# Patient Record
Sex: Male | Born: 1950 | Race: Black or African American | Hispanic: No | Marital: Married | Smoking: Former smoker
Health system: Southern US, Community
[De-identification: ages and names within clinical notes are randomized; demographics above are authoritative.]

## PROBLEM LIST (undated history)

## (undated) DIAGNOSIS — G4730 Sleep apnea, unspecified: Secondary | ICD-10-CM

## (undated) DIAGNOSIS — I209 Angina pectoris, unspecified: Secondary | ICD-10-CM

## (undated) DIAGNOSIS — Z951 Presence of aortocoronary bypass graft: Secondary | ICD-10-CM

## (undated) DIAGNOSIS — J449 Chronic obstructive pulmonary disease, unspecified: Secondary | ICD-10-CM

## (undated) DIAGNOSIS — I739 Peripheral vascular disease, unspecified: Secondary | ICD-10-CM

## (undated) DIAGNOSIS — N183 Chronic kidney disease, stage 3 unspecified: Secondary | ICD-10-CM

## (undated) DIAGNOSIS — M1990 Unspecified osteoarthritis, unspecified site: Secondary | ICD-10-CM

## (undated) DIAGNOSIS — G4733 Obstructive sleep apnea (adult) (pediatric): Secondary | ICD-10-CM

## (undated) DIAGNOSIS — J9610 Chronic respiratory failure, unspecified whether with hypoxia or hypercapnia: Secondary | ICD-10-CM

## (undated) DIAGNOSIS — N189 Chronic kidney disease, unspecified: Secondary | ICD-10-CM

## (undated) DIAGNOSIS — I509 Heart failure, unspecified: Secondary | ICD-10-CM

## (undated) DIAGNOSIS — I2510 Atherosclerotic heart disease of native coronary artery without angina pectoris: Secondary | ICD-10-CM

## (undated) DIAGNOSIS — G459 Transient cerebral ischemic attack, unspecified: Secondary | ICD-10-CM

## (undated) DIAGNOSIS — D649 Anemia, unspecified: Secondary | ICD-10-CM

## (undated) DIAGNOSIS — Z8739 Personal history of other diseases of the musculoskeletal system and connective tissue: Secondary | ICD-10-CM

## (undated) DIAGNOSIS — M7989 Other specified soft tissue disorders: Secondary | ICD-10-CM

## (undated) DIAGNOSIS — IMO0001 Reserved for inherently not codable concepts without codable children: Secondary | ICD-10-CM

## (undated) DIAGNOSIS — F419 Anxiety disorder, unspecified: Secondary | ICD-10-CM

## (undated) DIAGNOSIS — I5032 Chronic diastolic (congestive) heart failure: Secondary | ICD-10-CM

## (undated) DIAGNOSIS — J4489 Other specified chronic obstructive pulmonary disease: Secondary | ICD-10-CM

## (undated) DIAGNOSIS — E1159 Type 2 diabetes mellitus with other circulatory complications: Secondary | ICD-10-CM

## (undated) DIAGNOSIS — I279 Pulmonary heart disease, unspecified: Secondary | ICD-10-CM

## (undated) DIAGNOSIS — I10 Essential (primary) hypertension: Secondary | ICD-10-CM

## (undated) DIAGNOSIS — E785 Hyperlipidemia, unspecified: Secondary | ICD-10-CM

## (undated) DIAGNOSIS — K219 Gastro-esophageal reflux disease without esophagitis: Secondary | ICD-10-CM

## (undated) HISTORY — DX: Chronic respiratory failure, unspecified whether with hypoxia or hypercapnia: J96.10

## (undated) HISTORY — DX: Transient cerebral ischemic attack, unspecified: G45.9

## (undated) HISTORY — PX: CORONARY ANGIOPLASTY WITH STENT PLACEMENT: SHX49

## (undated) HISTORY — DX: Obstructive sleep apnea (adult) (pediatric): G47.33

## (undated) HISTORY — PX: OTHER SURGICAL HISTORY: SHX169

## (undated) HISTORY — DX: Atherosclerotic heart disease of native coronary artery without angina pectoris: I25.10

## (undated) HISTORY — DX: Other specified soft tissue disorders: M79.89

## (undated) HISTORY — DX: Chronic obstructive pulmonary disease, unspecified: J44.9

## (undated) HISTORY — PX: APPENDECTOMY: SHX54

## (undated) HISTORY — DX: Chronic kidney disease, stage 3 (moderate): N18.3

## (undated) HISTORY — PX: BILATERAL VATS ABLATION: SHX1224

## (undated) HISTORY — DX: Hyperlipidemia, unspecified: E78.5

## (undated) HISTORY — DX: Chronic kidney disease, stage 3 unspecified: N18.30

## (undated) HISTORY — DX: Other specified chronic obstructive pulmonary disease: J44.89

## (undated) HISTORY — DX: Presence of aortocoronary bypass graft: Z95.1

## (undated) HISTORY — DX: Pulmonary heart disease, unspecified: I27.9

## (undated) HISTORY — DX: Sleep apnea, unspecified: G47.30

## (undated) HISTORY — PX: CATARACT EXTRACTION: SUR2

## (undated) SURGERY — EGD (ESOPHAGOGASTRODUODENOSCOPY)
Anesthesia: Moderate Sedation

---

## 1998-07-26 ENCOUNTER — Emergency Department (HOSPITAL_COMMUNITY): Admission: EM | Admit: 1998-07-26 | Discharge: 1998-07-26 | Payer: Self-pay | Admitting: Emergency Medicine

## 1998-09-14 ENCOUNTER — Inpatient Hospital Stay (HOSPITAL_COMMUNITY): Admission: RE | Admit: 1998-09-14 | Discharge: 1998-09-17 | Payer: Self-pay | Admitting: *Deleted

## 1998-09-14 ENCOUNTER — Encounter: Payer: Self-pay | Admitting: *Deleted

## 1998-09-15 ENCOUNTER — Encounter: Payer: Self-pay | Admitting: Internal Medicine

## 1999-03-21 ENCOUNTER — Encounter: Admission: RE | Admit: 1999-03-21 | Discharge: 1999-06-19 | Payer: Self-pay | Admitting: Unknown Physician Specialty

## 2000-06-28 ENCOUNTER — Ambulatory Visit (HOSPITAL_COMMUNITY): Admission: RE | Admit: 2000-06-28 | Discharge: 2000-06-28 | Payer: Self-pay | Admitting: Cardiology

## 2000-06-28 HISTORY — PX: CARDIAC CATHETERIZATION: SHX172

## 2000-07-10 ENCOUNTER — Encounter: Payer: Self-pay | Admitting: Thoracic Surgery (Cardiothoracic Vascular Surgery)

## 2000-07-12 ENCOUNTER — Encounter: Payer: Self-pay | Admitting: Thoracic Surgery (Cardiothoracic Vascular Surgery)

## 2000-07-12 ENCOUNTER — Inpatient Hospital Stay (HOSPITAL_COMMUNITY)
Admission: RE | Admit: 2000-07-12 | Discharge: 2000-07-20 | Payer: Self-pay | Admitting: Thoracic Surgery (Cardiothoracic Vascular Surgery)

## 2000-07-12 HISTORY — PX: CORONARY ARTERY BYPASS GRAFT: SHX141

## 2000-07-13 ENCOUNTER — Encounter: Payer: Self-pay | Admitting: Thoracic Surgery (Cardiothoracic Vascular Surgery)

## 2000-07-14 ENCOUNTER — Encounter: Payer: Self-pay | Admitting: Cardiology

## 2000-07-14 ENCOUNTER — Encounter: Payer: Self-pay | Admitting: Thoracic Surgery (Cardiothoracic Vascular Surgery)

## 2000-07-15 ENCOUNTER — Encounter: Payer: Self-pay | Admitting: Surgery

## 2000-08-15 ENCOUNTER — Encounter: Payer: Self-pay | Admitting: Thoracic Surgery (Cardiothoracic Vascular Surgery)

## 2000-08-15 ENCOUNTER — Encounter
Admission: RE | Admit: 2000-08-15 | Discharge: 2000-08-15 | Payer: Self-pay | Admitting: Thoracic Surgery (Cardiothoracic Vascular Surgery)

## 2000-08-21 ENCOUNTER — Encounter (HOSPITAL_COMMUNITY): Admission: RE | Admit: 2000-08-21 | Discharge: 2000-11-16 | Payer: Self-pay | Admitting: Cardiology

## 2000-08-29 ENCOUNTER — Encounter: Payer: Self-pay | Admitting: Thoracic Surgery (Cardiothoracic Vascular Surgery)

## 2000-08-29 ENCOUNTER — Encounter
Admission: RE | Admit: 2000-08-29 | Discharge: 2000-08-29 | Payer: Self-pay | Admitting: Thoracic Surgery (Cardiothoracic Vascular Surgery)

## 2001-05-08 ENCOUNTER — Ambulatory Visit (HOSPITAL_BASED_OUTPATIENT_CLINIC_OR_DEPARTMENT_OTHER): Admission: RE | Admit: 2001-05-08 | Discharge: 2001-05-08 | Payer: Self-pay | Admitting: Internal Medicine

## 2001-09-06 ENCOUNTER — Ambulatory Visit (HOSPITAL_BASED_OUTPATIENT_CLINIC_OR_DEPARTMENT_OTHER): Admission: RE | Admit: 2001-09-06 | Discharge: 2001-09-06 | Payer: Self-pay | Admitting: Internal Medicine

## 2001-09-17 ENCOUNTER — Encounter: Admission: RE | Admit: 2001-09-17 | Discharge: 2001-09-17 | Payer: Self-pay | Admitting: Family Medicine

## 2001-09-17 ENCOUNTER — Encounter: Payer: Self-pay | Admitting: Family Medicine

## 2001-10-15 ENCOUNTER — Encounter: Payer: Self-pay | Admitting: Pulmonary Disease

## 2001-10-15 ENCOUNTER — Ambulatory Visit (HOSPITAL_COMMUNITY): Admission: RE | Admit: 2001-10-15 | Discharge: 2001-10-15 | Payer: Self-pay | Admitting: Pulmonary Disease

## 2001-11-26 ENCOUNTER — Ambulatory Visit: Admission: RE | Admit: 2001-11-26 | Discharge: 2001-11-26 | Payer: Self-pay | Admitting: Pulmonary Disease

## 2001-12-17 ENCOUNTER — Ambulatory Visit (HOSPITAL_COMMUNITY): Admission: RE | Admit: 2001-12-17 | Discharge: 2001-12-17 | Payer: Self-pay | Admitting: Pulmonary Disease

## 2001-12-17 ENCOUNTER — Encounter: Payer: Self-pay | Admitting: Pulmonary Disease

## 2002-03-12 ENCOUNTER — Encounter: Admission: RE | Admit: 2002-03-12 | Discharge: 2002-03-12 | Payer: Self-pay | Admitting: Family Medicine

## 2002-03-12 ENCOUNTER — Encounter: Payer: Self-pay | Admitting: Family Medicine

## 2002-05-21 ENCOUNTER — Encounter: Payer: Self-pay | Admitting: Orthopedic Surgery

## 2002-05-21 ENCOUNTER — Encounter: Admission: RE | Admit: 2002-05-21 | Discharge: 2002-05-21 | Payer: Self-pay | Admitting: Orthopedic Surgery

## 2002-06-09 ENCOUNTER — Encounter: Payer: Self-pay | Admitting: Orthopedic Surgery

## 2002-06-09 ENCOUNTER — Encounter: Admission: RE | Admit: 2002-06-09 | Discharge: 2002-06-09 | Payer: Self-pay | Admitting: Orthopedic Surgery

## 2002-06-19 ENCOUNTER — Encounter: Admission: RE | Admit: 2002-06-19 | Discharge: 2002-09-17 | Payer: Self-pay | Admitting: Pulmonary Disease

## 2002-12-01 ENCOUNTER — Encounter: Payer: Self-pay | Admitting: Vascular Surgery

## 2002-12-03 ENCOUNTER — Ambulatory Visit (HOSPITAL_COMMUNITY): Admission: RE | Admit: 2002-12-03 | Discharge: 2002-12-03 | Payer: Self-pay | Admitting: Vascular Surgery

## 2002-12-03 HISTORY — PX: OTHER SURGICAL HISTORY: SHX169

## 2002-12-05 ENCOUNTER — Encounter: Payer: Self-pay | Admitting: Vascular Surgery

## 2002-12-05 ENCOUNTER — Encounter: Admission: RE | Admit: 2002-12-05 | Discharge: 2002-12-05 | Payer: Self-pay | Admitting: Vascular Surgery

## 2003-05-07 ENCOUNTER — Emergency Department (HOSPITAL_COMMUNITY): Admission: AD | Admit: 2003-05-07 | Discharge: 2003-05-08 | Payer: Self-pay | Admitting: Emergency Medicine

## 2003-05-08 ENCOUNTER — Encounter: Payer: Self-pay | Admitting: Emergency Medicine

## 2003-08-15 HISTORY — PX: OTHER SURGICAL HISTORY: SHX169

## 2003-08-26 ENCOUNTER — Ambulatory Visit (HOSPITAL_BASED_OUTPATIENT_CLINIC_OR_DEPARTMENT_OTHER): Admission: RE | Admit: 2003-08-26 | Discharge: 2003-08-26 | Payer: Self-pay | Admitting: Pulmonary Disease

## 2003-10-23 ENCOUNTER — Encounter: Admission: RE | Admit: 2003-10-23 | Discharge: 2003-10-23 | Payer: Self-pay | Admitting: Family Medicine

## 2004-06-27 ENCOUNTER — Ambulatory Visit: Payer: Self-pay | Admitting: Critical Care Medicine

## 2004-06-27 ENCOUNTER — Encounter (INDEPENDENT_AMBULATORY_CARE_PROVIDER_SITE_OTHER): Payer: Self-pay | Admitting: Cardiology

## 2004-06-27 ENCOUNTER — Inpatient Hospital Stay (HOSPITAL_COMMUNITY): Admission: EM | Admit: 2004-06-27 | Discharge: 2004-07-06 | Payer: Self-pay | Admitting: Emergency Medicine

## 2004-07-30 ENCOUNTER — Emergency Department (HOSPITAL_COMMUNITY): Admission: EM | Admit: 2004-07-30 | Discharge: 2004-07-31 | Payer: Self-pay | Admitting: Emergency Medicine

## 2004-08-05 ENCOUNTER — Ambulatory Visit (HOSPITAL_COMMUNITY): Admission: RE | Admit: 2004-08-05 | Discharge: 2004-08-06 | Payer: Self-pay | Admitting: Cardiovascular Disease

## 2004-08-05 HISTORY — PX: CARDIAC CATHETERIZATION: SHX172

## 2004-09-13 ENCOUNTER — Ambulatory Visit: Payer: Self-pay | Admitting: Internal Medicine

## 2004-10-17 ENCOUNTER — Ambulatory Visit: Payer: Self-pay | Admitting: Internal Medicine

## 2005-06-08 ENCOUNTER — Ambulatory Visit: Payer: Self-pay | Admitting: Internal Medicine

## 2005-06-21 ENCOUNTER — Ambulatory Visit: Payer: Self-pay | Admitting: Internal Medicine

## 2005-06-26 ENCOUNTER — Ambulatory Visit: Payer: Self-pay | Admitting: Cardiology

## 2005-07-10 ENCOUNTER — Ambulatory Visit: Payer: Self-pay | Admitting: Internal Medicine

## 2005-07-15 ENCOUNTER — Emergency Department (HOSPITAL_COMMUNITY): Admission: EM | Admit: 2005-07-15 | Discharge: 2005-07-16 | Payer: Self-pay | Admitting: Emergency Medicine

## 2005-08-21 ENCOUNTER — Ambulatory Visit: Payer: Self-pay | Admitting: Internal Medicine

## 2005-11-11 ENCOUNTER — Encounter: Payer: Self-pay | Admitting: Emergency Medicine

## 2005-11-12 ENCOUNTER — Inpatient Hospital Stay (HOSPITAL_COMMUNITY): Admission: EM | Admit: 2005-11-12 | Discharge: 2005-11-18 | Payer: Self-pay | Admitting: Cardiovascular Disease

## 2005-11-17 ENCOUNTER — Encounter (INDEPENDENT_AMBULATORY_CARE_PROVIDER_SITE_OTHER): Payer: Self-pay | Admitting: *Deleted

## 2005-11-18 ENCOUNTER — Ambulatory Visit: Payer: Self-pay | Admitting: Internal Medicine

## 2005-12-08 ENCOUNTER — Ambulatory Visit: Payer: Self-pay | Admitting: Internal Medicine

## 2006-04-25 ENCOUNTER — Ambulatory Visit: Payer: Self-pay | Admitting: Internal Medicine

## 2006-08-27 ENCOUNTER — Ambulatory Visit: Payer: Self-pay | Admitting: Internal Medicine

## 2006-12-25 ENCOUNTER — Ambulatory Visit: Payer: Self-pay | Admitting: Internal Medicine

## 2007-04-05 ENCOUNTER — Emergency Department (HOSPITAL_COMMUNITY): Admission: EM | Admit: 2007-04-05 | Discharge: 2007-04-05 | Payer: Self-pay | Admitting: Emergency Medicine

## 2007-04-29 ENCOUNTER — Ambulatory Visit: Payer: Self-pay | Admitting: Internal Medicine

## 2007-10-22 ENCOUNTER — Telehealth (INDEPENDENT_AMBULATORY_CARE_PROVIDER_SITE_OTHER): Payer: Self-pay | Admitting: *Deleted

## 2007-10-28 ENCOUNTER — Encounter: Payer: Self-pay | Admitting: Internal Medicine

## 2007-10-28 ENCOUNTER — Ambulatory Visit: Payer: Self-pay | Admitting: Internal Medicine

## 2007-10-28 DIAGNOSIS — J9612 Chronic respiratory failure with hypercapnia: Secondary | ICD-10-CM

## 2007-10-28 DIAGNOSIS — J449 Chronic obstructive pulmonary disease, unspecified: Secondary | ICD-10-CM | POA: Insufficient documentation

## 2007-10-28 DIAGNOSIS — I5032 Chronic diastolic (congestive) heart failure: Secondary | ICD-10-CM

## 2007-10-28 DIAGNOSIS — G4733 Obstructive sleep apnea (adult) (pediatric): Secondary | ICD-10-CM

## 2007-10-28 DIAGNOSIS — I279 Pulmonary heart disease, unspecified: Secondary | ICD-10-CM | POA: Insufficient documentation

## 2007-10-28 DIAGNOSIS — I2510 Atherosclerotic heart disease of native coronary artery without angina pectoris: Secondary | ICD-10-CM

## 2007-10-28 DIAGNOSIS — J9611 Chronic respiratory failure with hypoxia: Secondary | ICD-10-CM

## 2007-10-28 HISTORY — DX: Obstructive sleep apnea (adult) (pediatric): G47.33

## 2007-10-28 HISTORY — DX: Chronic diastolic (congestive) heart failure: I50.32

## 2008-02-27 ENCOUNTER — Inpatient Hospital Stay (HOSPITAL_COMMUNITY): Admission: EM | Admit: 2008-02-27 | Discharge: 2008-02-27 | Payer: Self-pay | Admitting: Emergency Medicine

## 2008-03-12 ENCOUNTER — Telehealth (INDEPENDENT_AMBULATORY_CARE_PROVIDER_SITE_OTHER): Payer: Self-pay | Admitting: *Deleted

## 2008-03-12 ENCOUNTER — Encounter: Payer: Self-pay | Admitting: Internal Medicine

## 2008-04-16 ENCOUNTER — Observation Stay (HOSPITAL_COMMUNITY): Admission: RE | Admit: 2008-04-16 | Discharge: 2008-04-17 | Payer: Self-pay | Admitting: Cardiovascular Disease

## 2008-04-16 HISTORY — PX: PTCA: SHX146

## 2008-04-27 ENCOUNTER — Ambulatory Visit: Payer: Self-pay | Admitting: Internal Medicine

## 2008-08-17 ENCOUNTER — Telehealth (INDEPENDENT_AMBULATORY_CARE_PROVIDER_SITE_OTHER): Payer: Self-pay | Admitting: *Deleted

## 2008-09-07 ENCOUNTER — Ambulatory Visit: Payer: Self-pay | Admitting: Internal Medicine

## 2008-11-03 ENCOUNTER — Encounter: Admission: RE | Admit: 2008-11-03 | Discharge: 2008-11-03 | Payer: Self-pay | Admitting: Family Medicine

## 2009-01-05 ENCOUNTER — Ambulatory Visit: Payer: Self-pay | Admitting: Internal Medicine

## 2009-01-05 DIAGNOSIS — T783XXA Angioneurotic edema, initial encounter: Secondary | ICD-10-CM | POA: Insufficient documentation

## 2009-03-24 ENCOUNTER — Ambulatory Visit: Payer: Self-pay | Admitting: Internal Medicine

## 2009-03-24 ENCOUNTER — Inpatient Hospital Stay (HOSPITAL_COMMUNITY): Admission: EM | Admit: 2009-03-24 | Discharge: 2009-03-30 | Payer: Self-pay | Admitting: Emergency Medicine

## 2009-03-25 ENCOUNTER — Encounter (INDEPENDENT_AMBULATORY_CARE_PROVIDER_SITE_OTHER): Payer: Self-pay | Admitting: Cardiovascular Disease

## 2009-03-29 HISTORY — PX: CARDIAC CATHETERIZATION: SHX172

## 2009-05-07 ENCOUNTER — Ambulatory Visit: Payer: Self-pay | Admitting: Internal Medicine

## 2009-09-06 ENCOUNTER — Ambulatory Visit: Payer: Self-pay | Admitting: Internal Medicine

## 2009-09-13 ENCOUNTER — Telehealth: Payer: Self-pay | Admitting: Internal Medicine

## 2009-09-14 ENCOUNTER — Telehealth: Payer: Self-pay | Admitting: Internal Medicine

## 2009-12-17 ENCOUNTER — Telehealth: Payer: Self-pay | Admitting: Internal Medicine

## 2009-12-29 ENCOUNTER — Inpatient Hospital Stay (HOSPITAL_COMMUNITY): Admission: EM | Admit: 2009-12-29 | Discharge: 2009-12-31 | Payer: Self-pay | Admitting: Emergency Medicine

## 2009-12-29 ENCOUNTER — Encounter (INDEPENDENT_AMBULATORY_CARE_PROVIDER_SITE_OTHER): Payer: Self-pay | Admitting: Internal Medicine

## 2009-12-30 ENCOUNTER — Encounter: Payer: Self-pay | Admitting: Internal Medicine

## 2010-01-04 ENCOUNTER — Ambulatory Visit: Payer: Self-pay | Admitting: Internal Medicine

## 2010-01-14 ENCOUNTER — Encounter: Payer: Self-pay | Admitting: Internal Medicine

## 2010-02-15 ENCOUNTER — Telehealth (INDEPENDENT_AMBULATORY_CARE_PROVIDER_SITE_OTHER): Payer: Self-pay | Admitting: *Deleted

## 2010-02-16 ENCOUNTER — Inpatient Hospital Stay (HOSPITAL_COMMUNITY): Admission: RE | Admit: 2010-02-16 | Discharge: 2010-02-17 | Payer: Self-pay | Admitting: Cardiovascular Disease

## 2010-02-16 HISTORY — PX: CARDIAC CATHETERIZATION: SHX172

## 2010-02-24 ENCOUNTER — Ambulatory Visit: Payer: Self-pay | Admitting: Internal Medicine

## 2010-03-23 ENCOUNTER — Ambulatory Visit: Payer: Self-pay | Admitting: Internal Medicine

## 2010-07-21 ENCOUNTER — Ambulatory Visit: Payer: Self-pay | Admitting: Internal Medicine

## 2010-09-13 NOTE — Progress Notes (Signed)
Summary: returned call  Phone Note Call from Patient   Caller: Patient Call For: [name] [name] Summary of Call: Returning your phone call. Initial call taken by: [name] [name],  September 14, 2009 10:56 AM  Follow-up for Phone Call        Pt aware of CXR results; see append for more information.[name] [name] CMA  September 14, 2009 1:41 PM

## 2010-09-13 NOTE — Assessment & Plan Note (Signed)
Summary: rov/test results/apc   Primary Provider/Referring Provider:  Alwyn Pea  CC:  Follow up visit-review PFT results and having dry mouth with BIPAP.  History of Present Illness: September 06, 2009- COPD, diastolic dysfunction, Chronic respiratory failure.  No major events since last here. Has been losing a little weight. Notices pains low back and flanks with standing, lying down and position changes. Has to sit up to sleep. Aching pain in low back, then panting dyspnea limit sleeping supine. Also c/o mouth dry. Legs not swelling much. Had flu shot.  Jan 04, 2010- COPD, diastolic dysfunction, Chronic respiratory failure Post hospital f/u- Dyspnea attributed to CHF, COPD, anemia and OSA. Feels some better, but doesn't feel strong or energetic.CXR 12/28/09- CHF.Allthough BNP was <50. V/Q scan- low probability. He needed transfusion 2 units RBCs and will follow up on that with Dr Daphine Deutscher tomorrow. Today breathing is "about the same" with easy dyspnea and pacing himself for stairs or brisk walking. Sometimes mid anterior chest discomfort when he starts walking, relieved by pacing himself. Home O2 set on 3 or 3.5 L. He sets portable lower, 2-3. Rarely hears a little wheeze.  March 23, 2010- COPD, diastolic dysfunction, Chronic respiratory failure Can't sleep lying down more than a few hours with BiPAP. Complains it is drying his mouth, but had not been bothering to use the humidifier with it. Today woke with cough, clear phlgem. Denies sore throat or fever. Says it was the same July 5 when he respnded to doxycylcline. He has been turning oxygen up so he can sleep- running it at 4-6 when active. PFT- 02/24/10- severe restriction/ FVC 1.51. Mild to moderate obstruction/ FEV1 1.11/ 31%; DLCO severely reduced 37%/ corrects for volume.   Preventive Screening-Counseling & Management  Alcohol-Tobacco     Smoking Status: quit     Year Started: 1980     Year Quit: 2000  Current Medications  (verified): 1)  Plavix 75 Mg  Tabs (Clopidogrel Bisulfate) .... Once Daily 2)  Furosemide 80 Mg  Tabs (Furosemide) .... Two Times A Day 3)  Crestor 20 Mg  Tabs (Rosuvastatin Calcium) .... Once Daily 4)  Klor-Con 20 Meq  Pack (Potassium Chloride) .... Once Daily 5)  Metolazone 2.5 Mg  Tabs (Metolazone) .... Once Daily 6)  Adult Aspirin Ec Low Strength 81 Mg  Tbec (Aspirin) .... Once Daily 7)  Centrum   Chew (Multiple Vitamins-Minerals) .... Once Daily 8)  Oxygen 3 L/m Advanced 9)  Bipap I =15/ E =12 10)  Combivent 18-103 Mcg/act Aero (Ipratropium-Albuterol) .Marland Kitchen.. 1 Puff 4 Times Per Day As Needed 11)  Janumet 50-500 Mg Tabs (Sitagliptin-Metformin Hcl) .Marland Kitchen.. 1 Once Daily 12)  Nu-Iron 150 Mg Caps (Polysaccharide Iron Complex) .... Take 1 Tablet By Mouth Once A Day 13)  Nexium 40 Mg Cpdr (Esomeprazole Magnesium) .... Take 1 By Mouth Once Daily 14)  Losartan Potassium 50 Mg Tabs (Losartan Potassium) .... Take 1 By Mouth Once Daily 15)  Miralax  Powd (Polyethylene Glycol 3350) .... Once Daily 16)  Xanax 1 Mg Tabs (Alprazolam) .... Take 1 By Mouth Three Times A Day As Needed 17)  Imdur 60 Mg Xr24h-Tab (Isosorbide Mononitrate) .... Take 1/2 By Mouth Once Daily 18)  Meloxicam 15 Mg Tabs (Meloxicam) .... Take 1 By Mouth Once Daily 19)  Proair Hfa 108 (90 Base) Mcg/act Aers (Albuterol Sulfate) .... 2 Puffs Every 4 Hours As Needed 20)  Nitrostat 0.4 Mg Subl (Nitroglycerin) .... Use As Directed As Needed  Allergies (verified): 1)  ! 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 MI Sister- DM  Social History: Last updated: 10/28/2007 Patient states former smoker.   Risk Factors: Smoking Status: quit (03/23/2010)  Past Pulmonary History:  Pulmonary History:  03/1009 DISCHARGE DIAGNOSES: 1. Unstable angina associated with shortness of breath and fatigue. 2. Coronary artery disease with history of bypass grafts, now with     patent grafts except saphenous vein graft to the right coronary     artery, undergoing percutaneous transluminal coronary angioplasty     and stent with a drug-eluting Xience stent and staged during this     admission, percutaneous coronary intervention to the circumflex     artery and stent with a Xience drug-eluting stent. 3. Ongoing bleeding, post initial procedure with stopping of     Integrilin 3-4 hours earlier than originally planned. 4. Blood loss anemia.  We will monitor as an outpatient. 5. Renal sufficiency, currently stable. 6. Chronic obstructive pulmonary disease, on home oxygen. 7. Pulmonary fibrosis. 8. Diastolic dysfunction. 9. Obstructive sleep apnea, on BiPAP. 10.Normal ejection fraction. 11.Diabetes mellitus type 2, stable. 12.Hypotension due to dehydration with holding diuretics and treating     with saline bolus.  Resolved at discharge. 13.Episodic thrombocytopenia.   DISCHARGE CONDITION:  Improved.    Review of Systems      See HPI       The patient complains of shortness of breath with activity, shortness of breath at rest, tooth/dental problems, and sneezing.  The patient denies productive cough, non-productive cough, coughing up blood, chest pain, irregular heartbeats, acid heartburn, indigestion, loss of appetite, weight change, abdominal pain, difficulty swallowing, sore throat, headaches, and nasal congestion/difficulty breathing through nose.         Broken tooth Sneezes and nose runs.  Vital Signs:  Patient profile:   60 year old male Height:      73  inches Weight:      318.13 pounds BMI:     42.12 O2 Sat:      96 % on 3 L/min Pulse rate:   103 / minute BP sitting:   120 / 60  (left arm) Cuff size:   large  Vitals Entered By: Reynaldo Minium CMA (March 23, 2010 11:24 AM)  O2 Flow:  3 L/min CC: Follow up visit-review PFT results and having dry mouth with BIPAP   Physical Exam  Additional Exam:  General: A/Ox3; pleasant and cooperative, NAD,obese, supplemental oxygen with demand regulator-3 L. Arrived 96%  SKIN: no rash. Birthmark left ear NODES: no lymphadenopathy HEENT: [place]/AT, EOM- WNL, Conjuctivae- clear, PERRLA, TM-WNL, Nose- clear, Throat- clear and wnl- . Voice normal NECK: Supple w/ fair ROM, JVD- none, normal carotid impulses w/o bruits Thyroid-  CHEST-Diminished, wheeze, poor airflow, unlabored HEART: RRR, no m/g/r heard ABDOMEN: obese WJX:BJYN, nl pulses,minimal edema NEURO: Grossly intact to observation    .    Marland Kitchen      Impression & Recommendations:  Problem # 1:  RESPIRATORY FAILURE, CHRONIC (ICD-518.83) Severe COPD with CO2 retention. I will get ONOX on 4 l, but tell him it is ok to use oxygen at 4L with sleep or exertion.  Problem # 2:  SLEEP APNEA (ICD-780.57)  He uses BiPAP for OSA and for respiratory failure, and has been compliant. He needs to ask the DME company how to use the humidifier for better comfort.  Problem # 3:  OBESITY HYPOVENTILATION SYNDROME (ICD-278.8) Weight loss levels of exertion may be out of reach for him, but weight loss would really help his restrictive pattern and allow easier breathing as discussed.  Medications Added to Medication List This Visit: 1)  Nexium 40 Mg Cpdr (Esomeprazole magnesium) .... Take 1 by mouth once daily 2)  Xanax 1 Mg Tabs (Alprazolam) .... Take 1 by mouth three times a day as needed 3)  Nitrostat 0.4 Mg Subl (Nitroglycerin) .... Use as directed as needed  Other Orders: Est. Patient Level IV (16109)  Patient Instructions: 1)  Please schedule a  follow-up appointment in 4 months. 2)  Please continue BiPAP at night at 15 inspiration/ 12 expiration. ask the homecare company how to use the humidifer on your machine to help with the dry mouth. 3)  You can turn oxygen up to 4 if needed.See PCC to set up overnight oximetry check on 4 L oxygen during sleep. 4)  Ask your druggist for Biotine to help with dry mouth.

## 2010-09-13 NOTE — Assessment & Plan Note (Signed)
Summary: rov 4 months///kp   Primary Provider/Referring Provider:  Alwyn Pea  CC:  4 month follow up visit-sleep; sleeping better without BIPAP than with it.[name] [name]  History of Present Illness:  Jan 04, 2010- COPD, diastolic dysfunction, Chronic respiratory failure Post hospital f/u- Dyspnea attributed to CHF, COPD, anemia and OSA. Feels some better, but doesn't feel strong or energetic.CXR 12/28/09- CHF.Allthough BNP was <50. V/Q scan- low probability. He needed transfusion 2 units RBCs and will follow up on that with Dr Daphine Deutscher tomorrow. Today breathing is "about the same" with easy dyspnea and pacing himself for stairs or brisk walking. Sometimes mid anterior chest discomfort when he starts walking, relieved by pacing himself. Home O2 set on 3 or 3.5 L. He sets portable lower, 2-3. Rarely hears a little wheeze.  March 23, 2010- COPD, diastolic dysfunction, Chronic respiratory failure Can't sleep lying down more than a few hours with BiPAP. Complains it is drying his mouth, but had not been bothering to use the humidifier with it. Today woke with cough, clear phlgem. Denies sore throat or fever. Says it was the same July 5 when he respnded to doxycylcline. He has been turning oxygen up so he can sleep- running it at 4-6 when active. PFT- 02/24/10- severe restriction/ FVC 1.51. Mild to moderate obstruction/ FEV1 1.11/ 31%; DLCO severely reduced 37%/ corrects for volume.  July 21, 2010- COPD, diastolic dysfunction, Chronic respiratory failure Nurse-CC: 4 month follow up visit-sleep; sleeping better without BIPAP than with it. No major events since last here. Mentions an indigestion like sensation under xiphoid. May noice if climbing stairs, relieved by rest, not progressive or radiating. He will mention it to Dr Allyson Sabal at upcoming visit.  Notes some wheeze and occ white phlegm. No change in chronic DOE and stays on O2 3 L.  BiPAP makes his mouth too dry and there is now some pressure into head  such that he sleeps better without it. Feet swell less.    Preventive Screening-Counseling & Management  Alcohol-Tobacco     Smoking Status: quit     Year Started: 1980     Year Quit: 2000  Current Medications (verified): 1)  Plavix 75 Mg  Tabs (Clopidogrel Bisulfate) .... Once Daily 2)  Furosemide 80 Mg  Tabs (Furosemide) .... Two Times A Day 3)  Crestor 20 Mg  Tabs (Rosuvastatin Calcium) .... Once Daily 4)  Klor-Con 20 Meq  Pack (Potassium Chloride) .... Once Daily 5)  Metolazone 2.5 Mg  Tabs (Metolazone) .... Once Daily 6)  Adult Aspirin Ec Low Strength 81 Mg  Tbec (Aspirin) .... Once Daily 7)  Centrum   Chew (Multiple Vitamins-Minerals) .... Once Daily 8)  Oxygen 3 L/m Advanced 9)  Bipap I =15/ E =12 10)  Janumet 50-500 Mg Tabs (Sitagliptin-Metformin Hcl) .[name] [name].. 1 Once Daily 11)  Nu-Iron 150 Mg Caps (Polysaccharide Iron Complex) .... Take 1 Tablet By Mouth Once A Day 12)  Nexium 40 Mg Cpdr (Esomeprazole Magnesium) .... Take 1 By Mouth Once Daily 13)  Losartan Potassium 50 Mg Tabs (Losartan Potassium) .... Take 1 By Mouth Once Daily 14)  Miralax  Powd (Polyethylene Glycol 3350) .... Once Daily 15)  Xanax 1 Mg Tabs (Alprazolam) .... Take 1 By Mouth Three Times A Day As Needed 16)  Imdur 60 Mg Xr24h-Tab (Isosorbide Mononitrate) .... Take 1/2 By Mouth Once Daily 17)  Meloxicam 15 Mg Tabs (Meloxicam) .... Take 1 By Mouth Once Daily 18)  Proair Hfa 108 (90 Base) Mcg/act Aers (Albuterol Sulfate) .[name] KitchenMarland KitchenMarland [name]  2 Puffs Every 4 Hours As Needed 19)  Nitrostat 0.4 Mg Subl (Nitroglycerin) .... Use As Directed As Needed  Allergies (verified): 1)  ! 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 MI Sister- DM  Social History: Last updated: 10/28/2007 Patient states former smoker.   Risk Factors: Smoking Status: quit (07/21/2010)  Past Pulmonary History:  Pulmonary History:  03/1009 DISCHARGE DIAGNOSES: 1. Unstable angina associated with shortness of breath and fatigue. 2. Coronary artery disease with history of bypass grafts, now with     patent grafts except saphenous vein graft to the right coronary     artery, undergoing percutaneous transluminal coronary angioplasty     and stent with a drug-eluting Xience stent and staged during this     admission, percutaneous coronary intervention to the circumflex     artery and stent with a Xience drug-eluting stent. 3. Ongoing bleeding, post initial procedure with stopping of     Integrilin 3-4 hours earlier than originally planned. 4. Blood loss anemia.  We will monitor as an outpatient. 5. Renal sufficiency, currently stable. 6. Chronic obstructive pulmonary disease, on home oxygen. 7. Pulmonary fibrosis. 8. Diastolic dysfunction. 9. Obstructive sleep apnea, on BiPAP. 10.Normal ejection fraction. 11.Diabetes mellitus type 2, stable. 12.Hypotension due to dehydration with holding diuretics and treating     with saline bolus.  Resolved at discharge. 13.Episodic thrombocytopenia.   DISCHARGE CONDITION:  Improved.    Review of Systems      See HPI       The patient complains of shortness of breath with activity, shortness of breath at rest, and chest pain.  The patient denies productive cough, non-productive cough, coughing up blood, irregular heartbeats, acid heartburn, indigestion, loss of appetite, weight change, abdominal pain, difficulty swallowing, sore throat, tooth/dental problems, headaches, nasal congestion/difficulty breathing through nose, sneezing,  itching, ear ache, rash, change in color of mucus, and fever.    Vital Signs:  Patient profile:   60 year old male Height:      73 inches Weight:      305.25 pounds BMI:     40.42 O2 Sat:      91 % on 2.5 L/min Pulse rate:   76 / minute BP sitting:   110 / 68  (left arm) Cuff size:   large  Vitals Entered By: Reynaldo Minium CMA (July 21, 2010 2:30 PM)  O2 Flow:  2.5 L/min CC: 4 month follow up visit-sleep; sleeping better without BIPAP than with it.   Physical Exam  Additional Exam:  General: A/Ox3; pleasant and cooperative, NAD,obese, supplemental oxygen with demand regulator-2.5 L/M L. Arrived 91% sat. SKIN: no rash. Birthmark left ear NODES: no lymphadenopathy HEENT:  [place]/AT, EOM- WNL, Conjuctivae- clear, PERRLA, TM-WNL, Nose- clear, Throat- clear and wnl- . Voice normal, Mallampati  II NECK: Supple w/ fair ROM, JVD- none, normal carotid impulses w/o bruits Thyroid-  CHEST-Clear unlabored HEART: RRR, no m/g/r heard ABDOMEN: obese ZOX:WRUE, nl pulses,minimal edema NEURO: Grossly intact to observation    .    [name] [name]      Impression & Recommendations:  Problem # 1:  RESPIRATORY FAILURE, CHRONIC (  UVO-536.64)  His BiPAP is drying him too much, and the pressure may be too high. . We will reduce the pressure and have discussed the humidifer. He looks as if he has lost some weight, which is good. He remains oxygen dependent.   Problem # 2:  CORONARY ARTERY DISEASE (ICD-414.00) He is describing a nonradiating xiphoid pressure discomfort with exertion which I asked him to discuss as possible angina with Dr Allyson Sabal at his next cardiology visit, which is pending soon.  His updated medication list for this problem includes:    Plavix 75 Mg Tabs (Clopidogrel bisulfate) ..... Once daily    Furosemide 80 Mg Tabs (Furosemide) .[name] [name]..[name] [name] Two times a day    Metolazone 2.5 Mg Tabs (Metolazone) ..... Once daily    Adult Aspirin Ec Low Strength 81 Mg Tbec (Aspirin) ..... Once daily    Losartan  Potassium 50 Mg Tabs (Losartan potassium) .[name] [name]... Take 1 by mouth once daily    Imdur 60 Mg Xr24h-tab (Isosorbide mononitrate) .[name] [name]... Take 1/2 by mouth once daily    Nitrostat 0.4 Mg Subl (Nitroglycerin) ..... Use as directed as needed  Medications Added to Medication List This Visit: 1)  Bipap I =13/ E =10  .... Advanced  Other Orders: Est. Patient Level IV (40347) DME Referral (DME)  Patient Instructions: 1)  Please schedule a follow-up appointment in 6 months.  Please call sooner as needed.  2)  We will have [facility] reduce your BIPAP pressure to 13/10. If that is uncomfortable please let me or [facility] know.  3)  Be sure to tell Dr Allyson Sabal about your chest pain. If the pain gets worse, call him early.  4)  cc Dr Allyson Sabal,

## 2010-09-13 NOTE — Progress Notes (Signed)
Summary: waiting for rx  Phone Note Call from Patient Call back at Home Phone [phone]   Caller: Patient Call For: Ligaya Cormier Summary of Call: pt says he spoke to someone yesterday and understood that he was to have combivent rx called in to [facility] on [address].  Initial call taken by: Tivis Ringer, CNA,  Dec 17, 2009 2:20 PM  Follow-up for Phone Call        called and spoke with pt and he stated that he spoke with the pharmacy yesterday and not anyone from this office.  he just needs refills of his inhaler---proventil.  this has been refilled and sent to pts pharmacy.  pt is aware of refills. Randell Loop CMA  Dec 17, 2009 2:27 PM     Prescriptions: PROVENTIL HFA 108 (90 BASE) MCG/ACT AERS (ALBUTEROL SULFATE) 2 puffs every 4 hours as needed  #1 x 4   Entered by:   Randell Loop CMA   Authorized by:   Waymon Budge MD   Signed by:   Randell Loop CMA on 12/17/2009   Method used:   Electronically to        [facility] (retail)       [address]       [place], [place]  [address]       Ph: [phone]       Fax: [phone]   RxID:   [id number]

## 2010-09-13 NOTE — Miscellaneous (Signed)
Summary: Orders Update pft charges  Clinical Lists Changes  Orders: Added new Service order of Carbon Monoxide diffusing w/capacity (94720) - Signed Added new Service order of Lung Volumes (94240) - Signed Added new Service order of Spirometry (Pre & Post) (94060) - Signed 

## 2010-09-13 NOTE — Progress Notes (Signed)
Summary: rx  Phone Note Call from Patient Call back at [phone] or [phone]   Caller: Patient Call For: young Reason for Call: Talk to Nurse Summary of Call: Need rx for Combivent called in.  Pt also coughing up light colored plegm, real thick, blowing out bloody mucous. [facility] [address] Initial call taken by: Eugene Gavia,  February 15, 2010 2:52 PM  Follow-up for Phone Call        called spoke with patient who c/o prod cough with thick white mucus, heaviness in chest, increased edema in legs, and sinus congestion.  per patient was given abx by PCP and finished yesterday.  called spoke with pt's pharmacy: was given prednisone 20mg  taper, zpak and promethzine cough syrup on 7.1.11.  please advise, thanks!  ALLERGIES: lotrel. Boone Master CNA/MA  February 15, 2010 3:21 PM   Additional Follow-up for Phone Call Additional follow up Details #1::        Per [facility]- ok to give Combivent RX as requested 2 puffs qid as needed and Doxycycline 100mg  #8 take 2 today then 1 daily until gone no refills.Reynaldo Minium CMA  February 15, 2010 5:07 PM     Additional Follow-up for Phone Call Additional follow up Details #2::    called the [phone] # and NA and no answering machhine.  Called [phone] and [facility] for pt TCB Vernie Murders  February 15, 2010 5:10 PM  Spoke with pt.  Pt informed of abover recs per CY and aware rxs where sent to [facility].  He verbalized understanding.  Gweneth Dimitri RN  February 17, 2010 4:18 PM   New/Updated Medications: COMBIVENT 18-103 MCG/ACT AERO (IPRATROPIUM-ALBUTEROL) 1 puff 4 times per day as needed DOXYCYCLINE HYCLATE 100 MG CAPS (DOXYCYCLINE HYCLATE) 2 today, then 1 daily until gone Prescriptions: DOXYCYCLINE HYCLATE 100 MG CAPS (DOXYCYCLINE HYCLATE) 2 today, then 1 daily until gone  #8 x 0   Entered by:   Vernie Murders   Authorized by:   Waymon Budge MD   Signed by:   Vernie Murders on 02/15/2010   Method used:   Electronically to        [facility]  (retail)       [address]       [place], [place]  [address]       Ph: [phone]       Fax: [phone]   RxID:   [id number] COMBIVENT 18-103 MCG/ACT AERO (IPRATROPIUM-ALBUTEROL) 1 puff 4 times per day as needed  #1 x 5   Entered by:   Vernie Murders   Authorized by:   Waymon Budge MD   Signed by:   Vernie Murders on 02/15/2010   Method used:   Electronically to        [facility] (retail)       [address]       [place], [place]  [address]       Ph: [phone]       Fax: [phone]   RxID:   [id number]

## 2010-09-13 NOTE — Miscellaneous (Signed)
Summary: Orders Update  Clinical Lists Changes  Orders: Added new Service order of [place] NS50 (NS50) - Signed 

## 2010-09-13 NOTE — Assessment & Plan Note (Signed)
Summary: 4 months/apc   Primary Provider/Referring Provider:  Alwyn Pea  CC:  Follow up visit-post hosptial yesterday.  History of Present Illness: History of Present Illness: 01/05/09- COPD, Diastolic dysfunction, Chronic respiratory failure Mentions mouth swelling since last night. throat feels clogged- nonproductive. Says this has been happening for a year. Feels tired. Not sleeping well- feels restless, toss and turn.  Anxious at night. Does use BIPAP with O2. Wife doesn't tell him he is snoring through BIPAP.  9.24/10- COPD, diastolic dysfunction, Chronic respiratory failure. Hosp in August with angina, had stents. Feels about the same. He gets anxious then dyspneic.  He has no tolerance for being off oxygen. Comments that he feels solids and sometimes liquids will hang mid esophagus. He didn't mention when he saw Dr Daphine Deutscher yesterday. Sleep apnea- Compliant with BiPAP. Feels fluid retention and orthopnea since Dr Allyson Sabal adjusted his diuretics. Coughs scant phlegm. Flu vax yesterday.Sneezing recently.  September 06, 2009- COPD, diastolic dysfunction, Chronic respiratory failure.  No major events since last here. Has been losing a little weight. Notices pains low back and flanks with standing, lying down and position changes. Has to sit up to sleep. Aching pain in low back, then panting dyspnea limit sleeping supine. Also c/o mouth dry. Legs not swelling much. Had flu shot.  Jan 04, 2010- COPD, diastolic dysfunction, Chronic respiratory failure Post hospital f/u- Dyspnea attributed to CHF, COPD, anemia and OSA. Feels some better, but doesn't feel strong or energetic.CXR 12/28/09- CHF.Allthough BNP was <50. V/Q scan- low probability. He needed transfusion 2 units RBCs and will follow up on that with Dr Daphine Deutscher tomorrow. Today breathing is "about the same" with easy dyspnea and pacing himself for stairs or brisk walking. Sometimes mid anterior chest discomfort when he starts walking,  relieved by pacing himself. Home O2 set on 3 or 3.5 L. He sets portable lower, 2-3. Rarely hears a little wheeze.   Current Medications (verified): 1)  Plavix 75 Mg  Tabs (Clopidogrel Bisulfate) .... Once Daily 2)  Furosemide 80 Mg  Tabs (Furosemide) .... Two Times A Day 3)  Crestor 20 Mg  Tabs (Rosuvastatin Calcium) .... Once Daily 4)  Klor-Con 20 Meq  Pack (Potassium Chloride) .... Once Daily 5)  Metolazone 2.5 Mg  Tabs (Metolazone) .... Once Daily 6)  Adult Aspirin Ec Low Strength 81 Mg  Tbec (Aspirin) .... Once Daily 7)  Centrum   Chew (Multiple Vitamins-Minerals) .... Once Daily 8)  Oxygen 3 L/m Advanced 9)  Bipap I =15/ E =12 10)  Combivent 18-103 Mcg/act Aero (Ipratropium-Albuterol) .Marland Kitchen.. 1 Puff Three Times A Day 11)  Janumet 50-500 Mg Tabs (Sitagliptin-Metformin Hcl) .Marland Kitchen.. 1 Once Daily 12)  Nu-Iron 150 Mg Caps (Polysaccharide Iron Complex) .... Take 1 Tablet By Mouth Once A Day 13)  Protonix 40 Mg Tbec (Pantoprazole Sodium) .... Take 1 Tablet By Mouth Once A Day 14)  Loratadine 10 Mg Tabs (Loratadine) .... Take 1 By Mouth Once Daily 15)  Losartan Potassium 50 Mg Tabs (Losartan Potassium) .... Take 1 By Mouth Once Daily 16)  Miralax  Powd (Polyethylene Glycol 3350) .... Once Daily 17)  Xanax 0.5 Mg Tabs (Alprazolam) .... Take 1 By Mouth Three Times A Day 18)  Imdur 60 Mg Xr24h-Tab (Isosorbide Mononitrate) .... Take 1/2 By Mouth Once Daily 19)  Lisinopril 5 Mg Tabs (Lisinopril) .... Take 1 By Mouth Once Daily 20)  Meloxicam 15 Mg Tabs (Meloxicam) .... Take 1 By Mouth Once Daily 21)  Androgel 50 Mg/5gm Gel (Testosterone) .... As  Directed  Allergies (verified): 1)  ! 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 MI Sister- DM  Social History: Last updated: 10/28/2007 Patient states former smoker.   Risk Factors: Smoking Status: quit (10/28/2007)  Past Pulmonary History:  Pulmonary History:  03/1009 DISCHARGE DIAGNOSES: 1. Unstable angina associated with shortness of breath and fatigue. 2. Coronary artery disease with history of bypass grafts, now with     patent grafts except saphenous vein graft to the right coronary     artery, undergoing percutaneous transluminal coronary angioplasty     and stent with a drug-eluting Xience stent and staged during this     admission, percutaneous coronary intervention to the circumflex     artery and stent with a Xience drug-eluting stent. 3. Ongoing bleeding, post initial procedure with stopping of     Integrilin 3-4 hours earlier than originally planned. 4. Blood loss anemia.  We will monitor as an outpatient. 5. Renal sufficiency, currently stable. 6. Chronic obstructive pulmonary disease, on home oxygen. 7. Pulmonary fibrosis. 8. Diastolic dysfunction. 9. Obstructive sleep apnea, on BiPAP. 10.Normal ejection fraction. 11.Diabetes mellitus type 2, stable. 12.Hypotension due to dehydration with holding diuretics and treating     with saline bolus.  Resolved at discharge. 13.Episodic thrombocytopenia.   DISCHARGE CONDITION:  Improved.    Review of Systems      See HPI       The patient complains of shortness of breath with activity, shortness of breath at rest, difficulty swallowing, and sore throat.  The patient denies productive cough, non-productive cough, coughing up blood, chest pain, irregular heartbeats, acid heartburn, indigestion, loss of appetite, abdominal pain, tooth/dental problems, headaches, nasal congestion/difficulty breathing through  nose, sneezing, joint stiffness or pain, rash, change in color of mucus, and fever.    Vital Signs:  Patient profile:   60 year old male Height:      73 inches Weight:      314.38 pounds BMI:     41.63 Pulse rate:   94 / minute BP sitting:   138 / 80  (left arm) Cuff size:   regular  Vitals Entered By: Reynaldo Minium CMA (Jan 04, 2010 10:57 AM)  Physical Exam  Additional Exam:  General: A/Ox3; pleasant and cooperative, NAD,obese, supplemental oxygen with demand regulator-2 L. Arrived 93% on 2L demand Helios  SKIN: no rash. Birthmark left ear NODES: no lymphadenopathy HEENT: [place]/AT, EOM- WNL, Conjuctivae- clear, PERRLA, TM-WNL, Nose- clear, Throat- clear and wnl- . Voice normal NECK: Supple w/ fair ROM, JVD- none, normal carotid impulses w/o bruits Thyroid-  CHEST-Diminished, wheeze, poor airflow, unlabored HEART: RRR, no m/g/r heard ABDOMEN: obese ZOX:WRUE, nl pulses,minimal edema NEURO: Grossly intact to observation    .    Marland Kitchen      Impression & Recommendations:  Problem # 1:  RESPIRATORY FAILURE, CHRONIC (ICD-518.83)  Multifactorial, with important obesity hypoventiliation and diastolic dysfunction components. Pulmonary edema at hospital without elevated BNP suggests his anemia put him in high output failure for a while. His fatigue is consistent with limited cardiopulmonary reserve. We will try to get a PFT to clarify his pulmonary status.  Problem # 2:  SLEEP APNEA (ICD-780.57) He is compliant with BiPap, all night, every night  Medications Added to Medication List This Visit: 1)  Combivent 18-103 Mcg/act Aero (Ipratropium-albuterol) .Marland Kitchen.. 1 puff three times a day 2)  Miralax Powd (Polyethylene glycol 3350) .... Once daily 3)  Xanax 0.5 Mg Tabs (Alprazolam) .... Take 1 by mouth three times a day 4)  Imdur 60 Mg Xr24h-tab (Isosorbide mononitrate) .... Take 1/2 by mouth once daily 5)  Lisinopril 5 Mg Tabs (Lisinopril) .... Take 1 by mouth once daily 6)  Meloxicam 15 Mg  Tabs (Meloxicam) .... Take 1 by mouth once daily 7)  Androgel 50 Mg/5gm Gel (Testosterone) .... As directed  Other Orders: Est. Patient Level IV (30865)  Patient Instructions: 1)  Please schedule a follow-up appointment in 2 months. 2)  Schedule PFT . Do the DLCO and Lung volume components on room air.

## 2010-09-13 NOTE — Progress Notes (Signed)
Summary: results LMTCB x1  Phone Note Call from Patient   Caller: Patient Call For: Harland Aguiniga Summary of Call: xray results Initial call taken by: Rickard Patience,  September 13, 2009 1:21 PM  Follow-up for Phone Call        Left message with family member for pt to call office back.  Gweneth Dimitri RN  September 13, 2009 1:37 PM   Additional Follow-up for Phone Call Additional follow up Details #1::        LMTCB-will sign off on message-see append.Reynaldo Minium CMA  September 14, 2009 9:08 AM

## 2010-09-13 NOTE — Assessment & Plan Note (Signed)
Summary: rov 4 months///kp   Primary Provider/Referring Provider:  Alwyn Pea  CC:  4 month follpow up visit-Pain on both sides-R side worse; dry mouth with CPAP.Marland Kitchen  History of Present Illness: 01/05/09- COPD, Diastolic dysfunction, Chronic respiratory failure Mentions mouth swelling since last night. throat feels clogged- nonproductive. Says this has been happening for a year. Feels tired. Not sleeping well- feels restless, toss and turn.  Anxious at night. Does use BIPAP with O2. Wife doesn't tell him he is snoring through BIPAP.  9.24/10- COPD, diastolic dysfunction, Chronic respiratory failure. Hosp in August with angina, had stents. Feels about the same. He gets anxious then dyspneic.  He has no tolerance for being off oxygen. Comments that he feels solids and sometimes liquids will hang mid esophagus. He didn't mention when he saw Dr Daphine Deutscher yesterday. Sleep apnea- Compliant with BiPAP. Feels fluid retention and orthopnea since Dr Allyson Sabal adjusted his diuretics. Coughs scant phlegm. Flu vax yesterday.Sneezing recently.  September 06, 2009- COPD, diastolic dysfunction, Chreonic respiratory failure.  No major events since last here. Has been losing a little weight. Notices pains low back and flanks with standing, lying down and position changes. Has to sit up to sleep. Aching pain in low back, then panting dyspnea limit sleeping supine. Also c/o mouth dry. Legs not swelling much. Had flu shot.     Current Medications (verified): 1)  Plavix 75 Mg  Tabs (Clopidogrel Bisulfate) .... Once Daily 2)  Furosemide 80 Mg  Tabs (Furosemide) .... Two Times A Day 3)  Crestor 20 Mg  Tabs (Rosuvastatin Calcium) .... Once Daily 4)  Klor-Con 20 Meq  Pack (Potassium Chloride) .... Once Daily 5)  Metolazone 2.5 Mg  Tabs (Metolazone) .... Once Daily 6)  Adult Aspirin Ec Low Strength 81 Mg  Tbec (Aspirin) .... Once Daily 7)  Centrum   Chew (Multiple Vitamins-Minerals) .... Once Daily 8)  Oxygen 3 L/m  Advanced 9)  Bipap I =15/ E =12 10)  Proventil Hfa 108 (90 Base) Mcg/act Aers (Albuterol Sulfate) .... 2 Puffs Every 4 Hours As Needed 11)  Janumet 50-500 Mg Tabs (Sitagliptin-Metformin Hcl) .Marland Kitchen.. 1 Once Daily 12)  Nu-Iron 150 Mg Caps (Polysaccharide Iron Complex) .... Take 1 Tablet By Mouth Once A Day 13)  Protonix 40 Mg Tbec (Pantoprazole Sodium) .... Take 1 Tablet By Mouth Once A Day 14)  Loratadine 10 Mg Tabs (Loratadine) .... Take 1 By Mouth Once Daily 15)  Colchicine 0.6 Mg Tabs (Colchicine) .... Take 1 By Mouth Once Daily 16)  Losartan Potassium 50 Mg Tabs (Losartan Potassium) .... Take 1 By Mouth Once Daily  Allergies (verified): 1)  ! 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 MI Sister- DM  Social History: Last updated: 10/28/2007 Patient states former smoker.   Risk Factors: Smoking Status: quit (10/28/2007)  Past Pulmonary History:  Pulmonary History:  03/1009 DISCHARGE DIAGNOSES: 1. Unstable angina associated with shortness of breath and fatigue. 2. Coronary artery disease with history of bypass grafts, now with     patent grafts except saphenous vein graft to the right coronary     artery, undergoing percutaneous transluminal coronary angioplasty  and stent with a drug-eluting Xience stent and staged during this     admission, percutaneous coronary intervention to the circumflex     artery and stent with a Xience drug-eluting stent. 3. Ongoing bleeding, post initial procedure with stopping of      Integrilin 3-4 hours earlier than originally planned. 4. Blood loss anemia.  We will monitor as an outpatient. 5. Renal sufficiency, currently stable. 6. Chronic obstructive pulmonary disease, on home oxygen. 7. Pulmonary fibrosis. 8. Diastolic dysfunction. 9. Obstructive sleep apnea, on BiPAP. 10.Normal ejection fraction. 11.Diabetes mellitus type 2, stable. 12.Hypotension due to dehydration with holding diuretics and treating     with saline bolus.  Resolved at discharge. 13.Episodic thrombocytopenia.   DISCHARGE CONDITION:  Improved.    Review of Systems      See HPI       The patient complains of weight loss, dyspnea on exertion, and chest pain.  The patient denies anorexia, fever, weight gain, vision loss, decreased hearing, hoarseness, syncope, peripheral edema, prolonged cough, headaches, hemoptysis, abdominal pain, and severe indigestion/heartburn.    Vital Signs:  Patient profile:   60 year old male Height:      73 inches Weight:      324.38 pounds BMI:     42.95 O2 Sat:      88 % on Room air Pulse rate:   106 / minute BP sitting:   138 / 84  (left arm) Cuff size:   large  Vitals Entered By: Reynaldo Minium CMA (September 06, 2009 11:09 AM)  O2 Flow:  Room air  Physical Exam  Additional Exam:  General: A/Ox3; pleasant and cooperative, NAD,obese, supplemental oxygen with demand regulator-2 L. Arrived 88% on RA Helios; 2L increased sat to 92%. SKIN: no rash. Birthmark left ear NODES: no lymphadenopathy HEENT: [place]/AT, EOM- WNL, Conjuctivae- clear, PERRLA, TM-WNL, Nose- clear, Throat- clear and wnl- . Voice normal NECK: Supple w/ fair ROM, JVD- none, normal carotid impulses w/o bruits Thyroid-  CHEST-Diminished, trace rales, poor airflow, unlabored HEART: RRR, no m/g/r heard ABDOMEN: obese ZOX:WRUE, nl pulses,minimal edema NEURO: Grossly intact to observation    .    Marland Kitchen      Impression & Recommendations:  Problem # 1:  RESPIRATORY FAILURE, CHRONIC  (ICD-518.83)  Oxygen saturation is better this visit. I think he is complaining mostly of musculoskeletal back pains, but we will get CXR to check status of his cardiomegaly.  Problem # 2:  SLEEP APNEA (ICD-780.57)  Continue BiPAP at 15/12. Compliance is good and it helps him.  Medications Added to Medication List This Visit: 1)  Loratadine 10 Mg Tabs (Loratadine) .... Take 1 by mouth once daily 2)  Colchicine 0.6 Mg Tabs (Colchicine) .... Take 1 by mouth once daily 3)  Losartan Potassium 50 Mg Tabs (Losartan potassium) .... Take 1 by mouth once daily  Other Orders: Est. Patient Level III (45409) T-2 View CXR (71020TC)  Patient Instructions: 1)  Please schedule a follow-up appointment in 4 months. 2)  Continue BiPAP at 15/12 3)  Otc med for dry mouth- Biotine 4)  Otc med for dry nose- nasal saline spray

## 2010-10-30 LAB — GLUCOSE, CAPILLARY
Glucose-Capillary: 146 mg/dL — ABNORMAL HIGH (ref 70–99)
Glucose-Capillary: 165 mg/dL — ABNORMAL HIGH (ref 70–99)
Glucose-Capillary: 178 mg/dL — ABNORMAL HIGH (ref 70–99)

## 2010-10-30 LAB — CBC
HCT: 30.8 % — ABNORMAL LOW (ref 39.0–52.0)
MCH: 28.1 pg (ref 26.0–34.0)
RDW: 15.1 % (ref 11.5–15.5)
WBC: 7.3 10*3/uL (ref 4.0–10.5)

## 2010-10-30 LAB — BASIC METABOLIC PANEL
BUN: 25 mg/dL — ABNORMAL HIGH (ref 6–23)
CO2: >45 mEq/L (ref 19–32)
Chloride: 91 mEq/L — ABNORMAL LOW (ref 96–112)
Creatinine, Ser: 1.24 mg/dL (ref 0.4–1.5)
Sodium: 144 mEq/L (ref 135–145)

## 2010-10-30 LAB — HEMOGLOBIN AND HEMATOCRIT, BLOOD: Hemoglobin: 9.4 g/dL — ABNORMAL LOW (ref 13.0–17.0)

## 2010-10-31 LAB — CROSSMATCH

## 2010-10-31 LAB — URINALYSIS, ROUTINE W REFLEX MICROSCOPIC
Nitrite: NEGATIVE
Specific Gravity, Urine: 1.011 (ref 1.005–1.030)
pH: 7 (ref 5.0–8.0)

## 2010-10-31 LAB — BASIC METABOLIC PANEL
BUN: 28 mg/dL — ABNORMAL HIGH (ref 6–23)
BUN: 29 mg/dL — ABNORMAL HIGH (ref 6–23)
Chloride: 85 mEq/L — ABNORMAL LOW (ref 96–112)
Chloride: 87 mEq/L — ABNORMAL LOW (ref 96–112)
Creatinine, Ser: 1.28 mg/dL (ref 0.4–1.5)
GFR calc Af Amer: >60 mL/min (ref >60)
GFR calc Af Amer: >60 mL/min (ref >60)
GFR calc non Af Amer: 58 mL/min — ABNORMAL LOW (ref >60)
GFR calc non Af Amer: >60 mL/min (ref >60)
Glucose, Bld: 105 mg/dL — ABNORMAL HIGH (ref 70–99)
Glucose, Bld: 144 mg/dL — ABNORMAL HIGH (ref 70–99)
Potassium: 3.7 mEq/L (ref 3.5–5.1)
Potassium: 4 mEq/L (ref 3.5–5.1)
Potassium: 5 mEq/L (ref 3.5–5.1)
Sodium: 140 mEq/L (ref 135–145)
Sodium: 142 mEq/L (ref 135–145)

## 2010-10-31 LAB — DIFFERENTIAL
Basophils Absolute: 0 10*3/uL (ref 0.0–0.1)
Basophils Relative: 1 % (ref 0–1)
Eosinophils Absolute: 0.1 10*3/uL (ref 0.0–0.7)
Eosinophils Relative: 2 % (ref 0–5)
Lymphocytes Relative: 23 % (ref 12–46)
Lymphs Abs: 1.3 10*3/uL (ref 0.7–4.0)
Monocytes Absolute: 0.7 10*3/uL (ref 0.1–1.0)
Monocytes Relative: 13 % — ABNORMAL HIGH (ref 3–12)
Neutro Abs: 3.6 10*3/uL (ref 1.7–7.7)
Neutrophils Relative %: 62 % (ref 43–77)

## 2010-10-31 LAB — POCT I-STAT 3, VENOUS BLOOD GAS (G3P V)
Acid-Base Excess: 24 mmol/L — ABNORMAL HIGH (ref 0.0–2.0)
pCO2, Ven: 100.7 mmHg (ref 45.0–50.0)
pO2, Ven: 18 mmHg — CL (ref 30.0–45.0)

## 2010-10-31 LAB — COMPREHENSIVE METABOLIC PANEL
ALT: 10 U/L (ref 0–53)
AST: 15 U/L (ref 0–37)
Albumin: 3.6 g/dL (ref 3.5–5.2)
Alkaline Phosphatase: 61 U/L (ref 39–117)
BUN: 35 mg/dL — ABNORMAL HIGH (ref 6–23)
CO2: 45 mEq/L (ref 19–32)
Calcium: 8.6 mg/dL (ref 8.4–10.5)
Chloride: 87 mEq/L — ABNORMAL LOW (ref 96–112)
Creatinine, Ser: 1.31 mg/dL (ref 0.4–1.5)
GFR calc Af Amer: >60 mL/min (ref >60)
GFR calc non Af Amer: 56 mL/min — ABNORMAL LOW (ref >60)
Glucose, Bld: 94 mg/dL (ref 70–99)
Potassium: 3.8 mEq/L (ref 3.5–5.1)
Sodium: 140 mEq/L (ref 135–145)
Total Bilirubin: 0.5 mg/dL (ref 0.3–1.2)
Total Protein: 7.1 g/dL (ref 6.0–8.3)

## 2010-10-31 LAB — CBC
HCT: 26.4 % — ABNORMAL LOW (ref 39.0–52.0)
HCT: 30.1 % — ABNORMAL LOW (ref 39.0–52.0)
HCT: 30.6 % — ABNORMAL LOW (ref 39.0–52.0)
Hemoglobin: 10 g/dL — ABNORMAL LOW (ref 13.0–17.0)
Hemoglobin: 8.7 g/dL — ABNORMAL LOW (ref 13.0–17.0)
Hemoglobin: 9.7 g/dL — ABNORMAL LOW (ref 13.0–17.0)
MCHC: 32.3 g/dL (ref 30.0–36.0)
MCV: 88.4 fL (ref 78.0–100.0)
MCV: 88.8 fL (ref 78.0–100.0)
Platelets: 130 10*3/uL — ABNORMAL LOW (ref 150–400)
Platelets: 146 10*3/uL — ABNORMAL LOW (ref 150–400)
RBC: 3.01 MIL/uL — ABNORMAL LOW (ref 4.22–5.81)
RBC: 3.4 MIL/uL — ABNORMAL LOW (ref 4.22–5.81)
RDW: 14.9 % (ref 11.5–15.5)

## 2010-10-31 LAB — CK TOTAL AND CKMB (NOT AT ARMC)
CK, MB: 0.7 ng/mL (ref 0.3–4.0)
CK, MB: 0.9 ng/mL (ref 0.3–4.0)
Relative Index: INVALID (ref 0.0–2.5)
Relative Index: INVALID (ref 0.0–2.5)
Total CK: 48 U/L (ref 7–232)

## 2010-10-31 LAB — ABO/RH: ABO/RH(D): O POS

## 2010-10-31 LAB — RETICULOCYTES
RBC.: 3.7 MIL/uL — ABNORMAL LOW (ref 4.22–5.81)
Retic Count, Absolute: 44.4 10*3/uL (ref 19.0–186.0)

## 2010-10-31 LAB — TROPONIN I
Troponin I: 0.01 ng/mL (ref 0.00–0.06)
Troponin I: 0.02 ng/mL (ref 0.00–0.06)

## 2010-10-31 LAB — IRON AND TIBC
Iron: 43 ug/dL (ref 42–135)
TIBC: 308 ug/dL (ref 215–435)

## 2010-10-31 LAB — VITAMIN B12: Vitamin B-12: 881 pg/mL (ref 211–911)

## 2010-10-31 LAB — GLUCOSE, CAPILLARY
Glucose-Capillary: 122 mg/dL — ABNORMAL HIGH (ref 70–99)
Glucose-Capillary: 138 mg/dL — ABNORMAL HIGH (ref 70–99)
Glucose-Capillary: 166 mg/dL — ABNORMAL HIGH (ref 70–99)

## 2010-10-31 LAB — FERRITIN: Ferritin: 132 ng/mL (ref 22–322)

## 2010-10-31 LAB — POCT CARDIAC MARKERS: Troponin i, poc: <0.05 ng/mL (ref 0.00–0.09)

## 2010-11-19 LAB — DIFFERENTIAL
Basophils Absolute: 0 10*3/uL (ref 0.0–0.1)
Basophils Relative: 1 % (ref 0–1)
Eosinophils Absolute: 0.1 10*3/uL (ref 0.0–0.7)
Eosinophils Relative: 2 % (ref 0–5)

## 2010-11-19 LAB — URINALYSIS, ROUTINE W REFLEX MICROSCOPIC
Bilirubin Urine: NEGATIVE
Glucose, UA: NEGATIVE mg/dL
Ketones, ur: NEGATIVE mg/dL
Specific Gravity, Urine: 1.013 (ref 1.005–1.030)
pH: 7.5 (ref 5.0–8.0)

## 2010-11-19 LAB — BASIC METABOLIC PANEL
BUN: 21 mg/dL (ref 6–23)
BUN: 31 mg/dL — ABNORMAL HIGH (ref 6–23)
BUN: 28 mg/dL — ABNORMAL HIGH (ref 6–23)
BUN: 30 mg/dL — ABNORMAL HIGH (ref 6–23)
BUN: 38 mg/dL — ABNORMAL HIGH (ref 6–23)
CO2: 43 mEq/L — ABNORMAL HIGH (ref 19–32)
Calcium: 8.4 mg/dL (ref 8.4–10.5)
Calcium: 8.8 mg/dL (ref 8.4–10.5)
Calcium: 8.7 mg/dL (ref 8.4–10.5)
Calcium: 9 mg/dL (ref 8.4–10.5)
Chloride: 100 mEq/L (ref 96–112)
Chloride: 87 mEq/L — ABNORMAL LOW (ref 96–112)
Creatinine, Ser: 1.14 mg/dL (ref 0.4–1.5)
Creatinine, Ser: 1.3 mg/dL (ref 0.4–1.5)
Creatinine, Ser: 1.25 mg/dL (ref 0.4–1.5)
Creatinine, Ser: 1.39 mg/dL (ref 0.4–1.5)
Creatinine, Ser: 1.69 mg/dL — ABNORMAL HIGH (ref 0.4–1.5)
GFR calc Af Amer: >60 mL/min (ref >60)
GFR calc Af Amer: 51 mL/min — ABNORMAL LOW (ref >60)
GFR calc Af Amer: 56 mL/min — ABNORMAL LOW (ref >60)
GFR calc Af Amer: >60 mL/min (ref >60)
GFR calc non Af Amer: 57 mL/min — ABNORMAL LOW (ref >60)
GFR calc non Af Amer: 42 mL/min — ABNORMAL LOW (ref >60)
GFR calc non Af Amer: 53 mL/min — ABNORMAL LOW (ref >60)
GFR calc non Af Amer: 60 mL/min — ABNORMAL LOW (ref >60)
Glucose, Bld: 105 mg/dL — ABNORMAL HIGH (ref 70–99)
Glucose, Bld: 111 mg/dL — ABNORMAL HIGH (ref 70–99)
Glucose, Bld: 131 mg/dL — ABNORMAL HIGH (ref 70–99)
Potassium: 4.1 mEq/L (ref 3.5–5.1)
Potassium: 3.8 mEq/L (ref 3.5–5.1)
Potassium: 3.9 mEq/L (ref 3.5–5.1)
Potassium: 3.9 mEq/L (ref 3.5–5.1)
Potassium: 4.1 mEq/L (ref 3.5–5.1)
Sodium: 137 mEq/L (ref 135–145)

## 2010-11-19 LAB — FOLATE: Folate: >20 ng/mL

## 2010-11-19 LAB — CARDIAC PANEL(CRET KIN+CKTOT+MB+TROPI)
CK, MB: 0.9 ng/mL (ref 0.3–4.0)
CK, MB: 0.7 ng/mL (ref 0.3–4.0)
Relative Index: INVALID (ref 0.0–2.5)
Relative Index: 0.6 (ref 0.0–2.5)
Total CK: 49 U/L (ref 7–232)
Total CK: 47 U/L (ref 7–232)
Total CK: 97 U/L (ref 7–232)
Troponin I: 0.01 ng/mL (ref 0.00–0.06)

## 2010-11-19 LAB — CBC
HCT: 29 % — ABNORMAL LOW (ref 39.0–52.0)
HCT: 30.2 % — ABNORMAL LOW (ref 39.0–52.0)
HCT: 31.3 % — ABNORMAL LOW (ref 39.0–52.0)
HCT: 31.9 % — ABNORMAL LOW (ref 39.0–52.0)
Hemoglobin: 10.2 g/dL — ABNORMAL LOW (ref 13.0–17.0)
MCHC: 32.3 g/dL (ref 30.0–36.0)
MCHC: 31.7 g/dL (ref 30.0–36.0)
MCHC: 32.1 g/dL (ref 30.0–36.0)
MCV: 84.3 fL (ref 78.0–100.0)
MCV: 86.3 fL (ref 78.0–100.0)
Platelets: 120 10*3/uL — ABNORMAL LOW (ref 150–400)
Platelets: 123 10*3/uL — ABNORMAL LOW (ref 150–400)
Platelets: 129 10*3/uL — ABNORMAL LOW (ref 150–400)
Platelets: 135 10*3/uL — ABNORMAL LOW (ref 150–400)
Platelets: 140 10*3/uL — ABNORMAL LOW (ref 150–400)
Platelets: 154 10*3/uL (ref 150–400)
RBC: 3.5 MIL/uL — ABNORMAL LOW (ref 4.22–5.81)
RBC: 3.71 MIL/uL — ABNORMAL LOW (ref 4.22–5.81)
RBC: 3.72 MIL/uL — ABNORMAL LOW (ref 4.22–5.81)
RDW: 16.2 % — ABNORMAL HIGH (ref 11.5–15.5)
RDW: 15.7 % — ABNORMAL HIGH (ref 11.5–15.5)
RDW: 16 % — ABNORMAL HIGH (ref 11.5–15.5)
RDW: 16.3 % — ABNORMAL HIGH (ref 11.5–15.5)
WBC: 4.8 10*3/uL (ref 4.0–10.5)
WBC: 4.9 10*3/uL (ref 4.0–10.5)
WBC: 5.4 10*3/uL (ref 4.0–10.5)
WBC: 5.5 10*3/uL (ref 4.0–10.5)
WBC: 6.6 10*3/uL (ref 4.0–10.5)

## 2010-11-19 LAB — GLUCOSE, CAPILLARY
Glucose-Capillary: 121 mg/dL — ABNORMAL HIGH (ref 70–99)
Glucose-Capillary: 196 mg/dL — ABNORMAL HIGH (ref 70–99)
Glucose-Capillary: 96 mg/dL (ref 70–99)
Glucose-Capillary: 101 mg/dL — ABNORMAL HIGH (ref 70–99)
Glucose-Capillary: 107 mg/dL — ABNORMAL HIGH (ref 70–99)
Glucose-Capillary: 109 mg/dL — ABNORMAL HIGH (ref 70–99)
Glucose-Capillary: 110 mg/dL — ABNORMAL HIGH (ref 70–99)
Glucose-Capillary: 111 mg/dL — ABNORMAL HIGH (ref 70–99)
Glucose-Capillary: 112 mg/dL — ABNORMAL HIGH (ref 70–99)
Glucose-Capillary: 149 mg/dL — ABNORMAL HIGH (ref 70–99)
Glucose-Capillary: 152 mg/dL — ABNORMAL HIGH (ref 70–99)
Glucose-Capillary: 156 mg/dL — ABNORMAL HIGH (ref 70–99)
Glucose-Capillary: 160 mg/dL — ABNORMAL HIGH (ref 70–99)
Glucose-Capillary: 171 mg/dL — ABNORMAL HIGH (ref 70–99)
Glucose-Capillary: 197 mg/dL — ABNORMAL HIGH (ref 70–99)
Glucose-Capillary: 243 mg/dL — ABNORMAL HIGH (ref 70–99)
Glucose-Capillary: 295 mg/dL — ABNORMAL HIGH (ref 70–99)
Glucose-Capillary: 81 mg/dL (ref 70–99)
Glucose-Capillary: 85 mg/dL (ref 70–99)
Glucose-Capillary: 98 mg/dL (ref 70–99)

## 2010-11-19 LAB — COMPREHENSIVE METABOLIC PANEL
ALT: 11 U/L (ref 0–53)
Alkaline Phosphatase: 62 U/L (ref 39–117)
Glucose, Bld: 252 mg/dL — ABNORMAL HIGH (ref 70–99)
Potassium: 3.9 mEq/L (ref 3.5–5.1)
Sodium: 139 mEq/L (ref 135–145)
Total Protein: 7.4 g/dL (ref 6.0–8.3)

## 2010-11-19 LAB — LIPID PANEL
Cholesterol: 123 mg/dL (ref 0–200)
HDL: 36 mg/dL — ABNORMAL LOW (ref >39)

## 2010-11-19 LAB — BLOOD GAS, ARTERIAL
Acid-Base Excess: 18.1 mmol/L — ABNORMAL HIGH (ref 0.0–2.0)
O2 Content: 2 L/min
O2 Saturation: 96.7 %

## 2010-11-19 LAB — PROTIME-INR
INR: 1 (ref 0.00–1.49)
INR: 1 (ref 0.00–1.49)
Prothrombin Time: 13.5 seconds (ref 11.6–15.2)
Prothrombin Time: 12.6 seconds (ref 11.6–15.2)

## 2010-11-19 LAB — HEMOGLOBIN A1C
Hgb A1c MFr Bld: 6.1 % (ref 4.6–6.1)
Mean Plasma Glucose: 128 mg/dL

## 2010-11-19 LAB — POCT CARDIAC MARKERS

## 2010-11-19 LAB — RETICULOCYTES
RBC.: 3.4 MIL/uL — ABNORMAL LOW (ref 4.22–5.81)
Retic Count, Absolute: 34 10*3/uL (ref 19.0–186.0)

## 2010-11-19 LAB — HEMOGLOBIN AND HEMATOCRIT, BLOOD: Hemoglobin: 10.3 g/dL — ABNORMAL LOW (ref 13.0–17.0)

## 2010-11-19 LAB — FERRITIN: Ferritin: 140 ng/mL (ref 22–322)

## 2010-11-19 LAB — BRAIN NATRIURETIC PEPTIDE: Pro B Natriuretic peptide (BNP): <30 pg/mL (ref 0.0–100.0)

## 2010-11-19 LAB — IRON AND TIBC: Iron: 21 ug/dL — ABNORMAL LOW (ref 42–135)

## 2010-11-19 LAB — HEPARIN LEVEL (UNFRACTIONATED): Heparin Unfractionated: 0.62 IU/mL (ref 0.30–0.70)

## 2010-11-19 LAB — TSH: TSH: 0.331 u[IU]/mL — ABNORMAL LOW (ref 0.350–4.500)

## 2010-11-19 LAB — PLATELET COUNT: Platelets: 129 10*3/uL — ABNORMAL LOW (ref 150–400)

## 2010-11-27 ENCOUNTER — Inpatient Hospital Stay (HOSPITAL_COMMUNITY)
Admission: EM | Admit: 2010-11-27 | Discharge: 2010-12-01 | DRG: 683 | Disposition: A | Discharge status: Home / Self Care | Payer: Medicare Other | Attending: Family Medicine | Admitting: Family Medicine

## 2010-11-27 DIAGNOSIS — Z79899 Other long term (current) drug therapy: Secondary | ICD-10-CM

## 2010-11-27 DIAGNOSIS — N184 Chronic kidney disease, stage 4 (severe): Secondary | ICD-10-CM | POA: Diagnosis not present

## 2010-11-27 DIAGNOSIS — Z7902 Long term (current) use of antithrombotics/antiplatelets: Secondary | ICD-10-CM

## 2010-11-27 DIAGNOSIS — I739 Peripheral vascular disease, unspecified: Secondary | ICD-10-CM | POA: Diagnosis present

## 2010-11-27 DIAGNOSIS — J449 Chronic obstructive pulmonary disease, unspecified: Secondary | ICD-10-CM | POA: Diagnosis present

## 2010-11-27 DIAGNOSIS — D638 Anemia in other chronic diseases classified elsewhere: Secondary | ICD-10-CM | POA: Diagnosis present

## 2010-11-27 DIAGNOSIS — Z9981 Dependence on supplemental oxygen: Secondary | ICD-10-CM

## 2010-11-27 DIAGNOSIS — G4733 Obstructive sleep apnea (adult) (pediatric): Secondary | ICD-10-CM | POA: Diagnosis present

## 2010-11-27 DIAGNOSIS — K219 Gastro-esophageal reflux disease without esophagitis: Secondary | ICD-10-CM | POA: Diagnosis present

## 2010-11-27 DIAGNOSIS — J4489 Other specified chronic obstructive pulmonary disease: Secondary | ICD-10-CM | POA: Diagnosis present

## 2010-11-27 DIAGNOSIS — Z951 Presence of aortocoronary bypass graft: Secondary | ICD-10-CM

## 2010-11-27 DIAGNOSIS — E119 Type 2 diabetes mellitus without complications: Secondary | ICD-10-CM | POA: Diagnosis present

## 2010-11-27 DIAGNOSIS — T502X5A Adverse effect of carbonic-anhydrase inhibitors, benzothiadiazides and other diuretics, initial encounter: Secondary | ICD-10-CM | POA: Diagnosis present

## 2010-11-27 DIAGNOSIS — R040 Epistaxis: Secondary | ICD-10-CM | POA: Diagnosis present

## 2010-11-27 DIAGNOSIS — E860 Dehydration: Secondary | ICD-10-CM | POA: Diagnosis present

## 2010-11-27 DIAGNOSIS — Z23 Encounter for immunization: Secondary | ICD-10-CM

## 2010-11-27 DIAGNOSIS — N179 Acute kidney failure, unspecified: Principal | ICD-10-CM | POA: Diagnosis present

## 2010-11-27 DIAGNOSIS — D62 Acute posthemorrhagic anemia: Secondary | ICD-10-CM | POA: Diagnosis not present

## 2010-11-27 DIAGNOSIS — D696 Thrombocytopenia, unspecified: Secondary | ICD-10-CM | POA: Diagnosis not present

## 2010-11-27 DIAGNOSIS — Z9861 Coronary angioplasty status: Secondary | ICD-10-CM

## 2010-11-27 DIAGNOSIS — I2510 Atherosclerotic heart disease of native coronary artery without angina pectoris: Secondary | ICD-10-CM | POA: Diagnosis present

## 2010-11-27 DIAGNOSIS — Z7982 Long term (current) use of aspirin: Secondary | ICD-10-CM

## 2010-11-27 LAB — COMPREHENSIVE METABOLIC PANEL
AST: 15 U/L (ref 0–37)
BUN: 57 mg/dL — ABNORMAL HIGH (ref 6–23)
CO2: 38 mEq/L — ABNORMAL HIGH (ref 19–32)
Chloride: 93 mEq/L — ABNORMAL LOW (ref 96–112)
Creatinine, Ser: 3.22 mg/dL — ABNORMAL HIGH (ref 0.4–1.5)
GFR calc Af Amer: 24 mL/min — ABNORMAL LOW (ref >60)
GFR calc non Af Amer: 20 mL/min — ABNORMAL LOW (ref >60)
Glucose, Bld: 144 mg/dL — ABNORMAL HIGH (ref 70–99)
Total Bilirubin: 0.4 mg/dL (ref 0.3–1.2)

## 2010-11-27 LAB — DIFFERENTIAL
Basophils Absolute: 0 K/uL (ref 0.0–0.1)
Basophils Relative: 1 % (ref 0–1)
Eosinophils Absolute: 0.1 10*3/uL (ref 0.0–0.7)
Eosinophils Relative: 2 % (ref 0–5)
Lymphocytes Relative: 22 % (ref 12–46)
Lymphs Abs: 1.1 10*3/uL (ref 0.7–4.0)
Monocytes Absolute: 0.6 10*3/uL (ref 0.1–1.0)
Monocytes Relative: 11 % (ref 3–12)
Neutro Abs: 3.1 K/uL (ref 1.7–7.7)
Neutrophils Relative %: 64 % (ref 43–77)

## 2010-11-27 LAB — PROTIME-INR
INR: 0.93 (ref 0.00–1.49)
Prothrombin Time: 12.7 s (ref 11.6–15.2)

## 2010-11-27 LAB — GLUCOSE, CAPILLARY: Glucose-Capillary: 122 mg/dL — ABNORMAL HIGH (ref 70–99)

## 2010-11-27 LAB — CBC
HCT: 28.4 % — ABNORMAL LOW (ref 39.0–52.0)
Hemoglobin: 8.2 g/dL — ABNORMAL LOW (ref 13.0–17.0)
MCH: 26.6 pg (ref 26.0–34.0)
MCHC: 28.9 g/dL — ABNORMAL LOW (ref 30.0–36.0)
MCV: 92.2 fL (ref 78.0–100.0)
Platelets: 162 10*3/uL (ref 150–400)
RBC: 3.08 MIL/uL — ABNORMAL LOW (ref 4.22–5.81)
RDW: 13.5 % (ref 11.5–15.5)
WBC: 4.9 K/uL (ref 4.0–10.5)

## 2010-11-27 LAB — IRON AND TIBC
Saturation Ratios: 9 % — ABNORMAL LOW (ref 20–55)
TIBC: 268 ug/dL (ref 215–435)

## 2010-11-27 LAB — COMPREHENSIVE METABOLIC PANEL WITH GFR
ALT: <8 U/L (ref 0–53)
Albumin: 3.7 g/dL (ref 3.5–5.2)
Alkaline Phosphatase: 55 U/L (ref 39–117)
Calcium: 8.8 mg/dL (ref 8.4–10.5)
Potassium: 4.2 meq/L (ref 3.5–5.1)
Sodium: 139 meq/L (ref 135–145)
Total Protein: 7.3 g/dL (ref 6.0–8.3)

## 2010-11-27 LAB — APTT: aPTT: 30 s (ref 24–37)

## 2010-11-28 LAB — GLUCOSE, CAPILLARY
Glucose-Capillary: 118 mg/dL — ABNORMAL HIGH (ref 70–99)
Glucose-Capillary: 98 mg/dL (ref 70–99)
Glucose-Capillary: 87 mg/dL (ref 70–99)
Glucose-Capillary: 104 mg/dL — ABNORMAL HIGH (ref 70–99)

## 2010-11-28 LAB — BASIC METABOLIC PANEL
Chloride: 99 mEq/L (ref 96–112)
Creatinine, Ser: 2.86 mg/dL — ABNORMAL HIGH (ref 0.4–1.5)
GFR calc Af Amer: 28 mL/min — ABNORMAL LOW (ref >60)
GFR calc non Af Amer: 22 mL/min — ABNORMAL LOW (ref >60)
Potassium: 4.2 mEq/L (ref 3.5–5.1)
Potassium: 4.7 mEq/L (ref 3.5–5.1)
Sodium: 141 mEq/L (ref 135–145)

## 2010-11-28 LAB — CBC
Platelets: 149 10*3/uL — ABNORMAL LOW (ref 150–400)
RDW: 13.5 % (ref 11.5–15.5)
WBC: 4.8 10*3/uL (ref 4.0–10.5)

## 2010-11-28 LAB — APTT: aPTT: 36 seconds (ref 24–37)

## 2010-11-29 LAB — CBC
HCT: 30.3 % — ABNORMAL LOW (ref 39.0–52.0)
Hemoglobin: 9 g/dL — ABNORMAL LOW (ref 13.0–17.0)
MCV: 91.1 fL (ref 78.0–100.0)
MCV: 91.5 fL (ref 78.0–100.0)
Platelets: 166 10*3/uL (ref 150–400)
RDW: 13.7 % (ref 11.5–15.5)
RDW: 13.5 % (ref 11.5–15.5)
WBC: 6.4 10*3/uL (ref 4.0–10.5)
WBC: 6.1 10*3/uL (ref 4.0–10.5)

## 2010-11-29 LAB — CROSSMATCH: ABO/RH(D): O POS

## 2010-11-29 LAB — BASIC METABOLIC PANEL
BUN: 49 mg/dL — ABNORMAL HIGH (ref 6–23)
CO2: 36 mEq/L — ABNORMAL HIGH (ref 19–32)
Calcium: 8.8 mg/dL (ref 8.4–10.5)
Creatinine, Ser: 3.03 mg/dL — ABNORMAL HIGH (ref 0.4–1.5)
GFR calc non Af Amer: 21 mL/min — ABNORMAL LOW (ref >60)
Glucose, Bld: 127 mg/dL — ABNORMAL HIGH (ref 70–99)
Sodium: 138 mEq/L (ref 135–145)

## 2010-11-29 LAB — GLUCOSE, CAPILLARY

## 2010-11-30 LAB — BASIC METABOLIC PANEL
BUN: 47 mg/dL — ABNORMAL HIGH (ref 6–23)
CO2: 38 mEq/L — ABNORMAL HIGH (ref 19–32)
Calcium: 9.2 mg/dL (ref 8.4–10.5)
Chloride: 92 mEq/L — ABNORMAL LOW (ref 96–112)
Creatinine, Ser: 2.89 mg/dL — ABNORMAL HIGH (ref 0.4–1.5)
GFR calc Af Amer: 27 mL/min — ABNORMAL LOW (ref >60)
Glucose, Bld: 108 mg/dL — ABNORMAL HIGH (ref 70–99)

## 2010-11-30 LAB — GLUCOSE, CAPILLARY: Glucose-Capillary: 124 mg/dL — ABNORMAL HIGH (ref 70–99)

## 2010-12-01 LAB — BASIC METABOLIC PANEL
BUN: 42 mg/dL — ABNORMAL HIGH (ref 6–23)
CO2: 38 mEq/L — ABNORMAL HIGH (ref 19–32)
Chloride: 95 mEq/L — ABNORMAL LOW (ref 96–112)
Creatinine, Ser: 2.76 mg/dL — ABNORMAL HIGH (ref 0.4–1.5)
Glucose, Bld: 101 mg/dL — ABNORMAL HIGH (ref 70–99)
Potassium: 4.1 mEq/L (ref 3.5–5.1)

## 2010-12-01 LAB — GLUCOSE, CAPILLARY: Glucose-Capillary: 111 mg/dL — ABNORMAL HIGH (ref 70–99)

## 2010-12-02 LAB — GLUCOSE, CAPILLARY
Glucose-Capillary: 58 mg/dL — ABNORMAL LOW (ref 70–99)
Glucose-Capillary: 66 mg/dL — ABNORMAL LOW (ref 70–99)
Glucose-Capillary: 96 mg/dL (ref 70–99)

## 2010-12-04 ENCOUNTER — Emergency Department (HOSPITAL_COMMUNITY): Payer: Medicare Other

## 2010-12-04 ENCOUNTER — Inpatient Hospital Stay (HOSPITAL_COMMUNITY)
Admission: EM | Admit: 2010-12-04 | Discharge: 2010-12-16 | DRG: 682 | Disposition: A | Discharge status: Home Health | Payer: Medicare Other | Attending: Family Medicine | Admitting: Family Medicine

## 2010-12-04 DIAGNOSIS — N139 Obstructive and reflux uropathy, unspecified: Secondary | ICD-10-CM | POA: Diagnosis present

## 2010-12-04 DIAGNOSIS — M109 Gout, unspecified: Secondary | ICD-10-CM | POA: Diagnosis present

## 2010-12-04 DIAGNOSIS — D638 Anemia in other chronic diseases classified elsewhere: Secondary | ICD-10-CM | POA: Diagnosis present

## 2010-12-04 DIAGNOSIS — Z951 Presence of aortocoronary bypass graft: Secondary | ICD-10-CM

## 2010-12-04 DIAGNOSIS — Z01812 Encounter for preprocedural laboratory examination: Secondary | ICD-10-CM

## 2010-12-04 DIAGNOSIS — D631 Anemia in chronic kidney disease: Secondary | ICD-10-CM | POA: Diagnosis present

## 2010-12-04 DIAGNOSIS — G4733 Obstructive sleep apnea (adult) (pediatric): Secondary | ICD-10-CM | POA: Diagnosis present

## 2010-12-04 DIAGNOSIS — E875 Hyperkalemia: Secondary | ICD-10-CM | POA: Diagnosis not present

## 2010-12-04 DIAGNOSIS — R339 Retention of urine, unspecified: Secondary | ICD-10-CM | POA: Diagnosis not present

## 2010-12-04 DIAGNOSIS — I129 Hypertensive chronic kidney disease with stage 1 through stage 4 chronic kidney disease, or unspecified chronic kidney disease: Secondary | ICD-10-CM | POA: Diagnosis present

## 2010-12-04 DIAGNOSIS — E119 Type 2 diabetes mellitus without complications: Secondary | ICD-10-CM | POA: Diagnosis present

## 2010-12-04 DIAGNOSIS — I2510 Atherosclerotic heart disease of native coronary artery without angina pectoris: Secondary | ICD-10-CM | POA: Diagnosis present

## 2010-12-04 DIAGNOSIS — N189 Chronic kidney disease, unspecified: Secondary | ICD-10-CM | POA: Diagnosis present

## 2010-12-04 DIAGNOSIS — E669 Obesity, unspecified: Secondary | ICD-10-CM | POA: Diagnosis present

## 2010-12-04 DIAGNOSIS — J4489 Other specified chronic obstructive pulmonary disease: Secondary | ICD-10-CM | POA: Diagnosis present

## 2010-12-04 DIAGNOSIS — N179 Acute kidney failure, unspecified: Principal | ICD-10-CM | POA: Diagnosis present

## 2010-12-04 DIAGNOSIS — N1330 Unspecified hydronephrosis: Secondary | ICD-10-CM | POA: Diagnosis present

## 2010-12-04 DIAGNOSIS — D696 Thrombocytopenia, unspecified: Secondary | ICD-10-CM | POA: Diagnosis present

## 2010-12-04 DIAGNOSIS — J9620 Acute and chronic respiratory failure, unspecified whether with hypoxia or hypercapnia: Secondary | ICD-10-CM | POA: Diagnosis not present

## 2010-12-04 DIAGNOSIS — G9341 Metabolic encephalopathy: Secondary | ICD-10-CM | POA: Diagnosis not present

## 2010-12-04 DIAGNOSIS — Z7982 Long term (current) use of aspirin: Secondary | ICD-10-CM

## 2010-12-04 DIAGNOSIS — J449 Chronic obstructive pulmonary disease, unspecified: Secondary | ICD-10-CM | POA: Diagnosis present

## 2010-12-04 DIAGNOSIS — D509 Iron deficiency anemia, unspecified: Secondary | ICD-10-CM | POA: Diagnosis present

## 2010-12-04 LAB — URINALYSIS, ROUTINE W REFLEX MICROSCOPIC
Bilirubin Urine: NEGATIVE
Ketones, ur: NEGATIVE mg/dL
Nitrite: NEGATIVE
Protein, ur: NEGATIVE mg/dL
Urobilinogen, UA: 0.2 mg/dL (ref 0.0–1.0)

## 2010-12-04 LAB — BASIC METABOLIC PANEL
BUN: 63 mg/dL — ABNORMAL HIGH (ref 6–23)
Calcium: 8.4 mg/dL (ref 8.4–10.5)
Creatinine, Ser: 3.84 mg/dL — ABNORMAL HIGH (ref 0.4–1.5)
GFR calc Af Amer: 20 mL/min — ABNORMAL LOW (ref >60)
GFR calc non Af Amer: 16 mL/min — ABNORMAL LOW (ref >60)

## 2010-12-04 LAB — GLUCOSE, CAPILLARY: Glucose-Capillary: 153 mg/dL — ABNORMAL HIGH (ref 70–99)

## 2010-12-04 LAB — CBC
MCH: 27.1 pg (ref 26.0–34.0)
MCV: 93.9 fL (ref 78.0–100.0)
Platelets: 133 10*3/uL — ABNORMAL LOW (ref 150–400)
RDW: 13 % (ref 11.5–15.5)

## 2010-12-04 LAB — DIFFERENTIAL
Basophils Relative: 0 % (ref 0–1)
Eosinophils Absolute: 0.1 10*3/uL (ref 0.0–0.7)
Eosinophils Relative: 1 % (ref 0–5)
Lymphs Abs: 1 10*3/uL (ref 0.7–4.0)
Monocytes Relative: 14 % — ABNORMAL HIGH (ref 3–12)

## 2010-12-04 LAB — HEPATIC FUNCTION PANEL
AST: 14 U/L (ref 0–37)
Bilirubin, Direct: <0.1 mg/dL (ref 0.0–0.3)
Total Bilirubin: 0.3 mg/dL (ref 0.3–1.2)

## 2010-12-04 LAB — POCT CARDIAC MARKERS
Myoglobin, poc: 203 ng/mL (ref 12–200)
Troponin i, poc: <0.05 ng/mL (ref 0.00–0.09)

## 2010-12-04 LAB — APTT: aPTT: 21 seconds — ABNORMAL LOW (ref 24–37)

## 2010-12-04 LAB — BRAIN NATRIURETIC PEPTIDE: Pro B Natriuretic peptide (BNP): <30 pg/mL (ref 0.0–100.0)

## 2010-12-05 ENCOUNTER — Inpatient Hospital Stay (HOSPITAL_COMMUNITY): Payer: Medicare Other

## 2010-12-05 LAB — RENAL FUNCTION PANEL
BUN: 61 mg/dL — ABNORMAL HIGH (ref 6–23)
Calcium: 8.6 mg/dL (ref 8.4–10.5)
Glucose, Bld: 130 mg/dL — ABNORMAL HIGH (ref 70–99)
Phosphorus: 4.5 mg/dL (ref 2.3–4.6)
Sodium: 142 mEq/L (ref 135–145)

## 2010-12-05 LAB — CBC
MCH: 26.8 pg (ref 26.0–34.0)
MCV: 94 fL (ref 78.0–100.0)
Platelets: 137 10*3/uL — ABNORMAL LOW (ref 150–400)
RDW: 13.3 % (ref 11.5–15.5)
WBC: 7.5 10*3/uL (ref 4.0–10.5)

## 2010-12-05 LAB — HEMOGLOBIN A1C
Hgb A1c MFr Bld: 6.1 % — ABNORMAL HIGH (ref <5.7)
Mean Plasma Glucose: 128 mg/dL — ABNORMAL HIGH (ref <117)

## 2010-12-05 LAB — GLUCOSE, CAPILLARY: Glucose-Capillary: 109 mg/dL — ABNORMAL HIGH (ref 70–99)

## 2010-12-05 LAB — LIPID PANEL
Total CHOL/HDL Ratio: 3 RATIO
VLDL: 13 mg/dL (ref 0–40)

## 2010-12-05 NOTE — H&P (Signed)
[name] [name], [name] [name]            ACCOUNT NO.:  [id number]  MEDICAL RECORD NO.:  [id number]           PATIENT TYPE:  I  LOCATION:  6705                         FACILITY:  [facility]  PHYSICIAN:  Hollice Espy, M.D.DATE OF BIRTH:  Jul 02, 1951  DATE OF ADMISSION:  11/27/2010 DATE OF DISCHARGE:                             HISTORY & PHYSICAL   PRIMARY CARE PHYSICIAN:  [name], MD  CARDIOLOGIST:  Nanetta Batty, MD  PULMONOLOGIST:  Rennis Chris. Maple Hudson, MD, FCCP, FACP  CHIEF COMPLAINT:  Nosebleed.  HISTORY OF PRESENT ILLNESS:  The patient is a 60 year old African American male with past medical history of extensive CAD and COPD who presented to the emergency room today after he started having a severe nosebleed.  He said that for the past 8 months he has had severe nosebleeds which have lasted oftentimes 3-4 hours per episode.  This one was unrelenting and did not improve and so he came in to the emergency room to be evaluated.  In the emergency room, he was noted to have a blood pressure on admission of 133/65 with a heart rate of 80.  He had labs checked and his hemoglobin was slightly on the low end at 8.2 compared to 9 months prior when his hemoglobin was 9.6.  The patient had external compression and was monitored for several hours in the emergency room and his nose quit bleeding.  This was stable.  However, the plan initially was to discharge the patient but then his additional blood work returned when it was noted that he showed signs of acute renal failure with a BUN of 57 and a creatinine of 3.22 compared to blood work done 9 months ago.  At that time his BUN was 25 and creatinine of 1.24.  [facility] were contacted and the patient is being admitted for acute renal failure.  When I saw the patient, he had been sent up the 6700 unit.  He was doing well.  He denied any headaches, vision changes, dysphagia.  No current nose bleeding.  No chest pain, palpitations,  no acute shortness of breath.  He says that while on oxygen, his breathing is at its baseline where it is comfortable on 3 L and then with any type of ambulation when he takes off his oxygen that is when he gets winded which is his normal baseline. He denies any wheezing, coughing.  No abdominal pain.  No hematuria, no dysuria, no constipation, no diarrhea, no focal extremity numbness, weakness, or pain.  REVIEW OF SYSTEMS:  Otherwise negative.  PAST MEDICAL HISTORY:  Extensive which includes: 1. COPD based on his previous blood work. 2. Chronic kidney disease stage I. 3. CAD status post CABG and then repeat stent placement. 4. Anemia which looks to be more secondary to chronic disease,     peripheral vascular disease, obstructive sleep apnea on BiPAP     erectile dysfunction, and diabetes mellitus type 2.  CURRENT MEDICATIONS:  Based off the pharmacy med rec are as follows: 1. Metoprolol 25 p.o. b.i.d. 2. Protonix 40 p.o. daily. 3. Losartan 50 p.o. daily. 4. K-Dur 20 mEq p.o. b.i.d. 5. Multivitamin  p.o. daily. 6. Plavix 75 p.o. daily. 7. Metolazone 7.5 on Mondays, Wednesdays, Fridays. 8. Metformin XR 750. 9. Imdur 60. 10.Iron 150. 11.Lasix 80 b.i.d. 12.Crestor 20. 13.ProAir 2 puffs daily as needed. 14.Claritin 10. 15.Aspirin 325. 16.Xanax 0.5 t.i.d. p.r.n.  ALLERGIES:  The patient has allergies to ACE INHIBITOR, although it is of note, he is on an ARB; and PERCOCET which causes hallucinations, not a true allergy.  SOCIAL HISTORY:  The patient is an ex-smoker, rarely drinks, denies any illegal drug use.  FAMILY HISTORY:  Noted for CAD and diabetes.  PHYSICAL EXAMINATION:  VITAL SIGNS:  On admission, temperature 97.8, heart rate 82, blood pressure 125/69, respirations 19, O2 sat 97% on 2 L.  Please note that these are vitals obtained after the patient was transferred to the floor.  Vitals review in the emergency room are similar. GENERAL:  He is alert and oriented x3,  in no apparent distress. HEENT: Normocephalic atraumatic.  His mucous membranes are dry.  He has no carotid bruits. HEART:  Regular rate and rhythm.  S1, S2.  He has got 2/6 systolic ejection murmur. LUNGS:  He has some mild left-sided end-expiratory wheezing but otherwise clear. ABDOMEN:  Soft, obese, nontender.  Positive bowel sounds. EXTREMITIES:  No clubbing, cyanosis.  He has trace pitting edema.  LABORATORY WORK:  INR 0.93.  White count 4.9, H and H 8 and 28, MCV of 92, platelet count 162, this compares to a hemoglobin of 9.6 nine months ago.  Sodium 139, potassium 4.2, chloride 93, bicarb 38, BUN 57, creatinine is 3.22, glucose is 144.  LFTs are unremarkable compared to 9 months ago, his BUN was 24 at that time with a creatinine of 1.24.  ASSESSMENT AND PLAN: 1. Acute renal failure in the setting of chronic stage I kidney     disease.  I suspect that his acute renal failure is more     dehydration and prerenal in nature.  This is likely a combination     of heavy diuretics, especially the Lasix plus.  There may be a     component of acute blood loss anemia from his nosebleed given how     severe his episodes have been.  Would recommend at this time, given     that he is not hypotensive, would continue his antihypertensives     except for his ARB and his diuretics.  Would also hydrate him.  I     have reviewed his echocardiogram from last year and he has a normal     ejection fraction and concentric hypertrophy with no signs of     diastolic dysfunction.  To the best of my knowledge, in his     records, he does not have congestive heart failure, so therefore     would hydrate him at 100 mL an hour.  If by the following morning,     we see an improvement in his renal function, could discharge him     home once his renal function has gotten close to his baseline.  If     his renal function has only minimal improvement, then would     consider getting a kidney consult.  Can  restart his diuretics in a     tapered basis and have close followup with his PCP, Dr. Nanetta Batty. 2. Epistaxis.  Clearly the patient has had some pretty severe     nosebleeds and they seem to be recurrent.  He is  on aspirin and     Plavix and given his extensive CAD, would not make any changes in     those medications other than to consider perhaps aspirin 81 mg     instead of 325.  Beyond that, I would consider seeing if we could     get him set up with a humidifier for his home O2 to perhaps have     less incidence of dry nasal passages and decrease the frequency of     nosebleeds. 3. Chronic obstructive pulmonary disease.  We will continue home O2. 4. History of peripheral vascular disease, stable. 5. History of gastroesophageal reflux disease.  Continue PPI.  History     of diabetes mellitus type 2.  We will continue his metformin and     put him on a sliding scale.  All diagnoses were present on admission.     Hollice Espy, M.D.     SKK/MEDQ  D:  11/27/2010  T:  11/27/2010  Job:  [id number]  cc:   Nanetta Batty, M.D. [name], M.D. Clinton D. Maple Hudson, MD, [facility], FACP  Electronically Signed by Virginia Rochester M.D. on 12/05/2010 02:03:23 PM

## 2010-12-06 ENCOUNTER — Other Ambulatory Visit: Payer: Self-pay

## 2010-12-06 ENCOUNTER — Inpatient Hospital Stay (HOSPITAL_COMMUNITY): Payer: Medicare Other

## 2010-12-06 ENCOUNTER — Ambulatory Visit (HOSPITAL_COMMUNITY)
Admission: RE | Admit: 2010-12-06 | Discharge: 2010-12-06 | Disposition: A | Discharge status: Home / Self Care | Payer: Medicare Other | Source: Ambulatory Visit | Attending: Urology | Admitting: Urology

## 2010-12-06 DIAGNOSIS — M79609 Pain in unspecified limb: Secondary | ICD-10-CM

## 2010-12-06 DIAGNOSIS — N1330 Unspecified hydronephrosis: Secondary | ICD-10-CM | POA: Insufficient documentation

## 2010-12-06 DIAGNOSIS — R109 Unspecified abdominal pain: Secondary | ICD-10-CM | POA: Insufficient documentation

## 2010-12-06 LAB — CBC
HCT: 28.3 % — ABNORMAL LOW (ref 39.0–52.0)
MCV: 94 fL (ref 78.0–100.0)
Platelets: 132 10*3/uL — ABNORMAL LOW (ref 150–400)
RBC: 3.01 MIL/uL — ABNORMAL LOW (ref 4.22–5.81)
WBC: 6.6 10*3/uL (ref 4.0–10.5)

## 2010-12-06 LAB — RENAL FUNCTION PANEL
Albumin: 3.1 g/dL — ABNORMAL LOW (ref 3.5–5.2)
BUN: 58 mg/dL — ABNORMAL HIGH (ref 6–23)
CO2: 39 mEq/L — ABNORMAL HIGH (ref 19–32)
Chloride: 97 mEq/L (ref 96–112)
Creatinine, Ser: 3.18 mg/dL — ABNORMAL HIGH (ref 0.4–1.5)
GFR calc Af Amer: 24 mL/min — ABNORMAL LOW (ref >60)
Glucose, Bld: 109 mg/dL — ABNORMAL HIGH (ref 70–99)
Phosphorus: 4.1 mg/dL (ref 2.3–4.6)
Sodium: 143 mEq/L (ref 135–145)

## 2010-12-06 LAB — GLUCOSE, CAPILLARY: Glucose-Capillary: 98 mg/dL (ref 70–99)

## 2010-12-06 LAB — IRON AND TIBC
Saturation Ratios: 7 % — ABNORMAL LOW (ref 20–55)
TIBC: 220 ug/dL (ref 215–435)
UIBC: 204 ug/dL

## 2010-12-07 ENCOUNTER — Inpatient Hospital Stay (HOSPITAL_COMMUNITY): Payer: Medicare Other

## 2010-12-07 DIAGNOSIS — E662 Morbid (severe) obesity with alveolar hypoventilation: Secondary | ICD-10-CM

## 2010-12-07 DIAGNOSIS — E872 Acidosis: Secondary | ICD-10-CM

## 2010-12-07 DIAGNOSIS — J9600 Acute respiratory failure, unspecified whether with hypoxia or hypercapnia: Secondary | ICD-10-CM

## 2010-12-07 DIAGNOSIS — J449 Chronic obstructive pulmonary disease, unspecified: Secondary | ICD-10-CM

## 2010-12-07 LAB — PROCALCITONIN: Procalcitonin: 0.16 ng/mL

## 2010-12-07 LAB — BLOOD GAS, ARTERIAL
Bicarbonate: 38.1 mEq/L — ABNORMAL HIGH (ref 20.0–24.0)
Bicarbonate: 37.4 mEq/L — ABNORMAL HIGH (ref 20.0–24.0)
Drawn by: 225631
Drawn by: 22563
Expiratory PAP: 5
FIO2: 0.28 %
FIO2: 0.28 %
O2 Content: 3 L/min
O2 Saturation: 99.3 %
O2 Saturation: 91.1 %
Patient temperature: 98.6
TCO2: 40.1 mmol/L (ref 0–100)
pCO2 arterial: 144 mmHg (ref 35.0–45.0)
pCO2 arterial: 109 mmHg (ref 35.0–45.0)
pH, Arterial: 7.171 — CL (ref 7.350–7.450)
pO2, Arterial: 212 mmHg — ABNORMAL HIGH (ref 80.0–100.0)
pO2, Arterial: 65.7 mmHg — ABNORMAL LOW (ref 80.0–100.0)
pO2, Arterial: 58.2 mmHg — ABNORMAL LOW (ref 80.0–100.0)

## 2010-12-07 LAB — BASIC METABOLIC PANEL
BUN: 57 mg/dL — ABNORMAL HIGH (ref 6–23)
CO2: 39 mEq/L — ABNORMAL HIGH (ref 19–32)
Calcium: 8.5 mg/dL (ref 8.4–10.5)
Chloride: 101 mEq/L (ref 96–112)
Creatinine, Ser: 2.83 mg/dL — ABNORMAL HIGH (ref 0.4–1.5)
GFR calc Af Amer: 28 mL/min — ABNORMAL LOW (ref >60)
GFR calc non Af Amer: 23 mL/min — ABNORMAL LOW (ref >60)
Glucose, Bld: 154 mg/dL — ABNORMAL HIGH (ref 70–99)
Potassium: 5.3 mEq/L — ABNORMAL HIGH (ref 3.5–5.1)
Sodium: 142 mEq/L (ref 135–145)

## 2010-12-07 LAB — CBC
HCT: 29.3 % — ABNORMAL LOW (ref 39.0–52.0)
Hemoglobin: 8.4 g/dL — ABNORMAL LOW (ref 13.0–17.0)
MCH: 27.1 pg (ref 26.0–34.0)
MCHC: 28.7 g/dL — ABNORMAL LOW (ref 30.0–36.0)
MCV: 94.5 fL (ref 78.0–100.0)
Platelets: 171 10*3/uL (ref 150–400)
RBC: 3.1 MIL/uL — ABNORMAL LOW (ref 4.22–5.81)
RDW: 13 % (ref 11.5–15.5)
WBC: 9.1 10*3/uL (ref 4.0–10.5)

## 2010-12-07 LAB — LACTIC ACID, PLASMA: Lactic Acid, Venous: 1.5 mmol/L (ref 0.5–2.2)

## 2010-12-07 LAB — BRAIN NATRIURETIC PEPTIDE: Pro B Natriuretic peptide (BNP): 121 pg/mL — ABNORMAL HIGH (ref 0.0–100.0)

## 2010-12-07 LAB — GLUCOSE, CAPILLARY: Glucose-Capillary: 183 mg/dL — ABNORMAL HIGH (ref 70–99)

## 2010-12-07 NOTE — Op Note (Signed)
[name] [name], [name] [name]            ACCOUNT NO.:  [id number]  MEDICAL RECORD NO.:  [id number]           PATIENT TYPE:  I  LOCATION:  3005                         FACILITY:  [facility]  PHYSICIAN:  Bertram Millard. [name], M.D.DATE OF BIRTH:  01/23/1951  DATE OF PROCEDURE:  12/06/2010 DATE OF DISCHARGE:                              OPERATIVE REPORT   PREOPERATIVE DIAGNOSIS:  Left hydronephrosis.  POSTOPERATIVE DIAGNOSIS:  Left hydronephrosis.  PROCEDURE:  Cystoscopy, left retrograde ureteropyelogram, left ureteroscopy, double-J stent placement on left (26 x 6-French contour stent without string).  SURGEON:  Bertram Millard. [name], M.D.  ANESTHESIA:  General.  COMPLICATIONS:  None.  BRIEF HISTORY:  A 60 year old male with a history of left hydronephrosis.  This was recently found on evaluation for elevated creatinine while the patient has been an inpatient.  This was first found with an ultrasound. CT urogram revealed a concentric narrowing of the left midureter.  As he is symptomatic with increase creatinine as well as flank pain, it was recommended that he undergo cystoscopy, retrograde, possible ureteroscopy, and stent placement.  Risks and complications have been discussed with the patient, he understands these, and desires to proceed. Dr. Aldean Ast has asked me to perform this procedure on an urgent basis.  DESCRIPTION OF PROCEDURE:  Identified myself in the holding room to the patient, as I had not met him before.  The surgical side was marked, he received preoperative IV Ancef.  After proper identification of the patient, he was taken to the operating room where general anesthetic was administered.  He was placed in the dorsal lithotomy position. Genitalia and perineum were prepped and draped.  Time-out was then performed.  I then proceeded with placing a 22-French panendoscope through the urethra.  The urethra was normal with exception of some mild narrowing. There was no  stricture.  There was no obstruction.  Bladder was entered after prostatic evaluation revealed no evidence of obstruction.  Bladder was inspected circumferentially.  No tumors, trabeculations, or foreign bodies were noted.  Both ureteral orifices were normal in configuration and location.  The left ureter was then cannulated with an open-end catheter, and retrograde performed.  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  This was approximately 2-3 cm in length.  It did not appear to be intrinsic, but it appeared there might be an extrinsic compression.  Proximal to this, there was significant hydronephrosis and hydroureter.    I navigated a guidewire by the stricture, up into the left renal pelvis.  The cystoscope was removed and then I proceeded with ureteroscopy with the 6-French short ureteroscope.  This was easily passed up to this area in question.  There was no evidence of urothelial abnormality in this area, I was able to pass the scope through this area.  I then removed the scope.  Cytologies were not obtained, as I did not see any sign of malignancy.  I then, over top of the guidewire, placed a 26 cm x 6-French contour double-J stent.  Good curls were seen proximally and distally once guidewire was removed.  The  bladder was drained and then a 16- [place] Foley catheter was placed and hooked to the dependent drainage.  The patient tolerated the procedure well.  He was awakened, extubated, taken to the PACU in stable condition.     Bertram Millard. [name], M.D.     SMD/MEDQ  D:  12/06/2010  T:  12/07/2010  Job:  [id number]  Electronically Signed by Marcine Matar M.D. on 12/07/2010 06:01:50 AM

## 2010-12-08 LAB — RENAL FUNCTION PANEL
BUN: 52 mg/dL — ABNORMAL HIGH (ref 6–23)
CO2: 38 mEq/L — ABNORMAL HIGH (ref 19–32)
Glucose, Bld: 131 mg/dL — ABNORMAL HIGH (ref 70–99)
Phosphorus: 1.9 mg/dL — ABNORMAL LOW (ref 2.3–4.6)
Potassium: 5.1 mEq/L (ref 3.5–5.1)
Sodium: 147 mEq/L — ABNORMAL HIGH (ref 135–145)

## 2010-12-08 LAB — PROTEIN ELECTROPH W RFLX QUANT IMMUNOGLOBULINS
Albumin ELP: 48.2 % — ABNORMAL LOW (ref 55.8–66.1)
Alpha-1-Globulin: 8.4 % — ABNORMAL HIGH (ref 2.9–4.9)
Alpha-2-Globulin: 15.7 % — ABNORMAL HIGH (ref 7.1–11.8)
Gamma Globulin: 17 % (ref 11.1–18.8)
Total Protein ELP: 7.1 g/dL (ref 6.0–8.3)

## 2010-12-08 LAB — GLUCOSE, CAPILLARY
Glucose-Capillary: 106 mg/dL — ABNORMAL HIGH (ref 70–99)
Glucose-Capillary: 133 mg/dL — ABNORMAL HIGH (ref 70–99)
Glucose-Capillary: 168 mg/dL — ABNORMAL HIGH (ref 70–99)
Glucose-Capillary: 161 mg/dL — ABNORMAL HIGH (ref 70–99)

## 2010-12-08 LAB — BLOOD GAS, ARTERIAL
Acid-Base Excess: 10.1 mmol/L — ABNORMAL HIGH (ref 0.0–2.0)
Delivery systems: POSITIVE
Drawn by: 29925
Inspiratory PAP: 15
Patient temperature: 97.6
pCO2 arterial: 75.7 mmHg (ref 35.0–45.0)
pH, Arterial: 7.303 — ABNORMAL LOW (ref 7.350–7.450)

## 2010-12-08 LAB — HEMOGLOBIN AND HEMATOCRIT, BLOOD: Hemoglobin: 8.1 g/dL — ABNORMAL LOW (ref 13.0–17.0)

## 2010-12-09 LAB — RENAL FUNCTION PANEL
CO2: 35 mEq/L — ABNORMAL HIGH (ref 19–32)
Calcium: 8.7 mg/dL (ref 8.4–10.5)
Creatinine, Ser: 2.42 mg/dL — ABNORMAL HIGH (ref 0.4–1.5)
GFR calc Af Amer: 33 mL/min — ABNORMAL LOW (ref >60)
GFR calc non Af Amer: 28 mL/min — ABNORMAL LOW (ref >60)
Phosphorus: 1.3 mg/dL — ABNORMAL LOW (ref 2.3–4.6)
Sodium: 142 mEq/L (ref 135–145)

## 2010-12-09 LAB — CBC
Hemoglobin: 8.1 g/dL — ABNORMAL LOW (ref 13.0–17.0)
MCH: 27.6 pg (ref 26.0–34.0)
MCHC: 30 g/dL (ref 30.0–36.0)
RDW: 13.2 % (ref 11.5–15.5)

## 2010-12-09 LAB — GLUCOSE, CAPILLARY: Glucose-Capillary: 156 mg/dL — ABNORMAL HIGH (ref 70–99)

## 2010-12-10 ENCOUNTER — Inpatient Hospital Stay (HOSPITAL_COMMUNITY): Payer: Medicare Other

## 2010-12-10 LAB — RENAL FUNCTION PANEL
Albumin: 2.9 g/dL — ABNORMAL LOW (ref 3.5–5.2)
CO2: 36 mEq/L — ABNORMAL HIGH (ref 19–32)
Calcium: 8.8 mg/dL (ref 8.4–10.5)
Chloride: 96 mEq/L (ref 96–112)
GFR calc Af Amer: 37 mL/min — ABNORMAL LOW (ref >60)
GFR calc non Af Amer: 30 mL/min — ABNORMAL LOW (ref >60)
Sodium: 141 mEq/L (ref 135–145)

## 2010-12-10 LAB — GLUCOSE, CAPILLARY
Glucose-Capillary: 117 mg/dL — ABNORMAL HIGH (ref 70–99)
Glucose-Capillary: 93 mg/dL (ref 70–99)
Glucose-Capillary: 149 mg/dL — ABNORMAL HIGH (ref 70–99)

## 2010-12-10 LAB — CBC
Hemoglobin: 7.5 g/dL — ABNORMAL LOW (ref 13.0–17.0)
Platelets: 155 10*3/uL (ref 150–400)
RBC: 2.78 MIL/uL — ABNORMAL LOW (ref 4.22–5.81)

## 2010-12-11 ENCOUNTER — Inpatient Hospital Stay (HOSPITAL_COMMUNITY): Payer: Medicare Other

## 2010-12-11 LAB — COMPREHENSIVE METABOLIC PANEL
ALT: <8 U/L (ref 0–53)
Alkaline Phosphatase: 42 U/L (ref 39–117)
BUN: 35 mg/dL — ABNORMAL HIGH (ref 6–23)
CO2: 35 mEq/L — ABNORMAL HIGH (ref 19–32)
GFR calc non Af Amer: 29 mL/min — ABNORMAL LOW (ref >60)
Glucose, Bld: 94 mg/dL (ref 70–99)
Potassium: 4.5 mEq/L (ref 3.5–5.1)
Sodium: 139 mEq/L (ref 135–145)
Total Bilirubin: 0.6 mg/dL (ref 0.3–1.2)
Total Protein: 6.8 g/dL (ref 6.0–8.3)

## 2010-12-11 LAB — BRAIN NATRIURETIC PEPTIDE: Pro B Natriuretic peptide (BNP): 71 pg/mL (ref 0.0–100.0)

## 2010-12-11 LAB — URIC ACID: Uric Acid, Serum: 9.4 mg/dL — ABNORMAL HIGH (ref 4.0–7.8)

## 2010-12-11 LAB — CBC
HCT: 25.2 % — ABNORMAL LOW (ref 39.0–52.0)
Hemoglobin: 7.5 g/dL — ABNORMAL LOW (ref 13.0–17.0)
MCV: 91.3 fL (ref 78.0–100.0)
RDW: 13.5 % (ref 11.5–15.5)
WBC: 7.3 10*3/uL (ref 4.0–10.5)

## 2010-12-12 LAB — GLUCOSE, CAPILLARY: Glucose-Capillary: 106 mg/dL — ABNORMAL HIGH (ref 70–99)

## 2010-12-13 LAB — COMPREHENSIVE METABOLIC PANEL
ALT: 12 U/L (ref 0–53)
Alkaline Phosphatase: 41 U/L (ref 39–117)
BUN: 33 mg/dL — ABNORMAL HIGH (ref 6–23)
CO2: 39 mEq/L — ABNORMAL HIGH (ref 19–32)
Calcium: 8.9 mg/dL (ref 8.4–10.5)
GFR calc non Af Amer: 30 mL/min — ABNORMAL LOW (ref >60)
Glucose, Bld: 118 mg/dL — ABNORMAL HIGH (ref 70–99)
Sodium: 141 mEq/L (ref 135–145)
Total Protein: 6.9 g/dL (ref 6.0–8.3)

## 2010-12-13 LAB — UIFE/LIGHT CHAINS/TP QN, 24-HR UR
Albumin, U: DETECTED
Beta, Urine: DETECTED — AB
Free Kappa Lt Chains,Ur: 20.6 mg/dL — ABNORMAL HIGH (ref 0.04–1.51)
Free Kappa/Lambda Ratio: 9.95 ratio — ABNORMAL HIGH (ref 0.46–4.00)
Free Lambda Excretion/Day: 78.14 mg/d
Free Lambda Lt Chains,Ur: 2.07 mg/dL — ABNORMAL HIGH (ref 0.08–1.01)
Gamma Globulin, Urine: DETECTED — AB
Time: 24 hours
Total Protein, Urine-Ur/day: 6025 mg/d — ABNORMAL HIGH (ref 10–140)

## 2010-12-13 LAB — GLUCOSE, CAPILLARY
Glucose-Capillary: 96 mg/dL (ref 70–99)
Glucose-Capillary: 81 mg/dL (ref 70–99)
Glucose-Capillary: 137 mg/dL — ABNORMAL HIGH (ref 70–99)
Glucose-Capillary: 111 mg/dL — ABNORMAL HIGH (ref 70–99)

## 2010-12-13 LAB — CBC
HCT: 24.2 % — ABNORMAL LOW (ref 39.0–52.0)
Platelets: 195 10*3/uL (ref 150–400)
RBC: 2.68 MIL/uL — ABNORMAL LOW (ref 4.22–5.81)
RDW: 13.4 % (ref 11.5–15.5)
WBC: 5.8 10*3/uL (ref 4.0–10.5)

## 2010-12-14 LAB — GLUCOSE, CAPILLARY: Glucose-Capillary: 116 mg/dL — ABNORMAL HIGH (ref 70–99)

## 2010-12-14 LAB — CBC
HCT: 29.7 % — ABNORMAL LOW (ref 39.0–52.0)
Hemoglobin: 8.8 g/dL — ABNORMAL LOW (ref 13.0–17.0)
MCH: 27 pg (ref 26.0–34.0)
MCHC: 29.6 g/dL — ABNORMAL LOW (ref 30.0–36.0)
RDW: 13.9 % (ref 11.5–15.5)

## 2010-12-14 LAB — BASIC METABOLIC PANEL
CO2: 37 mEq/L — ABNORMAL HIGH (ref 19–32)
Calcium: 9.6 mg/dL (ref 8.4–10.5)
Creatinine, Ser: 2.16 mg/dL — ABNORMAL HIGH (ref 0.4–1.5)
GFR calc Af Amer: 38 mL/min — ABNORMAL LOW (ref >60)
Glucose, Bld: 103 mg/dL — ABNORMAL HIGH (ref 70–99)

## 2010-12-14 NOTE — Discharge Summary (Signed)
NAMEMarland Kitchen  [name] [name], [name] [name]            ACCOUNT NO.:  [id number]  MEDICAL RECORD NO.:  [id number]           PATIENT TYPE:  I  LOCATION:  6705                         FACILITY:  [facility]  PHYSICIAN:  Brendia Sacks, MD    DATE OF BIRTH:  1950/10/25  DATE OF ADMISSION:  11/27/2010 DATE OF DISCHARGE:  12/01/2010                              DISCHARGE SUMMARY   PRIMARY CARE PHYSICIAN:  [name], MD  CONDITION ON DISCHARGE:  Improved.  DISCHARGE DIAGNOSES: 1. Acute renal failure, resolving. 2. Chronic kidney disease stage III to stage IV. 3. Recurrent epistaxis, stable. 4. Acute on chronic normocytic anemia with acute blood loss component     status post 2 units of packed red blood cells. 5. Diabetes mellitus type 2, stable. 6. Chronic obstructive pulmonary disease, oxygen-dependent, stable.  HISTORY OF PRESENT ILLNESS:  This is a 60 year old male who presented to the emergency room with epistaxis.  HOSPITAL COURSE: 1. Epistaxis.  The patient was admitted to the medical floor.  He did     require transfusion of 2 units packed red blood cells for acute     blood loss.  Bleeding did stop in the emergency room.  However, he     is noted to have acute renal failure and so was admitted.  The     patient will be following up with Dr. [name] next week.  He has had     occasional nose bleeds in the past but this was his most severe one     recently.  Given his coronary artery disease he has been continued     on aspirin and Plavix throughout this hospitalization and has had     no further bleeding.  We will have him follow up with his primary     care physician as well. 2. Acute renal failure.  His diuretics were held as was his ARB.  His     creatinine subsequently improved and he appears to be at a new     baseline.  I have reviewed his laboratory studies from Dr. Hazle Coca     office as well as Dr. Ermalene Searing office as will be detailed below.     He has had excellent urine output and  appears to be stable for     discharge.  He will follow up with Dr. Daphine Deutscher in the office next     week. 3. Diabetes mellitus type 2.  This has remained stable in fact without     any medications.  He is required minimal amount of insulin.  At     this point metformin appears to be contraindicated given his new     baseline of kidney function.  I will discuss the case with Dr.     Daphine Deutscher for her thoughts on the patient's outpatient medical     regimen. 4. Chronic normocytic anemia.  Follow up as clinically indicated. 5. Oxygen-dependent chronic obstructive pulmonary disease.  This does     appear to be stable. 6. Chronic diastolic function by history.  This appears to be stable.  CONSULTATIONS:  None.  PROCEDURES:  None.  Dictation ended at this point.     Brendia Sacks, MD     DG/MEDQ  D:  12/01/2010  T:  12/02/2010  Job:  [id number]  cc:   [name], M.D. Nanetta Batty, M.D. Clinton D. Maple Hudson, MD, [facility], FACP  Electronically Signed by Brendia Sacks  on 12/14/2010 09:11:02 PM

## 2010-12-14 NOTE — Discharge Summary (Signed)
[name] [name], [name] [name]            ACCOUNT NO.:  [id number]  MEDICAL RECORD NO.:  [id number]           PATIENT TYPE:  I  LOCATION:  6705                         FACILITY:  [facility]  PHYSICIAN:  Brendia Sacks, MD    DATE OF BIRTH:  Apr 04, 1951  DATE OF ADMISSION:  11/27/2010 DATE OF DISCHARGE:  12/01/2010                              DISCHARGE SUMMARY   ADDENDUM  IMAGING:  None.  MICROBIOLOGY:  None.  LABORATORY STUDIES: 1. Notable for his baseline hemoglobin does appear to be about 9-10.     On admission, at this time it was 8.2, decreased to 7.5, and on     discharge is 9.0, and stable and appears to be at baseline, mild     thrombocytopenia of 136, this does appear to be chronic in nature     dating to at least August 2010. 2. Blood sugar extremely well-controlled with a minimum amount of     insulin.  He has been on no oral medication. 3. Creatinine notable for being 3.22 on admission with a BUN of 57 and     a BUN of 42 on discharge, and creatinine of 2.76 and trending     downwards. 4. Previous data points include basic metabolic panel at Dr. Hazle Coca     office, November 04, 2010, with a BUN 95 and creatinine is 3.64, and     as well as September 03, 2010.  Laboratory study of this year with a BUN of 55 and creatinine of 1.72. Dr. Kenney Houseman [facility]'s office has creatinine of 2.4 in October of 2011, and 1.8 in December of 2011.  EXAM ON DISCHARGE:  The patient is feeling well.  He feels like he has a minor flare of gout in his ankles, but this is not severe, not to require medications, he will try to talk with Dr. Daphine Deutscher, about it, rather than be prescribed any medications.  Temperature is 99.5, pulse 61, respirations 20, blood pressure 164/88, sat 98% on 2 liters.  In general, well-developed, well-nourished man in no acute distress. Cardiovascular:  Regular rate and rhythm.  No murmur, rub, gallop. Respiratory is clear to auscultation bilaterally.  No wheezes, rales,  or rhonchi.  Normal respiratory effort.  Minimal lower extremity edema.  DISCHARGE INSTRUCTIONS:  The patient will be discharged home today.  His diet is a heart-healthy diet, diabetic diet.  Activities as tolerated. He will follow up with Dr. Christia Reading, Friday, December 09, 2010, at 12:55 p.m. with Dr. Alwyn Pea on December 06, 2010, at 10:30.  DISCHARGE MEDICATIONS: 1. Alprazolam 0.5 mg p.o. t.i.d. as needed. 2. Metoprolol 25 mg p.o. b.i.d. 3. Crestor 20 mg p.o. daily. 4. Iron 150 mg p.o. b.i.d. 5. Lasix 80 mg p.o. b.i.d. 6. Multivitamin p.o. daily. 7. Imdur 60 mg p.o. daily. 8. Aspirin 325 mg p.o. daily. 9. Plavix 75 mg p.o. daily. 10.Protonix 40 mg p.o. daily. 11.Potassium chloride 20 mEq p.o. b.i.d. 12.Claritin 10 mg p.o. daily as needed. 13.ProAir inhaler 2 puffs daily as needed. 14.Metolazone 2.5 mg 3 tablets Monday, Wednesday, and Friday.  Discontinue the following medications: 1. Losartan until followup with Dr. Daphine Deutscher. 2.  Metformin secondary to elevated creatinine.  THINGS FOLLOWING UP IN THE OUTPATIENT SETTING: 1. Continue monitoring of his kidney function, suggest repeat basic     metabolic panel in the outpatient setting. 2. Epistaxis.  Follow up with Dr. [name] as described above. 3. Alternative therapy. 4. Diabetes mellitus type 2, which I will discuss with Dr. Daphine Deutscher.  Time coordinating discharge including discussion with Dr. Alwyn Pea and coordinating the patient's care is 45 minutes.     Brendia Sacks, MD     DG/MEDQ  D:  12/01/2010  T:  12/02/2010  Job:  [id number]  Electronically Signed by Brendia Sacks  on 12/14/2010 09:13:17 PM

## 2010-12-14 NOTE — H&P (Signed)
NAMEMarland [name]  BAKARY, BRAMER NO.:  [id number]  MEDICAL RECORD NO.:  [id number]           PATIENT TYPE:  E  LOCATION:  [facility]                         FACILITY:  [facility]  PHYSICIAN:  Brendia Sacks, MD    DATE OF BIRTH:  [birth year]  DATE OF ADMISSION:  12/04/2010 DATE OF DISCHARGE:                             HISTORY & PHYSICAL   PRIMARY CARE PHYSICIAN:  [name], MD  REFERRING PHYSICIAN:  Dr. Annia Friendly.  CHIEF COMPLAINT:  Weakness.  HISTORY OF PRESENT ILLNESS:  This is a 60 year old man who was just recently admitted to the hospital from April 15 to April 19 for epistaxis requiring packed red blood cell transfusion as well as superimposed on chronic kidney disease stage III to stage IV.  The patient was feeling well at home until this morning when he woke up and he was confused and generally weak.  It was hard for him to get out of bed and his wife noticed that he was stumbling in the hallway.  It was noted that he was seen to be dragging his right leg.  The patient felt like he had no grip with his right hand and he had difficulty holding his spoon and he also noticed left hand numbness this morning as well.  He had somewhat mild generalized shakiness.  This appeared to be new and he had difficulty getting up out of his chair.  He was able to walk but again with difficulty and almost fell down.  Once sitting down was unable to really stand up.  His blood sugar was noted be in the 50s this morning.  He has been off his metformin secondary to his kidney disease and has not been on any other medications for diabetes and his blood sugars were decently controlled during his previous hospitalization with minimal sliding scale insulin.  In discussion with Dr. Allyson Sabal on discharge, the patient was discharged home on aspirin 81 mg daily.  Please note the discharge summary 5/325, however, the patient was actually discharged home on 81 mg.  The patient has not yet started  taking this medication, although he has continued to take his Plavix.  At this point, the weakness in his leg apparently seems to have improved, although it is difficult for him to really no.  He is certainly not willing to stand up.  He is not confident that he can do so.  REVIEW OF SYSTEMS:  Negative for fever, changes to his vision, sore throat, rash, muscle aches, chest pain, shortness of breath, nausea, vomiting, abdominal pain, diarrhea, dysuria, bleeding.  PAST MEDICAL HISTORY: 1. Epistaxis requiring transfusion in April 2012.  He has outpatient     followup with Dr. [name].  It was decided at previous discharge to     place him on aspirin 81 mg and Plavix.  He has had no recurrence. 2. Oxygen-dependent COPD at 3 liters per minute nasal cannula. 3. Obstructive sleep apnea.  He should wear CPAP at night but is not     fully compliant with this. 4. Chronic kidney disease stage not entirely clear at this point and     he  had a creatinine as high as 3 at Dr. Hazle Coca office in January     of this year and he was discharged with a creatinine recently of     2.7.  His creatinine has now been elevated in Dr. Ermalene Searing office     as well in the past. 5. Coronary artery disease. 6. Anemia of chronic disease. 7. Diabetes mellitus type 2. 8. No history of MI or CVA.  PAST SURGICAL HISTORY: 1. CABG. 2. Coronary stent placement. 3. Appendectomy. 4. Some kind of procedure on his right lung at [facility] in the past.  ALLERGIES: 1. ACE INHIBITOR, although he has in the past been on an ARB. 2. PERCOCET which causes hallucinations.  SOCIAL HISTORY:  He is an ex-smoker, no alcohol.  FAMILY HISTORY:  Mother died of an MI.  Father had some kind of heart problem.  PHYSICAL EXAMINATION:  VITAL SIGNS:  He is afebrile.  Temperature is 99.2.  Initial respiratory rate was 24, on repeat 18, pulse 100, blood pressure 130/66, sat 97%. HEENT:  Head appears to be normal.  Eyes:  Sclerae clear.  He is  sitting on the side of his bed and appears to be calm and comfortable.  Pupils are equal, round, react to light.  Lids, irises, conjunctivae appear unremarkable.  Hearing is grossly normal.  Dentition, gums, and tongue appear unremarkable. NECK:  Supple.  No lymphadenopathy or masses and no thyromegaly. CHEST:  Clear to auscultation bilaterally.  No wheezes, rales, or rhonchi.  There is normal respiratory effort.  No significant lower extremity edema. CARDIOVASCULAR:  Regular rate and rhythm.  No murmur, rub, or gallop. No significant lower extremity edema. ABDOMEN:  Soft, nontender, nondistended.  No masses are appreciated. SKIN:  Normal without rash or indurations.  Nontender to palpation. PSYCHIATRIC:  Grossly normal mood and affect.  He is alert and oriented to himself, presented to his location to the month and year. MUSCULOSKELETAL:  Tone and strength in the bilateral lower extremities is 4+/5 and appears to be symmetric.  Strength in the right upper extremity is 4+/5, left also 4+/5.  He has no pronator drift.  He has intact finger-to-nose bilaterally.  Speech is fluent and clear and cranial nerves II-XII are intact.  IMAGING:  Chest x-ray:  Cardiomegaly with congestive heart failure. Note the previous chest x-ray May 19 also showed pulmonary vascular congestion and interstitial edema as well as chest x-ray on January 24 of this year and March 23, 2009.  Given his clinical history, this appears to be chronic.  PERTINENT LABORATORY STUDIES:  1 . CBC is notable for a hemoglobin of 8.5 which appears to be stable and platelet count 133,000 which also appears to be stable. 1. Blood sugar here is 165, BUN 63 and creatinine is 3.86 which are     elevated. 2. Cardiac markers are negative. 3. EKG is independently reviewed and shows sinus tachycardia with a     rate of 100 and a right bundle-branch block.  Compared to previous     EKG dated February 17, 2010 at 5:13 a.m., right bundle  branch block is     old.  No acute changes are seen.  ASSESSMENT/PLAN:  This is a 60 year old man who presents with stroke symptomology. 1. Stroke symptomology.  Given his history of right lower extremity     weakness with the dragging of his foot this morning as well as     generalized weakness, left hand weakness, and hypoglycemia, I am  concerned that he may have had a stroke.  We will go ahead and     proceed with a stroke evaluation including CT scan of the brain,     MRI, and imaging as per protocol.  We will hold his blood pressure     medication, at this point is actually quite normotensive.  We will     place him back on a full-dose aspirin and is actually seen to be     tolerating that even before discharge, although he should have been     on baby aspirin at home.  He had not yet started any aspirin in the     outpatient setting.  I will continue him on Plavix at this point.     Further evaluation and treatment pending this workup. 2. Hypoglycemia.  The patient has not been on any medications as his     metformin has been held.  His blood sugars have been well-     controlled on previous hospitalization.  Certainly hypoglycemia can     cause stroke-like symptoms, however, his hypoglycemia quickly     resolved but his symptoms did not. 3. Mild tremors.  The patient does have a fine tremor which appears to     be benign in his bilateral upper extremities.  This does not appear     to be seizure activity and the etiology is unclear.  We will     continue to monitor. 4. Acute renal failure superimposed on chronic kidney disease, stage     unspecified.  It is not clear again as previously noted exactly     what his baseline is.  He did have a creatinine elevated up to 3 in     Dr. Hazle Coca office in January of this year and his creatinine has     been elevated in the past in Dr. Kenney Houseman Martin's office.  I think at     this point we will give him a little bit of fluids as this  improved     his creatinine during his last hospitalization.  I do note that his     chest x-ray suggests failure, however, previous chest x-rays have     suggested the same and clinically on his last hospitalization he     had no evidence of volume overload even though chest x-ray was     suggested.  I think it would be useful to involve Nephrology     tomorrow for their thoughts on this case. 5. Generalized weakness.  He has no focal deficits at this point.     Speech is generally weak.  Physical and occupational therapy will     evaluate him. 6. Chronic anemia and thrombocytopenia.  These appear to be stable at     this point. 7. Oxygen-dependent chronic obstructive pulmonary disease and     obstructive sleep apnea.  Both of these appear to be stable,     continues chronic oxygen therapy, encouraged him to use CPAP at     night. 8. History of coronary artery disease.  This appears to be stable.  CODE STATUS:  Full.  I discussed the admission with the patient, his sisters, and his wife with his permission and discussed the plan of care with them, all appear to be in agreement.     Brendia Sacks, MD     DG/MEDQ  D:  12/04/2010  T:  12/04/2010  Job:  [id number]  cc:   Kenney Houseman  Otis Brace, M.D.  Electronically Signed by Brendia Sacks  on 12/14/2010 09:13:28 PM

## 2010-12-15 LAB — GLUCOSE, CAPILLARY: Glucose-Capillary: 86 mg/dL (ref 70–99)

## 2010-12-15 LAB — BASIC METABOLIC PANEL
Calcium: 9.2 mg/dL (ref 8.4–10.5)
GFR calc Af Amer: 36 mL/min — ABNORMAL LOW (ref >60)
GFR calc non Af Amer: 30 mL/min — ABNORMAL LOW (ref >60)
Sodium: 141 mEq/L (ref 135–145)

## 2010-12-15 LAB — CBC
Platelets: 207 10*3/uL (ref 150–400)
RDW: 13.8 % (ref 11.5–15.5)
WBC: 5.6 10*3/uL (ref 4.0–10.5)

## 2010-12-15 NOTE — Consult Note (Signed)
[name] [name], [name] [name]            ACCOUNT NO.:  [id number]  MEDICAL RECORD NO.:  [id number]           PATIENT TYPE:  I  LOCATION:  2029                         FACILITY:  [facility]  PHYSICIAN:  Jordan Hawks. Elnoria Howard, MD    DATE OF BIRTH:  [birth year]  DATE OF CONSULTATION:  12/09/2010 DATE OF DISCHARGE:                                CONSULTATION   REASON FOR CONSULTATION:  Heme-positive stool and melena.  This is unassigned [facility] patient.  HISTORY OF PRESENT ILLNESS:  This is a 60 year old gentleman with a past medical history of end-stage COPD, chronic anemia, obstructive sleep apnea, renal disease, CHF, recent epistaxis with resultant transfusions, coronary artery stent placement, appendectomy, and lung surgery at [facility], who was admitted to the hospital with complaints of stroke-like symptoms.  Over the course of his hospitalization and workup, his stroke- like symptoms have apparently been stabilized and resolved, and he has been identified to have a left hydronephrosis.  Urology was consulted and stent was placed in.  It was uncertain if there was an intrinsic or extrinsic mass with regard to the hydronephrosis.  The patient subsequently had hypercarbic respiratory failure after the procedure and a Pulmonary Critical Care were consulted to help with manage his respirations and with institution of the BiPAP and then subsequently transitioned into the CPAP, the patient has been in stable condition. In the past, he smoked significant amount, but he quit tobacco in 2000. Recently, he was identified to have some dark stools, but it was uncertain if this was a melena or secondary to his iron supplementation. He has a history of chronic anemia during his admission and on December 05, 2010, he was found to be heme-positive.  As a result of his symptoms, a GI consultation was requested for further evaluation and treatment.  PAST MEDICAL HISTORY AND PAST SURGICAL HISTORY:  As stated  above.  FAMILY HISTORY:  Noncontributory.  SOCIAL HISTORY:  Negative for alcohol, tobacco, or illicit drug use.  REVIEW OF SYSTEMS:  As stated above in the history present illness, otherwise negative.  MEDICATIONS:  Please see the e-chart for full details.  PHYSICAL EXAMINATION:  VITAL SIGNS:  Stable.  The patient is on 3 liters nasal cannula, saturating in the 90s range. GENERAL:  The patient is in no acute distress, alert and oriented. HEENT:  Normocephalic, atraumatic.  Extraocular muscles intact. NECK:  Supple.  No lymphadenopathy. LUNGS:  Clear to auscultation bilaterally although some distant lung sounds. ABDOMEN:  Morbidly obese, soft, nontender, and nondistended. EXTREMITIES:  No clubbing, cyanosis, or edema.  LABORATORY VALUES:  Hemoglobin is in the 8 range.  IMPRESSION: 1. Heme-positive stool. 2. Questionable melena. 3. End-stage chronic obstructive pulmonary disease and recent     hypercarbic respiratory failure after the ureteral stent placement.  The patient reports having some dark stools but he is on iron.  Although the patient was also noted to have a heme-positive stool on December 05, 2010, his anemia can be multifactorial.  Certainly, he is passing blood in his urine at this time and is associated with clots.  He does not have any overt GI symptoms, meaning abdominal pain,  nausea, vomiting, or obvious history of peptic ulcer disease.  From the GI standpoint, I do feel that he would benefit from EGD, but I am extremely concerned about his hypercarbic respiratory failure.  I do not feel that he emergently need an EGD and his lung status is quite tenuous at this time.  I think the focus should be on the urologic evaluation.  However, GI will follow along and just plan according to his clinical course.  PLAN: 1. To follow hemoglobin. 2. Transfuse as necessary. 3. GI will continue to follow his clinical course.     Jordan Hawks Elnoria Howard, MD     PDH/MEDQ  D:   12/09/2010  T:  12/10/2010  Job:  [id number]  Electronically Signed by Jeani Hawking MD on 12/15/2010 07:23:47 AM

## 2010-12-16 LAB — CROSSMATCH
ABO/RH(D): O POS
Antibody Screen: NEGATIVE
Unit division: 0

## 2010-12-16 LAB — BASIC METABOLIC PANEL
BUN: 28 mg/dL — ABNORMAL HIGH (ref 6–23)
GFR calc non Af Amer: 33 mL/min — ABNORMAL LOW (ref >60)
Glucose, Bld: 103 mg/dL — ABNORMAL HIGH (ref 70–99)
Potassium: 4.9 mEq/L (ref 3.5–5.1)

## 2010-12-16 LAB — CBC
HCT: 29.1 % — ABNORMAL LOW (ref 39.0–52.0)
MCV: 89.3 fL (ref 78.0–100.0)
RDW: 13.4 % (ref 11.5–15.5)
WBC: 6.3 10*3/uL (ref 4.0–10.5)

## 2010-12-16 NOTE — Consult Note (Addendum)
NAMEMarland Kitchen  [name] [name], [name] [name]            ACCOUNT NO.:  [id number]  MEDICAL RECORD NO.:  [id number]           PATIENT TYPE:  I  LOCATION:  3304                         FACILITY:  [facility]  PHYSICIAN:  [name] Evener, MD  DATE OF BIRTH:  06/20/1951  DATE OF CONSULTATION:  12/07/2010 DATE OF DISCHARGE:                                CONSULTATION   CONSULTING PHYSICIAN:  [name] Scott, MD from [facility].  CHIEF COMPLAINT:  Acute on chronic respiratory failure.  HISTORY OF PRESENT ILLNESS:  [name] [name] is a 60 year old African American male with past medical history of chronic respiratory failure who was followed by [name] [name] at [facility] for O2-dependent COPD and obstructive sleep apnea.  He is known to wear 3 liters of oxygen continuously at home with BiPAP with last pressures of 13/10.  In the past, [name] [name] has had difficulties with tolerating BiPAP secondary to mouth dryness and sinus congestion. Most recent PFTs noted to be from July 2011 with severe restriction and FVC of 1.51 and mild-to-moderate obstruction with an FEV-1 of 1.11/31% and DLCO severely reduced at 37%/corrects volume.  [name] [name] was admitted to [facility] on December 04, 2010 with acute confusion and generalized weakness.  He was recently admitted to the hospital from November 27, 2010 to December 01, 2010 for epistaxis requiring packed red blood cell transfusion.  MRA of the brain was noted to have no acute or reversible findings.  [name] [name] was also noted to have acute on chronic renal failure with left hydronephrosis.  This is continuing to be evaluated by [facility].  There was a mass noted on ultrasound at the level of the iliacs.  On admission, he also was noted to be hypoglycemic with blood sugars in the 50s.  Throughout hospital course, his mental status improved.  On the day of April 25th, he was noted to have increased somnolence  prompting ABG assessment, which demonstrated a pH of 7.0, pCO2 of 144, and pO2 of 212 with a bicarbonate of 39.9.  Pulmonary and Critical Care was consulted for lethargy and hypercarbic respiratory failure.  At time of his examination, [name] [name] is drowsy, but arousable, alert and oriented when awakened.  In the past, there has been question about [name] [name] compliance with BiPAP at home.  He will be currently transferred to step-down unit for BiPAP therapy as well as chest x-ray will be evaluated, BNP, and a followup ABG 1 hour post BiPAP initiation. Given [name] [name] pO2 on ABG, there is some question whether or not he unfortunately may have received high flow O2 during this acute episode of lethargy and shortness of breath.  It was recommended that the CO2 be weaned down to minimal to maintain oxygen saturation of approximately 98%.  Also at this time, it was recommended to minimize narcotics in the setting of chronic kidney disease and chronic respiratory failure.  Currently, there does not appear to be any evidence of acute exacerbation of COPD or acute infectious process.  ALLERGIES:  ACE INHIBITORS and PERCOCET.  PAST MEDICAL HISTORY: 1. Oxygen-dependent COPD with 3 liters per nasal  cannula. 2. Obstructive sleep apnea. 3. Chronic kidney disease with a recent discharge creatinine of 2.7. 4. Coronary artery disease. 5. Anemia of chronic disease. 6. Recent admission for epistaxis in April 2012, requiring     transfusion. 7. Diabetes mellitus.  PAST SURGICAL HISTORY: 1. CABG. 2. Coronary stent placement. 3. Appendectomy. 4. VATS at [facility].  SOCIAL HISTORY:  [name] [name] is a former smoker with a pack a day prior to 2000.  He drinks alcohol rarely.  FAMILY HISTORY:  His father died a sudden death, questionable myocardial infarction and he has a sister with diabetes mellitus.  REVIEW OF SYSTEMS:  Denies headaches, dizziness.  Does not discuss  his recent nosebleeds; however, none noted currently.  He indicates lower back pain.  Denies shortness of breath, chest pain on inspiration, sputum production, or wheezing.  He also denies diarrhea, constipation, or blood in the stool.  No vomiting.  All other systems reviewed and are negative.  PHYSICAL EXAMINATION:  VITAL SIGNS:  Temperature 97.7, pulse 83, respirations 19, blood pressure 112/64, oxygen saturation 100% on 3 liters per nasal cannula; however, given pO2 on ABG, there is concern that he has received higher-flow O2. GENERAL:  He is a well-developed and well-nourished male in no acute distress. NEUROLOGIC:  He is drowsy, however, awakens easily and is oriented x3. HEENT:  Mucous membranes are pink and dry. NECK:  No JVD or carotid bruits. LUNGS:  Respirations are even and unlabored.  Lungs bilaterally are essentially clear.  No wheezing. CARDIOVASCULAR:  S1 and S2, regular rate and rhythm, no murmurs, rubs, or gallops. ABDOMEN:  Obese, soft.  Mild CVA tenderness.  No masses or hepatosplenomegaly noted.  He does have a noted umbilical hernia, which is easily reducible. EXTREMITIES:  Full range of motion with minimal edema in the lower extremities. SKIN:  Warm and dry.  IMPRESSION AND PLAN: 1. Altered mental status secondary to hypercarbic respiratory failure     with underlying obstructive sleep apnea/chronic obstructive     pulmonary disease.  [name] [name] is a known oxygen-dependent     chronic obstructive pulmonary disease the patient who was followed     by [name] [name] for chronic obstructive pulmonary disease and     obstructive sleep apnea.  He previously was on BiPAP; however, has     been questionable with compliance in the past.  His most recent     pressures were 13/10.  He was followed by advanced home care at     home for BiPAP needs.  Currently, there was no evidence of     bronchospasm or acute pulmonary infection.  He will be transferred      to step-down unit for further closer observation.  It was     recommended that he will be placed on BiPAP therapy, chest x-ray     will be examined as well as BNP and ABG 1 hour post placement of     BiPAP.  Oxygen to be titrated for oxygen saturation between 88 and     92%.  It was recommended that he continue on BiPAP therapy     nocturnally while inpatient.  Given the patient's ABG evaluation,     there is concern that he did possibly receive higher-flow O2     inadvertently during hospital course.  It was recommended again     that he not have minimize narcotics as able and titrate oxygen to     keep saturations between 88 and 92%.  We will follow along with     you.  Thank you for this consultation.  Hopefully, we will be able     to avoid intubation and mechanical ventilation. 2. Acute on chronic renal failure with current left hydronephrosis     with a questionable mass. 3. Anemia of chronic disease and question of iron deficiency anemia. 4. Diabetes mellitus. 5. Hypertension.  Please note that problems 2 through 5 are being     managed by [facility].     [name], NP   ______________________________ [name] Evener, MD    BO/MEDQ  D:  12/07/2010  T:  12/08/2010  Job:  [id number]  Electronically Signed by [name]  on 12/16/2010 02:23:48 PM Electronically Signed by Koren Bound MD on 01/02/2011 03:15:26 PM

## 2010-12-18 NOTE — Group Therapy Note (Signed)
[name] [name], [name] [name]            ACCOUNT NO.:  [id number]  MEDICAL RECORD NO.:  [id number]           PATIENT TYPE:  O  LOCATION:  DAY                          FACILITY:  [facility]  PHYSICIAN:  Erick Blinks, MD     DATE OF BIRTH:  19-Sep-1950                                PROGRESS NOTE   PRIMARY CARE PHYSICIAN: Cephas Darby. Daphine Deutscher, MD  CURRENT PROBLEM LIST: 1. Metabolic encephalopathy, multifactorial, improving. 2. Acute on chronic hypercarbic respiratory failure, on CPAP,     improving. 3. Obstructive sleep apnea. 4. Acute renal failure, improving. 5. Left-sided hydronephrosis status post double-J stent. 6. Hematuria secondary to recent urologic procedure. 7. Anemia of chronic disease, iron deficiency anemia. 8. History of coronary artery disease status post CABG with drug-     eluting stent placed in July 2011 on Plavix. 9. Hypertension. 10.Type 2 diabetes. 11.Obesity. 12.Oxygen dependent COPD. 13.Heme-positive stools. ADMISSION HISTORY: This is a 60 year old African American male who was recently admitted to the hospital from April 15 to April 19 with epistaxis requiring packed red cell transfusion.  The patient was at home and was doing fairly well until he woke up on the morning of admission when he felt generally weak and confused.  His wife noticed that he was stumbling in the hall.  The patient described that he had no grip in his right hand and had difficulty holding his food.  He did also endorse some generalized tremors.  His blood sugar was noted to be in the 50s on the morning of admission.  The patient was found to be in acute renal failure and was admitted for further workup of his stroke symptomatology.  For further details, please refer to the history and physical dictated by Dr. Irene Limbo on April 22.  HOSPITAL COURSE: 1. Metabolic encephalopathy.  It was felt that much of the patient's     symptoms were secondary to uremia from his acute renal failure.  He    did have a stroke workup and no acute stroke was found on MRI.  His     bilateral carotid Doppler's did show 60-79% stenosis bilaterally in     the ICA.  This will need to be followed as an outpatient.  The     patient was admitted to medical floor and was doing fairly well.     He had further workup for his acute renal failure and was found to     have left-sided hydronephrosis.  He subsequently went to [facility] for double-J stent placement.  After returning from [facility], the patient was noted to be increasingly somnolent.     An ABG was checked at that time and the patient was shown to have     hypercarbic respiratory acidosis with a pCO2 of 140.  He was     subsequently transferred to the step-down unit and placed on BiPAP.     With the institution of BiPAP, his mental status has significantly     improved and is currently back to baseline.  He is only on CPAP at  night and his respiratory acidosis has since resolved. 2. Acute on chronic hypercarbic respiratory acidosis.  The patient     does have a history of oxygen-dependent COPD as well as obstructive     sleep apnea and he is often noncompliant with his CPAP.  It is     likely that the patient was transferred to [facility] and     underwent cystoscopy and did not quite recover from the sedation     from this.  He was seen in consultation by the Pulmonary Critical     Care Medicine and is continued on CPAP at night.  His oxygen is     being titrated to keep his sats between 88-92.  His mental status     has since resolved and he will need to be continued on CPAP at     night. 3. Hydronephrosis.  The patient was seen in consultation by Dr. Annabell Howells     from the Urology Service.  He was transported to [facility] for double-J stent placement.  This was done by Dr.     [name].  It was noted on his CT scan that there may be a blood     clot causing his hydronephrosis.  On further  conversations with Dr.     [name], no clear cause of the hydronephrosis was found.  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  This was approximately 2-3 cm     in length and did not appear to be intrinsic but it appeared there     might be an extrinsic compression.  Review of the patient's CT scan     did not show any definite extrinsic mass that was compressing the     ureter, although granted this was a noncontrast CT.  There is     concern that there may be a GI source possibly pressing on the     ureter.  We have consulted at Dr. Elnoria Howard to see the patient today and     we will await further recommendations from him.  More detailed     imaging including an MRI of the abdomen may be considered, although     this may be difficult with the patient's respiratory status and     inability to lie flat for a prolonged period of time.  Although the     patient has tolerated an MRI of the head during this admission, we     will order an MRI of the abdomen at this time to see if there are     any new findings.  Other options would be to consider CT of the  abdomen with contrast once the patient's creatinine has improved or     possibly a repeat noncontrast CT in the next few months to see if     there is any change.  There was not noted to be any signs of     malignancy in the bladder or through the remainder of the     cystoscopy 4. Acute renal failure.  It appears that the patient's creatinine was     1.2-1.3 approximately a year ago.  He is was seen in consultation     by Dr. Arrie Aran from the Nephrology Service.  He has been gently     rehydrating the patient and it  appears that his renal function is     slowly improving.  We will need to continue with hydration at this     point closely following his in's and outs. 5. Hematuria.  The patient was having some hematuria and some passage     of clots today.  This again  was discussed with Dr. [name] and it     was felt that these findings would be expected after the     cystoscopy.  As long as the patient is not having any urinary     retention secondary to clots, we will continue to watch him for     now. 6. Anemia.  The patient's anemia is multifactorial and somewhat     chronic in nature.  This may be secondary to underlying kidney     disease and he was started on Aranesp by Dr. Arrie Aran.  He also     endorses dark-colored stool and his stool did test positive for     Hemoccult here in the hospital.  We have asked Dr. Jeani Hawking to     see the patient in consultation to see if any further intervention     is necessary at this point. 7. Coronary artery disease.  The patient had a stent placed less than     a year ago which is drug-eluting.  He is currently on Plavix.  We     will continue Plavix at this time unless the patient has a     precipitous drop in hemoglobin or his hematuria becomes     significantly worse.  His hemoglobin seems to be stable at this     time.  If the Plavix does need to be held, we may need to contact     his cardiologist, Dr. Allyson Sabal.  CONSULTATIONS: 1. Nephrology, Dr. Arrie Aran for acute renal failure. 2. Urology, Dr. Annabell Howells and Dr. [name] for hydronephrosis. 3. Dr. Molli Knock from Pulmonary Critical Care Medicine for respiratory     failure. 4. Dr. Elnoria Howard from GI for heme-positive stools/anemia.  PROCEDURES: Cystoscopy on April 25 with results as above.  DIAGNOSTIC IMAGING: 1. Chest x-ray on admission shows cardiomegaly with congestive heart     failure, worsened bibasilar aeration compared with prior     examination. 2. MRI of the brain on April 23 shows no acute reversible findings to     small foci of susceptibility artifact in the parietal white matter,     one on each side is clearly related to old closed head trauma or it     could be cavernous angiomas and it should be of acute clinical      relevance. 3. MRA of the head on April 23 shows normal intracranial MR     angiography of the large and medium sized vessels. 4. Renal ultrasound on April 23 shows moderate left-sided     hydronephrosis and absent left ureteral jet consistent with     obstructive uropathy, cause indeterminate, normal appearing right     kidney. 5. CT abdomen and pelvis on April 23 shows mild left hydronephrosis     and hydroureter approximately with no definite calculus.  Consider     blood clot and possibly left ureteral lesion, is there any history     of hematuria clinically, pleural thickening and calcification of     the floor at both lung bases, possible extensive asbestosis or     prior hemothorax with resultant fibrothorax, pneumonia in the left  lower lobe cannot be excluded, small gallstones, multiple     rectosigmoid colonic diverticula. 6. Chest x-ray from April 25 shows persistent CHF pattern. 7. A 2-D echocardiogram from April 23 shows left ventricular size was     normal, severe concentric hypertrophy, systolic function was     normal, ejection fraction 55-60%, and grade 1 diastolic     dysfunction.  No regional wall motion abnormalities, mild stenosis     with aortic valve.     Erick Blinks, MD     JM/MEDQ  D:  12/09/2010  T:  12/09/2010  Job:  [id number]  Electronically Signed by Durward Mallard MEMON  on 12/18/2010 01:35:36 AM

## 2010-12-23 NOTE — Discharge Summary (Signed)
[name] [name], [name]            ACCOUNT NO.:  [id number]  MEDICAL RECORD NO.:  [id number]           PATIENT TYPE:  I  LOCATION:  2029                         FACILITY:  [facility]  PHYSICIAN:  Calvert Cantor, M.D.     DATE OF BIRTH:  02-26-1951  DATE OF ADMISSION:  12/04/2010 DATE OF DISCHARGE:  12/16/2010                              DISCHARGE SUMMARY   PRIMARY CARE PHYSICIAN:  Dr. Alwyn Pea.  PRESENTING COMPLAINT:  Generalized weakness.  DISCHARGE DIAGNOSES: 1. Generalized weakness thought to be secondary to metabolic     encephalopathy, and acute on chronic renal failure. 2. Acute renal failure on chronic kidney disease. 3. Left-sided hydronephrosis, status post stenting. 4. Acute on chronic respiratory failure requiring treatment with BiPAP     during hospital stay. 5. Urinary retention secondary to obstruction of outflow tract with     clots. 6. Anemia requiring 2 units of packed red blood cells. 7. Hemoccult positive stools workup not yet complete. 8. Anemia of chronic disease/iron deficiency anemia.  PAST MEDICAL HISTORY: 1. Coronary artery disease status post CABG with drug-eluting.  The     patient is on aspirin and Plavix. 2. Grade 1 diastolic dysfunction noted on echo on December 05, 2010 with     severe LVH and concentric hypertrophy. 3. Oxygen dependent chronic obstructive pulmonary disease/chronic     respiratory failure.  The patient is on 3 liters of oxygen     continuously. 4. Obstructive sleep apnea.  The patient apparently has a CPAP, but     not always compliant. 5. Diabetes mellitus type 2, currently diet controlled. 6. Anemia of chronic disease.  DISCHARGE MEDICATIONS:  New medication. 1. Tamsulosin 0.4 mg p.o. daily as started by Urology.  He can     continue on all other medications which he was taking at home.     These are as follows. 2. Alprazolam 0.5 mg t.i.d. as needed for anxiety. 3. Aspirin 81 mg daily. 4. Claritin 1 tablet daily as  needed. 5. Crestor 20 mg daily. 6. Furosemide 80 mg twice a day. 7. Iron 150 mg twice a day. 8. Isosorbide mononitrate XR 60 mg daily. 9. Metolazone 2.5 mg 3 tablets Monday, Wednesday, Friday. 10.Metoprolol 25 mg twice a day. 11.Multivitamin 1 tablet daily. 12.Plavix 75 mg daily. 13.Potassium chloride 20 mEq twice a day. 14.ProAir inhaler 2 puffs as needed. 15.Protonix 40 mg daily.  CONSULTS:  During the hospital stay. 1. Nephrology consult with Dr. Terrial Rhodes. 2. Urology consult with Dr. Annabell Howells and the patient was later seen by     Dr. Aldean Ast. 3. Pulmonary critical care consult with Dr. Koren Bound. 4. GI consult with Dr. Elnoria Howard.  PROCEDURES AND IMAGING; 1. December 07, 2010.  The patient underwent cystoscopy, left retrograde     ureteropyelogram, left urethroscopy and double-J stent placement on     the left performed by Dr. [name]. 2. MRA head performed December 05, 2010 did not reveal any acute or     reversible findings.  He had two small foci of susceptibility     artifact in the parietal white matter one on each side 3-mm each.  These could relate to old closed head trauma or could be incidental     occlusive cavernous angiomas. 3. MRA of the head revealed normal intracranial MR angiography of the     large and medium-sized vessel. 4. Ultrasound of the renals revealed moderate left-sided     hydronephrosis and absent left ureter jet consistent with     obstructive uropathy causing indeterminate normal appearing right     kidney. 5. CT abdomen and pelvis without contrast revealed mild left-sided     hydronephrosis and hydroureter approximately with no definite     calculus consider blood clot or possibly left ureteral lesions also     noted was pleural thickening and calcification of pleura at both     lung bases, possible changes of asbestosis or prior hemothorax with     resultant fibrothorax.  Pneumonia in left lower lobe cannot be     excluded.  Small  gallstones.  Multiple rectosigmoid colon     diverticula. 6. Chest x-ray portable on April 25 for shortness of breath, revealed     persistent CHF pattern. 7. MRI of abdomen and pelvis without contrast on December 10, 2010     revealed fluid-filled levels in the mildly dilated left renal     collecting system with some separate filling defects in the     collecting system.  Suspect this represent hemorrhage in the     collecting system with warm clots as well.  Fungus ball could have     a similar appearance.  Transitional cell carcinoma with associated     hemorrhage could possibly have this appearance.  There is likely     some casting clot within the left ureter.  No surrounding     adenopathy is evident.  Left pyonephrosis is considered less likely     giving the filling defects.  Filling defects due to sloughed     papillary necrosis is also considered less likely.  Cholelithiasis.  X-ray of left knee two-view on December 10, 2010 revealed degenerative spurring of the patella.  Chest x-ray portable December 11, 2010 revealed persistent moderate cardiomegaly with mild pulmonary vascular congestion, perhaps minimally improved aeration.  Pertinent blood work hemoglobin dropped to a low of 7.3 during hospital stay after 2 units of packed red blood cells it is 8.6 today.  BUN is 28 and creatinine is 2.07 on discharge today.  Hemoglobin A1c was 6.1.  Lipid levels were checked and essentially normal except for an HDL that was low at 29.  Iron level was 16, total iron binding capacity 220, percent saturation 7, UIBC 204.  Urine protein electrophoresis did not detect an M spike.  Total urine protein was high at 6025.  Fecal occult blood was noted to be positive.  HOSPITAL COURSE:  This is a 60 year old male who was admitted on December 04, 2010 with complaint of generalized weakness initially was thought that he may have had a CVA as he did complain of dragging of his right foot.  However  in the ER apparently this had resolved.  Therefore, it was never noted by hospital physicians.  He was also noted to have acute renal failure on his chronic kidney disease.  Stroke workup was done which included an MRI results are mentioned above.  This was negative for a CVA, it was eventually suspected that the patient had generalized weakness from acute kidney injury.  Acute renal failure.  BUN on admission was noted to be 63 with a creatinine  of 3.86.  His baseline was creatinine of around 2. Further workup included an ultrasound of the kidneys which revealed hydronephrosis on the left, both Renal and Urology consults were requested.  The patient underwent a cystoscopy and double-J stent placement by Dr. [name] also mentioned above.  He subsequently continued to have hematuria.  Source of hydronephrosis has yet to be determined.  An MRI of the abdomen and pelvis was ordered to see if there was any extrinsic compression, but no obvious compression was noted.  It was noted that he had some fluid levels in the pelvis which was likely the source of his hematuria.  During the hospital stay his urine began to clear up.  However on May 3, the patient developed urinary retention.  At this point a Urology consult was again requested.  The patient was evaluated by Dr. Aldean Ast, a Foley catheter was placed and large amount of blood and clots were evacuated.  Dr. Aldean Ast has now recommended that the Foley be maintained for at least another 2 weeks after which the patient will follow up in his office for a voiding trial.  At this point holding his aspirin and Plavix has not been recommended by Urology.  Urine is still blood tinged, but that he is no longer passing any clots.  The patient will be going home with a Foley catheter.  He and his wife will be instructed how to flush it in case obstruction occurs.  Home health RNs will also be following.  He is advised to call Urology if there  are any issues.  He has also been placed on Flomax by Dr. Aldean Ast.  His acute renal failure has improved.  We did have to hold his Lasix and metolazone temporarily.  They are now being resumed on discharge as noted above, BUN and creatinine have improved to 28 and 2.07.  Heme-positive stools were noted as part of a workup for his anemia.  GI consult was requested.  He was evaluated by Dr. Elnoria Howard, at that point the patient had also developed some respiratory failure and had required a CPAP, therefore further GI eval was not performed during hospital stay. Dr. Elnoria Howard is agreeable to following the patient as an outpatient.  He is not sure if an endoscopy will be a possibility due to the patient's severe lung disease.  It may be more of a risk for him.  Patient is on iron, iron levels were checked and found to be low.  He is advised to continue his iron, follow up should be done as an outpatient.  We did transfuse 2 units of packed red blood cells as his hemoglobin drifted down to 7, it is now above 8.  But it is recommended that hemoglobin be followed as an outpatient especially if he continues to have a bloody urine.  The patient did developed acute respiratory failure on December 07, 2010. This was treated with BiPAP and eventually resolved.  The patient has been evaluated by PT/OT and will receive home health PT at home.  Also as mentioned home health RN will be followed to help assist with Foley management.  PHYSICAL EXAM:  LUNGS:  Clear. HEART: Regular rate and rhythm.  No murmurs. EXTREMITIES:  No cyanosis, clubbing, or edema. ABDOMEN:  Soft, nontender, nondistended.  Bowel sounds positive.  CONDITION ON DISCHARGE:  Stable.  FOLLOWUP INSTRUCTIONS: 1. The patient is advised to follow up with Dr. Alwyn Pea in 1     week.  A hemoglobin and renal  panel should be done at this point.     I am resuming his Lasix and metolazone and therefore he should be     checked for dehydration  from these diuretics as well. 2. He is advised to follow up with Dr. Aldean Ast.  I have called the     office and made an appointment for May 16 at 2:30. 3. He can follow up with Dr. Elnoria Howard after his acute urological issues     have resolved, for further workup of Hemoccult positive stools.  DISCHARGE DIET:  Low sodium heart healthy diabetic diet.  The plan for follow-up has been explained to the patient and his wife and they are in agreement with this.  TIME ON DISCHARGE:  75 minutes.     Calvert Cantor, M.D.     SR/MEDQ  D:  12/16/2010  T:  12/16/2010  Job:  [id number]  cc:   [name], M.D. Courtney Paris, M.D. Jordan Hawks Elnoria Howard, MD  Electronically Signed by Calvert Cantor M.D. on 12/23/2010 02:53:29 PM

## 2010-12-27 NOTE — Cardiovascular Report (Signed)
NAMETITUS, DRONE            ACCOUNT NO.:  [id number]   MEDICAL RECORD NO.:  [id number]          PATIENT TYPE:  INP   LOCATION:  2502                         FACILITY:  [facility]   PHYSICIAN:  Thereasa Solo. Little, M.D. DATE OF BIRTH:  1950-09-14   DATE OF PROCEDURE:  03/29/2009  DATE OF DISCHARGE:                            CARDIAC CATHETERIZATION   INDICATIONS FOR TEST:  This 60 year old male had previous bypass  surgery.  He underwent cath and intervention on March 25, 2009.  At  that time, there was a stent placed to the ostium of the saphenous vein  graft to the PDA.  He had residual circumflex disease and because of  this he was brought to the Cath Lab.   After obtaining informed consent, the patient was prepped and draped in  the usual sterile fashion exposing the right groin.  Following local  anesthetic with 1% Xylocaine with the Seldinger technique employed, a 6-  [place] introducer sheath was placed in the right femoral artery.  Evaluation of the circumflex system was then performed with a JL-4 guide  catheter.  This gave very poor backup and finally a 6-French Voda  catheter     was used for appropriate backup.   Evaluation of the circumflex system revealed 3 lesions.  The ostium of  the circumflex which was looked at very closely had less than 50% area  of narrowing.  It did not appear to be flow limiting.  The ostium of the  OM where there was a stent in the ostium and proximal portion was 75%  stenosed and was hazy.  Just distal to the takeoff of the OM was another  hazy area that was about 75% narrowed.  The ongoing circumflex was much  larger than the OM vessel.   Arrangements for intervention were then undertaken.  The patient was  given IV Angiomax and obtained an ACT of 334.   TOTAL CONTRAST:  190 mL.   HEMODYNAMICS:  Blood pressure during the procedure was 113/74, heart  rates were in the 70s with a bundle-branch block.   Initially, a short Luge wire was  placed down the OM vessel.  Following  this, a Prowater wire was placed on the circumflex system.   A 2.75 x 10 cutting balloon was then placed into the ostium and the  proximal portion of the OM, so that the area of 75% stenosis and that  was hazy was well covered.  The initial inflation was 10 atmospheres for  35 seconds, then 8 for 45, then 12 for 40.  Following this, the area  that had been hazy and 70% pre-intervention is now less than 20%  narrowed with no evidence any focal dissection or thrombus.   Attention was then addressed to the circumflex just distal to the OM.  A  2.75 x 15 Xience stent was placed in such a manner that it did not jail  the ostium of the OM.  It well covered the lesion.  Distally, it was  initially deployed at 14 atmospheres for 35 seconds with a final  inflation being 12 atmospheres for 35 seconds.  The area that was hazy  pre-intervention and 75% narrowed now appeared to be less than 10%  narrowed.   This same stent was then postdilated with a 3.0 x 12 Quantum apex, 12 x  42 and 5 x 40.   The Angiomax was discontinued.  The patient's pressure now is in the 140-  150 range, p.r.n. labetalol has been ordered.  He should be ready for  discharge tomorrow.           ______________________________  Thereasa Solo Little, M.D.     ABL/MEDQ  D:  03/29/2009  T:  03/30/2009  Job:  [id number]   cc:   [name], M.D.

## 2010-12-27 NOTE — Assessment & Plan Note (Signed)
[facility]                             PULMONARY OFFICE NOTE   NAME:[name] [name], [name] [name]                   MRN:          [id number]  DATE:04/29/2007                            DOB:          [birth year]    PROBLEM:  1. Chronic obstructive pulmonary disease.  2. Chronic upper respiratory failure.  3. Obstructive sleep apnea.  4. Obesity hypoventilation.  5. Cor pulmonale.  6. Coronary disease/bypass graft/stent.  7. Diastolic dysfunction with dilated cardiomyopathy.   HISTORY:  Feels well today, at least for him.  Continues oxygen between  2 and 2-1/2 liters and says he sleeps well with BiPAP, sometimes putting  it on to rest during the day, occasional chest pain/twinge he says while  on his treadmill or exercycle.  He is aware he has gained weight, little  sputum.   MEDICATION:  1. Diovan 80 mg.  2. Plavix 75 mg.  3. Furosemide 80 mg.  4. Crestor 20 mg.  5. Glipizide 5 mg.  6. Potassium 20.  7. Sular 20.  8. Isosorbide 30 mg.  Note correction in medications, apparently he      says he is now on 20 mg of isosorbide b.i.d.  9. Metolazone 2.5 mg.  10.Aspirin 81 mg.  11.Combivent.  12.Advair 250/50.   OBJECTIVE:  Weight 335 pounds, which is up from 324 pounds in May.  BP  98/58, pulse 93, oxygen saturation 92% on 2L.  He is quite obese, breath  sounds are shallow.  Basilar crackles or rales but no accessory muscle  use.  HEART SOUNDS:  Regular without murmur.  There is 1+ chronic lower  extremity edema.   IMPRESSION:  Fragile balance but relatively good control now for him.   PLAN:  Schedule return in 6 months, earlier p.r.n.     Clinton D. Maple Hudson, MD, Tonny Bollman, FACP  Electronically Signed    CDY/MedQ  DD: 05/01/2007  DT: 05/02/2007  Job #: [id number]   cc:   [name], M.D.  Nanetta Batty, M.D.

## 2010-12-27 NOTE — Cardiovascular Report (Signed)
NAMEMarland Kitchen  SHABAZZ, MCKEY NO.:  [id number]   MEDICAL RECORD NO.:  [id number]          PATIENT TYPE:  INP   LOCATION:  2922                         FACILITY:  [facility]   PHYSICIAN:  Nanetta Batty, M.D.   DATE OF BIRTH:  [birth year]   DATE OF PROCEDURE:  03/25/2009  DATE OF DISCHARGE:                            CARDIAC CATHETERIZATION   CARDIAC CATHETERIZATION/PCI AND STENT REPORT   [name] [name] is a 60 year old severely overweight married African American male  with history of CAD, status post coronary artery bypass grafting in  2001.  He did have intervention on his OM branch by myself in December  2005 with the Cypher drug-eluting stent.  He has had right SFA occlusion  and left SFA intervention by myself as well September 2009.  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  He had a Myoview performed last year,  which was negative for ischemia.  He was admitted on August 11 with  progressive crescendo angina.  Rule out for myocardial infarction.  He  was placed on IV heparin, nitro, and presents now for diagnostic  coronary arteriography to define his anatomy, rule out ischemic  etiology.   DESCRIPTION OF PROCEDURE:  The patient was brought to the second floor  [facility] in a postabsorptive state.  He was  premedicated with p.o. Valium.   His right groin was prepped and shaved in the usual sterile fashion.  Xylocaine 1% was used for local anesthesia.  A 6 upgraded to a 7-French  sheath was inserted into the right femoral artery using standard  Seldinger technique.  A 6-French right and left Judkins diagnostic  catheter as well as with 6-French pigtail catheter, and right bypass  graft catheter were used for selective coronary angiography, left  ventriculography, selective vein graft angiography, and selective  IMA  angiography.  Visipaque dye was used for the entirety of the case.  Retrograde aortic, left ventricular, and pullback pressures were  recorded.   HEMODYNAMICS:  1. Aortic systolic pressure 126, diastolic pressure 76.  2. Left ventricular systolic pressure 116, end-diastolic pressure 17.      There is no obvious pullback gradient noted.   SELECTIVE CORONARY ANGIOGRAPHY:  1. Left main normal.  2. LAD; LAD was totaled in its proximal portion after the first large      diagonal branch, which had competitive flow and severe segmental      proximal stenosis.  3. Left circumflex; left circumflex had a 75% near ostial/proximal      stenosis followed by 75% stenosis at the junction of the proximal      and mid third of the takeoff of a large OM branch.  This was      hypodense as well and past the OM.  The ostium of the OM, which was      stented, had what appeared to be a 75% hypodense stenosis.  4. Right coronary artery; this is a dominant vessel with a 60%      proximal stenosis  and a long diffusely and segmentally diseased      distal vessel.  The PDA was not visualized with native injection.  5. Vein graft to the PDA widely patent except for a 75% segmental      stenosis at the aorto-ostium and proximal segment, which had      progressed since his last cath in 2005.  6. LIMA to the LAD widely patent with many circuitous bends throughout      its mid course.  Distal anastomosis was intact.  The distal LAD was      approximately 2- to 2.25-mm vessel with a 90% stenosis that was      fairly focal at the junction of the mid and distal third, 80% near      apically.  7. Vein graft to the diagonal branch widely patent.  8. Left ventriculography; RAO left ventriculogram was performed using      25 mL of Visipaque dye at 12 mL per second.  The overall LVEF was      estimated at greater than 60% without focal wall motion      abnormalities.   IMPRESSION:  [name] [name] has progression  of disease in both the  proximal portion of the vein graft to the posterior descending artery as  well as in the proximal and mid circumflex.  Because of the patient's  baseline renal insufficiency, we will proceed with a staged  interventional approach beginning with the vein graft to the PDA and  ultimately the circumflex.   The patient was on aspirin and Plavix and was on heparin in the CCU.  The ACT was 306.  No additional heparin was given.  The patient was  given Integrilin double bolus infusion.  Visipaque dye was used for the  entirety of the case.  Retrograde aortic pressures were monitored during  the case.  Additional 300 mg of p.o. Plavix was given in addition to IV  Pepcid.   Using a 7-French RCB guide along with a filter wire, distal protection  was achieved.  Primary stenting was performed with a 3.0 x 23 XIENCE V  drug-eluting stent and deployed at 16 atmospheres.  Postdilatation was  performed with a 3.5 x 20 [place] Voyager at 16 atmospheres (3.6 mm resulting  reduction of 75% segmental ostial/proximal PDA-SVG stenosis to 0%  residual with excellent flow).  The filter wire was then easily  retrieved, and the guide catheter was removed.  The sheath was sewn  securely in place.  Plans will be to remove the sheath once ACT falls  below 170.  Integrilin will continue for 18 hours.  The patient was  gently hydrated.  His renal function will be followed carefully.  Plans  will be for stage proximal and mid native circumflex intervention early  next week.  The patient left the lab in stable condition.      Nanetta Batty, M.D.  Electronically Signed     JB/MEDQ  D:  03/25/2009  T:  03/25/2009  Job:  [id number]   cc:   Second Floor [facility]  [facility]  [name], M.D.

## 2010-12-27 NOTE — Discharge Summary (Signed)
[name] [name], [name] [name]            ACCOUNT NO.:  [id number]   MEDICAL RECORD NO.:  [id number]          PATIENT TYPE:  INP   LOCATION:  2502                         FACILITY:  [facility]   PHYSICIAN:  Nanetta Batty, M.D.   DATE OF BIRTH:  [birth year]   DATE OF ADMISSION:  03/23/2009  DATE OF DISCHARGE:  03/30/2009                               DISCHARGE SUMMARY   DISCHARGE DIAGNOSES:  1. Unstable angina associated with shortness of breath and fatigue.  2. Coronary artery disease with history of bypass grafts, now with      patent grafts except saphenous vein graft to the right coronary      artery, undergoing percutaneous transluminal coronary angioplasty      and stent with a drug-eluting Xience stent and staged during this      admission, percutaneous coronary intervention to the circumflex      artery and stent with a Xience drug-eluting stent.  3. Ongoing bleeding, post initial procedure with stopping of      Integrilin 3-4 hours earlier than originally planned.  4. Blood loss anemia.  We will monitor as an outpatient.  5. Renal sufficiency, currently stable.  6. Chronic obstructive pulmonary disease, on home oxygen.  7. Pulmonary fibrosis.  8. Diastolic dysfunction.  9. Obstructive sleep apnea, on BiPAP.  10.Normal ejection fraction.  11.Diabetes mellitus type 2, stable.  12.Hypotension due to dehydration with holding diuretics and treating      with saline bolus.  Resolved at discharge.  13.Episodic thrombocytopenia.   DISCHARGE CONDITION:  Improved.   PROCEDURES:  Combined left heart cath and graft visualization by Dr.  Nanetta Batty on March 25, 2009.   On March 25, 2009, PTCA with stent deployment with drug-eluting Xience  stent to the saphenous vein graft to the RCA.   On March 29, 2009, PTCA stent deployment and cutting balloon to the  ostium of the left circumflex with placement of Xience drug-eluting  stent to circ just distal to the OM.   DISCHARGE  MEDICATIONS:  1. Plavix 75 mg 1 daily, do not stop.  2. Crestor 20 mg 1 daily.  3. Furosemide 80 mg daily.  4. K-Dur 20 mEq daily.  5. Janumet 50/500, he only takes 1 a day but he was instructed to hold      that until April 01, 2009.  He was asked to continued to Januvia      while off Janumet for March 31, 2009 only, and then stop and      resume the Janumet.  6. Protonix 40 mg daily.  7. Imdur 30 mg daily which is half of his 60 mg tablet, this is a new      dose.  8. Enteric-coated aspirin 325 mg 1 daily.  9. Hold lisinopril until you see Dr. Allyson Sabal.  10.Multivitamin daily.  11.Nu-Iron 150 mg twice a day.  12.Nitroglycerin 1/150 one under your tongue as needed for chest pain,      1 every 5 minutes up to 3 over 15 minutes.  13.Stop Zaroxolyn.  14.Have blood work done on Thursday, April 01, 2009.   Please note  the Lasix 80 mg once a day is a new dose decreased from  twice a day and K-Dur was decreased to 1 tablet daily from 2 secondary  to his renal insufficiency and dehydration.  May need to resume higher  dose when seen as an outpatient.   DISCHARGE INSTRUCTIONS:  1. Low-sodium, heart-healthy, diabetic diet.  2. Wash cath site with soap and water.  Call if any bleeding,      swelling, or drainage.  3. Increase activity slowly.  No lifting for 1 week.  May shower.   HOSPITAL COURSE:  A 60 year old African American male with history of  coronary artery disease status post CABG and stents, peripheral vascular  disease, diabetes, hypertension, pulmonary fibrosis, and obstructive  sleep apnea presented to the ER on March 24, 2009, with chest pain and  severe shortness of breath.  Symptoms began 1 week prior to admission  with decreased exercise tolerance, decreased shortness of breath and  fatigue.  Also complained of left anterior chest discomfort which  occurred with exertion as well as at rest.  He had used sublingual nitro  for pain with relief.  However, his discomfort  returned shortly after  using the nitro.  Over the 36 hours prior to admission, symptoms  worsened with frequent chest pain and worsening dyspnea and fatigue  despite minimal activity.  He has had actually set up to breathe prior  to admission.  He has gained at least 2 pounds in 1 day.  When he was  seen in the emergency room, nitroglycerin paste was in place and he  received albuterol nebulizer treatments with some improvement.  EKG with  sinus rhythm with right bundle-branch block.   The patient was admitted to the CCU and monitored.  The next morning, he  was taken to the cath lab and underwent cardiac catheterization which  revealed stenosis of the vein graft to the RCA, undergoing PTCA and  stent deployment with Xience stent.  LV was normal.  He also had disease  in the circumflex and plans were for staged intervention.   Postprocedure, the patient continued to ooze at the cath site.  He was  injected with lidocaine and epinephrine at the site itself.  It was of  more venous ooze.  Despite this later in the evening, he began oozing  again, though no significant drop in H and H.  His Integrilin was  discontinued orally secondary to the continual oozing.  The patient  continued to do well.  Blood pressure was stable though lower than  usual.  His medications were adjusted because of this.  His lisinopril  was held and had been held since admission secondary to renal  insufficiency in the face of needing cardiac catheterization.   By August 15, his creatinine was more elevated at 1.69.  His Lasix was  discontinued, and he was monitored closely.  IV fluids were started.  By  August 16, he was stable and ready for cardiac catheterization with  stent deployment which he received by Dr. Clarene Duke and a drug-eluting  stent to the circumflex at the OM.  By March 30, 2009, the patient was  stable.  As he was anemic, we added iron to his medical regimen.  We  will follow his hemoglobin and  hematocrit as an outpatient with lab work  on Thursday of this week as well as following his creatinine.  Because  of the elevated creatinine in the past, we held his lisinopril until he  sees Dr. Allyson Sabal back.  His pressure here in the hospital has been towards  the lower end, especially over the last 3-4 days.  We have discontinued  the Zaroxolyn.  We added a PPI.  We added iron tablets and he will  continue his home oxygen in his CPAP at home as well.   Please note, Critical Care Medicine did see the patient and felt he was  stable to undergo cardiac catheterization from a lung standpoint and he  received dietary instructions.   LABORATORY DATA:  At discharge, sodium 141, potassium 4.1, chloride 100,  CO2 of 36, glucose 112, BUN 21, creatinine 1.14, and calcium 8.4.  Hemoglobin 8.6, hematocrit 26.8, WBC 4.9, and platelets were 20.  CK-MBs  have all been negative during this hospitalization and post procedures  were negative.  After second post procedure, CK 49, MB 0.9, and troponin  of 0.02.   Glycohemoglobin was 6.1.  Anemia panel does show iron at 21, TIBC at  385, iron percent saturation 7, vitamin B12 was 1375, and folate greater  than 20.   TSH 0.331, we will follow as an outpatient.   Lipid panel; total cholesterol 123, triglycerides 36, HDL 36, and LDL  80.  UA was clear.  BNP on admission was less than 30.  On August 12,  BNP was 58 and it remained low during hospitalization.   RADIOLOGY:  Chest x-ray; stable chronic change, possible superimposed  pulmonary vascular congestion.   CT angio of the chest; no central pulmonary embolism is identified.  No  focal acute cardiopulmonary process.   EKG; sinus rhythm without acute changes.   The patient was seen and discharged on August 17, by Dr. Allyson Sabal and will  follow up as previously instructed.      Darcella Gasman. Annie Paras, N.P.      Nanetta Batty, M.D.  Electronically Signed    LRI/MEDQ  D:  03/30/2009  T:  03/31/2009   Job:  [id number]   cc:   [name], M.D.  Clinton D. Maple Hudson, MD, FCCP, FACP

## 2010-12-27 NOTE — Discharge Summary (Signed)
NAMEMarland [name]  [name], [name] NO.:  [id number]   MEDICAL RECORD NO.:  [id number]          PATIENT TYPE:  OBV   LOCATION:  4729                         FACILITY:  [facility]   PHYSICIAN:  Nanetta Batty, M.D.   DATE OF BIRTH:  1951/05/04   DATE OF ADMISSION:  04/16/2008  DATE OF DISCHARGE:  04/17/2008                               DISCHARGE SUMMARY   DISCHARGE DIAGNOSES:  1. Claudication of lower extremities.  2. Abnormal ankle-brachial indexes  3. Peripheral vascular disease with stenosis of the left superficial      femoral artery of 60% and lower disease as well, undergoing      percutaneous transluminal angioplasty and stent to the left      superficial femoral artery.  4. Chronic obstructive pulmonary disease.  5. Pulmonary fibrosis.  6. Obstructive sleep apnea, on BiPAP at home as well as in the      hospital.  7. O2 dependent with oxygen at home.  8. Diabetes mellitus type 2.  9. Morbid obesity.  10.Anemia.  11.Abdominal pain or discomfort.  12.Coronary artery disease with history of bypass grafting and history      of stent as well.   DISCHARGE CONDITION:  Stable.   PROCEDURES:  PTA and stent deployment to the left SFA after peripheral  arteriogram by Dr. Allyson Sabal.   DISCHARGE MEDICATIONS:  1. Metolazone 2.5 mg 3 times a week, but do not taking before Monday,      April 20, 2008.  2. Janumet 50/500 daily, but do not taking any until Sunday, April 19, 2008.  He took Januvia before discharge from the hospital.  3. Plavix 75 mg daily.  4. Furosemide 80 mg twice a day.  5. Klor-Con 20 mEq 2 tablets daily.  6. Isosorbide 60 mg daily.  7. Lisinopril 5 mg daily.  8. Crestor 20 mg daily.  9. Aspirin 81 mg, has been changed to 325 mg daily.  10.Centrum Silver daily.  11.Combivent inhaler as needed.  12.Advair inhaler as needed.  13.Continue oxygen at home.  14.Continue BiPAP at home.  15.Pepcid 20 mg 1 twice a day.   DISCHARGE INSTRUCTIONS:  1.  Low-sodium, heart-healthy, diabetic diet.  2. Increase activity slowly.  May shower.  No lifting for 3 days.  No      driving for 3 days.  3. Wash cath site with soap and water.  Call if any bleeding,      swelling, drainage, or pain.  4. Followup with Dr. Allyson Sabal, May 01, 2008, at 11:45 a.m.  5. You are scheduled for lower extremity arterial Dopplers at Dr.      Hazle Coca office on the second floor, April 30, 2008, at 1:00      p.m.  6. You need lab work done Tuesday, April 21, 2008.  Check CBC and      basic metabolic panel.  7. Prescription for Pepcid was given and for the lab work.   HISTORY OF PRESENT ILLNESS:  A 60 year old severely obese, African  American male with history of coronary artery disease with bypass  grafting x6 in 2001  and since then intervention to the OM branch in  December 2005,  Other history includes obesity, type 2 diabetes,  hypertension, hyperlipidemia, COPD with pulmonary fibrosis, and  obstructive sleep apnea with the use of BiPAP and home oxygen.   He was having claudication symptoms.  His ABIs were abnormal and the  patient presented for angiography and underwent PTA and stent to the  left SFA.   On his pre-procedure labs, his hemoglobin was low.  We checked that  follow up during the hospitalization and remained stable.  We will send  him to a gastroenterologist if it stays decreased and we will check it  as an outpatient.   PHYSICAL EXAMINATION:  VITAL SIGNS:  At discharge, blood pressure  156/93, pulse 75, respiratory rate 20, temperature 97.1, and oxygen  saturation on room air 98%.  GENERAL:  Alert and oriented African American male.  LUNGS:  Clear.  HEART:  S1 and S2.  ABDOMEN:  Groin site is stable.  The patient ambulated without problems.   LABORATORY VALUES:  The CBC prior to procedure was 9.4 with a hematocrit  of 28.9, WBC 5.7, platelets 195, and MCV 84.2.  On followup, it was 9.6,  hematocrit 29.5, WBC 4.6, and platelets  181.   Chemistry:  Sodium on admission 141, potassium 3.8, chloride 94, CO2 of  39, BUN 34, creatinine 1.63, and glucose 127.  Followup sodium 141,  potassium 3.7, chloride 95, CO2 of 40, BUN 26, creatinine 1.36, and  glucose 168.  Coags, pre-procedure; PT 13.2, INR of 1, and PTT 29.   LFTs were within normal limits.  Calcium was 9.2.   Chest x-ray done on April 16, 2008, mild-to-moderate heart failure  and small pleural effusions.   The patient had no obvious signs of heart failure on this visit.   PROCEDURE:  As stated.   HOSPITAL COURSE:  The patient was admitted for PV angiogram and  underwent PTA and then stent to the left SFA.  He tolerated the  procedure well.  The next day, he was ambulating without problems and  without complaints.  Creatinine was stable.  We will need to check that as an outpatient,  also recheck hemoglobin.  I did put on Pepcid to cover some mild  abdominal discomfort he was having.  If hemoglobin remains low on  Tuesday of next week, we will need to send him to GI physician for  further evaluation.      Darcella Gasman. Annie Paras, N.P.      Nanetta Batty, M.D.  Electronically Signed    LRI/MEDQ  D:  04/17/2008  T:  04/18/2008  Job:  [id number]   cc:   Nanetta Batty, M.D.  [name], M.D.

## 2010-12-27 NOTE — Cardiovascular Report (Signed)
NAME:  GRAHM, ETSITTY NO.:  [id number]   MEDICAL RECORD NO.:  [id number]          PATIENT TYPE:  OBV   LOCATION:  4729                         FACILITY:  [facility]   PHYSICIAN:  Nanetta Batty, M.D.   DATE OF BIRTH:  [birth year]   DATE OF PROCEDURE:  DATE OF DISCHARGE:                            CARDIAC CATHETERIZATION   [name] [name] is a 60 year old morbidly overweight married African American male  with history of CAD status post bypass grafting x6 in 2001.  He had PCI  and stenting of his OM branch in December 2005 by myself.  His other  problems include adult-onset diabetes, hypertension, hyperlipidemia, and  COPD with pulmonary fibrosis.  He has sleep apnea and sleeps on BiPAP.  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  He has had negative Myoview in January 2009 after complaints of  chest pain.  His major issues are claudication left greater than right.  He had ABIs performed in our office on March 20, 2008, revealing a left  ABI of 0.9 and the right of 0.7 with high frequency signals in both  SFAs.  He presents now for angiography and potential intervention.   PROCEDURE DESCRIPTION:  The patient was brought to the Second Floor  [facility] in the postabsorptive state.  He was  premedicated with p.o. Valium, IV Valium, and IV fentanyl.  His right  groin was prepped and shaved in the usual sterile fashion.  Xylocaine 1%  was used for local anesthesia.  A 5-French sheath was inserted into the  right femoral artery using standard Seldinger technique.  A 5-French  pigtail catheter was used for abdominal midstream and distal abdominal  aortography.  Runoff performed in the left leg was done with a 4-French  end-hole catheter.  Runoff in the right leg was performed through the  side-arm sheath.  Visipaque dye was used for the entirety of the case.  Retrograde aortic pressure was monitored during the case.   ANGIOGRAPHIC RESULTS:  1. Abdominal aorta;      a.     Renal arteries - normal.      b.     Infrarenal abdominal aorta - normal.  2. Left lower extremity;      a.     60% segmental mid left SFA stenosis with a 20-mm pullback       gradient.      b.     Two-vessel runoff with an occluded anterior tibial.  3. Right lower extremity;      a.     Short segment occlusion at the junction of the proximal and       mid right SFA.      b.     Two-vessel runoff with a totally occluded anterior tib and       80% tibioperoneal trunk.   PROCEDURE DESCRIPTION:  Contralateral access was obtained with a  Glidewire followed by a Rosen wire via a 5-French end-hole and  ultimately a 7-French Terumo crossover sheath.  The lesion was crossed  with a __________ core wire and stenting was  performed with a 7 x 6  Nitinol self-expanding stent.  This was postdilated with a 6 x 6 Fox  angioplasty balloon at 4 atmospheres resulting reduction of a 60% mid  segmental left SFA stenosis to 0% residual.   IMPRESSION:  Successful percutaneous transluminal angioplasty and  stenting of [name] [name] left superficial femoral artery for  functionally limiting claudication with residual right superficial  femoral artery disease.  The sheath was withdrawn across the iliac  bifurcation.  ACT was measured and the sheath was removed.  Pressures on  the groin to achieve hemostasis.  The patient left the lab in stable  condition.  He will be gently hydrated, treated with  aspirin and Plavix, discharged home in the morning if he remains  clinically stable overnight.  Also follow up with Dopplers and ABIs, he  has to consult with me back in the office.  If he has a clinical  improvement in his left lower extremity, we will plan stage I SFA  intervention.  He left lab in stable condition.      Nanetta Batty, M.D.  Electronically Signed     JB/MEDQ  D:  04/16/2008  T:  04/17/2008  Job:  [id number]   cc:   [facility]  Cephas Darby. Daphine Deutscher, M.D.  [facility] Second Floor [place]

## 2010-12-27 NOTE — Discharge Summary (Signed)
NAMEMarland Kitchen  [name] [name], [name] [name] NO.:  [id number]   MEDICAL RECORD NO.:  [id number]          PATIENT TYPE:  INP   LOCATION:  2923                         FACILITY:  [facility]   PHYSICIAN:  Nanetta Batty, M.D.   DATE OF BIRTH:  [birth year]   DATE OF ADMISSION:  02/26/2008  DATE OF DISCHARGE:  02/27/2008                               DISCHARGE SUMMARY   DISCHARGE DIAGNOSES:  1. Shortness of breath, improved.  2. Chronic angina.  3. Exertional fatigue.  4. Chronic lower extremity edema.  5. Coronary artery disease, history of bypass grafting in 2001,      history of stents in December 2005, with negative Myoview in      January 2009.  6. Hypertension.  7. Dyslipidemia.  8. Diabetes mellitus type 2.  9. Obesity.  10.Chronic obstructive pulmonary disease with pulmonary fibrosis,      chronic oxygen at home.  11.Obstructive sleep apnea.  12.Pulmonary nodule on CT scan.   DISCHARGE CONDITION:  Improved.   DISCHARGE MEDICATIONS:  Currently cannot find med rec sheet in the  patient's chart.  It was reviewed by the nurse with the patient and a  copy given to the patient at discharge.  Medications were not changed  during hospitalization.   DISCHARGE MEDICINES:  1. Home oxygen as before.  2. BiPAP at home.  3. Furosemide 80 mg twice a day.  4. Plavix 75 daily.  5. Albuterol inhalers as needed.  6. Janumet 50/500 twice a day.  7. Metolazone 2.5 one Tuesday, Thursday, and Saturday 30 minutes prior      to Lasix.  8. Aspirin 81 mg daily.  9. Centrum Silver daily.  10.Imdur 60 mg daily.  11.Klor-Con 20 mEq two daily.  12.Lisinopril 5 mg daily.  13.Crestor 20 mg at bedtime.   Low-sodium heart-healthy diabetic diet.  No restrictions on activity.  Follow up with Dr. Allyson Sabal on March 04, 2008, at noon.  Follow up with Dr.  Jetty Duhamel in August, call for an appointment.   HISTORY OF PRESENT ILLNESS:  A 56-year African American male with  history of multiple medical problems  including coronary artery disease  with bypass x6 in 2001, and PCI and stent of the OM in December 2005,  diabetes type 2, hypertension, elevated cholesterol, COPD, pulmonary  fibrosis, obstructive sleep apnea on BiPAP on home oxygen, and obesity.  Call the office today with complaints of fatigue on exertion for several  days, feels like when he had heart failure before and intermittent chest  pain that occurred mostly with exertion and also at rest, but that had  been chronic for months and years.  There was somewhat worse chronic  lower extremity edema.  He actually had a 9-pound weight loss.  He had  some abdominal distention and diaphoresis.  He was admitted to rule out  MI.   ALLERGIES:  LOTREL, intolerant to PERCOCET.   FAMILY HISTORY, SOCIAL HISTORY AND REVIEW OF SYSTEMS:  See H&P.   PHYSICAL EXAMINATION AT DISCHARGE:  HEART:  Regular rate and rhythm with  a 2/6 systolic ejection murmur at the right sternal border.  LUNGS:  Decreased breath sounds bilaterally.  EXTREMITIES:  No edema.  VITAL SIGNS:  Blood pressure 110/60, pulse 83, respiratory rate 18,  temperature 97.7, and on 3 liter, he has 98% sat.   The patient had no events on the telemetry.  He was seen by Dr. Mariah Milling  and once he was ambulated, he felt he could be discharged home.  He did  not have any volume overloaded at all and the CK enzymes were all  negative for an MI.  He did have a pulmonary nodule on the CT scan of  his chest and he would need to follow up with Dr. Jetty Duhamel to  follow that.   LABORATORY DATA:  Hemoglobin 9.4, hematocrit 29.7, WBC 6.5, and  platelets 178.  D-dimer was elevated at 1.01.  CT of the chest was done,  which showed no PE, but a nodule.  Sodium 139, potassium 4.3, chloride  92, CO2 40, glucose was 44, BUN 33, creatinine 1.28, total protein 7.3,  albumin 3.5, AST 12, ALT 8, alkaline phos 63, total bili 0.7.  Cardiac  enzymes were all negative 42 and 35, MBs 0.3 and 0.4, and  troponin I  less than 0.01 x2.  BNP less than 30, total cholesterol 121,  triglycerides 97, HDL 25, and LDL 77.  UA was essentially clear.   The patient was seen and discharged home with followup as an outpatient.  Prior to discharge, his blood pressure was running 114/58.  He was  instructed only to take 40 mg on the evening of February 27, 2008, but  resume his regular 80 twice a day on the next day.      Darcella Gasman. Annie Paras, N.P.      Nanetta Batty, M.D.  Electronically Signed    LRI/MEDQ  D:  04/07/2008  T:  04/08/2008  Job:  [id number]   cc:   [name], M.D.  Clinton D. Maple Hudson, MD, FCCP, FACP

## 2010-12-28 NOTE — Consult Note (Signed)
NAMEMarland Kitchen  [name], [name]            ACCOUNT NO.:  [id number]  MEDICAL RECORD NO.:  [id number]           PATIENT TYPE:  I  LOCATION:  3005                         FACILITY:  [facility]  PHYSICIAN:  Terrial Rhodes, M.D.DATE OF BIRTH:  [birth year]  DATE OF CONSULTATION:  12/05/2010 DATE OF DISCHARGE:                                CONSULTATION   CONSULTING PHYSICIAN:  Marcellus Scott, MD  REASON FOR CONSULTATION:  Acute kidney injury, on chronic kidney disease.  HISTORY OF PRESENT ILLNESS:  [name] [name] is a 60 year old African American male with multiple medical problems most notable for coronary artery disease, COPD, obstructive sleep apnea, peripheral vascular disease, diabetes, hypertension and chronic kidney disease stage III with baseline creatinine of approximately 1.3 who was admitted on November 27, 2010, with epistaxis and acute blood loss anemia.  At that time he was noted to have an elevated serum creatinine at 3.22 and is felt to be related to acute blood loss anemia with concomitant ARB therapy.  His creatinine trended down to 2.7 at time of discharge on April 19. Unfortunately he was readmitted on the 22nd with stroke-like symptoms and his creatinine was back up to 3.84.  We were asked to further evaluate and manage his acute kidney injury on chronic kidney disease. The patient denies any previous history of kidney disease, but does have an extensive atherosclerotic history and his last creatinine in the medical records was in July 2011 was 1.24.  however he did receive a cardiac catheterization after that time.  I do not have any other labs available, so I am unsure if his creatinine has continued to climb over the last 6 months following his heart catheterization.  His urinalyses over the last 5 years have always been without proteinuria or hematuria. His trend in serum creatinine is as follows; On Dec 19, 2009, Creatinine was 1.31. July 2011 Creatinine was  1.24. November 27, 2010, Creatinine was3.22. April 16 Creatinine was 2.86. April 17 Creatinine was 3.03. April 19 Creatinine was 2.76. April 22 Creatinine was 3.84. April 23 Creatinine was 3.59.  Of note, the patient was taken off losartan and metformin at the last hospitalization, although he was still taking the metformin at home.  He admits to taking 4 Advil this week since discharge but no dysuria, pyuria, hematuria, urgency, frequency or retention.  ALLERGIES:  To ACE INHIBITORS and PERCOCET which causes hallucinations.  PAST MEDICAL HISTORY: 1. Coronary artery disease status post bypass graft surgery in 2000.     a.     Status post angioplasty and stent February 16, 2010. 2. Diabetes mellitus for 12 years. 3. Hypertension for 10 years. 4. Oxygen-dependent COPD. 5. Obstructive sleep apnea. 6. Obesity. 7. Chronic kidney disease stage III, baseline creatinine of 1.2 to 1.5     prior to July 2011. 8. Hypercholesterolemia, anemia of chronic disease. 9. Diastolic congestive heart failure.  PAST SURGICAL HISTORY: 1. CABG in 2000. 2. Status post appendectomy. 3. Lung biopsy at [facility].  OUTPATIENT MEDICATIONS: 1. Xanax 0.5 mg t.i.d. p.r.n. 2. Metoprolol 25 mg b.i.d. 3. Crestor 20 mg daily. 4. Iron daily. 5. Lasix 80 mg b.i.d. 6. Multivitamin daily.  7. Imdur 60 mg daily. 8. Aspirin 325 mg daily. 9. Plavix 75 mg daily. 10.Protonix 40 mg daily. 11.Potassium chloride 20 mEq twice daily. 12.Claritin 10 mg daily. 13..  ProAir inhaler 2 puffs p.r.n. 1. Metolazone 7.5 mg every Monday, Wednesday and Friday. 2. He is instructed to discontinue losartan and metformin at the time     of discharge in April.  FAMILY HISTORY:  No family history of kidney disease.  Father died while he was at work likely from a heart attack.  Mother died in her 11s.  He has 2 sisters and 2 brothers in good health.  SOCIAL HISTORY:  He is married.  He quit smoking in 2000, rare alcohol. No IV drug use.   He lives with his wife.  He has been married for 18 years.  No children.  REVIEW OF SYSTEMS:  GENERAL:  He is complaining of weakness and fatigue on admission.  HEENT:  No tinnitus, dysphagia, odynophagia.  CARDIAC: No chest pain, palpitations, orthopnea, or PND.  PULMONARY:  Has chronic shortness of breath, no hemoptysis or productive cough.  GI:  No nausea, vomiting, hematochezia, melena or bright blood per rectum.  GU:  No dysuria, pyuria, hematuria, urgency, frequency or retention. RHEUMATALGIC:  No arthralgias or myalgias.  DERMATOLOGIC:  No rashes, lumps or bumps.  HEMATOLOGIC:  No abnormal bleeding or bruising and all other systems negative.  NEUROLOGIC:  He has weakness and had some confusion which he does not remember.  PHYSICAL EXAMINATION:  GENERAL:  This is a well-developed obese male lying in bed in no apparent distress. VITAL SIGNS:  Temperature 98.4, pulse 99, blood pressure 127/71, respiratory rate 18.  His weight is 133.2 kg.  He is 6 feet 1 inches tall. HEENT:  Head normocephalic, atraumatic.  Extraocular movements intact. No icterus.  Oropharynx without lesions. NECK:  Supple.  He had transmitted bruits or murmur bilaterally. LUNGS:  He had scattered rhonchi.  No crackles. CARDIAC:  Regular rate and rhythm with a 2/6 systolic ejection murmur radiating up to the carotids. ABDOMEN:  Obese, normoactive bowel sounds, soft.  He had some mild suprapubic firmness.  No guarding or rebound.  EXTREMITIES:  He has trace pretibial edema bilaterally.  1+ pedal pulses bilaterally.  LABORATORY FINDINGS:  Sodium 142, potassium 4.9, chloride 96, CO2 39, BUN 61, creatinine 3.59, glucose 130, calcium 8.6, phosphorous 4.5, albumin 3.1.  Urinalysis was negative for blood and protein.  White blood cell count 7.5, hemoglobin 8, platelets 137.  ASSESSMENT AND PLAN: 1. Acute kidney injury on chronic kidney disease.  This is     nonoliguric.  No hematuria, proteinuria.  Admits to some      nonsteroidals but has stopped his losartan since his last     admission.  No other nephrotoxic agents noted and question whether     or not this is acute versus subacute as my last reading prior to     this hospitalization was in July 2011 at 1.3.  Try to obtain     outpatient records from Dr. Hazle Coca office as well as Dr. Alwyn Pea in the meantime.  His CPK level was low, so rhabdo is not     likely.  Given his bland urine sediment this may be hemodynamically     mediated from his right-sided heart failure but we will check an     ultrasound to rule out obstruction, also check an SPEP and UPEP and     follow. 2.  Stroke-like symptoms with right lower extremity weakness.  Workup     is underway.  He had transcranial Dopplers.  Consider an MRI of his     brain. 3. Hypertension, stable. 4. Diabetes mellitus per primary service. 5. Coronary artery disease, stable. 6. Peripheral vascular disease, stable.  He did have a renal angiogram     done in 2009 that was negative for stenosis or lesions. 7. Anemia, questionable this is chronic disease versus acute blood     loss anemia.  We will rule out myeloma, guaiac stools and check     iron stores. 8. Obstructive sleep apnea.  Continue with CPAP.  We will continue to     follow.    ______________________________ Terrial Rhodes, M.D.     JC/MEDQ  D:  12/05/2010  T:  12/06/2010  Job:  [id number]  Electronically Signed by Terrial Rhodes M.D. on 12/28/2010 08:08:02 AM

## 2010-12-29 NOTE — Consult Note (Signed)
[name] [name], [name] [name]            ACCOUNT NO.:  [id number]  MEDICAL RECORD NO.:  [id number]           PATIENT TYPE:  I  LOCATION:  3005                         FACILITY:  [facility]  PHYSICIAN:  Excell Seltzer. Annabell Howells, M.D.    DATE OF BIRTH:  11-07-1950  DATE OF CONSULTATION:  12/05/2010 DATE OF DISCHARGE:                                CONSULTATION   Patient of [facility] hospitalist.  CHIEF COMPLAINT:  Left hydronephrosis.  HISTORY:  [name] [name] is a 60 year old black male who has been seen by Dr. Aldean Ast in 2010, for erectile dysfunction.  He is admitted now with recurrent nose bleeds and progressive renal insufficiency with a creatinine that was 3.83, but is now temperature 3.59.  As part of his workup for the renal insufficiency, a renal ultrasound was done earlier today which revealed moderate left hydronephrosis.  The patient denies hematuria, prior GU surgery or stones.  He has had some left flank pain, but it is quite mild.  At my request, a CT urogram was done this evening.  This shows mild-to-moderate dilation of the left kidney and proximal ureter to the level of the iliacs, where there appears to be a ovoid mass of some sort either in or around the ureter.  PAST MEDICAL HISTORY:  Significant for allergies to ACE INHIBITORS and PERCOCET.  CURRENT MEDICATIONS:  Albuterol, isosorbide, pantoprazole, Plavix, insulin, aspirin, hydromorphone, alprazolam, ondansetron, Lovenox, Claritin, Senokot, docusate, MiraLax and Neurontin.  MEDICAL HISTORY:  Significant for the recent epistaxis requiring transfusion, oxygen dependent COPD, obstructive sleep apnea, chronic kidney disease and coronary artery disease with history of congestive heart failure, anemia of chronic disease and diabetes.  SURGICAL HISTORY:  Pertinent for coronary artery bypass grafting, coronary artery stent placement, appendectomy and a lung surgery at [facility] in the past.  SOCIAL HISTORY:  He is a former smoker about a pack a  day prior to 2000. He drinks rare alcohol.  FAMILY HISTORY:  Mother and father both had some sort of heart disease.  REVIEW OF SYSTEMS:  He denies headaches or dizziness.  He has had the recent nose bleed, but none current.  He denies sore throat.  He has had chronic shortness of breath.  He denies chest pain, but does have some mild edema.  He denies diarrhea, constipation or bloody stools.  He has been voiding without complaints.  He has had some recent numbness in his left hand, but he reports that is better today.  He denies any skin complaints.  He has no neurologic complaints.  He denies depression or anxiety.  He is otherwise entirely without complaints on a full review of systems except as above.  PHYSICAL EXAMINATION:  VITAL SIGNS:  His temperature is 98.1, pulse 92, respirations 19 and blood pressure 132/68. GENERAL:  He is a well-developed, well-nourished black male in acute distress, alert and oriented x3. HEENT:  Head and face normocephalic, atraumatic.  He is receiving oxygen through nasal cannula. NECK:  Supple without masses. LUNGS:  Bilateral breath sounds with crackles. HEART:  Regular rate and rhythm. ABDOMEN:  Soft, obese with mild left CVA tenderness.  No mass or hepatosplenomegaly are noted.  He does have an umbilical hernia reducible. GU:  Unremarkable phallus with an adequate meatus.  Scrotum is unremarkable.  Testicles are descended without mass.  Epididymides are unremarkable. RECTAL:  Not done. EXTREMITIES:  Have full range of motion with mild ankle edema. SKIN:  Warm and dry. NEUROLOGICAL:  He has no focal deficits.  I reviewed his hospital records and also his prior office notes from Dr. Valda Lamb visit.  I have reviewed the renal ultrasound and CT scan films and reports from today with results noted above.  He does also have gallstones.  His labs were reviewed.  His urinalysis is negative.  CBC; he had a white count of 7.5, hemoglobin of 8 and  platelet count of 137,000.  His potassium is 4.9, BUN is 61, creatinine is 3.59.  PTT is 21.  PT is 12.9 with an INR of 0.95.  IMPRESSION: 1. Acute renal failure with left hydronephrosis with a possible mass     at the level of the iliacs that could be extrinsic to or intrinsic     to the ureter. 2. History of multiple medical problems including chronic obstructive     pulmonary disease, congestive heart failure and diabetes.  RECOMMENDATIONS:  He will need to undergo cystoscopy with left retrograde pyelography and possible stent insertion when medically cleared and the urine cytology will be obtained.  I will go ahead and make him n.p.o. at night, work on making arrangements to schedule for tomorrow if possible, however, it may be later in the week.     Excell Seltzer. Annabell Howells, M.D.     JJW/MEDQ  D:  12/05/2010  T:  12/06/2010  Job:  [id number]  cc:   [name], M.D.  Electronically Signed by Bjorn Pippin M.D. on 12/29/2010 10:50:52 AM

## 2010-12-30 ENCOUNTER — Other Ambulatory Visit (HOSPITAL_COMMUNITY): Payer: Self-pay | Admitting: Nephrology

## 2010-12-30 DIAGNOSIS — R809 Proteinuria, unspecified: Secondary | ICD-10-CM

## 2010-12-30 NOTE — Assessment & Plan Note (Signed)
 HEALTHCARE                             PULMONARY OFFICE NOTE   NAME:[name] [name], [name]                   MRN:          [id number]  DATE:08/27/2006                            DOB:          1950/09/22    PROBLEMS:  1. Chronic obstructive pulmonary disease.  2. Obstructive sleep apnea.  3. Exogenous obesity.  4. Coronary disease/bypass graft/stent.  5. Diastolic dysfunction with dilated cardiomyopathy.  6. Abnormal chest x-ray with atelectasis, scarring, and parenchymal      density.   HISTORY:  He had done cardiac rehabilitation in the past.  We discussed  the possibility of doing pulmonary rehabilitation when he asked about  ways to keep his endurance up.  If he is sitting quietly at home alone,  he notices his hands will jerk a little and he sometimes drops things  and gets drowsy easily.  We discussed the need to just stay up and stay  more active so that he does not start drifting off to sleep.  Scant dry  cough.  BiPAP at inspiratory 15, expiratory 12.  Makes his mouth dry  despite humidifier.  Oxygen continues at 2 to 3 L per minute.  Sometimes  when he is standing or walking long periods he gets some pain in the  right posterolateral flank.  It sounds as if he is describing a pressure  discomfort in the chest wall.  Pulmonary function testing in March of  2006 had shown severe obstructive and restrictive deficits.  We  discussed the impact of his substantial abdominal obesity on his  pulmonary function and exercise tolerance.   MEDICATIONS:  1. Aspirin 81 mg.  2. Multivitamin.  3. Crestor 20 mg.  4. Glipizide ER 5 mg.  5. Furosemide 80 mg b.i.d.  6. Plavix 75 mg.  7. Sular 20 mg.  8. Potassium.  9. Oxygen at 2 to 3 L.  10.Advair 250/50.  11.Diovan 80 mg.  12.Isosorbide 30 mg.  13.Rescue albuterol inhaler or Combivent.   ALLERGIES:  NO MEDICATION ALLERGIES.   OBJECTIVE:  VITAL SIGNS:  Weight 339 pounds.  Blood pressure 122/70,  pulse regular and 92, oxygen saturation 87% on 2 L which is about where  it was last visit.  GENERAL:  He is quite obese but alert and not in any evident distress.  CHEST:  Quite with symmetrical breath sounds.  HEART:  Heart sounds are regular.  EXTREMITIES:  There are chronic stasis changes in the legs.   IMPRESSION:  Chronic respiratory failure which includes a component of  obesity with hypoventilation, cor pulmonale, congestive heart failure,  obstructive sleep apnea.   PLAN:  Continue BiPAP, inspiratory 15, expiratory 12.  Refer to  pulmonary rehabilitation if he will go.  Schedule return in four months.  Emphasis on weight loss and activity.     [name] D. Maple Hudson, MD, [name] [name] [name], FACP  Electronically Signed    CDY/MedQ  DD: 08/27/2006  DT: 08/28/2006  Job #: [id number]   cc:   [name] [name] [name], M.D.  [name] [name] [name], M.D.

## 2010-12-30 NOTE — H&P (Signed)
NAME:  [name] [name], JOINER NO.:  [id number]   MEDICAL RECORD NO.:  [id number]          PATIENT TYPE:  INP   LOCATION:  2916                         FACILITY:  [facility]   PHYSICIAN:  Nanetta Batty, M.D.   DATE OF BIRTH:  05-08-1951   DATE OF ADMISSION:  11/12/2005  DATE OF DISCHARGE:                                HISTORY & PHYSICAL   Mr. Wages is a 60 year old moderately overweight, married African-  American male patient of Dr. Allyson Sabal and Dr. Daphine Deutscher with a history of CAD,  status post bypass grafting in 2000 and one by Dr. Andrey Spearman.  He  had a LIMA to his LAD, sequential vein graft to the diagonal and ramus with  sequential vein graft to L1 and L2 as well as vein graft to the RCA.  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  He was admitted on December 24 with chest pain and shortness  of breath.  He was found to have an occluded vein to his circ marginal  branch and underwent PCI and stenting via a Cypher drug-eluting stent.  He  was recently seen in the office late last year and was stable.  Over the  last 2-3 weeks, he has noticed increasing shortness of breath, lower  extremity edema, and occasional chest pain.  He saw Dr. Daphine Deutscher in the  office, who apparently had a diuretic.  Because of progressive symptoms, he  presented to [facility] this evening and was seen by Dr. Estell Harpin.  He had saturations in the 80s on room air, and he was tachycardic.  He was  treated with IV Lasix and was painfree at the time of  this interview.   HOME MEDICATIONS:  1.  Klor-Con 40 mEq p.o. b.i.d.  2.  Aspirin 81.  3.  Plavix 75.  4.  Lasix 40 mg a day.  5.  Advair p.r.n.  6.  Crestor 20 mg a day.  7.  Sular 20 mg a day.  8.  Isosorbide 20 mg a day.  9.  Glipizide 5 mg a day.  10. Demadex 20 mg a day.  11. Combivent and Advair  p.r.n.   ALLERGIES:  None.   REVIEW OF SYSTEMS:  See above.   PHYSICAL EXAMINATION:  VITAL SIGNS:  Blood pressure 150/60, pulse 109.  His  saturations were 92% on 3 liters.  LUNGS:  Revealed rales bilaterally.  HEART:  Tachycardic.  ABDOMEN:  Protuberant.  EXTREMITIES:  He had 2-3+ pitting edema with intact pedal pulses.   A 12-lead EKG reveals sinus tachycardia at 105 with no ST/T wave changes.   Chest x-ray revealed cardiomegaly with bilateral pulmonary edema.   Laboratory exam revealed hemoglobin of 12.6, hematocrit 39%, white count  9.3, platelet count 231.  Sodium 139, potassium 4, chloride 97, CO2 36,  glucose 149, BUN 20, creatinine 1.  BNP 30.  Point-of-care markers were  negative.   IMPRESSION:  Mr. Cowie has progressive shortness of breath with volume  overload,  consistent with congestive heart failure along with chest pain.  I  think this may possibly be ischemically mediated.  He will be transferred to  [facility] and placed in the CCU.  He will be treated with Lasix 80  IV b.i.d. along with IV heparin, nitroglycerin, and Natrecor.  Once he is  diuresed and is euvolemic, he will need diagnostic coronary arteriography.      Nanetta Batty, M.D.  Electronically Signed     JB/MEDQ  D:  11/12/2005  T:  11/13/2005  Job:  [id number]   cc:   [name], M.D.  Fax: [phone]

## 2010-12-30 NOTE — Discharge Summary (Signed)
NAMEMarland Kitchen  [name], [name] NO.:  [id number]   MEDICAL RECORD NO.:  [id number]          PATIENT TYPE:  INP   LOCATION:  3738                         FACILITY:  [facility]   PHYSICIAN:  Nanetta Batty, M.D.   DATE OF BIRTH:  August 10, 1951   DATE OF ADMISSION:  11/12/2005  DATE OF DISCHARGE:  11/18/2005                                 DISCHARGE SUMMARY   DISCHARGE DIAGNOSES:  1.  Acute shortness of breath with bronchitis/pneumonia.  2.  Pulmonary fibrosis.  3.  Obstructive sleep apnea with C-PAP.  4.  Diastolic dysfunction but no heart failure this admission.  5.  History of coronary disease with bypass graft and normal left      ventricular.  6.  Pleural effusion, small.  7.  Hypercarbic respiratory failure.   CONDITION ON DISCHARGE:  Improved.   PROCEDURE:  None.   DISCHARGE MEDICATIONS:  1.  Prednisone 10 mg take 2 pills November 19, 2005 and 1 pill November 20, 2005 and      1 pill November 21, 2005 and then stop.  2.  Avelox 400 mg 1 daily for 5 more days.  3.  Aspirin 81 mg daily.  4.  Plavix 75 mg daily.  5.  Crestor 20 mg daily.  6.  Sular 20 mg daily.  7.  Glucotrol 5 mg daily.  8.  Nasal spray 2 spray four times a day.  9.  Nasonex 2 sprays daily.  10. Lasix 40 mg daily.  11. Imdur 30 mg daily.  12. Diovan 80 mg daily.  13. Advair inhaler four times a day.  14. Combivent inhaler four times a day.  15. K-Dur 20 mEq daily.   DISCHARGE INSTRUCTIONS:  1.  Low-fat, low-salt, diabetic diet.  2.  Increase activity slowly.  3.  Follow up with Dr. Allyson Sabal.  The office will call you with the time.  4.  Call Dr. Roxy Cedar office on Monday for an appointment in two weeks.  5.  Flutter valve three times a day for pulmonary toilet.  6.  Home oxygen as before.  7.  Home C-PAP.   HISTORY OF PRESENT ILLNESS:  A 60 year old African-American male with  history of bypass grafting in 2000, stent to the left circumflex August 05, 2004.  Other problems include diabetes,  hyperlipidemia, heart failure,  COPD with home oxygen, history of lung decortication at [facility].  He has  obstructive sleep apnea as well.  He was last seen by Dr. Allyson Sabal in the  office in 2006.  At that time, he was stable.  He presented to [facility] and then transfer to [facility] with increase shortness of  breath with chest pain for two to three weeks.  Saw Dr. Daphine Deutscher who added  diuretics without relief.  Went to the [facility] Emergency Room,  he was in heart failure on admit x-ray with IV diuretics.  Exam significant  for bilateral basilar rales, tachycardia, abdominal distended 2 to 3+  peripheral edema.  EKG without ST changes.  Chest x-ray bilateral pulmonary  edema.   Started on IV heparin, IV  Natrecor, nitroglycerin, rule out MI with transfer  to [facility].  By the next morning, it was felt he had an  infection component of his shortness of breath with temperature 99.2, green  sputum.  Blood pressure was stable.  It was time to discontinue the  Natrecor.  He was on IV heparin at the time.  Troponins only bumped up  slightly and the patient continue to somewhat improve.  Pulmonary consult  was obtained.  The patient also has a history of asthma.  They felt that  there was multifactorial dyspnea, suspected viral upper respiratory sinus  etiology.  Avelox was added to his medical regimen.  Nebulizers as well as  Advair.  Steroids were initially held but were restarted.   At times heart rate would be up to 115.  Steroids were added secondary to  his shortness of breath.  Flutter valve was added to his treatment as well  as the prednisone.   Chest x-ray showed some cardiomegaly.  It was difficult to assess.  Chest CT  was done November 17, 2005 which revealed new patchy bilateral airspace  infiltrate suggesting pneumonia or atypical edema, stable chronic and  postoperative changes as well.  His lungs have been stable since his  previous study.  He  did have four chamber cardiac enlargement, new patchy  peripheral airspace infiltrate in the posterior right upper lobe, lingula  and in both lower lobes.  The patient was able to ambulate in the room.  He  stayed on his home oxygen as well as his C-PAP.  Dr. Sherene Sires saw him on the 7th  and felt he was stable to be discharged and not want to change the  antibiotics.  Would remain on antibiotics, use Advair and Combivent  inhalers.   Chest x-ray, admission x-ray at [facility], moderate to severe  heart failure.  Follow up no change in heart failure, slight increase in  pulmonary vascular congestion and pulmonary edema.   LABORATORY DATA:  Admitting hemoglobin 12.4, hematocrit 38, WBC 9.4,  platelets 201,000.  At discharge, hemoglobin 12.3, hematocrit 38.3, WBC 7.1,  heparin 0.36.  Pro time 14.1, INR 1.1, PTT 31 on admission.  Chemistry:  Sodium on admission 141, potassium 3.9, chloride 96, CO2 36, glucose 217.  BUN 16, creatinine 0.9.  Prior to discharge, sodium 141, potassium 4.1,  chloride 91, CO2 42.  Glucose 112, BUN 15, creatinine 0.7.  Cardiac enzymes  range 43 to 63, MB 0.9, 5.1, troponin I 0.03 to 0.08.  BNP 43, 32, and less  than 30.  T3 uptake was 44.3, slightly elevated.  Free T4 was 1.39 and  repeat TSH was 0.711 which was within normal limits.  It had been 0.319.   PHYSICAL EXAMINATION:  VITAL SIGNS:  Prior to discharge, blood pressure had  been up 152/103 and was down to 107/67, pulse 83, respirations 20,  temperature 96.8.  Oxygen saturation 93% on three liters.  HEART:  Regular rate and rhythm.  EXTREMITIES:  Positive pulses.  He does continue with edema.  ABDOMEN:  Soft and nontender.  Positive bowel sounds.  GU:  Negative.   The patient was seen in discharge home by Dr. Alanda Amass on November 18, 2005.  Please note prior to going home he did get anxious, became diaphoretic. Sugar was not checked.  He could have been hypoglycemic.  He was given Xanax  and he  improved immensely.  He will go home with home oxygen.  Darcella Gasman. Annie Paras, N.P.      Nanetta Batty, M.D.  Electronically Signed    LRI/MEDQ  D:  11/18/2005  T:  11/18/2005  Job:  [id number]   cc:   Nanetta Batty, M.D.  Fax: [phone]   Clinton D. Maple Hudson, M.D.  [facility]  [address]  [place]  [place] [address]   Cephas Darby. Daphine Deutscher, M.D.  Fax: [phone]

## 2010-12-30 NOTE — Assessment & Plan Note (Signed)
HEALTHCARE                               PULMONARY OFFICE NOTE   NAME:[name] [name]                   MRN:          [id number]  DATE:04/25/2006                            DOB:          [birth year]    PROBLEM LIST:  1. Chronic obstructive pulmonary disease.  2. Obstructive sleep apnea.  3. Exogenous obesity.  4. Coronary disease/bypass graft/stent.  5. Diastolic dysfunction with dilated cardiomyopathy.  6. Abnormal chest x-ray with atelectasis, scarring and parenchymal      density.   HISTORY:  He feels about the same, some days better than others.  He reports  little phlegm.  He continues BiPAP with an inspiratory pressure of 15 and an  expiratory pressure of 12, supplementing oxygen at 2-3 liters but complains  that the BiPAP dries his mouth.  He does not know how to adjust his  humidifier and I have talked to him about that and asked him to contact his  home care company for further help.  He notices some vague aching soreness  bilaterally in the anterolateral ribs when walking relieved by rest but this  is inconsistent and also seems to come if he bends forward a lot.  He is  having less swelling in his feet.  He finds he sleeps best sitting up in a  chair in the day. The more he lies back, the less comfortable he is.  This  has not changed.   MEDICATIONS:  1. Aspirin 325 mg.  2. Multivitamins.  3. Crestor 20 mg.  4. Glipizide ER 5 mg.  5. Furosemide 40 mg b.i.d.  6. Plavix 75 mg.  7. Sular 20 mg.  8. Potassium.  9. BiPAP 15/12.  10.Oxygen at 2-3 liters.  11.Nasonex.  12.Diovan 80 mg.  13.Advair 250/50.  14.Combivent.   ALLERGIES:  NO KNOWN DRUG ALLERGIES.   OBJECTIVE:  VITAL SIGNS:  Weight 334 pounds up from 327 pounds recorded in  April.  There is birthmark along the left ear and left side of his jaw which  he says is not a keloid.  NECK:  There is no neck vein distention or stridor.  LUNGS:  Breath sounds are diminished  bilaterally with a few crackles.  Shallow inspiratory effort.  Unlabored.  EXTREMITIES:  He has 1 to 2+ chronic lower extremity edema.   IMPRESSION:  1. Chronic respiratory failure probably with a component of pulmonary      edema secondary to heart failure.  2. Core pulmonale.  3. Chronic obstructive pulmonary disease.  4. Sleep apnea.  5. Increase oxygen to 3 liters.  6. He can take an extra Lasix 40 mg with an extra potassium p.r.n. on days      he thinks he is retaining more fluid but he needs to follow up on      response to this with Dr. Allyson Sabal.  7. Schedule return in four months, earlier p.r.n.  Clinton D. Maple Hudson, MD, FCCP, FACP   CDY/MedQ  DD:  04/25/2006  DT:  04/26/2006  Job #:  [id number]   cc:   [name], M.D.  Nanetta Batty, M.D.

## 2010-12-30 NOTE — Op Note (Signed)
NAME:  [name] [name], [name] [name]                      ACCOUNT NO.:  [id number]   MEDICAL RECORD NO.:  [id number]                   PATIENT TYPE:  OIB   LOCATION:  2899                                 FACILITY:  [facility]   PHYSICIAN:  Quita Skye. Hart Rochester, M.D.               DATE OF BIRTH:  [birth year]   DATE OF PROCEDURE:  12/03/2002  DATE OF DISCHARGE:                                 OPERATIVE REPORT   PREOPERATIVE DIAGNOSIS:  Lower extremity claudication, rule out aortoiliac  occlusive disease.   PROCEDURE:  Abdominal aortogram with bilateral lower extremity runoff via  right common femoral approach with selective catheterization of left  external iliac artery.   SURGEON:  Quita Skye. Hart Rochester, M.D.   ANESTHESIA:  Local Xylocaine.   CONTRAST:  200 mL.   COMPLICATIONS:  None.   DESCRIPTION OF PROCEDURE:  The patient was taken to [facility] Peripheral  Endovascular [facility], placed in the supine position at which time both groins  were prepped with Betadine solution and draped in a routine sterile manner.  After infiltration with 1% Xylocaine, right common femoral artery was  entered percutaneously, guidewire passed into the suprarenal aorta under  fluoroscopic guidance.  A 5 French sheath and dilator were passed over the  guidewire.  Dilator removed and standard pigtail catheter positioned in the  suprarenal aorta.  Flush abdominal aortogram was performed injecting 20 mL  of contast at 20 mL per second.  This revealed the aorta to be normal in  appearance with single widely patent renal arteries bilaterally and a patent  inferior mesenteric artery.  Both common, internal and external iliac  arteries appeared widely patent.  The catheter was withdrawn into the  terminal aorta and bilateral iliac angiography performed injecting 20 mL of  contrast at 20 mL per second.  This revealed the entire iliac systems to be  normal without areas of severe stenosis.  Pigtail catheter was removed over  the  guidewire.  IMA catheter used to cannulate the left common iliac artery  and a guidewire passed into the left external iliac artery and the IMA  catheter exchanged for an end hole catheter.  The left lower extremity  angiogram was performed through the end hole catheter which revealed mild  diffuse left superficial femoral occlusive disease.  There was a moderate  stenosis at the origin of the profunda femoris approximating 70%.  There was  one area of stenosis in the mid superficial femoral artery approximating 75%  but otherwise there were no areas of severe stenosis in the SFA.  The  posterior tibial had a high origin beginning at the knee joint and there was  two vessel runoff of the posterior tibial and peroneal arteries.  Both these  vessels were patent to the ankle.  The end hole catheter was then removed  and right lower extremity angiogram performed through the sheath injecting  30 mL of contrast at 20 mL  per second.  This revealed the right superficial  femoral artery to be severely diseased diffusely with two areas of near  total occlusion in the mid thigh and a patent popliteal artery above the  knee. The popliteal artery was smaller than normal, traversing the knee  joint and below the knee.  The anterior tibial artery was occluded.  There  was a severe stenosis in the distal tibial peroneal trunk and two vessel  runoff to the posterior tibial and peroneal arteries.  Additional views were  obtained using the peek hole technique and following this, the sheath was  removed.  Adequate compression applied.  No complications ensued.   FINDINGS:  1. Normal aortoiliac system with single patent renal arteries bilaterally.  2. Mild left superficial femoral occlusive disease with focal 70% stenosis     in mid thigh and two vessel runoff through posterior tibial and peroneal     arteries.  3. Diffuse severe right superficial femoral occlusive disease with two areas     of severe  stenosis and two vessel runoff to the right via the posterior     tibial and peroneal arteries with severe stenosis of tibioperoneal trunk.                                               Quita Skye Hart Rochester, M.D.    JDL/MEDQ  D:  12/03/2002  T:  12/03/2002  Job:  [id number]

## 2010-12-30 NOTE — Assessment & Plan Note (Signed)
[facility]                             PULMONARY OFFICE NOTE   NAME:[name] [name], [name] [name]                   MRN:          [id number]  DATE:12/27/2006                            DOB:          1950/09/23    PROBLEM LIST:  1. Chronic obstructive pulmonary disease.  2. Chronic respiratory failure.  3. Obstructive sleep apnea.  4. Obesity hypoventilation.  5. Cor pulmonale.  6. Coronary disease/bypass graft/stent.  7. Diastolic dysfunction with dilated cardiomyopathy.   HISTORY:  He felt he could not afford pulmonary rehab program at [facility].  He did start trying to do some exercises on his own and says he feels  pretty well right now.  He is not coughing or wheezing.  No chest pain.  He continues BiPAP inspiratory 15, expiratory 12 each night.   MEDICATION:  1. Diovan 80 mg.  2. Plavix 75 mg.  3. Furosemide 80 mg.  4. Crestor 20 mg.  5. Glipizide 5 mg.  6. Potassium 20.  7. Sular 20 mg.  8. Isorbid 30 mg.  9. Metolazone 2.5 mg.  10.Aspirin 81 mg.  11.Combivent rescue inhaler 2 puffs q.i.d. p.r.n.  12.Advair 250/50.  13.No medication allergy.  14.Oxygen at 2-3 L continuously.   OBJECTIVE:  Weight 324 pounds, BP 122/68, pulse 92, oxygen saturation  here on 2 L with a demand regulator was 87%.  He is obese but does not  seem distressed.  Breath sounds are quite distant.  I do not hear rales  or rhonchi.  He is not using accessory muscles.  Neck veins are not  distended.  There is no stridor.  There is 1+ bilateral ankle edema.   IMPRESSION:  Problems as listed above with multifactorial chronic  respiratory failure, currently actually doing pretty well by his  baseline plan.  No changes to offer.  Scheduled to return in 4 months.  Encourage weight loss.     Clinton D. Maple Hudson, MD, FCCP, FACP     CDY/MedQ  DD: 12/27/2006  DT: 12/27/2006  Job #: [id number]   cc:   [name], M.D.  Nanetta Batty, M.D.

## 2010-12-30 NOTE — Discharge Summary (Signed)
[place]  Patient:    [name] [name], [name] [name]                   MRN: [id number] Adm. Date:  [id number] Disc. Date: 07/20/00 Attending:  Charlett Lango Dictator:   Myrlene Broker, P.A.-C. CC:         Darden Palmer., M.D.  Meliton Rattan, M.D.   Discharge Summary  DATE OF BIRTH:  Jan 09, 1951  PRIMARY ADMISSION DIAGNOSIS:  Severe three-vessel coronary artery disease, progressive class III angina.  OTHER PREEXISTING CONDITIONS/DIAGNOSES:  1. Non-insulin dependent diabetes mellitus.  2. Hypertension.  3. Hyperlipidemia.  4. History of nicotine abuse.  5. Obesity.  PROCEDURES:  Coronary artery bypass graft surgery x 6.  HOSPITAL COURSE:  The patient was admitted on July 12, 2000, by Dr. Dorris Fetch with the diagnosis of progressive class III angina with left main and three-vessel coronary artery disease, with the plan to have coronary artery bypass graft.  The patient had coronary artery bypass graft surgery x 6 on July 12, 2000.  The patients hospital course was ultimately uneventful.  On postoperative day #7, the patient did have a temperature of 100.8 and was treated accordingly with antibiotics.  The patient is ______ to be ready for discharge in the morning if afebrile for at least 24 hours.  CONDITION AT DISCHARGE:  The patient is improved and stable.  MEDICATIONS AT DISCHARGE:  The patient was discharged on the following medications:  1. Toprol XL 50 mg one tablet once a day.  2. Glucotrol XL 5 mg one tablet once a day with breakfast.  3. Lasix 40 mg one tablet a day.  4. K-Dur 20 mEq take one tablet once a day.  5. Darvocet-N 100 one or two tabs q.4-6h. as needed for pain.  6. Combivent metered dose inhaler one to two puffs q.4-6h.  7. Tequin 400 mg one tablet once a day x 5 days.  8. Colace 100 mg one tab p.o. twice a day and the patient is instructed     to hold for loose stools or diarrhea.  9. Aspirin 325  mg one p.o. once a day. 10. Multivitamin one a day. 11. Ferrous sulfate one a day.  ACTIVITY/DIET:  Activity and diet at discharge, the patient was instructed not to do any driving, lifting or pulling or pushing with the arms or shoulders and to progressively ambulate.  The patient was also instructed on a heart healthy diet, low cholesterol and diabetic diet.  To wash the wound with warm soap and water.  Use fresh linens each day.  The patient was also instructed to notify the office if temperature is greater than 100 degrees Fahrenheit, if if there are any changes in the wounds appearance such as pus or drainage in the wound or more swelling.  This is an addition that in consults, the patient was mobilized with phase I cardiac rehabilitation.  FOLLOW-UP:  The patient is to follow up with Dr. Dorris Fetch on August 15, 2000, at 10 a.m. and the patient was instructed to bring the chest x-ray from Dr. Tawana Scale office.  The patient is also instructed to follow up with his cardiologist, Dr. Lacretia Nicks. Ashley Royalty, and also his primary care physician, Dr. Meliton Rattan in a couple of months, and to follow up with Dr. Donnie Aho in a couple of weeks. DD:  07/19/00 TD:  07/20/00 Job: [id number]

## 2010-12-30 NOTE — Discharge Summary (Signed)
[name], [name]            ACCOUNT NO.:  [id number]   MEDICAL RECORD NO.:  [id number]          PATIENT TYPE:  INP   LOCATION:  0376                         [facility]ACILITY:  [facility]   PHYSICIAN:  Oley Balm. Sung Amabile, M.D. [facility] OF BIRTH:  1951/05/25   DATE OF ADMISSION:  06/27/2004  DATE OF DISCHARGE:  07/06/2004                                 DISCHARGE SUMMARY   DISCHARGE DIAGNOSES:  1.  Chronic hypercarbic respiratory failure with chronic obstructive      pulmonary disease, restrictive physiology.  2.  Obesity.  3.  Fibrothorax status post open heart surgery.  4.  Obesity hypoventilation syndrome who is BiPAP dependent. Of note, he      required mechanical ventilatory support and orotracheal intubation with      intubation on admission.  5.  Acute respiratory failure which has been resolved.  6.  Abnormal ECG on admission  7.  Metabolic alkalosis which is compensatory.  8.  Hypokalemia.  9.  Diabetes mellitus type 2.   HISTORY OF PRESENT ILLNESS:  [name] is a 60 year old African-  American male with extensive past medical history which includes a  fibrothorax following open heart surgery with congestive heart failure,  chronic O2 dependency. A 2-D echocardiogram at [facility] on September 2005 demonstrated ejection fraction of 55% with mild  left ventricular hypertrophy. He presented to the emergency department at  [facility] in acute respiratory distress with hypercarbic  respiratory failure with a pCO2 of 239. Required urgent oral tracheal  intubation which was a difficult intubation in the emergency room. Was  admitted to the intensive care unit for further evaluation and treatment. He  had an elevated BNP level. He was initially admitted by Dr. Viann Fish  and then changed to the pulmonary critical care service, i.e. Dr. Sung Amabile.   LABORATORY DATA:  Sodium 137, potassium 3.4, chloride 95, CO2 37, glucose  109, BUN 22, creatinine  0.9, calcium 8.9. B-type natriuretic peptide was  89.8. Arterial blood gases on 50% mechanical ventilatory support:  Pressure  regulated volume control tidal volume 650 rate of 10 with a pH of 7.22, pCO2  94, with a pO2 of 348 and a bicarb level of 40.7. Of note, initial pCO2 was  238 and had returned to a CO2 level of 83.1 by time of extubation. WBC 5.6,  hemoglobin 10.8, hematocrit 36.0, platelets 242. ESR was 69. Peak potassium  was 5.7, low potassium was 3.6. CK was 94, CK-MB 5.7, troponin I 1.7. TSH  1.263. Blood cultures showed no growth. Respiratory cultures showed normal  _______________ flora. Chest x-ray showed cardiomegaly and moderate  pulmonary edema and bilateral pleural effusions. No change since extubation.  Twelve lead EKG demonstrated sinus tachycardia with a rightward axis  deviation with a ventricular rate of 107 beats per minute.   HOSPITAL COURSE:  1.  Chronic hypercarbic respiratory failure secondary to chronic obstructive      pulmonary disease, restrictive physiology with obesity and fibrothorax.      Of note, he had had a pleural stripping performed at [facility] in September 2005. When admitted, had to be intubated due      to a hypercarbic state of pCO2 being 239. He responded to orotracheal      intubation and aggressive diuresis with 10 liters of fluid removed in 72      hours. He was successfully liberated from mechanical ventilatory support      within 3 days. He will remain on BiPAP inspiratory force of 15,      expiratory force of 12, with a 0.5 liter bleed in. It has been      instructed to him that this will be his lifeline to prevent further      damage to his heart from his obesity, hypoventilation, and obstructive      sleep apnea.  2.  Acute respiratory failure which has now been resolved, superimposed on      congestive heart failure. Again 10 liters of fluid were diuresed. His      discharge weight was last noted on  November 23 to 290.5 pounds. Of note,      initial weight was 325 pounds.  3.  Abnormal ECG on admission. This is most likely secondary to right heart      strain. CK-MB and troponin I were not overly or excessively elevated.      This was evaluated by Dr. Viann Fish, and no further interventions      were required.  4.  Hypokalemia. Potassium is being repleted.  5.  Diabetes mellitus type 2. This is being controlled with diet.   DISCHARGE MEDICATIONS:  1.  He will be on aspirin 325 mg 1 q.d.  2.  Advair 250/50 two times a day.  3.  Lasix 40 mg b.i.d.  4.  K-Dur 40 mEq b.i.d.  5.  Crestor 10 mg q.d.  6.  Oxygen 0.5 mg 24 hours a day, especially with sleep and/or activity.  7.  Robitussin DM 1 teaspoon 2 times a day as needed for cough.   ACTIVITY:  As tolerated.   DIET:  No added salt.   SPECIAL INSTRUCTIONS:  BiPAP as instructed with inspiratory of 15,  expiratory of 12 with an O2 bleed in 0.5 whenever asleep or lying down. He  has got an appointment to see nurse practitioner, Parrett, on November 30 at  10 a.m. At that time, he will be walked about without oxygen to see how to  evaluate his O2 levels. Note he dropped below 88% while in the hospital  being ambulated on room air. He will always need O2 at night due to his  obesity hypoventilation syndrome until he looses greater than 100 pounds.   DISPOSITION/CONDITION ON DISCHARGE:  Improved. His ventilator-dependent  respiratory failure has been resolved. His congestive heart failure is being  addressed with pharmaceutical interventions. His sleep apnea and obesity  hypoventilation syndrome is being addressed with oxygen and BiPAP. He is  being discharged home in improved condition.      SM/MEDQ  D:  07/06/2004  T:  07/06/2004  Job:  [id number]

## 2010-12-30 NOTE — Letter (Signed)
October 08, 2007    Trial Court Administrator  Attn:  Lonn Georgia Request  [address]  [place], [place] [address]   RE:  [name], [name]  MRN:  [id number]  /  DOB:  1951-05-15   Reference Juror #[id number], panel number [phone].   To whom it may concern:   I am a pulmonary medicine physician caring for [name],  who has been summoned for jury duty at the [facility]  Wednesday, November 06, 2007 at 8:30 a.m. as juror [phone].  [name]  is dependent on home oxygen at a continuous rate on a permanent basis  because of severe chronic obstructive lung disease and chronic  congestive heart failure.  He is not medically able to perform the juror  responsibilities and is not expected to be medically able again in the  future.  Thank you.    Sincerely,      Clinton D. Maple Hudson, MD, Tonny Bollman, FACP  Electronically Signed    CDY/MedQ  DD: 10/08/2007  DT: 10/09/2007  Job #: [id number]   CC:    Hillery Jacks

## 2010-12-30 NOTE — Cardiovascular Report (Signed)
[place]  Patient:    [name] [name], [name] [name]                   MRN: [id number] Proc. Date: 06/28/00 Adm. Date:  [id number] Attending:  Norman Clay CC:         Meliton Rattan, M.D., at [facility]   Cardiac Catheterization  HISTORY OF PRESENT ILLNESS:  A 60 year old black male with a three-year history of diabetes mellitus, hypertension, mild hyperlipidemia, and cigarette abuse.  He presented with progressive chest pain and was found to have an abnormal treadmill test.  COMMENTS ABOUT PROCEDURES:  The cardiac catheterization was done without difficulty.  The right femoral artery was entered with a single anterior wall needle stick.  The catheterized angiograms were made using 6 French catheters and a 30 cc ventriculogram was performed.  The artery underwent percutaneous suture-mediated closure with the Perclose device with good hemostasis at the end of the procedure and Ancef 1 g was given.  The patient tolerated the procedure well.  HEMODYNAMIC DATA:  Aorta post contrast:  151/100.  LV post contrast:  151/20 to 24.  ANGIOGRAPHIC DATA:  Left ventriculogram:  Performed in the 30 degree RAO projection.  The aortic valve was normal.  The mitral valve was normal.  The left ventricle appears normal in size.  The estimated ejection fraction was 60%.  Coronary arteries arise and distribute normally.  There was no significant coronary calcification present.  The left main coronary artery has a distal 40 to 50% narrowing.  The left anterior descending has an eccentric segmental 70 to 90% stenosis in the proximal portion of the vessel.  The circumflex coronary artery has an ostial 50 to 60% stenosis noted.  The first marginal branch has an ostial 60 to 70% stenosis noted.  There is moderate diffuse disease involving the first marginal branch.  The right coronary artery ends in a single posterior descending artery.  There is a 60 to 70% stenosis  in the distal portion of the vessel prior to the posterior descending artery.  IMPRESSION: 1. Moderate left main and severe three-vessel coronary artery disease. 2. Normal left ventricular function.  RECOMMENDATIONS:  Consider coronary artery bypass grafting in a patient who is a diabetic with multivessel disease.DD:  06/28/00 TD:  06/28/00 Job: [id number]

## 2010-12-30 NOTE — H&P (Signed)
[name] [name], [name] [name]            ACCOUNT NO.:  [id number]   MEDICAL RECORD NO.:  [id number]          PATIENT TYPE:  INP   LOCATION:  0165                         FACILITY[facility]  [facility]   PHYSICIAN:  Darden Palmer., M.D.DATE OF BIRTH:  1951/01/17   DATE OF ADMISSION:  06/27/2004  DATE OF DISCHARGE:                                HISTORY & PHYSICAL   TIME OF CRITICAL CARE:  9:10 a.m. to 10 a.m.   HISTORY OF PRESENT ILLNESS:  This 60 year old black male was admitted as an  emergency patient, critically ill for evaluation of respiratory failure and  congestive heart  failure.  He was initially seen in November of 2001, at  which point in time he had the diagnosis of severe obesity, diabetes, and  hyperlipidemia.  He had an abnormal treadmill test and had cardiac  catheterization that showed three vessel coronary artery disease.  He  underwent coronary bypass grafting by Dr. Dorris Fetch with an internal  mammary graft to the LAD, a saphenous vein graft to the first diagonal and  intermediate branch, a vein graft to the obtuse marginal 1 and 2, and a vein  graft to the distal right coronary artery.  He has not been able to return  to work following that.  He was last seen by me in 2002 with severe dyspnea  on exertion and his blood pressure was not well-controlled.  He was then  seen with wheezing in February of 2003 and had concentric left ventricular  hypertrophy.  A BNP level was low but he had significant wheeze and  shortness of breath and was referred to Dr. Sung Amabile.  He was found to have  possible sleep apnea and had pleural densities and was eventually evaluated  and was sent to [facility] and underwent decortication of the right lung  in September of this year.  He was hospitalized following that with  hypoxemia.  Over the past two weeks he has had progressive shortness of  breath, increasing edema, but has not been seen by me since February of  2003.  He presented to the  emergency room  this morning with severe  shortness of breath and was placed on BiPAP.  He was found to be in  congestive heart failure with a BNP level of greater than 1100.  I was  called to see the patient.  When I saw him he was noted to have significant  anterior T-wave inversions on EKG and also was obtunded.  His pH was 6.9,  PCO2 was greater than 200, and PO2 was 100.  I called anesthesia and  intubated the patient and he is transferred in critical care to the  intensive care unit at this time.   PAST MEDICAL HISTORY:  1.  Remarkable for severe morbid obesity.  2.  He has a prior history of hypertension.  3.  Diabetes mellitus.  4.  Hyperlipidemia.  5.  He has a prior history of tobacco abuse.   PAST SURGICAL HISTORY:  1.  Coronary bypass surgery.  2.  Recent decortication of lung.  3.  Hemorrhoidectomy.  4.  Appendectomy.  ALLERGIES:  None.   MEDICATIONS:  1.  He was listed as being on DynaCirc.  2.  Crestor.  3.  Glipizide.  4.  Aspirin.  5.  Allopurinol.   FAMILY HISTORY:  Father died in his 8s of a heart attack.  Mother is 24 and  has hypertension.  Two brothers healthy.  Two sisters have hypertension.   SOCIAL HISTORY:  He has been married for 18 years, has no children.  He  previously smoked prior to his bypass, would drink occasional alcohol.   REVIEW OF SYSTEMS:  He has been very large and obese for years.  It is  essentially difficult to obtain the review of systems due to his obtunded  state.   PHYSICAL EXAMINATION:  GENERAL:  He is a very large male who is obtunded and  is currently on the respirator.  VITAL SIGNS:  His blood pressure is currently 120/74, pulse was 90, in sinus  rhythm.  SKIN:  Warm and dry.  HEENT:  EOMI. PERRLA, fundi not seen  NECK:  Thick.  Can assist.  JVD.  LUNGS:  Bilateral rhonchi and rales.  CARDIAC:  Loud S3 gallop noted.  No murmur.  ABDOMEN:  Very distended and obese.  EXTREMITIES:  He has 3+ peripheral edema.   Previous saphenous harvesting  scar noted in the left lower extremity as well as previous median sternotomy  scar.  NEUROLOGIC;  He is obtunded but does move his extremities.   LABORATORY DATA:  EKG shows a previous inferior infarct, anterior T-wave  inversions noted.  Chest x-ray shows pulmonary edema, somewhat small volume  lungs.   His creatinine is elevated at 1.8, potassium is slightly elevated at 5.5.  Glucose is elevated.  1.  Acute respiratory failure with pulmonary edema.  2.  Coronary artery disease with previous bypass grafting in 2001.  3.  Congestive heart failure with elevated NHP.  4.  Diabetes.  5.  Severe morbid obesity.  6.  Possible sleep apnea.  7.  Recent decortication of right lung.   RECOMMENDATIONS:  The patient is critically ill.  He is intubated and will  be mechanically ventilated.  It is recommended that he have an arterial  central line placed, then be supported.  I will ask for critical care  consult.  He will have a myocardial infarction ruled out, be covered with  Lovenox for DVT prophylaxis, and diuresed at this point in time.  Obtain old  records from [facility].  He is critically ill and this was discussed  with his wife.     Kristine Royal   WST/MEDQ  D:  06/27/2004  T:  06/27/2004  Job:  [id number]   cc:   [name], M.D.  [address]  [place]  [place] [address]  Fax: [phone]

## 2010-12-30 NOTE — Cardiovascular Report (Signed)
NAMEMarland Kitchen  GREYDON, BETKE NO.:  [id number]   MEDICAL RECORD NO.:  [id number]          PATIENT TYPE:  OIB   LOCATION:  2899                         FACILITY:  [facility]   PHYSICIAN:  Nanetta Batty, M.D.   DATE OF BIRTH:  09/04/1950   DATE OF PROCEDURE:  08/05/2004  DATE OF DISCHARGE:                              CARDIAC CATHETERIZATION   INDICATIONS:  Mr. Hardgrove is a 60 year old mildly overweight African-  American male with a history of coronary artery disease and congestive heart  failure.  He has had fibrothorax and has undergone decortication at [facility]  Presently, the problems include hypertension, hyperlipidemia, diabetes, and  obesity.  He has had episode of congestive heart failure recently.  Cardiolite performed in our office August 01, 2004 showed inferolateral  ischemia.  He has had coronary artery bypass grafting in 2001 by Dr. Andrey Spearman with LIMA to his LAD, sequential vein graft to diagonal one and  ramus, sequential vein graft to OM-1 and 2 and vein graft to RCA.  He  presents now for diagnostic coronary arteriography to define his anatomy and  to rule out an ischemic substraight.   PROCEDURE DESCRIPTION:  The patient was brought to the second floor [facility] in the postabsorptive state.  He was  premedicated with p.o. Valium.  His right groin was prepped and shaved in  the usual sterile fashion.  1% Xylocaine was used for local anesthesia.  A 6  French sheath was inserted into the right femoral artery using standard  Seldinger technique. A 6 French right and left Judkins diagnostic catheter  as well as 6 French pigtail catheter and a right bypass graft catheter were  used for selective coronary angiography, left ventriculography, selective  IMA angiography, selective vein graft angiography, and distal abdominal  aortography.  Visipaque dye was used for the entirety of the case.  Retrograde aortic, ventricular,   pullback pressures were recorded.   HEMODYNAMICS:  1.  Aortic systolic pressure 139, diastolic pressure 82.  2.  Left ventricular systolic pressure 140, end-diastolic pressure 12.   SELECTIVE CORONARY ANGIOGRAPHY:  1.  Left main normal.  2.  LAD:  Occluded at the ostium.  3.  Ramus intermedius:  Diffusely diseased at its origin in 90% range with      competitive flow.  4.  Left circumflex:  This is a large vessel with 50% hypodense proximal      lesion.  The circumflex gave off a large marginal branch that had 60%      hypodense ostial stenosis.  Just beyond the take off of the OM and the      AV circumflex was a 50% hypodense lesion as well.  There was an intact      limb to the sequential vein graft connecting the first and second      marginal branches.  However, the proximal limb was occluded.  5.  Right coronary artery:  Large dominant vessel with 80% stenosis at the      genu and competitive flow distally.  There was a large acute marginal  branch noted.  There was also 60% ostial stenosis.  6.  Vein graft to the RCA:  Widely patent.  There was some narrowing of the      vein at the aorta.  However, this did not appear to be atherosclerotic.  7.  Vein graft to the circumflex marginal sequentially:  Occluded at the      aorta.  8.  Vein graft to the ramus branch widely patent.  9.  LIMA to the LAD:  Widely patent with 50% segmental stenosis in the LAD      just beyond the LIMA insertion and 80% segmental stenosis in the LAD in      the apical portion.  The LAD did wrap the apex.   LEFT VENTRICULOGRAPHY:  RAO left ventriculogram was performed using 25 mL of  Visipaque dye at 12 mL per second.  The overall LVEF is estimated at  approximately 55% with mild inferobasal hypokinesia.   DISTAL ABDOMINAL AORTOGRAPHY:  Distal abdominal aortogram was performed  using 25 mL of Visipaque dye, 20 mL per second.  The renal arteries were  widely patent.  The infrarenal abdominal aorta  and iliac bifurcation appear  free of significant atherosclerotic changes.   IMPRESSION:  Mr. Wert has occluded sequential circumflex vein graft  from the aorta to the first marginal branch.  However, the limb connecting  the first and second marginal branches is widely patent.  He does have high  grade disease in the origin of the marginal.  The remainder of his grafts  are patent and he has normal left ventricular function.  I am going to  perform IVUS-guided PCI and stenting using glycoprotein 2b3a inhibition.   PROCEDURE DESCRIPTION:  The existing 6 French sheath in the right femoral  artery was exchanged over wire for a 7 [place] sheath.  A 6 French sheath was  then inserted into the right femoral vein.  Using a 7 [place] JL-4 guide  catheter along with a 0.014-190 Asahi soft guide wire and a 3.2 [place]  Galaxy IVUS catheter, intravascular ultrasound was performed.  There did  appear to be a large plaque burden in the AV circumflex beyond the marginal  take off.  The proximal circumflex was a large vessel with eccentric plaque.  However, this did not appear to be occlusive.  The wire was then withdrawn  and placed into the marginal branch which revealed occlusive plaque at the  origin with a reference segment of 2.5 x 2.9 and a lesion diameter of 1.5 x  1.7 mm.   The patient received a total of 7500 units of heparin intravenously with a  peak ACT of 279.  He was on aspirin and received 600 mg of Plavix p.o. as  well as integrilin double bolus and infusion.  The ostium of the circumflex  marginal branch was then atherectomized with a 2.5 x 6 cutting balloon under  angiographic and fluoroscopic control.  A 2.5/13 Cypher stent was then  advanced over the Asahi wire and positioned at the ostium of the circumflex  marginal branch.  This was inflated to 14 atmospheres.  200 mcg of  intracoronary nitroglycerin was administered.  The final angiographic result with reduction of a 60%  hypodense angiographic and higher grade by  ultrasound proximal circumflex marginal lesion to 0% residual with excellent  flow.   IMPRESSION:  Successful IVUS-guided circumflex obtuse marginal  PCI and  stenting using drug-eluting stent.  The proximal circumflex lesion did not  appear to  be hemodynamically significant and the limb of the vein graft  between the two marginal branches was patent.  The guide wire and catheters  were removed.  The sheaths were sewn securely in place.  The patient left  the lab in stable condition.  Plans will be to continue with integrilin for  18  hours.  The patient will be treated with aspirin and Plavix and discharged  home in the morning and will see him back in the office in approximately 1-2  weeks.  If the patient has recurrent symptoms, it is possible to stent the  proximal circumflex if that lesion progresses.  He left the lab in stable  condition.      JB/MEDQ  D:  08/05/2004  T:  08/05/2004  Job:  [id number]   cc:   [facility]  [address]  [place], [place] [address]   Cephas Darby. Daphine Deutscher, M.D.  [address]  [place]  [place] [address]  Fax: [phone]

## 2010-12-30 NOTE — Op Note (Signed)
[place]  Patient:    [name] [name], [name] [name]                   MRN: [id number] Proc. Date: 07/12/00 Adm. Date:  [id number] Attending:  Charlett Lango CC:         Darden Palmer., M.D.  Meliton Rattan, M.D.   Operative Report  PREOPERATIVE DIAGNOSES:  Progressive class III angina with left main and 3 vessel coronary disease.  POSTOPERATIVE DIAGNOSES:  Progressive class III angina with left main and 3 vessel coronary disease.  OPERATION:  Median sternotomy, extracorporeal circulation, coronary artery bypass graft x 6 (left internal mammary to left anterior descending, sequential vein graft to first diagonal and ramus intermedius, sequential saphenous vein graft to obtuse marginal 1 and 2, saphenous vein graft to distal right coronary).  SURGEON:  Salvatore Decent. Dorris Fetch, M.D.  ASSISTANT:  Areta Haber, P.A. and [place], P.A.  ANESTHESIA:  General.  FINDINGS:  Thick walled fair quality mammary artery good quality targets, good quality left ventricular hypertrophy.  CLINICAL NOTE:  Mr. Dec is a 60 year old African American male with non-insulin dependent diabetes, newly diagnosed hypertension, and hyperlipidemia.  He presented with progressive class III angina.  On cardiac catheterization he was found to have 3 vessel coronary disease, and left main coronary disease with preserved left ventricular function.  The patient was referred for coronary artery bypass grafting.  The indications, risks, benefits, and alternatives were discussed in detail with the patient and he accepted the risks and agreed to proceed.  OPERATIVE NOTE:  Mr. Ram was brought to the preoperative holding area on July 12, 2000.  Lines were placed to monitor arterial, central, venous, pulmonary and arterial pressure.  Electrocardiogram leads were placed for continuous telemetry.  The patient was taken to the operating room, anesthetized and  intubated.  A Foley catheter was placed.  Intravenous antibiotics were administered.  The chest, abdomen, and legs were prepped and draped in the usual fashion.  A median sternotomy was performed.  Simultaneously an incision was made in the medial aspect of the left leg and the greater saphenous vein was harvested from the ankle to the thigh.  The saphenous vein was relatively thick walled and of fair quality.  The left internal mammary artery was harvested in the standard fashion.  It was a 2 mm good quality graft.  The patient was fully heparinized prior to dividing the distal end of the mammary artery.  There was excellent flow through the cut end of the vessel. The mammary was placed in a papaverine soaked sponge, and placed into the left pleural space.  The pericardium was opened.  The ascending aorta was palpated.  There was no palpable atherosclerotic disease.  The aorta was cannulated via concentric 2-0 Ethibond pledgets of pursestring sutures.  The dual staged venous cannulas were placed via pursestring suture in the right atrial appendage.  Cardiopulmonary bypass was instituted and the patient was cooled to 32 degrees Celsius.  The coronary arteries were inspected and anastomotic sites were chosen.  The conduits were inspected and cut to length.  A foam pad was placed in the pericardium to protect the left phrenic nerve.  A temperature probe was placed in the myocardial septum and a cardioplegia cannula was placed in the ascending aorta.  The aorta was crossclamped.  The left ventricle was emptied via the aortic root vac.  Cardiac arrest then was achieved with a combination of cold antegrade blood cardioplegia  and topical iced saline.  1200 cc of cardioplegia was administered.  Myocardial septal temperature was 12 degrees Celsius. The following distal anastomosis were performed.  First a reversed saphenous vein graft was anastomosed end-to-side to the distal right  coronary.  The distal right coronary was 2 mm at the site of the anastomosis and gave rise to a single a posterior descending branch which then bifurcated.  The anastomosis was performed with a running 7-0 Prolene suture. There was excellent flow through the graft.  Cardioplegia was administered and there was good hemostasis.  Next, a reverse saphenous vein graft was placed sequentially to the first diagonal and ramus intermedius.  The first diagonal was a 1.3 mm vessel.  It was diffusely diseased proximally.  The anastomosis was performed side-to-side off a side branch of the vein graft using a running 7-0 Prolene suture and was probed proximally and distally to ensure patency.  There was good flow through the anastomosis.  An end-to-side anastomosis then was performed to the ramus intermedius.  This was a 2 mm vessel which was good quality and became intramyocardial just beyond the site of the anastomosis.  There was excellent flow through the graft.  Cardioplegia was administered and there was good hemostasis at both anastomosis.  Next, a reverse saphenous vein graft was placed sequentially to the first and second obtuse marginal branches of the left circumflex coronary artery.  These were both posterolateral branches.  A side-to-side anastomosis was performed off of the side branch of the vein graft to OM-1 and an end-to-side anastomosis was performed to OM-2.  Both anastomosis were performed with running 7-0 Prolene sutures.  Both target vessels were 2 mm in diameter.  Again, there was excellent flow through the graft.  Cardioplegia was administered.  There was a small leak from the ______ of the OM-1 anastomosis which was repaired with a single 7-0 Prolene suture.  Next, the left internal mammary artery was brought through a window in the pericardium anterior to the left phrenic nerve.  The distal end of the mammary artery was spatulated and was anastomosed end-to-side to the mid  LAD using a  running 8-0 Prolene suture.  Both the mammary and the LAD were 2 mm good quality vessels.  At the completion of the mammary to LAD anastomosis the bulldog clamp was removed from the mammary artery.  Immediate and rapid septal rewarming was noted.  Lidocaine was administered.  The mammary pedicle was tacked to the epicardial surface of the heart with 6-0 Prolene sutures.  The aortic crossclamp was removed.  Total crossclamp time was 72 minutes.  The patient required 3 defibrillations and then resumed sinus rhythm and remained in sinus rhythm throughout the remainder of the procedure.  A partial occluding clamp was placed on the ascending aorta.  The cardioplegia cannula was removed.  The proximal vein graft anastomosis were performed to 4.0 mm punch aortotomies with running 6-0 Prolene sutures.  At completion of the final proximal anastomosis, the patient was placed in the Trendelenburg position.  Air was allowed to vent as the partial clamp was removed and then the suture was tied.  Air was aspirated from each of the vein grafts.  The bulldog clamps were removed.  All proximal and distal anastomosis were inspected for hemostasis and the patient was rewarmed.  Epicardial pacing wires were placed on the right atrium and the right ventricle.  When the core temperature reached 37 degrees Celsius the patient was weaned from cardiopulmonary bypass.  The  patient weaned from bypass without difficulty  A dopamine drip had been instituted prior to discontinuing bypass.  Total bypass time was 144 minutes.  A test does of protamine was administered and was well tolerated. The atrial and aortic cannulae were removed.  The patient remained hemodynamically stable stable throughout the post bypass period.  There was good hemostasis at both cannulation sites.  The remainder of the protamine was administered without incident. The chest was irrigated with 1 liter of warm normal  saline containing 1 gram of vancomycin.  Hemostasis was achieved.  A left pleural and two mediastinal chest tubes were placed through a separate subcostal incisions.  The blake drain was placed in the leg incision.  The sternum was closed with heavy gauge interrupted stainless steel wires.  The pectoralis fascia was closed with a running #1 Vicryl suture in both the chest and the leg.  The subcutaneous tissue was closed with a running 2-0 Vicryl suture and the skin was closed with a 3-0 Vicryl subcuticular suture.  All sponge, needle, and instrument counts were correct at the end of the procedure.  There were no intraoperative complications.  The patient was taken from the operating room to the surgical intensive care unit intubated in stable condition. DD:  07/12/00 TD:  07/12/00 Job: [id number]

## 2010-12-30 NOTE — Discharge Summary (Signed)
NAMEMarland Kitchen  [name] [name], [name] [name] NO.:  [id number]   MEDICAL RECORD NO.:  [id number]          PATIENT TYPE:  OIB   LOCATION:  6522                         FACILITY:  [facility]   PHYSICIAN:  Nanetta Batty, M.D.   DATE OF BIRTH:  1951-05-17   DATE OF ADMISSION:  08/05/2004  DATE OF DISCHARGE:  08/06/2004                                 DISCHARGE SUMMARY   DISCHARGE DIAGNOSES:  1.  Angina.  2.  Abnormal Cardiolite study.  3.  Coronary artery disease undergoing percutaneous coronary intervention of      the left circumflex obtuse marginal with a Cypher drug-eluding stent.      Grafts were patent.  4.  Diabetes mellitus.  5.  History of congestive heart failure.  6.  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  7.  History of bypass grafting in 2000.  8.  Hyperlipidemia.  9.  Chronic obstructive pulmonary disease with home oxygen use.   DISCHARGE MEDICATIONS:  1.  Glucotrol XL 5 mg daily.  2.  Lasix 40 mg daily.  3.  Sular 20 mg daily.  4.  K-Dur 40 mEq twice a day.  5.  Crestor 10 mg daily.  6.  Plavix 75 mg one daily.  Do not stop.  7.  Enteric-coated aspirin 81 mg daily.  8.  Multivitamins daily.  9.  Advair one puff twice a day.  10. Combivent as needed as before.  11. Nitroglycerin sublingual p.r.n. chest pain.  12. Oxygen as before.   DISCHARGE INSTRUCTIONS:  1.  For pain management, use nitroglycerin.  2.  No strenuous activity.  No lifting over 10 pounds for three days and      then may resume regular activity.  3.  Low-fat, low-salt, diabetic diet.  4.  Wash the right groin catheterization site with soap and water.  Call if      any pain, swelling or drainage.  5.  Follow up with Dr. Allyson Sabal.  The office should call you on Tuesday with      appointment date and time.   HISTORY OF PRESENT ILLNESS:  A 60 year old African American male with a  history of coronary artery disease and heart failure, also fibrothorax and  undergone decortication.  He also has hypertension, hyperlipidemia, diabetes  and obesity.  He had been seen in the emergency room last weekend for chest  pain and shortness of breath.  He was given diuretic and his oxygen was  titrated.  He then had a Cardiolite stress test which showed inferolateral  ischemia with preserved EEF.  Therefore, Dr. Allyson Sabal felt the patient needed  cardiac catheterization to evaluate his coronary arteries.  At that time,  the plan had been for Dr. Maple Hudson to see for pulmonary, but that will need to  be an outpatient evaluation.  Outpatient medications were the same as  inpatient, except for the addition of Plavix and Protonix.   ALLERGIES:  No known allergies.   FAMILY HISTORY:  See office visit prior to admission.   SOCIAL HISTORY:  See office visit prior  to admission.   REVIEW OF SYSTEMS:  See office visit prior to admission.   PHYSICAL EXAMINATION AT DISCHARGE:  VITAL SIGNS:  Blood pressure 152/84,  pulse 84, respirations 20, temperature 98.2 degrees, oxygen saturation on 2  L 94%.  GENERAL APPEARANCE:  An alert and oriented African American male in no acute  distress.  Oxygen in place.  HEART:  The heart rate is a little tachycardic.  LUNGS:  Diminished breath sounds throughout.  ABDOMEN:  Obese and nontender.  Right groin catheterization site stable.   LABORATORY VALUES:  Prior to admission, the CBC was stable, PT stable and  TSH stable.  Potassium was 5.4.  Prior to discharge, sodium 141, potassium  4.3, BUN 7, creatinine 0.6, glucose 100.  Hemoglobin 11.3, hematocrit 36,  platelets 150, WBC 4.4.  ANA is pending.  Chest x-ray done on July 30, 2004, showed mild heart failure with effusion.  This is from the ER visit  when he was given diuretics.   PROCEDURES:  Combined left catheterization with graft visualization by Dr.  Allyson Sabal on August 05, 2004, and then PTCA and Cypher stent deployment to  left circumflex OM.   HOSPITAL COURSE:  Mr.  Haughn was admitted on August 05, 2004, and  underwent cardiac catheterization for angina and positive Cardiolite study  and underwent Cypher stenting to the left circumflex OM saphenous vein  graft.  The patient tolerated the procedure well.  In the morning of  August 06, 2004, he was seen and evaluated by Gerlene Burdock A. Alanda Amass, M.D.,  and felt ready for discharge home.  The right groin catheterization site was  stable.  He will follow up as an outpatient.  He will need pulmonary consult  as an outpatient also.      LRI/MEDQ  D:  08/06/2004  T:  08/08/2004  Job:  [id number]   cc:   Quita Skye. Hart Rochester, M.D.  [address]  [place]  [place] [address]   Cephas Darby. Daphine Deutscher, M.D.  [address]  [place]  [place] [address]  Fax: [phone]   Clinton D. Maple Hudson, M.D.

## 2011-01-02 ENCOUNTER — Ambulatory Visit (HOSPITAL_COMMUNITY)
Admission: RE | Admit: 2011-01-02 | Discharge: 2011-01-02 | Disposition: A | Discharge status: Home / Self Care | Payer: Medicare Other | Source: Ambulatory Visit | Attending: Nephrology | Admitting: Nephrology

## 2011-01-02 DIAGNOSIS — N179 Acute kidney failure, unspecified: Secondary | ICD-10-CM | POA: Insufficient documentation

## 2011-01-02 DIAGNOSIS — Z01818 Encounter for other preprocedural examination: Secondary | ICD-10-CM | POA: Insufficient documentation

## 2011-01-02 DIAGNOSIS — Z01812 Encounter for preprocedural laboratory examination: Secondary | ICD-10-CM | POA: Insufficient documentation

## 2011-01-02 LAB — COMPREHENSIVE METABOLIC PANEL
AST: 16 U/L (ref 0–37)
Albumin: 3.1 g/dL — ABNORMAL LOW (ref 3.5–5.2)
BUN: 42 mg/dL — ABNORMAL HIGH (ref 6–23)
Calcium: 9.7 mg/dL (ref 8.4–10.5)
Creatinine, Ser: 2.3 mg/dL — ABNORMAL HIGH (ref 0.4–1.5)
GFR calc Af Amer: 36 mL/min — ABNORMAL LOW (ref >60)
Total Bilirubin: 0.2 mg/dL — ABNORMAL LOW (ref 0.3–1.2)

## 2011-01-02 LAB — CBC
HCT: 27.5 % — ABNORMAL LOW (ref 39.0–52.0)
Hemoglobin: 8.2 g/dL — ABNORMAL LOW (ref 13.0–17.0)
MCV: 90.5 fL (ref 78.0–100.0)
RBC: 3.04 MIL/uL — ABNORMAL LOW (ref 4.22–5.81)
WBC: 9.9 10*3/uL (ref 4.0–10.5)

## 2011-01-02 LAB — APTT: aPTT: 29 seconds (ref 24–37)

## 2011-01-02 LAB — PROTIME-INR
INR: 1.02 (ref 0.00–1.49)
Prothrombin Time: 13.6 seconds (ref 11.6–15.2)

## 2011-01-03 ENCOUNTER — Ambulatory Visit (HOSPITAL_COMMUNITY): Payer: Medicare Other

## 2011-01-03 LAB — PLATELET FUNCTION ASSAY
Collagen / ADP: 102 seconds (ref 0–114)
Collagen / Epinephrine: >300 seconds (ref 0–197)

## 2011-01-05 ENCOUNTER — Encounter: Payer: Self-pay | Admitting: Internal Medicine

## 2011-01-05 ENCOUNTER — Ambulatory Visit (HOSPITAL_COMMUNITY)
Admission: RE | Admit: 2011-01-05 | Discharge: 2011-01-05 | Disposition: A | Discharge status: Home / Self Care | Payer: Medicare Other | Source: Ambulatory Visit | Attending: Nephrology | Admitting: Nephrology

## 2011-01-05 DIAGNOSIS — N179 Acute kidney failure, unspecified: Secondary | ICD-10-CM | POA: Insufficient documentation

## 2011-01-05 LAB — TYPE AND SCREEN
ABO/RH(D): O POS
Antibody Screen: NEGATIVE

## 2011-01-05 LAB — PLATELET FUNCTION ASSAY

## 2011-01-06 ENCOUNTER — Observation Stay (HOSPITAL_COMMUNITY)
Admission: RE | Admit: 2011-01-06 | Discharge: 2011-01-07 | Disposition: A | Discharge status: Home / Self Care | Payer: Medicare Other | Source: Ambulatory Visit | Attending: Nephrology | Admitting: Nephrology

## 2011-01-06 ENCOUNTER — Other Ambulatory Visit: Payer: Self-pay | Admitting: Nephrology

## 2011-01-06 DIAGNOSIS — D638 Anemia in other chronic diseases classified elsewhere: Secondary | ICD-10-CM | POA: Insufficient documentation

## 2011-01-06 DIAGNOSIS — R809 Proteinuria, unspecified: Secondary | ICD-10-CM

## 2011-01-06 DIAGNOSIS — E119 Type 2 diabetes mellitus without complications: Secondary | ICD-10-CM | POA: Insufficient documentation

## 2011-01-06 DIAGNOSIS — D62 Acute posthemorrhagic anemia: Secondary | ICD-10-CM | POA: Insufficient documentation

## 2011-01-06 DIAGNOSIS — I10 Essential (primary) hypertension: Secondary | ICD-10-CM | POA: Insufficient documentation

## 2011-01-06 DIAGNOSIS — R300 Dysuria: Secondary | ICD-10-CM | POA: Insufficient documentation

## 2011-01-06 DIAGNOSIS — N179 Acute kidney failure, unspecified: Principal | ICD-10-CM | POA: Insufficient documentation

## 2011-01-06 DIAGNOSIS — G4733 Obstructive sleep apnea (adult) (pediatric): Secondary | ICD-10-CM | POA: Insufficient documentation

## 2011-01-06 DIAGNOSIS — Z01812 Encounter for preprocedural laboratory examination: Secondary | ICD-10-CM | POA: Insufficient documentation

## 2011-01-06 LAB — CBC
HCT: 27.1 % — ABNORMAL LOW (ref 39.0–52.0)
MCH: 26.3 pg (ref 26.0–34.0)
MCV: 90.3 fL (ref 78.0–100.0)
Platelets: 191 10*3/uL (ref 150–400)
RBC: 3 MIL/uL — ABNORMAL LOW (ref 4.22–5.81)
RDW: 14 % (ref 11.5–15.5)
WBC: 4.4 10*3/uL (ref 4.0–10.5)

## 2011-01-06 LAB — URINALYSIS, ROUTINE W REFLEX MICROSCOPIC
Bilirubin Urine: NEGATIVE
Glucose, UA: NEGATIVE mg/dL
Ketones, ur: NEGATIVE mg/dL
pH: 6 (ref 5.0–8.0)

## 2011-01-06 LAB — RENAL FUNCTION PANEL
BUN: 33 mg/dL — ABNORMAL HIGH (ref 6–23)
Chloride: 99 mEq/L (ref 96–112)
Creatinine, Ser: 1.85 mg/dL — ABNORMAL HIGH (ref 0.4–1.5)
Glucose, Bld: 139 mg/dL — ABNORMAL HIGH (ref 70–99)
Potassium: 4.7 mEq/L (ref 3.5–5.1)

## 2011-01-06 LAB — URINE MICROSCOPIC-ADD ON

## 2011-01-07 LAB — RENAL FUNCTION PANEL
CO2: 39 mEq/L — ABNORMAL HIGH (ref 19–32)
Calcium: 9.3 mg/dL (ref 8.4–10.5)
GFR calc Af Amer: 49 mL/min — ABNORMAL LOW (ref >60)
GFR calc non Af Amer: 40 mL/min — ABNORMAL LOW (ref >60)
Sodium: 142 mEq/L (ref 135–145)

## 2011-01-07 LAB — CBC
Hemoglobin: 7.8 g/dL — ABNORMAL LOW (ref 13.0–17.0)
MCHC: 29.3 g/dL — ABNORMAL LOW (ref 30.0–36.0)
RBC: 2.96 MIL/uL — ABNORMAL LOW (ref 4.22–5.81)
WBC: 5.4 10*3/uL (ref 4.0–10.5)

## 2011-01-07 LAB — IRON AND TIBC
Iron: 30 ug/dL — ABNORMAL LOW (ref 42–135)
UIBC: 188 ug/dL

## 2011-01-08 LAB — URINE CULTURE
Colony Count: 75000
Culture  Setup Time: 201205252043

## 2011-01-09 LAB — GLUCOSE, CAPILLARY: Glucose-Capillary: 134 mg/dL — ABNORMAL HIGH (ref 70–99)

## 2011-01-17 ENCOUNTER — Ambulatory Visit: Payer: Self-pay | Admitting: Internal Medicine

## 2011-01-20 ENCOUNTER — Encounter: Payer: Self-pay | Admitting: Internal Medicine

## 2011-01-23 ENCOUNTER — Ambulatory Visit (INDEPENDENT_AMBULATORY_CARE_PROVIDER_SITE_OTHER): Payer: Medicare Other | Admitting: Internal Medicine

## 2011-01-23 ENCOUNTER — Encounter: Payer: Self-pay | Admitting: Internal Medicine

## 2011-01-23 VITALS — BP 118/70 | HR 81 | Ht 73.0 in | Wt 290.0 lb

## 2011-01-23 DIAGNOSIS — J9610 Chronic respiratory failure, unspecified whether with hypoxia or hypercapnia: Secondary | ICD-10-CM

## 2011-01-23 DIAGNOSIS — G4730 Sleep apnea, unspecified: Secondary | ICD-10-CM

## 2011-01-23 DIAGNOSIS — E678 Other specified hyperalimentation: Secondary | ICD-10-CM

## 2011-01-23 NOTE — Patient Instructions (Addendum)
Orders-----    [facility]--     Need autotitration  CPAP trial x 2 weeks  10-20 cwp , with O2 at 3 L/M for pressure check.                                        During that 2 weeks, need one night as an ONOX on Autopap and room air  Then he will resume his usual BIPAP 15/12  With O2 at 3 L/M     Sample Astepro nasal spray for watery nose, 1 puff twice daily if needed.

## 2011-01-23 NOTE — Assessment & Plan Note (Signed)
He felt better on autotitration CPAP while in hospital and has questions about need for O2 in sleep. We will try to coordinate autotitration trial and ONOx on AutoPAP on room air

## 2011-01-23 NOTE — Progress Notes (Signed)
  Subjective:    Patient ID: [name] [name], male    DOB: Jan 06, 1951, 60 y.o.   MRN: [id number]  HPI 01/23/11- COPD, CAD/diastolic dysfunction, OSA, chronic respiratory failure, obesity Hypoventilation, cor pulmonale Hosp since last here- once for "shakes"-they told him wasn't using CPAP right, once for epistaxis, once to place urinary stent, and has had a right ? renal biopsy. Told anemic.  Breathing stable, but can't lie flat- feels smothered even on O2.  Runs O2 2-3L/M usually. I gave permision to go to 4 during exertion. Notes "tiresome" feeling midchest, related to exertion/ climbing stairs and relieved by rest. Told to pace himself and not try to push through that.  Discussed need for O2. He doesn't sleep well with his BiPAP 15/12. Discussed sleep hygiene- discussed room temperature. He was more comfortable with autotitration in hosp and we discussed a trial of that at home.  Has restarted diuretic since leg edema has come back.   Review of Systems Constitutional:   No weight loss, night sweats,  Fevers, chills, fatigue, lassitude. HEENT:   No headaches,  Difficulty swallowing,  Tooth/dental problems,  Sore throat,                No sneezing, itching, ear ache, nasal congestion, post nasal drip,   CV:  No- chest pain, but "tired feeling", orthopnea, PND, , anasarca, dizziness, palpitations  GI  No heartburn, indigestion, abdominal pain, nausea, vomiting, diarrhea, change in bowel habits, loss of appetite  Resp: No shortness of breath with exertion or at rest.  No excess mucus, no productive cough,  No non-productive cough,  No coughing up of blood.  No change in color of mucus.  No wheezing.  No chest wall deformity  Skin: no rash or lesions.  GU: no dysuria, change in color of urine, no urgency or frequency.  No flank pain.  MS:  No joint pain or swelling.  No decreased range of motion.  No back pain.  Psych:  No change in mood or affect. No depression or anxiety.  No memory  loss.      Objective:   Physical Exam General- Alert, Oriented, Affect-appropriate, Distress- none acute     O2 3L/M    obese  Skin- rash-none, lesions- none, excoriation- none  Lymphadenopathy- none  Head- atraumatic  Eyes- Gross vision intact, PERRLA, conjunctivae clear secretions  Ears- Hearing, canals, Tm - normal  Nose- Clear, No- Septal dev, mucus, polyps, erosion, perforation   Throat- Mallampati II , mucosa clear , drainage- none, tonsils- atrophic  Neck- flexible , trachea midline, no stridor , thyroid nl, carotid no bruit  Chest - symmetrical excursion , unlabored     Heart/CV- RRR , no murmur , no gallop  , no rub, nl s1 s2                     - JVD- none , Leg edema- 1-2+, stasis changes- none, varices- none     Lung- clear to P&A, wheeze- none, cough- none , dullness-none, rub- none     Chest wall- no pacemaker  Abd- tender-no, distended-no, bowel sounds-present, HSM- no  Br/ Gen/ Rectal- Not done, not indicated  Extrem- cyanosis- none, clubbing, none, atrophy- none, strength- nl  Neuro- grossly intact to observation         Assessment & Plan:

## 2011-01-24 ENCOUNTER — Other Ambulatory Visit: Payer: Self-pay | Admitting: Nephrology

## 2011-01-24 ENCOUNTER — Encounter (HOSPITAL_COMMUNITY): Payer: Medicare Other | Attending: Nephrology

## 2011-01-24 DIAGNOSIS — D638 Anemia in other chronic diseases classified elsewhere: Secondary | ICD-10-CM | POA: Insufficient documentation

## 2011-01-24 DIAGNOSIS — N183 Chronic kidney disease, stage 3 unspecified: Secondary | ICD-10-CM | POA: Insufficient documentation

## 2011-01-26 ENCOUNTER — Encounter: Payer: Self-pay | Admitting: Internal Medicine

## 2011-01-26 NOTE — Assessment & Plan Note (Signed)
Discussed nature of his pulmonary limitation, uses for oxygen, and reviewed meds.

## 2011-01-26 NOTE — Assessment & Plan Note (Signed)
Abdominal crowding is apparent on exam. Weight loss, avoidance of sedation,, and good posture to avoid slumping were educated.

## 2011-02-02 ENCOUNTER — Telehealth: Payer: Self-pay | Admitting: Internal Medicine

## 2011-02-02 DIAGNOSIS — G4730 Sleep apnea, unspecified: Secondary | ICD-10-CM

## 2011-02-02 NOTE — Telephone Encounter (Signed)
Called and spoke with pt.  Pt states he went to [facility] on Monday and picked up loaner cpap machine to do the 2 week auto cpap. Pt states he is unable to tolerate the pressure on the auto.  States pressure "too much. Has difficulty breathing." Therefore pt states he went back to his "old machine" and is doing better.  Pt is requesting CY's recs.  Pt ok with getting a call back tomorrow.  Please call him on his cell... [phone]

## 2011-02-02 NOTE — Telephone Encounter (Signed)
Per CY-lets have [facility] change autotitration range to 6 to 15. I called patient at given number and was told to call patient on cell number at [phone]-had to leave a message on voicemail that we are changing the range.

## 2011-03-22 ENCOUNTER — Telehealth: Payer: Self-pay | Admitting: Internal Medicine

## 2011-03-22 NOTE — Telephone Encounter (Signed)
Duplicate msg.

## 2011-03-22 NOTE — Telephone Encounter (Signed)
LMTCB

## 2011-03-23 NOTE — Telephone Encounter (Signed)
Per CY-no generic offer Ventolin RX.

## 2011-03-23 NOTE — Telephone Encounter (Signed)
Pt returned call from triage. Call [phone]. [name] [name]

## 2011-03-23 NOTE — Telephone Encounter (Addendum)
Called, spoke with pt.  He states ventolin is cheaper than proair but it is still approx $40-45 / month.  He is wanting to know if there is another inhaler he could use in place of the ventolin that would be cheaper.  CDY, pls advise if there is an alternative inhaler to replace the ventolin so pt can see if it will be cheaper.  Thanks!   Note: Pt is requesting a call back today.

## 2011-03-24 NOTE — Telephone Encounter (Signed)
[email or web address].Jennifer Castillo, CMA  

## 2011-03-27 NOTE — Telephone Encounter (Signed)
LMTCB

## 2011-03-28 NOTE — Telephone Encounter (Signed)
Spoke with patient-Proair is indeed cheaper than Ventolin after speaking with his Insurance Company-he got [facility] [facility] and I have updated our records.

## 2011-04-03 NOTE — Discharge Summary (Signed)
  NAMEMarland Kitchen  ELICEO, GLADU            ACCOUNT NO.:  [id number]  MEDICAL RECORD NO.:  [id number]  LOCATION:  6736                         FACILITY:  [facility]  PHYSICIAN:  Terrial Rhodes, M.D.DATE OF BIRTH:  1951-08-11  DATE OF ADMISSION:  01/06/2011 DATE OF DISCHARGE:  01/07/2011                              DISCHARGE SUMMARY   DISCHARGE DIAGNOSES: 1. 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 Anemia. 3. Diabetes. 4. Hypertension. 5. Obesity. 6. Obstructive sleep apnea. 7. Dysuria. 8. Coronary artery disease. 9. Chronic obstructive pulmonary disease.  HOSPITAL COURSE:  Mr. Craver is a 60 year old African American male with past medical history significant for diabetes, hypertension, coronary artery disease, obesity, obstructive sleep apnea and had 2 episodes of acute kidney injury with his creatinine rising from his baseline 1.3 up to 3.  He had kappa light chains in his urine and was admitted for renal biopsy.  He underwent a renal biopsy on Jan 06, 2011, without any incident and was discharged home the following day.  He tolerated the procedure well.  He was instructed to hold his Plavix for 1 week after the biopsy given his history of a stent placement.  DISCHARGE MEDICATIONS: 1. Nitroglycerin sublingual p.r.n. 2. Alprazolam 0.5 mg q.8 h. p.r.n. 3. Aspirin 81 mg a day.  He was on hold for 2 weeks after the biopsy. 4. Furosemide 80 mg a day. 5. Plavix 75 mg.  He was on hold for 1 week. 6. ProAir inhaler 2 puffs as needed. 7. Vitamin B complex with C daily. 8. Metoprolol 25 mg b.i.d. 9. Metolazone 2.5 mg Monday, Wednesday, and Friday. 10.Flomax 0.4 mg at bedtime. 11.Iron 150 mg twice daily. 12.Imdur 30 mg daily.  He is scheduled to follow up with our office after we received biopsy results in 1-2 weeks.          ______________________________ Terrial Rhodes, M.D.     JC/MEDQ  D:  02/22/2011  T:  02/23/2011  Job:  [id number]  Electronically Signed by  Terrial Rhodes M.D. on 04/03/2011 12:18:43 PM

## 2011-04-11 ENCOUNTER — Encounter (HOSPITAL_COMMUNITY): Payer: Medicare Other

## 2011-05-12 LAB — CBC
MCHC: 31.6
MCHC: 33.2
Platelets: 178
Platelets: 206
RBC: 3.41 — ABNORMAL LOW
RBC: 3.54 — ABNORMAL LOW
RDW: 16.1 — ABNORMAL HIGH
RDW: 16.1 — ABNORMAL HIGH

## 2011-05-12 LAB — URINALYSIS, ROUTINE W REFLEX MICROSCOPIC
Bilirubin Urine: NEGATIVE
Glucose, UA: NEGATIVE
Hgb urine dipstick: NEGATIVE
Protein, ur: NEGATIVE
Urobilinogen, UA: 1

## 2011-05-12 LAB — APTT: aPTT: 25

## 2011-05-12 LAB — URINE MICROSCOPIC-ADD ON

## 2011-05-12 LAB — POCT I-STAT, CHEM 8
HCT: 31 — ABNORMAL LOW
Hemoglobin: 10.5 — ABNORMAL LOW
Potassium: 4.3
Sodium: 139

## 2011-05-12 LAB — LIPID PANEL
Cholesterol: 121
HDL: 25 — ABNORMAL LOW
Total CHOL/HDL Ratio: 4.8
Triglycerides: 97

## 2011-05-12 LAB — COMPREHENSIVE METABOLIC PANEL
ALT: 8
Calcium: 9.3
Creatinine, Ser: 1.28
GFR calc non Af Amer: 58 — ABNORMAL LOW
Glucose, Bld: 144 — ABNORMAL HIGH
Sodium: 142
Total Protein: 7.3

## 2011-05-12 LAB — POCT CARDIAC MARKERS
CKMB, poc: <1 — ABNORMAL LOW
Myoglobin, poc: 109

## 2011-05-12 LAB — PROTIME-INR
INR: 1
Prothrombin Time: 13.4

## 2011-05-12 LAB — CK TOTAL AND CKMB (NOT AT ARMC)
Relative Index: INVALID
Relative Index: INVALID
Total CK: 35
Total CK: 42

## 2011-05-12 LAB — DIFFERENTIAL
Basophils Absolute: 0
Basophils Relative: 1
Neutro Abs: 5
Neutrophils Relative %: 69

## 2011-05-12 LAB — D-DIMER, QUANTITATIVE: D-Dimer, Quant: 1.01 — ABNORMAL HIGH

## 2011-05-16 ENCOUNTER — Emergency Department (HOSPITAL_COMMUNITY)
Admission: EM | Admit: 2011-05-16 | Discharge: 2011-05-16 | Disposition: A | Discharge status: Home / Self Care | Payer: Medicare Other | Attending: Emergency Medicine | Admitting: Emergency Medicine

## 2011-05-16 ENCOUNTER — Emergency Department (HOSPITAL_COMMUNITY): Payer: Medicare Other

## 2011-05-16 DIAGNOSIS — I2510 Atherosclerotic heart disease of native coronary artery without angina pectoris: Secondary | ICD-10-CM | POA: Insufficient documentation

## 2011-05-16 DIAGNOSIS — E119 Type 2 diabetes mellitus without complications: Secondary | ICD-10-CM | POA: Insufficient documentation

## 2011-05-16 DIAGNOSIS — J449 Chronic obstructive pulmonary disease, unspecified: Secondary | ICD-10-CM | POA: Insufficient documentation

## 2011-05-16 DIAGNOSIS — I10 Essential (primary) hypertension: Secondary | ICD-10-CM | POA: Insufficient documentation

## 2011-05-16 DIAGNOSIS — J90 Pleural effusion, not elsewhere classified: Secondary | ICD-10-CM | POA: Insufficient documentation

## 2011-05-16 DIAGNOSIS — I509 Heart failure, unspecified: Secondary | ICD-10-CM | POA: Insufficient documentation

## 2011-05-16 DIAGNOSIS — N289 Disorder of kidney and ureter, unspecified: Secondary | ICD-10-CM | POA: Insufficient documentation

## 2011-05-16 DIAGNOSIS — J4489 Other specified chronic obstructive pulmonary disease: Secondary | ICD-10-CM | POA: Insufficient documentation

## 2011-05-16 DIAGNOSIS — R0602 Shortness of breath: Secondary | ICD-10-CM | POA: Insufficient documentation

## 2011-05-16 DIAGNOSIS — Z951 Presence of aortocoronary bypass graft: Secondary | ICD-10-CM | POA: Insufficient documentation

## 2011-05-16 DIAGNOSIS — D638 Anemia in other chronic diseases classified elsewhere: Secondary | ICD-10-CM | POA: Insufficient documentation

## 2011-05-16 DIAGNOSIS — E785 Hyperlipidemia, unspecified: Secondary | ICD-10-CM | POA: Insufficient documentation

## 2011-05-16 LAB — DIFFERENTIAL
Eosinophils Relative: 2 % (ref 0–5)
Lymphocytes Relative: 25 % (ref 12–46)
Lymphs Abs: 1.1 10*3/uL (ref 0.7–4.0)
Monocytes Absolute: 0.6 10*3/uL (ref 0.1–1.0)
Monocytes Relative: 14 % — ABNORMAL HIGH (ref 3–12)
Neutro Abs: 2.6 10*3/uL (ref 1.7–7.7)

## 2011-05-16 LAB — COMPREHENSIVE METABOLIC PANEL
Albumin: 3.7 g/dL (ref 3.5–5.2)
Alkaline Phosphatase: 72 U/L (ref 39–117)
BUN: 28 mg/dL — ABNORMAL HIGH (ref 6–23)
Creatinine, Ser: 1.65 mg/dL — ABNORMAL HIGH (ref 0.50–1.35)
GFR calc Af Amer: 51 mL/min — ABNORMAL LOW (ref >90)
Glucose, Bld: 152 mg/dL — ABNORMAL HIGH (ref 70–99)
Total Bilirubin: 0.2 mg/dL — ABNORMAL LOW (ref 0.3–1.2)
Total Protein: 7.5 g/dL (ref 6.0–8.3)

## 2011-05-16 LAB — CBC
HCT: 29.8 % — ABNORMAL LOW (ref 39.0–52.0)
Hemoglobin: 8.6 g/dL — ABNORMAL LOW (ref 13.0–17.0)
MCHC: 28.9 g/dL — ABNORMAL LOW (ref 30.0–36.0)
MCV: 93.4 fL (ref 78.0–100.0)
RDW: 13.2 % (ref 11.5–15.5)

## 2011-05-16 LAB — CK TOTAL AND CKMB (NOT AT ARMC)
Relative Index: INVALID (ref 0.0–2.5)
Total CK: 41 U/L (ref 7–232)

## 2011-05-16 LAB — OCCULT BLOOD, POC DEVICE: Fecal Occult Bld: NEGATIVE

## 2011-05-16 LAB — PRO B NATRIURETIC PEPTIDE: Pro B Natriuretic peptide (BNP): 191.9 pg/mL — ABNORMAL HIGH (ref 0–125)

## 2011-05-17 LAB — GLUCOSE, CAPILLARY
Glucose-Capillary: 111 — ABNORMAL HIGH
Glucose-Capillary: 127 — ABNORMAL HIGH
Glucose-Capillary: 132 — ABNORMAL HIGH

## 2011-05-17 LAB — BASIC METABOLIC PANEL
BUN: 26 — ABNORMAL HIGH
CO2: 40 — ABNORMAL HIGH
Calcium: 9.2
Creatinine, Ser: 1.36
GFR calc non Af Amer: 54 — ABNORMAL LOW
Glucose, Bld: 168 — ABNORMAL HIGH

## 2011-05-17 LAB — COMPREHENSIVE METABOLIC PANEL
Albumin: 3.5
BUN: 34 — ABNORMAL HIGH
Calcium: 8.9
Glucose, Bld: 127 — ABNORMAL HIGH
Potassium: 3.8
Total Protein: 7.3

## 2011-05-17 LAB — CBC
HCT: 28.9 — ABNORMAL LOW
Hemoglobin: 9.4 — ABNORMAL LOW
Hemoglobin: 9.6 — ABNORMAL LOW
MCHC: 32.4
MCHC: 32.6
Platelets: 181
Platelets: 195
RDW: 15.5
RDW: 16 — ABNORMAL HIGH

## 2011-05-17 LAB — PROTIME-INR
INR: 1
Prothrombin Time: 13.2

## 2011-05-17 LAB — APTT: aPTT: 29

## 2011-05-22 ENCOUNTER — Encounter (HOSPITAL_COMMUNITY)
Admission: RE | Admit: 2011-05-22 | Discharge: 2011-05-22 | Disposition: A | Discharge status: Home / Self Care | Payer: Medicare Other | Source: Ambulatory Visit | Attending: Nephrology | Admitting: Nephrology

## 2011-05-22 ENCOUNTER — Other Ambulatory Visit: Payer: Self-pay | Admitting: Nephrology

## 2011-05-22 DIAGNOSIS — D638 Anemia in other chronic diseases classified elsewhere: Secondary | ICD-10-CM | POA: Insufficient documentation

## 2011-05-22 DIAGNOSIS — N183 Chronic kidney disease, stage 3 unspecified: Secondary | ICD-10-CM | POA: Insufficient documentation

## 2011-05-25 ENCOUNTER — Ambulatory Visit (INDEPENDENT_AMBULATORY_CARE_PROVIDER_SITE_OTHER): Payer: Medicare Other | Admitting: Internal Medicine

## 2011-05-25 ENCOUNTER — Encounter: Payer: Self-pay | Admitting: Internal Medicine

## 2011-05-25 VITALS — BP 120/76 | HR 90 | Ht 73.0 in | Wt 290.2 lb

## 2011-05-25 DIAGNOSIS — I519 Heart disease, unspecified: Secondary | ICD-10-CM

## 2011-05-25 DIAGNOSIS — J9610 Chronic respiratory failure, unspecified whether with hypoxia or hypercapnia: Secondary | ICD-10-CM

## 2011-05-25 DIAGNOSIS — G4730 Sleep apnea, unspecified: Secondary | ICD-10-CM

## 2011-05-25 NOTE — Patient Instructions (Signed)
Try rinsing your nasal passages with salt water/ saline  Please call as needed

## 2011-05-25 NOTE — Progress Notes (Signed)
Patient ID: [name] [name], male    DOB: August 10, 1951, 60 y.o.   MRN: [id number]  HPI 01/23/11- COPD, CAD/diastolic dysfunction, OSA, chronic respiratory failure, obesity Hypoventilation, cor pulmonale Hosp since last here- once for "shakes"-they told him wasn't using CPAP right, once for epistaxis, once to place urinary stent, and has had a right ? renal biopsy. Told anemic.  Breathing stable, but can't lie flat- feels smothered even on O2.  Runs O2 2-3L/M usually. I gave permision to go to 4 during exertion. Notes "tiresome" feeling midchest, related to exertion/ climbing stairs and relieved by rest. Told to pace himself and not try to push through that.  Discussed need for O2. He doesn't sleep well with his BiPAP 15/12. Discussed sleep hygiene- discussed room temperature. He was more comfortable with autotitration in hosp and we discussed a trial of that at home.  Has restarted diuretic since leg edema has come back.   05/25/11-  COPD, CAD/diastolic dysfunction, OSA, chronic respiratory failure, obesity Hypoventilation, cor pulmonale Went to ER last week- short of breath. Air wasn't satisfying. Was started back on shot for anemia. Feels some better.  Reports sneezing, postnasal drainage and a sense of mucus in his upper chest. Denies fever, sore throat and doesn't think he has a cold.  Review of Systems Constitutional:   No-   weight loss, night sweats, fevers, chills, fatigue, lassitude. HEENT:   No-  headaches, difficulty swallowing, tooth/dental problems, sore throat,       +  sneezing, itching, ear ache, nasal congestion, post nasal drip,  CV:  No-   chest pain, orthopnea, PND,  Much less-swelling in lower extremities, anasarca, dizziness, palpitations Resp: + shortness of breath with exertion or at rest.              No-   productive cough,  No non-productive cough,  No-  coughing up of blood.              No-   change in color of mucus.  No- wheezing.   Skin: No-   rash or  lesions. GI:  No-   heartburn, indigestion, abdominal pain, nausea, vomiting, diarrhea,                 change in bowel habits, loss of appetite GU: No-   dysuria, change in color of urine, no urgency or frequency.  No- flank pain. MS:  No-   joint pain or swelling.  No- decreased range of motion.  No- back pain. Neuro- grossly normal to observation, Or:  Psych:  No- change in mood or affect. No depression or anxiety.  No memory loss.       Objective:   Physical Exam   General- Alert, Oriented, Affect-appropriate, Distress- none acute   Portable pulse O2 3L sat 94% Skin- rash-none, lesions- none, excoriation- none Lymphadenopathy- none Head- atraumatic            Eyes- Gross vision intact, PERRLA, conjunctivae clear secretions            Ears- Hearing, canals- normal            Nose- Clear, No-Septal dev mucus, polyps, erosion, perforation             Throat- Mallampati II , mucosa clear , drainage- none, tonsils- atrophic Neck- flexible , trachea midline, no stridor , thyroid nl, carotid no bruit Chest - symmetrical excursion , unlabored           Heart/CV- RRR ,  no murmur , no gallop  , no rub, nl s1 s2                           - JVD- none , edema- none on today's exam, stasis changes- none, varices- none           Lung- clear to P&A, wheeze- none, cough- none , dullness-none, rub- none           Chest wall-  Abd- tender-no, distended-no, bowel sounds-present, HSM- no Br/ Gen/ Rectal- Not done, not indicated Extrem- cyanosis- none, clubbing, none, atrophy- none, strength- nl Neuro- grossly intact to observation

## 2011-05-26 ENCOUNTER — Encounter (HOSPITAL_COMMUNITY)
Admission: RE | Admit: 2011-05-26 | Discharge: 2011-05-26 | Payer: Medicare Other | Source: Ambulatory Visit | Attending: Nephrology | Admitting: Nephrology

## 2011-05-26 LAB — DIFFERENTIAL
Eosinophils Relative: 2
Lymphocytes Relative: 26
Lymphs Abs: 1.6

## 2011-05-26 LAB — CBC
MCHC: 32.7
MCV: 86.5
Platelets: 170
RBC: 3.83 — ABNORMAL LOW
RDW: 13.9

## 2011-05-26 LAB — COMPREHENSIVE METABOLIC PANEL
AST: 15
CO2: 42 — ABNORMAL HIGH
Calcium: 8.7
Creatinine, Ser: 1.07
GFR calc Af Amer: >60
GFR calc non Af Amer: >60

## 2011-05-26 LAB — POCT CARDIAC MARKERS
CKMB, poc: <1 — ABNORMAL LOW
Operator id: 234501
Troponin i, poc: <0.05

## 2011-05-26 LAB — B-NATRIURETIC PEPTIDE (CONVERTED LAB): Pro B Natriuretic peptide (BNP): <30

## 2011-05-27 NOTE — Assessment & Plan Note (Signed)
Good compliance and control 

## 2011-05-27 NOTE — Assessment & Plan Note (Addendum)
He moves slowly and is dependent on his oxygen, but I don't think much has changed and he copes pretty well. He has had flu shot.

## 2011-05-27 NOTE — Assessment & Plan Note (Signed)
There's been a significant cardiomyopathy component to his dyspnea. This seems to be under better control.

## 2011-06-05 ENCOUNTER — Encounter (HOSPITAL_COMMUNITY): Payer: Medicare Other

## 2011-06-05 ENCOUNTER — Other Ambulatory Visit: Payer: Self-pay | Admitting: Nephrology

## 2011-06-05 ENCOUNTER — Emergency Department (HOSPITAL_COMMUNITY): Payer: Medicare Other

## 2011-06-05 ENCOUNTER — Emergency Department (HOSPITAL_COMMUNITY)
Admission: EM | Admit: 2011-06-05 | Discharge: 2011-06-05 | Disposition: A | Discharge status: Home / Self Care | Payer: Medicare Other | Attending: Emergency Medicine | Admitting: Emergency Medicine

## 2011-06-05 DIAGNOSIS — R0789 Other chest pain: Secondary | ICD-10-CM | POA: Insufficient documentation

## 2011-06-05 DIAGNOSIS — I4510 Unspecified right bundle-branch block: Secondary | ICD-10-CM | POA: Insufficient documentation

## 2011-06-05 DIAGNOSIS — Z951 Presence of aortocoronary bypass graft: Secondary | ICD-10-CM | POA: Insufficient documentation

## 2011-06-05 DIAGNOSIS — I509 Heart failure, unspecified: Secondary | ICD-10-CM | POA: Insufficient documentation

## 2011-06-05 DIAGNOSIS — I2510 Atherosclerotic heart disease of native coronary artery without angina pectoris: Secondary | ICD-10-CM | POA: Insufficient documentation

## 2011-06-05 DIAGNOSIS — E119 Type 2 diabetes mellitus without complications: Secondary | ICD-10-CM | POA: Insufficient documentation

## 2011-06-05 DIAGNOSIS — E785 Hyperlipidemia, unspecified: Secondary | ICD-10-CM | POA: Insufficient documentation

## 2011-06-05 DIAGNOSIS — J4489 Other specified chronic obstructive pulmonary disease: Secondary | ICD-10-CM | POA: Insufficient documentation

## 2011-06-05 DIAGNOSIS — R0989 Other specified symptoms and signs involving the circulatory and respiratory systems: Secondary | ICD-10-CM | POA: Insufficient documentation

## 2011-06-05 DIAGNOSIS — D638 Anemia in other chronic diseases classified elsewhere: Secondary | ICD-10-CM | POA: Insufficient documentation

## 2011-06-05 DIAGNOSIS — R0609 Other forms of dyspnea: Secondary | ICD-10-CM | POA: Insufficient documentation

## 2011-06-05 DIAGNOSIS — J449 Chronic obstructive pulmonary disease, unspecified: Secondary | ICD-10-CM | POA: Insufficient documentation

## 2011-06-05 DIAGNOSIS — N183 Chronic kidney disease, stage 3 unspecified: Secondary | ICD-10-CM | POA: Insufficient documentation

## 2011-06-05 DIAGNOSIS — I10 Essential (primary) hypertension: Secondary | ICD-10-CM | POA: Insufficient documentation

## 2011-06-05 LAB — POCT I-STAT TROPONIN I: Troponin i, poc: 0 ng/mL (ref 0.00–0.08)

## 2011-06-05 LAB — CBC
HCT: 35.7 % — ABNORMAL LOW (ref 39.0–52.0)
Hemoglobin: 10.5 g/dL — ABNORMAL LOW (ref 13.0–17.0)
MCH: 27.9 pg (ref 26.0–34.0)
MCHC: 29.4 g/dL — ABNORMAL LOW (ref 30.0–36.0)

## 2011-06-05 LAB — CK TOTAL AND CKMB (NOT AT ARMC)
CK, MB: 1.8 ng/mL (ref 0.3–4.0)
Relative Index: INVALID (ref 0.0–2.5)
Total CK: 77 U/L (ref 7–232)

## 2011-06-05 LAB — POCT I-STAT, CHEM 8
BUN: 44 mg/dL — ABNORMAL HIGH (ref 6–23)
Chloride: 91 mEq/L — ABNORMAL LOW (ref 96–112)
Creatinine, Ser: 2.2 mg/dL — ABNORMAL HIGH (ref 0.50–1.35)
Glucose, Bld: 115 mg/dL — ABNORMAL HIGH (ref 70–99)
Potassium: 4.4 mEq/L (ref 3.5–5.1)

## 2011-06-08 ENCOUNTER — Inpatient Hospital Stay (HOSPITAL_COMMUNITY)
Admission: EM | Admit: 2011-06-08 | Discharge: 2011-06-12 | DRG: 189 | Disposition: A | Discharge status: Home Health | Payer: Medicare Other | Attending: Internal Medicine | Admitting: Internal Medicine

## 2011-06-08 ENCOUNTER — Emergency Department (HOSPITAL_COMMUNITY): Payer: Medicare Other

## 2011-06-08 DIAGNOSIS — Z79899 Other long term (current) drug therapy: Secondary | ICD-10-CM

## 2011-06-08 DIAGNOSIS — N179 Acute kidney failure, unspecified: Secondary | ICD-10-CM | POA: Diagnosis present

## 2011-06-08 DIAGNOSIS — IMO0002 Reserved for concepts with insufficient information to code with codable children: Secondary | ICD-10-CM

## 2011-06-08 DIAGNOSIS — I798 Other disorders of arteries, arterioles and capillaries in diseases classified elsewhere: Secondary | ICD-10-CM | POA: Diagnosis present

## 2011-06-08 DIAGNOSIS — E669 Obesity, unspecified: Secondary | ICD-10-CM | POA: Diagnosis present

## 2011-06-08 DIAGNOSIS — Z7902 Long term (current) use of antithrombotics/antiplatelets: Secondary | ICD-10-CM

## 2011-06-08 DIAGNOSIS — E1159 Type 2 diabetes mellitus with other circulatory complications: Secondary | ICD-10-CM | POA: Diagnosis present

## 2011-06-08 DIAGNOSIS — E785 Hyperlipidemia, unspecified: Secondary | ICD-10-CM | POA: Diagnosis present

## 2011-06-08 DIAGNOSIS — N183 Chronic kidney disease, stage 3 unspecified: Secondary | ICD-10-CM | POA: Diagnosis present

## 2011-06-08 DIAGNOSIS — Z9861 Coronary angioplasty status: Secondary | ICD-10-CM

## 2011-06-08 DIAGNOSIS — Z951 Presence of aortocoronary bypass graft: Secondary | ICD-10-CM

## 2011-06-08 DIAGNOSIS — I2510 Atherosclerotic heart disease of native coronary artery without angina pectoris: Secondary | ICD-10-CM | POA: Diagnosis present

## 2011-06-08 DIAGNOSIS — D638 Anemia in other chronic diseases classified elsewhere: Secondary | ICD-10-CM | POA: Diagnosis present

## 2011-06-08 DIAGNOSIS — Z87891 Personal history of nicotine dependence: Secondary | ICD-10-CM

## 2011-06-08 DIAGNOSIS — J441 Chronic obstructive pulmonary disease with (acute) exacerbation: Secondary | ICD-10-CM | POA: Diagnosis present

## 2011-06-08 DIAGNOSIS — I509 Heart failure, unspecified: Secondary | ICD-10-CM | POA: Diagnosis present

## 2011-06-08 DIAGNOSIS — E875 Hyperkalemia: Secondary | ICD-10-CM | POA: Diagnosis present

## 2011-06-08 DIAGNOSIS — E874 Mixed disorder of acid-base balance: Secondary | ICD-10-CM | POA: Diagnosis not present

## 2011-06-08 DIAGNOSIS — E1142 Type 2 diabetes mellitus with diabetic polyneuropathy: Secondary | ICD-10-CM | POA: Diagnosis present

## 2011-06-08 DIAGNOSIS — Z7982 Long term (current) use of aspirin: Secondary | ICD-10-CM

## 2011-06-08 DIAGNOSIS — Z9981 Dependence on supplemental oxygen: Secondary | ICD-10-CM

## 2011-06-08 DIAGNOSIS — E7800 Pure hypercholesterolemia, unspecified: Secondary | ICD-10-CM | POA: Diagnosis present

## 2011-06-08 DIAGNOSIS — E1149 Type 2 diabetes mellitus with other diabetic neurological complication: Secondary | ICD-10-CM | POA: Diagnosis present

## 2011-06-08 DIAGNOSIS — M109 Gout, unspecified: Secondary | ICD-10-CM | POA: Diagnosis present

## 2011-06-08 DIAGNOSIS — G9340 Encephalopathy, unspecified: Secondary | ICD-10-CM | POA: Diagnosis present

## 2011-06-08 DIAGNOSIS — I129 Hypertensive chronic kidney disease with stage 1 through stage 4 chronic kidney disease, or unspecified chronic kidney disease: Secondary | ICD-10-CM | POA: Diagnosis present

## 2011-06-08 DIAGNOSIS — J9620 Acute and chronic respiratory failure, unspecified whether with hypoxia or hypercapnia: Principal | ICD-10-CM | POA: Diagnosis present

## 2011-06-08 LAB — CBC
HCT: 31.2 % — ABNORMAL LOW (ref 39.0–52.0)
Hemoglobin: 8.8 g/dL — ABNORMAL LOW (ref 13.0–17.0)
MCV: 96.6 fL (ref 78.0–100.0)
RDW: 14 % (ref 11.5–15.5)
WBC: 4.7 10*3/uL (ref 4.0–10.5)

## 2011-06-08 LAB — POCT I-STAT 3, VENOUS BLOOD GAS (G3P V)
Acid-Base Excess: 11 mmol/L — ABNORMAL HIGH (ref 0.0–2.0)
O2 Saturation: 20 %
TCO2: 47 mmol/L (ref 0–100)
pO2, Ven: 21 mmHg — CL (ref 30.0–45.0)

## 2011-06-08 LAB — POCT I-STAT 3, ART BLOOD GAS (G3+)
Patient temperature: 98.6
pCO2 arterial: 87.5 mmHg (ref 35.0–45.0)
pH, Arterial: 7.274 — ABNORMAL LOW (ref 7.350–7.450)

## 2011-06-08 LAB — COMPREHENSIVE METABOLIC PANEL
ALT: 6 U/L (ref 0–53)
AST: 9 U/L (ref 0–37)
Albumin: 3.3 g/dL — ABNORMAL LOW (ref 3.5–5.2)
CO2: 39 mEq/L — ABNORMAL HIGH (ref 19–32)
Calcium: 8.6 mg/dL (ref 8.4–10.5)
Sodium: 137 mEq/L (ref 135–145)
Total Protein: 7.5 g/dL (ref 6.0–8.3)

## 2011-06-08 LAB — CK TOTAL AND CKMB (NOT AT ARMC): Total CK: 33 U/L (ref 7–232)

## 2011-06-08 LAB — DIFFERENTIAL
Eosinophils Relative: 0 % (ref 0–5)
Lymphocytes Relative: 20 % (ref 12–46)
Lymphs Abs: 0.9 10*3/uL (ref 0.7–4.0)
Monocytes Absolute: 0.6 10*3/uL (ref 0.1–1.0)

## 2011-06-08 LAB — POCT I-STAT TROPONIN I

## 2011-06-09 ENCOUNTER — Inpatient Hospital Stay (HOSPITAL_COMMUNITY): Payer: Medicare Other

## 2011-06-09 DIAGNOSIS — N170 Acute kidney failure with tubular necrosis: Secondary | ICD-10-CM

## 2011-06-09 DIAGNOSIS — I509 Heart failure, unspecified: Secondary | ICD-10-CM

## 2011-06-09 DIAGNOSIS — J9600 Acute respiratory failure, unspecified whether with hypoxia or hypercapnia: Secondary | ICD-10-CM

## 2011-06-09 DIAGNOSIS — J441 Chronic obstructive pulmonary disease with (acute) exacerbation: Secondary | ICD-10-CM

## 2011-06-09 LAB — BLOOD GAS, ARTERIAL
Acid-Base Excess: 10.1 mmol/L — ABNORMAL HIGH (ref 0.0–2.0)
Acid-Base Excess: 12 mmol/L — ABNORMAL HIGH (ref 0.0–2.0)
Acid-Base Excess: 12.1 mmol/L — ABNORMAL HIGH (ref 0.0–2.0)
Bicarbonate: 38.8 mEq/L — ABNORMAL HIGH (ref 20.0–24.0)
Drawn by: 270221
Drawn by: 331761
Inspiratory PAP: 14
RATE: 12 resp/min
RATE: 12 resp/min
TCO2: 40.9 mmol/L (ref 0–100)
TCO2: 41 mmol/L (ref 0–100)
TCO2: 41.3 mmol/L (ref 0–100)
pCO2 arterial: 80.3 mmHg (ref 35.0–45.0)
pCO2 arterial: 82.2 mmHg (ref 35.0–45.0)
pCO2 arterial: 98.2 mmHg (ref 35.0–45.0)
pH, Arterial: 7.211 — ABNORMAL LOW (ref 7.350–7.450)
pH, Arterial: 7.296 — ABNORMAL LOW (ref 7.350–7.450)
pH, Arterial: 7.303 — ABNORMAL LOW (ref 7.350–7.450)
pO2, Arterial: 78.3 mmHg — ABNORMAL LOW (ref 80.0–100.0)
pO2, Arterial: 79.4 mmHg — ABNORMAL LOW (ref 80.0–100.0)
pO2, Arterial: 86.1 mmHg (ref 80.0–100.0)

## 2011-06-09 LAB — CK TOTAL AND CKMB (NOT AT ARMC)
CK, MB: 1.7 ng/mL (ref 0.3–4.0)
CK, MB: 1.6 ng/mL (ref 0.3–4.0)
CK, MB: 1.6 ng/mL (ref 0.3–4.0)
Relative Index: INVALID (ref 0.0–2.5)
Total CK: 32 U/L (ref 7–232)
Total CK: 28 U/L (ref 7–232)
Total CK: 23 U/L (ref 7–232)

## 2011-06-09 LAB — CBC
MCV: 95.2 fL (ref 78.0–100.0)
Platelets: 152 10*3/uL (ref 150–400)
RBC: 2.9 MIL/uL — ABNORMAL LOW (ref 4.22–5.81)
RDW: 14.3 % (ref 11.5–15.5)
WBC: 5.4 10*3/uL (ref 4.0–10.5)

## 2011-06-09 LAB — URINALYSIS, ROUTINE W REFLEX MICROSCOPIC
Glucose, UA: NEGATIVE mg/dL
Ketones, ur: NEGATIVE mg/dL
pH: 5 (ref 5.0–8.0)

## 2011-06-09 LAB — BASIC METABOLIC PANEL
CO2: 38 mEq/L — ABNORMAL HIGH (ref 19–32)
CO2: 38 mEq/L — ABNORMAL HIGH (ref 19–32)
Calcium: 9.3 mg/dL (ref 8.4–10.5)
Chloride: 90 mEq/L — ABNORMAL LOW (ref 96–112)
Creatinine, Ser: 2.95 mg/dL — ABNORMAL HIGH (ref 0.50–1.35)
GFR calc Af Amer: 25 mL/min — ABNORMAL LOW (ref >90)
GFR calc non Af Amer: 27 mL/min — ABNORMAL LOW (ref >90)
Potassium: 4.9 mEq/L (ref 3.5–5.1)
Potassium: 5.2 mEq/L — ABNORMAL HIGH (ref 3.5–5.1)
Sodium: 137 mEq/L (ref 135–145)

## 2011-06-09 LAB — MRSA PCR SCREENING: MRSA by PCR: NEGATIVE

## 2011-06-09 LAB — TROPONIN I
Troponin I: <0.3 ng/mL (ref <0.30)
Troponin I: <0.3 ng/mL (ref <0.30)

## 2011-06-09 LAB — URINE MICROSCOPIC-ADD ON

## 2011-06-09 LAB — PRO B NATRIURETIC PEPTIDE: Pro B Natriuretic peptide (BNP): 371.8 pg/mL — ABNORMAL HIGH (ref 0–125)

## 2011-06-09 LAB — LACTATE DEHYDROGENASE: LDH: 151 U/L (ref 94–250)

## 2011-06-09 LAB — GLUCOSE, CAPILLARY: Glucose-Capillary: 120 mg/dL — ABNORMAL HIGH (ref 70–99)

## 2011-06-10 LAB — TYPE AND SCREEN
Antibody Screen: NEGATIVE
Unit division: 0
Unit division: 0

## 2011-06-10 LAB — URINE CULTURE: Special Requests: NEGATIVE

## 2011-06-10 LAB — VITAMIN B12: Vitamin B-12: 770 pg/mL (ref 211–911)

## 2011-06-10 LAB — IRON AND TIBC
Iron: 49 ug/dL (ref 42–135)
Saturation Ratios: 21 % (ref 20–55)
TIBC: 234 ug/dL (ref 215–435)
UIBC: 185 ug/dL (ref 125–400)

## 2011-06-10 LAB — GLUCOSE, CAPILLARY
Glucose-Capillary: 172 mg/dL — ABNORMAL HIGH (ref 70–99)
Glucose-Capillary: 203 mg/dL — ABNORMAL HIGH (ref 70–99)
Glucose-Capillary: 215 mg/dL — ABNORMAL HIGH (ref 70–99)
Glucose-Capillary: 232 mg/dL — ABNORMAL HIGH (ref 70–99)

## 2011-06-10 LAB — BASIC METABOLIC PANEL
BUN: 73 mg/dL — ABNORMAL HIGH (ref 6–23)
CO2: 38 mEq/L — ABNORMAL HIGH (ref 19–32)
CO2: 39 mEq/L — ABNORMAL HIGH (ref 19–32)
Calcium: 9.3 mg/dL (ref 8.4–10.5)
Chloride: 92 mEq/L — ABNORMAL LOW (ref 96–112)
Creatinine, Ser: 2.01 mg/dL — ABNORMAL HIGH (ref 0.50–1.35)
Glucose, Bld: 212 mg/dL — ABNORMAL HIGH (ref 70–99)
Potassium: 5.7 mEq/L — ABNORMAL HIGH (ref 3.5–5.1)
Sodium: 137 mEq/L (ref 135–145)

## 2011-06-10 LAB — PREALBUMIN: Prealbumin: 16.5 mg/dL — ABNORMAL LOW (ref 17.0–34.0)

## 2011-06-11 LAB — BASIC METABOLIC PANEL
CO2: 41 mEq/L (ref 19–32)
Calcium: 9.1 mg/dL (ref 8.4–10.5)
GFR calc non Af Amer: 36 mL/min — ABNORMAL LOW (ref >90)
Sodium: 140 mEq/L (ref 135–145)

## 2011-06-11 LAB — CBC
MCH: 27 pg (ref 26.0–34.0)
MCHC: 29.7 g/dL — ABNORMAL LOW (ref 30.0–36.0)
Platelets: 148 10*3/uL — ABNORMAL LOW (ref 150–400)
RBC: 3.07 MIL/uL — ABNORMAL LOW (ref 4.22–5.81)

## 2011-06-11 LAB — GLUCOSE, CAPILLARY: Glucose-Capillary: 134 mg/dL — ABNORMAL HIGH (ref 70–99)

## 2011-06-11 NOTE — H&P (Signed)
NAMEMarland [name]  [name], [name] NO.:  [id number]  MEDICAL RECORD NO.:  [id number]  LOCATION:  [facility]                         FACILITY:  [facility]  PHYSICIAN:  Jonny Ruiz, MD    DATE OF BIRTH:  1950-09-09  DATE OF ADMISSION:  06/08/2011 DATE OF DISCHARGE:                             HISTORY & PHYSICAL   PRIMARY CARE PHYSICIAN:  [name], MD  CHIEF COMPLAINT:  Generalized weakness.  HISTORY OF PRESENT ILLNESS:  The patient is a 60 year old male with a history significant for obesity/COPD/sleep apnea, O2 dependent, and BiPAP dependent with poor adherence to therapy, who presents to the emergency department with worsening generalized weakness to the point that he was not able to hold himself up today.  He reports that yesterday he was so weak that he fell on top of a box and did not hit the floor.  Today he was found extremely weak, sitting in a chair, and unable to ambulate.  He was brought to the emergency department where the patient was found to be in respiratory failure with elevated pCO2 on venous blood samples.  The patient was placed on BiPAP and we were contacted for evaluation and management.  The patient is not able to give much of a history due to the state of lethargy that he is in and difficulty verbalizing his complaints due to the BiPAP mask that he has on.  His wife tells me that the patient was here last Monday for generalized weakness as well as 2 weeks ago for the same symptoms and was discharged home.  The patient has not had any chest pain, worsening edema, orthopnea, nocturia, or PND.  He has not had any history of syncope in the last 2 weeks.  The patient notices increase in frequency at night.  There is no history of recent fever or chills or no sick exposure.  PAST MEDICAL HISTORY: 1. COPD, O2 dependent at 3 L/minute nasal cannula, and continuous     BiPAP at home for which he is not adherent.  Last pulmonary     function tests on March 02, 2010 showed severe restrictions within     an FVC of 1.51, mild-to-moderate obstruction with an FEV1 of     0.11/31% and DLCO reduced at 37%.  The patient has been followed by     Dr. Jetty Duhamel at [facility] pulmonary for his COPD.  The patient     also suffers from obstructive sleep apnea. 2. Ischemic heart disease with prior CABG as well as coronary stenting     with last echocardiogram March 26, 2011 showing severe LVH,     ejection fraction 55-60%, and grade 1 diastolic dysfunction. 3. Chronic renal insufficiency with renal biopsy in May 2012 showing     kappa light chains. 4. Anemia of chronic disease with a recent hemoglobin climbing up to     2.7. 5. Severe hypertension. 6. Diabetes mellitus type 2 complicated by peripheral neuropathy and     peripheral vascular disease. 7. Gout. 8. Hypercholesterolemia. 9. Former tobacco abuser, quit in 2000.  SURGICAL HISTORY:  Appendectomy and VATS at [facility].  CURRENT MEDICATIONS: 1. Multivitamin 1 a day. 2.  Albuterol inhaler. 3. Nitroglycerin sublingual. 4. Imdur XR 30 mg once a day. 5. Crestor 20 mg daily. 6. Iron 150 mg b.i.d. 7. Plavix 75 mg once a day. 8. Protonix 40 mg a day. 9. Metformin XR 750 mg a day. 10.Klor-Con 20 mEq p.o. b.i.d. 11.Tamsulosin 0.4 mg once a day. 12.Furosemide 80 mg b.i.d. 13.Colchicine 0.6 mg daily. 14.Valium 5 mg at bedtime. 15.Metoprolol 25 mg b.i.d. 16.Aspirin 81 mg a day.  ALLERGIES: 1. ACE INHIBITORS cause an unknown reaction. 2. OXYCODONE causes hallucinations.  FAMILY HISTORY:  Father had sudden death and sister diabetes type 2.  SOCIAL HISTORY:  The patient is on disability.  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  He is married and lives with his wife and a daughter and her children.  He quit tobacco in 2001.  Prior to that he used to smoke 1 pack per day.  He drinks alcohol rarely.  No illicits.  REVIEW OF SYSTEMS:  CONSTITUTIONAL:  No fever, chills, or sweats.   ENT: No runny nose, sore throat, or earache.  CARDIOVASCULAR:  No chest pain, orthopnea, or PND.  There is edema as well as nocturia.  RESPIRATORY: As in HPI.  GASTROINTESTINAL:  The patient denies abdominal pain, nausea, or vomiting.  He denies tarry stools or blood in the stools. There was a colonoscopy pending earlier this year which was not completed due to ongoing active medical problems.  GENITOURINARY:  The patient has a history of hydronephrosis with stenting in the past. Currently he has no hematuria.  MUSCULOSKELETAL:  No arthralgias or myalgias.  NEUROLOGICAL:  No focal weakness, numbness, or paresthesias.  PHYSICAL EXAMINATION:  VITAL SIGNS:  Temperature 98, pulse 75, respirations 18, blood pressure 101/48. GENERAL APPEARANCE:  The patient is an obese [place] man who sits on the edge of the stretcher with the BiPAP machine on.  He appears somewhat lethargic. SKIN AND MUCOSA:  Warm and diaphoretic.  Mucosa pink, well hydrated. HEENT:  Eyes PERRLA, EOMI, anicteric. NECK:  Supple.  No JVD.  No carotid bruits. HEART:  Regular S1, S2 without gallops, murmurs, or rubs. LUNGS:  Clear to auscultation. ABDOMEN:  Obese with an umbilical hernia.  Soft, nontender without organomegaly or masses palpable. EXTREMITIES:  With 1+ ankle edema bilaterally.  Pulses diminished and surgical scar from prior vascular surgeries noted. NEUROLOGIC:  Lethargic, oriented x3, no focal motor or sensory deficits.  LABORATORY ASSESSMENT AND RADIOLOGY:  Chest x-ray, heart is enlarged. Vascular congestion and probably interstitial edema suggesting CHF. Small bilateral pleural effusions.  Bibasilar atelectasis.  Total CK 33, CK-MB 1.8.  Troponin 0.0.  Comprehensive metabolic panel, sodium 137, potassium 5.8, chloride 91, carbon dioxide 39, glucose 140, BUN 70, creatinine 3.06, calcium 8.6, total protein 7.5, albumin 3.3, AST 9, ALT 6, alk phos 64, total bili 0.2.  WBC is 4.7 hemoglobin 8.8,  hematocrit 31.2, MCV 96.6, platelet count 132.  Hemoccult reportedly positive.  EKG with right bundle-branch block, which is not new, and no ST or T-wave abnormalities.  IMPRESSION:  A 60 year old male with: 1. End-stage chronic obstructive pulmonary disease/sleep apnea,     presenting with respiratory failure. 2. Anemia of chronic disease. 3. End-stage renal disease. 4. Hyperkalemia, iatrogenic (the patient is on K-Dur). 5. Diabetes mellitus. 6. Severe hypertension complicated by left ventricular hypertrophy. 7. Ischemic heart disease with prior coronary artery bypass graft and     stenting, on aspirin and Plavix. 8. Gout, on colchicine. 9. History of hypercholesterolemia.  PLAN: 1. Admit the  patient to step-down unit.  Continue BiPAP with pressures     of 13/10.  Keep O2 sat greater than 90%.  Obtain arterial blood     gases.  Obtain B-natriuretic peptide. 2. For ischemic heart disease, cycle cardiac enzymes x3. 3. For his diabetes, discontinue metformin in view of his renal     insufficiency and follow insulin sliding scale per protocol. 4. Discontinue the following medications: Metformin, potassium, colchicine, and diazepam.  Avoid all narcotics as well as nonsteroidal anti-inflammatory drugs.          ______________________________ Jonny Ruiz, MD     GL/MEDQ  D:  06/08/2011  T:  06/08/2011  Job:  [id number]  cc:   [name], M.D. Clinton D. Maple Hudson, MD, [facility], FACP  Electronically Signed by Jonny Ruiz MD on 06/11/2011 10:02:48 PM

## 2011-06-12 DIAGNOSIS — J9600 Acute respiratory failure, unspecified whether with hypoxia or hypercapnia: Secondary | ICD-10-CM

## 2011-06-12 DIAGNOSIS — J441 Chronic obstructive pulmonary disease with (acute) exacerbation: Secondary | ICD-10-CM

## 2011-06-12 DIAGNOSIS — I509 Heart failure, unspecified: Secondary | ICD-10-CM

## 2011-06-12 LAB — BASIC METABOLIC PANEL
Chloride: 95 mEq/L — ABNORMAL LOW (ref 96–112)
GFR calc Af Amer: 49 mL/min — ABNORMAL LOW (ref >90)
GFR calc non Af Amer: 42 mL/min — ABNORMAL LOW (ref >90)
Potassium: 4.6 mEq/L (ref 3.5–5.1)
Sodium: 142 mEq/L (ref 135–145)

## 2011-06-12 LAB — URINE CULTURE
Culture  Setup Time: 201210280145
Special Requests: NEGATIVE

## 2011-06-12 LAB — GLUCOSE, CAPILLARY
Glucose-Capillary: 115 mg/dL — ABNORMAL HIGH (ref 70–99)
Glucose-Capillary: 228 mg/dL — ABNORMAL HIGH (ref 70–99)

## 2011-06-13 NOTE — Discharge Summary (Signed)
NAMEJOURDYN, [name] [name]            ACCOUNT NO.:  [id number]  MEDICAL RECORD NO.:  [id number]  LOCATION:  3715                         FACILITY:  [facility]  PHYSICIAN:  Peggye Pitt, M.D. DATE OF BIRTH:  02-23-1951  DATE OF ADMISSION:  06/08/2011 DATE OF DISCHARGE:  06/12/2011                              DISCHARGE SUMMARY   PRIMARY CARE PHYSICIAN:  [name], MD  PULMONOLOGIST:  Rennis Chris. Maple Hudson, MD, Tonny Bollman, FACP  NEPHROLOGIST:  Terrial Rhodes, MD  DISCHARGE DIAGNOSES: 1. Weakness, encephalopathy, resolved, secondary to acute on chronic     hypercarbic respiratory failure. 2. Acute on chronic kidney disease, stage III, back to baseline. 3. Coronary artery disease, stable this admission. 4. Anemia of chronic disease. 5. Chronic obstructive pulmonary disease, O2 dependent at home. 6. Hypertension. 7. Type 2 diabetes mellitus. 8. Gout. 9. Hypercholesterolemia.  DISCHARGE MEDICATIONS: 1. Advair 250/50 one puff twice daily. 2. Prednisone to take 30 mg for 2 days, and then decrease by 10 mg     every other day until off. 3. Spiriva 18 mcg daily. 4. Aspirin 81 mg daily. 5. Crestor 20 mg daily. 6. Iron 150 mg twice daily. 7. Imdur 30 mg daily. 8. Metoprolol 12.5 mg twice daily. 9. Multivitamin 1 tablet daily. 10.Nitroglycerin 0.4 mg sublingual every 5 minutes up to 3 doses as     needed for chest pain. 11.Plavix 75 mg daily. 12.Albuterol 2 puffs as needed for shortness of breath. 13.Protonix 40 mg daily. 14.Flomax 0.4 mg daily. 15.Valium 5 mg at bedtime.  DISPOSITION AND FOLLOWUP:  [name] [name] will be discharged home today in stable and improved condition.  He will need to wear his CPAP machine at bedtime.  Plan to schedule followup appointment with Dr. Jetty Duhamel in about a week and with his PCP in 2-3 weeks.  CONSULTATIONS THIS HOSPITALIZATION:  Garnetta Buddy, MD with Nephrology.  IMAGES AND PROCEDURES:  Chest x-ray on June 08, 2011, that  showed bibasilar atelectasis and bilateral effusions.  Repeat chest x-ray on June 09, 2011, that showed no change with mild-to-moderate CHF.  A renal ultrasound on June 09, 2011, that showed no evidence of hydronephrosis involving either kidney with small diffuse cortical thinning involving both kidneys, otherwise normal examination.  HISTORY AND PHYSICAL:  For full details, please refer to dictation on June 08, 2011, by Dr. Jordan Hawks, however, in brief, [name] [name] is a 60 year old African American gentleman with a known history of COPD and sleep apnea.  He was poorly compliant with BiPAP therapy who presented to the emergency department with worsening generalized weakness and confusion.  Because of this, we were asked to admit for further evaluation.  HOSPITAL COURSE BY PROBLEM: 1. Acute on chronic hypercarbic respiratory failure.  I believe this     is multifactorial in a patient with COPD with probable acute     exacerbation, morbid obesity/obesity hypoventilation     syndrome/obstructive sleep apnea with poor compliance with CPAP     use.  He had elevated PCO2 on admission.  It appears his baseline     PCO2 is around 40s.  He will be on a prednisone taper.  I have     added Advair and Spiriva  to his regimen for COPD to decrease     morbidity.  I believe, however, that the main reason for his     admission was noncompliance with CPAP therapy.  He has a CPAP     machine at home.  He just did not like to use it.  He has been     counseled extensively on this matter. 2. Acute on chronic kidney disease, stage III.  He has a history of     membranous glomerulonephropathy per biopsy.  His baseline     creatinine is about 1.7 and went as high as about 2.8.  At the time     of discharge, it is back to baseline at 1.71.  We have decided to     discontinue his Lasix at this time given his acute on chronic     kidney disease. 3. Rest of chronic conditions are stable.  VITALS ON  THE DAY OF DISCHARGE:  Blood pressure 123/69, heart rate 61, respirations 14, temp 98.8, sats 99% on 2 L.     Peggye Pitt, M.D.     EH/MEDQ  D:  06/12/2011  T:  06/12/2011  Job:  [id number]  cc:   [name], M.D. Clinton D. Maple Hudson, MD, FCCP, FACP Terrial Rhodes, M.D.  Electronically Signed by Peggye Pitt M.D. on 06/13/2011 06:25:41 PM

## 2011-06-16 ENCOUNTER — Other Ambulatory Visit (HOSPITAL_COMMUNITY): Payer: Self-pay | Admitting: *Deleted

## 2011-06-19 ENCOUNTER — Encounter (HOSPITAL_COMMUNITY): Payer: Medicare Other

## 2011-06-19 DIAGNOSIS — N183 Chronic kidney disease, stage 3 unspecified: Secondary | ICD-10-CM | POA: Insufficient documentation

## 2011-06-19 DIAGNOSIS — D638 Anemia in other chronic diseases classified elsewhere: Secondary | ICD-10-CM | POA: Insufficient documentation

## 2011-06-19 MED ORDER — DARBEPOETIN ALFA-POLYSORBATE 500 MCG/ML IJ SOLN
40.0000 ug | INTRAMUSCULAR | Status: DC
  Administered 2011-06-19: 40 ug via SUBCUTANEOUS

## 2011-06-21 ENCOUNTER — Telehealth: Payer: Self-pay | Admitting: Internal Medicine

## 2011-06-21 NOTE — Telephone Encounter (Signed)
Pt released from the hospital on 06/12/11 and was told to call for HFU with CDY in a few weeks. Pls advise where pt can be worked in.

## 2011-06-21 NOTE — Telephone Encounter (Signed)
[id number] x1 

## 2011-06-22 MED FILL — Darbepoetin Alfa-Polysorbate 80 Soln Inj 40 MCG/0.4ML: INTRAMUSCULAR | Qty: 0.4 | Status: AC

## 2011-06-22 NOTE — Telephone Encounter (Signed)
Pt aware and appointment scheduled. 

## 2011-06-22 NOTE — Telephone Encounter (Signed)
Use 06-28-2011 at 11 am for post hospital with CY.

## 2011-06-28 ENCOUNTER — Encounter: Payer: Self-pay | Admitting: Internal Medicine

## 2011-06-28 ENCOUNTER — Ambulatory Visit (INDEPENDENT_AMBULATORY_CARE_PROVIDER_SITE_OTHER): Payer: Medicare Other | Admitting: Internal Medicine

## 2011-06-28 VITALS — BP 128/82 | HR 78 | Ht 73.0 in | Wt 298.6 lb

## 2011-06-28 DIAGNOSIS — J9610 Chronic respiratory failure, unspecified whether with hypoxia or hypercapnia: Secondary | ICD-10-CM

## 2011-06-28 DIAGNOSIS — G4730 Sleep apnea, unspecified: Secondary | ICD-10-CM

## 2011-06-28 DIAGNOSIS — E678 Other specified hyperalimentation: Secondary | ICD-10-CM

## 2011-06-28 NOTE — Patient Instructions (Addendum)
Suggest you take 1 furosemide daily as needed, to keep fluid build-up from getting too bad. When you see your kidney doctor he can advise you.   [facility]- change BiPAP to AutoPAP 14-18 for trial, continuing O2 at 2L for sleep

## 2011-06-28 NOTE — Progress Notes (Signed)
Patient ID: [name] [name], male    DOB: [birth year], 60 y.o.   MRN: [id number]  HPI 01/23/11- COPD, CAD/diastolic dysfunction, OSA, chronic respiratory failure, obesity Hypoventilation, cor pulmonale Hosp since last here- once for "shakes"-they told him wasn't using CPAP right, once for epistaxis, once to place urinary stent, and has had a right ? renal biopsy. Told anemic.  Breathing stable, but can't lie flat- feels smothered even on O2.  Runs O2 2-3L/M usually. I gave permision to go to 4 during exertion. Notes "tiresome" feeling midchest, related to exertion/ climbing stairs and relieved by rest. Told to pace himself and not try to push through that.  Discussed need for O2. He doesn't sleep well with his BiPAP 15/12. Discussed sleep hygiene- discussed room temperature. He was more comfortable with autotitration in hosp and we discussed a trial of that at home.  Has restarted diuretic since leg edema has come back.   05/25/11-  COPD, CAD/diastolic dysfunction, OSA, chronic respiratory failure, obesity Hypoventilation, cor pulmonale Went to ER last week- short of breath. Air wasn't satisfying. Was started back on shot for anemia. Feels some better.  Reports sneezing, postnasal drainage and a sense of mucus in his upper chest. Denies fever, sore throat and doesn't think he has a cold.  06/28/11-  COPD, CAD/diastolic dysfunction, OSA, chronic respiratory failure, obesity Hypoventilation, cor pulmonale Recently hospitalized October 25-29, notes reviewed with him and x-ray images reviewed by me. He was hospitalized for hypercapnic respiratory failure with transient encephalopathy and renal insufficiency. Since discharge he has regained some ankle edema while off of furosemide. He has a nephrology appointment later this month. He is concerned that he was taken off his diabetes medicines and is directed to discuss this with his primary physician immediately. He has some persistent soreness across his  anterior chest wall at the level of the xiphoid but otherwise breathing better with less exertional dyspnea and before he went in the hospital. He remains dependent on continuous oxygen at 2 L. He continues his BiPAP machine all night, every night but says he was sleeping better with it  on autotitration with a lower pressure used in the hospital.   Review of Systems Constitutional:   No-   weight loss, night sweats, fevers, chills, fatigue, lassitude. HEENT:   No-  headaches, difficulty swallowing, tooth/dental problems, sore throat,       +  sneezing, itching, ear ache, nasal congestion, post nasal drip,  CV:  No-   chest pain, orthopnea, PND,  Much less-swelling in lower extremities, anasarca, dizziness, palpitations Resp: + shortness of breath with exertion or at rest.              No-   productive cough,  No non-productive cough,  No-  coughing up of blood.              No-   change in color of mucus.  No- wheezing.   Skin: No-   rash or lesions. GI:  No-   heartburn, indigestion, abdominal pain, nausea, vomiting, diarrhea,                 change in bowel habits, loss of appetite GU: No-   dysuria, change in color of urine, no urgency or frequency.  No- flank pain. MS:  No-   joint pain or swelling.  No- decreased range of motion.  No- back pain. Neuro- grossly normal to observation, Or:  Psych:  No- change in mood or affect. No  depression or anxiety.  No memory loss.       Objective:   Physical Exam   General- Alert, Oriented, Affect-appropriate, Distress- none acute;  Obese,   Portable pulse O2 2.5L, sat 92% Skin- rash-none, lesions- none, excoriation- none Lymphadenopathy- none Head- atraumatic            Eyes- Gross vision intact, PERRLA, conjunctivae clear secretions            Ears- Hearing, canals- normal            Nose- Clear, No-Septal dev mucus, polyps, erosion, perforation             Throat- Mallampati II , mucosa clear , drainage- none, tonsils- atrophic Neck-  flexible , trachea midline, no stridor , thyroid nl, carotid no bruit Chest - symmetrical excursion , unlabored           Heart/CV- RRR , 1/6 systolic murmur , no gallop  , no rub, nl s1 s2                           - JVD- none , edema- none on today's exam, stasis changes- none,  varices- none           Lung- clear to P&A, wheeze- none, cough- none , dullness-none, rub- none           Chest wall-  Abd- tender-no, distended-no, bowel sounds-present, HSM- no Br/ Gen/ Rectal- Not done, not indicated Extrem- cyanosis- none, clubbing, none, atrophy- none, strength- nl Neuro- grossly intact to observation

## 2011-07-01 NOTE — Assessment & Plan Note (Signed)
His primary variable is heart failure and fluid overload rather than bronchospasm or secretions. He will need to followup with his cardiologist and his nephrologist about the fluid control. I suggested he use his furosemide only as needed for worsening ankle edema until he can get back with them.

## 2011-07-01 NOTE — Assessment & Plan Note (Signed)
He is realistically not able to exercise but can try to be more careful with his diet. Weight loss would decompress his diaphragm and help his breathing.

## 2011-07-01 NOTE — Assessment & Plan Note (Signed)
We're treating chronic respiratory failure as much as sleep apnea. For comfort, based on his experience we will ask [facility] to change him to AutoPap and see how he feels.

## 2011-07-03 ENCOUNTER — Encounter (HOSPITAL_COMMUNITY)
Admission: RE | Admit: 2011-07-03 | Discharge: 2011-07-03 | Disposition: A | Discharge status: Home / Self Care | Payer: Medicare Other | Source: Ambulatory Visit | Attending: Nephrology | Admitting: Nephrology

## 2011-07-03 LAB — FERRITIN: Ferritin: 348 ng/mL — ABNORMAL HIGH (ref 22–322)

## 2011-07-03 LAB — POCT HEMOGLOBIN-HEMACUE: Hemoglobin: 8.8 g/dL — ABNORMAL LOW (ref 13.0–17.0)

## 2011-07-03 MED ORDER — DARBEPOETIN ALFA-POLYSORBATE 40 MCG/0.4ML IJ SOLN
INTRAMUSCULAR | Status: AC
  Administered 2011-07-03: 40 ug via SUBCUTANEOUS
  Filled 2011-07-03: qty 0.4

## 2011-07-03 MED ORDER — DARBEPOETIN ALFA-POLYSORBATE 500 MCG/ML IJ SOLN
40.0000 ug | INTRAMUSCULAR | Status: DC
  Filled 2011-07-03: qty 1

## 2011-07-11 ENCOUNTER — Other Ambulatory Visit (HOSPITAL_COMMUNITY): Payer: Self-pay | Admitting: *Deleted

## 2011-07-13 ENCOUNTER — Inpatient Hospital Stay (HOSPITAL_COMMUNITY)
Admission: EM | Admit: 2011-07-13 | Discharge: 2011-07-22 | DRG: 291 | Disposition: A | Discharge status: Home Health | Payer: Medicare Other | Attending: Cardiovascular Disease | Admitting: Cardiovascular Disease

## 2011-07-13 ENCOUNTER — Emergency Department (HOSPITAL_COMMUNITY): Payer: Medicare Other

## 2011-07-13 ENCOUNTER — Encounter (HOSPITAL_COMMUNITY): Payer: Self-pay | Admitting: Emergency Medicine

## 2011-07-13 ENCOUNTER — Other Ambulatory Visit: Payer: Self-pay

## 2011-07-13 DIAGNOSIS — I119 Hypertensive heart disease without heart failure: Secondary | ICD-10-CM | POA: Diagnosis present

## 2011-07-13 DIAGNOSIS — D649 Anemia, unspecified: Secondary | ICD-10-CM | POA: Diagnosis present

## 2011-07-13 DIAGNOSIS — J449 Chronic obstructive pulmonary disease, unspecified: Secondary | ICD-10-CM | POA: Diagnosis present

## 2011-07-13 DIAGNOSIS — I509 Heart failure, unspecified: Secondary | ICD-10-CM | POA: Diagnosis present

## 2011-07-13 DIAGNOSIS — I2510 Atherosclerotic heart disease of native coronary artery without angina pectoris: Secondary | ICD-10-CM | POA: Diagnosis present

## 2011-07-13 DIAGNOSIS — E662 Morbid (severe) obesity with alveolar hypoventilation: Secondary | ICD-10-CM | POA: Diagnosis present

## 2011-07-13 DIAGNOSIS — E1159 Type 2 diabetes mellitus with other circulatory complications: Secondary | ICD-10-CM

## 2011-07-13 DIAGNOSIS — J4489 Other specified chronic obstructive pulmonary disease: Secondary | ICD-10-CM | POA: Diagnosis present

## 2011-07-13 DIAGNOSIS — I5033 Acute on chronic diastolic (congestive) heart failure: Principal | ICD-10-CM | POA: Diagnosis present

## 2011-07-13 DIAGNOSIS — I279 Pulmonary heart disease, unspecified: Secondary | ICD-10-CM | POA: Diagnosis present

## 2011-07-13 DIAGNOSIS — N183 Chronic kidney disease, stage 3 unspecified: Secondary | ICD-10-CM | POA: Diagnosis present

## 2011-07-13 DIAGNOSIS — Z951 Presence of aortocoronary bypass graft: Secondary | ICD-10-CM

## 2011-07-13 DIAGNOSIS — I739 Peripheral vascular disease, unspecified: Secondary | ICD-10-CM

## 2011-07-13 DIAGNOSIS — I129 Hypertensive chronic kidney disease with stage 1 through stage 4 chronic kidney disease, or unspecified chronic kidney disease: Secondary | ICD-10-CM | POA: Diagnosis present

## 2011-07-13 DIAGNOSIS — J9611 Chronic respiratory failure with hypoxia: Secondary | ICD-10-CM | POA: Diagnosis present

## 2011-07-13 DIAGNOSIS — E119 Type 2 diabetes mellitus without complications: Secondary | ICD-10-CM | POA: Diagnosis present

## 2011-07-13 DIAGNOSIS — E872 Acidosis, unspecified: Secondary | ICD-10-CM | POA: Diagnosis present

## 2011-07-13 DIAGNOSIS — G4733 Obstructive sleep apnea (adult) (pediatric): Secondary | ICD-10-CM | POA: Diagnosis present

## 2011-07-13 DIAGNOSIS — N189 Chronic kidney disease, unspecified: Secondary | ICD-10-CM

## 2011-07-13 DIAGNOSIS — J9610 Chronic respiratory failure, unspecified whether with hypoxia or hypercapnia: Secondary | ICD-10-CM

## 2011-07-13 DIAGNOSIS — I5032 Chronic diastolic (congestive) heart failure: Secondary | ICD-10-CM | POA: Diagnosis present

## 2011-07-13 DIAGNOSIS — J9620 Acute and chronic respiratory failure, unspecified whether with hypoxia or hypercapnia: Secondary | ICD-10-CM | POA: Diagnosis present

## 2011-07-13 DIAGNOSIS — E875 Hyperkalemia: Secondary | ICD-10-CM | POA: Diagnosis present

## 2011-07-13 DIAGNOSIS — J810 Acute pulmonary edema: Secondary | ICD-10-CM

## 2011-07-13 HISTORY — DX: Unspecified osteoarthritis, unspecified site: M19.90

## 2011-07-13 HISTORY — DX: Anemia, unspecified: D64.9

## 2011-07-13 HISTORY — DX: Type 2 diabetes mellitus with other circulatory complications: E11.59

## 2011-07-13 HISTORY — DX: Anxiety disorder, unspecified: F41.9

## 2011-07-13 HISTORY — DX: Essential (primary) hypertension: I10

## 2011-07-13 HISTORY — DX: Peripheral vascular disease, unspecified: I73.9

## 2011-07-13 HISTORY — DX: Angina pectoris, unspecified: I20.9

## 2011-07-13 HISTORY — DX: Heart failure, unspecified: I50.9

## 2011-07-13 HISTORY — DX: Chronic kidney disease, unspecified: N18.9

## 2011-07-13 LAB — POCT I-STAT 3, ART BLOOD GAS (G3+)
Acid-Base Excess: 20 mmol/L — ABNORMAL HIGH (ref 0.0–2.0)
Bicarbonate: 49.9 mEq/L — ABNORMAL HIGH (ref 20.0–24.0)

## 2011-07-13 LAB — DIFFERENTIAL
Basophils Absolute: 0 10*3/uL (ref 0.0–0.1)
Basophils Absolute: 0 10*3/uL (ref 0.0–0.1)
Basophils Relative: 0 % (ref 0–1)
Eosinophils Absolute: 0 10*3/uL (ref 0.0–0.7)
Eosinophils Relative: 0 % (ref 0–5)
Lymphocytes Relative: 11 % — ABNORMAL LOW (ref 12–46)
Lymphocytes Relative: 5 % — ABNORMAL LOW (ref 12–46)
Lymphs Abs: 0.4 10*3/uL — ABNORMAL LOW (ref 0.7–4.0)
Monocytes Absolute: 0.1 10*3/uL (ref 0.1–1.0)
Monocytes Relative: 2 % — ABNORMAL LOW (ref 3–12)
Neutro Abs: 6.3 10*3/uL (ref 1.7–7.7)
Neutro Abs: 7.8 10*3/uL — ABNORMAL HIGH (ref 1.7–7.7)
Neutrophils Relative %: 77 % (ref 43–77)
Neutrophils Relative %: 93 % — ABNORMAL HIGH (ref 43–77)

## 2011-07-13 LAB — HEMOGLOBIN A1C
Hgb A1c MFr Bld: 5.7 % — ABNORMAL HIGH (ref <5.7)
Mean Plasma Glucose: 117 mg/dL — ABNORMAL HIGH (ref <117)

## 2011-07-13 LAB — COMPREHENSIVE METABOLIC PANEL
ALT: 10 U/L (ref 0–53)
AST: 16 U/L (ref 0–37)
Albumin: 3.5 g/dL (ref 3.5–5.2)
Albumin: 3.4 g/dL — ABNORMAL LOW (ref 3.5–5.2)
Alkaline Phosphatase: 80 U/L (ref 39–117)
Alkaline Phosphatase: 91 U/L (ref 39–117)
BUN: 34 mg/dL — ABNORMAL HIGH (ref 6–23)
BUN: 35 mg/dL — ABNORMAL HIGH (ref 6–23)
CO2: >45 mEq/L (ref 19–32)
Calcium: 9.3 mg/dL (ref 8.4–10.5)
Chloride: 99 mEq/L (ref 96–112)
Chloride: 97 mEq/L (ref 96–112)
Creatinine, Ser: 1.55 mg/dL — ABNORMAL HIGH (ref 0.50–1.35)
GFR calc Af Amer: 55 mL/min — ABNORMAL LOW (ref >90)
GFR calc non Af Amer: 47 mL/min — ABNORMAL LOW (ref >90)
Glucose, Bld: 159 mg/dL — ABNORMAL HIGH (ref 70–99)
Potassium: 5.2 mEq/L — ABNORMAL HIGH (ref 3.5–5.1)
Potassium: 5.3 mEq/L — ABNORMAL HIGH (ref 3.5–5.1)
Sodium: 150 mEq/L — ABNORMAL HIGH (ref 135–145)
Total Bilirubin: 0.2 mg/dL — ABNORMAL LOW (ref 0.3–1.2)
Total Bilirubin: 0.3 mg/dL (ref 0.3–1.2)
Total Protein: 8.5 g/dL — ABNORMAL HIGH (ref 6.0–8.3)

## 2011-07-13 LAB — CBC
HCT: 35.7 % — ABNORMAL LOW (ref 39.0–52.0)
Hemoglobin: 9.5 g/dL — ABNORMAL LOW (ref 13.0–17.0)
MCH: 27.3 pg (ref 26.0–34.0)
MCHC: 26.6 g/dL — ABNORMAL LOW (ref 30.0–36.0)
MCV: 102.6 fL — ABNORMAL HIGH (ref 78.0–100.0)
Platelets: 200 10*3/uL (ref 150–400)
Platelets: 189 10*3/uL (ref 150–400)
RBC: 3.48 MIL/uL — ABNORMAL LOW (ref 4.22–5.81)
RDW: 13.7 % (ref 11.5–15.5)
RDW: 13.7 % (ref 11.5–15.5)
WBC: 8.2 10*3/uL (ref 4.0–10.5)
WBC: 8.4 10*3/uL (ref 4.0–10.5)

## 2011-07-13 LAB — GLUCOSE, CAPILLARY
Glucose-Capillary: 139 mg/dL — ABNORMAL HIGH (ref 70–99)
Glucose-Capillary: 130 mg/dL — ABNORMAL HIGH (ref 70–99)

## 2011-07-13 LAB — CARDIAC PANEL(CRET KIN+CKTOT+MB+TROPI)
CK, MB: 2.7 ng/mL (ref 0.3–4.0)
CK, MB: 2.8 ng/mL (ref 0.3–4.0)
CK, MB: 2.9 ng/mL (ref 0.3–4.0)
Relative Index: INVALID (ref 0.0–2.5)
Relative Index: INVALID (ref 0.0–2.5)
Relative Index: INVALID (ref 0.0–2.5)
Total CK: 67 U/L (ref 7–232)
Total CK: 31 U/L (ref 7–232)
Total CK: 38 U/L (ref 7–232)
Troponin I: <0.3 ng/mL (ref <0.30)
Troponin I: <0.3 ng/mL (ref <0.30)
Troponin I: <0.3 ng/mL (ref <0.30)
Troponin I: <0.3 ng/mL (ref <0.30)

## 2011-07-13 LAB — PROTIME-INR
INR: 1.05 (ref 0.00–1.49)
Prothrombin Time: 13.9 seconds (ref 11.6–15.2)

## 2011-07-13 LAB — MAGNESIUM: Magnesium: 2.3 mg/dL (ref 1.5–2.5)

## 2011-07-13 LAB — APTT: aPTT: 33 seconds (ref 24–37)

## 2011-07-13 LAB — TSH: TSH: 0.082 u[IU]/mL — ABNORMAL LOW (ref 0.350–4.500)

## 2011-07-13 LAB — MRSA PCR SCREENING: MRSA by PCR: NEGATIVE

## 2011-07-13 MED ORDER — ASPIRIN 81 MG PO CHEW: 324.0000 mg | CHEWABLE_TABLET | ORAL | Status: AC

## 2011-07-13 MED ORDER — NITROGLYCERIN 0.4 MG SL SUBL: 0.4000 mg | SUBLINGUAL_TABLET | SUBLINGUAL | Status: DC | PRN

## 2011-07-13 MED ORDER — NITROGLYCERIN IN D5W 200-5 MCG/ML-% IV SOLN
5.0000 ug/min | INTRAVENOUS | Status: DC
  Filled 2011-07-13 (×2): qty 250

## 2011-07-13 MED ORDER — ONDANSETRON HCL 4 MG/2ML IJ SOLN: 4.0000 mg | Freq: Four times a day (QID) | INTRAMUSCULAR | Status: DC | PRN

## 2011-07-13 MED ORDER — ASPIRIN 300 MG RE SUPP: 300.0000 mg | RECTAL | Status: AC

## 2011-07-13 MED ORDER — COLCHICINE 0.6 MG PO TABS
0.6000 mg | ORAL_TABLET | Freq: Every day | ORAL | Status: DC
  Administered 2011-07-13 – 2011-07-22 (×10): 0.6 mg via ORAL
  Filled 2011-07-13 (×10): qty 1

## 2011-07-13 MED ORDER — IPRATROPIUM BROMIDE 0.02 % IN SOLN
0.5000 mg | Freq: Once | RESPIRATORY_TRACT | Status: AC
  Administered 2011-07-13: 0.5 mg via RESPIRATORY_TRACT
  Filled 2011-07-13 (×2): qty 2.5

## 2011-07-13 MED ORDER — TIOTROPIUM BROMIDE MONOHYDRATE 18 MCG IN CAPS
18.0000 ug | ORAL_CAPSULE | Freq: Every day | RESPIRATORY_TRACT | Status: DC
  Administered 2011-07-15 – 2011-07-22 (×7): 18 ug via RESPIRATORY_TRACT
  Filled 2011-07-13: qty 5

## 2011-07-13 MED ORDER — TAMSULOSIN HCL 0.4 MG PO CAPS
0.4000 mg | ORAL_CAPSULE | Freq: Every day | ORAL | Status: DC
Start: 2011-07-13 — End: 2011-07-22
  Administered 2011-07-13 – 2011-07-22 (×10): 0.4 mg via ORAL
  Filled 2011-07-13 (×10): qty 1

## 2011-07-13 MED ORDER — INSULIN ASPART 100 UNIT/ML ~~LOC~~ SOLN
0.0000 [IU] | Freq: Every day | SUBCUTANEOUS | Status: DC
  Administered 2011-07-15: 2 [IU] via SUBCUTANEOUS

## 2011-07-13 MED ORDER — CLOPIDOGREL BISULFATE 75 MG PO TABS
75.0000 mg | ORAL_TABLET | Freq: Every day | ORAL | Status: DC
  Administered 2011-07-14 – 2011-07-21 (×8): 75 mg via ORAL
  Filled 2011-07-13 (×10): qty 1

## 2011-07-13 MED ORDER — ENOXAPARIN SODIUM 40 MG/0.4ML ~~LOC~~ SOLN
40.0000 mg | SUBCUTANEOUS | Status: DC
  Administered 2011-07-13 – 2011-07-21 (×8): 40 mg via SUBCUTANEOUS
  Filled 2011-07-13 (×10): qty 0.4

## 2011-07-13 MED ORDER — THERA M PLUS PO TABS
1.0000 | ORAL_TABLET | Freq: Every day | ORAL | Status: DC
  Administered 2011-07-13 – 2011-07-22 (×10): 1 via ORAL
  Filled 2011-07-13 (×10): qty 1

## 2011-07-13 MED ORDER — ALBUTEROL SULFATE HFA 108 (90 BASE) MCG/ACT IN AERS: 2.0000 | INHALATION_SPRAY | Freq: Four times a day (QID) | RESPIRATORY_TRACT | Status: DC | PRN

## 2011-07-13 MED ORDER — METOPROLOL TARTRATE 12.5 MG HALF TABLET
12.5000 mg | ORAL_TABLET | Freq: Two times a day (BID) | ORAL | Status: DC
  Administered 2011-07-13 – 2011-07-21 (×17): 12.5 mg via ORAL
  Filled 2011-07-13 (×20): qty 1

## 2011-07-13 MED ORDER — FUROSEMIDE 10 MG/ML IJ SOLN
80.0000 mg | Freq: Two times a day (BID) | INTRAMUSCULAR | Status: DC
  Administered 2011-07-13 – 2011-07-17 (×8): 80 mg via INTRAVENOUS
  Filled 2011-07-13 (×9): qty 8

## 2011-07-13 MED ORDER — POLYSACCHARIDE IRON 150 MG PO CAPS
150.0000 mg | ORAL_CAPSULE | Freq: Two times a day (BID) | ORAL | Status: DC
  Administered 2011-07-13 – 2011-07-22 (×18): 150 mg via ORAL
  Filled 2011-07-13 (×20): qty 1

## 2011-07-13 MED ORDER — ASPIRIN EC 81 MG PO TBEC
81.0000 mg | DELAYED_RELEASE_TABLET | Freq: Every day | ORAL | Status: DC
  Administered 2011-07-14 – 2011-07-22 (×9): 81 mg via ORAL
  Filled 2011-07-13 (×9): qty 1

## 2011-07-13 MED ORDER — NITROGLYCERIN IN D5W 200-5 MCG/ML-% IV SOLN
5.0000 ug/min | Freq: Once | INTRAVENOUS | Status: AC
  Administered 2011-07-13: 5 ug/min via INTRAVENOUS
  Filled 2011-07-13: qty 250

## 2011-07-13 MED ORDER — INSULIN ASPART 100 UNIT/ML ~~LOC~~ SOLN
0.0000 [IU] | Freq: Three times a day (TID) | SUBCUTANEOUS | Status: DC
  Administered 2011-07-14 – 2011-07-15 (×3): 2 [IU] via SUBCUTANEOUS
  Administered 2011-07-15: 1 [IU] via SUBCUTANEOUS
  Administered 2011-07-16 – 2011-07-17 (×3): 2 [IU] via SUBCUTANEOUS
  Administered 2011-07-18: 1 [IU] via SUBCUTANEOUS
  Administered 2011-07-18: 3 [IU] via SUBCUTANEOUS
  Administered 2011-07-19: 0 [IU] via SUBCUTANEOUS
  Administered 2011-07-20 (×2): 1 [IU] via SUBCUTANEOUS
  Administered 2011-07-21: 2 [IU] via SUBCUTANEOUS
  Administered 2011-07-22: 1 [IU] via SUBCUTANEOUS
  Filled 2011-07-13 (×2): qty 3

## 2011-07-13 MED ORDER — PANTOPRAZOLE SODIUM 40 MG PO TBEC
40.0000 mg | DELAYED_RELEASE_TABLET | Freq: Every day | ORAL | Status: DC
  Administered 2011-07-14 – 2011-07-20 (×7): 40 mg via ORAL
  Filled 2011-07-13 (×7): qty 1

## 2011-07-13 MED ORDER — ROSUVASTATIN CALCIUM 20 MG PO TABS
20.0000 mg | ORAL_TABLET | Freq: Every day | ORAL | Status: DC
  Administered 2011-07-14 – 2011-07-22 (×9): 20 mg via ORAL
  Filled 2011-07-13 (×9): qty 1

## 2011-07-13 MED ORDER — FUROSEMIDE 10 MG/ML IJ SOLN
80.0000 mg | Freq: Once | INTRAMUSCULAR | Status: AC
  Administered 2011-07-13: 80 mg via INTRAVENOUS
  Filled 2011-07-13: qty 8

## 2011-07-13 MED ORDER — FLUTICASONE-SALMETEROL 250-50 MCG/DOSE IN AEPB: 1.0000 | INHALATION_SPRAY | Freq: Two times a day (BID) | RESPIRATORY_TRACT | Status: DC

## 2011-07-13 MED ORDER — MULTIVITAMINS PO CAPS: 1.0000 | ORAL_CAPSULE | Freq: Every day | ORAL | Status: DC

## 2011-07-13 MED ORDER — SODIUM CHLORIDE 0.9 % IJ SOLN
3.0000 mL | INTRAMUSCULAR | Status: DC | PRN
  Administered 2011-07-22: 3 mL via INTRAVENOUS

## 2011-07-13 MED ORDER — ACETAMINOPHEN 325 MG PO TABS
650.0000 mg | ORAL_TABLET | ORAL | Status: DC | PRN
  Administered 2011-07-18: 650 mg via ORAL
  Filled 2011-07-13: qty 2

## 2011-07-13 MED ORDER — FUROSEMIDE 10 MG/ML IJ SOLN
INTRAMUSCULAR | Status: AC
  Administered 2011-07-13: 80 mg via INTRAVENOUS
  Filled 2011-07-13: qty 8

## 2011-07-13 MED ORDER — METHYLPREDNISOLONE SODIUM SUCC 125 MG IJ SOLR
125.0000 mg | Freq: Once | INTRAMUSCULAR | Status: AC
  Administered 2011-07-13: 125 mg via INTRAVENOUS
  Filled 2011-07-13: qty 2

## 2011-07-13 MED ORDER — ALBUTEROL SULFATE (5 MG/ML) 0.5% IN NEBU
2.5000 mg | INHALATION_SOLUTION | Freq: Once | RESPIRATORY_TRACT | Status: AC
  Administered 2011-07-13: 2.5 mg via RESPIRATORY_TRACT
  Filled 2011-07-13 (×2): qty 0.5

## 2011-07-13 MED ORDER — SODIUM CHLORIDE 0.9 % IV SOLN
Freq: Once | INTRAVENOUS | Status: AC
  Administered 2011-07-13: 09:00:00 via INTRAVENOUS

## 2011-07-13 NOTE — ED Notes (Signed)
Pt;s wife's number is [phone]

## 2011-07-13 NOTE — ED Notes (Signed)
#  14 foley cath placed. Clear yellow urine returned. Patient tolerated well.

## 2011-07-13 NOTE — H&P (Signed)
See complete H & P by [name], PAC.   Patient seen and examined. Agree with assessment and plan as outlined by [name], PAC.  Pt now admitted with dyspnea, fatigue and anxiety. He is s/p prior CABG, with subsequent PCI's as outlined, severe COPD, Chronic renal insufficiency, DM, HTN and PVD.  He presents now with respiratory failure contributed by CHF and COPD.  I have spoken to Dr. Briant Cedar. Cr is stable. As per Dr. Reynolds Bowl, avoid ACE-I and ARB RX. Pt is currently breathing bettter with BiPAP.  Will diurese, admit to 2600.   Lennette Bihari, MD, [facility] 07/13/2011 4:11 PM

## 2011-07-13 NOTE — ED Provider Notes (Signed)
History     CSN: [id number] Arrival date & time: 07/13/2011  7:05 AM   First MD Initiated Contact with Patient 07/13/11 [phone]      Chief Complaint  Patient presents with  . Shortness of Breath    (Consider location/radiation/quality/duration/timing/severity/associated sxs/prior treatment) HPI Comments: Patient here with wife who reports a gradual worsening of patient's usual shortness of breath - she reports that over the night he was very restless - had increased oxygen to 3 liters - has long standing history of same, reports increase in edema in lower extremities.  Denies chest pain at this time, denies leg tenderness  Patient is a 60 y.o. male presenting with shortness of breath. The history is provided by the spouse. No language interpreter was used.  Shortness of Breath  The current episode started 2 days ago. The onset was gradual. The problem occurs frequently. The problem has been gradually worsening. The problem is severe. The symptoms are relieved by nothing. The symptoms are aggravated by a supine position. Associated symptoms include orthopnea, shortness of breath and wheezing. Pertinent negatives include no chest pain, no fever, no sore throat, no stridor and no cough. There was no intake of a foreign body. He was not exposed to toxic fumes. He has not inhaled smoke recently. He has had intermittent steroid use. He has had prior hospitalizations. He has had prior ICU admissions. He has had no prior intubations. He has been less responsive. Urine output has decreased. The last void occurred less than 6 hours ago. There were no sick contacts. Recently, medical care has been given by the PCP. Services received include medications given.    Past Medical History  Diagnosis Date  . Heart disease, unspecified   . Coronary atherosclerosis of unspecified type of vessel, native or graft   . Chronic pulmonary heart disease, unspecified   . Other hyperalimentation   . Chronic respiratory  failure   . Unspecified sleep apnea   . Chronic airway obstruction, not elsewhere classified     Past Surgical History  Procedure Date  . Coronary artery bypass graft   . Appendectomy   . Bilateral vats ablation   . Coronary angioplasty with stent placement     Family History  Problem Relation Age of Onset  . Diabetes Sister     History  Substance Use Topics  . Smoking status: Former Games developer  . Smokeless tobacco: Not on file  . Alcohol Use: Not on file      Review of Systems  Constitutional: Positive for activity change and fatigue. Negative for fever.  HENT: Negative for congestion, sore throat and neck pain.   Eyes: Negative for pain.  Respiratory: Positive for shortness of breath and wheezing. Negative for cough and stridor.   Cardiovascular: Positive for orthopnea and leg swelling. Negative for chest pain and palpitations.  Gastrointestinal: Positive for abdominal pain.  Genitourinary: Negative for dysuria and flank pain.  Musculoskeletal: Negative for back pain.  Skin: Negative for color change.  Neurological: Negative for seizures and headaches.  Psychiatric/Behavioral: Positive for confusion.    Allergies  Amlodipine besy-benazepril hcl and Percocet  Home Medications   Current Outpatient Rx  Name Route Sig Dispense Refill  . ALBUTEROL SULFATE HFA 108 (90 BASE) MCG/ACT IN AERS Inhalation Inhale 2 puffs into the lungs every 6 (six) hours as needed.      . ASPIRIN 81 MG PO TABS Oral Take 81 mg by mouth daily.      Marland Kitchen CLOPIDOGREL BISULFATE 75 MG  PO TABS Oral Take 75 mg by mouth daily.      Marland Kitchen COLCRYS 0.6 MG PO TABS Oral Take 1 tablet by mouth daily.    Marland Kitchen DIAZEPAM 5 MG PO TABS Oral Take 5 mg by mouth at bedtime as needed.      Marland Kitchen FLUTICASONE-SALMETEROL 250-50 MCG/DOSE IN AEPB Inhalation Inhale 1 puff into the lungs every 12 (twelve) hours.      . FUROSEMIDE 80 MG PO TABS Oral Take 80 mg by mouth 2 (two) times daily.      Marland Kitchen HYDROCODONE-ACETAMINOPHEN 5-500 MG PO  TABS Oral Take 1 tablet by mouth Every 4 hours as needed.    . ISOSORBIDE MONONITRATE ER 30 MG PO TB24 Oral Take 45 mg by mouth daily.      Marland Kitchen METOPROLOL TARTRATE 25 MG PO TABS Oral Take 12.5 mg by mouth Twice daily.    . MULTIVITAMINS PO CAPS Oral Take 1 capsule by mouth daily.      Marland Kitchen PANTOPRAZOLE SODIUM 40 MG PO TBEC Oral Take 1 tablet by mouth daily.    Marland Kitchen POLYSACCHARIDE IRON 150 MG PO CAPS Oral Take 150 mg by mouth 2 (two) times daily.      Marland Kitchen POTASSIUM CHLORIDE CRYS CR 20 MEQ PO TBCR Oral Take 20 mEq by mouth 2 (two) times daily.      Marland Kitchen ROSUVASTATIN CALCIUM 20 MG PO TABS Oral Take 20 mg by mouth daily.      Marland Kitchen TAMSULOSIN HCL 0.4 MG PO CAPS Oral Take 1 tablet by mouth daily.    Marland Kitchen METFORMIN HCL ER 750 MG PO TB24 Oral Take 750 mg by mouth daily with breakfast.      . NITROGLYCERIN 0.4 MG SL SUBL Sublingual Place 0.4 mg under the tongue every 5 (five) minutes as needed.        BP 150/79  Pulse 106  Temp(Src) 98.2 F (36.8 C) (Oral)  Resp 32  SpO2 98%  Physical Exam  Nursing note and vitals reviewed. Constitutional: He appears well-developed and well-nourished. He appears lethargic. He has a sickly appearance. He appears ill. Nasal cannula in place.  HENT:  Head: Normocephalic and atraumatic.  Right Ear: External ear normal.  Left Ear: External ear normal.  Mouth/Throat: Oropharynx is clear and moist.  Eyes: Conjunctivae are normal. Pupils are equal, round, and reactive to light. No scleral icterus.  Neck: Normal range of motion. Neck supple. JVD present.  Cardiovascular: Tachycardia present.  PMI is displaced.   Pulmonary/Chest: Tachypnea noted. He is in respiratory distress. He has wheezes in the right lower field and the left lower field. He exhibits no tenderness.  Abdominal: Soft. There is no tenderness.  Musculoskeletal: He exhibits edema. He exhibits no tenderness.  Neurological: He appears lethargic.  Skin: Skin is warm and dry.    ED Course  Procedures (including critical  care time)  Labs Reviewed  CBC - Abnormal; Notable for the following:    RBC 3.59 (*)    Hemoglobin 9.5 (*)    HCT 36.3 (*)    MCV 101.1 (*)    MCHC 26.2 (*)    All other components within normal limits  DIFFERENTIAL - Abnormal; Notable for the following:    Lymphocytes Relative 11 (*)    All other components within normal limits  POCT I-STAT 3, BLOOD GAS (G3+) - Abnormal; Notable for the following:    pCO2 arterial 90.2 (*)    Bicarbonate 49.9 (*)    Acid-Base Excess 20.0 (*)  All other components within normal limits  COMPREHENSIVE METABOLIC PANEL  I-STAT 3, BLOOD GAS (G3+)  CARDIAC PANEL(CRET KIN+CKTOT+MB+TROPI)   Dg Chest Portable 1 View  07/13/2011  *RADIOLOGY REPORT*  Clinical Data: Shortness of breath, weakness  PORTABLE CHEST - 1 VIEW  Comparison: 06/09/2011; 06/08/2011; 06/05/2011  Findings: Unchanged enlarged cardiac silhouette and mediastinal contours post mediastinotomy and CABG.  Pulmonary vasculature is indistinct with cephalization of flow.  There is persistent blunting of the bilateral costophrenic angles, left greater than right.  Grossly unchanged of bilateral perihilar and basilar opacities, left greater than right.  Grossly unchanged bones.  IMPRESSION: 1.  Findings suggestive of worsening pulmonary edema. 2.  Grossly unchanged small bilateral effusions and bibasilar opacities, left greater than right.  Original Report Authenticated By: Waynard Reeds, M.D.    Date: 07/13/2011  Rate: 100  Rhythm: sinus tachycardia  QRS Axis: normal  Intervals: normal  ST/T Wave abnormalities: nonspecific ST/T changes  Conduction Disutrbances:right bundle branch block  Narrative Interpretation: Reivewed by Dr. Estell Harpin  Old EKG Reviewed: unchanged  CRITICAL CARE Performed by: Patrecia Pour.   Total critical care time: 30 minutes  Critical care time was exclusive of separately billable procedures and treating other patients.  Critical care was necessary to treat or  prevent imminent or life-threatening deterioration.  Critical care was time spent personally by me on the following activities: development of treatment plan with patient and/or surrogate as well as nursing, discussions with consultants, evaluation of patient's response to treatment, examination of patient, obtaining history from patient or surrogate, ordering and performing treatments and interventions, ordering and review of laboratory studies, ordering and review of radiographic studies, pulse oximetry and re-evaluation of patient's condition.  CHF Exacerbation CRI COPD   MDM  Patient with interval increase in hypercarbia (90) with baseline at about 80 and worsening pulmonary edema more suggestive of CHF exacerbation than COPD exacerbation.  I have held off on placing the patient on Bi PaP at this time, oxygen saturations stable on 3 liters [place], Have started nitro drip at 68mcg/min and given the patient 80mg  IV lasix.  Patient with h/o CRI not on dialysis.  Results d/w patient's wife and will be admitted by [facility], after speaking with their PA.        Izola Price [place], [place] 07/13/11 [phone]

## 2011-07-13 NOTE — ED Provider Notes (Signed)
Medical screening examination/treatment/procedure(s) were performed by non-physician practitioner and as supervising physician I was immediately available for consultation/collaborat    Pt with sob.  pe lungs few wheezes.  Benny Lennert, MD 07/13/11 [phone]

## 2011-07-13 NOTE — Progress Notes (Signed)
Attempted to take patient off of bipap.  Patient was awake and alert for the entire hour of being on bipap.  Took patient off and he is nodding off to sleep again.  RT will place patient back on.

## 2011-07-13 NOTE — Progress Notes (Signed)
Patient was received in 2600 @ 1810. Patient has CPAP for HS PRN per RRT. Patient is doing well on [place] for now

## 2011-07-13 NOTE — Consult Note (Signed)
PCCM Consult Note:  HISTORY of PRESENT ILLNESS:  [name] [name] is a 60 y.o. 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  Denies fevers, chills, n/v/d, chest pain.  ABG in ER demonstrates ph 7.35 / cO2 90 / pO2 80.  PCCM consulted for OSA / COPD mgmt.    CULTURES 11/29 Sputum>>>  ABX  BEST PRACITICE GI: not indicated DVT: lovenox Nutrition:  NPO for now, advance when mental status more clear   KEY EVENTS / STUDIES    Past Medical History  Diagnosis Date  . Heart disease, unspecified   . Coronary atherosclerosis of unspecified type of vessel, native or graft   . Chronic pulmonary heart disease, unspecified   . Other hyperalimentation   . Chronic respiratory failure   . Unspecified sleep apnea   . Chronic airway obstruction, not elsewhere classified     Past Surgical History  Procedure Date  . Coronary artery bypass graft   . Appendectomy   . Bilateral vats ablation   . Coronary angioplasty with stent placement     Family History  Problem Relation Age of Onset  . Diabetes Sister      reports that he has quit smoking. He does not have any smokeless tobacco history on file. His alcohol and drug histories not on file.  Allergies  Allergen Reactions  . Amlodipine Besy-Benazepril Hcl     REACTION: mouth edema  . Percocet (Oxycodone-Acetaminophen) Other (See Comments)    hallucinations    Medications Prior to Admission  Medication Dose Route Frequency Provider Last Rate Last Dose  . 0.9 %  sodium chloride infusion   Intravenous Once Scarlette Calico C. [place], [place] 10 mL/hr at 07/13/11  9604    . albuterol (PROVENTIL) (5 MG/ML) 0.5% nebulizer solution 2.5 mg  2.5 mg Nebulization Once Scarlette Calico C. [place], PA   2.5 mg at 07/13/11 0744  . darbepoetin alfa-polysorbate (ARANESP) injection 40 mcg  40 mcg Subcutaneous Q14 Days Irena Cords, MD   40 mcg at 06/19/11 1345  . furosemide (LASIX) injection 80 mg  80 mg Intravenous Once Scarlette Calico C. [place], PA   80 mg at 07/13/11 0953  . ipratropium (ATROVENT) nebulizer solution 0.5 mg  0.5 mg Nebulization Once Scarlette Calico C. [place], PA   0.5 mg at 07/13/11 0744  . methylPREDNISolone sodium succinate (SOLU-MEDROL) 125 MG injection 125 mg  125 mg Intravenous Once Scarlette Calico C. [place], [place]   125 mg at 07/13/11 0841  . nitroGLYCERIN 0.2 mg/mL in dextrose 5 % infusion  5 mcg/min Intravenous Once Scarlette Calico C. [place], PA 1.5 mL/hr at 07/13/11 0956 5 mcg/min at 07/13/11 0956   Medications Prior to Admission  Medication Sig Dispense Refill  . albuterol (PROAIR HFA) 108 (90 BASE) MCG/ACT inhaler Inhale 2 puffs into the lungs every 6 (six) hours as needed.        Marland Kitchen aspirin 81 MG tablet Take 81 mg by mouth daily.        . clopidogrel (PLAVIX) 75 MG tablet Take 75 mg by mouth daily.        Marland Kitchen COLCRYS 0.6 MG tablet Take 1 tablet by  mouth daily.      . diazepam (VALIUM) 5 MG tablet Take 5 mg by mouth at bedtime as needed.        . furosemide (LASIX) 80 MG tablet Take 80 mg by mouth 2 (two) times daily.        Marland Kitchen HYDROcodone-acetaminophen (VICODIN) 5-500 MG per tablet Take 1 tablet by mouth Every 4 hours as needed.      . metoprolol tartrate (LOPRESSOR) 25 MG tablet Take 12.5 mg by mouth Twice daily.      . Multiple Vitamin (MULTIVITAMIN) capsule Take 1 capsule by mouth daily.        . pantoprazole (PROTONIX) 40 MG tablet Take 1 tablet by mouth daily.      . potassium chloride SA (K-DUR,KLOR-CON) 20 MEQ tablet Take 20 mEq by mouth 2 (two) times daily.        . rosuvastatin (CRESTOR) 20 MG tablet Take 20 mg by mouth daily.        . Tamsulosin HCl (FLOMAX) 0.4  MG CAPS Take 1 tablet by mouth daily.      . metFORMIN (GLUCOPHAGE-XR) 750 MG 24 hr tablet Take 750 mg by mouth daily with breakfast.        . nitroGLYCERIN (NITROSTAT) 0.4 MG SL tablet Place 0.4 mg under the tongue every 5 (five) minutes as needed.          ROS: Constitutional:   No  weight loss, night sweats,  Fevers, chills, fatigue HEENT:   No headaches,  Difficulty swallowing,  Tooth/dental problems,  Sore throat,                  No sneezing, itching, ear ache, nasal congestion, post nasal drip,   CV:  No chest pain,PND, dizziness, palpitations.  Positive for orthopnea, swelling of lower extremities.   GI:  No heartburn, indigestion, abdominal pain, nausea, vomiting, diarrhea, change in bowel habits, loss of appetite  Resp: Indicates increasing SOB, lower ext swelling.  Sputum production.  Skin: no rash or lesions.  GU: no dysuria, change in color of urine, no urgency or frequency.  No flank pain.  MS:  No joint pain or swelling.  No decreased range of motion.  No back pain.  Psych:  No change in mood or affect. No depression or anxiety.  No memory loss.   Blood pressure 151/76, pulse 106, temperature 98.2 F (36.8 C), temperature source Oral, resp. rate 29, SpO2 100.00%.  PHYICAL EXAM: General:  Obese male, somnolent but arousable Neuro: drowsy, slow to answer questions but appropriate, MAEW CV: s1s2 rrr, 2-3/6 SEM PULM: resp's shallow, non-labored, lungs bilaterally diminished in bases ZO:XWRUE/AVWU, bsx4 active Extremities: warm/dry, significant pitting edema noted in legs bilaterally Derm: no rash or skin breakdown Neck: thick, no masses noted HEENT: PERRL, EOMi, OP clear, NCAT   LABS BMET    Component Value Date/Time   NA 142 06/12/2011 0610   K 4.6 06/12/2011 0610   CL 95* 06/12/2011 0610   CO2 40* 06/12/2011 0610   GLUCOSE 112* 06/12/2011 0610   BUN 66* 06/12/2011 0610   CREATININE 1.71* 06/12/2011 0610   CALCIUM 9.6 06/12/2011 0610   CALCIUM 8.5  12/10/2010 0400   GFRNONAA 42* 06/12/2011 0610   GFRAA 49* 06/12/2011 0610    CBC    Component Value Date/Time   WBC 8.2 07/13/2011 0724   RBC 3.59* 07/13/2011 0724   HGB 9.5* 07/13/2011 0724   HCT 36.3* 07/13/2011 0724   PLT 200 07/13/2011 0724  MCV 101.1* 07/13/2011 0724   MCH 26.5 07/13/2011 0724   MCHC 26.2* 07/13/2011 0724   RDW 13.7 07/13/2011 0724   LYMPHSABS 0.9 07/13/2011 0724   MONOABS 0.9 07/13/2011 0724   EOSABS 0.0 07/13/2011 0724   BASOSABS 0.0 07/13/2011 0724     ABG    Component Value Date/Time   PHART 7.351 07/13/2011 0759   HCO3 49.9* 07/13/2011 0759   TCO2 >50 07/13/2011 0759   O2SAT 94.0 07/13/2011 0759    RADIOLOGIC DATA 11/29 CXR>>>bilateral airspace disease (c/w Pulm edema), small bilateral effusions   ASSESSMENT/PLAN:  Diastolic CHF / HTN -worsening edema over past week prior to admit.   PLAN: -lasix  -check bnp -crestor -asa / plavix -BP control  COPD / OSA / Cor Pulmonale.  Likely decompensated CHF.  No evidence of infection at present.  No evidence of acute bronchospasm, so would focus therapy on CHF and maintain him on home meds. PLAN: -hold advair (prior notes from Dr. Maple Hudson state that this was not effective) -spiriva -keep dry as renal fxn and BP tolerate -now bipap -->baseline CO2 runs in 80's, ok for floor (home bipap settings while asleep) -check sputum -f/u cxr -hold abx for now, follow fever curve -no indication for steroids at present -O2 to keep sats 88-93%  DM PLAN -per primary svc    [name], NP-C [facility] Pgr: [phone]  07/13/2011   I have seen and examined Mr. Worth with Ms. Ollis and agree with her note above which we formulated together.  Max Fickle MD Pager [phone]

## 2011-07-13 NOTE — Progress Notes (Signed)
Admitting BIPAP order in computer for continuous per RT bipap. RT came to see patient and patient was in the hallway about to transport to 4700.  RT made RN aware that a patient needing continuous bipap cannot be admitted to the floor.  RN then proceeded to tell me that the patient had sleep apnea and that she didn't think it was meant to be continuous.  Currently waiting on order clarification from MD. Patient currently awake alert and breathing well and not in need of bipap.  RT will continue to monitor patient.

## 2011-07-13 NOTE — Progress Notes (Signed)
Rt came to place patient on home bipap unit per MD.  Patient was eating his lunch at the time and stated that he did not want to be placed on bipap at the time.  RN notified.  RT will come back to place patient on bipap as soon as he is done eating.

## 2011-07-13 NOTE — ED Notes (Signed)
PT. REPORTS PROGRESSING SOB WITH PRODUSTIVE COUGH FOR 2 DAYS .

## 2011-07-13 NOTE — ED Provider Notes (Signed)
Medical screening examination/treatment/procedure(s) were performed by non-physician practitioner and as supervising physician I was immediately available for consultation/collaboration.   Roshaun Pound L Itzelle Gains, MD 07/13/11 1044 

## 2011-07-13 NOTE — H&P (Signed)
Patient ID: [name] [name] MRN: [id number], DOB/AGE: [birth year]   Admit date: 07/13/2011   Primary Physician: Altamese [place], MD Primary Cardiologist: Dr Allyson Sabal Primary MD: Dr Darcella Cheshire Pulmonologist: Dr C. Young Nephrologist: Dr Abel Presto   Problem List: Past Medical History  Diagnosis Date  . Heart disease, unspecified   . Coronary atherosclerosis of unspecified type of vessel, native or graft   . Chronic pulmonary heart disease, unspecified   . Other hyperalimentation   . Chronic respiratory failure   . Unspecified sleep apnea   . Chronic airway obstruction, not elsewhere classified     Past Surgical History  Procedure Date  . Coronary artery bypass graft   . Appendectomy   . Bilateral vats ablation   . Coronary angioplasty with stent placement      Allergies:  Allergies  Allergen Reactions  . Amlodipine Besy-Benazepril Hcl     REACTION: mouth edema  . Percocet (Oxycodone-Acetaminophen) Other (See Comments)    hallucinations    HPI: [name] [name] is a 60 year old male followed by Dr. Jetty Duhamel, Dr. Francis Dowse: Riley Lam, Dr. Barbara Cower, and Dr. Viann Shove. He has multiple medical problems. He has a primarily lung disease. He is on home O2 at 3 L. He does have coronary disease, he had bypass grafting in 2001x6. He had an intervention and December 2005, July 2010, August 2010, and most recently July 2000 and Remus Loffler. He has normal LV function. An echocardiogram done in April 2012 Sjgren ejection fraction of 55-60% with severe concentric LVH and grade 1 diastolic dysfunction. He has been admitted for respiratory failure in the past which we felt was a combination of his lung disease as well as some diastolic dysfunction. Recently he's been increasingly weak as an outpatient. His family says he's had increasing somnolence the last 48 hours. His diuretics have recently been adjusted by his nephrologist. Apparently they were stopped for a time and then recently restarted this  week. He has also seen Dr. Jetty Duhamel recently in the office. He has not seen Dr. Gery Pray in a few weeks. He has had some chest pain but he describes this as "not bad". Today his family brought him to the emergency room because of increasing shortness of breath and somnolence. In the emergency room his chest x-ray shows congestive heart failure. His BNP was 770. We were asked to see and admit him by the emergency room physician.   Home Medications  (Not in a hospital admission)   Family History  Problem Relation Age of Onset  . Diabetes Sister      History   Social History  . Marital Status: Married    Spouse Name: N/A    Number of Children: N/A  . Years of Education: N/A   Occupational History  . Not on file.   Social History Main Topics  . Smoking status: Former Games developer  . Smokeless tobacco: Not on file  . Alcohol Use: Not on file  . Drug Use: Not on file  . Sexually Active: Not on file   Other Topics Concern  . Not on file   Social History Narrative  . No narrative on file     Review of Systems: General: Increasing weakness and somnolence last 48hs but present for 2 wks .  Cardiovascular: some chest pain off and on "not bad". Chronic dyspnea on exertion, increasing LE edema, Chronic  Orthopnea. No palpitations. Dermatological: negative for rash Respiratory: positive for increasing cough and wheezing Urologic: negative for hematuria Abdominal: negative for  nausea, vomiting, diarrhea, bright red blood per rectum, melena, or hematemesis Neurologic: negative for visual changes, syncope, or dizziness. Recent h/a last week. All other systems reviewed and are otherwise negative except as noted above.  Physical Exam: Blood pressure 130/52, pulse 105, temperature 98.2 F (36.8 C), temperature source Oral, resp. rate 20, SpO2 96.00%.  General: Chronically ill apppearing AA male in mild resp distress.. Head: Normocephalic, atraumatic, sclera non-icteric, no xanthomas,  nares are without discharge.  Neck: Supple without bruits. Positive JVD. Lungs: Decreased breath sounds overall with basilar crackles bilat. Heart: RRR no s3, s4, or murmurs, decreased heart sounds Abdomen: Soft, non-tender, non-distended, BS + x 4.  Msk:  Strength and tone appears normal for age. Extremities: Bilat 1-2+ LE edema. Diminished distal pulses Neuro: Slightly lethargic but responds appropriately.Moves all extremities spontaneously. Psych: Normal affect.   Labs:   Results for orders placed during the hospital encounter of 07/13/11 (from the past 24 hour(s))  CBC     Status: Abnormal   Collection Time   07/13/11  7:24 AM      Component Value Range   WBC 8.2  4.0 - 10.5 (K/uL)   RBC 3.59 (*) 4.22 - 5.81 (MIL/uL)   Hemoglobin 9.5 (*) 13.0 - 17.0 (g/dL)   HCT 16.1 (*) 09.6 - 52.0 (%)   MCV 101.1 (*) 78.0 - 100.0 (fL)   MCH 26.5  26.0 - 34.0 (pg)   MCHC 26.2 (*) 30.0 - 36.0 (g/dL)   RDW 04.5  40.9 - 81.1 (%)   Platelets 200  150 - 400 (K/uL)  DIFFERENTIAL     Status: Abnormal   Collection Time   07/13/11  7:24 AM      Component Value Range   Neutrophils Relative 77  43 - 77 (%)   Neutro Abs 6.3  1.7 - 7.7 (K/uL)   Lymphocytes Relative 11 (*) 12 - 46 (%)   Lymphs Abs 0.9  0.7 - 4.0 (K/uL)   Monocytes Relative 11  3 - 12 (%)   Monocytes Absolute 0.9  0.1 - 1.0 (K/uL)   Eosinophils Relative 0  0 - 5 (%)   Eosinophils Absolute 0.0  0.0 - 0.7 (K/uL)   Basophils Relative 0  0 - 1 (%)   Basophils Absolute 0.0  0.0 - 0.1 (K/uL)  CARDIAC PANEL(CRET KIN+CKTOT+MB+TROPI)     Status: Normal   Collection Time   07/13/11  7:25 AM      Component Value Range   Total CK 67  7 - 232 (U/L)   CK, MB 2.7  0.3 - 4.0 (ng/mL)   Troponin I <0.30  <0.30 (ng/mL)   Relative Index RELATIVE INDEX IS INVALID  0.0 - 2.5   POCT I-STAT 3, BLOOD GAS (G3+)     Status: Abnormal   Collection Time   07/13/11  7:59 AM      Component Value Range   pH, Arterial 7.351  7.350 - 7.450    pCO2 arterial  90.2 (*) 35.0 - 45.0 (mmHg)   pO2, Arterial 80.0  80.0 - 100.0 (mmHg)   Bicarbonate 49.9 (*) 20.0 - 24.0 (mEq/L)   TCO2 >50  0 - 100 (mmol/L)   O2 Saturation 94.0     Acid-Base Excess 20.0 (*) 0.0 - 2.0 (mmol/L)   Collection site RADIAL, ALLEN'S TEST ACCEPTABLE     Drawn by Operator     Sample type ARTERIAL     Comment NOTIFIED PHYSICIAN    PRO B NATRIURETIC PEPTIDE  Status: Abnormal   Collection Time   07/13/11  9:16 AM      Component Value Range   BNP, POC 744.9 (*) 0 - 125 (pg/mL)     Radiology/Studies: Dg Chest Portable 1 View  07/13/2011  *RADIOLOGY REPORT*  Clinical Data: Shortness of breath, weakness  PORTABLE CHEST - 1 VIEW  Comparison: 06/09/2011; 06/08/2011; 06/05/2011  Findings: Unchanged enlarged cardiac silhouette and mediastinal contours post mediastinotomy and CABG.  Pulmonary vasculature is indistinct with cephalization of flow.  There is persistent blunting of the bilateral costophrenic angles, left greater than right.  Grossly unchanged of bilateral perihilar and basilar opacities, left greater than right.  Grossly unchanged bones.  IMPRESSION: 1.  Findings suggestive of worsening pulmonary edema. 2.  Grossly unchanged small bilateral effusions and bibasilar opacities, left greater than right.  Original Report Authenticated By: Waynard Reeds, M.D.    EKG:NSR ST RBBB  ASSESSMENT AND PLAN:   1. Acute on chronic diastolic CHF 2. Respiratory failure secondary to CHF and severe COPD 3. CAD-CABG x6 2001.      OM stent 12/05      SVG-PDA stent 7/10       OM/CFX stent 8/10        Svg-RCA stent 7/11 4. COPD-s/p fibrothorax, decortication 8/05 5. Stage 3 CRI 6.Type 2 DM 7.HTN, nl RA 2009 8.PVD, LSFA PTA 2009, occluded RSFA   Plan-Admit, Renal consult, Pulm consult.    Deland Pretty, PA-C 07/13/2011, 11:35 AM

## 2011-07-14 ENCOUNTER — Inpatient Hospital Stay (HOSPITAL_COMMUNITY): Payer: Medicare Other

## 2011-07-14 ENCOUNTER — Encounter (HOSPITAL_COMMUNITY): Payer: Self-pay | Admitting: Cardiology

## 2011-07-14 DIAGNOSIS — E875 Hyperkalemia: Secondary | ICD-10-CM | POA: Diagnosis not present

## 2011-07-14 DIAGNOSIS — D649 Anemia, unspecified: Secondary | ICD-10-CM | POA: Diagnosis present

## 2011-07-14 LAB — GLUCOSE, CAPILLARY
Glucose-Capillary: 155 mg/dL — ABNORMAL HIGH (ref 70–99)
Glucose-Capillary: 116 mg/dL — ABNORMAL HIGH (ref 70–99)
Glucose-Capillary: 95 mg/dL (ref 70–99)

## 2011-07-14 LAB — BLOOD GAS, ARTERIAL
Acid-Base Excess: 17.3 mmol/L — ABNORMAL HIGH (ref 0.0–2.0)
Bicarbonate: 46.8 mEq/L — ABNORMAL HIGH (ref 20.0–24.0)
Bicarbonate: 46.4 mEq/L — ABNORMAL HIGH (ref 20.0–24.0)
Delivery systems: POSITIVE
Drawn by: 25803
Inspiratory PAP: 10
O2 Saturation: 84.7 %
Patient temperature: 98.6
Patient temperature: 98.6
RATE: 8 resp/min
TCO2: 51.4 mmol/L (ref 0–100)
TCO2: 50.4 mmol/L (ref 0–100)
pCO2 arterial: 118 mmHg (ref 35.0–45.0)
pH, Arterial: 7.231 — ABNORMAL LOW (ref 7.350–7.450)

## 2011-07-14 LAB — CBC
HCT: 33.4 % — ABNORMAL LOW (ref 39.0–52.0)
Hemoglobin: 8.8 g/dL — ABNORMAL LOW (ref 13.0–17.0)
MCH: 27.2 pg (ref 26.0–34.0)
MCHC: 26.3 g/dL — ABNORMAL LOW (ref 30.0–36.0)
MCV: 103.1 fL — ABNORMAL HIGH (ref 78.0–100.0)
Platelets: 198 10*3/uL (ref 150–400)
RBC: 3.24 MIL/uL — ABNORMAL LOW (ref 4.22–5.81)
RDW: 13.8 % (ref 11.5–15.5)
WBC: 6.7 10*3/uL (ref 4.0–10.5)

## 2011-07-14 LAB — BASIC METABOLIC PANEL
BUN: 43 mg/dL — ABNORMAL HIGH (ref 6–23)
CO2: 45 mEq/L (ref 19–32)
CO2: >45 mEq/L (ref 19–32)
Calcium: 9.2 mg/dL (ref 8.4–10.5)
Calcium: 9.4 mg/dL (ref 8.4–10.5)
Chloride: 98 mEq/L (ref 96–112)
Chloride: 100 mEq/L (ref 96–112)
Creatinine, Ser: 1.68 mg/dL — ABNORMAL HIGH (ref 0.50–1.35)
Creatinine, Ser: 1.92 mg/dL — ABNORMAL HIGH (ref 0.50–1.35)
GFR calc Af Amer: 50 mL/min — ABNORMAL LOW (ref >90)
GFR calc non Af Amer: 43 mL/min — ABNORMAL LOW (ref >90)
Glucose, Bld: 133 mg/dL — ABNORMAL HIGH (ref 70–99)
Glucose, Bld: 146 mg/dL — ABNORMAL HIGH (ref 70–99)
Potassium: 6 mEq/L — ABNORMAL HIGH (ref 3.5–5.1)
Sodium: 149 mEq/L — ABNORMAL HIGH (ref 135–145)
Sodium: 151 mEq/L — ABNORMAL HIGH (ref 135–145)

## 2011-07-14 LAB — IRON AND TIBC
Saturation Ratios: 11 % — ABNORMAL LOW (ref 20–55)
TIBC: 239 ug/dL (ref 215–435)
UIBC: 213 ug/dL (ref 125–400)

## 2011-07-14 LAB — RETICULOCYTES: RBC.: 3.37 MIL/uL — ABNORMAL LOW (ref 4.22–5.81)

## 2011-07-14 LAB — CARDIAC PANEL(CRET KIN+CKTOT+MB+TROPI)
CK, MB: 2.4 ng/mL (ref 0.3–4.0)
Relative Index: INVALID (ref 0.0–2.5)
Total CK: 27 U/L (ref 7–232)
Troponin I: <0.3 ng/mL (ref <0.30)

## 2011-07-14 LAB — VITAMIN B12: Vitamin B-12: 1049 pg/mL — ABNORMAL HIGH (ref 211–911)

## 2011-07-14 LAB — FOLATE: Folate: 17.6 ng/mL

## 2011-07-14 NOTE — Progress Notes (Signed)
CRITICAL VALUE ALERT  Critical value received:  CO2 45  Date of notification:  07/14/2011  Time of notification:  5:37 AM  Critical value read back: yes  Nurse who received alert: Birdena Crandall  MD notified (1st page):  Nanetta Batty  Time of first page:  [phone]  MD notified (2nd page):  Time of second page:  Responding MD:  Diona Fanti  Time MD responded:  5:42 AM

## 2011-07-14 NOTE — Progress Notes (Signed)
CRITICAL VALUE ALERT  Critical value received:pH 7.18, pCO2 of 127 Date of notification:  07/14/2011  Time of notification:  1500  Critical value read back:yes  Nurse who received alert:  Jonetta Speak RN  MD notified (1st page):  Dr Craige Cotta  Time of first page:  1500  MD notified (2nd page):  Time of second page:  Responding MD:  Dr. Craige Cotta  Time MD responded:  [phone]

## 2011-07-14 NOTE — Progress Notes (Signed)
CRITICAL VALUE ALERT  Critical value received: PCo2 118  Date of notification:  07/14/2011  Time of notification:  1950  Critical value read back: yes  Nurse who received alert:  Birdena Crandall  MD notified (1st page):  Nada Boozer  Time of first page:  1953  MD notified (2nd page): PCCM  Time of second page: 1958  Responding MD:  Dr. Delton Coombes   Time MD responded:  [phone]

## 2011-07-14 NOTE — Progress Notes (Signed)
Subjective: SOB: somnolent   No complaints   Confused earlier but ok on BIPAP Objective: Vital signs in last 24 hours: Temp:  [96 F (35.6 C)-98 F (36.7 C)] 96 F (35.6 C) (11/30 0854) Pulse Rate:  [88-109] 89  (11/30 0854) Resp:  [14-37] 37  (11/30 0854) BP: (109-152)/(45-98) 109/45 mmHg (11/30 0854) SpO2:  [86 %-100 %] 92 % (11/30 0854) Weight:  [127.3 kg (280 lb 10.3 oz)] 280 lb 10.3 oz (127.3 kg) (11/29 1838) Weight change:  Last BM Date: 07/11/11 Intake/Output from previous day:  -950 11/29 0701 - 11/30 0700 In: 149.5 [P.O.:118; I.V.:11.5; IV Piggyback:20] Out: 1100 [Urine:1100] Intake/Output this shift:    PE: General: Somnolent on BIPAP. HEART:RRR with occ ectopy, no obvious mumur LUNGS:decreased breath sounds, occ crackles, exp. Wheezes. ZOX:WRUE, non tender, +BS AVW:UJWJX edema, with brawny ankles NEURO: Oriented X 3, but keeps falling asleep.  Lab Results:  Basename 07/14/11 0415 07/13/11 1200  WBC 6.7 8.4  HGB 8.8* 9.5*  HCT 33.4* 35.7*  PLT 198 189   BMET  Basename 07/14/11 0415 07/13/11 1200  NA 149* 150*  K 6.0* 5.3*  CL 98 97  CO2 45* >45*  GLUCOSE 133* 159*  BUN 43* 35*  CREATININE 1.68* 1.55*  CALCIUM 9.2 9.3    Basename 07/13/11 2359 07/13/11 1520  TROPONINI <0.30 <0.30    Lab Results  Component Value Date   CHOL  Value: 88        ATP III CLASSIFICATION:  <200     mg/dL   Desirable  914-782  mg/dL   Borderline High  >=956    mg/dL   High        09/27/863   HDL 29* 12/05/2010   LDLCALC  Value: 46        Total Cholesterol/HDL:CHD Risk Coronary Heart Disease Risk Table                     Men   Women  1/2 Average Risk   3.4   3.3  Average Risk       5.0   4.4  2 X Average Risk   9.6   7.1  3 X Average Risk  23.4   11.0        Use the calculated Patient Ratio above and the CHD Risk Table to determine the patient's CHD Risk.        ATP III CLASSIFICATION (LDL):  <100     mg/dL   Optimal  784-696  mg/dL   Near or Above                    Optimal   130-159  mg/dL   Borderline  295-284  mg/dL   High  >132     mg/dL   Very High 4/40/1027   TRIG 63 12/05/2010   CHOLHDL 3.0 12/05/2010   Lab Results  Component Value Date   HGBA1C 5.7* 07/13/2011     Lab Results  Component Value Date   TSH 0.082* 07/13/2011    Hepatic Function Panel  Basename 07/13/11 1200  PROT 8.5*  ALBUMIN 3.4*  AST 16  ALT 10  ALKPHOS 91  BILITOT 0.3  BILIDIR --  IBILI --   No results found for this basename: CHOL in the last 72 hours No results found for this basename: PROTIME in the last 72 hours    EKG: Orders placed during the hospital encounter of 07/13/11  . ED EKG  .  ED EKG  . EKG 12-LEAD    Studies/Results: Dg Chest 2 View  07/14/2011  *RADIOLOGY REPORT*  Clinical Data: Shortness of breath and weakness; follow up atelectasis.  CHEST - 2 VIEW  Comparison: Chest radiograph performed 07/13/2011  Findings: The lungs are well-aerated.  Vascular congestion is noted, with diffuse bilateral airspace opacities, left greater than right.  Findings are suspicious for mildly asymmetric pulmonary edema, mildly worsened on the right from the prior study. Persistent small bilateral pleural effusions are seen.  There is no evidence of pneumothorax.  The heart remains enlarged; the patient is status post median sternotomy, with evidence of prior CABG.  No acute osseous abnormalities are seen.  IMPRESSION: Vascular congestion and cardiomegaly, with diffuse bilateral airspace opacities, left greater than right.  Findings suspicious for mildly asymmetric pulmonary edema, mildly worsened on the right from the prior study.  Persistent small bilateral pleural effusions.  Original Report Authenticated By: Tonia Ghent, M.D.   Dg Chest Portable 1 View  07/13/2011  *RADIOLOGY REPORT*  Clinical Data: Shortness of breath, weakness  PORTABLE CHEST - 1 VIEW  Comparison: 06/09/2011; 06/08/2011; 06/05/2011  Findings: Unchanged enlarged cardiac silhouette and mediastinal contours  post mediastinotomy and CABG.  Pulmonary vasculature is indistinct with cephalization of flow.  There is persistent blunting of the bilateral costophrenic angles, left greater than right.  Grossly unchanged of bilateral perihilar and basilar opacities, left greater than right.  Grossly unchanged bones.  IMPRESSION: 1.  Findings suggestive of worsening pulmonary edema. 2.  Grossly unchanged small bilateral effusions and bibasilar opacities, left greater than right.  Original Report Authenticated By: Waynard Reeds, M.D.    Medications: I have reviewed the patient's current medications.  Assessment/Plan: Patient Active Problem List  Diagnoses  . OBESITY HYPOVENTILATION SYNDROME  . CORONARY ARTERY DISEASE  . COR PULMONALE  . DIASTOLIC DYSFUNCTION  . C O P D  . RESPIRATORY FAILURE, CHRONIC  . SLEEP APNEA  . ANGIOEDEMA  . PVD (peripheral vascular disease)  . Chronic renal insufficiency, stage III (moderate)  . Diabetes mellitus  . HTN (hypertension)  . Diastolic CHF, acute on chronic  anemia ckd stage 3  PLAN:negative cardiac enzymes, negative MI.  Hyperkalemia, will recheck BMP. CO2 has improved.   Recheck K at noon. Anemia. Panel abg pulmonary consult    Agree with L. [place] pac note   LOS: 1 day   INGOLD,LAURA R 07/14/2011, 9:08 AM

## 2011-07-14 NOTE — Consult Note (Signed)
PCCM Consult Note:  HISTORY of PRESENT ILLNESS:  [name] [name] is a 60 y.o. 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  Denies fevers, chills, n/v/d, chest pain.  ABG in ER demonstrates ph 7.35 / cO2 90 / pO2 80.  PCCM consulted for OSA / COPD mgmt.    CULTURES 11/29 Sputum>>>  ABX  BEST PRACITICE GI: not indicated DVT: lovenox Nutrition:  NPO for now, advance when mental status more clear   KEY EVENTS / STUDIES   Blood pressure 114/64, pulse 83, temperature 97.3 F (36.3 C), temperature source Oral, resp. rate 32, height 6\' 1"  (1.854 m), weight 127.3 kg (280 lb 10.3 oz), SpO2 98.00%.   Intake/Output Summary (Last 24 hours) at 07/14/11 1321 Last data filed at 07/13/11 2334  Gross per 24 hour  Intake  149.5 ml  Output   1100 ml  Net -950.5 ml   PHYICAL EXAM: General:  Obese male, somnolent but arousable Neuro: drowsy, slow to answer questions but appropriate, MAEW CV: s1s2 rrr, 2-3/6 SEM PULM: non-labored, lungs bilaterally diminished in bases LK:GMWNU/UVOZ, bsx4 active Extremities: warm/dry, significant pitting edema noted in legs bilaterally Derm: no rash or skin breakdown Neck: thick, no masses noted HEENT: PERRL, EOMi, OP clear, NCAT   LABS BMET    Component Value Date/Time   NA 149* 07/14/2011 0415   K 6.0* 07/14/2011 0415   CL 98 07/14/2011 0415   CO2 45* 07/14/2011 0415   GLUCOSE 133* 07/14/2011 0415   BUN 43* 07/14/2011 0415   CREATININE 1.68* 07/14/2011 0415   CALCIUM 9.2 07/14/2011 0415   CALCIUM 8.5 12/10/2010 0400   GFRNONAA 43* 07/14/2011 0415   GFRAA 50*  07/14/2011 0415   CBC    Component Value Date/Time   WBC 6.7 07/14/2011 0415   RBC 3.24* 07/14/2011 0415   HGB 8.8* 07/14/2011 0415   HCT 33.4* 07/14/2011 0415   PLT 198 07/14/2011 0415   MCV 103.1* 07/14/2011 0415   MCH 27.2 07/14/2011 0415   MCHC 26.3* 07/14/2011 0415   RDW 13.8 07/14/2011 0415   LYMPHSABS 0.4* 07/13/2011 1200   MONOABS 0.1 07/13/2011 1200   EOSABS 0.0 07/13/2011 1200   BASOSABS 0.0 07/13/2011 1200   ABG this ABG was drawn when patient had the exhalation valve closed.    Component Value Date/Time   PHART 7.122* 07/14/2011 1230   HCO3 46.8* 07/14/2011 1230   TCO2 51.4 07/14/2011 1230   O2SAT 91.2 07/14/2011 1230    RADIOLOGIC DATA 11/29 CXR>>>bilateral airspace disease (c/w Pulm edema), small bilateral effusions   ASSESSMENT/PLAN:  Diastolic CHF / HTN -worsening edema over past week prior to admit.   PLAN: - Continue negative balance with lasix. - Crestor - ASA/Plavix. - BP control per primary.  COPD / OSA / Cor Pulmonale.  Likely decompensated CHF.  No evidence of infection at present.  No evidence of acute bronchospasm, so would focus therapy on CHF and maintain him on home meds.  Acute on chronic respiratory acidosis since patient thought the BiPAP was leaking and blocked the exhalation valve but mentating well. PLAN:  - Hold advair (prior notes  from Dr. Maple Hudson state that this was not effective). - Spiriva. - Keep dry as renal fxn and BP tolerate. - Hold in SDU and back to BiPAP now and at night. - F/U sputum. - F/U CXR. - Hold abx for now, follow fever curve. - No indication for steroids at present. - O2 to keep sats 88-93%.  DM PLAN - Per primary svc.  Koren Bound MD Pager [phone]

## 2011-07-15 LAB — BLOOD GAS, ARTERIAL
Bicarbonate: 49 mEq/L — ABNORMAL HIGH (ref 20.0–24.0)
Delivery systems: POSITIVE
Expiratory PAP: 7
Patient temperature: 98.6
pH, Arterial: 7.273 — ABNORMAL LOW (ref 7.350–7.450)

## 2011-07-15 LAB — CBC
Platelets: 199 10*3/uL (ref 150–400)
RDW: 14.1 % (ref 11.5–15.5)
WBC: 6 10*3/uL (ref 4.0–10.5)

## 2011-07-15 LAB — BASIC METABOLIC PANEL
GFR calc Af Amer: 48 mL/min — ABNORMAL LOW (ref >90)
GFR calc non Af Amer: 41 mL/min — ABNORMAL LOW (ref >90)
Potassium: 4.7 mEq/L (ref 3.5–5.1)
Sodium: 151 mEq/L — ABNORMAL HIGH (ref 135–145)

## 2011-07-15 LAB — MAGNESIUM: Magnesium: 2.1 mg/dL (ref 1.5–2.5)

## 2011-07-15 LAB — GLUCOSE, CAPILLARY: Glucose-Capillary: 214 mg/dL — ABNORMAL HIGH (ref 70–99)

## 2011-07-15 MED ORDER — CHLORHEXIDINE GLUCONATE 0.12 % MT SOLN
15.0000 mL | Freq: Two times a day (BID) | OROMUCOSAL | Status: DC
  Administered 2011-07-15 – 2011-07-22 (×15): 15 mL via OROMUCOSAL
  Filled 2011-07-15 (×17): qty 15

## 2011-07-15 MED ORDER — BIOTENE DRY MOUTH MT LIQD
15.0000 mL | Freq: Two times a day (BID) | OROMUCOSAL | Status: DC
  Administered 2011-07-15 – 2011-07-22 (×13): 15 mL via OROMUCOSAL

## 2011-07-15 NOTE — Progress Notes (Signed)
CRITICAL VALUE ALERT  Critical value received:  PCO2 110  Date of notification:  07/15/2011  Time of notification:  4:39 AM  Critical value read back: yes  Nurse who received alert:  Birdena Crandall  MD notified (1st page):  PCCM/E-link  Time of first page:  4:48 AM  MD notified (2nd page):  Time of second page:  Responding MD:  Pola Corn MD  Time MD responded:  4:48 AM

## 2011-07-15 NOTE — Progress Notes (Signed)
PCCM Consult Note:  HISTORY of PRESENT ILLNESS:  [name] [name] is a 60 y.o. 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  Denies fevers, chills, n/v/d, chest pain.  ABG in ER demonstrates ph 7.35 / cO2 90 / pO2 80.  PCCM consulted for OSA / COPD mgmt.    CULTURES 11/29 Sputum>>>  ABX  BEST PRACITICE GI: not indicated DVT: lovenox Nutrition:  NPO for now, advance when mental status more clear   KEY EVENTS / STUDIES   Blood pressure 173/82, pulse 104, temperature 98.4 F (36.9 C), temperature source Axillary, resp. rate 24, height 6\' 1"  (1.854 m), weight 125 kg (275 lb 9.2 oz), SpO2 100.00%.   Intake/Output Summary (Last 24 hours) at 07/15/11 1216 Last data filed at 07/15/11 1000  Gross per 24 hour  Intake    143 ml  Output   4851 ml  Net  -4708 ml   PHYICAL EXAM: General:  Obese male, alert and oriented, follows all commands. Neuro: drowsy, slow to answer questions but appropriate, MAEW CV: s1s2 rrr, 2-3/6 SEM PULM: non-labored, lungs bilaterally diminished in bases RU:EAVWU/JWJX, bsx4 active Extremities: warm/dry, significant pitting edema noted in legs bilaterally Derm: no rash or skin breakdown Neck: thick, no masses noted HEENT: PERRL, EOMi, OP clear, NCAT   LABS BMET    Component Value Date/Time   NA 151* 07/15/2011 0500   K 4.7 07/15/2011 0500   CL 97 07/15/2011 0500   CO2 >45* 07/15/2011 0500   GLUCOSE 112* 07/15/2011 0500   BUN 52* 07/15/2011 0500   CREATININE 1.74* 07/15/2011 0500   CALCIUM 9.4 07/15/2011 0500   CALCIUM 8.5 12/10/2010 0400   GFRNONAA 41* 07/15/2011 0500   GFRAA 48* 07/15/2011 0500   CBC    Component Value Date/Time   WBC 6.0 07/15/2011 0500   RBC 3.32* 07/15/2011 0500   HGB 9.0* 07/15/2011 0500   HCT 34.0* 07/15/2011 0500   PLT 199 07/15/2011 0500   MCV 102.4* 07/15/2011 0500   MCH 27.1 07/15/2011 0500   MCHC 26.5* 07/15/2011 0500   RDW 14.1 07/15/2011 0500   LYMPHSABS 0.4* 07/13/2011 1200   MONOABS 0.1 07/13/2011 1200   EOSABS 0.0 07/13/2011 1200   BASOSABS 0.0 07/13/2011 1200       Component Value Date/Time   PHART 7.273* 07/15/2011 0426   HCO3 49.0* 07/15/2011 0426   TCO2 52.4 07/15/2011 0426   O2SAT 91.7 07/15/2011 0426    RADIOLOGIC DATA 11/29 CXR>>>bilateral airspace disease (c/w Pulm edema), small bilateral effusions   ASSESSMENT/PLAN:  Diastolic CHF / HTN -worsening edema over past week prior to admit.   PLAN: - Continue negative balance with lasix. - Crestor - ASA/Plavix. - BP control per primary.  COPD / OSA / Cor Pulmonale.  Likely decompensated CHF.  No evidence of infection at present.  No evidence of acute bronchospasm, so would focus therapy on CHF and maintain him on home meds.  Acute on chronic respiratory acidosis since patient thought the BiPAP was leaking and blocked the exhalation valve but mentating well.  Accept a pH of 2.73 for now until patient equilibrate properly and  will order one for AM.  PLAN:  - Hold advair (prior notes from Dr. Maple Hudson state that this was not effective). - Spiriva. - Keep dry as renal fxn and BP tolerate. - Hold in SDU and back to BiPAP at night. - F/U sputum. - ABG in AM. - Hold abx for now, follow fever curve. - No indication for steroids at present. - O2 to keep sats 88-93%.  DM PLAN - Per primary svc.  Koren Bound MD Pager [phone]

## 2011-07-15 NOTE — Progress Notes (Signed)
NIV changes made per RT to try and make pt more comfortable. Pt seems to be better tolerating new changes. RT will continue to monitor.

## 2011-07-15 NOTE — Progress Notes (Signed)
Subjective: Lethargic  Objective: Vital signs in last 24 hours: Temp:  [96.2 F (35.7 C)-98.4 F (36.9 C)] 98.4 F (36.9 C) (12/01 0800) Pulse Rate:  [81-104] 104  (12/01 0933) Resp:  [21-36] 22  (12/01 0800) BP: (103-173)/(57-82) 173/82 mmHg (12/01 0933) SpO2:  [87 %-100 %] 100 % (12/01 0915) FiO2 (%):  [35 %-40 %] 35 % (12/01 0800) Weight:  [125 kg (275 lb 9.2 oz)-127.3 kg (280 lb 10.3 oz)] 275 lb 9.2 oz (125 kg) (12/01 0500) Weight change: 0 kg (0 lb) Last BM Date: 07/14/11 Intake/Output from previous day: -950 yest.  -3734 in last 8 hours. Though wt is same. 11/30 0701 - 12/01 0700 In: 16.5 [I.V.:16.5] Out: 3751 [Urine:3750; Stool:1] Intake/Output this shift: Total I/O In: -  Out: 100 [Urine:100]  PE: General: answers questions. Heart: S1S2  RRR. No obvious murmur. ABD: +bs, soft nontender Ext: Brawny ankles.  Neuro: oriented X 3  Lab Results:  Basename 07/15/11 0500 07/14/11 0415  WBC 6.0 6.7  HGB 9.0* 8.8*  HCT 34.0* 33.4*  PLT 199 198   BMET  Basename 07/15/11 0500 07/14/11 1359  NA 151* 151*  K 4.7 5.6*  CL 97 100  CO2 >45* >45*  GLUCOSE 112* 146*  BUN 52* 52*  CREATININE 1.74* 1.92*  CALCIUM 9.4 9.4    Basename 07/13/11 2359 07/13/11 1520  TROPONINI <0.30 <0.30    Lab Results  Component Value Date   CHOL  Value: 88        ATP III CLASSIFICATION:  <200     mg/dL   Desirable  161-096  mg/dL   Borderline High  >=045    mg/dL   High        11/20/8117   HDL 29* 12/05/2010   LDLCALC  Value: 46        Total Cholesterol/HDL:CHD Risk Coronary Heart Disease Risk Table                     Men   Women  1/2 Average Risk   3.4   3.3  Average Risk       5.0   4.4  2 X Average Risk   9.6   7.1  3 X Average Risk  23.4   11.0        Use the calculated Patient Ratio above and the CHD Risk Table to determine the patient's CHD Risk.        ATP III CLASSIFICATION (LDL):  <100     mg/dL   Optimal  147-829  mg/dL   Near or Above                    Optimal  130-159  mg/dL    Borderline  562-130  mg/dL   High  >865     mg/dL   Very High 7/84/6962   TRIG 63 12/05/2010   CHOLHDL 3.0 12/05/2010   Lab Results  Component Value Date   HGBA1C 5.7* 07/13/2011     Lab Results  Component Value Date   TSH 0.082* 07/13/2011    Hepatic Function Panel  Basename 07/13/11 1200  PROT 8.5*  ALBUMIN 3.4*  AST 16  ALT 10  ALKPHOS 91  BILITOT 0.3  BILIDIR --  IBILI --   No results found for this basename: CHOL in the last 72 hours No results found for this basename: PROTIME in the last 72 hours    EKG: Orders placed during the hospital encounter  of 07/13/11  . ED EKG  . ED EKG  . EKG 12-LEAD    Studies/Results: Dg Chest 2 View  07/14/2011  *RADIOLOGY REPORT*  Clinical Data: Shortness of breath and weakness; follow up atelectasis.  CHEST - 2 VIEW  Comparison: Chest radiograph performed 07/13/2011  Findings: The lungs are well-aerated.  Vascular congestion is noted, with diffuse bilateral airspace opacities, left greater than right.  Findings are suspicious for mildly asymmetric pulmonary edema, mildly worsened on the right from the prior study. Persistent small bilateral pleural effusions are seen.  There is no evidence of pneumothorax.  The heart remains enlarged; the patient is status post median sternotomy, with evidence of prior CABG.  No acute osseous abnormalities are seen.  IMPRESSION: Vascular congestion and cardiomegaly, with diffuse bilateral airspace opacities, left greater than right.  Findings suspicious for mildly asymmetric pulmonary edema, mildly worsened on the right from the prior study.  Persistent small bilateral pleural effusions.  Original Report Authenticated By: Tonia Ghent, M.D.    Medications: I have reviewed the patient's current medications.  Assessment/Plan: Patient Active Problem List  Diagnoses  . OBESITY HYPOVENTILATION SYNDROME  . CORONARY ARTERY DISEASE  . COR PULMONALE  . DIASTOLIC DYSFUNCTION  . C O P D  . RESPIRATORY  FAILURE, CHRONIC  . SLEEP APNEA  . ANGIOEDEMA  . PVD (peripheral vascular disease)  . Chronic renal insufficiency, stage III (moderate)  . Diabetes mellitus  . HTN (hypertension)  . Diastolic CHF, acute on chronic  . Anemia  . Hyperkalemia    PLAN:  Continues with elevated PCO2 but improving.  CHF (diastolic)  improving with IV Lasix currently on 80 mg. BID. Hyperkalemia improved.  DM stable Renal insuff. With improved Cr.  Anemia, with Iron level of 26,  Recheck BNP, was 744 on admit.   LOS: 2 days   INGOLD,LAURA R 07/15/2011, 9:34 AM  Agree with note written by Nada Boozer RNP  Admitted with respiratory failure. Pt well known to me with CAD S/P CABG remotely with nl LV fxn. COPD with h/o fibrothorax s/p remote decortication. On home O2. Pt had CHF on CXR but BNP was only 700. Getting diuresed. No longer on BIPAP. He had a profound respiratory acidosis which is improving. Currently on O2 by NP. PCCM following. Other labs OK. Continue diuresis.OOB to chair.   Runell Gess 07/15/2011 9:48 AM

## 2011-07-16 ENCOUNTER — Inpatient Hospital Stay (HOSPITAL_COMMUNITY): Payer: Medicare Other

## 2011-07-16 DIAGNOSIS — J9610 Chronic respiratory failure, unspecified whether with hypoxia or hypercapnia: Secondary | ICD-10-CM

## 2011-07-16 DIAGNOSIS — J449 Chronic obstructive pulmonary disease, unspecified: Secondary | ICD-10-CM

## 2011-07-16 DIAGNOSIS — I509 Heart failure, unspecified: Secondary | ICD-10-CM

## 2011-07-16 DIAGNOSIS — J810 Acute pulmonary edema: Secondary | ICD-10-CM

## 2011-07-16 LAB — BASIC METABOLIC PANEL
BUN: 61 mg/dL — ABNORMAL HIGH (ref 6–23)
CO2: >45 mEq/L (ref 19–32)
Chloride: 89 mEq/L — ABNORMAL LOW (ref 96–112)
Creatinine, Ser: 2.27 mg/dL — ABNORMAL HIGH (ref 0.50–1.35)
GFR calc Af Amer: 35 mL/min — ABNORMAL LOW (ref >90)
Glucose, Bld: 117 mg/dL — ABNORMAL HIGH (ref 70–99)

## 2011-07-16 LAB — GLUCOSE, CAPILLARY
Glucose-Capillary: 82 mg/dL (ref 70–99)
Glucose-Capillary: 104 mg/dL — ABNORMAL HIGH (ref 70–99)
Glucose-Capillary: 178 mg/dL — ABNORMAL HIGH (ref 70–99)

## 2011-07-16 LAB — BLOOD GAS, ARTERIAL
Acid-Base Excess: 22.8 mmol/L — ABNORMAL HIGH (ref 0.0–2.0)
Bicarbonate: 52.9 mEq/L — ABNORMAL HIGH (ref 20.0–24.0)
FIO2: 0.25 %
Inspiratory PAP: 14
O2 Saturation: 99.4 %
Patient temperature: 98.6
TCO2: 58.5 mmol/L (ref 0–100)
pCO2 arterial: 84.1 mmHg (ref 35.0–45.0)
pH, Arterial: 7.388 (ref 7.350–7.450)
pO2, Arterial: 170 mmHg — ABNORMAL HIGH (ref 80.0–100.0)

## 2011-07-16 LAB — CBC
HCT: 33.9 % — ABNORMAL LOW (ref 39.0–52.0)
MCH: 26.9 pg (ref 26.0–34.0)
MCHC: 26 g/dL — ABNORMAL LOW (ref 30.0–36.0)
MCV: 103.7 fL — ABNORMAL HIGH (ref 78.0–100.0)
RDW: 14.2 % (ref 11.5–15.5)

## 2011-07-16 NOTE — Progress Notes (Signed)
CRITICAL VALUE ALERT  Critical value received:  CO2 52.5  Date of notification:  07/16/11  Time of notification:  0720  Critical value read back: Yes  Nurse who received alert:  Durward Mallard  MD notified (1st page):  [place]  Time of first page:  0725  Time MD responded:  [phone]

## 2011-07-16 NOTE — Progress Notes (Signed)
PCCM Consult f/u note  HISTORY of PRESENT ILLNESS:  [name] [name] is a 60 y.o. 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  Denies fevers, chills, n/v/d, chest pain.  ABG in ER demonstrates ph 7.35 / cO2 90 / pO2 80.  PCCM consulted for OSA / COPD mgmt.   Weekend PCCM cover- Patient known to me. Nurse in room. He denies pain, says through his BiPAP mask that he is getting enough air. Was up in chair most of yesterday and has dangled at bedside. Nurse notes more lethargic today. CXR reviewed and discussed with Dr Allyson Sabal.    CULTURES 11/29 Sputum>>>  ABX  BEST PRACITICE GI: not indicated DVT: lovenox Nutrition:  NPO for now, advance when mental status more clear   KEY EVENTS / STUDIES   Blood pressure 118/60, pulse 96, temperature 98.5 F (36.9 C), temperature source Oral, resp. rate 22, height 6\' 1"  (1.854 m), weight 123.3 kg (271 lb 13.2 oz), SpO2 98.00%.   Intake/Output Summary (Last 24 hours) at 07/16/11 1052 Last data filed at 07/16/11 0900  Gross per 24 hour  Intake    169 ml  Output   1825 ml  Net  -1656 ml   PHYICAL EXAM: General:  Obese male, lethargic, oriented, follows all commands, responds with 1 word answers. Neuro: drowsy, slow to answer questions but appropriate, MAEW CV: s1s2 rrr, 2-3/6 SEM PULM: non-labored, lungs bilaterally diminished in bases BJ:SEGBT/DVVO, bsx4 active Extremities: warm/dry, 1-2+ pitting edema noted in legs bilaterally Derm: no rash or skin breakdown Neck: thick, no masses noted HEENT: PERRL, EOMi, OP clear, NCAT   LABS BMET    Component  Value Date/Time   NA 143 07/16/2011 0500   K 5.5* 07/16/2011 0500   CL 89* 07/16/2011 0500   CO2 >45* 07/16/2011 0500   GLUCOSE 117* 07/16/2011 0500   BUN 61* 07/16/2011 0500   CREATININE 2.27* 07/16/2011 0500   CALCIUM 9.0 07/16/2011 0500   CALCIUM 8.5 12/10/2010 0400   GFRNONAA 30* 07/16/2011 0500   GFRAA 35* 07/16/2011 0500   CBC    Component Value Date/Time   WBC 6.0 07/16/2011 0500   RBC 3.27* 07/16/2011 0500   HGB 8.8* 07/16/2011 0500   HCT 33.9* 07/16/2011 0500   PLT 237 07/16/2011 0500   MCV 103.7* 07/16/2011 0500   MCH 26.9 07/16/2011 0500   MCHC 26.0* 07/16/2011 0500   RDW 14.2 07/16/2011 0500   LYMPHSABS 0.4* 07/13/2011 1200   MONOABS 0.1 07/13/2011 1200   EOSABS 0.0 07/13/2011 1200   BASOSABS 0.0 07/13/2011 1200       Component Value Date/Time   PHART 7.388 07/16/2011 0530   HCO3 49.6* 07/16/2011 0530   TCO2 52.2 07/16/2011 0530   O2SAT 73.6 07/16/2011 0530    RADIOLOGIC DATA 11/29 CXR>>>bilateral airspace disease (c/w Pulm edema), small bilateral effusions 12/2 CXR>>> improving CHF on portable view, CE with lines in place. Still bilateral pulmonary edema.  ASSESSMENT/PLAN:  1)Diastolic CHF / HTN -worsening edema over past week prior to admit.   PLAN: - Continue negative balance with lasix. - Crestor - ASA/Plavix. - BP control per  primary. 2) Hypercapneic respiratory failure due to underlying COPD with decompensated CHF/ edema.  2)COPD / OSA / Cor Pulmonale.   PLAN: Continue BiPAP as needed. Avoid respiratory depressants due to CO2 retention.  Mobilize- discussed w/ nurse. He is likely to drift off and be a fall risk if left sitting unattended for long.  If diuresis doesn't improve ventilation, then we can try adding theophyline as a respiratory stimulant  DM PLAN - Per primary svc.  Waymon Budge MD Pager [phone]

## 2011-07-16 NOTE — Progress Notes (Signed)
eLink Physician-Brief Progress Note Patient Name: [name] [name] DOB: [birth year] MRN: [id number]  Date of Service  07/16/2011   HPI/Events of Note   ABG shows pco2 110  Ph 7.27  eICU Interventions  See orders for bipap and f/u ABG   Intervention Category Major Interventions: Respiratory failure - evaluation and management  Shan Levans 07/16/2011, 4:06 AM

## 2011-07-16 NOTE — Progress Notes (Signed)
Subjective:  On BiPAP but less SOB  Objective:  Temp:  [95.8 F (35.4 C)-98.9 F (37.2 C)] 98.5 F (36.9 C) (12/02 0845) Pulse Rate:  [74-100] 96  (12/02 1028) Resp:  [20-30] 22  (12/02 0800) BP: (103-142)/(41-86) 118/60 mmHg (12/02 1028) SpO2:  [94 %-100 %] 98 % (12/02 0823) FiO2 (%):  [25 %-50 %] 50 % (12/02 0800) Weight:  [123.3 kg (271 lb 13.2 oz)] 271 lb 13.2 oz (123.3 kg) (12/02 0600) Weight change: -4 kg (-8 lb 13.1 oz)  Intake/Output from previous day: 12/01 0701 - 12/02 0700 In: 290.5 [P.O.:240; I.V.:34.5; IV Piggyback:16] Out: 2575 [Urine:2575]  Intake/Output from this shift: Total I/O In: 11 [I.V.:3; IV Piggyback:8] Out: 350 [Urine:350]  Physical Exam: General appearance: alert and cooperative Neck: no adenopathy, no carotid bruit, no JVD, supple, symmetrical, trachea midline and thyroid not enlarged, symmetric, no tenderness/mass/nodules Lungs: clear to auscultation bilaterally Heart: regular rate and rhythm, S1, S2 normal, no murmur, click, rub or gallop Extremities: extremities normal, atraumatic, no cyanosis or edema  Lab Results: Results for orders placed during the hospital encounter of 07/13/11 (from the past 48 hour(s))  GLUCOSE, CAPILLARY     Status: Abnormal   Collection Time   07/14/11 12:25 PM      Component Value Range Comment   Glucose-Capillary 155 (*) 70 - 99 (mg/dL)   BLOOD GAS, ARTERIAL     Status: Abnormal   Collection Time   07/14/11 12:30 PM      Component Value Range Comment   O2 Content 3.0      Delivery systems BILEVEL POSITIVE AIRWAY PRESSURE      pH, Arterial 7.122 (*) 7.350 - 7.450     pCO2 arterial 150.0 (*) 35.0 - 45.0 (mmHg)    pO2, Arterial 66.9 (*) 80.0 - 100.0 (mmHg)    Bicarbonate 46.8 (*) 20.0 - 24.0 (mEq/L)    TCO2 51.4  0 - 100 (mmol/L)    Acid-Base Excess 17.3 (*) 0.0 - 2.0 (mmol/L)    O2 Saturation 91.2      Patient temperature 98.6      Collection site RIGHT RADIAL      Drawn by [phone]      Sample type ARTERIAL  DRAW      Allens test (pass/fail) PASS  PASS    BASIC METABOLIC PANEL     Status: Abnormal   Collection Time   07/14/11  1:59 PM      Component Value Range Comment   Sodium 151 (*) 135 - 145 (mEq/L)    Potassium 5.6 (*) 3.5 - 5.1 (mEq/L) NO VISIBLE HEMOLYSIS   Chloride 100  96 - 112 (mEq/L)    CO2 >45 (*) 19 - 32 (mEq/L)    Glucose, Bld 146 (*) 70 - 99 (mg/dL)    BUN 52 (*) 6 - 23 (mg/dL)    Creatinine, Ser 4.09 (*) 0.50 - 1.35 (mg/dL)    Calcium 9.4  8.4 - 10.5 (mg/dL)    GFR calc non Af Amer 37 (*) >90 (mL/min)    GFR calc Af Amer 42 (*) >90 (mL/min)   VITAMIN B12     Status: Abnormal   Collection Time   07/14/11  1:59 PM      Component Value Range Comment   Vitamin B-12 1049 (*) 211 - 911 (pg/mL)   FOLATE     Status: Normal   Collection Time   07/14/11  1:59 PM      Component Value Range Comment  Folate 17.6     IRON AND TIBC     Status: Abnormal   Collection Time   07/14/11  1:59 PM      Component Value Range Comment   Iron 26 (*) 42 - 135 (ug/dL)    TIBC 161  096 - 045 (ug/dL)    Saturation Ratios 11 (*) 20 - 55 (%)    UIBC 213  125 - 400 (ug/dL)   FERRITIN     Status: Abnormal   Collection Time   07/14/11  1:59 PM      Component Value Range Comment   Ferritin 540 (*) 22 - 322 (ng/mL)   RETICULOCYTES     Status: Abnormal   Collection Time   07/14/11  1:59 PM      Component Value Range Comment   Retic Ct Pct 2.1  0.4 - 3.1 (%)    RBC. 3.37 (*) 4.22 - 5.81 (MIL/uL)    Retic Count, Manual 70.8  19.0 - 186.0 (K/uL)   BLOOD GAS, ARTERIAL     Status: Abnormal   Collection Time   07/14/11  2:20 PM      Component Value Range Comment   O2 Content 3.0      Delivery systems BILEVEL POSITIVE AIRWAY PRESSURE      pH, Arterial 7.187 (*) 7.350 - 7.450     pCO2 arterial 127.0 (*) 35.0 - 45.0 (mmHg)    pO2, Arterial 53.2 (*) 80.0 - 100.0 (mmHg)    Bicarbonate 46.4 (*) 20.0 - 24.0 (mEq/L)    TCO2 50.4  0 - 100 (mmol/L)    Acid-Base Excess 17.8 (*) 0.0 - 2.0 (mmol/L)     O2 Saturation 84.7      Patient temperature 98.6      Collection site RIGHT RADIAL      Drawn by COLLECTED BY RT      Sample type ARTERIAL DRAW      Allens test (pass/fail) PASS  PASS    GLUCOSE, CAPILLARY     Status: Abnormal   Collection Time   07/14/11  4:33 PM      Component Value Range Comment   Glucose-Capillary 116 (*) 70 - 99 (mg/dL)   BLOOD GAS, ARTERIAL     Status: Abnormal   Collection Time   07/14/11  7:36 PM      Component Value Range Comment   FIO2 .40      Delivery systems BILEVEL POSITIVE AIRWAY PRESSURE      Rate 8      Inspiratory PAP 10      Expiratory PAP 7      pH, Arterial 7.231 (*) 7.350 - 7.450     pCO2 arterial 118.0 (*) 35.0 - 45.0 (mmHg)    pO2, Arterial 71.1 (*) 80.0 - 100.0 (mmHg)    Bicarbonate 47.6 (*) 20.0 - 24.0 (mEq/L)    TCO2 51.2  0 - 100 (mmol/L)    Acid-Base Excess 19.5 (*) 0.0 - 2.0 (mmol/L)    O2 Saturation 95.0      Patient temperature 98.6      Collection site RIGHT RADIAL      Drawn by [phone]      Sample type ARTERIAL DRAW      Allens test (pass/fail) PASS  PASS    GLUCOSE, CAPILLARY     Status: Normal   Collection Time   07/14/11 10:25 PM      Component Value Range Comment   Glucose-Capillary 95  70 - 99 (mg/dL)  Comment 1 Documented in Chart      Comment 2 Notify RN     BLOOD GAS, ARTERIAL     Status: Abnormal   Collection Time   07/15/11  4:26 AM      Component Value Range Comment   FIO2 .35      Delivery systems BILEVEL POSITIVE AIRWAY PRESSURE      Rate 8      Inspiratory PAP 14      Expiratory PAP 7      pH, Arterial 7.273 (*) 7.350 - 7.450     pCO2 arterial 110.0 (*) 35.0 - 45.0 (mmHg)    pO2, Arterial 60.6 (*) 80.0 - 100.0 (mmHg)    Bicarbonate 49.0 (*) 20.0 - 24.0 (mEq/L)    TCO2 52.4  0 - 100 (mmol/L)    Acid-Base Excess 21.3 (*) 0.0 - 2.0 (mmol/L)    O2 Saturation 91.7      Patient temperature 98.6      Collection site RIGHT RADIAL      Drawn by [phone]      Sample type ARTERIAL DRAW      Allens test  (pass/fail) PASS  PASS    CBC     Status: Abnormal   Collection Time   07/15/11  5:00 AM      Component Value Range Comment   WBC 6.0  4.0 - 10.5 (K/uL)    RBC 3.32 (*) 4.22 - 5.81 (MIL/uL)    Hemoglobin 9.0 (*) 13.0 - 17.0 (g/dL)    HCT 04.5 (*) 40.9 - 52.0 (%)    MCV 102.4 (*) 78.0 - 100.0 (fL)    MCH 27.1  26.0 - 34.0 (pg)    MCHC 26.5 (*) 30.0 - 36.0 (g/dL)    RDW 81.1  91.4 - 78.2 (%)    Platelets 199  150 - 400 (K/uL)   MAGNESIUM     Status: Normal   Collection Time   07/15/11  5:00 AM      Component Value Range Comment   Magnesium 2.1  1.5 - 2.5 (mg/dL)   BASIC METABOLIC PANEL     Status: Abnormal   Collection Time   07/15/11  5:00 AM      Component Value Range Comment   Sodium 151 (*) 135 - 145 (mEq/L)    Potassium 4.7  3.5 - 5.1 (mEq/L)    Chloride 97  96 - 112 (mEq/L)    CO2 >45 (*) 19 - 32 (mEq/L)    Glucose, Bld 112 (*) 70 - 99 (mg/dL)    BUN 52 (*) 6 - 23 (mg/dL)    Creatinine, Ser 9.56 (*) 0.50 - 1.35 (mg/dL)    Calcium 9.4  8.4 - 10.5 (mg/dL)    GFR calc non Af Amer 41 (*) >90 (mL/min)    GFR calc Af Amer 48 (*) >90 (mL/min)   PHOSPHORUS     Status: Normal   Collection Time   07/15/11  5:00 AM      Component Value Range Comment   Phosphorus 2.4  2.3 - 4.6 (mg/dL)   GLUCOSE, CAPILLARY     Status: Normal   Collection Time   07/15/11  7:55 AM      Component Value Range Comment   Glucose-Capillary 96  70 - 99 (mg/dL)   GLUCOSE, CAPILLARY     Status: Abnormal   Collection Time   07/15/11 12:42 PM      Component Value Range Comment   Glucose-Capillary 133 (*) 70 -  99 (mg/dL)    Comment 1 Documented in Chart      Comment 2 Notify RN     GLUCOSE, CAPILLARY     Status: Abnormal   Collection Time   07/15/11  4:45 PM      Component Value Range Comment   Glucose-Capillary 176 (*) 70 - 99 (mg/dL)    Comment 1 Documented in Chart      Comment 2 Notify RN     GLUCOSE, CAPILLARY     Status: Abnormal   Collection Time   07/15/11  9:17 PM      Component Value Range  Comment   Glucose-Capillary 214 (*) 70 - 99 (mg/dL)   BLOOD GAS, ARTERIAL     Status: Abnormal   Collection Time   07/16/11  3:50 AM      Component Value Range Comment   O2 Content 3.0      pH, Arterial 7.088 (*) 7.350 - 7.450     pCO2 arterial 183.0 (*) 35.0 - 45.0 (mmHg)    pO2, Arterial 170.0 (*) 80.0 - 100.0 (mmHg)    Bicarbonate 52.9 (*) 20.0 - 24.0 (mEq/L)    TCO2 58.5  0 - 100 (mmol/L)    Acid-Base Excess 22.8 (*) 0.0 - 2.0 (mmol/L)    O2 Saturation 99.4      Patient temperature 98.6      Allens test (pass/fail) PASS  PASS    BASIC METABOLIC PANEL     Status: Abnormal   Collection Time   07/16/11  5:00 AM      Component Value Range Comment   Sodium 143  135 - 145 (mEq/L)    Potassium 5.5 (*) 3.5 - 5.1 (mEq/L) HEMOLYSIS AT THIS LEVEL MAY AFFECT RESULT   Chloride 89 (*) 96 - 112 (mEq/L)    CO2 >45 (*) 19 - 32 (mEq/L)    Glucose, Bld 117 (*) 70 - 99 (mg/dL)    BUN 61 (*) 6 - 23 (mg/dL)    Creatinine, Ser 6.57 (*) 0.50 - 1.35 (mg/dL)    Calcium 9.0  8.4 - 10.5 (mg/dL)    GFR calc non Af Amer 30 (*) >90 (mL/min)    GFR calc Af Amer 35 (*) >90 (mL/min)   CBC     Status: Abnormal   Collection Time   07/16/11  5:00 AM      Component Value Range Comment   WBC 6.0  4.0 - 10.5 (K/uL)    RBC 3.27 (*) 4.22 - 5.81 (MIL/uL)    Hemoglobin 8.8 (*) 13.0 - 17.0 (g/dL)    HCT 84.6 (*) 96.2 - 52.0 (%)    MCV 103.7 (*) 78.0 - 100.0 (fL)    MCH 26.9  26.0 - 34.0 (pg)    MCHC 26.0 (*) 30.0 - 36.0 (g/dL)    RDW 95.2  84.1 - 32.4 (%)    Platelets 237  150 - 400 (K/uL)   PRO B NATRIURETIC PEPTIDE     Status: Abnormal   Collection Time   07/16/11  5:00 AM      Component Value Range Comment   BNP, POC 721.9 (*) 0 - 125 (pg/mL)   MAGNESIUM     Status: Normal   Collection Time   07/16/11  5:00 AM      Component Value Range Comment   Magnesium 2.3  1.5 - 2.5 (mg/dL)   BLOOD GAS, ARTERIAL     Status: Abnormal   Collection Time   07/16/11  5:30 AM  Component Value Range Comment   FIO2 .25       Inspiratory PAP 14      Expiratory PAP 8      pH, Arterial 7.388  7.350 - 7.450     pCO2 arterial 84.1 (*) 35.0 - 45.0 (mmHg)    pO2, Arterial 34.2 (*) 80.0 - 100.0 (mmHg)    Bicarbonate 49.6 (*) 20.0 - 24.0 (mEq/L)    TCO2 52.2  0 - 100 (mmol/L)    Acid-Base Excess 23.0 (*) 0.0 - 2.0 (mmol/L)    O2 Saturation 73.6      Patient temperature 98.6      Allens test (pass/fail) PASS  PASS    GLUCOSE, CAPILLARY     Status: Normal   Collection Time   07/16/11  8:46 AM      Component Value Range Comment   Glucose-Capillary 82  70 - 99 (mg/dL)    Comment 1 Documented in Chart      Comment 2 Notify RN       Imaging: Imaging results have been reviewed  Assessment/Plan:   1. Principal Problem: 2.  *Diastolic CHF, acute on chronic 3. Active Problems: 4.  OBESITY HYPOVENTILATION SYNDROME 5.  CORONARY ARTERY DISEASE 6.  COR PULMONALE 7.  DIASTOLIC DYSFUNCTION 8.  C O P D 9.  RESPIRATORY FAILURE, CHRONIC 10.  SLEEP APNEA 11.  PVD (peripheral vascular disease) 12.  Chronic renal insufficiency, stage III (moderate) 13.  Diabetes mellitus 14.  HTN (hypertension) 15.  Anemia 16.  Hyperkalemia 17.   Time Spent Directly with Patient:  20 minutes  Length of Stay:  LOS: 3 days   Admitted for respiratory failure. PCCM following. Diuresing. Less SOB. On BiPAP. OOB to chair. Prob transfer to tele tomorrow.  [name] [name] J 07/16/2011, 11:13 AM

## 2011-07-17 ENCOUNTER — Encounter (HOSPITAL_COMMUNITY): Payer: Medicare Other

## 2011-07-17 LAB — CBC
MCH: 26.7 pg (ref 26.0–34.0)
MCV: 99.4 fL (ref 78.0–100.0)
Platelets: 195 10*3/uL (ref 150–400)
RDW: 14.5 % (ref 11.5–15.5)

## 2011-07-17 LAB — GLUCOSE, CAPILLARY
Glucose-Capillary: 197 mg/dL — ABNORMAL HIGH (ref 70–99)
Glucose-Capillary: 126 mg/dL — ABNORMAL HIGH (ref 70–99)

## 2011-07-17 LAB — BASIC METABOLIC PANEL
BUN: 60 mg/dL — ABNORMAL HIGH (ref 6–23)
CO2: >45 mEq/L (ref 19–32)
Calcium: 8.9 mg/dL (ref 8.4–10.5)
Creatinine, Ser: 1.86 mg/dL — ABNORMAL HIGH (ref 0.50–1.35)
Glucose, Bld: 151 mg/dL — ABNORMAL HIGH (ref 70–99)

## 2011-07-17 MED ORDER — FUROSEMIDE 40 MG PO TABS
40.0000 mg | ORAL_TABLET | Freq: Two times a day (BID) | ORAL | Status: DC
  Administered 2011-07-17 – 2011-07-18 (×2): 40 mg via ORAL
  Filled 2011-07-17 (×4): qty 1

## 2011-07-17 NOTE — Progress Notes (Addendum)
Patient ID: [name] [name], male   DOB: [birth year], 60 y.o.   MRN: [id number]   [facility]  Subjective: Patient feels better.  He is close to his usual self   Objective: Vital signs in last 24 hours: Temp:  [97.9 F (36.6 C)-99.2 F (37.3 C)] 98.4 F (36.9 C) (12/03 0800) Pulse Rate:  [78-101] 91  (12/03 1016) Resp:  [21-31] 23  (12/03 0920) BP: (92-132)/(48-81) 120/58 mmHg (12/03 1016) SpO2:  [98 %-100 %] 100 % (12/03 1119) FiO2 (%):  [40 %-50 %] 40 % (12/03 0727) Weight:  [123 kg (271 lb 2.7 oz)] 271 lb 2.7 oz (123 kg) (12/03 0500) Last BM Date: 07/14/11  Intake/Output from previous day: 12/02 0701 - 12/03 0700 In: 670.5 [P.O.:540; I.V.:114.5; IV Piggyback:16] Out: 3525 [Urine:3525] Intake/Output this shift: Total I/O In: -  Out: 500 [Urine:500]  Medications Current Facility-Administered Medications  Medication Dose Route Frequency Provider Last Rate Last Dose  . acetaminophen (TYLENOL) tablet 650 mg  650 mg Oral Q4H PRN Abelino Derrick, PA      . albuterol (PROVENTIL HFA;VENTOLIN HFA) 108 (90 BASE) MCG/ACT inhaler 2 puff  2 puff Inhalation Q6H PRN Abelino Derrick, PA      . antiseptic oral rinse (BIOTENE) solution 15 mL  15 mL Mouth Rinse q12n4p Runell Gess, MD   15 mL at 07/16/11 1630  . aspirin EC tablet 81 mg  81 mg Oral Daily Eda Paschal [place], [place]   81 mg at 07/17/11 1017  . chlorhexidine (PERIDEX) 0.12 % solution 15 mL  15 mL Mouth/Throat BID Runell Gess, MD   15 mL at 07/17/11 0803  . clopidogrel (PLAVIX) tablet 75 mg  75 mg Oral Q breakfast Eda Paschal [place], [place]   75 mg at 07/17/11 4098  . colchicine tablet 0.6 mg  0.6 mg Oral Daily Eda Paschal [place], PA   0.6 mg at 07/17/11 1017  . enoxaparin (LOVENOX) injection 40 mg  40 mg Subcutaneous Q24H Eda Paschal [place], [place]   40 mg at 07/16/11 2109  . furosemide (LASIX) injection 80 mg  80 mg Intravenous BID Eda Paschal [place], [place]   80 mg at 07/17/11 1191  . insulin aspart (novoLOG) injection 0-5 Units   0-5 Units Subcutaneous QHS Eda Paschal [place], [place]   2 Units at 07/15/11 2133  . insulin aspart (novoLOG) injection 0-9 Units  0-9 Units Subcutaneous TID WC Abelino Derrick, PA   2 Units at 07/16/11 1730  . metoprolol tartrate (LOPRESSOR) tablet 12.5 mg  12.5 mg Oral BID Eda Paschal [place], PA   12.5 mg at 07/17/11 1017  . multivitamins ther. w/minerals tablet 1 tablet  1 tablet Oral Daily Eda Paschal [place], [place]   1 tablet at 07/16/11 1030  . nitroGLYCERIN (NITROSTAT) SL tablet 0.4 mg  0.4 mg Sublingual Q5 min PRN Abelino Derrick, PA      . nitroGLYCERIN 0.2 mg/mL in dextrose 5 % infusion  5 mcg/min Intravenous Titrated Abelino Derrick, PA 1.5 mL/hr at 07/17/11 0600 5 mcg/min at 07/17/11 0600  . ondansetron (ZOFRAN) injection 4 mg  4 mg Intravenous Q6H PRN Abelino Derrick, PA      . pantoprazole (PROTONIX) EC tablet 40 mg  40 mg Oral Daily Eda Paschal [place], [place]   40 mg at 07/17/11 1016  . polysaccharide iron (NIFEREX) capsule 150 mg  150 mg Oral BID Abelino Derrick, PA   150 mg at 07/17/11 1016  .  rosuvastatin (CRESTOR) tablet 20 mg  20 mg Oral q1800 Eda Paschal [place], [place]   20 mg at 07/16/11 1805  . sodium chloride 0.9 % injection 3 mL  3 mL Intravenous PRN Abelino Derrick, PA      . Tamsulosin HCl (FLOMAX) capsule 0.4 mg  0.4 mg Oral Daily Eda Paschal [place], PA   0.4 mg at 07/17/11 1016  . tiotropium (SPIRIVA) inhalation capsule 18 mcg  18 mcg Inhalation Daily Max Fickle, MD   18 mcg at 07/17/11 1118   Facility-Administered Medications Ordered in Other Encounters  Medication Dose Route Frequency Provider Last Rate Last Dose  . darbepoetin alfa-polysorbate (ARANESP) injection 40 mcg  40 mcg Subcutaneous Q14 Days Irena Cords, MD   40 mcg at 06/19/11 1345    PE: General appearance: alert, cooperative and no distress Lungs: Dcreased BS bilaterally. Greater on right. Heart: regular rate and rhythm and 1/6 Sytolic MM Extremities: No LEE Pulses: 2+ radials.  Decreased DPs at PT.  His feet are warm.  Lab Results:    Basename 07/16/11 0500 07/15/11 0500  WBC 6.0 6.0  HGB 8.8* 9.0*  HCT 33.9* 34.0*  PLT 237 199   CBC    Component Value Date/Time   WBC 6.6 07/17/2011 1221   RBC 3.11* 07/17/2011 1221   HGB 8.3* 07/17/2011 1221   HCT 30.9* 07/17/2011 1221   PLT 195 07/17/2011 1221   MCV 99.4 07/17/2011 1221   MCH 26.7 07/17/2011 1221   MCHC 26.9* 07/17/2011 1221   RDW 14.5 07/17/2011 1221   LYMPHSABS 0.4* 07/13/2011 1200   MONOABS 0.1 07/13/2011 1200   EOSABS 0.0 07/13/2011 1200   BASOSABS 0.0 07/13/2011 1200     BMET  Basename 07/16/11 0500 07/15/11 0500 07/14/11 1359  NA 143 151* 151*  K 5.5* 4.7 5.6*  CL 89* 97 100  CO2 >45* >45* >45*  GLUCOSE 117* 112* 146*  BUN 61* 52* 52*  CREATININE 2.27* 1.74* 1.92*  CALCIUM 9.0 9.4 9.4   K - 4.2, Cr 1.86  Studies/Results:  Initial CXR indicated significant Pulm edema c/w Dx of Acute on chronic Diastolic CHF. CXR-07/16/11 Findings: Previous coronary bypass changes noted. Heart remains  enlarged. Minimal improvement in diffuse edema pattern with  associated basilar atelectasis. Small effusions evident. No  pneumothorax.  IMPRESSION:  Improving CHF pattern.  Assessment/Plan  Principal Problem:  *Diastolic CHF, acute on chronic Active Problems:  OBESITY HYPOVENTILATION SYNDROME  CORONARY ARTERY DISEASE  COR PULMONALE  DIASTOLIC DYSFUNCTION  C O P D  RESPIRATORY FAILURE, CHRONIC  SLEEP APNEA  PVD (peripheral vascular disease)  Chronic renal insufficiency, stage III (moderate)  Diabetes mellitus  HTN (hypertension)  Anemia  Hyperkalemia  Plan:  Checking CBC and BMET now.  5.5 liters diuresed in the last two days.  Worsening Cr>.  No LEE.  CXR shows improvement.  Change IV lasix to 40mg  PO BID.   He already received IV dose this AM.  He will likely need to go back to home dose of 80mg  BID.  Checking CBC and BMET in AM.  BP/HR stable.  HR in the 90's.   LOS: 4 days    HAGER,BRYAN W 07/17/2011 11:31 AM  I have seen and examined the  patient along with Wilburt Finlay, PA.   I have reviewed the chart, notes and new data.  I agree with Bryan's note.  Key new complaints: Feels better Key examination changes: agree with Bryan's exam Key new findings / data: Hgb down to  8.3; Cr. Improved.; CXR from yesterday indicates improving CHF (pulm edema pattern).  Baseline CO2 level > 45 - c/w chronic lung disease (obesity hypoventilation syndrome,Cor pulmonale & COPD) Renal insufficiency - Cr. Looks better. Stable. Hypernatremia - resolved with diuresis, not sure of cause.  PLAN: Agree with changing to basal PO Lasix dose. Check guiac stool & d/c Rx dose Lovenox DM - not well controlled, need to relook home regimen, check Hgb A1c Otherwise is nearing being ready for d/c - tomorrow at earliest.  [name], M.D., M.S. [facility] [address] [place], [place]  [address]  [phone]  07/17/2011 7:55 PM

## 2011-07-17 NOTE — Clinical Documentation Improvement (Signed)
Abnormal Labs Clarification  THIS DOCUMENT IS NOT A PERMANENT PART OF THE MEDICAL RECORD  TO RESPOND TO THE THIS QUERY, FOLLOW THE INSTRUCTIONS BELOW:  1. If needed, update documentation for the patient's encounter via the notes activity.  2. Access this query again and click edit on the [employer].  3. After updating, or not, click F2 to complete all highlighted (required) fields concerning your review. Select "additional documentation in the medical record" OR "no additional documentation provided".  4. Click Sign note button.  5. The deficiency will fall out of your InBasket *Please let us know if you are not able to compete this workflow by phone or e-mail (listed below).  Please update your documentation within the medical record to reflect your response to this query.                                                                                   07/17/11  Dear [name]  In a better effort to capture your patient's severity of illness, reflect appropriate length of stay and utilization of resources, a review of the medical record has revealed the following indicators.    Based on your clinical judgment, please clarify and document in a progress note and/or discharge summary the clinical condition associated with the following supporting information:  The fact that a query is asked, does not imply that any particular answer is desired or expected.  Abnormal findings (laboratory, x-ray, pathologic, and other diagnostic results) are not coded and reported unless the physician indicates their clinical significance.   The medical record reflects the following clinical findings, please clarify the diagnostic and/or clinical significance:      Possible Clinical Conditions?                                  Hypernatremia                                                     Other Condition___________________                 Cannot Clinically  Determine_________   Supporting Information:  Risk Factors:   "CKD 3"  Labs: Sodium: 146  (12-3) Down from 151 (12-1)  Treatment: Monitoring Labs   Query answered    Thank You,  Rossie Muskrat RN, BSN Clinical Documentation Specialist Pager:  [phone] [email or web address]   [employer] Information Management [place]

## 2011-07-17 NOTE — Progress Notes (Signed)
PCCM Consult Note:  HISTORY of PRESENT ILLNESS:  [name] [name] is a 60 y.o. 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  Denies fevers, chills, n/v/d, chest pain.  ABG in ER demonstrates ph 7.35 / cO2 90 / pO2 80.  PCCM consulted for OSA / COPD mgmt.    CULTURES 11/29 Sputum>>>  ABX  BEST PRACITICE GI: not indicated DVT: lovenox Nutrition:  Diet per primary.  KEY EVENTS / STUDIES  Blood pressure 98/54, pulse 83, temperature 97.9 F (36.6 C), temperature source Oral, resp. rate 28, height 6\' 1"  (1.854 m), weight 123 kg (271 lb 2.7 oz), SpO2 100.00%.  Intake/Output Summary (Last 24 hours) at 07/17/11 1317 Last data filed at 07/17/11 1100  Gross per 24 hour  Intake    988 ml  Output   3225 ml  Net  -2237 ml   PHYICAL EXAM: General:  Obese male, alert and oriented, follows all commands. Neuro: drowsy, slow to answer questions but appropriate, MAEW CV: s1s2 rrr, 2-3/6 SEM PULM: non-labored, lungs bilaterally diminished in bases RU:EAVWU/JWJX, bsx4 active Extremities: warm/dry, significant pitting edema noted in legs bilaterally Derm: no rash or skin breakdown Neck: thick, no masses noted HEENT: PERRL, EOMi, OP clear, NCAT  LABS BMET    Component Value Date/Time   NA 143 07/16/2011 0500   K 5.5* 07/16/2011 0500   CL 89* 07/16/2011 0500   CO2 >45* 07/16/2011 0500   GLUCOSE 117* 07/16/2011 0500   BUN 61* 07/16/2011 0500   CREATININE 2.27* 07/16/2011 0500   CALCIUM 9.0 07/16/2011 0500   CALCIUM 8.5 12/10/2010 0400   GFRNONAA 30* 07/16/2011 0500   GFRAA 35* 07/16/2011 0500   CBC      Component Value Date/Time   WBC 6.6 07/17/2011 1221   RBC 3.11* 07/17/2011 1221   HGB 8.3* 07/17/2011 1221   HCT 30.9* 07/17/2011 1221   PLT 195 07/17/2011 1221   MCV 99.4 07/17/2011 1221   MCH 26.7 07/17/2011 1221   MCHC 26.9* 07/17/2011 1221   RDW 14.5 07/17/2011 1221   LYMPHSABS 0.4* 07/13/2011 1200   MONOABS 0.1 07/13/2011 1200   EOSABS 0.0 07/13/2011 1200   BASOSABS 0.0 07/13/2011 1200      Component Value Date/Time   PHART 7.388 07/16/2011 0530   HCO3 49.6* 07/16/2011 0530   TCO2 52.2 07/16/2011 0530   O2SAT 73.6 07/16/2011 0530   RADIOLOGIC DATA 11/29 CXR>>>bilateral airspace disease (c/w Pulm edema), small bilateral effusions  ASSESSMENT/PLAN:  Diastolic CHF / HTN -worsening edema over past week prior to admit.   PLAN: - Continue negative balance with lasix. - Crestor - ASA/Plavix. - BP control per primary.  COPD / OSA / Cor Pulmonale.  Likely decompensated CHF.  No evidence of infection at present.  No evidence of acute bronchospasm, so would focus therapy on CHF and maintain him on home meds.  Acute on chronic respiratory acidosis since patient thought the BiPAP was leaking and blocked the exhalation valve but mentating well.  Accept a pH of 2.73 for now until patient equilibrate properly and will order one for AM.  PLAN:  - Hold advair (  prior notes from Dr. Maple Hudson state that this was not effective). - Spiriva. - Would hold additional diureses for renal function. - D/C BiPAP. - Hold abx for now, follow fever curve. - No indication for steroids at present. - O2 to keep sats 88-93%.  DM PLAN - Per primary svc.  A/CRF: patient approached dry weight, would hold additional diureses for now and f/u a BMET in AM.  Koren Bound MD Pager [phone]

## 2011-07-18 DIAGNOSIS — J4489 Other specified chronic obstructive pulmonary disease: Secondary | ICD-10-CM

## 2011-07-18 DIAGNOSIS — J810 Acute pulmonary edema: Secondary | ICD-10-CM

## 2011-07-18 DIAGNOSIS — J9610 Chronic respiratory failure, unspecified whether with hypoxia or hypercapnia: Secondary | ICD-10-CM

## 2011-07-18 DIAGNOSIS — I509 Heart failure, unspecified: Secondary | ICD-10-CM

## 2011-07-18 DIAGNOSIS — J449 Chronic obstructive pulmonary disease, unspecified: Secondary | ICD-10-CM

## 2011-07-18 LAB — GLUCOSE, CAPILLARY
Glucose-Capillary: 101 mg/dL — ABNORMAL HIGH (ref 70–99)
Glucose-Capillary: 150 mg/dL — ABNORMAL HIGH (ref 70–99)
Glucose-Capillary: 242 mg/dL — ABNORMAL HIGH (ref 70–99)
Glucose-Capillary: 170 mg/dL — ABNORMAL HIGH (ref 70–99)

## 2011-07-18 LAB — BASIC METABOLIC PANEL WITH GFR
BUN: 70 mg/dL — ABNORMAL HIGH (ref 6–23)
CO2: >45 meq/L (ref 19–32)
Calcium: 9 mg/dL (ref 8.4–10.5)
Chloride: 87 meq/L — ABNORMAL LOW (ref 96–112)
Creatinine, Ser: 2.03 mg/dL — ABNORMAL HIGH (ref 0.50–1.35)
GFR calc Af Amer: 40 mL/min — ABNORMAL LOW
GFR calc non Af Amer: 34 mL/min — ABNORMAL LOW
Glucose, Bld: 95 mg/dL (ref 70–99)
Potassium: 4.5 meq/L (ref 3.5–5.1)
Sodium: 144 meq/L (ref 135–145)

## 2011-07-18 LAB — CBC
HCT: 31.9 % — ABNORMAL LOW (ref 39.0–52.0)
Hemoglobin: 8.5 g/dL — ABNORMAL LOW (ref 13.0–17.0)
MCH: 26.4 pg (ref 26.0–34.0)
MCHC: 26.6 g/dL — ABNORMAL LOW (ref 30.0–36.0)
MCV: 99.1 fL (ref 78.0–100.0)
Platelets: 189 K/uL (ref 150–400)
RBC: 3.22 MIL/uL — ABNORMAL LOW (ref 4.22–5.81)
RDW: 14.4 % (ref 11.5–15.5)
WBC: 6.1 K/uL (ref 4.0–10.5)

## 2011-07-18 MED ORDER — FUROSEMIDE 40 MG PO TABS
40.0000 mg | ORAL_TABLET | Freq: Two times a day (BID) | ORAL | Status: DC
  Administered 2011-07-19 – 2011-07-22 (×7): 40 mg via ORAL
  Filled 2011-07-18 (×8): qty 1

## 2011-07-18 NOTE — Progress Notes (Signed)
Report called to [facility] on 4700. Patient transported via bariatric wheelchair

## 2011-07-18 NOTE — Progress Notes (Signed)
Patient ID: [name] [name], male   DOB: [birth year], 60 y.o.   MRN: [id number]   [facility]  Subjective: Feeling better.  He asked if he could bring his own CPAP.  Requests help to get up and ambulate.  Objective: Vital signs in last 24 hours: Temp:  [97.2 F (36.2 C)-98.2 F (36.8 C)] 97.2 F (36.2 C) (12/04 0810) Pulse Rate:  [73-101] 73  (12/04 0500) Resp:  [17-29] 19  (12/04 0500) BP: (96-120)/(52-66) 117/60 mmHg (12/04 0500) SpO2:  [96 %-100 %] 100 % (12/04 0836) FiO2 (%):  [40 %] 40 % (12/04 0412) Weight:  [123.1 kg (271 lb 6.2 oz)] 271 lb 6.2 oz (123.1 kg) (12/04 0204) Last BM Date: 07/14/11  Intake/Output from previous day: 12/03 0701 - 12/04 0700 In: 1546 [P.O.:1500; I.V.:46] Out: 1950 [Urine:1950] Intake/Output this shift: Total I/O In: -  Out: 500 [Urine:500]  Medications Current Facility-Administered Medications  Medication Dose Route Frequency Provider Last Rate Last Dose  . acetaminophen (TYLENOL) tablet 650 mg  650 mg Oral Q4H PRN Abelino Derrick, PA   650 mg at 07/18/11 0204  . albuterol (PROVENTIL HFA;VENTOLIN HFA) 108 (90 BASE) MCG/ACT inhaler 2 puff  2 puff Inhalation Q6H PRN Abelino Derrick, PA      . antiseptic oral rinse (BIOTENE) solution 15 mL  15 mL Mouth Rinse q12n4p Runell Gess, MD   15 mL at 07/17/11 1731  . aspirin EC tablet 81 mg  81 mg Oral Daily Eda Paschal [place], [place]   81 mg at 07/17/11 1017  . chlorhexidine (PERIDEX) 0.12 % solution 15 mL  15 mL Mouth/Throat BID Runell Gess, MD   15 mL at 07/18/11 0753  . clopidogrel (PLAVIX) tablet 75 mg  75 mg Oral Q breakfast Eda Paschal [place], [place]   75 mg at 07/18/11 0753  . colchicine tablet 0.6 mg  0.6 mg Oral Daily Eda Paschal [place], PA   0.6 mg at 07/17/11 1017  . enoxaparin (LOVENOX) injection 40 mg  40 mg Subcutaneous Q24H Eda Paschal [place], [place]   40 mg at 07/17/11 2155  . furosemide (LASIX) tablet 40 mg  40 mg Oral BID Dwana Melena, PA   40 mg at 07/18/11 0753  . insulin aspart  (novoLOG) injection 0-5 Units  0-5 Units Subcutaneous QHS Eda Paschal [place], [place]   2 Units at 07/15/11 2133  . insulin aspart (novoLOG) injection 0-9 Units  0-9 Units Subcutaneous TID WC Abelino Derrick, PA   2 Units at 07/17/11 1730  . metoprolol tartrate (LOPRESSOR) tablet 12.5 mg  12.5 mg Oral BID Eda Paschal [place], [place]   12.5 mg at 07/17/11 2156  . multivitamins ther. w/minerals tablet 1 tablet  1 tablet Oral Daily Eda Paschal [place], [place]   1 tablet at 07/17/11 1000  . nitroGLYCERIN (NITROSTAT) SL tablet 0.4 mg  0.4 mg Sublingual Q5 min PRN Abelino Derrick, PA      . nitroGLYCERIN 0.2 mg/mL in dextrose 5 % infusion  5 mcg/min Intravenous Titrated Abelino Derrick, PA 1.5 mL/hr at 07/17/11 0600 5 mcg/min at 07/17/11 0600  . ondansetron (ZOFRAN) injection 4 mg  4 mg Intravenous Q6H PRN Abelino Derrick, PA      . pantoprazole (PROTONIX) EC tablet 40 mg  40 mg Oral Daily Eda Paschal [place], [place]   40 mg at 07/17/11 1016  . polysaccharide iron (NIFEREX) capsule 150 mg  150 mg Oral BID Abelino Derrick, [place]  150 mg at 07/17/11 2154  . rosuvastatin (CRESTOR) tablet 20 mg  20 mg Oral q1800 Eda Paschal [place], [place]   20 mg at 07/17/11 1731  . sodium chloride 0.9 % injection 3 mL  3 mL Intravenous PRN Abelino Derrick, PA      . Tamsulosin HCl (FLOMAX) capsule 0.4 mg  0.4 mg Oral Daily Eda Paschal [place], PA   0.4 mg at 07/17/11 1016  . tiotropium (SPIRIVA) inhalation capsule 18 mcg  18 mcg Inhalation Daily Max Fickle, MD   18 mcg at 07/18/11 0835  . DISCONTD: furosemide (LASIX) injection 80 mg  80 mg Intravenous BID Eda Paschal [place], [place]   80 mg at 07/17/11 4034   Facility-Administered Medications Ordered in Other Encounters  Medication Dose Route Frequency Provider Last Rate Last Dose  . darbepoetin alfa-polysorbate (ARANESP) injection 40 mcg  40 mcg Subcutaneous Q14 Days Irena Cords, MD   40 mcg at 06/19/11 1345    PE: General appearance: alert, cooperative and no distress Lungs: clear to auscultation bilaterally and BS decreased  bilaterally. Heart: regular rate and rhythm, 2/6 systolic MM. Extremities: Trace edema in the left lower extremity Pulses: 2+ radials  Lab Results:   Basename 07/18/11 0425 07/17/11 1221 07/16/11 0500  WBC 6.1 6.6 6.0  HGB 8.5* 8.3* 8.8*  HCT 31.9* 30.9* 33.9*  PLT 189 195 237   BMET  Basename 07/18/11 0425 07/17/11 1221 07/16/11 0500  NA 144 146* 143  K 4.5 4.2 5.5*  CL 87* 88* 89*  CO2 >45* >45* >45*  GLUCOSE 95 151* 117*  BUN 70* 60* 61*  CREATININE 2.03* 1.86* 2.27*  CALCIUM 9.0 8.9 9.0   Assessment/Plan  Principal Problem:  *Diastolic CHF, acute on chronic Active Problems:  OBESITY HYPOVENTILATION SYNDROME  CORONARY ARTERY DISEASE  COR PULMONALE  DIASTOLIC DYSFUNCTION  C O P D  RESPIRATORY FAILURE, CHRONIC  SLEEP APNEA  PVD (peripheral vascular disease)  Chronic renal insufficiency, stage III (moderate)  Diabetes mellitus  HTN (hypertension)  Anemia  Hyperkalemia  Resolved  Plan:  BP and HR stable.  Cr slightly increased from yesterday.  Holding today's PM lasix. Already received AM dose which is now 40mg  BID.  Home dose 80mg  BID.   Hgb stable.  Weight on 06/28/11 was 135.4Kg, now 123.1 Kg.  PT evaluation and treatment.  Hyperkalemia resolved.  CO2 consistantly >45.  PCCM helping.  Thanks!!   LOS: 5 days    HAGER,BRYAN W 07/18/2011 8:58 AM   Agree with note written by Jones Skene PAC  Clinically improving. Less SOB. Exam benign. Appears dry. Diuretics adjusted. OK to transfer to tele. OT/PT. Probably home next 24-48 hrs.   Runell Gess 07/18/2011 10:29 AM

## 2011-07-18 NOTE — Progress Notes (Signed)
Trenton Verne C. Ellis Koffler, MD Attending Cardiologist [facility]  

## 2011-07-18 NOTE — Progress Notes (Signed)
Patient to be transferred to unit 4700. Attempted to call report charge nurse to rearrange assignment and will return the call.

## 2011-07-18 NOTE — Progress Notes (Signed)
UR Completed.  Denis Carreon Jane [phone] 07/18/2011  

## 2011-07-18 NOTE — Progress Notes (Signed)
CRITICAL VALUE ALERT  Critical value received:  CO2>45  Date of notification: 07/18/2011  Time of notification:6:39 AM  Critical value read back:yes  Nurse who received alert:  Arcenia Scarbro, Jacqulynn Cadet  MD notified (1st page):  Time of first page:   MD notified (2nd page):  Time of second page:  Responding MD:    Time MD responded:    Result consistent with previous results.

## 2011-07-18 NOTE — Progress Notes (Signed)
PCCM Consult Note:  HISTORY of PRESENT ILLNESS:  [name] [name] is a 60 y.o. 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  Denies fevers, chills, n/v/d, chest pain.  ABG in ER demonstrates ph 7.35 / cO2 90 / pO2 80.  PCCM consulted for OSA / COPD mgmt.   CULTURES 11/29 Sputum>>>Unable to produced  ABX None  BEST PRACITICE GI: not indicated DVT: lovenox Nutrition:  Diet per primary.  KEY EVENTS / STUDIES  Blood pressure 127/55, pulse 90, temperature 97.2 F (36.2 C), temperature source Axillary, resp. rate 13, height 6\' 1"  (1.854 m), weight 123.1 kg (271 lb 6.2 oz), SpO2 100.00%.  Intake/Output Summary (Last 24 hours) at 07/18/11 1121 Last data filed at 07/18/11 0946  Gross per 24 hour  Intake   1140 ml  Output   1700 ml  Net   -560 ml   PHYICAL EXAM: General:  Obese male, alert and oriented, follows all commands. Neuro: Alert and oriented, moving all ext to command. CV: Nl S1/S2, RRR, 2-3/6 SEM PULM: non-labored, lungs bilaterally diminished in bases RU:EAVWU/JWJX, bsx4 active Extremities: warm/dry, significant pitting edema noted in legs bilaterally Derm: no rash or skin breakdown Neck: thick, no masses noted HEENT: PERRL, EOMi, OP clear, NCAT  LABS BMET    Component Value Date/Time   NA 144 07/18/2011 0425   K 4.5 07/18/2011 0425   CL 87* 07/18/2011 0425   CO2 >45* 07/18/2011 0425   GLUCOSE 95 07/18/2011 0425   BUN 70* 07/18/2011 0425   CREATININE 2.03* 07/18/2011 0425   CALCIUM 9.0 07/18/2011 0425   CALCIUM 8.5 12/10/2010 0400   GFRNONAA 34* 07/18/2011 0425   GFRAA 40* 07/18/2011  0425   CBC    Component Value Date/Time   WBC 6.1 07/18/2011 0425   RBC 3.22* 07/18/2011 0425   HGB 8.5* 07/18/2011 0425   HCT 31.9* 07/18/2011 0425   PLT 189 07/18/2011 0425   MCV 99.1 07/18/2011 0425   MCH 26.4 07/18/2011 0425   MCHC 26.6* 07/18/2011 0425   RDW 14.4 07/18/2011 0425   LYMPHSABS 0.4* 07/13/2011 1200   MONOABS 0.1 07/13/2011 1200   EOSABS 0.0 07/13/2011 1200   BASOSABS 0.0 07/13/2011 1200      Component Value Date/Time   PHART 7.388 07/16/2011 0530   HCO3 49.6* 07/16/2011 0530   TCO2 52.2 07/16/2011 0530   O2SAT 73.6 07/16/2011 0530   RADIOLOGIC DATA 11/29 CXR>>>bilateral airspace disease (c/w Pulm edema), small bilateral effusions  ASSESSMENT/PLAN:  Diastolic CHF/HTN - worsening edema over past week prior to admit.   PLAN: - Continue negative balance with lasix per cards. - Crestor. - ASA/Plavix. - BP control per primary.  COPD / OSA / Cor Pulmonale.  Likely decompensated CHF.  No evidence of infection at present.  No evidence of acute bronchospasm, so would focus therapy on CHF and maintain him on home meds.  Acute on chronic respiratory acidosis since patient thought the BiPAP was leaking and blocked the exhalation valve but mentating well.  Accept a pH of 2.73 for now until patient equilibrate properly and will order one for AM.  PLAN:  -  Hold advair (prior notes from Dr. Maple Hudson state that this was not effective). - Spiriva. - Diureses per primary. - D/C BiPAP. - No need for abx. - No indication for steroids at present. - O2 to keep sats 88-93%.  DM PLAN - Per primary svc.  At this point, little for PCCM to offer, will sign off, please call back if needed and arrange for F/U with [facility] 2 wks post discharge.  Please call back if needed.Marland Kitchen  Koren Bound MD Pager [phone]

## 2011-07-19 LAB — GLUCOSE, CAPILLARY
Glucose-Capillary: 107 mg/dL — ABNORMAL HIGH (ref 70–99)
Glucose-Capillary: 140 mg/dL — ABNORMAL HIGH (ref 70–99)
Glucose-Capillary: 110 mg/dL — ABNORMAL HIGH (ref 70–99)
Glucose-Capillary: 153 mg/dL — ABNORMAL HIGH (ref 70–99)

## 2011-07-19 LAB — CBC
HCT: 31.9 % — ABNORMAL LOW (ref 39.0–52.0)
Hemoglobin: 8.6 g/dL — ABNORMAL LOW (ref 13.0–17.0)
MCH: 26.3 pg (ref 26.0–34.0)
MCHC: 27 g/dL — ABNORMAL LOW (ref 30.0–36.0)
MCV: 97.6 fL (ref 78.0–100.0)

## 2011-07-19 LAB — BASIC METABOLIC PANEL
BUN: 70 mg/dL — ABNORMAL HIGH (ref 6–23)
CO2: >45 mEq/L (ref 19–32)
Chloride: 86 mEq/L — ABNORMAL LOW (ref 96–112)
Creatinine, Ser: 1.85 mg/dL — ABNORMAL HIGH (ref 0.50–1.35)
Potassium: 4.6 mEq/L (ref 3.5–5.1)

## 2011-07-19 NOTE — Progress Notes (Signed)
Physical Therapy Evaluation Patient Details Name: [name] [name] MRN: [id number] DOB: [birth year] Today's Date: 07/19/2011  Problem List:  Patient Active Problem List  Diagnoses  . OBESITY HYPOVENTILATION SYNDROME  . CORONARY ARTERY DISEASE  . COR PULMONALE  . DIASTOLIC DYSFUNCTION  . C O P D  . RESPIRATORY FAILURE, CHRONIC  . SLEEP APNEA  . ANGIOEDEMA  . PVD (peripheral vascular disease)  . Chronic renal insufficiency, stage III (moderate)  . Diabetes mellitus  . HTN (hypertension)  . Diastolic CHF, acute on chronic  . Anemia    Past Medical History:  Past Medical History  Diagnosis Date  . Heart disease, unspecified   . Coronary atherosclerosis of unspecified type of vessel, native or graft   . Chronic pulmonary heart disease, unspecified   . Other hyperalimentation   . Chronic respiratory failure   . Unspecified sleep apnea   . Chronic airway obstruction, not elsewhere classified   . Angina   . Shortness of breath   . Anemia   . Chronic kidney disease   . Anxiety   . Hypertension   . CHF (congestive heart failure)   . Blood transfusion     no reaction from transfusion  . Arthritis     hx of gout  . Diabetes mellitus   . CORONARY ARTERY DISEASE 10/28/2007   Past Surgical History:  Past Surgical History  Procedure Date  . Coronary artery bypass graft   . Appendectomy   . Bilateral vats ablation   . Coronary angioplasty with stent placement     PT Assessment/Plan/Recommendation PT Assessment Clinical Impression Statement: Pt presented to therapy with decreased mobility and general deconditioning.  Pt is a pleasant gentleman who was very grateful to PT for coming to get him up and walk with him.  Sp O2 was 94% with 3 L O2 in sitting.  After ambulation 40', SpO2 was 92%.  Upon ambulation another 65' back to the room, SpO2 was 87%.  Pt was instructed in breathing technique and SpO2 returned to 94%.  Pt would benefit from PT in the acute setting to maximize  independence and increase functional mobility to prepare for safe d/c home. PT Recommendation/Assessment: Patient will need skilled PT in the acute care venue PT Problem List: Decreased strength;Decreased activity tolerance;Decreased balance;Decreased mobility;Decreased knowledge of use of DME;Cardiopulmonary status limiting activity;Obesity Barriers to Discharge: None PT Therapy Diagnosis : Difficulty walking;Abnormality of gait;Generalized weakness PT Plan PT Frequency: Min 3X/week PT Treatment/Interventions: DME instruction;Gait training;Stair training;Functional mobility training;Therapeutic activities;Therapeutic exercise;Balance training;Patient/family education PT Recommendation Follow Up Recommendations: Home health PT Equipment Recommended: Defer to next venue PT Goals  Acute Rehab PT Goals PT Goal Formulation: With patient Time For Goal Achievement: 7 days Pt will go Supine/Side to Sit: with modified independence PT Goal: Supine/Side to Sit - Progress: Progressing toward goal Pt will go Sit to Stand: with modified independence PT Goal: Sit to Stand - Progress: Progressing toward goal Pt will go Stand to Sit: with modified independence PT Goal: Stand to Sit - Progress: Progressing toward goal Pt will Transfer Bed to Chair/Chair to Bed: with modified independence PT Transfer Goal: Bed to Chair/Chair to Bed - Progress: Progressing toward goal Pt will Ambulate: >150 feet;with modified independence;with least restrictive assistive device;with gait velocity >(comment) ft/second (>1.8 ft/sec to decrease fall risk) PT Goal: Ambulate - Progress: Progressing toward goal  PT Evaluation Precautions/Restrictions  Precautions Precautions: Fall Required Braces or Orthoses: No Restrictions Weight Bearing Restrictions: No Prior Functioning  Home Living Lives With:  Spouse Type of Home: Apartment Home Layout: One level Home Access: Level entry Bathroom Shower/Tub: Architectural technologist: Handicapped height Bathroom Accessibility: Yes How Accessible: Accessible via wheelchair Home Adaptive Equipment: Grab bars around toilet;Grab bars in shower;Raised toilet seat with rails;Walker - rolling Prior Function Level of Independence: Independent with homemaking with ambulation;Independent with basic ADLs;Independent with transfers;Requires assistive device for independence Able to Take Stairs?: Yes Driving: Yes Vocation: On disability Leisure: Hobbies-yes (Comment) Comments: Pt enjoys playing golf and bowling, and would like to get back to both of those. Cognition Cognition Arousal/Alertness: Awake/alert Overall Cognitive Status: Appears within functional limits for tasks assessed Orientation Level: Oriented X4 Sensation/Coordination   Extremity Assessment RUE Assessment RUE Assessment: Within Functional Limits LUE Assessment LUE Assessment: Within Functional Limits RLE Assessment RLE Assessment: Within Functional Limits LLE Assessment LLE Assessment: Within Functional Limits Mobility (including Balance) Bed Mobility Bed Mobility: No Transfers Transfers: Yes Sit to Stand: 5: Supervision;From elevated surface;With upper extremity assist;From chair/3-in-1;With armrests Sit to Stand Details (indicate cue type and reason): Supervision for pt safety.  Verbal cues for hand placement. Stand to Sit: 4: Min assist;With upper extremity assist;With armrests;To chair/3-in-1 Stand to Sit Details: Min A for controlled descent and pt safety.  Verbal cues for hand placement on chair. Ambulation/Gait Ambulation/Gait: Yes Ambulation/Gait Assistance: 4: Min assist Ambulation/Gait Assistance Details (indicate cue type and reason): Min A for balance and pt safety. Ambulation Distance (Feet): 80 Feet Assistive device: Rolling walker Gait Pattern: Step-to pattern;Decreased stride length;Antalgic Gait velocity: Decreased Stairs: No Wheelchair Mobility Wheelchair  Mobility: No  Posture/Postural Control Posture/Postural Control: No significant limitations Balance Balance Assessed: Yes Static Standing Balance Static Standing - Balance Support: Bilateral upper extremity supported Static Standing - Level of Assistance: 5: Stand by assistance Static Standing - Comment/# of Minutes: Pt stood for approximately 5 minutes in the hall while his SpO2 was read and his doctor spoke with him. Exercise    End of Session PT - End of Session Equipment Utilized During Treatment: Gait belt;Other (comment) (RW, 3 L O2) Activity Tolerance: Patient tolerated treatment well Patient left: in chair;with call bell in reach;with family/visitor present Nurse Communication: Mobility status for transfers;Mobility status for ambulation General Behavior During Session: [facility] for tasks performed Cognition: [facility] for tasks performed  Lacinda Axon 07/19/2011, 10:32 AM  Jake Shark, PT DPT [phone]

## 2011-07-19 NOTE — Progress Notes (Signed)
CRITICAL VALUE ALERT  Critical value received: CO2 >45  Date of notification: 07/19/11  Time of notification:  0654am  Critical value read back: yes  Nurse who received alert:  Tish  MD notified (1st page):  Huey Bienenstock, PA  Time of first page:  0815  MD notified (2nd page):  Time of second page:  Responding MD:  Huey Bienenstock, PA  Time MD responded:  [phone]

## 2011-07-19 NOTE — Progress Notes (Signed)
Subjective:  Up with Physical Therapy, using walker.  Objective:  Vital Signs in the last 24 hours: Temp:  [98.1 F (36.7 C)-98.7 F (37.1 C)] 98.1 F (36.7 C) (12/05 0605) Pulse Rate:  [69-90] 81  (12/05 0605) Resp:  [13-81] 19  (12/05 0605) BP: (90-133)/(55-78) 126/78 mmHg (12/05 0605) SpO2:  [92 %-100 %] 95 % (12/05 0605) Weight:  [124.104 kg (273 lb 9.6 oz)] 273 lb 9.6 oz (124.104 kg) (12/05 0605)  Intake/Output from previous day: 12/04 0701 - 12/05 0700 In: 240 [P.O.:240] Out: 2100 [Urine:2100]  Physical Exam: General appearance: alert, cooperative and no distress Lungs: clear but overall decreased breath sounds Heart: regular rate and rhythm, S1, S2 normal, no murmur, click, rub or gallop Extremities: trace edema bilat   Rate: 80  Rhythm: normal sinus rhythm  Lab Results:  Basename 07/19/11 0520 07/18/11 0425  WBC 5.1 6.1  HGB 8.6* 8.5*  PLT 191 189    Basename 07/19/11 0520 07/18/11 0425  NA 142 144  K 4.6 4.5  CL 86* 87*  CO2 >45* >45*  GLUCOSE 106* 95  BUN 70* 70*  CREATININE 1.85* 2.03*   No results found for this basename: TROPONINI:2,CK,MB:2 in the last 72 hours Hepatic Function Panel No results found for this basename: PROT,ALBUMIN,AST,ALT,ALKPHOS,BILITOT,BILIDIR,IBILI in the last 72 hours No results found for this basename: CHOL in the last 72 hours No results found for this basename: INR in the last 72 hours  Imaging: No results found.  Cardiac Studies:  Assessment/Plan:   Principal Problem:  *Diastolic CHF, acute on chronic Active Problems:  RESPIRATORY FAILURE, CHRONIC  DIASTOLIC DYSFUNCTION  Chronic renal insufficiency, stage III (moderate)  OBESITY HYPOVENTILATION SYNDROME  CORONARY ARTERY DISEASE  COR PULMONALE  C O P D  SLEEP APNEA  PVD (peripheral vascular disease)  Diabetes mellitus  HTN (hypertension)  Anemia   Plan-Case manager consult. He may be ready for discharge in am pending labs.   [name]  PA-C 07/19/2011, 8:58 AM    I have seen and examined the patient along with [name], PA.  I have reviewed the chart, notes and new data.  I agree with PA/NP's note.  Key new complaints: Alert. Feeling stronger. No dyspnea when walking Key examination changes: Still with significant bilateral pedal edema Key new findings / data: creat improved since yesterday  PLAN: Possible dc in AM but still needs gradual diuresis. 8 lb down since admission, probably 5-8 left to go, predominantly right heart failure/cor pulmonale. Reinforced daily weight monitoring, sodium restriction, signs and sxs of CHF exacerbation.  Junious Silk, MD, [facility] [phone] 07/19/2011, 12:53 PM

## 2011-07-19 NOTE — Progress Notes (Signed)
07/19/11 0815 Nursing Note: MD paged and made aware CO2>45. No orders received at this time. Will continue to monitor pt. George Hugh RN

## 2011-07-19 NOTE — Progress Notes (Signed)
   CARE MANAGEMENT NOTE 07/19/2011  Patient:  [name] [name], [name] [name]   Account Number:  [id number]  Date Initiated:  07/19/2011  Documentation initiated by:  Shannan Harper  Subjective/Objective Assessment:   Patient admitted due to increased weakness and somulence, found to be in CHF on admit, has CHF history     Action/Plan:   Discharge to home with [facility] and CHF disease mgmt.   Anticipated DC Date:  07/20/2011   Anticipated DC Plan:  HOME W HOME HEALTH SERVICES      DC Planning Services  CM consult      PAC Choice  DURABLE MEDICAL EQUIPMENT  HOME HEALTH   Choice offered to / List presented to:  C-1 Patient   DME arranged  BIPAP  OXYGEN      DME agency  [facility]     [facility] arranged  HH-1 RN  HH-10 DISEASE MANAGEMENT      HH agency  [facility]   Status of service:  In process, will continue to follow Medicare Important Message given?   (If response is "NO", the following Medicare IM given date fields will be blank) Date Medicare IM given:   Date Additional Medicare IM given:    Discharge Disposition:    Per UR Regulation:  Reviewed for med. necessity/level of care/duration of stay  Comments:  Heart Failure Patient:  Received referral to set up home oxygen and bipap.  Patient already has bipap through [facility].  He agreed with oxygen through [facility] as well as [facility]N for CHF disease management. Referral placed with Hilda Lias, RN with [facility].  Patient did not have heart failure packet at bedside.  Obtained and we discussed at length zones as that was new for him.  Will continue to assist.  07/19/11 1236 Shannan Harper, RN, BSN

## 2011-07-20 LAB — GLUCOSE, CAPILLARY
Glucose-Capillary: 128 mg/dL — ABNORMAL HIGH (ref 70–99)
Glucose-Capillary: 147 mg/dL — ABNORMAL HIGH (ref 70–99)
Glucose-Capillary: 123 mg/dL — ABNORMAL HIGH (ref 70–99)

## 2011-07-20 LAB — CBC
HCT: 27.4 % — ABNORMAL LOW (ref 39.0–52.0)
Hemoglobin: 7.6 g/dL — ABNORMAL LOW (ref 13.0–17.0)
MCH: 26.9 pg (ref 26.0–34.0)
MCHC: 27.7 g/dL — ABNORMAL LOW (ref 30.0–36.0)
MCV: 96.8 fL (ref 78.0–100.0)
Platelets: 152 10*3/uL (ref 150–400)
RBC: 2.83 MIL/uL — ABNORMAL LOW (ref 4.22–5.81)
RDW: 14 % (ref 11.5–15.5)
WBC: 4.4 10*3/uL (ref 4.0–10.5)

## 2011-07-20 LAB — BASIC METABOLIC PANEL
BUN: 70 mg/dL — ABNORMAL HIGH (ref 6–23)
CO2: >45 mEq/L (ref 19–32)
Calcium: 9.2 mg/dL (ref 8.4–10.5)
Chloride: 87 mEq/L — ABNORMAL LOW (ref 96–112)
Creatinine, Ser: 1.79 mg/dL — ABNORMAL HIGH (ref 0.50–1.35)
GFR calc Af Amer: 46 mL/min — ABNORMAL LOW (ref >90)
GFR calc non Af Amer: 40 mL/min — ABNORMAL LOW (ref >90)
Glucose, Bld: 96 mg/dL (ref 70–99)
Potassium: 5 mEq/L (ref 3.5–5.1)
Sodium: 142 mEq/L (ref 135–145)

## 2011-07-20 LAB — OCCULT BLOOD X 1 CARD TO LAB, STOOL: Fecal Occult Bld: POSITIVE

## 2011-07-20 LAB — PREPARE RBC (CROSSMATCH)

## 2011-07-20 NOTE — Progress Notes (Signed)
Subjective:  Up in chair. Alert, feels a [name] [name] stronger.  Objective:  Vital Signs in the last 24 hours: Temp:  [97.7 F (36.5 C)-98.7 F (37.1 C)] 98.4 F (36.9 C) (12/06 0415) Pulse Rate:  [68-89] 80  (12/06 0415) Resp:  [16-22] 16  (12/06 0415) BP: (96-133)/(58-69) 115/66 mmHg (12/06 0415) SpO2:  [87 %-100 %] 98 % (12/06 0415)  Intake/Output from previous day:  Intake/Output Summary (Last 24 hours) at 07/20/11 0824 Last data filed at 07/20/11 0648  Gross per 24 hour  Intake   1220 ml  Output   2525 ml  Net  -1305 ml    Physical Exam: General appearance: alert, cooperative and no distress Lungs: breath sounds markedly decreased bilat Heart: regular rate and rhythm, S1, S2 normal, no murmur, click, rub or gallop soft syt murmur LSB Extrem: 1+ bilat LE edema   Rate: 80   Rhythm: normal sinus rhythm  Lab Results:  Basename 07/20/11 0645 07/19/11 0520  WBC 4.4 5.1  HGB 7.6* 8.6*  PLT 152 191    Basename 07/19/11 0520 07/18/11 0425  NA 142 144  K 4.6 4.5  CL 86* 87*  CO2 >45* >45*  GLUCOSE 106* 95  BUN 70* 70*  CREATININE 1.85* 2.03*   No results found for this basename: TROPONINI:2,CK,MB:2 in the last 72 hours Hepatic Function Panel No results found for this basename: PROT,ALBUMIN,AST,ALT,ALKPHOS,BILITOT,BILIDIR,IBILI in the last 72 hours No results found for this basename: CHOL in the last 72 hours No results found for this basename: INR in the last 72 hours  Imaging: No results found.  Cardiac Studies:  Assessment/Plan:   Principal Problem:  *Diastolic CHF, acute on chronic, I/O continues neg, wgt unchanged 124kg  Active Problems:  RESPIRATORY FAILURE, CHRONIC  DIASTOLIC DYSFUNCTION  Chronic renal insufficiency, stage III (moderate)  OBESITY HYPOVENTILATION SYNDROME  CORONARY ARTERY DISEASE  COR PULMONALE  C O P D  SLEEP APNEA  PVD (peripheral vascular disease)  Diabetes mellitus  HTN (hypertension)  Anemia, Hgb down to 7.6    Plan-  Type and cross 2 units PRBC, transfuse, check stools. He may not be ready for D/C today.       [name] PA-C 07/20/2011, 8:24 AM  Agree with above Looks much better. Per PT. Breathing at base line. Do not want him to go home with Hgb.7.6. tfu 1 unit prbc

## 2011-07-20 NOTE — Progress Notes (Signed)
Pt's C02 >45.  [place] PA notified.  No new orders.

## 2011-07-21 LAB — TYPE AND SCREEN
ABO/RH(D): O POS
Antibody Screen: NEGATIVE
Unit division: 0

## 2011-07-21 LAB — CBC
HCT: 29.2 % — ABNORMAL LOW (ref 39.0–52.0)
Hemoglobin: 8.1 g/dL — ABNORMAL LOW (ref 13.0–17.0)
MCH: 26.9 pg (ref 26.0–34.0)
MCHC: 27.7 g/dL — ABNORMAL LOW (ref 30.0–36.0)
MCV: 97 fL (ref 78.0–100.0)
Platelets: 124 10*3/uL — ABNORMAL LOW (ref 150–400)
RBC: 3.01 MIL/uL — ABNORMAL LOW (ref 4.22–5.81)
RDW: 14.4 % (ref 11.5–15.5)
WBC: 4.3 10*3/uL (ref 4.0–10.5)

## 2011-07-21 LAB — GLUCOSE, CAPILLARY
Glucose-Capillary: 130 mg/dL — ABNORMAL HIGH (ref 70–99)
Glucose-Capillary: 168 mg/dL — ABNORMAL HIGH (ref 70–99)
Glucose-Capillary: 102 mg/dL — ABNORMAL HIGH (ref 70–99)

## 2011-07-21 LAB — T3, FREE: T3, Free: 2.3 pg/mL (ref 2.3–4.2)

## 2011-07-21 LAB — BASIC METABOLIC PANEL
BUN: 72 mg/dL — ABNORMAL HIGH (ref 6–23)
CO2: >45 mEq/L (ref 19–32)
Calcium: 9 mg/dL (ref 8.4–10.5)
Chloride: 91 mEq/L — ABNORMAL LOW (ref 96–112)
Creatinine, Ser: 1.76 mg/dL — ABNORMAL HIGH (ref 0.50–1.35)
GFR calc Af Amer: 47 mL/min — ABNORMAL LOW (ref >90)
GFR calc non Af Amer: 41 mL/min — ABNORMAL LOW (ref >90)
Glucose, Bld: 101 mg/dL — ABNORMAL HIGH (ref 70–99)
Potassium: 5.3 mEq/L — ABNORMAL HIGH (ref 3.5–5.1)
Sodium: 146 mEq/L — ABNORMAL HIGH (ref 135–145)

## 2011-07-21 MED ORDER — PANTOPRAZOLE SODIUM 40 MG PO TBEC
40.0000 mg | DELAYED_RELEASE_TABLET | Freq: Two times a day (BID) | ORAL | Status: DC
  Administered 2011-07-21 – 2011-07-22 (×2): 40 mg via ORAL
  Filled 2011-07-21 (×2): qty 1

## 2011-07-21 NOTE — Consult Note (Signed)
[facility] Consult Note  Referring Provider: No ref. provider found Primary Care Physician:  Altamese [place], MD Primary Gastroenterologist:  Dr.  Antony Contras Complaint: Anemia and heme positive stools HPI: [name] [name] is an 60 y.o. black male.  Male admitted with coronary artery disease and congestive heart failure, also found to be anemic with a ventral drop in hemoglobin to 7.6 with guaiac positive stool. The patient states he has noticed a little rectal bleeding since admission but not before. He has some vague indigestion and gas but no dysphagia nausea vomiting early satiety significant abdominal pain black stools or change in stool caliber. He has never had a colonoscopy. He has never had peptic ulcer disease. He has no family history of GI malignancy.  Past Medical History  Diagnosis Date  . Heart disease, unspecified   . Coronary atherosclerosis of unspecified type of vessel, native or graft   . Chronic pulmonary heart disease, unspecified   . Other hyperalimentation   . Chronic respiratory failure   . Unspecified sleep apnea   . Chronic airway obstruction, not elsewhere classified   . Angina   . Shortness of breath   . Anemia   . Chronic kidney disease   . Anxiety   . Hypertension   . CHF (congestive heart failure)   . Blood transfusion     no reaction from transfusion  . Arthritis     hx of gout  . Diabetes mellitus   . CORONARY ARTERY DISEASE 10/28/2007    Past Surgical History  Procedure Date  . Coronary artery bypass graft   . Appendectomy   . Bilateral vats ablation   . Coronary angioplasty with stent placement     Medications Prior to Admission  Medication Dose Route Frequency Provider Last Rate Last Dose  . 0.9 %  sodium chloride infusion   Intravenous Once Scarlette Calico C. [place], [place] 10 mL/hr at 07/13/11 3086    . acetaminophen (TYLENOL) tablet 650 mg  650 mg Oral Q4H PRN Abelino Derrick, PA   650 mg at 07/18/11 0204  . albuterol (PROVENTIL  HFA;VENTOLIN HFA) 108 (90 BASE) MCG/ACT inhaler 2 puff  2 puff Inhalation Q6H PRN Abelino Derrick, PA      . albuterol (PROVENTIL) (5 MG/ML) 0.5% nebulizer solution 2.5 mg  2.5 mg Nebulization Once Scarlette Calico C. [place], PA   2.5 mg at 07/13/11 0744  . antiseptic oral rinse (BIOTENE) solution 15 mL  15 mL Mouth Rinse q12n4p Runell Gess, MD   15 mL at 07/20/11 1635  . aspirin chewable tablet 324 mg  324 mg Oral NOW Abelino Derrick, [place]       Or  . aspirin suppository 300 mg  300 mg Rectal NOW Abelino Derrick, PA      . aspirin EC tablet 81 mg  81 mg Oral Daily Eda Paschal [place], [place]   81 mg at 07/21/11 0948  . chlorhexidine (PERIDEX) 0.12 % solution 15 mL  15 mL Mouth/Throat BID Runell Gess, MD   15 mL at 07/21/11 0948  . clopidogrel (PLAVIX) tablet 75 mg  75 mg Oral Q breakfast Eda Paschal [place], [place]   75 mg at 07/21/11 0948  . colchicine tablet 0.6 mg  0.6 mg Oral Daily Eda Paschal [place], [place]   0.6 mg at 07/21/11 0948  . darbepoetin alfa-polysorbate (ARANESP) injection 40 mcg  40 mcg Subcutaneous Q14 Days Irena Cords, MD   40 mcg at 06/19/11 1345  . enoxaparin (LOVENOX)  injection 40 mg  40 mg Subcutaneous Q24H Eda Paschal [place], [place]   40 mg at 07/19/11 2141  . furosemide (LASIX) injection 80 mg  80 mg Intravenous Once Scarlette Calico C. [place], PA   80 mg at 07/13/11 0953  . furosemide (LASIX) tablet 40 mg  40 mg Oral BID Runell Gess, MD   40 mg at 07/21/11 0948  . insulin aspart (novoLOG) injection 0-5 Units  0-5 Units Subcutaneous QHS Eda Paschal [place], [place]   2 Units at 07/15/11 2133  . insulin aspart (novoLOG) injection 0-9 Units  0-9 Units Subcutaneous TID WC Eda Paschal [place], PA   1 Units at 07/20/11 1708  . ipratropium (ATROVENT) nebulizer solution 0.5 mg  0.5 mg Nebulization Once Scarlette Calico C. [place], PA   0.5 mg at 07/13/11 0744  . methylPREDNISolone sodium succinate (SOLU-MEDROL) 125 MG injection 125 mg  125 mg Intravenous Once Scarlette Calico C. [place], [place]   125 mg at 07/13/11 0841  . metoprolol tartrate (LOPRESSOR)  tablet 12.5 mg  12.5 mg Oral BID Eda Paschal [place], PA   12.5 mg at 07/21/11 0948  . multivitamins ther. w/minerals tablet 1 tablet  1 tablet Oral Daily Eda Paschal [place], [place]   1 tablet at 07/21/11 1000  . nitroGLYCERIN (NITROSTAT) SL tablet 0.4 mg  0.4 mg Sublingual Q5 min PRN Abelino Derrick, PA      . nitroGLYCERIN 0.2 mg/mL in dextrose 5 % infusion  5 mcg/min Intravenous Once Scarlette Calico C. [place], PA 1.5 mL/hr at 07/13/11 0956 5 mcg/min at 07/13/11 0956  . ondansetron (ZOFRAN) injection 4 mg  4 mg Intravenous Q6H PRN Abelino Derrick, PA      . pantoprazole (PROTONIX) EC tablet 40 mg  40 mg Oral BID AC Luke K [place], [place]      . polysaccharide iron (NIFEREX) capsule 150 mg  150 mg Oral BID Eda Paschal [place], PA   150 mg at 07/21/11 0948  . rosuvastatin (CRESTOR) tablet 20 mg  20 mg Oral q1800 Eda Paschal [place], [place]   20 mg at 07/20/11 1707  . sodium chloride 0.9 % injection 3 mL  3 mL Intravenous PRN Abelino Derrick, PA      . Tamsulosin HCl (FLOMAX) capsule 0.4 mg  0.4 mg Oral Daily Eda Paschal [place], [place]   0.4 mg at 07/21/11 0948  . tiotropium (SPIRIVA) inhalation capsule 18 mcg  18 mcg Inhalation Daily Max Fickle, MD   18 mcg at 07/21/11 0931  . DISCONTD: Fluticasone-Salmeterol (ADVAIR) 250-50 MCG/DOSE inhaler 1 puff  1 puff Inhalation Q12H Eda Paschal [place], [place]      . DISCONTD: furosemide (LASIX) injection 80 mg  80 mg Intravenous BID Eda Paschal [place], [place]   80 mg at 07/17/11 0803  . DISCONTD: furosemide (LASIX) tablet 40 mg  40 mg Oral BID Dwana Melena, PA   40 mg at 07/18/11 0753  . DISCONTD: multivitamin capsule 1 capsule  1 capsule Oral Daily Eda Paschal [place], [place]      . DISCONTD: nitroGLYCERIN 0.2 mg/mL in dextrose 5 % infusion  5 mcg/min Intravenous Titrated Abelino Derrick, PA 1.5 mL/hr at 07/17/11 0600 5 mcg/min at 07/17/11 0600  . DISCONTD: pantoprazole (PROTONIX) EC tablet 40 mg  40 mg Oral Daily Eda Paschal [place], [place]   40 mg at 07/20/11 1026   Medications Prior to Admission  Medication Sig Dispense Refill  . albuterol  (PROAIR HFA) 108 (90 BASE) MCG/ACT inhaler Inhale 2 puffs into the lungs every  6 (six) hours as needed.        Marland Kitchen aspirin 81 MG tablet Take 81 mg by mouth daily.        . clopidogrel (PLAVIX) 75 MG tablet Take 75 mg by mouth daily.        Marland Kitchen COLCRYS 0.6 MG tablet Take 1 tablet by mouth daily.      . diazepam (VALIUM) 5 MG tablet Take 5 mg by mouth at bedtime as needed.        . furosemide (LASIX) 80 MG tablet Take 80 mg by mouth 2 (two) times daily.        Marland Kitchen HYDROcodone-acetaminophen (VICODIN) 5-500 MG per tablet Take 1 tablet by mouth Every 4 hours as needed.      . metoprolol tartrate (LOPRESSOR) 25 MG tablet Take 12.5 mg by mouth Twice daily.      . Multiple Vitamin (MULTIVITAMIN) capsule Take 1 capsule by mouth daily.        . pantoprazole (PROTONIX) 40 MG tablet Take 1 tablet by mouth daily.      . potassium chloride SA (K-DUR,KLOR-CON) 20 MEQ tablet Take 20 mEq by mouth 2 (two) times daily.        . rosuvastatin (CRESTOR) 20 MG tablet Take 20 mg by mouth daily.        . Tamsulosin HCl (FLOMAX) 0.4 MG CAPS Take 1 tablet by mouth daily.      . metFORMIN (GLUCOPHAGE-XR) 750 MG 24 hr tablet Take 750 mg by mouth daily with breakfast.        . nitroGLYCERIN (NITROSTAT) 0.4 MG SL tablet Place 0.4 mg under the tongue every 5 (five) minutes as needed.          Allergies:  Allergies  Allergen Reactions  . Amlodipine Besy-Benazepril Hcl     REACTION: mouth edema  . Percocet (Oxycodone-Acetaminophen) Other (See Comments)    hallucinations    Family History  Problem Relation Age of Onset  . Diabetes Sister     Social History:  reports that he quit smoking about 12 years ago. He has never used smokeless tobacco. He reports that he drinks alcohol. He reports that he does not use illicit drugs.  Negative except for the above   Blood pressure 105/54, pulse 78, temperature 98.7 F (37.1 C), temperature source Oral, resp. rate 18, height 6\' 1"  (1.854 m), weight 123.4 kg (272 lb 0.8 oz), SpO2  97.00%. Head: Normocephalic, without obvious abnormality, atraumatic Neck: no adenopathy, no carotid bruit, no JVD, supple, symmetrical, trachea midline and thyroid not enlarged, symmetric, no tenderness/mass/nodules Resp: clear to auscultation bilaterally Cardio: regular rate and rhythm, S1, S2 normal, no murmur, click, rub or gallop GI: Abdomen soft nondistended with normoactive bowel sounds no hepatomegaly masses or guarding Extremities: extremities normal, atraumatic, no cyanosis or edema  Results for orders placed during the hospital encounter of 07/13/11 (from the past 48 hour(s))  GLUCOSE, CAPILLARY     Status: Abnormal   Collection Time   07/19/11  4:39 PM      Component Value Range Comment   Glucose-Capillary 110 (*) 70 - 99 (mg/dL)    Comment 1 Documented in Chart      Comment 2 Notify RN     GLUCOSE, CAPILLARY     Status: Abnormal   Collection Time   07/19/11  8:36 PM      Component Value Range Comment   Glucose-Capillary 153 (*) 70 - 99 (mg/dL)   BASIC METABOLIC PANEL  Status: Abnormal   Collection Time   07/20/11  6:45 AM      Component Value Range Comment   Sodium 142  135 - 145 (mEq/L)    Potassium 5.0  3.5 - 5.1 (mEq/L)    Chloride 87 (*) 96 - 112 (mEq/L)    CO2 >45 (*) 19 - 32 (mEq/L)    Glucose, Bld 96  70 - 99 (mg/dL)    BUN 70 (*) 6 - 23 (mg/dL)    Creatinine, Ser 1.61 (*) 0.50 - 1.35 (mg/dL)    Calcium 9.2  8.4 - 10.5 (mg/dL)    GFR calc non Af Amer 40 (*) >90 (mL/min)    GFR calc Af Amer 46 (*) >90 (mL/min)   CBC     Status: Abnormal   Collection Time   07/20/11  6:45 AM      Component Value Range Comment   WBC 4.4  4.0 - 10.5 (K/uL)    RBC 2.83 (*) 4.22 - 5.81 (MIL/uL)    Hemoglobin 7.6 (*) 13.0 - 17.0 (g/dL)    HCT 09.6 (*) 04.5 - 52.0 (%)    MCV 96.8  78.0 - 100.0 (fL)    MCH 26.9  26.0 - 34.0 (pg)    MCHC 27.7 (*) 30.0 - 36.0 (g/dL)    RDW 40.9  81.1 - 91.4 (%)    Platelets 152  150 - 400 (K/uL)   GLUCOSE, CAPILLARY     Status: Abnormal    Collection Time   07/20/11  6:47 AM      Component Value Range Comment   Glucose-Capillary 100 (*) 70 - 99 (mg/dL)    Comment 1 Notify RN      Comment 2 Documented in Chart     GLUCOSE, CAPILLARY     Status: Abnormal   Collection Time   07/20/11 11:28 AM      Component Value Range Comment   Glucose-Capillary 128 (*) 70 - 99 (mg/dL)    Comment 1 Notify RN      Comment 2 Documented in Chart     PREPARE RBC (CROSSMATCH)     Status: Normal   Collection Time   07/20/11 12:00 PM      Component Value Range Comment   Order Confirmation ORDER PROCESSED BY BLOOD BANK     TYPE AND SCREEN     Status: Normal   Collection Time   07/20/11 12:00 PM      Component Value Range Comment   ABO/RH(D) O POS      Antibody Screen NEG      Sample Expiration 07/23/2011      Unit Number [id number]      Blood Component Type RED CELLS,LR      Unit division 00      Status of Unit ISSUED,FINAL      Transfusion Status OK TO TRANSFUSE      Crossmatch Result Compatible     GLUCOSE, CAPILLARY     Status: Abnormal   Collection Time   07/20/11  4:46 PM      Component Value Range Comment   Glucose-Capillary 147 (*) 70 - 99 (mg/dL)    Comment 1 Notify RN      Comment 2 Documented in Chart     OCCULT BLOOD X 1 CARD TO LAB, STOOL     Status: Normal   Collection Time   07/20/11  7:38 PM      Component Value Range Comment   Fecal Occult Bld POSITIVE  GLUCOSE, CAPILLARY     Status: Abnormal   Collection Time   07/20/11 10:04 PM      Component Value Range Comment   Glucose-Capillary 123 (*) 70 - 99 (mg/dL)   CBC     Status: Abnormal   Collection Time   07/21/11  6:42 AM      Component Value Range Comment   WBC 4.3  4.0 - 10.5 (K/uL)    RBC 3.01 (*) 4.22 - 5.81 (MIL/uL)    Hemoglobin 8.1 (*) 13.0 - 17.0 (g/dL)    HCT 16.1 (*) 09.6 - 52.0 (%)    MCV 97.0  78.0 - 100.0 (fL)    MCH 26.9  26.0 - 34.0 (pg)    MCHC 27.7 (*) 30.0 - 36.0 (g/dL)    RDW 04.5  40.9 - 81.1 (%)    Platelets 124 (*) 150 - 400 (K/uL)     BASIC METABOLIC PANEL     Status: Abnormal   Collection Time   07/21/11  6:42 AM      Component Value Range Comment   Sodium 146 (*) 135 - 145 (mEq/L)    Potassium 5.3 (*) 3.5 - 5.1 (mEq/L)    Chloride 91 (*) 96 - 112 (mEq/L)    CO2 >45 (*) 19 - 32 (mEq/L)    Glucose, Bld 101 (*) 70 - 99 (mg/dL)    BUN 72 (*) 6 - 23 (mg/dL)    Creatinine, Ser 9.14 (*) 0.50 - 1.35 (mg/dL)    Calcium 9.0  8.4 - 10.5 (mg/dL)    GFR calc non Af Amer 41 (*) >90 (mL/min)    GFR calc Af Amer 47 (*) >90 (mL/min)   GLUCOSE, CAPILLARY     Status: Normal   Collection Time   07/21/11  6:42 AM      Component Value Range Comment   Glucose-Capillary 99  70 - 99 (mg/dL)   GLUCOSE, CAPILLARY     Status: Abnormal   Collection Time   07/21/11 11:02 AM      Component Value Range Comment   Glucose-Capillary 130 (*) 70 - 99 (mg/dL)    Comment 1 Documented in Chart      Comment 2 Notify RN      No results found.  Assessment: Anemia and heme positive stools with no previous colonoscopy. Plan:  Needs colonoscopy and guidance for cardiology as to ideal timing, whether it can be safely done after withdrawal of Plavix etc. Would be available to do it as early as 3 days from now or can pursue as an outpatient. Will followup on December 8 or December 9 Thomos Domine C 07/21/2011, 2:33 PM

## 2011-07-21 NOTE — Progress Notes (Signed)
Physical Therapy Treatment Patient Details Name: [name] [name] MRN: [id number] DOB: 27-May-1951 Today's Date: 07/21/2011  PT Assessment/Plan  PT - Assessment/Plan Comments on Treatment Session: Pt reported being fatigued from already walking during the morning, but agreed to walk again with PT.  Pt's SaO2 95% pre-ambulation, with 2 L02.  During ambulation, SaO2 decreased to 88%.  Pt reported feeling slightly "woozy headed."  Pt returned to sitting in room, SaO2 returned to 93% and pt reported "feeling fine, just tired."   PT Plan: Discharge plan remains appropriate;Frequency remains appropriate PT Frequency: Min 3X/week Follow Up Recommendations: Home health PT Equipment Recommended: Defer to next venue PT Goals  Acute Rehab PT Goals PT Goal: Sit to Stand - Progress: Progressing toward goal PT Goal: Stand to Sit - Progress: Progressing toward goal PT Goal: Ambulate - Progress: Progressing toward goal  PT Treatment Precautions/Restrictions  Precautions Precautions: Fall Required Braces or Orthoses: No Restrictions Weight Bearing Restrictions: No Mobility (including Balance) Bed Mobility Bed Mobility: No Transfers Transfers: Yes Sit to Stand: 6: Modified independent (Device/Increase time) Stand to Sit: 5: Supervision Stand to Sit Details: Verbal cues for hand placement Ambulation/Gait Ambulation/Gait: Yes Ambulation/Gait Assistance: 4: Min assist Ambulation/Gait Assistance Details (indicate cue type and reason): Mingaurd A for pt safety.  Verbal cues for proper RW use. Ambulation Distance (Feet): 70 Feet Assistive device: Rolling walker Gait Pattern: Step-to pattern;Decreased stride length;Antalgic Gait velocity: Decreased Stairs: No  Posture/Postural Control Posture/Postural Control: No significant limitations Exercise    End of Session PT - End of Session Equipment Utilized During Treatment: Gait belt;Other (comment) (RW, O2) Activity Tolerance: Patient limited by  fatigue Patient left: in chair;with call bell in reach;with family/visitor present Nurse Communication: Mobility status for transfers;Mobility status for ambulation General Behavior During Session: [facility] for tasks performed Cognition: [facility] for tasks performed  Lacinda Axon 07/21/2011, 1:38 PM  Jake Shark, PT DPT [phone]

## 2011-07-21 NOTE — Progress Notes (Signed)
Subjective:  Up in chair, no increased SOB  Objective:  Vital Signs in the last 24 hours: Temp:  [97 F (36.1 C)-99.1 F (37.3 C)] 98.6 F (37 C) (12/07 0442) Pulse Rate:  [67-91] 74  (12/07 0442) Resp:  [18-20] 18  (12/07 0442) BP: (92-125)/(51-72) 110/62 mmHg (12/07 0442) SpO2:  [94 %-100 %] 96 % (12/07 0442) Weight:  [123.4 kg (272 lb 0.8 oz)-124 kg (273 lb 5.9 oz)] 272 lb 0.8 oz (123.4 kg) (12/07 0442)  Intake/Output from previous day:  Intake/Output Summary (Last 24 hours) at 07/21/11 0856 Last data filed at 07/21/11 0700  Gross per 24 hour  Intake 1070.5 ml  Output   2425 ml  Net -1354.5 ml    Physical Exam: General appearance: alert, cooperative and no distress Lungs: decreased breath sounds overall Heart: regular rate and rhythm, 2/6 syst murmur LSB   Rate: 75  Rhythm: normal sinus rhythm  Lab Results:  Basename 07/21/11 0642 07/20/11 0645  WBC 4.3 4.4  HGB 8.1* 7.6*  PLT 124* 152    Basename 07/21/11 0642 07/20/11 0645  NA 146* 142  K 5.3* 5.0  CL 91* 87*  CO2 >45* >45*  GLUCOSE 101* 96  BUN 72* 70*  CREATININE 1.76* 1.79*   No results found for this basename: TROPONINI:2,CK,MB:2 in the last 72 hours Hepatic Function Panel No results found for this basename: PROT,ALBUMIN,AST,ALT,ALKPHOS,BILITOT,BILIDIR,IBILI in the last 72 hours No results found for this basename: CHOL in the last 72 hours No results found for this basename: INR in the last 72 hours  Imaging: No results found.  Cardiac Studies:   Principal Problem:  *Diastolic CHF, acute on chronic, I/O continues neg, wgt down, 123.4   Active Problems:  Heme positive anemia, transfused yesterday, hgb 7.6-8.1 after 1 unit RESPIRATORY FAILURE, CHRONIC, on BiPap DIASTOLIC DYSFUNCTION , 2D 4/12 EF 55-60% with severe LVH Chronic renal insufficiency, stage III (moderate)  OBESITY HYPOVENTILATION SYNDROME  CAD-CABG x1 2001, OM PCI 12/05, S-PDA PCI 7/10, OM/CFX DES 8/10, S-RCA DES 7/11 COR  PULMONALE  C O P D  SLEEP APNEA  PVD-Lt SFA PTA 9/09, occluded Rt SFA Diabetes mellitus  HTN (hypertension) Nl renal arteries Low TSH 0.082 11/29    Assessment/Plan:    Plan- Pt evaluated by Dr Elnoria Howard 4/12 and EGD not recommended then. Will discuss with MD. Consider transfusing another unit and following hgb over the weekend.    [name] PA-C 07/21/2011, 8:56 AM

## 2011-07-21 NOTE — Progress Notes (Signed)
UR Completed.   [name] [name] 07/21/2011 [phone]  

## 2011-07-21 NOTE — Progress Notes (Signed)
Pt. Seen and examined. Agree with the NP/PA-C note as written. Inadequate response to transfusion. Heme positive stools, will need GI to re-evaluate today.  Will transfuse if HGB is 7 or less.   Chrystie Nose, MD Attending Cardiologist [facility]

## 2011-07-21 NOTE — Progress Notes (Signed)
CO2 >45. [name] PA notified. Previous order to not call MD with abnormal CO2 due to this being pt's baseline. Will continue to monitor.

## 2011-07-21 NOTE — Progress Notes (Signed)
I spoke with Dr Elnoria Howard who referred me to the un-assigned GI service.   [name] PA-C 07/21/2011 1:03 PM

## 2011-07-22 LAB — CBC
HCT: 32.9 % — ABNORMAL LOW (ref 39.0–52.0)
Hemoglobin: 9.3 g/dL — ABNORMAL LOW (ref 13.0–17.0)
MCH: 26.9 pg (ref 26.0–34.0)
MCHC: 28.3 g/dL — ABNORMAL LOW (ref 30.0–36.0)
MCV: 95.1 fL (ref 78.0–100.0)
Platelets: 122 10*3/uL — ABNORMAL LOW (ref 150–400)
RBC: 3.46 MIL/uL — ABNORMAL LOW (ref 4.22–5.81)
RDW: 14 % (ref 11.5–15.5)
WBC: 5 10*3/uL (ref 4.0–10.5)

## 2011-07-22 LAB — GLUCOSE, CAPILLARY
Glucose-Capillary: 86 mg/dL (ref 70–99)
Glucose-Capillary: 164 mg/dL — ABNORMAL HIGH (ref 70–99)

## 2011-07-22 MED ORDER — METOPROLOL TARTRATE 25 MG PO TABS: 25.0000 mg | ORAL_TABLET | Freq: Two times a day (BID) | ORAL | Status: DC

## 2011-07-22 MED ORDER — METOPROLOL TARTRATE 25 MG PO TABS
25.0000 mg | ORAL_TABLET | Freq: Two times a day (BID) | ORAL | Status: DC
  Administered 2011-07-22: 25 mg via ORAL
  Filled 2011-07-22 (×2): qty 1

## 2011-07-22 MED ORDER — FUROSEMIDE 80 MG PO TABS: 40.0000 mg | ORAL_TABLET | Freq: Two times a day (BID) | ORAL | Status: DC

## 2011-07-22 MED ORDER — ACETAMINOPHEN 325 MG PO TABS: 650.0000 mg | ORAL_TABLET | ORAL | Status: AC | PRN

## 2011-07-22 MED ORDER — ALBUTEROL SULFATE HFA 108 (90 BASE) MCG/ACT IN AERS: 2.0000 | INHALATION_SPRAY | Freq: Four times a day (QID) | RESPIRATORY_TRACT | Status: DC | PRN

## 2011-07-22 MED ORDER — TIOTROPIUM BROMIDE MONOHYDRATE 18 MCG IN CAPS: 18.0000 ug | ORAL_CAPSULE | Freq: Every day | RESPIRATORY_TRACT | Status: DC

## 2011-07-22 NOTE — Progress Notes (Addendum)
Pt. Seen and examined. Agree with the NP/PA-C note as written.  HGB is stable and has come up some. We can stop his plavix for colonoscopy. He will need to be off 5 days, so we can likely discharge him for an outpatient procedure with Dr. Madilyn Fireman - Don't see how he can have colonoscopy on Monday as platelet inhibition is generally for about 5 days with plavix.  He will need a mini-prep given his active heart failure, he is unlikely able to tolerate a full golytely prep. This can be safely done as an outpatient. No active BRBPR or melenic stools noted.  Currently breathing better and no chest pain. Follow-up with Dr. Allyson Sabal.  Chrystie Nose, MD Attending Cardiologist [facility]

## 2011-07-22 NOTE — Progress Notes (Signed)
Subjective:  No increased SOB  Objective:  Vital Signs in the last 24 hours: Temp:  [97.5 F (36.4 C)-98.7 F (37.1 C)] 97.5 F (36.4 C) (12/08 0437) Pulse Rate:  [66-78] 70  (12/08 0437) Resp:  [18] 18  (12/08 0437) BP: (105-145)/(54-76) 145/74 mmHg (12/08 0437) SpO2:  [88 %-100 %] 93 % (12/08 0751) Weight:  [123.1 kg (271 lb 6.2 oz)] 271 lb 6.2 oz (123.1 kg) (12/08 0437)  Intake/Output from previous day:  Intake/Output Summary (Last 24 hours) at 07/22/11 0922 Last data filed at 07/22/11 0841  Gross per 24 hour  Intake   1200 ml  Output   5050 ml  Net  -3850 ml    Physical Exam: General appearance: alert, cooperative and no distress Lungs: decreased breath sounds bilat Heart: regular rate and rhythm, S1, S2 normal, no murmur, click, rub or gallop   Rate: 76  Rhythm: normal sinus rhythm and PVC's runs of NSVT  Lab Results:  Basename 07/22/11 0704 07/21/11 0642  WBC 5.0 4.3  HGB 9.3* 8.1*  PLT 122* 124*    Basename 07/21/11 0642 07/20/11 0645  NA 146* 142  K 5.3* 5.0  CL 91* 87*  CO2 >45* >45*  GLUCOSE 101* 96  BUN 72* 70*  CREATININE 1.76* 1.79*   No results found for this basename: TROPONINI:2,CK,MB:2 in the last 72 hours Hepatic Function Panel No results found for this basename: PROT,ALBUMIN,AST,ALT,ALKPHOS,BILITOT,BILIDIR,IBILI in the last 72 hours No results found for this basename: CHOL in the last 72 hours No results found for this basename: INR in the last 72 hours  Imaging: No results found.  Cardiac Studies:  Assessment/Plan:   Principal Problem:  *Diastolic CHF, acute on chronic Active Problems:  RESPIRATORY FAILURE, CHRONIC, on BiPap  DIASTOLIC DYSFUNCTION  Chronic renal insufficiency, stage III (moderate)  Anemia, past Hx GI bleed 5/12  OBESITY HYPOVENTILATION SYNDROME  CORONARY ARTERY DISEASE, CABG 2001, last PCI 7/11 with Monorail Stent.  COR PULMONALE  C O P D  SLEEP APNEA  PVD (peripheral vascular disease)  Diabetes  mellitus  HTN (hypertension)   Plan- Appreciate Dr Madilyn Fireman consult. Will discuss with MD-colonoscopy prior to D/C , should be OK to come off Plavix. Increase metoprolol to 25mg  BID.   [employer] PA-C 07/22/2011, 9:22 AM

## 2011-07-22 NOTE — Discharge Summary (Signed)
Patient ID: [name] [name],  MRN: [id number], DOB/AGE: 1950-12-13 60 y.o.  Admit date: 07/13/2011 Discharge date: 07/22/2011  Primary Care Provider: Dr Darcella Cheshire Primary Cardiologist: Dr Allyson Sabal  Discharge Diagnoses Principal Problem:  *Diastolic CHF, acute on chronic Active Problems:  RESPIRATORY FAILURE, CHRONIC, on BiPap  DIASTOLIC DYSFUNCTION  Chronic renal insufficiency, stage III (moderate)  Anemia, past Hx GI bleed 5/12  OBESITY HYPOVENTILATION SYNDROME  CORONARY ARTERY DISEASE, CABG 2001, last PCI 7/11  C O P D  SLEEP APNEA  PVD (peripheral vascular disease)  Diabetes mellitus  HTN (hypertension)    Procedures None  History of Present Illness HPI: [name] is a 60 year old male followed by Dr. Jetty Duhamel, Dr. Sander Nephew, Dr. Daneil Dan, and Dr. Viann Shove. He has multiple medical problems. He has a primarily lung disease. He is on home O2 at 3 L. He does have coronary disease, he had bypass grafting in 2001x6. He had an intervention and December 2005, July 2010, August 2010, and most recently July 2011 with Monorail stent. He has normal LV function. An echocardiogram done in April 2012 showed an ejection fraction of 55-60% with severe concentric LVH and grade 1 diastolic dysfunction. He has been admitted for respiratory failure in the past which we felt was a combination of his lung disease as well as some diastolic dysfunction. Recently he's been increasingly weak as an outpatient. His family says he's had increasing somnolence the last 48 hours. His diuretics have recently been adjusted by his nephrologist. Apparently they were stopped for a time and then recently restarted this week.    Hospital Course Pt presented to ER with increasing lethargy. He was found to have CO2 greater than 150. He was admitted and seen in consult by the [facility] service. He improved gradually with diuresis and resp Rx. His CO2 is chronically greater than 45. We felt he was close to  d/c but he then developed heme + anemia and had to be transfused. He was seen in consult by Dr Greggory Brandy. The plan is to stop Plavix and procede with an out patient colonoscopy.  Discharge Vitals:  Blood pressure 145/74, pulse 70, temperature 97.5 F (36.4 C), temperature source Oral, resp. rate 18, height 6\' 1"  (1.854 m), weight 123.1 kg (271 lb 6.2 oz), SpO2 93.00%.    Labs: Results for orders placed during the hospital encounter of 07/13/11 (from the past 48 hour(s))  GLUCOSE, CAPILLARY     Status: Abnormal   Collection Time   07/20/11  4:46 PM      Component Value Range Comment   Glucose-Capillary 147 (*) 70 - 99 (mg/dL)    Comment 1 Notify RN      Comment 2 Documented in Chart     OCCULT BLOOD X 1 CARD TO LAB, STOOL     Status: Normal   Collection Time   07/20/11  7:38 PM      Component Value Range Comment   Fecal Occult Bld POSITIVE     GLUCOSE, CAPILLARY     Status: Abnormal   Collection Time   07/20/11 10:04 PM      Component Value Range Comment   Glucose-Capillary 123 (*) 70 - 99 (mg/dL)   CBC     Status: Abnormal   Collection Time   07/21/11  6:42 AM      Component Value Range Comment   WBC 4.3  4.0 - 10.5 (K/uL)    RBC 3.01 (*) 4.22 - 5.81 (MIL/uL)    Hemoglobin  8.1 (*) 13.0 - 17.0 (g/dL)    HCT 96.0 (*) 45.4 - 52.0 (%)    MCV 97.0  78.0 - 100.0 (fL)    MCH 26.9  26.0 - 34.0 (pg)    MCHC 27.7 (*) 30.0 - 36.0 (g/dL)    RDW 09.8  11.9 - 14.7 (%)    Platelets 124 (*) 150 - 400 (K/uL)   BASIC METABOLIC PANEL     Status: Abnormal   Collection Time   07/21/11  6:42 AM      Component Value Range Comment   Sodium 146 (*) 135 - 145 (mEq/L)    Potassium 5.3 (*) 3.5 - 5.1 (mEq/L)    Chloride 91 (*) 96 - 112 (mEq/L)    CO2 >45 (*) 19 - 32 (mEq/L)    Glucose, Bld 101 (*) 70 - 99 (mg/dL)    BUN 72 (*) 6 - 23 (mg/dL)    Creatinine, Ser 8.29 (*) 0.50 - 1.35 (mg/dL)    Calcium 9.0  8.4 - 10.5 (mg/dL)    GFR calc non Af Amer 41 (*) >90 (mL/min)    GFR calc Af Amer 47 (*) >90  (mL/min)   GLUCOSE, CAPILLARY     Status: Normal   Collection Time   07/21/11  6:42 AM      Component Value Range Comment   Glucose-Capillary 99  70 - 99 (mg/dL)   T3, FREE     Status: Normal   Collection Time   07/21/11 10:41 AM      Component Value Range Comment   T3, Free 2.3  2.3 - 4.2 (pg/mL)   GLUCOSE, CAPILLARY     Status: Abnormal   Collection Time   07/21/11 11:02 AM      Component Value Range Comment   Glucose-Capillary 130 (*) 70 - 99 (mg/dL)    Comment 1 Documented in Chart      Comment 2 Notify RN     GLUCOSE, CAPILLARY     Status: Abnormal   Collection Time   07/21/11  4:08 PM      Component Value Range Comment   Glucose-Capillary 168 (*) 70 - 99 (mg/dL)   GLUCOSE, CAPILLARY     Status: Abnormal   Collection Time   07/21/11 10:19 PM      Component Value Range Comment   Glucose-Capillary 102 (*) 70 - 99 (mg/dL)   GLUCOSE, CAPILLARY     Status: Normal   Collection Time   07/22/11  6:23 AM      Component Value Range Comment   Glucose-Capillary 86  70 - 99 (mg/dL)    Comment 1 Notify RN     CBC     Status: Abnormal   Collection Time   07/22/11  7:04 AM      Component Value Range Comment   WBC 5.0  4.0 - 10.5 (K/uL)    RBC 3.46 (*) 4.22 - 5.81 (MIL/uL)    Hemoglobin 9.3 (*) 13.0 - 17.0 (g/dL)    HCT 56.2 (*) 13.0 - 52.0 (%)    MCV 95.1  78.0 - 100.0 (fL)    MCH 26.9  26.0 - 34.0 (pg)    MCHC 28.3 (*) 30.0 - 36.0 (g/dL)    RDW 86.5  78.4 - 69.6 (%)    Platelets 122 (*) 150 - 400 (K/uL)   GLUCOSE, CAPILLARY     Status: Abnormal   Collection Time   07/22/11 10:37 AM      Component Value Range Comment  Glucose-Capillary 164 (*) 70 - 99 (mg/dL)    Comment 1 Documented in Chart      Comment 2 Notify RN     GLUCOSE, CAPILLARY     Status: Abnormal   Collection Time   07/22/11 11:17 AM      Component Value Range Comment   Glucose-Capillary 145 (*) 70 - 99 (mg/dL)    Comment 1 Documented in Chart      Comment 2 Notify RN       Disposition:  Follow-up Information     Follow up with MARTIN,TANYA D. (call office)       Follow up with HAYES,JOHN C, MD. (call office Monday)    Contact information:   [address], [address] [employer], [place]. [place] [address] [phone]       Follow up with Runell Gess, MD. (office will call)    Contact information:   [address] [place] [address] [phone]          Discharge Medications:  Current Discharge Medication List    START taking these medications   Details  acetaminophen (TYLENOL) 325 MG tablet Take 2 tablets (650 mg total) by mouth every 4 (four) hours as needed. Qty: 30 tablet    tiotropium (SPIRIVA) 18 MCG inhalation capsule Place 1 capsule (18 mcg total) into inhaler and inhale daily. Qty: 30 capsule, Refills: 5      CONTINUE these medications which have CHANGED   Details  furosemide (LASIX) 80 MG tablet Take 0.5 tablets (40 mg total) by mouth 2 (two) times daily.    metoprolol tartrate (LOPRESSOR) 25 MG tablet Take 1 tablet (25 mg total) by mouth 2 (two) times daily.      CONTINUE these medications which have NOT CHANGED   Details  aspirin 81 MG tablet Take 81 mg by mouth daily.      COLCRYS 0.6 MG tablet Take 1 tablet by mouth daily.    diazepam (VALIUM) 5 MG tablet Take 5 mg by mouth at bedtime as needed.      Fluticasone-Salmeterol (ADVAIR) 250-50 MCG/DOSE AEPB Inhale 1 puff into the lungs every 12 (twelve) hours.      HYDROcodone-acetaminophen (VICODIN) 5-500 MG per tablet Take 1 tablet by mouth Every 4 hours as needed.    isosorbide mononitrate (IMDUR) 30 MG 24 hr tablet Take 45 mg by mouth daily.      Multiple Vitamin (MULTIVITAMIN) capsule Take 1 capsule by mouth daily.      pantoprazole (PROTONIX) 40 MG tablet Take 1 tablet by mouth daily.    polysaccharide iron (NIFEREX) 150 MG CAPS capsule Take 150 mg by mouth 2 (two) times daily.      potassium chloride SA (K-DUR,KLOR-CON) 20 MEQ  tablet Take 20 mEq by mouth 2 (two) times daily.      rosuvastatin (CRESTOR) 20 MG tablet Take 20 mg by mouth daily.      Tamsulosin HCl (FLOMAX) 0.4 MG CAPS Take 1 tablet by mouth daily.    metFORMIN (GLUCOPHAGE-XR) 750 MG 24 hr tablet Take 750 mg by mouth daily with breakfast.      nitroGLYCERIN (NITROSTAT) 0.4 MG SL tablet Place 0.4 mg under the tongue every 5 (five) minutes as needed.        STOP taking these medications     albuterol (PROAIR HFA) 108 (90 BASE) MCG/ACT inhaler      clopidogrel (PLAVIX) 75 MG tablet  Outstanding Labs/Studies  Duration of Discharge Encounter: Greater than 30 minutes including physician time.  Jolene Provost PA-C 07/22/2011 2:08 PM

## 2011-07-22 NOTE — Progress Notes (Signed)
CARE MANAGEMENT NOTE 07/22/2011  Patient:  [name] [name], [name] [name]   Account Number:  [id number]  Date Initiated:  07/19/2011  Documentation initiated by:  Shannan Harper  Subjective/Objective Assessment:   Patient admitted due to increased weakness and somulence, found to be in CHF on admit, has CHF history.  Heme positive stool with H/H drop with blood products, GI consult.     Action/Plan:   Discharge to home with [facility] and CHF disease mgmt.   Anticipated DC Date:  07/20/2011   Anticipated DC Plan:  HOME W HOME HEALTH SERVICES      DC Planning Services  CM consult      PAC Choice  DURABLE MEDICAL EQUIPMENT  HOME HEALTH   Choice offered to / List presented to:  C-1 Patient   DME arranged  BIPAP  OXYGEN      DME agency  [facility]     [facility] arranged  HH-1 RN  HH-10 DISEASE MANAGEMENT      HH agency  [facility]   Status of service:  Completed, signed off Medicare Important Message given?   (If response is "NO", the following Medicare IM given date fields will be blank) Date Medicare IM given:   Date Additional Medicare IM given:    Discharge Disposition:  HOME W HOME HEALTH SERVICES  Per UR Regulation:  Reviewed for med. necessity/level of care/duration of stay  Comments:  07/22/2011 1630 Contacted [facility] for [facility] RN and PT for scheduled d/c today. Pt states he will call his wife to bring portable tank and he had BIPAP at home.  Isidoro Donning RN CCM Case Mgmt phone [phone]  Heart Failure Patient:  UR Concurrent Review Completed. 07/21/11 1455 Shannan Harper, RN, BSN  Received referral to set up home oxygen and bipap.  Patient already has bipap through [facility].  He agreed with oxygen through [facility] as well as HHRN for CHF disease management. Referral placed with Hilda Lias, RN with [facility].  Patient did not have heart failure packet at bedside.  Obtained and we discussed at length zones as that was new for him.  Will continue to assist.  07/19/11 1236 Shannan Harper,  RN, BSN

## 2011-07-22 NOTE — Discharge Summary (Signed)
Italo Banton C. Laporsha Grealish, MD Attending Cardiologist [facility]  

## 2011-07-22 NOTE — Plan of Care (Signed)
To cardiology: Please advise whether elective colonoscopy desired for December 10 or where the third would be acceptable in the absence of overt recurrent GI bleeding to defer until cardiac situation more optimized and ideally that patient can have his Plavix held. Will followup tomorrow.

## 2011-07-22 NOTE — Progress Notes (Signed)
Placed pt. On CPAP of 8cmH2O with 2L O2 bled in via [facility]. Pt. Is tolerating CPAP well at this time.

## 2011-07-25 ENCOUNTER — Telehealth: Payer: Self-pay | Admitting: Internal Medicine

## 2011-07-25 DIAGNOSIS — G4730 Sleep apnea, unspecified: Secondary | ICD-10-CM

## 2011-07-25 NOTE — Telephone Encounter (Signed)
Pt states BiPAP is no longer working and needs a replacement. Was last seen 06/28/11 and advised to follow up in 4 months. Pt was d/c from hospital on Saturday. Wants to know if [facility] will send an order for replacement machine without having to come in on 07/27/11. Please advise, thanks. Pt is aware will receive call tomorrow.

## 2011-07-26 NOTE — Telephone Encounter (Signed)
Error.  Duplicate message.  Holly D Pryor °- °

## 2011-07-26 NOTE — Telephone Encounter (Signed)
Pt aware of order for new BiPap and to keep OV for Feb 2013. We have cancelled the appt for 12/13 per Katie.

## 2011-07-26 NOTE — Telephone Encounter (Signed)
Per CY-ok to replace BiPAP.

## 2011-07-27 ENCOUNTER — Ambulatory Visit: Payer: Medicare Other | Admitting: Internal Medicine

## 2011-07-27 ENCOUNTER — Telehealth: Payer: Self-pay | Admitting: Internal Medicine

## 2011-07-27 DIAGNOSIS — G4733 Obstructive sleep apnea (adult) (pediatric): Secondary | ICD-10-CM

## 2011-07-27 NOTE — Telephone Encounter (Signed)
[facility] reports his old BIPAP machine has been worn out, only delivering 10 cwp. They gave loaner, but need script to replace.

## 2011-08-11 ENCOUNTER — Encounter (HOSPITAL_COMMUNITY)
Admission: RE | Disposition: A | Discharge status: Home / Self Care | Payer: Self-pay | Source: Ambulatory Visit | Attending: Gastroenterology

## 2011-08-11 ENCOUNTER — Ambulatory Visit (HOSPITAL_COMMUNITY)
Admission: RE | Admit: 2011-08-11 | Discharge: 2011-08-11 | Disposition: A | Discharge status: Home / Self Care | Payer: Medicare Other | Source: Ambulatory Visit | Attending: Gastroenterology | Admitting: Gastroenterology

## 2011-08-11 ENCOUNTER — Other Ambulatory Visit: Payer: Self-pay | Admitting: Gastroenterology

## 2011-08-11 ENCOUNTER — Encounter (HOSPITAL_COMMUNITY): Payer: Self-pay | Admitting: *Deleted

## 2011-08-11 DIAGNOSIS — D649 Anemia, unspecified: Secondary | ICD-10-CM | POA: Insufficient documentation

## 2011-08-11 DIAGNOSIS — R195 Other fecal abnormalities: Secondary | ICD-10-CM | POA: Insufficient documentation

## 2011-08-11 DIAGNOSIS — D126 Benign neoplasm of colon, unspecified: Secondary | ICD-10-CM | POA: Insufficient documentation

## 2011-08-11 HISTORY — PX: COLONOSCOPY: SHX5424

## 2011-08-11 HISTORY — DX: Gastro-esophageal reflux disease without esophagitis: K21.9

## 2011-08-11 HISTORY — PX: ESOPHAGOGASTRODUODENOSCOPY: SHX5428

## 2011-08-11 LAB — GLUCOSE, CAPILLARY: Glucose-Capillary: 111 mg/dL — ABNORMAL HIGH (ref 70–99)

## 2011-08-11 SURGERY — EGD (ESOPHAGOGASTRODUODENOSCOPY)
Anesthesia: Moderate Sedation

## 2011-08-11 MED ORDER — FENTANYL NICU IV SYRINGE 50 MCG/ML
INJECTION | INTRAMUSCULAR | Status: DC | PRN
  Administered 2011-08-11 (×3): 25 ug via INTRAVENOUS

## 2011-08-11 MED ORDER — SODIUM CHLORIDE 0.9 % IV SOLN
INTRAVENOUS | Status: DC
  Administered 2011-08-11: 500 mL via INTRAVENOUS

## 2011-08-11 MED ORDER — DIPHENHYDRAMINE HCL 50 MG/ML IJ SOLN
INTRAMUSCULAR | Status: AC
  Filled 2011-08-11: qty 1

## 2011-08-11 MED ORDER — MIDAZOLAM HCL 5 MG/5ML IJ SOLN: INTRAMUSCULAR | Status: DC | PRN

## 2011-08-11 MED ORDER — MIDAZOLAM HCL 10 MG/2ML IJ SOLN
INTRAMUSCULAR | Status: DC | PRN
  Administered 2011-08-11 (×3): 2 mg via INTRAVENOUS

## 2011-08-11 MED ORDER — MIDAZOLAM HCL 10 MG/2ML IJ SOLN
INTRAMUSCULAR | Status: AC
  Filled 2011-08-11: qty 4

## 2011-08-11 MED ORDER — FENTANYL CITRATE 0.05 MG/ML IJ SOLN
INTRAMUSCULAR | Status: AC
  Filled 2011-08-11: qty 4

## 2011-08-11 MED ORDER — BUTAMBEN-TETRACAINE-BENZOCAINE 2-2-14 % EX AERO
INHALATION_SPRAY | CUTANEOUS | Status: DC | PRN
  Administered 2011-08-11: 2 via TOPICAL

## 2011-08-11 NOTE — Brief Op Note (Signed)
08/11/2011  10:06 AM  PATIENT:  [name] [name]  60 y.o. male  PRE-OPERATIVE DIAGNOSIS:  Anemia, Iron Deficiency Positive Hemocult stools  POST-OPERATIVE DIAGNOSIS:  Duodenal Polyp, Sigmoid Polyp  PROCEDURE:  Procedure(s): ESOPHAGOGASTRODUODENOSCOPY (EGD) COLONOSCOPY  SURGEON:  Surgeon(s): Barrie Folk, MD

## 2011-08-11 NOTE — Op Note (Signed)
[facility] [address] [place], [place]  [address]  COLONOSCOPY PROCEDURE REPORT  PATIENT:  [name] [name], [name] [name]  MR#:  [id number] BIRTHDATE:  1951-01-29, 60 yrs. old  GENDER:  male ENDOSCOPIST:  Dorena Cookey REF. BY: PROCEDURE DATE:  08/11/2011 PROCEDURE:  Colonoscopy with polypectomy ASA CLASS: INDICATIONS:   anemia and heme positive stools MEDICATIONS:  None  DESCRIPTION OF PROCEDURE:   After the risks benefits and alternatives of the procedure were thoroughly explained, informed consent was obtained.  The EC-[id number] ([id number]) endoscope was introduced through the anus and advanced to the , without limitations.  The quality of the prep was .  The instrument was then slowly withdrawn as the colon was fully examined. <<PROCEDUREIMAGES>>  FINDINGS: 1.4 cm pedunculated sigmoid polyp removed by snare, otherwise normal study  COMPLICATIONS:  None  IMPRESSION: Sigmoid colon polyp  RECOMMENDATIONS: Await histology. Okay to resume Plavix in 2 days.  ______________________________ Dorena Cookey  CC:  n. eSIGNED:   Dorena Cookey at 08/11/2011 09:31 AM  Hillery Jacks, [phone]

## 2011-08-11 NOTE — Brief Op Note (Signed)
08/11/2011  9:42 AM  PATIENT:  [name] [name]  60 y.o. male  PRE-OPERATIVE DIAGNOSIS:  Anemia, Iron Deficiency Positive Hemocult stools  POST-OPERATIVE DIAGNOSIS:  Duodenal Polyp, Sigmoid Polyp  PROCEDURE:  Procedure(s): ESOPHAGOGASTRODUODENOSCOPY (EGD) COLONOSCOPY  SURGEON:  Surgeon(s): Barrie Folk, MD

## 2011-08-11 NOTE — Op Note (Signed)
[facility] [address] [place], [place]  [address]  ENDOSCOPY PROCEDURE REPORT  PATIENT:  [name] [name], [name] [name]  MR#:  [id number] BIRTHDATE:  12/01/1950, 60 yrs. old  GENDER:  male  ENDOSCOPIST:  Dorena Cookey Referred by:  PROCEDURE DATE:  08/11/2011 PROCEDURE:  Esophagogastroduodenoscopy with biopsy ASA CLASS: INDICATIONS:   anemia and heme positive stool  MEDICATIONS:  Fentanyl 50 mcg, Versed 4 mg TOPICAL ANESTHETIC: Cetacaine spray  DESCRIPTION OF PROCEDURE:   After the risks benefits and alternatives of the procedure were thoroughly explained, informed consent was obtained.  The Pentax Gastroscope [id number] endoscope was introduced through the mouth and advanced to the , without limitations.  The instrument was slowly withdrawn as the mucosa was fully examined. <<PROCEDUREIMAGES>>  FINDINGS:  1 cm subcutaneous duodenal bulb nodule, biopsied. Otherwise unremarkable.  COMPLICATIONS:  None  ENDOSCOPIC IMPRESSION:  Submucosal duodenal bulb nodule, biopsied.  RECOMMENDATIONS: Await pathology.  REPEAT EXAM:  ______________________________ Dorena Cookey  CC:  n. eSIGNED:   Dorena Cookey at 08/11/2011 08:59 AM  Hillery Jacks, [phone]

## 2011-08-11 NOTE — Brief Op Note (Signed)
08/11/2011  9:31 AM  PATIENT:  [name] [name]  60 y.o. male  PRE-OPERATIVE DIAGNOSIS:  Anemia, Iron Deficiency Positive Hemocult stools  POST-OPERATIVE DIAGNOSIS:  Duodenal Polyp, Sigmoid Polyp  PROCEDURE:  Procedure(s): ESOPHAGOGASTRODUODENOSCOPY (EGD) COLONOSCOPY  SURGEON:  Surgeon(s): Barrie Folk, MD

## 2011-08-14 ENCOUNTER — Encounter (HOSPITAL_COMMUNITY): Payer: Self-pay | Admitting: Gastroenterology

## 2011-08-22 ENCOUNTER — Encounter (HOSPITAL_COMMUNITY)
Admission: RE | Admit: 2011-08-22 | Discharge: 2011-08-22 | Disposition: A | Discharge status: Home / Self Care | Payer: Medicare Other | Source: Ambulatory Visit | Attending: Nephrology | Admitting: Nephrology

## 2011-08-22 DIAGNOSIS — D638 Anemia in other chronic diseases classified elsewhere: Secondary | ICD-10-CM | POA: Insufficient documentation

## 2011-08-22 DIAGNOSIS — N183 Chronic kidney disease, stage 3 unspecified: Secondary | ICD-10-CM | POA: Insufficient documentation

## 2011-08-22 LAB — IRON AND TIBC
Iron: 51 ug/dL (ref 42–135)
Saturation Ratios: 18 % — ABNORMAL LOW (ref 20–55)
UIBC: 233 ug/dL (ref 125–400)

## 2011-08-22 LAB — FERRITIN: Ferritin: 396 ng/mL — ABNORMAL HIGH (ref 22–322)

## 2011-08-22 MED ORDER — DARBEPOETIN ALFA-POLYSORBATE 500 MCG/ML IJ SOLN
40.0000 ug | INTRAMUSCULAR | Status: DC
  Filled 2011-08-22: qty 1

## 2011-08-22 MED ORDER — DARBEPOETIN ALFA-POLYSORBATE 40 MCG/0.4ML IJ SOLN
INTRAMUSCULAR | Status: AC
  Administered 2011-08-22: 40 ug via SUBCUTANEOUS
  Filled 2011-08-22: qty 0.4

## 2011-09-01 ENCOUNTER — Other Ambulatory Visit (HOSPITAL_COMMUNITY): Payer: Self-pay | Admitting: *Deleted

## 2011-09-05 ENCOUNTER — Encounter (HOSPITAL_COMMUNITY)
Admission: RE | Admit: 2011-09-05 | Discharge: 2011-09-05 | Disposition: A | Discharge status: Home / Self Care | Payer: Medicare Other | Source: Ambulatory Visit | Attending: Nephrology | Admitting: Nephrology

## 2011-09-05 ENCOUNTER — Encounter (HOSPITAL_COMMUNITY): Payer: Medicare Other

## 2011-09-05 MED ORDER — DARBEPOETIN ALFA-POLYSORBATE 500 MCG/ML IJ SOLN
40.0000 ug | INTRAMUSCULAR | Status: DC
  Filled 2011-09-05: qty 1

## 2011-09-05 MED ORDER — FERUMOXYTOL INJECTION 510 MG/17 ML
510.0000 mg | INTRAVENOUS | Status: DC
  Administered 2011-09-05: 510 mg via INTRAVENOUS
  Filled 2011-09-05: qty 17

## 2011-09-05 MED ORDER — DARBEPOETIN ALFA-POLYSORBATE 40 MCG/0.4ML IJ SOLN
INTRAMUSCULAR | Status: AC
  Administered 2011-09-05: 40 ug via SUBCUTANEOUS
  Filled 2011-09-05: qty 0.4

## 2011-09-08 ENCOUNTER — Encounter (HOSPITAL_COMMUNITY)
Admission: RE | Admit: 2011-09-08 | Discharge: 2011-09-08 | Disposition: A | Discharge status: Home / Self Care | Payer: Medicare Other | Source: Ambulatory Visit | Attending: Nephrology | Admitting: Nephrology

## 2011-09-08 MED ORDER — FERUMOXYTOL INJECTION 510 MG/17 ML
510.0000 mg | INTRAVENOUS | Status: AC
Start: 2011-09-12 — End: 2011-09-08
  Administered 2011-09-08: 510 mg via INTRAVENOUS

## 2011-09-08 MED ORDER — FERUMOXYTOL INJECTION 510 MG/17 ML
INTRAVENOUS | Status: AC
  Filled 2011-09-08: qty 17

## 2011-09-18 ENCOUNTER — Encounter (HOSPITAL_COMMUNITY): Payer: Medicare Other

## 2011-09-19 ENCOUNTER — Encounter (HOSPITAL_COMMUNITY)
Admission: RE | Admit: 2011-09-19 | Discharge: 2011-09-19 | Disposition: A | Discharge status: Home / Self Care | Payer: Medicare Other | Source: Ambulatory Visit | Attending: Nephrology | Admitting: Nephrology

## 2011-09-19 DIAGNOSIS — D638 Anemia in other chronic diseases classified elsewhere: Secondary | ICD-10-CM | POA: Insufficient documentation

## 2011-09-19 DIAGNOSIS — N183 Chronic kidney disease, stage 3 unspecified: Secondary | ICD-10-CM | POA: Insufficient documentation

## 2011-09-19 LAB — IRON AND TIBC
Saturation Ratios: 26 % (ref 20–55)
TIBC: 262 ug/dL (ref 215–435)
UIBC: 195 ug/dL (ref 125–400)

## 2011-09-19 LAB — FERRITIN: Ferritin: 562 ng/mL — ABNORMAL HIGH (ref 22–322)

## 2011-09-19 LAB — POCT HEMOGLOBIN-HEMACUE: Hemoglobin: 9.4 g/dL — ABNORMAL LOW (ref 13.0–17.0)

## 2011-09-19 MED ORDER — DARBEPOETIN ALFA-POLYSORBATE 40 MCG/0.4ML IJ SOLN
INTRAMUSCULAR | Status: AC
  Administered 2011-09-19: 40 ug via SUBCUTANEOUS
  Filled 2011-09-19: qty 0.4

## 2011-09-19 MED ORDER — DARBEPOETIN ALFA-POLYSORBATE 500 MCG/ML IJ SOLN
40.0000 ug | INTRAMUSCULAR | Status: DC
  Filled 2011-09-19: qty 1

## 2011-09-25 ENCOUNTER — Encounter: Payer: Self-pay | Admitting: Internal Medicine

## 2011-09-25 ENCOUNTER — Ambulatory Visit (INDEPENDENT_AMBULATORY_CARE_PROVIDER_SITE_OTHER): Payer: Medicare Other | Admitting: Internal Medicine

## 2011-09-25 VITALS — BP 126/70 | HR 85 | Ht 73.0 in | Wt 281.6 lb

## 2011-09-25 DIAGNOSIS — J9610 Chronic respiratory failure, unspecified whether with hypoxia or hypercapnia: Secondary | ICD-10-CM

## 2011-09-25 DIAGNOSIS — G4730 Sleep apnea, unspecified: Secondary | ICD-10-CM

## 2011-09-25 DIAGNOSIS — I519 Heart disease, unspecified: Secondary | ICD-10-CM

## 2011-09-25 NOTE — Progress Notes (Addendum)
Patient ID: [name] [name], male    DOB: [birth year], 61 y.o.   MRN: [id number]  HPI 01/23/11- COPD, CAD/diastolic dysfunction, OSA, chronic respiratory failure, obesity Hypoventilation, cor pulmonale Hosp since last here- once for "shakes"-they told him wasn't using CPAP right, once for epistaxis, once to place urinary stent, and has had a right ? renal biopsy. Told anemic.  Breathing stable, but can't lie flat- feels smothered even on O2.  Runs O2 2-3L/M usually. I gave permision to go to 4 during exertion. Notes "tiresome" feeling midchest, related to exertion/ climbing stairs and relieved by rest. Told to pace himself and not try to push through that.  Discussed need for O2. He doesn't sleep well with his BiPAP 15/12. Discussed sleep hygiene- discussed room temperature. He was more comfortable with autotitration in hosp and we discussed a trial of that at home.  Has restarted diuretic since leg edema has come back.   05/25/11-  COPD, CAD/diastolic dysfunction, OSA, chronic respiratory failure, obesity Hypoventilation, cor pulmonale Went to ER last week- short of breath. Air wasn't satisfying. Was started back on shot for anemia. Feels some better.  Reports sneezing, postnasal drainage and a sense of mucus in his upper chest. Denies fever, sore throat and doesn't think he has a cold.  06/28/11-  COPD, CAD/diastolic dysfunction, OSA, chronic respiratory failure, obesity Hypoventilation, cor pulmonale Recently hospitalized October 25-29, notes reviewed with him and x-ray images reviewed by me. He was hospitalized for hypercapnic respiratory failure with transient encephalopathy and renal insufficiency. Since discharge he has regained some ankle edema while off of furosemide. He has a nephrology appointment later this month. He is concerned that he was taken off his diabetes medicines and is directed to discuss this with his primary physician immediately. He has some persistent soreness across his  anterior chest wall at the level of the xiphoid but otherwise breathing better with less exertional dyspnea and before he went in the hospital. He remains dependent on continuous oxygen at 2 L. He continues his BiPAP machine all night, every night but says he was sleeping better with it  on autotitration with a lower pressure used in the hospital.  09/25/11- COPD, CAD/diastolic dysfunction, OSA, chronic respiratory failure, obesity Hypoventilation, cor pulmonale Since last here he was hospitalized for respiratory failure with CHF and obesity hypoventilation. Better with diuresis. BiPAP 13/10 is now comfortable and used all night every night with supplemental oxygen 4 L. Tolerated colonoscopy without respiratory distress. Uses a rescue inhaler only occasionally but says it does help then.  Review of Systems Constitutional:   +  weight loss,  No-night sweats, fevers, chills, fatigue, lassitude. HEENT:   No-  headaches, difficulty swallowing, tooth/dental problems, sore throat,       +  sneezing, itching, ear ache, nasal congestion, post nasal drip,  CV:  No-   chest pain, orthopnea, PND,  Much less-swelling in lower extremities, anasarca, dizziness, palpitations Resp: + shortness of breath with exertion or at rest.              No-   productive cough,  No non-productive cough,  No-  coughing up of blood.              No-   change in color of mucus.  No- wheezing.   Skin: No-   rash or lesions. GI:  No-   heartburn, indigestion, abdominal pain, nausea, vomiting, diarrhea,  change in bowel habits, loss of appetite GU: MS:  No-   joint pain or swelling.  No- decreased range of motion.  No- back pain. Neuro- grossly normal to observation, Or:  Psych:  No- change in mood or affect. No depression or anxiety.  No memory loss.       Objective:   Physical Exam   General- Alert, Oriented, Affect-appropriate, Distress- none acute;  Obese,   Portable pulse O2 2.5L, sat 93% Skin-  rash-none, lesions- none, excoriation- none Lymphadenopathy- none Head- atraumatic            Eyes- Gross vision intact, PERRLA, conjunctivae clear secretions            Ears- Hearing, canals- normal            Nose- Clear, No-Septal dev mucus, polyps, erosion, perforation             Throat- Mallampati II , mucosa clear , drainage- none, tonsils- atrophic Neck- flexible , trachea midline, no stridor , thyroid nl, carotid no bruit Chest - symmetrical excursion , unlabored           Heart/CV- RRR , 1/6 systolic murmur , no gallop  , no rub, nl s1 s2                           - JVD- none , edema- none on today's exam, stasis changes- none,  varices- none           Lung- clear to P&A, wheeze- none, cough- none , dullness-none, rub- none. No rales heard.           Chest wall-  Abd- Br/ Gen/ Rectal- Not done, not indicated Extrem- cyanosis- none, clubbing, none, atrophy- none, strength- nl Neuro- grossly intact to observation

## 2011-09-25 NOTE — Patient Instructions (Addendum)
We will contact [facility] to get your current BiPAP setting= 13/10  Please call as needed

## 2011-09-27 NOTE — Assessment & Plan Note (Signed)
Breathing much more easily with better control of heart failure.

## 2011-09-27 NOTE — Assessment & Plan Note (Signed)
Good compliance and control now on BiPAP with oxygen.

## 2011-09-27 NOTE — Assessment & Plan Note (Signed)
Remained variable is his heart failure with fluid retention which is now under better control. He is breathing and oxygenating more easily. He remains dependent on BiPAP with oxygen for sleep.

## 2011-10-03 ENCOUNTER — Encounter (HOSPITAL_COMMUNITY)
Admission: RE | Admit: 2011-10-03 | Discharge: 2011-10-03 | Disposition: A | Discharge status: Home / Self Care | Payer: Medicare Other | Source: Ambulatory Visit | Attending: Nephrology | Admitting: Nephrology

## 2011-10-03 LAB — POCT HEMOGLOBIN-HEMACUE: Hemoglobin: 9.6 g/dL — ABNORMAL LOW (ref 13.0–17.0)

## 2011-10-03 MED ORDER — DARBEPOETIN ALFA-POLYSORBATE 500 MCG/ML IJ SOLN
40.0000 ug | INTRAMUSCULAR | Status: DC
  Filled 2011-10-03: qty 1

## 2011-10-03 MED ORDER — DARBEPOETIN ALFA-POLYSORBATE 40 MCG/0.4ML IJ SOLN
INTRAMUSCULAR | Status: AC
  Administered 2011-10-03: 40 ug via SUBCUTANEOUS
  Filled 2011-10-03: qty 0.4

## 2011-10-19 ENCOUNTER — Encounter (HOSPITAL_COMMUNITY)
Admission: RE | Admit: 2011-10-19 | Discharge: 2011-10-19 | Disposition: A | Discharge status: Home / Self Care | Payer: Medicare Other | Source: Ambulatory Visit | Attending: Nephrology | Admitting: Nephrology

## 2011-10-19 DIAGNOSIS — N183 Chronic kidney disease, stage 3 unspecified: Secondary | ICD-10-CM | POA: Insufficient documentation

## 2011-10-19 DIAGNOSIS — D638 Anemia in other chronic diseases classified elsewhere: Secondary | ICD-10-CM | POA: Insufficient documentation

## 2011-10-19 LAB — POCT HEMOGLOBIN-HEMACUE: Hemoglobin: 10.3 g/dL — ABNORMAL LOW (ref 13.0–17.0)

## 2011-10-19 LAB — IRON AND TIBC: Saturation Ratios: 19 % — ABNORMAL LOW (ref 20–55)

## 2011-10-19 MED ORDER — DARBEPOETIN ALFA-POLYSORBATE 500 MCG/ML IJ SOLN
40.0000 ug | INTRAMUSCULAR | Status: DC
  Filled 2011-10-19: qty 1

## 2011-10-19 MED ORDER — DARBEPOETIN ALFA-POLYSORBATE 40 MCG/0.4ML IJ SOLN
INTRAMUSCULAR | Status: AC
  Administered 2011-10-19: 40 ug via SUBCUTANEOUS
  Filled 2011-10-19: qty 0.4

## 2011-10-25 ENCOUNTER — Other Ambulatory Visit (HOSPITAL_COMMUNITY): Payer: Self-pay | Admitting: *Deleted

## 2011-10-26 ENCOUNTER — Ambulatory Visit: Payer: Medicare Other | Admitting: Internal Medicine

## 2011-10-27 ENCOUNTER — Encounter (HOSPITAL_COMMUNITY)
Admission: RE | Admit: 2011-10-27 | Discharge: 2011-10-27 | Disposition: A | Discharge status: Home / Self Care | Payer: Medicare Other | Source: Ambulatory Visit | Attending: Nephrology | Admitting: Nephrology

## 2011-10-27 MED ORDER — FERUMOXYTOL INJECTION 510 MG/17 ML
INTRAVENOUS | Status: AC
  Filled 2011-10-27: qty 17

## 2011-10-27 MED ORDER — SODIUM CHLORIDE 0.9 % IV SOLN
INTRAVENOUS | Status: DC
  Administered 2011-10-27: 12:00:00 via INTRAVENOUS

## 2011-10-27 MED ORDER — FERUMOXYTOL INJECTION 510 MG/17 ML
510.0000 mg | INTRAVENOUS | Status: DC
  Administered 2011-10-27: 510 mg via INTRAVENOUS
  Filled 2011-10-27: qty 17

## 2011-10-28 ENCOUNTER — Other Ambulatory Visit: Payer: Self-pay | Admitting: Internal Medicine

## 2011-11-02 ENCOUNTER — Encounter (HOSPITAL_COMMUNITY): Payer: Medicare Other | Attending: Nephrology

## 2011-11-02 DIAGNOSIS — D638 Anemia in other chronic diseases classified elsewhere: Secondary | ICD-10-CM | POA: Insufficient documentation

## 2011-11-02 DIAGNOSIS — N183 Chronic kidney disease, stage 3 unspecified: Secondary | ICD-10-CM | POA: Insufficient documentation

## 2011-11-08 ENCOUNTER — Encounter (HOSPITAL_COMMUNITY)
Admission: RE | Admit: 2011-11-08 | Discharge: 2011-11-08 | Disposition: A | Discharge status: Home / Self Care | Payer: Medicare Other | Source: Ambulatory Visit | Attending: Nephrology | Admitting: Nephrology

## 2011-11-08 MED ORDER — DARBEPOETIN ALFA-POLYSORBATE 40 MCG/0.4ML IJ SOLN
INTRAMUSCULAR | Status: AC
  Administered 2011-11-08: 40 ug via SUBCUTANEOUS
  Filled 2011-11-08: qty 0.4

## 2011-11-08 MED ORDER — SODIUM CHLORIDE 0.9 % IV SOLN: INTRAVENOUS | Status: DC

## 2011-11-08 MED ORDER — FERUMOXYTOL INJECTION 510 MG/17 ML
510.0000 mg | INTRAVENOUS | Status: AC
  Administered 2011-11-08: 510 mg via INTRAVENOUS
  Filled 2011-11-08: qty 17

## 2011-11-08 MED ORDER — DARBEPOETIN ALFA-POLYSORBATE 500 MCG/ML IJ SOLN
40.0000 ug | INTRAMUSCULAR | Status: DC
  Filled 2011-11-08: qty 1

## 2011-11-14 ENCOUNTER — Other Ambulatory Visit (HOSPITAL_COMMUNITY): Payer: Self-pay | Admitting: *Deleted

## 2011-11-16 ENCOUNTER — Encounter (HOSPITAL_COMMUNITY)
Admission: RE | Admit: 2011-11-16 | Discharge: 2011-11-16 | Disposition: A | Discharge status: Home / Self Care | Payer: Medicare Other | Source: Ambulatory Visit | Attending: Nephrology | Admitting: Nephrology

## 2011-11-16 DIAGNOSIS — N183 Chronic kidney disease, stage 3 unspecified: Secondary | ICD-10-CM | POA: Insufficient documentation

## 2011-11-16 DIAGNOSIS — D638 Anemia in other chronic diseases classified elsewhere: Secondary | ICD-10-CM | POA: Insufficient documentation

## 2011-11-16 LAB — FERRITIN: Ferritin: 600 ng/mL — ABNORMAL HIGH (ref 22–322)

## 2011-11-16 LAB — POCT HEMOGLOBIN-HEMACUE: Hemoglobin: 11.2 g/dL — ABNORMAL LOW (ref 13.0–17.0)

## 2011-11-16 LAB — IRON AND TIBC: TIBC: 258 ug/dL (ref 215–435)

## 2011-11-16 MED ORDER — DARBEPOETIN ALFA-POLYSORBATE 40 MCG/0.4ML IJ SOLN
INTRAMUSCULAR | Status: AC
  Administered 2011-11-16: 40 ug via SUBCUTANEOUS
  Filled 2011-11-16: qty 0.4

## 2011-11-16 MED ORDER — DARBEPOETIN ALFA-POLYSORBATE 500 MCG/ML IJ SOLN
40.0000 ug | INTRAMUSCULAR | Status: DC
  Filled 2011-11-16: qty 1

## 2011-11-23 ENCOUNTER — Ambulatory Visit (INDEPENDENT_AMBULATORY_CARE_PROVIDER_SITE_OTHER): Payer: Medicare Other | Admitting: Internal Medicine

## 2011-11-23 ENCOUNTER — Encounter: Payer: Self-pay | Admitting: Internal Medicine

## 2011-11-23 VITALS — BP 118/80 | HR 83 | Ht 73.0 in | Wt 284.4 lb

## 2011-11-23 DIAGNOSIS — G4730 Sleep apnea, unspecified: Secondary | ICD-10-CM

## 2011-11-23 DIAGNOSIS — J9610 Chronic respiratory failure, unspecified whether with hypoxia or hypercapnia: Secondary | ICD-10-CM

## 2011-11-23 NOTE — Patient Instructions (Signed)
Continue present meds. Please call as needed  Consider trying otc antihistamine Claritin/ loratadine for runny nose and pollen allergy

## 2011-11-23 NOTE — Progress Notes (Signed)
Patient ID: [name] [name], male    DOB: [birth year], 61 y.o.   MRN: [id number]  HPI 01/23/11- COPD, CAD/diastolic dysfunction, OSA, chronic respiratory failure, obesity Hypoventilation, cor pulmonale Hosp since last here- once for "shakes"-they told him wasn't using CPAP right, once for epistaxis, once to place urinary stent, and has had a right ? renal biopsy. Told anemic.  Breathing stable, but can't lie flat- feels smothered even on O2.  Runs O2 2-3L/M usually. I gave permision to go to 4 during exertion. Notes "tiresome" feeling midchest, related to exertion/ climbing stairs and relieved by rest. Told to pace himself and not try to push through that.  Discussed need for O2. He doesn't sleep well with his BiPAP 15/12. Discussed sleep hygiene- discussed room temperature. He was more comfortable with autotitration in hosp and we discussed a trial of that at home.  Has restarted diuretic since leg edema has come back.   05/25/11-  COPD, CAD/diastolic dysfunction, OSA, chronic respiratory failure, obesity Hypoventilation, cor pulmonale Went to ER last week- short of breath. Air wasn't satisfying. Was started back on shot for anemia. Feels some better.  Reports sneezing, postnasal drainage and a sense of mucus in his upper chest. Denies fever, sore throat and doesn't think he has a cold.  06/28/11-  COPD, CAD/diastolic dysfunction, OSA, chronic respiratory failure, obesity Hypoventilation, cor pulmonale Recently hospitalized October 25-29, notes reviewed with him and x-ray images reviewed by me. He was hospitalized for hypercapnic respiratory failure with transient encephalopathy and renal insufficiency. Since discharge he has regained some ankle edema while off of furosemide. He has a nephrology appointment later this month. He is concerned that he was taken off his diabetes medicines and is directed to discuss this with his primary physician immediately. He has some persistent soreness across his  anterior chest wall at the level of the xiphoid but otherwise breathing better with less exertional dyspnea and before he went in the hospital. He remains dependent on continuous oxygen at 2 L. He continues his BiPAP machine all night, every night but says he was sleeping better with it  on autotitration with a lower pressure used in the hospital.  09/25/11- COPD, CAD/diastolic dysfunction, OSA, chronic respiratory failure, obesity Hypoventilation, cor pulmonale Since last here he was hospitalized for respiratory failure with CHF and obesity hypoventilation. Better with diuresis. BiPAP 13/10 is now comfortable and used all night every night with supplemental oxygen 4 L. Tolerated colonoscopy without respiratory distress. Uses a rescue inhaler only occasionally but says it does help then.  11/23/11- COPD, CAD/diastolic dysfunction, OSA, chronic respiratory failure, obesity Hypoventilation, cor pulmonale She is noticing some increased shortness of breath on exertion such as getting in the car but no increase in ankle edema which is well controlled. Occasional cough is not progressive or productive. Noticing more rhinorrhea with the pollen. Continues BiPAP  I 13/E 10 all night every night.   Review of Systems-see HPI Constitutional:   +  weight loss,  No-night sweats, fevers, chills, fatigue, lassitude. HEENT:   No-  headaches, difficulty swallowing, tooth/dental problems, sore throat,       +  sneezing, itching, ear ache, nasal congestion, post nasal drip,  CV:  No-   chest pain, orthopnea, PND,  Much less-swelling in lower extremities, anasarca, dizziness, palpitations Resp: + shortness of breath with exertion or at rest.              No-   productive cough,  + non-productive cough,  No-  coughing up of blood.              No-   change in color of mucus.  No- wheezing.   Skin: No-   rash or lesions. GI:  No-   heartburn, indigestion, abdominal pain, nausea, vomiting,  GU: MS:  No-   joint pain or  swelling.  Neuro- grossly normal to observation, Or:  Psych:  No- change in mood or affect. No depression or anxiety.  No memory loss.     Objective:   Physical Exam   General- Alert, Oriented, Affect-appropriate, Distress- none acute;  Obese,   Portable pulse O2 2.5L, sat 93% Skin- rash-none, lesions- none, excoriation- none Lymphadenopathy- none Head- atraumatic            Eyes- Gross vision intact, PERRLA, conjunctivae clear secretions            Ears- Hearing, canals- normal            Nose- Clear, No-Septal dev mucus, polyps, erosion, perforation             Throat- Mallampati II , mucosa clear , drainage- none, tonsils- atrophic Neck- flexible , trachea midline, no stridor , thyroid nl, carotid no bruit Chest - symmetrical excursion , unlabored           Heart/CV- RRR , 1/6 systolic murmur , no gallop  , no rub, nl s1 s2                           - JVD- none , edema- none on today's exam, stasis changes- none,  varices- none           Lung- clear to P&A/ distant, wheeze- none, cough- none , dullness-none, rub- none. No rales heard.           Chest wall-  Abd- Br/ Gen/ Rectal- Not done, not indicated Extrem- cyanosis- none, clubbing, none, atrophy- none, strength- nl Neuro- grossly intact to observation

## 2011-11-28 ENCOUNTER — Encounter: Payer: Self-pay | Admitting: Internal Medicine

## 2011-11-28 NOTE — Assessment & Plan Note (Signed)
He is most sensitive to changes in fluid retention status. Less aware of whether and temperature changes.

## 2011-11-28 NOTE — Assessment & Plan Note (Signed)
Continues good compliance and control 

## 2011-11-30 ENCOUNTER — Encounter (HOSPITAL_COMMUNITY)
Admission: RE | Admit: 2011-11-30 | Discharge: 2011-11-30 | Disposition: A | Discharge status: Home / Self Care | Payer: Medicare Other | Source: Ambulatory Visit | Attending: Nephrology | Admitting: Nephrology

## 2011-11-30 LAB — POCT HEMOGLOBIN-HEMACUE: Hemoglobin: 10.4 g/dL — ABNORMAL LOW (ref 13.0–17.0)

## 2011-11-30 MED ORDER — DARBEPOETIN ALFA-POLYSORBATE 40 MCG/0.4ML IJ SOLN
INTRAMUSCULAR | Status: AC
  Administered 2011-11-30: 40 ug via SUBCUTANEOUS
  Filled 2011-11-30: qty 0.4

## 2011-11-30 MED ORDER — DARBEPOETIN ALFA-POLYSORBATE 500 MCG/ML IJ SOLN
40.0000 ug | INTRAMUSCULAR | Status: DC
  Filled 2011-11-30: qty 1

## 2011-12-14 ENCOUNTER — Encounter (HOSPITAL_COMMUNITY)
Admission: RE | Admit: 2011-12-14 | Discharge: 2011-12-14 | Disposition: A | Discharge status: Home / Self Care | Payer: Medicare Other | Source: Ambulatory Visit | Attending: Nephrology | Admitting: Nephrology

## 2011-12-14 DIAGNOSIS — D638 Anemia in other chronic diseases classified elsewhere: Secondary | ICD-10-CM | POA: Insufficient documentation

## 2011-12-14 DIAGNOSIS — N183 Chronic kidney disease, stage 3 unspecified: Secondary | ICD-10-CM | POA: Insufficient documentation

## 2011-12-14 LAB — IRON AND TIBC: TIBC: 269 ug/dL (ref 215–435)

## 2011-12-14 LAB — FERRITIN: Ferritin: 543 ng/mL — ABNORMAL HIGH (ref 22–322)

## 2011-12-14 MED ORDER — DARBEPOETIN ALFA-POLYSORBATE 25 MCG/0.42ML IJ SOLN
INTRAMUSCULAR | Status: AC
  Filled 2011-12-14: qty 0.42

## 2011-12-14 MED ORDER — DARBEPOETIN ALFA-POLYSORBATE 500 MCG/ML IJ SOLN
40.0000 ug | INTRAMUSCULAR | Status: DC
  Filled 2011-12-14: qty 1

## 2011-12-14 MED ORDER — DARBEPOETIN ALFA-POLYSORBATE 40 MCG/0.4ML IJ SOLN
INTRAMUSCULAR | Status: AC
  Administered 2011-12-14: 40 ug via SUBCUTANEOUS
  Filled 2011-12-14: qty 0.4

## 2011-12-28 ENCOUNTER — Encounter (HOSPITAL_COMMUNITY)
Admission: RE | Admit: 2011-12-28 | Discharge: 2011-12-28 | Disposition: A | Discharge status: Home / Self Care | Payer: Medicare Other | Source: Ambulatory Visit | Attending: Nephrology | Admitting: Nephrology

## 2011-12-28 MED ORDER — DARBEPOETIN ALFA-POLYSORBATE 40 MCG/0.4ML IJ SOLN
INTRAMUSCULAR | Status: AC
  Administered 2011-12-28: 40 ug via SUBCUTANEOUS
  Filled 2011-12-28: qty 0.4

## 2011-12-28 MED ORDER — DARBEPOETIN ALFA-POLYSORBATE 500 MCG/ML IJ SOLN
40.0000 ug | INTRAMUSCULAR | Status: DC
  Filled 2011-12-28: qty 1

## 2011-12-29 LAB — POCT HEMOGLOBIN-HEMACUE: Hemoglobin: 10.1 g/dL — ABNORMAL LOW (ref 13.0–17.0)

## 2012-01-11 ENCOUNTER — Encounter (HOSPITAL_COMMUNITY): Payer: Medicare Other

## 2012-01-14 ENCOUNTER — Emergency Department (HOSPITAL_COMMUNITY): Payer: Medicare Other

## 2012-01-14 ENCOUNTER — Inpatient Hospital Stay (HOSPITAL_COMMUNITY)
Admission: EM | Admit: 2012-01-14 | Discharge: 2012-01-23 | DRG: 208 | Disposition: A | Discharge status: Home / Self Care | Payer: Medicare Other | Attending: Pulmonary Disease | Admitting: Pulmonary Disease

## 2012-01-14 ENCOUNTER — Encounter (HOSPITAL_COMMUNITY): Payer: Self-pay

## 2012-01-14 DIAGNOSIS — E785 Hyperlipidemia, unspecified: Secondary | ICD-10-CM | POA: Diagnosis present

## 2012-01-14 DIAGNOSIS — J159 Unspecified bacterial pneumonia: Secondary | ICD-10-CM

## 2012-01-14 DIAGNOSIS — I5032 Chronic diastolic (congestive) heart failure: Secondary | ICD-10-CM | POA: Diagnosis present

## 2012-01-14 DIAGNOSIS — J9612 Chronic respiratory failure with hypercapnia: Secondary | ICD-10-CM | POA: Diagnosis present

## 2012-01-14 DIAGNOSIS — J9611 Chronic respiratory failure with hypoxia: Secondary | ICD-10-CM | POA: Diagnosis present

## 2012-01-14 DIAGNOSIS — I120 Hypertensive chronic kidney disease with stage 5 chronic kidney disease or end stage renal disease: Secondary | ICD-10-CM

## 2012-01-14 DIAGNOSIS — G9340 Encephalopathy, unspecified: Secondary | ICD-10-CM | POA: Diagnosis present

## 2012-01-14 DIAGNOSIS — I509 Heart failure, unspecified: Secondary | ICD-10-CM | POA: Diagnosis present

## 2012-01-14 DIAGNOSIS — G4733 Obstructive sleep apnea (adult) (pediatric): Secondary | ICD-10-CM | POA: Diagnosis present

## 2012-01-14 DIAGNOSIS — Z833 Family history of diabetes mellitus: Secondary | ICD-10-CM

## 2012-01-14 DIAGNOSIS — E678 Other specified hyperalimentation: Secondary | ICD-10-CM

## 2012-01-14 DIAGNOSIS — K219 Gastro-esophageal reflux disease without esophagitis: Secondary | ICD-10-CM | POA: Diagnosis present

## 2012-01-14 DIAGNOSIS — N183 Chronic kidney disease, stage 3 unspecified: Secondary | ICD-10-CM | POA: Diagnosis present

## 2012-01-14 DIAGNOSIS — I519 Heart disease, unspecified: Secondary | ICD-10-CM

## 2012-01-14 DIAGNOSIS — G4730 Sleep apnea, unspecified: Secondary | ICD-10-CM

## 2012-01-14 DIAGNOSIS — Z79899 Other long term (current) drug therapy: Secondary | ICD-10-CM

## 2012-01-14 DIAGNOSIS — E8729 Other acidosis: Secondary | ICD-10-CM

## 2012-01-14 DIAGNOSIS — I5031 Acute diastolic (congestive) heart failure: Secondary | ICD-10-CM

## 2012-01-14 DIAGNOSIS — J9610 Chronic respiratory failure, unspecified whether with hypoxia or hypercapnia: Secondary | ICD-10-CM

## 2012-01-14 DIAGNOSIS — E874 Mixed disorder of acid-base balance: Secondary | ICD-10-CM | POA: Diagnosis not present

## 2012-01-14 DIAGNOSIS — M109 Gout, unspecified: Secondary | ICD-10-CM | POA: Diagnosis present

## 2012-01-14 DIAGNOSIS — N189 Chronic kidney disease, unspecified: Secondary | ICD-10-CM | POA: Diagnosis present

## 2012-01-14 DIAGNOSIS — E872 Acidosis: Secondary | ICD-10-CM

## 2012-01-14 DIAGNOSIS — J9600 Acute respiratory failure, unspecified whether with hypoxia or hypercapnia: Secondary | ICD-10-CM

## 2012-01-14 DIAGNOSIS — E1159 Type 2 diabetes mellitus with other circulatory complications: Secondary | ICD-10-CM | POA: Diagnosis present

## 2012-01-14 DIAGNOSIS — Z7902 Long term (current) use of antithrombotics/antiplatelets: Secondary | ICD-10-CM

## 2012-01-14 DIAGNOSIS — I279 Pulmonary heart disease, unspecified: Secondary | ICD-10-CM | POA: Diagnosis present

## 2012-01-14 DIAGNOSIS — I2510 Atherosclerotic heart disease of native coronary artery without angina pectoris: Secondary | ICD-10-CM | POA: Diagnosis present

## 2012-01-14 DIAGNOSIS — N179 Acute kidney failure, unspecified: Secondary | ICD-10-CM | POA: Diagnosis present

## 2012-01-14 DIAGNOSIS — I959 Hypotension, unspecified: Secondary | ICD-10-CM | POA: Diagnosis not present

## 2012-01-14 DIAGNOSIS — J440 Chronic obstructive pulmonary disease with acute lower respiratory infection: Principal | ICD-10-CM | POA: Diagnosis present

## 2012-01-14 DIAGNOSIS — I739 Peripheral vascular disease, unspecified: Secondary | ICD-10-CM

## 2012-01-14 DIAGNOSIS — G9341 Metabolic encephalopathy: Secondary | ICD-10-CM | POA: Diagnosis present

## 2012-01-14 DIAGNOSIS — E873 Alkalosis: Secondary | ICD-10-CM

## 2012-01-14 DIAGNOSIS — J449 Chronic obstructive pulmonary disease, unspecified: Secondary | ICD-10-CM | POA: Diagnosis present

## 2012-01-14 DIAGNOSIS — E876 Hypokalemia: Secondary | ICD-10-CM | POA: Diagnosis not present

## 2012-01-14 DIAGNOSIS — N400 Enlarged prostate without lower urinary tract symptoms: Secondary | ICD-10-CM | POA: Diagnosis present

## 2012-01-14 DIAGNOSIS — D649 Anemia, unspecified: Secondary | ICD-10-CM

## 2012-01-14 DIAGNOSIS — D696 Thrombocytopenia, unspecified: Secondary | ICD-10-CM | POA: Diagnosis not present

## 2012-01-14 DIAGNOSIS — J189 Pneumonia, unspecified organism: Secondary | ICD-10-CM

## 2012-01-14 DIAGNOSIS — E662 Morbid (severe) obesity with alveolar hypoventilation: Secondary | ICD-10-CM | POA: Diagnosis present

## 2012-01-14 DIAGNOSIS — Z7982 Long term (current) use of aspirin: Secondary | ICD-10-CM

## 2012-01-14 DIAGNOSIS — D509 Iron deficiency anemia, unspecified: Secondary | ICD-10-CM | POA: Diagnosis present

## 2012-01-14 DIAGNOSIS — J209 Acute bronchitis, unspecified: Principal | ICD-10-CM | POA: Diagnosis present

## 2012-01-14 DIAGNOSIS — E119 Type 2 diabetes mellitus without complications: Secondary | ICD-10-CM | POA: Diagnosis present

## 2012-01-14 DIAGNOSIS — T783XXA Angioneurotic edema, initial encounter: Secondary | ICD-10-CM

## 2012-01-14 DIAGNOSIS — Z951 Presence of aortocoronary bypass graft: Secondary | ICD-10-CM

## 2012-01-14 DIAGNOSIS — Z87891 Personal history of nicotine dependence: Secondary | ICD-10-CM

## 2012-01-14 DIAGNOSIS — E870 Hyperosmolality and hypernatremia: Secondary | ICD-10-CM | POA: Diagnosis not present

## 2012-01-14 DIAGNOSIS — IMO0002 Reserved for concepts with insufficient information to code with codable children: Secondary | ICD-10-CM

## 2012-01-14 DIAGNOSIS — R490 Dysphonia: Secondary | ICD-10-CM | POA: Diagnosis not present

## 2012-01-14 DIAGNOSIS — E669 Obesity, unspecified: Secondary | ICD-10-CM | POA: Diagnosis present

## 2012-01-14 DIAGNOSIS — I129 Hypertensive chronic kidney disease with stage 1 through stage 4 chronic kidney disease, or unspecified chronic kidney disease: Secondary | ICD-10-CM | POA: Diagnosis present

## 2012-01-14 DIAGNOSIS — J9620 Acute and chronic respiratory failure, unspecified whether with hypoxia or hypercapnia: Secondary | ICD-10-CM | POA: Diagnosis present

## 2012-01-14 DIAGNOSIS — I10 Essential (primary) hypertension: Secondary | ICD-10-CM

## 2012-01-14 DIAGNOSIS — R0902 Hypoxemia: Secondary | ICD-10-CM | POA: Diagnosis present

## 2012-01-14 DIAGNOSIS — J9601 Acute respiratory failure with hypoxia: Secondary | ICD-10-CM

## 2012-01-14 DIAGNOSIS — Z9861 Coronary angioplasty status: Secondary | ICD-10-CM

## 2012-01-14 LAB — DIFFERENTIAL
Basophils Relative: 0 % (ref 0–1)
Monocytes Relative: 16 % — ABNORMAL HIGH (ref 3–12)
Neutro Abs: 3.9 10*3/uL (ref 1.7–7.7)
Neutrophils Relative %: 65 % (ref 43–77)

## 2012-01-14 LAB — CBC
Hemoglobin: 9.6 g/dL — ABNORMAL LOW (ref 13.0–17.0)
MCHC: 28.3 g/dL — ABNORMAL LOW (ref 30.0–36.0)
Platelets: 121 10*3/uL — ABNORMAL LOW (ref 150–400)
RBC: 3.39 MIL/uL — ABNORMAL LOW (ref 4.22–5.81)

## 2012-01-14 LAB — POCT I-STAT TROPONIN I: Troponin i, poc: 0.01 ng/mL (ref 0.00–0.08)

## 2012-01-14 LAB — BASIC METABOLIC PANEL
BUN: 47 mg/dL — ABNORMAL HIGH (ref 6–23)
Chloride: 98 mEq/L (ref 96–112)
GFR calc Af Amer: 48 mL/min — ABNORMAL LOW (ref >90)
Potassium: 4.9 mEq/L (ref 3.5–5.1)
Sodium: 146 mEq/L — ABNORMAL HIGH (ref 135–145)

## 2012-01-14 LAB — CARDIAC PANEL(CRET KIN+CKTOT+MB+TROPI)
CK, MB: 1.9 ng/mL (ref 0.3–4.0)
Total CK: 36 U/L (ref 7–232)

## 2012-01-14 MED ORDER — ISOSORBIDE MONONITRATE ER 30 MG PO TB24
45.0000 mg | ORAL_TABLET | Freq: Every day | ORAL | Status: DC
  Administered 2012-01-14 – 2012-01-23 (×5): 45 mg via ORAL
  Filled 2012-01-14 (×10): qty 1

## 2012-01-14 MED ORDER — DIAZEPAM 5 MG PO TABS: 5.0000 mg | ORAL_TABLET | Freq: Every evening | ORAL | Status: DC | PRN

## 2012-01-14 MED ORDER — NITROGLYCERIN 2 % TD OINT
1.0000 [in_us] | TOPICAL_OINTMENT | Freq: Four times a day (QID) | TRANSDERMAL | Status: DC
  Filled 2012-01-14: qty 1

## 2012-01-14 MED ORDER — COLCHICINE 0.6 MG PO TABS
0.6000 mg | ORAL_TABLET | Freq: Every day | ORAL | Status: DC
  Administered 2012-01-14 – 2012-01-23 (×9): 0.6 mg via ORAL
  Filled 2012-01-14 (×10): qty 1

## 2012-01-14 MED ORDER — MOXIFLOXACIN HCL IN NACL 400 MG/250ML IV SOLN
400.0000 mg | INTRAVENOUS | Status: DC
  Administered 2012-01-15: 400 mg via INTRAVENOUS
  Filled 2012-01-14 (×2): qty 250

## 2012-01-14 MED ORDER — TIOTROPIUM BROMIDE MONOHYDRATE 18 MCG IN CAPS
18.0000 ug | ORAL_CAPSULE | Freq: Every day | RESPIRATORY_TRACT | Status: DC
  Filled 2012-01-14: qty 5

## 2012-01-14 MED ORDER — ASPIRIN EC 81 MG PO TBEC
81.0000 mg | DELAYED_RELEASE_TABLET | Freq: Every day | ORAL | Status: DC
  Administered 2012-01-14 – 2012-01-23 (×9): 81 mg via ORAL
  Filled 2012-01-14 (×10): qty 1

## 2012-01-14 MED ORDER — ONDANSETRON HCL 4 MG/2ML IJ SOLN: 4.0000 mg | Freq: Four times a day (QID) | INTRAMUSCULAR | Status: DC | PRN

## 2012-01-14 MED ORDER — METOPROLOL TARTRATE 12.5 MG HALF TABLET
12.5000 mg | ORAL_TABLET | Freq: Two times a day (BID) | ORAL | Status: DC
  Administered 2012-01-14 – 2012-01-15 (×2): 12.5 mg via ORAL
  Filled 2012-01-14 (×5): qty 1

## 2012-01-14 MED ORDER — DEXTROSE 5 % IV SOLN: 500.0000 mg | Freq: Once | INTRAVENOUS | Status: DC

## 2012-01-14 MED ORDER — ONDANSETRON HCL 4 MG PO TABS: 4.0000 mg | ORAL_TABLET | Freq: Four times a day (QID) | ORAL | Status: DC | PRN

## 2012-01-14 MED ORDER — INSULIN ASPART 100 UNIT/ML ~~LOC~~ SOLN
0.0000 [IU] | Freq: Three times a day (TID) | SUBCUTANEOUS | Status: DC
  Administered 2012-01-15 (×2): 1 [IU] via SUBCUTANEOUS

## 2012-01-14 MED ORDER — GUAIFENESIN ER 600 MG PO TB12
600.0000 mg | ORAL_TABLET | Freq: Two times a day (BID) | ORAL | Status: DC
  Administered 2012-01-14 – 2012-01-15 (×2): 600 mg via ORAL
  Filled 2012-01-14 (×3): qty 1

## 2012-01-14 MED ORDER — FUROSEMIDE 80 MG PO TABS
80.0000 mg | ORAL_TABLET | Freq: Two times a day (BID) | ORAL | Status: DC
  Administered 2012-01-14 – 2012-01-15 (×3): 80 mg via ORAL
  Filled 2012-01-14 (×4): qty 1

## 2012-01-14 MED ORDER — TAMSULOSIN HCL 0.4 MG PO CAPS
0.4000 mg | ORAL_CAPSULE | Freq: Every day | ORAL | Status: DC
  Administered 2012-01-14 – 2012-01-15 (×2): 0.4 mg via ORAL
  Filled 2012-01-14 (×2): qty 1

## 2012-01-14 MED ORDER — ATORVASTATIN CALCIUM 20 MG PO TABS
20.0000 mg | ORAL_TABLET | Freq: Every day | ORAL | Status: DC
  Administered 2012-01-14 – 2012-01-22 (×9): 20 mg via ORAL
  Filled 2012-01-14 (×11): qty 1

## 2012-01-14 MED ORDER — FLUTICASONE-SALMETEROL 250-50 MCG/DOSE IN AEPB
1.0000 | INHALATION_SPRAY | Freq: Two times a day (BID) | RESPIRATORY_TRACT | Status: DC
  Administered 2012-01-14: 1 via RESPIRATORY_TRACT
  Filled 2012-01-14: qty 14

## 2012-01-14 MED ORDER — ALBUTEROL SULFATE (5 MG/ML) 0.5% IN NEBU
2.5000 mg | INHALATION_SOLUTION | Freq: Four times a day (QID) | RESPIRATORY_TRACT | Status: DC
  Administered 2012-01-14 – 2012-01-15 (×3): 2.5 mg via RESPIRATORY_TRACT
  Filled 2012-01-14 (×3): qty 0.5

## 2012-01-14 MED ORDER — PANTOPRAZOLE SODIUM 40 MG PO TBEC
40.0000 mg | DELAYED_RELEASE_TABLET | Freq: Every day | ORAL | Status: DC
  Administered 2012-01-14 – 2012-01-15 (×2): 40 mg via ORAL
  Filled 2012-01-14: qty 1

## 2012-01-14 MED ORDER — NITROGLYCERIN 0.4 MG SL SUBL: 0.4000 mg | SUBLINGUAL_TABLET | SUBLINGUAL | Status: DC | PRN

## 2012-01-14 MED ORDER — SODIUM CHLORIDE 0.9 % IJ SOLN: 3.0000 mL | INTRAMUSCULAR | Status: DC | PRN

## 2012-01-14 MED ORDER — ACETAMINOPHEN 650 MG RE SUPP: 650.0000 mg | Freq: Four times a day (QID) | RECTAL | Status: DC | PRN

## 2012-01-14 MED ORDER — SODIUM CHLORIDE 0.9 % IJ SOLN
3.0000 mL | Freq: Two times a day (BID) | INTRAMUSCULAR | Status: DC
  Administered 2012-01-14 – 2012-01-16 (×5): 3 mL via INTRAVENOUS

## 2012-01-14 MED ORDER — MOXIFLOXACIN HCL IN NACL 400 MG/250ML IV SOLN: 400.0000 mg | INTRAVENOUS | Status: DC

## 2012-01-14 MED ORDER — CEFTRIAXONE SODIUM 1 G IJ SOLR
1.0000 g | Freq: Once | INTRAMUSCULAR | Status: AC
  Administered 2012-01-14: 1 g via INTRAVENOUS
  Filled 2012-01-14: qty 10

## 2012-01-14 MED ORDER — FUROSEMIDE 10 MG/ML IJ SOLN
80.0000 mg | Freq: Once | INTRAMUSCULAR | Status: AC
  Administered 2012-01-14: 80 mg via INTRAVENOUS
  Filled 2012-01-14: qty 8

## 2012-01-14 MED ORDER — SODIUM CHLORIDE 0.9 % IV SOLN: 250.0000 mL | INTRAVENOUS | Status: DC | PRN

## 2012-01-14 MED ORDER — SODIUM CHLORIDE 0.9 % IJ SOLN
3.0000 mL | Freq: Two times a day (BID) | INTRAMUSCULAR | Status: DC
  Administered 2012-01-17 – 2012-01-18 (×4): 3 mL via INTRAVENOUS
  Administered 2012-01-20: 30 mL via INTRAVENOUS

## 2012-01-14 MED ORDER — AZITHROMYCIN 250 MG PO TABS
500.0000 mg | ORAL_TABLET | Freq: Once | ORAL | Status: AC
  Administered 2012-01-14: 500 mg via ORAL
  Filled 2012-01-14: qty 2

## 2012-01-14 MED ORDER — CLOPIDOGREL BISULFATE 75 MG PO TABS
75.0000 mg | ORAL_TABLET | Freq: Every day | ORAL | Status: DC
  Administered 2012-01-14 – 2012-01-23 (×9): 75 mg via ORAL
  Filled 2012-01-14 (×10): qty 1

## 2012-01-14 MED ORDER — DOCUSATE SODIUM 100 MG PO CAPS
100.0000 mg | ORAL_CAPSULE | Freq: Two times a day (BID) | ORAL | Status: DC
  Administered 2012-01-14 – 2012-01-15 (×2): 100 mg via ORAL
  Filled 2012-01-14 (×3): qty 1

## 2012-01-14 MED ORDER — ACETAMINOPHEN 325 MG PO TABS: 650.0000 mg | ORAL_TABLET | Freq: Four times a day (QID) | ORAL | Status: DC | PRN

## 2012-01-14 NOTE — H&P (Signed)
[facility] History and Physical  MACLEAN FOISTER ZOX:[id number] DOB: 1950/12/14 DOA: 01/14/2012   PCP: [facility] [place], [place], MD   Chief Complaint: SOB and cough.   HPI: 61 year old with PMH significant for Diastolic heart failure, chronic respiratory failure, obesity hypoventilation syndrome, COPD, CAD, S/P bypass grafting in 2001x6. He had an intervention and December 2005, July 2010, August 2010, and most recently July 2011 with Monorail stent, CKD stage 3, Cr baseline 1.7-- 1.8  who presents to ED complaining of worsening SOB and cough for last 3 days. He relates productive cough, greenish sputum. His dose of lasix was recently decrease by his nephrologist due to worsening renal function. He has notices some swelling of legs and scrotum since his lasix dose was decreases. He denies chest pain or fevers.    Review of Systems:  Constitutional:  No weight loss, night sweats, Fevers, chills, fatigue.  HEENT:  No headaches, Difficulty swallowing,Tooth/dental problems,Sore throat,  No sneezing, itching, ear ache, nasal congestion, post nasal drip,  Cardio-vascular:  No chest pain,  GI:  No heartburn, indigestion, , nausea, vomiting, diarrhea, change in bowel habits, loss of appetite   Skin:  no rash or lesions.  GU:  no dysuria, change in color of urine, no urgency or frequency. No flank pain.  Musculoskeletal:  No joint pain or swelling. No decreased range of motion. No back pain.  Psych:  No change in mood or affect. No depression or anxiety. No memory loss.    Past Medical History  Diagnosis Date  . Heart disease, unspecified   . Coronary atherosclerosis of unspecified type of vessel, native or graft   . Chronic pulmonary heart disease, unspecified   . Other hyperalimentation   . Chronic respiratory failure   . Unspecified sleep apnea   . Chronic airway obstruction, not elsewhere classified   . Angina   . Shortness of breath   . Anemia   . Chronic kidney disease   .  Anxiety   . Hypertension   . CHF (congestive heart failure)   . Blood transfusion     no reaction from transfusion  . Arthritis     hx of gout  . Diabetes mellitus   . CORONARY ARTERY DISEASE 10/28/2007  . CPAP (continuous positive airway pressure) dependence   . GERD (gastroesophageal reflux disease)    Past Surgical History  Procedure Date  . Coronary artery bypass graft   . Appendectomy   . Bilateral vats ablation   . Coronary angioplasty with stent placement   . Hemmorhoids   . Lung sx 2005    Growth on outside of right lung  . Esophagogastroduodenoscopy 08/11/2011    Procedure: ESOPHAGOGASTRODUODENOSCOPY (EGD);  Surgeon: Barrie Folk, MD;  Location: [facility] ENDOSCOPY;  Service: Endoscopy;  Laterality: N/A;  . Colonoscopy 08/11/2011    Procedure: COLONOSCOPY;  Surgeon: Barrie Folk, MD;  Location: [facility] ENDOSCOPY;  Service: Endoscopy;  Laterality: N/A;   Social History:  reports that he quit smoking about 13 years ago. He has never used smokeless tobacco. He reports that he drinks alcohol. He reports that he does not use illicit drugs.  Allergies  Allergen Reactions  . Amlodipine Besy-Benazepril Hcl Swelling    Lips swell  . Percocet (Oxycodone-Acetaminophen) Other (See Comments)    hallucinations    Family History  Problem Relation Age of Onset  . Diabetes Sister     Prior to Admission medications   Medication Sig Start Date End Date Taking? Authorizing Provider  albuterol (PROVENTIL HFA;VENTOLIN HFA) 108 (90 BASE) MCG/ACT inhaler Inhale 2 puffs into the lungs every 6 (six) hours as needed. For wheezing   Yes Historical Provider, MD  aspirin EC 81 MG tablet Take 81 mg by mouth daily.   Yes Historical Provider, MD  clopidogrel (PLAVIX) 75 MG tablet Take 75 mg by mouth daily.   Yes Historical Provider, MD  COLCRYS 0.6 MG tablet Take 1 tablet by mouth daily. 01/02/11  Yes Historical Provider, MD  diazepam (VALIUM) 5 MG tablet Take 5 mg by mouth at bedtime as needed. For  anxiety/sleep   Yes Historical Provider, MD  Fluticasone-Salmeterol (ADVAIR) 250-50 MCG/DOSE AEPB Inhale 1 puff into the lungs every 12 (twelve) hours as needed. For wheezing   Yes Historical Provider, MD  furosemide (LASIX) 80 MG tablet Take 80 mg by mouth 2 (two) times daily.  07/22/11  Yes Abelino Derrick, PA  isosorbide mononitrate (IMDUR) 30 MG 24 hr tablet Take 45 mg by mouth daily.    Yes Historical Provider, MD  metFORMIN (GLUCOPHAGE-XR) 750 MG 24 hr tablet Take 750 mg by mouth 2 (two) times daily.    Yes Historical Provider, MD  metoprolol tartrate (LOPRESSOR) 25 MG tablet Take 12.5 mg by mouth 2 (two) times daily. 07/22/11  Yes Abelino Derrick, PA  Multiple Vitamin (MULITIVITAMIN WITH MINERALS) TABS Take 1 tablet by mouth daily.   Yes Historical Provider, MD  nitroGLYCERIN (NITROSTAT) 0.4 MG SL tablet Place 0.4 mg under the tongue every 5 (five) minutes as needed. For chest pain   Yes Historical Provider, MD  pantoprazole (PROTONIX) 40 MG tablet Take 1 tablet by mouth daily. 12/18/10  Yes Historical Provider, MD  potassium chloride SA (K-DUR,KLOR-CON) 20 MEQ tablet Take 20 mEq by mouth 2 (two) times daily.    Yes Historical Provider, MD  rosuvastatin (CRESTOR) 20 MG tablet Take 20 mg by mouth daily.    Yes Historical Provider, MD  Tamsulosin HCl (FLOMAX) 0.4 MG CAPS Take 1 tablet by mouth daily. 12/16/10  Yes Historical Provider, MD  tiotropium (SPIRIVA) 18 MCG inhalation capsule Place 18 mcg into inhaler and inhale daily as needed. For wheezing 07/22/11 07/21/12 Yes Abelino Derrick, PA   Physical Exam: Filed Vitals:   01/14/12 1300 01/14/12 1447 01/14/12 1500 01/14/12 1600  BP: 130/59 135/64 126/63 130/68  Pulse: 100 107 107 103  Temp:      TempSrc:      Resp:  22 27 23   SpO2: 95% 97% 96% 99%   BP 130/68  Pulse 103  Temp(Src) 97.9 F (36.6 C) (Oral)  Resp 23  SpO2 99%  General Appearance:    Alert, cooperative, no distress, appears stated age  Head:    Normocephalic, without obvious  abnormality, atraumatic  Eyes:    PERRL, conjunctiva/corneas clear, EOM's intact,       Ears:    Normal TM's and external ear canals, both ears  Nose:   Nares normal, septum midline, mucosa normal, no drainage    or sinus tenderness  Throat:   Lips, mucosa, and tongue normal; teeth and gums normal  Neck:   Supple, symmetrical, trachea midline, no adenopathy;       thyroid:  No enlargement/tenderness/nodules; no carotid   bruit , positive JVD  Back:     Symmetric, no curvature, ROM normal, no CVA tenderness  Lungs:     Decreases sounds, bilateral crackles, no wheezes, respirations unlabored  Chest wall:    No tenderness or deformity  Heart:  Regular rate and rhythm, S1 and S2 normal, no murmur, rub   or gallop  Abdomen:     Soft, non-tender, bowel sounds active all four quadrants,    no masses, no organomegaly        Extremities:   Extremities trace edema, atraumatic, no cyanosis or edema  Pulses:   2+ and symmetric all extremities  Skin:   Skin color, texture, turgor normal, no rashes or lesions  Lymph nodes:   Cervical, supraclavicular, and axillary nodes normal  Neurologic:   CNII-XII intact. Normal strength, sensation and reflexes      throughout    Labs on Admission:  Basic Metabolic Panel:  Lab 01/14/12 1478  NA 146*  K 4.9  CL 98  CO2 >45*  GLUCOSE 125*  BUN 47*  CREATININE 1.73*  CALCIUM 9.8  MG --  PHOS --   CBC:  Lab 01/14/12 1405  WBC 5.9  NEUTROABS 3.9  HGB 9.6*  HCT 33.9*  MCV 100.0  PLT 121*    Radiological Exams on Admission: Dg Chest 2 View  01/14/2012  *RADIOLOGY REPORT*  Clinical Data: Cough, congestion, shortness of breath, history of CHF  CHEST - 2 VIEW  Comparison: 07/16/2011  Findings: Prior coronary bypass changes noted.  Heart is enlarged with diffuse airspace disease/edema with small effusions.  CHF is favored.  Inferior calcified pleural plaques noted bilaterally.  No pneumothorax.  Trachea midline.  IMPRESSION: Cardiomegaly with diffuse  interstitial edema pattern and small effusions compatible with CHF.  Bilateral calcified pleural plaques  Original Report Authenticated By: Judie Petit. Ruel Favors, M.D.    EKG: Sinus tachycardia, RBBB.   Assessment/Plan:  CORONARY ARTERY DISEASE, CABG 2001, last PCI 7/11  DIASTOLIC DYSFUNCTION  C O P D  RESPIRATORY FAILURE, CHRONIC, on BiPap  Diabetes mellitus  1- Acute on Chronic Respiratory failure: Patient presents with SOB, productive cough. No leukocytosis, no significant increase BNP. Respiratory failure multifactorial component PNA, mild acute diastolic HF exacerbation. I will continue with Avelox. Will check sputum culture. Will give 1 dose IV lasix and then will resume prior home dose of lasix 80 mg BID. Will monitor renal function and adjust lasix as needed. No evidence of COPD exacerbation, no wheezes on lung exam. Patient stable to be admitted to telemetry. BIPAP QHS.  2-Acute diastolic hear failure, mild exacerbation, Chest x ray with pulmonary edema, patient relates increase fluids retention. I will give 1 dose of IV lasix. Resume prior home dose of lasix tomorrow. Cycle cardiac enzymes.   3-CKD stage 3: Per record last Cr at 1.7- 1.8. Monitor renal function on increase lasix dose.  4-COPD: Continue with nebulizer treatments and ipratropium, advair.  5-Diabetes: I will discontinue Metformin, contraindicated in renal failure. I will check HB A1c SSI. Will consider other oral options or insulin.  6-PNA, Community acquired: Will treat for presume PNA. Avelox, Sputum culture.  7-Thrombocytopenia; mild, chronic.  8-Anemia Iron deficiency   Keimora Swartout, MD  [facility] Pager [phone]  If 7PM-7AM, please contact night-coverage [email or web address] Password [facility] 01/14/2012, 4:41 PM

## 2012-01-14 NOTE — Progress Notes (Signed)
Patient placed on bipap ipap 14cmH20 and epap of 7 cmH20 with 4 lpm 02 bleed in. Patient's sat 95% and HR 100. Patient is tolerating bipap at this time. RT will continue to monitor.

## 2012-01-14 NOTE — ED Provider Notes (Addendum)
Complains of shortness of breath accompanied by cough productive of green and white sputum onset 3 days ago S. no fever admits to mild nasal congestion. No chest pain no other complaint patient. On exam no respiratory distress lungs diffuse rhonchi speaks in paragraphs Chest X-ray reviewed by me and discuss with radiologist. Patient has elements of CHF but may also have left lower lobe infiltrate consistent with pneumonia.  Doug Sou, MD 01/14/12 1527 Medical decision making: Patient not given additional Lasix as appears mildly dehydrated supported by renal insufficiency and hypernatremia, will treat congestive heart failure with nitroglycerin paste.BNP ordered at request of admitting physician, who will check result  Doug Sou, MD 01/14/12 1549

## 2012-01-14 NOTE — ED Provider Notes (Signed)
Medical screening examination/treatment/procedure(s) were conducted as a shared visit with non-physician practitioner(s) and myself.  I personally evaluated the patient during the encounter  Doug Sou, MD 01/14/12 1654

## 2012-01-14 NOTE — Progress Notes (Signed)
01/14/12 1609  OTHER  CSW Follow Up Status No follow-up needed

## 2012-01-14 NOTE — ED Provider Notes (Signed)
History     CSN: [id number]  Arrival date & time 01/14/12  1217   First MD Initiated Contact with Patient 01/14/12 1312      2:05 PM HPI  Spouse reports wet cough with productive green sputum for 3 days. It was associated with shortness of breath worse with activity. Patient also reports mild chest pain when coughing. Denies fever. Reports a significant history of 6 stents, COPD CHF. States he was taking Lasix that was discontinued 2 weeks ago do to increase kidney function.  Patient is a 61 y.o. male presenting with cough. The history is provided by the spouse and the patient.  Cough This is a new problem. Episode onset: 3 days ago. The problem occurs every few minutes. The problem has been gradually worsening. The cough is productive of sputum. There has been no fever. Associated symptoms include shortness of breath. Pertinent negatives include no chest pain, no chills, no ear pain, no rhinorrhea, no sore throat, no myalgias and no wheezing. He has tried nothing for the symptoms. He is not a smoker. His past medical history is significant for COPD.    Past Medical History  Diagnosis Date  . Heart disease, unspecified   . Coronary atherosclerosis of unspecified type of vessel, native or graft   . Chronic pulmonary heart disease, unspecified   . Other hyperalimentation   . Chronic respiratory failure   . Unspecified sleep apnea   . Chronic airway obstruction, not elsewhere classified   . Angina   . Shortness of breath   . Anemia   . Chronic kidney disease   . Anxiety   . Hypertension   . CHF (congestive heart failure)   . Blood transfusion     no reaction from transfusion  . Arthritis     hx of gout  . Diabetes mellitus   . CORONARY ARTERY DISEASE 10/28/2007  . CPAP (continuous positive airway pressure) dependence   . GERD (gastroesophageal reflux disease)     Past Surgical History  Procedure Date  . Coronary artery bypass graft   . Appendectomy   . Bilateral vats ablation    . Coronary angioplasty with stent placement   . Hemmorhoids   . Lung sx 2005    Growth on outside of right lung  . Esophagogastroduodenoscopy 08/11/2011    Procedure: ESOPHAGOGASTRODUODENOSCOPY (EGD);  Surgeon: Barrie Folk, MD;  Location: [facility] ENDOSCOPY;  Service: Endoscopy;  Laterality: N/A;  . Colonoscopy 08/11/2011    Procedure: COLONOSCOPY;  Surgeon: Barrie Folk, MD;  Location: [facility] ENDOSCOPY;  Service: Endoscopy;  Laterality: N/A;    Family History  Problem Relation Age of Onset  . Diabetes Sister     History  Substance Use Topics  . Smoking status: Former Smoker    Quit date: 08/14/1998  . Smokeless tobacco: Never Used  . Alcohol Use: Yes     social      Review of Systems  Constitutional: Negative for chills.  HENT: Negative for ear pain, sore throat and rhinorrhea.   Respiratory: Positive for cough and shortness of breath. Negative for wheezing.   Cardiovascular: Negative for chest pain.  Musculoskeletal: Negative for myalgias.  Neurological: Positive for weakness.  All other systems reviewed and are negative.    Allergies  Amlodipine besy-benazepril hcl and Percocet  Home Medications   Current Outpatient Rx  Name Route Sig Dispense Refill  . ALBUTEROL SULFATE HFA 108 (90 BASE) MCG/ACT IN AERS Inhalation Inhale 2 puffs into the lungs every 6 (six)  hours as needed. For wheezing    . ASPIRIN EC 81 MG PO TBEC Oral Take 81 mg by mouth daily.    Marland Kitchen CLOPIDOGREL BISULFATE 75 MG PO TABS Oral Take 75 mg by mouth daily.    Marland Kitchen COLCRYS 0.6 MG PO TABS Oral Take 1 tablet by mouth daily.    Marland Kitchen DIAZEPAM 5 MG PO TABS Oral Take 5 mg by mouth at bedtime as needed. For anxiety/sleep    . FLUTICASONE-SALMETEROL 250-50 MCG/DOSE IN AEPB Inhalation Inhale 1 puff into the lungs every 12 (twelve) hours as needed. For wheezing    . FUROSEMIDE 80 MG PO TABS Oral Take 80 mg by mouth 2 (two) times daily.     . ISOSORBIDE MONONITRATE ER 30 MG PO TB24 Oral Take 45 mg by mouth daily.     Marland Kitchen  METFORMIN HCL ER 750 MG PO TB24 Oral Take 750 mg by mouth 2 (two) times daily.     Marland Kitchen METOPROLOL TARTRATE 25 MG PO TABS Oral Take 12.5 mg by mouth 2 (two) times daily.    . ADULT MULTIVITAMIN W/MINERALS CH Oral Take 1 tablet by mouth daily.    Marland Kitchen NITROGLYCERIN 0.4 MG SL SUBL Sublingual Place 0.4 mg under the tongue every 5 (five) minutes as needed. For chest pain    . PANTOPRAZOLE SODIUM 40 MG PO TBEC Oral Take 1 tablet by mouth daily.    Marland Kitchen POTASSIUM CHLORIDE CRYS ER 20 MEQ PO TBCR Oral Take 20 mEq by mouth 2 (two) times daily.     Marland Kitchen ROSUVASTATIN CALCIUM 20 MG PO TABS Oral Take 20 mg by mouth daily.     Marland Kitchen TAMSULOSIN HCL 0.4 MG PO CAPS Oral Take 1 tablet by mouth daily.    Marland Kitchen TIOTROPIUM BROMIDE MONOHYDRATE 18 MCG IN CAPS Inhalation Place 18 mcg into inhaler and inhale daily as needed. For wheezing      BP 138/87  Pulse 81  Temp(Src) 97.9 F (36.6 C) (Oral)  Resp 22  SpO2 96%  Physical Exam  Vitals reviewed. Constitutional: He is oriented to person, place, and time. He appears well-developed and well-nourished.  HENT:  Head: Normocephalic and atraumatic.  Eyes: Conjunctivae are normal. Pupils are equal, round, and reactive to light.  Neck: Normal range of motion. Neck supple.  Cardiovascular: Normal rate, regular rhythm and normal heart sounds.  Exam reveals no gallop and no friction rub.   No murmur heard. Pulmonary/Chest: Effort normal. No respiratory distress. He has no wheezes. He has rales (Bilateral. But patient poorly follows instructions to breath deeply). He exhibits no tenderness.       Wet cough but speaks in full sentences  Abdominal: Soft. Bowel sounds are normal.  Neurological: He is alert and oriented to person, place, and time.  Skin: Skin is warm and dry. No rash noted. No erythema. No pallor.  Psychiatric: He has a normal mood and affect. His behavior is normal.    ED Course  Procedures  Results for orders placed during the hospital encounter of 01/14/12  CBC       Component Value Range   WBC 5.9  4.0 - 10.5 (K/uL)   RBC 3.39 (*) 4.22 - 5.81 (MIL/uL)   Hemoglobin 9.6 (*) 13.0 - 17.0 (g/dL)   HCT 16.1 (*) 09.6 - 52.0 (%)   MCV 100.0  78.0 - 100.0 (fL)   MCH 28.3  26.0 - 34.0 (pg)   MCHC 28.3 (*) 30.0 - 36.0 (g/dL)   RDW 04.5  40.9 -  15.5 (%)   Platelets 121 (*) 150 - 400 (K/uL)  DIFFERENTIAL      Component Value Range   Neutrophils Relative 65  43 - 77 (%)   Neutro Abs 3.9  1.7 - 7.7 (K/uL)   Lymphocytes Relative 18  12 - 46 (%)   Lymphs Abs 1.0  0.7 - 4.0 (K/uL)   Monocytes Relative 16 (*) 3 - 12 (%)   Monocytes Absolute 0.9  0.1 - 1.0 (K/uL)   Eosinophils Relative 1  0 - 5 (%)   Eosinophils Absolute 0.1  0.0 - 0.7 (K/uL)   Basophils Relative 0  0 - 1 (%)   Basophils Absolute 0.0  0.0 - 0.1 (K/uL)  BASIC METABOLIC PANEL      Component Value Range   Sodium 146 (*) 135 - 145 (mEq/L)   Potassium 4.9  3.5 - 5.1 (mEq/L)   Chloride 98  96 - 112 (mEq/L)   CO2 >45 (*) 19 - 32 (mEq/L)   Glucose, Bld 125 (*) 70 - 99 (mg/dL)   BUN 47 (*) 6 - 23 (mg/dL)   Creatinine, Ser 1.61 (*) 0.50 - 1.35 (mg/dL)   Calcium 9.8  8.4 - 09.6 (mg/dL)   GFR calc non Af Amer 41 (*) >90 (mL/min)   GFR calc Af Amer 48 (*) >90 (mL/min)  POCT I-STAT TROPONIN I      Component Value Range   Troponin i, poc 0.01  0.00 - 0.08 (ng/mL)   Comment 3            Dg Chest 2 View  01/14/2012  *RADIOLOGY REPORT*  Clinical Data: Cough, congestion, shortness of breath, history of CHF  CHEST - 2 VIEW  Comparison: 07/16/2011  Findings: Prior coronary bypass changes noted.  Heart is enlarged with diffuse airspace disease/edema with small effusions.  CHF is favored.  Inferior calcified pleural plaques noted bilaterally.  No pneumothorax.  Trachea midline.  IMPRESSION: Cardiomegaly with diffuse interstitial edema pattern and small effusions compatible with CHF.  Bilateral calcified pleural plaques  Original Report Authenticated By: Judie Petit. Ruel Favors, M.D.    ED ECG REPORT   Date: 01/14/2012   EKG Time: 3:50 PM  Rate: 94  Rhythm: normal sinus rhythm,  normal EKG, normal sinus rhythm, unchanged from previous tracings,   Axis: normal  Intervals:right bundle branch block, left anterior fascicular block and Bifascicluar block  ST&T Change: nonspecific ST and T wave changes  Narrative Interpretation: No significant change since 07/13/11    MDM   Spoke with Dr Sunnie Nielsen, [facility] who will see patient for admission. Discussed this with patient and family agree with plan. Discussed plan with Dr. Ethelda Chick. Will treat for CAP as well as CHF.   Dx CHF, CAP       Thomasene Lot, PA-C 01/14/12 1558

## 2012-01-14 NOTE — ED Notes (Signed)
Pt being transported to x-ray

## 2012-01-14 NOTE — ED Notes (Signed)
Pt complains of cough with white and green sputum and also sts weakness.

## 2012-01-14 NOTE — ED Notes (Signed)
Pt states his cough began Friday and has "gotten a little worse" since it began. On O2 3L at home. O2 Sat 94% on 3L now. Pt states that he is "jittery" and has a "dry mouth" and these are the primary reasons he is seeking assmt/tx today.

## 2012-01-14 NOTE — Progress Notes (Signed)
e  01/14/12 1607  Discharge Planning  Type of Residence Private residence  Living Arrangements Spouse/significant other  Home Care Services No  Support Systems Spouse/significant other  Do you have any problems obtaining your medications? No  Family/patient expects to be discharged to: Private residence  Once you are discharged, how will you get to your follow-up appointment? Self;Family  Expected Discharge Date 01/17/12  Case Management Consult Needed No  Social Work Consult Needed No     Consult  unit based LCSW if psychosocial or disposition needs are identified.  Dionne Milo MSW [facility] Emergency Dept. Weekend/Social Worker [phone]

## 2012-01-15 ENCOUNTER — Inpatient Hospital Stay (HOSPITAL_COMMUNITY): Payer: Medicare Other

## 2012-01-15 DIAGNOSIS — I5031 Acute diastolic (congestive) heart failure: Secondary | ICD-10-CM

## 2012-01-15 DIAGNOSIS — J449 Chronic obstructive pulmonary disease, unspecified: Secondary | ICD-10-CM

## 2012-01-15 DIAGNOSIS — I509 Heart failure, unspecified: Secondary | ICD-10-CM

## 2012-01-15 DIAGNOSIS — J9600 Acute respiratory failure, unspecified whether with hypoxia or hypercapnia: Secondary | ICD-10-CM

## 2012-01-15 DIAGNOSIS — J159 Unspecified bacterial pneumonia: Secondary | ICD-10-CM

## 2012-01-15 DIAGNOSIS — E119 Type 2 diabetes mellitus without complications: Secondary | ICD-10-CM

## 2012-01-15 DIAGNOSIS — I120 Hypertensive chronic kidney disease with stage 5 chronic kidney disease or end stage renal disease: Secondary | ICD-10-CM

## 2012-01-15 DIAGNOSIS — J9601 Acute respiratory failure with hypoxia: Secondary | ICD-10-CM

## 2012-01-15 LAB — COMPREHENSIVE METABOLIC PANEL
Albumin: 3.5 g/dL (ref 3.5–5.2)
Alkaline Phosphatase: 65 U/L (ref 39–117)
BUN: 44 mg/dL — ABNORMAL HIGH (ref 6–23)
Calcium: 9.8 mg/dL (ref 8.4–10.5)
Creatinine, Ser: 1.66 mg/dL — ABNORMAL HIGH (ref 0.50–1.35)
Potassium: 4.5 mEq/L (ref 3.5–5.1)
Total Protein: 7.6 g/dL (ref 6.0–8.3)

## 2012-01-15 LAB — EXPECTORATED SPUTUM ASSESSMENT W GRAM STAIN, RFLX TO RESP C

## 2012-01-15 LAB — BLOOD GAS, ARTERIAL
Acid-Base Excess: 15.2 mmol/L — ABNORMAL HIGH (ref 0.0–2.0)
Patient temperature: 98.5
TCO2: 52.2 mmol/L (ref 0–100)

## 2012-01-15 LAB — CBC
HCT: 35.2 % — ABNORMAL LOW (ref 39.0–52.0)
HCT: 35.7 % — ABNORMAL LOW (ref 39.0–52.0)
Hemoglobin: 9.9 g/dL — ABNORMAL LOW (ref 13.0–17.0)
Hemoglobin: 9.9 g/dL — ABNORMAL LOW (ref 13.0–17.0)
MCH: 28.3 pg (ref 26.0–34.0)
MCHC: 27.7 g/dL — ABNORMAL LOW (ref 30.0–36.0)
MCV: 101.7 fL — ABNORMAL HIGH (ref 78.0–100.0)
RBC: 3.46 MIL/uL — ABNORMAL LOW (ref 4.22–5.81)
RBC: 3.5 MIL/uL — ABNORMAL LOW (ref 4.22–5.81)
WBC: 4.3 10*3/uL (ref 4.0–10.5)

## 2012-01-15 LAB — PROCALCITONIN: Procalcitonin: 0.1 ng/mL

## 2012-01-15 LAB — GLUCOSE, CAPILLARY
Glucose-Capillary: 111 mg/dL — ABNORMAL HIGH (ref 70–99)
Glucose-Capillary: 308 mg/dL — ABNORMAL HIGH (ref 70–99)
Glucose-Capillary: 234 mg/dL — ABNORMAL HIGH (ref 70–99)

## 2012-01-15 LAB — PRO B NATRIURETIC PEPTIDE: Pro B Natriuretic peptide (BNP): 443.8 pg/mL — ABNORMAL HIGH (ref 0–125)

## 2012-01-15 LAB — CARDIAC PANEL(CRET KIN+CKTOT+MB+TROPI)
CK, MB: 1.8 ng/mL (ref 0.3–4.0)
Relative Index: INVALID (ref 0.0–2.5)
Troponin I: <0.3 ng/mL (ref <0.30)

## 2012-01-15 LAB — HEMOGLOBIN A1C
Hgb A1c MFr Bld: 5.6 % (ref <5.7)
Mean Plasma Glucose: 114 mg/dL (ref <117)

## 2012-01-15 LAB — APTT: aPTT: 31 seconds (ref 24–37)

## 2012-01-15 LAB — MRSA PCR SCREENING: MRSA by PCR: NEGATIVE

## 2012-01-15 MED ORDER — ALBUTEROL SULFATE (5 MG/ML) 0.5% IN NEBU
2.5000 mg | INHALATION_SOLUTION | Freq: Four times a day (QID) | RESPIRATORY_TRACT | Status: DC
  Administered 2012-01-16 (×2): 2.5 mg via RESPIRATORY_TRACT
  Filled 2012-01-15 (×2): qty 0.5

## 2012-01-15 MED ORDER — BUDESONIDE 0.25 MG/2ML IN SUSP
0.2500 mg | Freq: Two times a day (BID) | RESPIRATORY_TRACT | Status: DC
  Administered 2012-01-16 – 2012-01-20 (×10): 0.25 mg via RESPIRATORY_TRACT
  Filled 2012-01-15 (×13): qty 2

## 2012-01-15 MED ORDER — ARFORMOTEROL TARTRATE 15 MCG/2ML IN NEBU
15.0000 ug | INHALATION_SOLUTION | Freq: Two times a day (BID) | RESPIRATORY_TRACT | Status: DC
  Administered 2012-01-16: 15 ug via RESPIRATORY_TRACT
  Filled 2012-01-15 (×3): qty 2

## 2012-01-15 MED ORDER — ALBUTEROL SULFATE (5 MG/ML) 0.5% IN NEBU: 2.5000 mg | INHALATION_SOLUTION | Freq: Four times a day (QID) | RESPIRATORY_TRACT | Status: DC | PRN

## 2012-01-15 MED ORDER — IPRATROPIUM BROMIDE 0.02 % IN SOLN
0.5000 mg | Freq: Four times a day (QID) | RESPIRATORY_TRACT | Status: DC
  Administered 2012-01-16 (×2): 0.5 mg via RESPIRATORY_TRACT
  Filled 2012-01-15 (×2): qty 2.5

## 2012-01-15 MED ORDER — LEVOFLOXACIN IN D5W 750 MG/150ML IV SOLN
750.0000 mg | Freq: Every day | INTRAVENOUS | Status: DC
  Administered 2012-01-16: 750 mg via INTRAVENOUS
  Filled 2012-01-15 (×2): qty 150

## 2012-01-15 MED ORDER — METHYLPREDNISOLONE SODIUM SUCC 125 MG IJ SOLR
60.0000 mg | Freq: Once | INTRAMUSCULAR | Status: AC
  Administered 2012-01-16: 60 mg via INTRAVENOUS
  Filled 2012-01-15: qty 2

## 2012-01-15 MED ORDER — INSULIN ASPART 100 UNIT/ML ~~LOC~~ SOLN
3.0000 [IU] | SUBCUTANEOUS | Status: DC
  Administered 2012-01-16 – 2012-01-19 (×18): 3 [IU] via SUBCUTANEOUS

## 2012-01-15 MED ORDER — FUROSEMIDE 10 MG/ML IJ SOLN
100.0000 mg | Freq: Four times a day (QID) | INTRAMUSCULAR | Status: DC
  Administered 2012-01-16 (×2): 100 mg via INTRAVENOUS
  Filled 2012-01-15 (×4): qty 10

## 2012-01-15 NOTE — Progress Notes (Signed)
Patient could not tolerate current bipap settings so RT decreased ipap to 8cmH20 and epap to 4cmH20. Patient agreed to these settings and continued bipap. RT will continue to monitor.

## 2012-01-15 NOTE — Progress Notes (Signed)
UR Completed. Murice Barbar, RN, Nurse Case Manager [phone]     

## 2012-01-15 NOTE — Progress Notes (Signed)
Subjective: Patient breathing better, feeling better this morning. Still coughing up.  Objective: Filed Vitals:   01/14/12 2200 01/15/12 0132 01/15/12 0234 01/15/12 0445  BP:   132/71 145/67  Pulse: 100  89 83  Temp:   98.2 F (36.8 C) 98.1 F (36.7 C)  TempSrc:   Oral Oral  Resp: 20  20 20   Height:      Weight:    123 kg (271 lb 2.7 oz)  SpO2: 95% 97% 98% 98%   Weight change:    General: Alert, awake, oriented x3, in no acute distress.  HEENT: No bruits, no goiter.  Heart: Regular rate and rhythm, without murmurs, rubs, gallops.  Lungs: Crackles, bilateral, bilateral air movement.  Abdomen: Soft, nontender, nondistended, positive bowel sounds.  Neuro: Grossly intact, nonfocal. Extremities: trace edema.   Lab Results:  [facility] 01/15/12 0605 01/14/12 1405  NA 146* 146*  K 4.5 4.9  CL 95* 98  CO2 >45* >45*  GLUCOSE 109* 125*  BUN 44* 47*  CREATININE 1.66* 1.73*  CALCIUM 9.8 9.8  MG -- --  PHOS -- --    Basename 01/15/12 0605  AST 14  ALT 8  ALKPHOS 65  BILITOT 0.2*  PROT 7.6  ALBUMIN 3.5    Basename 01/15/12 0605 01/14/12 1405  WBC 4.3 5.9  NEUTROABS -- 3.9  HGB 9.9* 9.6*  HCT 35.2* 33.9*  MCV 101.7* 100.0  PLT 129* 121*    Basename 01/15/12 0027 01/14/12 1645  CKTOTAL 34 36  CKMB 1.8 1.9  CKMBINDEX -- --  TROPONINI <0.30 <0.30    Basename 01/14/12 1649  HGBA1C 5.6    Micro Results: No results found for this or any previous visit (from the past 240 hour(s)).  Studies/Results: Dg Chest 2 View  01/14/2012  *RADIOLOGY REPORT*  Clinical Data: Cough, congestion, shortness of breath, history of CHF  CHEST - 2 VIEW  Comparison: 07/16/2011  Findings: Prior coronary bypass changes noted.  Heart is enlarged with diffuse airspace disease/edema with small effusions.  CHF is favored.  Inferior calcified pleural plaques noted bilaterally.  No pneumothorax.  Trachea midline.  IMPRESSION: Cardiomegaly with diffuse interstitial edema pattern and small  effusions compatible with CHF.  Bilateral calcified pleural plaques  Original Report Authenticated By: Judie Petit. Ruel Favors, M.D.    Medications: I have reviewed the patient's current medications.  1- Acute on Chronic Respiratory failure: Patient presents with SOB, productive cough. No leukocytosis, no significant increase BNP. Respiratory failure multifactorial component PNA, mild acute diastolic HF exacerbation. Patient received 1 dose IV lasix. Patient relates improvement of dyspnea. Urine out put 1.6 L.  I will continue with Avelox.  Will follow sputum culture rsult.   will resume prior home dose of lasix 80 mg BID. Will monitor renal function and adjust lasix as needed.Marland KitchenBIPAP QHS.   2-Acute diastolic hear failure, mild exacerbation, Chest x ray with pulmonary edema, patient relates increase fluids retention. Received 1 dose of IV lasix. Resume prior home dose of lasix tomorrow. Cycle cardiac enzymes times 2 negative. 3-CKD stage 3: Per record last Cr at 1.7- 1.8. Monitor renal function on increase lasix dose. Cr slightly decrease today.  4-COPD: Continue with nebulizer treatments and ipratropium, advair.  5-Diabetes: I will discontinue Metformin, contraindicated in renal failure. I will check HB A1c SSI. Will consider other oral options or insulin.  6-PNA, Community acquired: Will treat for presume PNA. Avelox, Sputum culture. Day 2 antibiotics. 7-Thrombocytopenia; mild, chronic.  8-Anemia Iron deficiency, anemia of chronic diseases  Yared Susan,  MD  [facility]  Pager [phone]     LOS: 1 day     [facility] 01/15/2012, 8:23 AM

## 2012-01-15 NOTE — Progress Notes (Signed)
eLink Physician-Brief Progress Note Patient Name: NILO FALLIN DOB: Dec 25, 1950 MRN: [id number]  Date of Service  01/15/2012   HPI/Events of Note   Pt tfr to floow in respiratory failure and unresponsive at this time.   Orders Transfer to PCCM, see orders.   Intervention Category Major Interventions: Acid-Base disturbance - evaluation and management;Respiratory failure - evaluation and management  Shan Levans 01/15/2012, 7:39 PM

## 2012-01-15 NOTE — Consult Note (Signed)
Name: [name] [name] MRN: [id number] DOB: [birth year]    LOS: 1  Referring Provider:  Carmell Austria Reason for Referral:  Hypercapnic respiratory failure  PULMONARY / CRITICAL CARE MEDICINE  HPI:  This is a 61 y/o male with COPD, Pulmonary Hypertension, Obesity Hypoventilation syndrome, Diastolic heart failure and CKD who was admitted on 01/14/12 with shortness of breath, cough productive of green sputum for three days.  He was admitted to [facility] with a plan to gently diurese and treat possible pneumonia with avelox.  He became more lethargic during 6/3 and an ABG noted hypercapnic respiratory failure.  PCCM was consulted for further management.  His wife notes that he has not been able to use his BIPAP machine prior to admission because of shortness of breath.  Past Medical History  Diagnosis Date  . Heart disease, unspecified   . Coronary atherosclerosis of unspecified type of vessel, native or graft   . Chronic pulmonary heart disease, unspecified   . Other hyperalimentation   . Chronic respiratory failure   . Unspecified sleep apnea   . Chronic airway obstruction, not elsewhere classified   . Angina   . Shortness of breath   . Anemia   . Chronic kidney disease   . Anxiety   . Hypertension   . CHF (congestive heart failure)   . Blood transfusion     no reaction from transfusion  . Arthritis     hx of gout  . Diabetes mellitus   . CORONARY ARTERY DISEASE 10/28/2007  . CPAP (continuous positive airway pressure) dependence   . GERD (gastroesophageal reflux disease)    Past Surgical History  Procedure Date  . Coronary artery bypass graft   . Appendectomy   . Bilateral vats ablation   . Coronary angioplasty with stent placement   . Hemmorhoids   . Lung sx 2005    Growth on outside of right lung  . Esophagogastroduodenoscopy 08/11/2011    Procedure: ESOPHAGOGASTRODUODENOSCOPY (EGD);  Surgeon: Barrie Folk, MD;  Location: [facility] ENDOSCOPY;  Service: Endoscopy;  Laterality: N/A;  .  Colonoscopy 08/11/2011    Procedure: COLONOSCOPY;  Surgeon: Barrie Folk, MD;  Location: [facility] ENDOSCOPY;  Service: Endoscopy;  Laterality: N/A;   Prior to Admission medications   Medication Sig Start Date End Date Taking? Authorizing Provider  albuterol (PROVENTIL HFA;VENTOLIN HFA) 108 (90 BASE) MCG/ACT inhaler Inhale 2 puffs into the lungs every 6 (six) hours as needed. For wheezing   Yes Historical Provider, MD  aspirin EC 81 MG tablet Take 81 mg by mouth daily.   Yes Historical Provider, MD  clopidogrel (PLAVIX) 75 MG tablet Take 75 mg by mouth daily.   Yes Historical Provider, MD  COLCRYS 0.6 MG tablet Take 1 tablet by mouth daily. 01/02/11  Yes Historical Provider, MD  diazepam (VALIUM) 5 MG tablet Take 5 mg by mouth at bedtime as needed. For anxiety/sleep   Yes Historical Provider, MD  Fluticasone-Salmeterol (ADVAIR) 250-50 MCG/DOSE AEPB Inhale 1 puff into the lungs every 12 (twelve) hours as needed. For wheezing   Yes Historical Provider, MD  furosemide (LASIX) 80 MG tablet Take 80 mg by mouth 2 (two) times daily.  07/22/11  Yes Abelino Derrick, PA  isosorbide mononitrate (IMDUR) 30 MG 24 hr tablet Take 45 mg by mouth daily.    Yes Historical Provider, MD  metFORMIN (GLUCOPHAGE-XR) 750 MG 24 hr tablet Take 750 mg by mouth 2 (two) times daily.    Yes Historical Provider, MD  metoprolol tartrate (LOPRESSOR)  25 MG tablet Take 12.5 mg by mouth 2 (two) times daily. 07/22/11  Yes Abelino Derrick, PA  Multiple Vitamin (MULITIVITAMIN WITH MINERALS) TABS Take 1 tablet by mouth daily.   Yes Historical Provider, MD  nitroGLYCERIN (NITROSTAT) 0.4 MG SL tablet Place 0.4 mg under the tongue every 5 (five) minutes as needed. For chest pain   Yes Historical Provider, MD  pantoprazole (PROTONIX) 40 MG tablet Take 1 tablet by mouth daily. 12/18/10  Yes Historical Provider, MD  potassium chloride SA (K-DUR,KLOR-CON) 20 MEQ tablet Take 20 mEq by mouth 2 (two) times daily.    Yes Historical Provider, MD  rosuvastatin  (CRESTOR) 20 MG tablet Take 20 mg by mouth daily.    Yes Historical Provider, MD  Tamsulosin HCl (FLOMAX) 0.4 MG CAPS Take 1 tablet by mouth daily. 12/16/10  Yes Historical Provider, MD  tiotropium (SPIRIVA) 18 MCG inhalation capsule Place 18 mcg into inhaler and inhale daily as needed. For wheezing 07/22/11 07/21/12 Yes Abelino Derrick, PA   Allergies Allergies  Allergen Reactions  . Amlodipine Besy-Benazepril Hcl Swelling    Lips swell  . Percocet (Oxycodone-Acetaminophen) Other (See Comments)    hallucinations    Family History Family History  Problem Relation Age of Onset  . Diabetes Sister    Social History  reports that he quit smoking about 13 years ago. He has never used smokeless tobacco. He reports that he drinks alcohol. He reports that he does not use illicit drugs.  Review Of Systems:  Cannot obtain due to altered mental status  Brief patient description:  61 y/o male with COPD, PH, OHVS, CHF who was admitted on 6/2 with respiratory failure and later developed hypercapnic respiratory failure on 6/3.  He was placed on BIPAP.  Events Since Admission:   Current Status:  Vital Signs: Temp:  [97.9 F (36.6 C)-98.5 F (36.9 C)] 98.2 F (36.8 C) (06/03 1835) Pulse Rate:  [73-104] 75  (06/03 1849) Resp:  [18-38] 38  (06/03 1849) BP: (99-145)/(52-72) 122/72 mmHg (06/03 1849) SpO2:  [91 %-100 %] 91 % (06/03 1849) FiO2 (%):  [30 %] 30 % (06/03 1849) Weight:  [123 kg (271 lb 2.7 oz)] 123 kg (271 lb 2.7 oz) (06/03 0445)  Physical Examination: Gen: chronically ill appearing, arouses with sternal rub HEENT: NCAT, PERRL, BIPAP in place PULM: Insp rales in bases bilaterally, no wheezing CV: RRR, no mgr, difficult to assess JVD AB: BS+, soft, nontender, no hsm Ext: warm, pitting edema noted, no clubbing, no cyanosis Derm: no rash or skin breakdown Neuro: Somnolent but arouses, follows simple commands  Active Problems:  CORONARY ARTERY DISEASE, CABG 2001, last PCI 7/11   DIASTOLIC DYSFUNCTION  C O P D  RESPIRATORY FAILURE, CHRONIC, on BiPap  Diabetes mellitus  Acute respiratory failure with hypoxia   ASSESSMENT AND PLAN  PULMONARY  Lab 01/15/12 1827  PHART 7.018*  PCO2ART 189.0*  PO2ART 99.9  HCO3 46.4*  O2SAT 97.3   Repeat ABG at 1930 on 6/3 could not be uploaded into EPIC, pH 7.19, pCO2 130  Ventilator Settings: Vent Mode:  [-]  FiO2 (%):  [30 %] 30 % CXR:  Bilateral heterogeneous opacities consistent with pulmonary edema ETT:  n/a  A:  Acute on chronic respiratory failure, due to volume overload in setting of cor pulmonale and obesity hypoventilation syndrome; improving ABG and mental status after one hour of bipap;  P:   -Titrate up BIPAP to 18/8 -sit up in bed -continue diuresis with IV lasix  q6 hours x 4 doses -will need intubation if mental status declines or cannot protect airway  A: COPD, perhaps mild exacerbation/bronchitis with sputum production at home P: -d/c avelox -start levaquin (improved pseudomonas coverage) -sputum culture -scheduled a/a nebs -replace Advair with brovana and pulmicort while on BIPAP -one dose of solumedrol IV 6/3, consider scheduling this on 6/4 if wheezing  CARDIOVASCULAR  Lab 01/15/12 0901 01/15/12 0027 01/14/12 1645 01/14/12 1405  TROPONINI <0.30 <0.30 <0.30 --  LATICACIDVEN -- -- -- --  PROBNP -- -- -- 358.6*   ECG:  NSR, RBBB, likely RVH Lines:  A: Acute volume overload in setting of known diastolic dysfunction and pulmonary hypertension; has had increase swelling, weight gain, and CXR consistent with pulmonary edema; diuretic dose had recently been decreased by nephrologist as outpatient (per admission H&P) P:  -diurese with IV lasix x4 doses q6 hours starting 6/3 PM -continue IMDUR, low dose metop -tele -address home lasix dosing prior to discharge  A: PVD, no active ischaemia P: -continue plavix  RENAL  Lab 01/15/12 0605 01/14/12 1405  NA 146* 146*  K 4.5 4.9  CL 95* 98    CO2 >45* >45*  BUN 44* 47*  CREATININE 1.66* 1.73*  CALCIUM 9.8 9.8  MG -- --  PHOS -- --   Intake/Output      06/03 0701 - 06/04 0700   P.O. 340   Total Intake(mL/kg) 340 (2.8)   Urine (mL/kg/hr) 650 (0.4)   Total Output 650   Net -310        Foley:  6/3  A:  Acute on chronic renal failure improved with diuresis P: -volume overloaded, continue diuresis -foley -renal dose meds -monitor bmet daily  GASTROINTESTINAL  Lab 01/15/12 0605  AST 14  ALT 8  ALKPHOS 65  BILITOT 0.2*  PROT 7.6  ALBUMIN 3.5    A:  GERD, No acute issues P:   -home ppi  HEMATOLOGIC  Lab 01/15/12 0605 01/14/12 1405  HGB 9.9* 9.6*  HCT 35.2* 33.9*  PLT 129* 121*  INR -- --  APTT 31 --   A:  No acute issues P:  -monitor cbc  INFECTIOUS  Lab 01/15/12 0605 01/14/12 1405  WBC 4.3 5.9  PROCALCITON -- --   Cultures: 6/3 sputum >>  Antibiotics: 6/2 moxi >> 6/3 6/3 Levaquin >>  A:  Acute tracheobronchitis in setting of COPD, no clear pneumonia (no fever, elevated WBC) P:   -f/u culture -change moxi to Levaquin for better PSA coverage  ENDOCRINE  Lab 01/15/12 1823 01/15/12 1653 01/15/12 1148 01/15/12 0623 01/14/12 2109  GLUCAP 365* 308* 142* 111* 266*   A:  DM2   P:   -SSI -add regular q4 hour insulin while npo on bipap -will likely need lantus when taking po -hold metformin (bad idea in setting of chronic hypoxemia and ckd)  NEUROLOGIC  A:  Acute encephalopathy due to hypercarbia, improving P:   -continue bipap -frequent orientation  BEST PRACTICE / DISPOSITION Level of Care:  ICU Primary Service:  PCCM Consultants:   Code Status:  His wife states that he is full code, but that he "always says that he doesn't want the breathing tube down his throat".  Will use bipap tonight unless he is not protecting his airway or his mental status declines.  For now he is improving but will need to have [place] address code status with him after he recovers. Diet:  npo DVT Px:   scd GI Px:  ppi Skin Integrity:  Normal per rn Social / Family:  Updated by phone  Total CC time 60 minutes.  Max Fickle, M.D. Pulmonary and Critical Care Medicine [facility] Pager: [phone]  01/15/2012, 8:24 PM

## 2012-01-15 NOTE — Progress Notes (Signed)
I was called by nurse because patient is  lethargic, but arousable. Order stat ABG which show Ph 7.01 PCO 189,  PO2 99. Patient started on BIPAP, now more awake, protecting airway. Will transfer to ICU, Repeat ABG in 30-45 minutes, CCM consulted. Repeat Chest x ray.

## 2012-01-16 ENCOUNTER — Inpatient Hospital Stay (HOSPITAL_COMMUNITY): Payer: Medicare Other

## 2012-01-16 DIAGNOSIS — J189 Pneumonia, unspecified organism: Secondary | ICD-10-CM

## 2012-01-16 DIAGNOSIS — E873 Alkalosis: Secondary | ICD-10-CM

## 2012-01-16 DIAGNOSIS — T783XXA Angioneurotic edema, initial encounter: Secondary | ICD-10-CM

## 2012-01-16 DIAGNOSIS — E872 Acidosis: Secondary | ICD-10-CM

## 2012-01-16 DIAGNOSIS — G9340 Encephalopathy, unspecified: Secondary | ICD-10-CM | POA: Diagnosis present

## 2012-01-16 DIAGNOSIS — J9600 Acute respiratory failure, unspecified whether with hypoxia or hypercapnia: Secondary | ICD-10-CM

## 2012-01-16 LAB — GLUCOSE, CAPILLARY
Glucose-Capillary: 114 mg/dL — ABNORMAL HIGH (ref 70–99)
Glucose-Capillary: 97 mg/dL (ref 70–99)

## 2012-01-16 LAB — BASIC METABOLIC PANEL
BUN: 51 mg/dL — ABNORMAL HIGH (ref 6–23)
BUN: 51 mg/dL — ABNORMAL HIGH (ref 6–23)
CO2: 43 mEq/L (ref 19–32)
Calcium: 9.5 mg/dL (ref 8.4–10.5)
Calcium: 9.6 mg/dL (ref 8.4–10.5)
Creatinine, Ser: 1.83 mg/dL — ABNORMAL HIGH (ref 0.50–1.35)
GFR calc Af Amer: 45 mL/min — ABNORMAL LOW (ref >90)
GFR calc non Af Amer: 35 mL/min — ABNORMAL LOW (ref >90)
GFR calc non Af Amer: 39 mL/min — ABNORMAL LOW (ref >90)
Glucose, Bld: 347 mg/dL — ABNORMAL HIGH (ref 70–99)
Potassium: 5.2 mEq/L — ABNORMAL HIGH (ref 3.5–5.1)

## 2012-01-16 LAB — POCT I-STAT 3, ART BLOOD GAS (G3+)
Acid-Base Excess: 22 mmol/L — ABNORMAL HIGH (ref 0.0–2.0)
Acid-Base Excess: 21 mmol/L — ABNORMAL HIGH (ref 0.0–2.0)
Bicarbonate: 49.6 mEq/L — ABNORMAL HIGH (ref 20.0–24.0)
Bicarbonate: 50.9 mEq/L — ABNORMAL HIGH (ref 20.0–24.0)
Bicarbonate: 51.8 mEq/L — ABNORMAL HIGH (ref 20.0–24.0)
O2 Saturation: 98 %
O2 Saturation: 92 %
Patient temperature: 96.6
TCO2: >50 mmol/L (ref 0–100)
TCO2: >50 mmol/L (ref 0–100)
TCO2: >50 mmol/L (ref 0–100)
pCO2 arterial: 83.5 mmHg (ref 35.0–45.0)
pCO2 arterial: 82 mmHg (ref 35.0–45.0)
pH, Arterial: 7.385 (ref 7.350–7.450)
pH, Arterial: 7.409 (ref 7.350–7.450)
pO2, Arterial: 121 mmHg — ABNORMAL HIGH (ref 80.0–100.0)
pO2, Arterial: 63 mmHg — ABNORMAL LOW (ref 80.0–100.0)

## 2012-01-16 LAB — CBC
HCT: 34.8 % — ABNORMAL LOW (ref 39.0–52.0)
MCH: 28.1 pg (ref 26.0–34.0)
MCHC: 28.2 g/dL — ABNORMAL LOW (ref 30.0–36.0)
MCV: 99.7 fL (ref 78.0–100.0)
RDW: 12.8 % (ref 11.5–15.5)

## 2012-01-16 MED ORDER — ETOMIDATE 2 MG/ML IV SOLN
INTRAVENOUS | Status: AC
  Administered 2012-01-16: 17:00:00
  Filled 2012-01-16: qty 10

## 2012-01-16 MED ORDER — HALOPERIDOL LACTATE 5 MG/ML IJ SOLN
2.0000 mg | Freq: Once | INTRAMUSCULAR | Status: AC
  Administered 2012-01-16: 2 mg via INTRAVENOUS

## 2012-01-16 MED ORDER — ALBUTEROL SULFATE (5 MG/ML) 0.5% IN NEBU
2.5000 mg | INHALATION_SOLUTION | RESPIRATORY_TRACT | Status: DC
  Administered 2012-01-16 – 2012-01-20 (×24): 2.5 mg via RESPIRATORY_TRACT
  Filled 2012-01-16 (×24): qty 0.5

## 2012-01-16 MED ORDER — FENTANYL CITRATE 0.05 MG/ML IJ SOLN: 50.0000 ug | INTRAMUSCULAR | Status: DC | PRN

## 2012-01-16 MED ORDER — BIOTENE DRY MOUTH MT LIQD
15.0000 mL | Freq: Two times a day (BID) | OROMUCOSAL | Status: DC
  Administered 2012-01-16 – 2012-01-19 (×8): 15 mL via OROMUCOSAL

## 2012-01-16 MED ORDER — SODIUM CHLORIDE 0.9 % IV SOLN
0.2000 ug/kg/h | INTRAVENOUS | Status: DC
  Administered 2012-01-16: 0.2 ug/kg/h via INTRAVENOUS
  Filled 2012-01-16 (×2): qty 2

## 2012-01-16 MED ORDER — PIPERACILLIN-TAZOBACTAM 3.375 G IVPB
3.3750 g | Freq: Three times a day (TID) | INTRAVENOUS | Status: DC
  Administered 2012-01-16 – 2012-01-18 (×6): 3.375 g via INTRAVENOUS
  Filled 2012-01-16 (×7): qty 50

## 2012-01-16 MED ORDER — MIDAZOLAM BOLUS VIA INFUSION
1.0000 mg | INTRAVENOUS | Status: DC | PRN
  Filled 2012-01-16: qty 2

## 2012-01-16 MED ORDER — CHLORHEXIDINE GLUCONATE 0.12 % MT SOLN
15.0000 mL | Freq: Two times a day (BID) | OROMUCOSAL | Status: DC
  Administered 2012-01-16 – 2012-01-19 (×8): 15 mL via OROMUCOSAL
  Filled 2012-01-16 (×8): qty 15

## 2012-01-16 MED ORDER — HALOPERIDOL LACTATE 5 MG/ML IJ SOLN
INTRAMUSCULAR | Status: AC
  Administered 2012-01-16: 2 mg via INTRAVENOUS
  Filled 2012-01-16: qty 1

## 2012-01-16 MED ORDER — METOPROLOL TARTRATE 1 MG/ML IV SOLN
5.0000 mg | Freq: Four times a day (QID) | INTRAVENOUS | Status: DC
  Administered 2012-01-16 – 2012-01-18 (×5): 5 mg via INTRAVENOUS
  Filled 2012-01-16 (×21): qty 5

## 2012-01-16 MED ORDER — VECURONIUM BROMIDE 10 MG IV SOLR
INTRAVENOUS | Status: AC
  Administered 2012-01-16: 17:00:00
  Filled 2012-01-16: qty 10

## 2012-01-16 MED ORDER — LORAZEPAM 2 MG/ML IJ SOLN
0.5000 mg | INTRAMUSCULAR | Status: DC | PRN
  Administered 2012-01-16: 0.5 mg via INTRAVENOUS
  Filled 2012-01-16: qty 1

## 2012-01-16 MED ORDER — MIDAZOLAM HCL 2 MG/2ML IJ SOLN
INTRAMUSCULAR | Status: AC
  Administered 2012-01-16: 16:00:00
  Filled 2012-01-16: qty 4

## 2012-01-16 MED ORDER — FENTANYL CITRATE 0.05 MG/ML IJ SOLN
INTRAMUSCULAR | Status: AC
  Administered 2012-01-16: 100 ug
  Filled 2012-01-16: qty 2

## 2012-01-16 MED ORDER — VANCOMYCIN HCL 1000 MG IV SOLR
2500.0000 mg | Freq: Once | INTRAVENOUS | Status: AC
  Administered 2012-01-16: 2500 mg via INTRAVENOUS
  Filled 2012-01-16: qty 2500

## 2012-01-16 MED ORDER — METHYLPREDNISOLONE SODIUM SUCC 125 MG IJ SOLR
80.0000 mg | Freq: Three times a day (TID) | INTRAMUSCULAR | Status: DC
  Administered 2012-01-16 – 2012-01-18 (×6): 80 mg via INTRAVENOUS
  Filled 2012-01-16: qty 2
  Filled 2012-01-16 (×2): qty 1.28
  Filled 2012-01-16: qty 2
  Filled 2012-01-16 (×5): qty 1.28

## 2012-01-16 MED ORDER — FENTANYL BOLUS VIA INFUSION
50.0000 ug | Freq: Four times a day (QID) | INTRAVENOUS | Status: DC | PRN
  Filled 2012-01-16: qty 100

## 2012-01-16 MED ORDER — FUROSEMIDE 10 MG/ML IJ SOLN
40.0000 mg | Freq: Three times a day (TID) | INTRAMUSCULAR | Status: DC
  Administered 2012-01-16: 40 mg via INTRAVENOUS
  Filled 2012-01-16 (×3): qty 4

## 2012-01-16 MED ORDER — ACETAZOLAMIDE SODIUM 500 MG IJ SOLR
250.0000 mg | Freq: Four times a day (QID) | INTRAMUSCULAR | Status: AC
  Administered 2012-01-16 – 2012-01-17 (×4): 250 mg via INTRAVENOUS
  Filled 2012-01-16 (×4): qty 500

## 2012-01-16 MED ORDER — VANCOMYCIN HCL 1000 MG IV SOLR
1250.0000 mg | Freq: Two times a day (BID) | INTRAVENOUS | Status: DC
  Administered 2012-01-17 – 2012-01-18 (×3): 1250 mg via INTRAVENOUS
  Filled 2012-01-16 (×4): qty 1250

## 2012-01-16 MED ORDER — IPRATROPIUM BROMIDE 0.02 % IN SOLN
0.5000 mg | RESPIRATORY_TRACT | Status: DC
  Administered 2012-01-16 – 2012-01-20 (×24): 0.5 mg via RESPIRATORY_TRACT
  Filled 2012-01-16 (×24): qty 2.5

## 2012-01-16 MED ORDER — MIDAZOLAM HCL 2 MG/2ML IJ SOLN: 2.0000 mg | INTRAMUSCULAR | Status: DC | PRN

## 2012-01-16 MED ORDER — PANTOPRAZOLE SODIUM 40 MG IV SOLR
40.0000 mg | INTRAVENOUS | Status: DC
  Administered 2012-01-16 – 2012-01-18 (×3): 40 mg via INTRAVENOUS
  Filled 2012-01-16 (×3): qty 40

## 2012-01-16 MED ORDER — SODIUM CHLORIDE 0.9 % IV SOLN
50.0000 ug/h | INTRAVENOUS | Status: DC
  Administered 2012-01-16: 100 ug/h via INTRAVENOUS
  Administered 2012-01-17: 150 ug/h via INTRAVENOUS
  Administered 2012-01-17: 200 ug/h via INTRAVENOUS
  Filled 2012-01-16 (×3): qty 50

## 2012-01-16 MED ORDER — SODIUM CHLORIDE 0.9 % IV SOLN
2.0000 mg/h | INTRAVENOUS | Status: DC
  Administered 2012-01-16 – 2012-01-17 (×2): 3 mg/h via INTRAVENOUS
  Administered 2012-01-17 (×2): 5 mg/h via INTRAVENOUS
  Filled 2012-01-16 (×7): qty 10

## 2012-01-16 NOTE — Progress Notes (Signed)
Pt was fighting with nurse at the time of tx and vent check...alarms were sounding because he was combative.  rn givng versed to calm pt.

## 2012-01-16 NOTE — Progress Notes (Signed)
Pt still on 100% FiO2 after extubation- MD Delford Field called to make aware- orders received to wean FiO2 per protocol  [name] [name]

## 2012-01-16 NOTE — Procedures (Signed)
Arterial Catheter Insertion Procedure Note [name] [name] [id number] [birth year]  Procedure: Insertion of Arterial Catheter  Indications: Blood pressure monitoring  Procedure Details Consent: Risks of procedure as well as the alternatives and risks of each were explained to the (patient/caregiver).  Consent for procedure obtained. Time Out: Verified patient identification, verified procedure, site/side was marked, verified correct patient position, special equipment/implants available, medications/allergies/relevent history reviewed, required imaging and test results available.  Performed  Maximum sterile technique was used including antiseptics, cap, gloves, gown, hand hygiene, mask and sheet. Skin prep: Chlorhexidine; local anesthetic administered 20 gauge catheter was inserted into right radial artery using the Seldinger technique.  Evaluation Blood flow good; BP tracing good. Complications: No apparent complications.   Koren Bound 01/16/2012

## 2012-01-16 NOTE — Significant Event (Signed)
Pt noted to be more agitated and increased RR.  Hemodynamics/oxygenation okay and pulling Vt of 350 to 450 with NIPPV.  Will give haldol IV x one and check ABG.  Coralyn Helling, MD 01/16/2012, 3:40 AM

## 2012-01-16 NOTE — Procedures (Signed)
Intubation Procedure Note LC JOYNT [id number] 31-Jul-1951  Procedure: Intubation Indications: Respiratory insufficiency  Procedure Details Consent: Risks of procedure as well as the alternatives and risks of each were explained to the (patient/caregiver).  Consent for procedure obtained. Time Out: Verified patient identification, verified procedure, site/side was marked, verified correct patient position, special equipment/implants available, medications/allergies/relevent history reviewed, required imaging and test results available.  Performed  Maximum sterile technique was used including antiseptics, cap, gloves, hand hygiene and mask.  MAC    Evaluation Hemodynamic Status: BP stable throughout; O2 sats: stable throughout Patient's Current Condition: stable Complications: No apparent complications Patient did tolerate procedure well. Chest X-ray ordered to verify placement.  CXR: pending.   Koren Bound 01/16/2012

## 2012-01-16 NOTE — Progress Notes (Signed)
Pt began to desat after nurse gave meds, turned o2 up to 100% for a time then back down to 50%, pt stable at this time

## 2012-01-16 NOTE — Procedures (Signed)
Central Venous Catheter Insertion Procedure Note MODESTO GANOE [id number] 1951/03/02  Procedure: Insertion of Central Venous Catheter Indications: Assessment of intravascular volume  Procedure Details Consent: Unable to obtain consent because of altered level of consciousness. Time Out: Verified patient identification, verified procedure, site/side was marked, verified correct patient position, special equipment/implants available, medications/allergies/relevent history reviewed, required imaging and test results available.  Performed  Maximum sterile technique was used including antiseptics, cap, gloves, gown, hand hygiene, mask and sheet. Skin prep: Chlorhexidine; local anesthetic administered A antimicrobial bonded/coated triple lumen catheter was placed in the right internal jugular vein using the Seldinger technique.  Evaluation Blood flow good Complications: No apparent complications Patient did tolerate procedure well. Chest X-ray ordered to verify placement.  CXR: pending.  U/S used in placement, picture in chart.  Luwana Butrick 01/16/2012, 4:52 PM

## 2012-01-16 NOTE — Significant Event (Signed)
Rapid Response Event Note  Overview: Time Called: 1825 Arrival Time: 1845 Event Type: Respiratory  Initial Focused Assessment/Interventions: Called to patient's bedside because patient with progressive lethargy this afternoon.  RT at bedside to draw an ABG.  Pt sitting in chair, responsive, but lethargic. ABG results pH 7.02  pCo2  189  PO2  99   Bicarb  46 2 person assist back to bed.  Placed on Bipap. Dr Juanito Doom at bedside to assess patient. Transferred to 2309 via bed with heart monitor, and Bipap.     Event Summary: Name of Physician Notified: Dr Julien Girt at 1815  Name of Consulting Physician Notified: CCM at 1845  Outcome: Transferred (Comment) (2309)  Event End Time: 1930  [name] [name]

## 2012-01-16 NOTE — Progress Notes (Signed)
[facility] PCCM Brief Progress Note  I was called to the bedside by [facility] and nursing to re-evaluate Mr. Milanes.  He initially improved with BIPAP (comfortable, following commands) but then required Haldol for anxiety and agitation.  This made him markedly somnolent and his pCO2 climbed again.  RT increased his driving pressure on BIPAP and this made him even more anxious, tachycardic, tachypnic likely due to air trapping.  I decreased his BIPAP pressure again and checked a CXR to ensure that there is no worsening infiltrated.  He seems to be doing well on this pressure and is much more comfortable and his vital signs have normalized.    Would avoid haldol.  Would use ativan as needed for anxiety.  Repeat ABG if mental status not improved later today.  For now would hold off on intubation and continue with BIPAP and diuresis.  Yolonda Kida PCCM Pager: [phone] Cell: [phone] If no response, call [phone]

## 2012-01-16 NOTE — Progress Notes (Signed)
Called by Elink, patient's respiratory status is deteriorating.  He has diuresed and Bicarb has been rising.  Will place on diamox x3 doses while holding lasix.  Hope here is that as the bicarb returns to baseline (40's) then CO2 will not an issue (we can decrease that via mechanical ventilation) and patient will become more arousable and mental status will improve.  Ok to change levofloxacin to zosyn/vancomycin (patient has horrible dentition and CXR smelled poor).  Will follow frequent ABG's to make sure that both metabolic and respiratory status improves at the same time and we maintain acid/base.  Additional CC time of 45 min.  Alyson Reedy, M.D. [facility] Pulmonary/Critical Care Medicine. Pager: [phone]. After hours pager: [phone].

## 2012-01-16 NOTE — Care Management Note (Unsigned)
    Page 1 of 1   01/16/2012     3:13:50 PM   CARE MANAGEMENT NOTE 01/16/2012  Patient:  [name] [name], [name] [name]   Account Number:  [id number]  Date Initiated:  01/16/2012  Documentation initiated by:  Lynden Flemmer  Subjective/Objective Assessment:   PT ADM WITH ACUTE RESPIRATORY FAILURE, CURRENTLY ON BIPAP. PTA, PT INDEPENDENT, LIVES WITH SPOUSE OF 20 YEARS.     Action/Plan:   PT CURRENTLY ON BIPAP, NO FAMILY AVAILABLE.  WILL CONT TO FOLLOW FOR DC PLANNING.  WILL ATTEMPT TO MAKE CONTACT WITH FAMILY ASAP.   Anticipated DC Date:  01/21/2012   Anticipated DC Plan:  HOME W HOME HEALTH SERVICES      DC Planning Services  CM consult      Choice offered to / List presented to:             Status of service:  In process, will continue to follow Medicare Important Message given?   (If response is "NO", the following Medicare IM given date fields will be blank) Date Medicare IM given:   Date Additional Medicare IM given:    Discharge Disposition:    Per UR Regulation:    If discussed at Long Length of Stay Meetings, dates discussed:    Comments:

## 2012-01-16 NOTE — Consult Note (Addendum)
Name: [name] [name] MRN: [id number] DOB: [birth year]    LOS: 2  Referring Provider:  Carmell Austria Reason for Referral:  Hypercapnic respiratory failure  LOS 2 Date of admit: 01/14/2012 12:37 PM\  PULMONARY / CRITICAL CARE MEDICINE  HPI:  This is a 61 y/o male with COPD, Pulmonary Hypertension, Obesity Hypoventilation syndrome, Diastolic heart failure, CAD s/p CABG, GERD and CKD who was admitted on 01/14/12 with shortness of breath, cough productive of green sputum for three days.  He was admitted to [facility] with a plan to gently diurese and treat possible pneumonia with avelox.  He became more lethargic during 6/3 and an ABG noted hypercapnic respiratory failure.  PCCM was consulted  01/15/12 for further management.  His wife notes that he has not been able to use his BIPAP machine prior to admission because of shortness of breath.    Brief patient description:  61 y/o male with COPD, PH, OHVS, CHF who was admitted on 6/2 with respiratory failure and later developed hypercapnic respiratory failure on 6/3.  He was placed on BIPAP.  Events Since Admission: 6/4 - agitated and did not tolerate increased bipap settings-> ativan given and bipap  Settings lowered -> abg imprved PCO2 100-> 80s   SUBJECTIVE/OVERNIGHT/INTERVAL HX  6/4 - agitated and did not tolerate increased bipap settings-> ativan given and bipap  Settings lowered -> abg imprved PCO2 100-> 80s  Wife and sister at bedside   Vital Signs: Temp:  [97.7 F (36.5 C)-99.2 F (37.3 C)] 99.2 F (37.3 C) (06/04 0814) Pulse Rate:  [68-119] 107  (06/04 0909) Resp:  [15-38] 24  (06/04 0909) BP: (96-141)/(52-79) 141/72 mmHg (06/04 0909) SpO2:  [91 %-100 %] 100 % (06/04 0909) FiO2 (%):  [30 %-100 %] 40 % (06/04 0909) Weight:  [128.9 kg (284 lb 2.8 oz)] 128.9 kg (284 lb 2.8 oz) (06/04 0600)  Physical Examination: Gen: chronically and critically  ill appearing obese male with bipap pn HEENT: NCAT, PERRL, BIPAP in place PULM:RR high 20s, Not  paradoxical, No alae nasii flare, Mild even acc muscle use + CV: RRR, no mgr, difficult to assess JVD AB: BS+, soft, nontender, no hsm Ext: warm, pitting edema noted, no clubbing, no cyanosis Derm: no rash or skin breakdown Neuro: RASS -3 (recent ativan)  Active Problems:  CORONARY ARTERY DISEASE, CABG 2001, last PCI 7/11  DIASTOLIC DYSFUNCTION  C O P D  RESPIRATORY FAILURE, CHRONIC, on BiPap  Diabetes mellitus  Acute respiratory failure with hypoxia   ASSESSMENT AND PLAN  PULMONARY  Lab 01/16/12 0908 01/16/12 0448 01/16/12 0352 01/15/12 1827  PHART 7.385 7.312* 7.429 7.018*  PCO2ART 83.5* 100.5* 74.1* 189.0*  PO2ART 66.0* 121.0* 58.0* 99.9  HCO3 50.4* 50.9* 49.6* 46.4*  O2SAT 92.0 98.0 90.0 97.3     Ventilator Settings: Vent Mode:  [-]  FiO2 (%):  [30 %-100 %] 40 % CXR:  Bilateral heterogeneous opacities consistent with pulmonary edema  -> 6/4 improved aeration ETT:  n/a  A:  Acute on chronic respiratory failure, due to volume overload in setting of cor pulmonale and obesity hypoventilation syndrome and AECOPD;  -on 6/3  improving ABG and mental status after one hour of bipap  - on 6/4: got worse again with agitation and then better after ativan (chronic benzo at home) and reduced bipap settings P:   BIPAP  -sit up in bed -continue diuresis with IV lasix q6 hours x 4 doses -will need intubation if mental status declines or cannot protect airway (wife agreeable; see  code discussion below) --start levaquin (improved pseudomonas coverage) -sputum culture -scheduled a/a nebs - scheduled IV steroids  CARDIOVASCULAR  Lab 01/15/12 2015 01/15/12 0901 01/15/12 0027 01/14/12 1645 01/14/12 1405  TROPONINI <0.30 <0.30 <0.30 <0.30 --  LATICACIDVEN -- -- -- -- --  PROBNP 443.8* -- -- -- 358.6*   ECG:  NSR, RBBB, 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 Lines:  A: Acute volume overload in setting of known diastolic dysfunction and pulmonary hypertension; has had increase swelling, weight gain, and CXR  consistent with pulmonary edema; diuretic dose had recently been decreased by nephrologist as outpatient (per admission H&P) P:  -diurese -continue IMDUR, low dose metop -tele -address home lasix dosing prior to discharge  A: PVD, no active ischaemia P: -continue plavix  RENAL  Lab 01/16/12 0440 01/15/12 2015 01/15/12 0605 01/14/12 1405  NA 145 141 146* 146*  K 4.9 5.2* -- --  CL 95* 92* 95* 98  CO2 43* 43* >45* >45*  BUN 51* 51* 44* 47*  CREATININE 1.83* 1.97* 1.66* 1.73*  CALCIUM 9.6 9.5 9.8 9.8  MG -- -- -- --  PHOS -- -- -- --   Intake/Output      06/03 0701 - 06/04 0700 06/04 0701 - 06/05 0700   P.O. 340    Other 50    IV Piggyback 520    Total Intake(mL/kg) 910 (7.1)    Urine (mL/kg/hr) 2675 (0.9) 575   Total Output 2675 575   Net -1765 -575         Foley:  6/3  A:  Acute on chronic renal failure improved with diuresis P: -volume overloaded, continue diuresis -foley -renal dose meds -monitor bmet daily  GASTROINTESTINAL  Lab 01/15/12 0605  AST 14  ALT 8  ALKPHOS 65  BILITOT 0.2*  PROT 7.6  ALBUMIN 3.5    A:  GERD, No acute issues P:   -home ppi - place og tube  HEMATOLOGIC  Lab 01/16/12 0440 01/15/12 2015 01/15/12 0605 01/14/12 1405  HGB 9.8* 9.9* 9.9* 9.6*  HCT 34.8* 35.7* 35.2* 33.9*  PLT 123* 122* 129* 121*  INR -- -- -- --  APTT -- -- 31 --   A:  Anemia of critical illness P:   - - PRBC for hgb </= 6.9gm%    - exceptions are   -  if ACS susepcted/confirmed then transfuse for hgb </= 8.0gm%,  or    -  If septic shock first 24h and scvo2 < 70% then transfuse for hgb </= 9.0gm%   - active bleeding with hemodynamic instability, then transfuse regardless of hemoglobin value   At at all times try to transfuse 1 unit prbc as possible with exception of active hemorrhage   -   INFECTIOUS  Lab 01/16/12 0440 01/15/12 2015 01/15/12 0605 01/14/12 1405  WBC 4.4 4.2 4.3 5.9  PROCALCITON -- 0.10 -- --   Cultures: 6/3 sputum  >>  Antibiotics: 6/2 moxi >> 6/3 6/3 Levaquin >>  A:  Acute tracheobronchitis in setting of COPD, no clear pneumonia (no fever, elevated WBC) P:   -f/u culture - Levaquin for better PSA coverage - track PCT in few days and then decide on 5 v 8 day antibiotic Rx  ENDOCRINE  Lab 01/16/12 0816 01/16/12 0359 01/16/12 0038 01/15/12 2157 01/15/12 1823  GLUCAP 114* 70 113* 234* 365*   A:  DM2   P:   -SSI -add regular q4 hour insulin while npo on bipap -will likely need lantus when taking po -hold metformin (bad idea  in setting of chronic hypoxemia and ckd)  NEUROLOGIC  A:  Baseline valium use.  On 6/3 Acute encephalopathy due to hypercarbia, On 6/4 deliriious again ? Due to hypercarbia v lack of benzo v both P:   -continue bipap -frequent orientation - START PRECEDEX 6/4  BEST PRACTICE / DISPOSITION Level of Care:  ICU. Place PICC 01/16/12 Primary Service:  PCCM Consultants:   Code Status:  NO CPR, NO ACLS CODE, NO DEFIB BUT YES FOR INTUBATION AND FULL MEDICAL CARE AS OF 01/16/12 Diet:  npo DVT Px:  scd GI Px:  ppi Skin Integrity:  Normal per rn Social / Family:  15 MINUTE  bedside conference room conversation with sister Gigi Gin and Wife Corrie Dandy  Family: large extended family. Married to wife for 20 years or more. No biological kids. Wife main Management consultant.    Patient as person: spiritiual to himself. Loves people, family, golf and doing errands. Appears to have lost purpose because of health issues and unable to do things he enjoyes for the last few years. Quality of life is important. Stoic and did not complain about the things he misses to his wife but wife could sense. Been intubated during CABG 20 years ago and maybe once for AECOPD several years abo. BUt for unclear reasons, petrified about intubation. SAw intubatino as a terminal event in his illness. Wife never explored the reasons why. Patient did feel emotively that his life expectancy is limited  Family: large extended  family. Married to wife for 20 years or more. No biological kids. Wife always rejected his fear of intubation and mechanical ventilation but never questioned his rationale. Wife feels better to intubate to give him the opportunity to get better and go home but agrees quality of life is important. CPR discussed and given poor outcomes with procedure, she prefers he should not have CPR   The patient is critically ill with multiple organ systems failure and requires high complexity decision making for assessment and support, frequent evaluation and titration of therapies, application of advanced monitoring technologies and extensive interpretation of multiple databases.   Critical Care Time devoted to patient care services described in this note is  45  Minutes.  Dr. Kalman Shan, M.D., [facility].C.P Pulmonary and Critical Care Medicine Staff Physician [facility] Pulmonary and Critical Care Pager: [phone], If no answer or between  15:00h - 7:00h: call [phone]  01/16/2012 9:57 AM

## 2012-01-16 NOTE — Progress Notes (Signed)
Increased FIO2 to 40% due to ABG PO2 70

## 2012-01-16 NOTE — Progress Notes (Signed)
CRITICAL VALUE ALERT  Critical value received:  CO2 43   Date of notification:  01/15/2012  Time of notification:  2015pm  Critical value read back:yes  Nurse who received alert:  L Aleeah Greeno RN  Responding MD:  Pola Corn MD P. Delford Field  Time MD responded:  2015pm  Value consistent with previous values, MD aware.

## 2012-01-16 NOTE — Progress Notes (Signed)
ANTIBIOTIC CONSULT NOTE - INITIAL  Pharmacy Consult for Vanco/Zosyn Indication: pneumonia  Allergies  Allergen Reactions  . Amlodipine Besy-Benazepril Hcl Swelling    Lips swell  . Percocet (Oxycodone-Acetaminophen) Other (See Comments)    hallucinations    Patient Measurements: Height: 6\' 1"  (185.4 cm) Weight: 284 lb 2.8 oz (128.9 kg) (bed wt) IBW/kg (Calculated) : 79.9  Adjusted Body Weight:    Vital Signs: Temp: 99 F (37.2 C) (06/04 1549) Temp src: Axillary (06/04 1549) BP: 137/70 mmHg (06/04 1600) Pulse Rate: 80  (06/04 1600) Intake/Output from previous day: 06/03 0701 - 06/04 0700 In: 910 [P.O.:340; IV Piggyback:520] Out: 2675 [Urine:2675] Intake/Output from this shift: Total I/O In: 46 [I.V.:32; IV Piggyback:14] Out: 1805 [Urine:1805]  Labs:  [facility] 01/16/12 0440 01/15/12 2015 01/15/12 0605  WBC 4.4 4.2 4.3  HGB 9.8* 9.9* 9.9*  PLT 123* 122* 129*  LABCREA -- -- --  CREATININE 1.83* 1.97* 1.66*   Estimated Creatinine Clearance: 61.2 ml/min (by C-G formula based on Cr of 1.83). No results found for this basename: VANCOTROUGH:2,VANCOPEAK:2,VANCORANDOM:2,GENTTROUGH:2,GENTPEAK:2,GENTRANDOM:2,TOBRATROUGH:2,TOBRAPEAK:2,TOBRARND:2,AMIKACINPEAK:2,AMIKACINTROU:2,AMIKACIN:2, in the last 72 hours   Microbiology: Recent Results (from the past 720 hour(s))  MRSA PCR SCREENING     Status: Normal   Collection Time   01/15/12  7:59 PM      Component Value Range Status Comment   MRSA by PCR NEGATIVE  NEGATIVE  Final   CULTURE, EXPECTORATED SPUTUM-ASSESSMENT     Status: Normal   Collection Time   01/15/12  9:40 PM      Component Value Range Status Comment   Specimen Description SPUTUM   Final    Special Requests NONE   Final    Sputum evaluation     Final    Value: THIS SPECIMEN IS ACCEPTABLE. RESPIRATORY CULTURE REPORT TO FOLLOW.   Report Status 01/15/2012 FINAL   Final   CULTURE, RESPIRATORY     Status: Normal (Preliminary result)   Collection Time   01/15/12  9:40  PM      Component Value Range Status Comment   Specimen Description SPUTUM   Final    Special Requests NONE   Final    Gram Stain     Final    Value: RARE WBC PRESENT,BOTH PMN AND MONONUCLEAR     NO SQUAMOUS EPITHELIAL CELLS SEEN     NO ORGANISMS SEEN   Culture PENDING   Incomplete    Report Status PENDING   Incomplete     Medical History: Past Medical History  Diagnosis Date  . Heart disease, unspecified   . Coronary atherosclerosis of unspecified type of vessel, native or graft   . Chronic pulmonary heart disease, unspecified   . Other hyperalimentation   . Chronic respiratory failure   . Unspecified sleep apnea   . Chronic airway obstruction, not elsewhere classified   . Angina   . Shortness of breath   . Anemia   . Chronic kidney disease   . Anxiety   . Hypertension   . CHF (congestive heart failure)   . Blood transfusion     no reaction from transfusion  . Arthritis     hx of gout  . Diabetes mellitus   . CORONARY ARTERY DISEASE 10/28/2007  . CPAP (continuous positive airway pressure) dependence   . GERD (gastroesophageal reflux disease)     Medications:  Prescriptions prior to admission  Medication Sig Dispense Refill  . albuterol (PROVENTIL HFA;VENTOLIN HFA) 108 (90 BASE) MCG/ACT inhaler Inhale 2 puffs into the lungs every  6 (six) hours as needed. For wheezing      . aspirin EC 81 MG tablet Take 81 mg by mouth daily.      . clopidogrel (PLAVIX) 75 MG tablet Take 75 mg by mouth daily.      Marland Kitchen COLCRYS 0.6 MG tablet Take 1 tablet by mouth daily.      . diazepam (VALIUM) 5 MG tablet Take 5 mg by mouth at bedtime as needed. For anxiety/sleep      . Fluticasone-Salmeterol (ADVAIR) 250-50 MCG/DOSE AEPB Inhale 1 puff into the lungs every 12 (twelve) hours as needed. For wheezing      . furosemide (LASIX) 80 MG tablet Take 80 mg by mouth 2 (two) times daily.       . isosorbide mononitrate (IMDUR) 30 MG 24 hr tablet Take 45 mg by mouth daily.       . metFORMIN  (GLUCOPHAGE-XR) 750 MG 24 hr tablet Take 750 mg by mouth 2 (two) times daily.       . metoprolol tartrate (LOPRESSOR) 25 MG tablet Take 12.5 mg by mouth 2 (two) times daily.      . Multiple Vitamin (MULITIVITAMIN WITH MINERALS) TABS Take 1 tablet by mouth daily.      . nitroGLYCERIN (NITROSTAT) 0.4 MG SL tablet Place 0.4 mg under the tongue every 5 (five) minutes as needed. For chest pain      . pantoprazole (PROTONIX) 40 MG tablet Take 1 tablet by mouth daily.      . potassium chloride SA (K-DUR,KLOR-CON) 20 MEQ tablet Take 20 mEq by mouth 2 (two) times daily.       . rosuvastatin (CRESTOR) 20 MG tablet Take 20 mg by mouth daily.       . Tamsulosin HCl (FLOMAX) 0.4 MG CAPS Take 1 tablet by mouth daily.      Marland Kitchen tiotropium (SPIRIVA) 18 MCG inhalation capsule Place 18 mcg into inhaler and inhale daily as needed. For wheezing       Assessment: Patient admitted 6/2 with SOB, and cough productive of green sputum. Placed on Bipap; now intubated with acute respiratory failure. Contributing co-morbidities include COPD, pulmonary HTN, obesity hypoventilation syndrome, diastolic HF, CAD, GERD, CKD. Tmax today 99.8. WBC ok. Scr 1.83 (down).  Goal of Therapy:  Vancomycin trough level 15-20 mcg/ml  Plan:  Vancomycin 2500mg  IV loading dose then 1250mg  IV q12hr. Check trough after 3-5 doses at steady state. Zosyn 3.375g IV q8hrs.  [name] [name] 01/16/2012,4:56 PM

## 2012-01-16 NOTE — Progress Notes (Signed)
eLink Physician-Brief Progress Note Patient Name: [name] [name] DOB: 08-28-1950 MRN: [id number]  Date of Service  01/16/2012   HPI/Events of Note  Pt agitated on intermittent sedation  eICU Interventions  Start continuous sedation   Intervention Category Major Interventions: Delirium, psychosis, severe agitation - evaluation and management  Shan Levans 01/16/2012, 5:34 PM

## 2012-01-16 NOTE — Progress Notes (Signed)
CRITICAL VALUE ALERT  Critical value received:  CO2 43  Date of notification:  01/16/2012  Time of notification:  0542am  Critical value read back:yes  Nurse who received alert:  L Trishia Cuthrell RN  MD notified (1st page):  CCM MD at bedside, McQuaid  Value consistent with previously called value. MD aware.

## 2012-01-16 NOTE — Progress Notes (Addendum)
At 1615 pt was emergently intubated. 20mg  of amidate, 4mg  versed, fentanyl, and 8 of bromide given rapidly per the verbal order of [name] PA and Dr Molli Knock  Upon intubation, chlorhexidine mouth wash admin  Felipa Emory

## 2012-01-16 NOTE — Progress Notes (Signed)
Increased pts ipap to 18 to give more support and ventilation.Marland KitchenMarland Kitchen

## 2012-01-16 NOTE — Progress Notes (Signed)
md reduced fio2

## 2012-01-17 DIAGNOSIS — G9340 Encephalopathy, unspecified: Secondary | ICD-10-CM

## 2012-01-17 DIAGNOSIS — I2510 Atherosclerotic heart disease of native coronary artery without angina pectoris: Secondary | ICD-10-CM

## 2012-01-17 LAB — POCT I-STAT 3, ART BLOOD GAS (G3+)
Acid-Base Excess: 27 mmol/L — ABNORMAL HIGH (ref 0.0–2.0)
Bicarbonate: 50.6 mEq/L — ABNORMAL HIGH (ref 20.0–24.0)
Patient temperature: 98.1
Patient temperature: 98.61
TCO2: >50 mmol/L (ref 0–100)
pH, Arterial: 7.613 (ref 7.350–7.450)
pO2, Arterial: 210 mmHg — ABNORMAL HIGH (ref 80.0–100.0)

## 2012-01-17 LAB — GLUCOSE, CAPILLARY
Glucose-Capillary: 166 mg/dL — ABNORMAL HIGH (ref 70–99)
Glucose-Capillary: 173 mg/dL — ABNORMAL HIGH (ref 70–99)
Glucose-Capillary: 185 mg/dL — ABNORMAL HIGH (ref 70–99)
Glucose-Capillary: 185 mg/dL — ABNORMAL HIGH (ref 70–99)
Glucose-Capillary: 185 mg/dL — ABNORMAL HIGH (ref 70–99)
Glucose-Capillary: 200 mg/dL — ABNORMAL HIGH (ref 70–99)
Glucose-Capillary: 219 mg/dL — ABNORMAL HIGH (ref 70–99)

## 2012-01-17 MED ORDER — DOCUSATE SODIUM 50 MG/5ML PO LIQD
100.0000 mg | Freq: Two times a day (BID) | ORAL | Status: DC
  Administered 2012-01-17 – 2012-01-19 (×5): 100 mg via ORAL
  Filled 2012-01-17 (×7): qty 10

## 2012-01-17 MED ORDER — PRO-STAT SUGAR FREE PO LIQD
30.0000 mL | Freq: Every day | ORAL | Status: DC
  Administered 2012-01-17 – 2012-01-20 (×14): 30 mL
  Filled 2012-01-17 (×24): qty 30

## 2012-01-17 MED ORDER — FUROSEMIDE 10 MG/ML IJ SOLN
40.0000 mg | Freq: Two times a day (BID) | INTRAMUSCULAR | Status: DC
  Administered 2012-01-17 – 2012-01-18 (×3): 40 mg via INTRAVENOUS
  Filled 2012-01-17 (×4): qty 4

## 2012-01-17 MED ORDER — ADULT MULTIVITAMIN LIQUID CH
5.0000 mL | Freq: Every day | ORAL | Status: DC
  Administered 2012-01-18 – 2012-01-19 (×2): 5 mL
  Filled 2012-01-17 (×4): qty 5

## 2012-01-17 MED ORDER — OSMOLITE 1.5 CAL PO LIQD
1000.0000 mL | ORAL | Status: DC
  Administered 2012-01-17 – 2012-01-19 (×3): 1000 mL
  Filled 2012-01-17 (×7): qty 1000

## 2012-01-17 NOTE — Progress Notes (Signed)
Nursing Note  0400 ABG results called to [facility] MD. No new orders at this time. Will continue to monitor.  L Laneice Meneely RN

## 2012-01-17 NOTE — Progress Notes (Signed)
rn pulled abg from aline with results pH-7.6, CO2-50 PO2- 260, turned fio2 down to 0.50, another abg due @ 0400, reassessment to follow.Marland Kitchen

## 2012-01-17 NOTE — Progress Notes (Signed)
New setting per md due to 0000 blood gas

## 2012-01-17 NOTE — Progress Notes (Addendum)
INITIAL ADULT NUTRITION ASSESSMENT Date: 01/17/2012   Time: 11:59 AM  Reason for Assessment: Consult, Low Braden  ASSESSMENT: Male 61 y.o.  Dx: acute on chronic respiratory failure, acute volume overload in setting of known diastolic dysfunction and pulmonary HTN  Hx:  Past Medical History  Diagnosis Date  . Heart disease, unspecified   . Coronary atherosclerosis of unspecified type of vessel, native or graft   . Chronic pulmonary heart disease, unspecified   . Other hyperalimentation   . Chronic respiratory failure   . Unspecified sleep apnea   . Chronic airway obstruction, not elsewhere classified   . Angina   . Shortness of breath   . Anemia   . Chronic kidney disease   . Anxiety   . Hypertension   . CHF (congestive heart failure)   . Blood transfusion     no reaction from transfusion  . Arthritis     hx of gout  . Diabetes mellitus   . CORONARY ARTERY DISEASE 10/28/2007  . CPAP (continuous positive airway pressure) dependence   . GERD (gastroesophageal reflux disease)     Related Meds:  Past Medical History  Diagnosis Date  . Heart disease, unspecified   . Coronary atherosclerosis of unspecified type of vessel, native or graft   . Chronic pulmonary heart disease, unspecified   . Other hyperalimentation   . Chronic respiratory failure   . Unspecified sleep apnea   . Chronic airway obstruction, not elsewhere classified   . Angina   . Shortness of breath   . Anemia   . Chronic kidney disease   . Anxiety   . Hypertension   . CHF (congestive heart failure)   . Blood transfusion     no reaction from transfusion  . Arthritis     hx of gout  . Diabetes mellitus   . CORONARY ARTERY DISEASE 10/28/2007  . CPAP (continuous positive airway pressure) dependence   . GERD (gastroesophageal reflux disease)     Ht: 6\' 1"  (185.4 cm)  Wt: 262 lb 2 oz (118.9 kg)  Ideal Wt: 83.6 kg % Ideal Wt: 142%  Usual Wt: ~ 276 lb % Usual Wt: 95%  Body mass index is 34.58  kg/(m^2).  Food/Nutrition Related Hx: no triggers per admission nutrition screen  Labs:  CMP     Component Value Date/Time   NA 145 01/16/2012 0440   K 4.9 01/16/2012 0440   CL 95* 01/16/2012 0440   CO2 43* 01/16/2012 0440   GLUCOSE 74 01/16/2012 0440   BUN 51* 01/16/2012 0440   CREATININE 1.83* 01/16/2012 0440   CALCIUM 9.6 01/16/2012 0440   CALCIUM 8.5 12/10/2010 0400   PROT 7.6 01/15/2012 0605   ALBUMIN 3.5 01/15/2012 0605   AST 14 01/15/2012 0605   ALT 8 01/15/2012 0605   ALKPHOS 65 01/15/2012 0605   BILITOT 0.2* 01/15/2012 0605   GFRNONAA 39* 01/16/2012 0440   GFRAA 45* 01/16/2012 0440     Intake/Output Summary (Last 24 hours) at 01/17/12 1203 Last data filed at 01/17/12 1100  Gross per 24 hour  Intake 1442.94 ml  Output   4365 ml  Net -2922.06 ml    CBG (last 3)   Basename 01/17/12 1147 01/17/12 0738 01/17/12 0348  GLUCAP 185* 185* 173*    Phosphorus  Date Value Range Status  07/15/2011 2.4  2.3-4.6 (mg/dL) Final    Diet Order: NPO  Supplements/Tube Feeding: N/A  IVF:    fentaNYL infusion INTRAVENOUS Last Rate: 150 mcg/hr (01/17/12 1012)  midazolam (VERSED) infusion Last Rate: 3 mg/hr (01/17/12 1012)  DISCONTD: dexmedetomidine (PRECEDEX) IV infusion Last Rate: 0.2 mcg/kg/hr (01/16/12 1003)    Estimated Nutritional Needs:   Kcal: 2200 Protein: 130-140 gm  Fluid: per MD  Patient presented to ED complaining of worsening SOB and cough for last 3 days; relates productive cough and greenish sputum; patient currently intubated & sedated; OGT in place; RD spoke with patient's wife regarding nutrition hx -- PTA patient had a good appetite, eating well; his weight does fluctuate given CHF history -- does weigh himself at home; patient at risk for skin breakdown given low braden score; RD consulted per CCM to initiate and manage EN  NUTRITION DIAGNOSIS: -Inadequate oral intake (NI-2.1).  Status: Ongoing  RELATED TO: inability to eat, VDRF  AS EVIDENCE BY: NPO  status  MONITORING/EVALUATION(Goals): Goal: EN to provide 60-70% of estimated calorie needs (22-25 kcals/kg ideal body weight) and < 90% of estimated protein needs, based on ASPEN guidelines for permissive underfeeding in critically ill obese individuals Monitor: EN regimen/tolerance, respiratory status, weight, labs, I/O's  EDUCATION NEEDS: -No education needs identified at this time  INTERVENTION:  Initiate Osmolite 1.5 formula at 15 ml/hr and increase by 10 ml every 4 hours to goal rate of 25 ml/hr with Prostat liquid protein 30 ml 6 times daily via tube to provide 1500 total kcals (68% of estimated kcal needs), 128 gm protein (98% of estimated protein needs), 457 ml of free water  Add liquid MVI daily via tube  RD to follow for nutrition care plan  Dietitian [phone]  DOCUMENTATION CODES Per approved criteria  -Obesity Unspecified    Alger Memos 01/17/2012, 11:59 AM

## 2012-01-17 NOTE — Progress Notes (Signed)
ETT advanced 2 cm per MD.

## 2012-01-17 NOTE — Progress Notes (Signed)
Nursing Note  ABG results called to [facility] MD.  Changes to ventilator made by RT, will follow up ABG at 0400am.  L Dayana Dalporto RN

## 2012-01-17 NOTE — Progress Notes (Signed)
Decreased pts fio2 due to abg results of po2 of 210

## 2012-01-17 NOTE — Progress Notes (Signed)
Name: [name] [name] MRN: [id number] DOB: 1950-08-26    LOS: 3  Referring Provider:  Carmell Austria Reason for Referral:  Hypercapnic respiratory failure  LOS 3 days Date of admit: 01/14/2012 12:37 PM\  PULMONARY / CRITICAL CARE MEDICINE  HPI:  This is a 61 y/o male with COPD, Pulmonary Hypertension, Obesity Hypoventilation syndrome, Diastolic heart failure, CAD s/p CABG, GERD and CKD who was admitted on 01/14/12 with shortness of breath, cough productive of green sputum for three days.  He was admitted to [facility] with a plan to gently diurese and treat possible pneumonia with avelox.  He became more lethargic during 6/3 and an ABG noted hypercapnic respiratory failure.  PCCM was consulted  01/15/12 for further management.  His wife notes that he has not been able to use his BIPAP machine prior to admission because of shortness of breath.    Brief patient description:  61 y/o male with COPD, PH, OHVS, CHF who was admitted on 6/2 with respiratory failure and later developed hypercapnic respiratory failure on 6/3.  He was placed on BIPAP.  Events Since Admission: 6/4 - agitated and intubated   SUBJECTIVE/OVERNIGHT/INTERVAL HX Intubated. Calm on sedation and followed commands. Failed SBT  Wife Corrie Dandy and sister PEggy at bedside   Vital Signs: Temp:  [98 F (36.7 C)-99.8 F (37.7 C)] 98 F (36.7 C) (06/05 0736) Pulse Rate:  [63-99] 63  (06/05 1000) Resp:  [6-30] 12  (06/05 1000) BP: (109-161)/(53-84) 121/57 mmHg (06/05 1000) SpO2:  [96 %-100 %] 100 % (06/05 1000) FiO2 (%):  [30 %-100 %] 30 % (06/05 0812) Weight:  [118.9 kg (262 lb 2 oz)] 118.9 kg (262 lb 2 oz) (06/05 0600)  Physical Examination: Gen: chronically and critically  ill appearing obese male on ventilator HEENT: NCAT, PERRL, BIPAP in place PULM: On ventilator. No wheeze.  RR 12 and no breath stacking CV: RRR, no mgr, difficult to assess JVD AB: BS+, soft, nontender, no hsm Ext: warm, pitting edema noted, no clubbing, no  cyanosis Derm: no rash or skin breakdown Neuro: RASS -3 (sedation)  Active Problems:  CORONARY ARTERY DISEASE, CABG 2001, last PCI 7/11  DIASTOLIC DYSFUNCTION  C O P D  RESPIRATORY FAILURE, CHRONIC, on BiPap  Diabetes mellitus  Acute respiratory failure with hypoxia  Encephalopathy acute  Acute respiratory failure  Metabolic alkalosis  Respiratory acidosis   ASSESSMENT AND PLAN  PULMONARY  Lab 01/17/12 0418 01/17/12 0016 01/16/12 1531 01/16/12 0908 01/16/12 0448  PHART 7.647* 7.613* 7.409 7.385 7.312*  PCO2ART 46.2* 50.1* 82.0* 83.5* 100.5*  PO2ART 210.0* 260.0* 63.0* 66.0* 121.0*  HCO3 50.6* 50.8* 51.8* 50.4* 50.9*  O2SAT 100.0 100.0 90.0 92.0 98.0     Ventilator Settings: Vent Mode:  [-] PRVC FiO2 (%):  [30 %-100 %] 30 % Set Rate:  [16 bmp-20 bmp] 16 bmp Vt Set:  [500 mL-550 mL] 500 mL PEEP:  [5 cmH20] 5 cmH20 Pressure Support:  [14 cmH20] 14 cmH20 Plateau Pressure:  [0 cmH20-41 cmH20] 0 cmH20 CXR:  Bilateral heterogeneous opacities consistent with pulmonary edema  -> 6/4 improved aeration ETT:  n/a  A:  Acute on chronic respiratory failure, due to volume overload in setting of cor pulmonale and obesity hypoventilation syndrome and AECOPD;  -on 6/3  improving ABG and mental status after one hour of bipap  - on 6/4: got worse again with agitation intubated  - on 6/5: failed SBT P:   DAily SBT   CARDIOVASCULAR  Lab 01/15/12 2015 01/15/12 0901 01/15/12 0027 01/14/12  1645 01/14/12 1405  TROPONINI <0.30 <0.30 <0.30 <0.30 --  LATICACIDVEN -- -- -- -- --  PROBNP 443.8* -- -- -- 358.6*   ECG:  NSR, RBBB, likely RVH Lines:  A: Acute volume overload in setting of known diastolic dysfunction and pulmonary hypertension; has had increase swelling, weight gain, and CXR consistent with pulmonary edema; diuretic dose had recently been decreased by nephrologist as outpatient (per admission H&P) P:  -diurese -continue IMDUR, low dose metop -tele -address home lasix  dosing prior to discharge  A: PVD, no active ischaemia P: -continue plavix  RENAL  Lab 01/16/12 0440 01/15/12 2015 01/15/12 0605 01/14/12 1405  NA 145 141 146* 146*  K 4.9 5.2* -- --  CL 95* 92* 95* 98  CO2 43* 43* >45* >45*  BUN 51* 51* 44* 47*  CREATININE 1.83* 1.97* 1.66* 1.73*  CALCIUM 9.6 9.5 9.8 9.8  MG -- -- -- --  PHOS -- -- -- --   Intake/Output      06/04 0701 - 06/05 0700 06/05 0701 - 06/06 0700   P.O.     I.V. (mL/kg) 540.7 (4.5) 54 (0.5)   Other     NG/GT 100 30   IV Piggyback 698 12.5   Total Intake(mL/kg) 1338.7 (11.3) 96.5 (0.8)   Urine (mL/kg/hr) 4090 (1.4) 400   Emesis/NG output 600 400   Total Output 4690 800   Net -3351.3 -703.5         Foley:  6/3  A:  Acute on chronic renal failure improved with diuresis P: -volume overloaded, continue diuresis -foley -renal dose meds -monitor bmet daily  GASTROINTESTINAL  Lab 01/15/12 0605  AST 14  ALT 8  ALKPHOS 65  BILITOT 0.2*  PROT 7.6  ALBUMIN 3.5    A:  GERD, No acute issues P:   -home ppi - place og tube - start tube feeds 01/17/12 (nutn consult +)  HEMATOLOGIC  Lab 01/16/12 0440 01/15/12 2015 01/15/12 0605 01/14/12 1405  HGB 9.8* 9.9* 9.9* 9.6*  HCT 34.8* 35.7* 35.2* 33.9*  PLT 123* 122* 129* 121*  INR -- -- -- --  APTT -- -- 31 --   A:  Anemia of critical illness P:   - - PRBC for hgb </= 6.9gm%    - exceptions are   -  if ACS susepcted/confirmed then transfuse for hgb </= 8.0gm%,  or    -  If septic shock first 24h and scvo2 < 70% then transfuse for hgb </= 9.0gm%   - active bleeding with hemodynamic instability, then transfuse regardless of hemoglobin value   At at all times try to transfuse 1 unit prbc as possible with exception of active hemorrhage   -   INFECTIOUS  Lab 01/16/12 0440 01/15/12 2015 01/15/12 0605 01/14/12 1405  WBC 4.4 4.2 4.3 5.9  PROCALCITON -- 0.10 -- --   Cultures: 6/3 sputum >>  Antibiotics: 6/2 moxi >> 6/3 6/3 Levaquin >>  A:  Acute  tracheobronchitis in setting of COPD, no clear pneumonia (no fever, elevated WBC) P:   -f/u culture - Levaquin for better PSA coverage - track PCT in on 01/18/12 and then decide on 5 v 8 day antibiotic Rx  ENDOCRINE  Lab 01/17/12 0738 01/17/12 0348 01/17/12 0013 01/16/12 1954 01/16/12 1549  GLUCAP 185* 173* 166* 197* 97   A:  DM2   P:   -SSI -add regular q4 hour insulin while npo on bipap -will likely need lantus when taking po -hold metformin (bad idea  in setting of chronic hypoxemia and ckd)  NEUROLOGIC  A:  Baseline valium use.  On 6/3 Acute encephalopathy due to hypercarbia, On 6/4 deliriious again ? Due to hypercarbia v lack of benzo v both -> intubatd On 6/4 - on cont sedation Normal WUA P:   -fent.versed gtt  BEST PRACTICE / DISPOSITION Level of Care:  ICU. Place PICC 01/16/12 Primary Service:  PCCM Consultants:   Code Status:  NO CPR, NO ACLS CODE, NO DEFIB BUT YES FOR INTUBATION AND FULL MEDICAL CARE AS OF 01/16/12 Diet:  npo DVT Px:  scd GI Px:  ppi Skin Integrity:  Normal per rn Social / Family:  15 MINUTE  bedside conference room conversation with sister Gigi Gin and Wife Corrie Dandy  Family: large extended family. Married to wife for 20 years or more. No biological kids. Wife main Management consultant.    Patient as person: spiritiual to himself. Loves people, family, golf and doing errands. Appears to have lost purpose because of health issues and unable to do things he enjoyes for the last few years. Quality of life is important. Stoic and did not complain about the things he misses to his wife but wife could sense. Been intubated during CABG 20 years ago and maybe once for AECOPD several years abo. BUt for unclear reasons, petrified about intubation. SAw intubatino as a terminal event in his illness. Wife never explored the reasons why. Patient did feel emotively that his life expectancy is limited  Family: large extended family. Married to wife for 20 years or more. No biological  kids. Wife always rejected his fear of intubation and mechanical ventilation but never questioned his rationale. Wife feels better to intubate to give him the opportunity to get better and go home but agrees quality of life is important. CPR discussed and given poor outcomes with procedure, she prefers he should not have CPR   The patient is critically ill with multiple organ systems failure and requires high complexity decision making for assessment and support, frequent evaluation and titration of therapies, application of advanced monitoring technologies and extensive interpretation of multiple databases.   Critical Care Time devoted to patient care services described in this note is  35  Minutes.  Dr. Kalman Shan, M.D., [facility].C.P Pulmonary and Critical Care Medicine Staff Physician [facility] Pager: [phone], If no answer or between  15:00h - 7:00h: call [phone]  01/17/2012 10:42 AM

## 2012-01-18 ENCOUNTER — Inpatient Hospital Stay (HOSPITAL_COMMUNITY): Payer: Medicare Other

## 2012-01-18 DIAGNOSIS — D649 Anemia, unspecified: Secondary | ICD-10-CM

## 2012-01-18 LAB — CBC
MCHC: 29.3 g/dL — ABNORMAL LOW (ref 30.0–36.0)
Platelets: 149 10*3/uL — ABNORMAL LOW (ref 150–400)
RDW: 13.4 % (ref 11.5–15.5)
WBC: 8.8 10*3/uL (ref 4.0–10.5)

## 2012-01-18 LAB — COMPREHENSIVE METABOLIC PANEL
AST: 9 U/L (ref 0–37)
Albumin: 3.3 g/dL — ABNORMAL LOW (ref 3.5–5.2)
BUN: 87 mg/dL — ABNORMAL HIGH (ref 6–23)
Chloride: 95 mEq/L — ABNORMAL LOW (ref 96–112)
Creatinine, Ser: 2.58 mg/dL — ABNORMAL HIGH (ref 0.50–1.35)
Potassium: 3.9 mEq/L (ref 3.5–5.1)
Total Protein: 7 g/dL (ref 6.0–8.3)

## 2012-01-18 LAB — GLUCOSE, CAPILLARY
Glucose-Capillary: 185 mg/dL — ABNORMAL HIGH (ref 70–99)
Glucose-Capillary: 191 mg/dL — ABNORMAL HIGH (ref 70–99)
Glucose-Capillary: 183 mg/dL — ABNORMAL HIGH (ref 70–99)
Glucose-Capillary: 167 mg/dL — ABNORMAL HIGH (ref 70–99)
Glucose-Capillary: 239 mg/dL — ABNORMAL HIGH (ref 70–99)

## 2012-01-18 LAB — MAGNESIUM: Magnesium: 2.3 mg/dL (ref 1.5–2.5)

## 2012-01-18 LAB — CULTURE, RESPIRATORY W GRAM STAIN: Culture: NORMAL

## 2012-01-18 LAB — PRO B NATRIURETIC PEPTIDE: Pro B Natriuretic peptide (BNP): 423.2 pg/mL — ABNORMAL HIGH (ref 0–125)

## 2012-01-18 LAB — PROCALCITONIN: Procalcitonin: 0.11 ng/mL

## 2012-01-18 MED ORDER — ALBUMIN HUMAN 5 % IV SOLN
INTRAVENOUS | Status: AC
  Administered 2012-01-18: 12.5 g
  Filled 2012-01-18: qty 250

## 2012-01-18 MED ORDER — METHYLPREDNISOLONE SODIUM SUCC 125 MG IJ SOLR
60.0000 mg | Freq: Two times a day (BID) | INTRAMUSCULAR | Status: DC
  Administered 2012-01-18 – 2012-01-19 (×2): 60 mg via INTRAVENOUS
  Filled 2012-01-18 (×2): qty 0.96

## 2012-01-18 MED ORDER — FENTANYL CITRATE 0.05 MG/ML IJ SOLN
25.0000 ug | INTRAMUSCULAR | Status: DC | PRN
Start: 2012-01-18 — End: 2012-01-23
  Administered 2012-01-18 (×2): 25 ug via INTRAVENOUS
  Administered 2012-01-19: 50 ug via INTRAVENOUS
  Filled 2012-01-18 (×3): qty 2

## 2012-01-18 MED ORDER — MOXIFLOXACIN HCL IN NACL 400 MG/250ML IV SOLN
400.0000 mg | INTRAVENOUS | Status: DC
  Administered 2012-01-18: 400 mg via INTRAVENOUS
  Filled 2012-01-18 (×2): qty 250

## 2012-01-18 MED ORDER — PANTOPRAZOLE SODIUM 40 MG PO PACK
40.0000 mg | PACK | Freq: Every day | ORAL | Status: DC
  Administered 2012-01-19: 40 mg
  Filled 2012-01-18 (×2): qty 20

## 2012-01-18 MED ORDER — ALBUMIN HUMAN 25 % IV SOLN
12.5000 g | Freq: Once | INTRAVENOUS | Status: DC
  Filled 2012-01-18: qty 50

## 2012-01-18 MED ORDER — SODIUM CHLORIDE 0.9 % IV SOLN
0.2000 ug/kg/h | INTRAVENOUS | Status: DC
  Administered 2012-01-18: 0.7 ug/kg/h via INTRAVENOUS
  Administered 2012-01-18: 0.8 ug/kg/h via INTRAVENOUS
  Administered 2012-01-18: 0.7 ug/kg/h via INTRAVENOUS
  Administered 2012-01-18: 0.2 ug/kg/h via INTRAVENOUS
  Administered 2012-01-19 (×2): 1 ug/kg/h via INTRAVENOUS
  Administered 2012-01-19: 0.8 ug/kg/h via INTRAVENOUS
  Administered 2012-01-19: 0.3 ug/kg/h via INTRAVENOUS
  Administered 2012-01-19: 1 ug/kg/h via INTRAVENOUS
  Administered 2012-01-19 – 2012-01-20 (×2): 0.4 ug/kg/h via INTRAVENOUS
  Filled 2012-01-18 (×15): qty 2

## 2012-01-18 MED ORDER — VANCOMYCIN HCL 1000 MG IV SOLR: 1750.0000 mg | INTRAVENOUS | Status: DC

## 2012-01-18 NOTE — Progress Notes (Signed)
ANTIBIOTIC CONSULT NOTE - FOLLOW UP  Pharmacy Consult for vancomycin/zosyn Indication: Acute tracheobronchitis in setting of COPD   Allergies  Allergen Reactions  . Amlodipine Besy-Benazepril Hcl Swelling    Lips swell  . Percocet (Oxycodone-Acetaminophen) Other (See Comments)    hallucinations    Patient Measurements: Height: 6\' 1"  (185.4 cm) Weight: 266 lb 1.5 oz (120.7 kg) IBW/kg (Calculated) : 79.9   Vital Signs: Temp: 97.4 F (36.3 C) (06/06 0849) Temp src: Oral (06/06 0849) BP: 104/46 mmHg (06/06 0900) Pulse Rate: 73  (06/06 0900) Intake/Output from previous day: 06/05 0701 - 06/06 0700 In: 1867.5 [I.V.:466.5; NG/GT:430; IV Piggyback:971] Out: 2465 [Urine:2065; Emesis/NG output:400] Intake/Output from this shift: Total I/O In: 92 [I.V.:12; NG/GT:80] Out: 225 [Urine:225]  Labs:  [facility] 01/18/12 0400 01/16/12 0440 01/15/12 2015  WBC 8.8 4.4 4.2  HGB 9.9* 9.8* 9.9*  PLT 149* 123* 122*  LABCREA -- -- --  CREATININE 2.58* 1.83* 1.97*   Estimated Creatinine Clearance: 41.9 ml/min (by C-G formula based on Cr of 2.58). No results found for this basename: VANCOTROUGH:2,VANCOPEAK:2,VANCORANDOM:2,GENTTROUGH:2,GENTPEAK:2,GENTRANDOM:2,TOBRATROUGH:2,TOBRAPEAK:2,TOBRARND:2,AMIKACINPEAK:2,AMIKACINTROU:2,AMIKACIN:2, in the last 72 hours     Assessment: 61 yo male with AECOPD and acute tracheobronchitis on vancomycin and zosyn. Renal function is worsening with SCr=2.58 and CrCl~40. Patient noted with a recent episode of hypotension, curretnly afebrile, WBC=8.8.  6/4: vanco 6/4 zosyn  6/3- resp cx: normal flora  Goal of Therapy:  Vancomycin trough level 15-20 mcg/ml  Plan:  -Change vancomycin to 1750mg  IV  q24hr (next dose 6/7/113) -No zosyn changes needed. -Will follow renal function closely.  Harland German, Pharm D 01/18/2012 10:07 AM

## 2012-01-18 NOTE — Progress Notes (Signed)
Name: [name] [name] MRN: [id number] DOB: 28-Jan-1951    LOS: 4  Referring Provider:  Carmell Austria Reason for Referral:  Hypercapnic respiratory failure  LOS 4 days Date of admit: 01/14/2012 12:37 PM\  PULMONARY / CRITICAL CARE MEDICINE  BRIEF:  This is a 61 y/o male with COPD, Pulmonary Hypertension, Obesity Hypoventilation syndrome, Diastolic heart failure, CAD s/p CABG, GERD and CKD who was admitted on 01/14/12 with shortness of breath, cough productive of green sputum for three days.  He was admitted to [facility] with a plan to gently diurese and treat possible pneumonia with avelox.  He became more lethargic during 6/3 and an ABG noted hypercapnic respiratory failure.  PCCM was consulted  01/15/12 for further management.  His wife notes that he has not been able to use his BIPAP machine prior to admission because of shortness of breath.   Events Since Admission: 6/4 - agitated and intubated 6/5 - failed wua and sbt   SUBJECTIVE/OVERNIGHT/INTERVAL HX  Failed WUA - got agitated but RN slowly reducing fent/versed gtt SBT on sedation gtt -> failed immediately  Hypotension last night and albumin given. Creat up this morning (note lasix increased yesterday)  RN noticing new systolic murmur  Vital Signs: Temp:  [97.4 F (36.3 C)-98.8 F (37.1 C)] 97.4 F (36.3 C) (06/06 0849) Pulse Rate:  [52-96] 74  (06/06 1000) Resp:  [10-22] 15  (06/06 1000) BP: (85-126)/(36-71) 95/45 mmHg (06/06 1000) SpO2:  [96 %-100 %] 99 % (06/06 1000) Arterial Line BP: (86-137)/(41-68) 104/45 mmHg (06/06 1000) FiO2 (%):  [30 %] 30 % (06/06 0800) Weight:  [120.7 kg (266 lb 1.5 oz)] 120.7 kg (266 lb 1.5 oz) (06/06 0400)  Physical Examination: Gen: chronically and critically  ill appearing obese male on ventilator HEENT: NCAT, PERRL, BIPAP in place PULM: On ventilator. No wheeze.  RR 12 and no breath stacking CV: RRR, no mgr, difficult to assess JVD AB: BS+, soft, nontender, no hsm Ext: warm, pitting edema noted,  no clubbing, no cyanosis Derm: no rash or skin breakdown Neuro: RASS -3 (sedation gtt)  Active Problems:  CORONARY ARTERY DISEASE, CABG 2001, last PCI 7/11  DIASTOLIC DYSFUNCTION  C O P D  RESPIRATORY FAILURE, CHRONIC, on BiPap  Diabetes mellitus  Acute respiratory failure with hypoxia  Encephalopathy acute  Acute respiratory failure  Metabolic alkalosis  Respiratory acidosis   ASSESSMENT AND PLAN  PULMONARY  Lab 01/17/12 0418 01/17/12 0016 01/16/12 1531 01/16/12 0908 01/16/12 0448  PHART 7.647* 7.613* 7.409 7.385 7.312*  PCO2ART 46.2* 50.1* 82.0* 83.5* 100.5*  PO2ART 210.0* 260.0* 63.0* 66.0* 121.0*  HCO3 50.6* 50.8* 51.8* 50.4* 50.9*  O2SAT 100.0 100.0 90.0 92.0 98.0     Ventilator Settings: Vent Mode:  [-] CPAP;PSV FiO2 (%):  [30 %] 30 % Set Rate:  [10 bmp-16 bmp] 10 bmp Vt Set:  [500 mL] 500 mL PEEP:  [5 cmH20] 5 cmH20 Pressure Support:  [12 cmH20] 12 cmH20 Plateau Pressure:  [0 cmH20-35 cmH20] 23 cmH20 CXR:  IMPRESSION:  Stable support apparatus. Residual mild interstitial prominence  with slight improvement in aeration. No convincing pulmonary  edema. Probable small left pleural effusion with left basilar  atelectasis or infiltrate.   ETT:  n/a  A:  Acute on chronic respiratory failure, due to volume overload in setting of cor pulmonale and obesity hypoventilation syndrome and AECOPD;  -on 6/3  improving ABG and mental status after one hour of bipap  - on 6/4: got worse again with agitation intubated  - on  6/5 and 6/6 : failed SBT despite lasix P:   DAily SBT as tolerated   CARDIOVASCULAR  Lab 01/18/12 0400 01/15/12 2015 01/15/12 0901 01/15/12 0027 01/14/12 1645 01/14/12 1405  TROPONINI -- <0.30 <0.30 <0.30 <0.30 --  LATICACIDVEN -- -- -- -- -- --  PROBNP 423.2* 443.8* -- -- -- 358.6*   ECG:  NSR, RBBB, likely RVH Lines:  A: Acute volume overload in setting of known diastolic dysfunction and pulmonary hypertension; has had increase swelling,  weight gain, and CXR consistent with pulmonary edema; diuretic dose had recently been decreased by nephrologist as outpatient (per admission H&P) - on 6/6: did not tolerate lasix (dropped bp and rising creat)  P:  -dc lasix -continue IMDUR, low dose metop -tele -address home lasix dosing prior to discharge  A: PVD, no active ischaemia P: -continue plavix  RENAL  Lab 01/18/12 0400 01/16/12 0440 01/15/12 2015 01/15/12 0605 01/14/12 1405  NA 145 145 141 146* 146*  K 3.9 4.9 -- -- --  CL 95* 95* 92* 95* 98  CO2 38* 43* 43* >45* >45*  BUN 87* 51* 51* 44* 47*  CREATININE 2.58* 1.83* 1.97* 1.66* 1.73*  CALCIUM 9.6 9.6 9.5 9.8 9.8  MG 2.3 -- -- -- --  PHOS 4.4 -- -- -- --   Intake/Output      06/05 0701 - 06/06 0700 06/06 0701 - 06/07 0700   I.V. (mL/kg) 466.5 (3.9) 12 (0.1)   NG/GT 430 80   IV Piggyback 971 16.5   Total Intake(mL/kg) 1867.5 (15.5) 108.5 (0.9)   Urine (mL/kg/hr) 2065 (0.7) 225   Emesis/NG output 400 0   Total Output 2465 225   Net -597.5 -116.5         Foley:  6/3  A:  Acute on chronic renal failure improved with diuresis  -on 6/6 worse after increased diuresis on 6/5 P: -dc lasix -foley -renal dose meds -monitor bmet daily  GASTROINTESTINAL  Lab 01/18/12 0400 01/15/12 0605  AST 9 14  ALT 5 8  ALKPHOS 53 65  BILITOT 0.2* 0.2*  PROT 7.0 7.6  ALBUMIN 3.3* 3.5    A:  GERD, No acute issues P:   -home ppi - place og tube - start tube feeds 01/17/12 (nutn consult +)  HEMATOLOGIC  Lab 01/18/12 0400 01/16/12 0440 01/15/12 2015 01/15/12 0605 01/14/12 1405  HGB 9.9* 9.8* 9.9* 9.9* 9.6*  HCT 33.8* 34.8* 35.7* 35.2* 33.9*  PLT 149* 123* 122* 129* 121*  INR -- -- -- -- --  APTT -- -- -- 31 --   A:  Anemia of critical illness P:   - - PRBC for hgb </= 6.9gm%    - exceptions are   -  if ACS susepcted/confirmed then transfuse for hgb </= 8.0gm%,  or    -  If septic shock first 24h and scvo2 < 70% then transfuse for hgb </= 9.0gm%   - active  bleeding with hemodynamic instability, then transfuse regardless of hemoglobin value   At at all times try to transfuse 1 unit prbc as possible with exception of active hemorrhage   -   INFECTIOUS  Lab 01/18/12 0400 01/16/12 0440 01/15/12 2015 01/15/12 0605 01/14/12 1405  WBC 8.8 4.4 4.2 4.3 5.9  PROCALCITON 0.11 -- 0.10 -- --   Cultures:  Results for orders placed during the hospital encounter of 01/14/12  MRSA PCR SCREENING     Status: Normal   Collection Time   01/15/12  7:59 PM  Component Value Range Status Comment   MRSA by PCR NEGATIVE  NEGATIVE  Final   CULTURE, EXPECTORATED SPUTUM-ASSESSMENT     Status: Normal   Collection Time   01/15/12  9:40 PM      Component Value Range Status Comment   Specimen Description SPUTUM   Final    Special Requests NONE   Final    Sputum evaluation     Final    Value: THIS SPECIMEN IS ACCEPTABLE. RESPIRATORY CULTURE REPORT TO FOLLOW.   Report Status 01/15/2012 FINAL   Final   CULTURE, RESPIRATORY     Status: Normal   Collection Time   01/15/12  9:40 PM      Component Value Range Status Comment   Specimen Description SPUTUM   Final    Special Requests NONE   Final    Gram Stain     Final    Value: RARE WBC PRESENT,BOTH PMN AND MONONUCLEAR     NO SQUAMOUS EPITHELIAL CELLS SEEN     NO ORGANISMS SEEN   Culture NORMAL OROPHARYNGEAL FLORA   Final    Report Status 01/18/2012 FINAL   Final      Antibiotics:  Anti-infectives     Start     Dose/Rate Route Frequency Ordered Stop   01/19/12 0600   vancomycin (VANCOCIN) 1,750 mg in sodium chloride 0.9 % 500 mL IVPB        1,750 mg 250 mL/hr over 120 Minutes Intravenous Every 24 hours 01/18/12 1008     01/17/12 0600   vancomycin (VANCOCIN) 1,250 mg in sodium chloride 0.9 % 250 mL IVPB  Status:  Discontinued        1,250 mg 166.7 mL/hr over 90 Minutes Intravenous Every 12 hours 01/16/12 1709 01/18/12 1008   01/16/12 1800  piperacillin-tazobactam (ZOSYN) IVPB 3.375 g       3.375 g 12.5  mL/hr over 240 Minutes Intravenous Every 8 hours 01/16/12 1709     01/16/12 1715   vancomycin (VANCOCIN) 2,500 mg in sodium chloride 0.9 % 500 mL IVPB        2,500 mg 250 mL/hr over 120 Minutes Intravenous  Once 01/16/12 1709 01/16/12 2021   01/16/12 0100   levofloxacin (LEVAQUIN) IVPB 750 mg  Status:  Discontinued        750 mg 100 mL/hr over 90 Minutes Intravenous Daily at bedtime 01/15/12 2334 01/16/12 1650   01/15/12 1000   moxifloxacin (AVELOX) IVPB 400 mg  Status:  Discontinued        400 mg 250 mL/hr over 60 Minutes Intravenous Every 24 hours 01/14/12 1706 01/15/12 2334   01/15/12 0000   moxifloxacin (AVELOX) IVPB 400 mg  Status:  Discontinued        400 mg 250 mL/hr over 60 Minutes Intravenous Every 24 hours 01/14/12 1640 01/14/12 1706   01/14/12 1530   cefTRIAXone (ROCEPHIN) 1 g in dextrose 5 % 50 mL IVPB        1 g 100 mL/hr over 30 Minutes Intravenous  Once 01/14/12 1523 01/14/12 1645   01/14/12 1530   azithromycin (ZITHROMAX) 500 mg in dextrose 5 % 250 mL IVPB  Status:  Discontinued        500 mg 250 mL/hr over 60 Minutes Intravenous  Once 01/14/12 1523 01/14/12 1527   01/14/12 1530   azithromycin (ZITHROMAX) tablet 500 mg        500 mg Oral  Once 01/14/12 1527 01/14/12 1603  A:  Acute tracheobronchitis in setting of COPD, no clear pneumonia (no fever, elevated WBC) and 2 x PCT normal P:   - change abx to avelox given negative MRSA PCR and negative culture and PCT x 3 and  dc antibioptic on 01/19/12 (total 5 days)  ENDOCRINE  Lab 01/18/12 0846 01/18/12 0355 01/17/12 2328 01/17/12 1939 01/17/12 1526  GLUCAP 210* 185* 219* 185* 200*   A:  DM2   P:   -SSI -add regular q4 hour insulin while npo on bipap -will likely need lantus when taking po -hold metformin (bad idea in setting of chronic hypoxemia and ckd)  NEUROLOGIC  A:  Baseline valium use.  On 6/3 Acute encephalopathy due to hypercarbia, On 6/4 deliriious again ? Due to hypercarbia v lack of  benzo v both -> intubatd On 6/4 - on cont sedation Normal WUA On 6/6: Agitated on WUA  P:   -fent gtt to continure Change versed to precedex 01/19/12  BEST PRACTICE / DISPOSITION Level of Care:  ICU. Place PICC 01/16/12 Primary Service:  PCCM Consultants:   Code Status:  NO CPR, NO ACLS CODE, NO DEFIB BUT YES FOR INTUBATION AND FULL MEDICAL CARE AS OF 01/16/12 Diet:  npo DVT Px:  scd GI Px:  ppi Skin Integrity:  Normal per rn Social / Family:  15 MINUTE  bedside conference room conversation with sister Gigi Gin and Wife Corrie Dandy  Family: large extended family. Married to wife for 20 years or more. No biological kids. Wife main Management consultant.    Patient as person: spiritiual to himself. Loves people, family, golf and doing errands. Appears to have lost purpose because of health issues and unable to do things he enjoyes for the last few years. Quality of life is important. Stoic and did not complain about the things he misses to his wife but wife could sense. Been intubated during CABG 20 years ago and maybe once for AECOPD several years abo. BUt for unclear reasons, petrified about intubation. SAw intubatino as a terminal event in his illness. Wife never explored the reasons why. Patient did feel emotively that his life expectancy is limited  Family: large extended family. Married to wife for 20 years or more. No biological kids. Wife always rejected his fear of intubation and mechanical ventilation but never questioned his rationale. Wife feels better to intubate to give him the opportunity to get better and go home but agrees quality of life is important. CPR discussed and given poor outcomes with procedure, she prefers he should not have CPR   The patient is critically ill with multiple organ systems failure and requires high complexity decision making for assessment and support, frequent evaluation and titration of therapies, application of advanced monitoring technologies and extensive  interpretation of multiple databases.   Critical Care Time devoted to patient care services described in this note is  35  Minutes.  Dr. Kalman Shan, M.D., [facility].C.P Pulmonary and Critical Care Medicine Staff Physician [facility] [place] [facility] Pager: [phone], If no answer or between  15:00h - 7:00h: call [phone]  01/18/2012 10:04 AM

## 2012-01-18 NOTE — Progress Notes (Signed)
Pt remains bradycardic, now sustaining in the high 40's. BP 135/66 O2 sats 100%. MD Sood called to make aware, no new orders. Will continue to monitor.   [name] [name]

## 2012-01-18 NOTE — Progress Notes (Signed)
CSW spoke with [facility] and note plan for d/c is for home with home health at this time. CSW signing off as there are no identified social work needs at present. Please reconsult if needed for disposition planning.  Baxter Flattery, MSW [phone]

## 2012-01-18 NOTE — Progress Notes (Signed)
eLink Physician-Brief Progress Note Patient Name: [name] [name] DOB: [birth year] MRN: [id number]  Date of Service  01/18/2012   HPI/Events of Note  Hypotension with BP 84/40 (55).  Ongoing diuresis for CHF   eICU Interventions  Plan: 1 time order for 25% albumin IV for BP support to minimize fluid adminstration   Intervention Category Intermediate Interventions: Hypotension - evaluation and management;Hypovolemia - evaluation and management  Zaynah Chawla 01/18/2012, 12:54 AM

## 2012-01-18 NOTE — Progress Notes (Signed)
Pt has become more bradycardic since initiation of Precedex (HR 49-52). MD Sood called to make aware. Pt's BP is stable at 134/64 with O2 sats 99% on current vent settings, and pt denies pain. No new orders at this time, will monitor  Carey Bullocks, Jorge Ny

## 2012-01-18 NOTE — Progress Notes (Signed)
Notified Dr. Darrick Penna of hypotension. Sedation cut in half 25 minutes prior to calling MD. Systolic BP remaining in the 80s with MAP in the 40=50s. CVP currently reading 3. Lasix dose given at 2200 and patient put out approximately 500cc. Order given to treat with one Albumin. Will continue to monitor BP.

## 2012-01-19 DIAGNOSIS — N179 Acute kidney failure, unspecified: Secondary | ICD-10-CM | POA: Diagnosis present

## 2012-01-19 DIAGNOSIS — N189 Chronic kidney disease, unspecified: Secondary | ICD-10-CM | POA: Diagnosis present

## 2012-01-19 LAB — GLUCOSE, CAPILLARY
Glucose-Capillary: 277 mg/dL — ABNORMAL HIGH (ref 70–99)
Glucose-Capillary: 146 mg/dL — ABNORMAL HIGH (ref 70–99)

## 2012-01-19 LAB — POCT I-STAT 3, ART BLOOD GAS (G3+)
Acid-Base Excess: 7 mmol/L — ABNORMAL HIGH (ref 0.0–2.0)
O2 Saturation: 95 %
Patient temperature: 97.8

## 2012-01-19 MED ORDER — INSULIN ASPART 100 UNIT/ML ~~LOC~~ SOLN
0.0000 [IU] | SUBCUTANEOUS | Status: DC
  Administered 2012-01-19: 3 [IU] via SUBCUTANEOUS
  Administered 2012-01-19: 11 [IU] via SUBCUTANEOUS
  Administered 2012-01-19 – 2012-01-20 (×3): 2 [IU] via SUBCUTANEOUS
  Administered 2012-01-20 (×2): 3 [IU] via SUBCUTANEOUS
  Administered 2012-01-20: 5 [IU] via SUBCUTANEOUS
  Administered 2012-01-20 – 2012-01-21 (×3): 2 [IU] via SUBCUTANEOUS

## 2012-01-19 MED ORDER — PREDNISONE 20 MG PO TABS
40.0000 mg | ORAL_TABLET | Freq: Every day | ORAL | Status: DC
  Filled 2012-01-19 (×2): qty 2

## 2012-01-19 MED ORDER — MOXIFLOXACIN HCL 400 MG PO TABS
400.0000 mg | ORAL_TABLET | Freq: Every day | ORAL | Status: AC
  Administered 2012-01-19 – 2012-01-20 (×2): 400 mg via ORAL
  Filled 2012-01-19 (×2): qty 1

## 2012-01-19 NOTE — Progress Notes (Signed)
Inpatient Diabetes Program Recommendations  AACE/ADA: New Consensus Statement on Inpatient Glycemic Control (2009)  Target Ranges:  Prepandial:   less than 140 mg/dL      Peak postprandial:   less than 180 mg/dL (1-2 hours)      Critically ill patients:  140 - 180 mg/dL   Reason for Visit: Hyperglycemia  Results for [name] [name], [name] [name] (MRN [id number]) as of 01/19/2012 09:13  Ref. Range 01/18/2012 23:11 01/19/2012 05:32 01/19/2012 07:50  Glucose-Capillary Latest Range: 70-99 mg/dL 409 (H) 811 (H) 914 (H)    Inpatient Diabetes Program Recommendations Correction (SSI): Add Novolog sensitive Q4 hours  Note: Will continue to follow.

## 2012-01-19 NOTE — Progress Notes (Addendum)
Name: [name] [name] MRN: [id number] DOB: Jul 26, 1951    LOS: 5  Referring Provider:  Carmell Austria Reason for Referral:  Hypercapnic respiratory failure  LOS 5 days Date of admit: 01/14/2012 12:37 PM\  PULMONARY / CRITICAL CARE MEDICINE  BRIEF:  This is a 61 y/o male with COPD, Pulmonary Hypertension, Obesity Hypoventilation syndrome, Diastolic heart failure, CAD s/p CABG, GERD and CKD who was admitted on 01/14/12 with shortness of breath, cough productive of green sputum for three days.  He was admitted to [facility] with a plan to gently diurese and treat possible pneumonia with avelox.  He became more lethargic during 6/3 and an ABG noted hypercapnic respiratory failure.  PCCM was consulted  01/15/12 for further management.  His wife notes that he has not been able to use his BIPAP machine prior to admission because of shortness of breath.   Events Since Admission: 6/4 - agitated and intubated 6/5 - failed wua and sbt 6/6 - started precedex, dc versed  SUBJECTIVE/OVERNIGHT/INTERVAL HX  REsolved delirium on precedex - WUA - normal, On SBT: wants et tube out  Had bradycardia with precedex but maintained bP  Vital Signs: Temp:  [97.2 F (36.2 C)-98.3 F (36.8 C)] 97.2 F (36.2 C) (06/07 0754) Pulse Rate:  [48-74] 58  (06/07 0900) Resp:  [4-19] 19  (06/07 0900) BP: (95-176)/(43-79) 101/51 mmHg (06/07 0900) SpO2:  [95 %-100 %] 95 % (06/07 0900) Arterial Line BP: (97-176)/(42-79) 106/48 mmHg (06/07 0900) FiO2 (%):  [30 %] 30 % (06/07 0800) Weight:  [120.5 kg (265 lb 10.5 oz)] 120.5 kg (265 lb 10.5 oz) (06/07 0400)  Physical Examination: Gen: chronically and critically  ill appearing obese male on ventilator HEENT: NCAT, PERRL, BIPAP in place PULM: On ventilator. No wheeze.  RR 12 and no breath stacking CV: RRR, no mgr, difficult to assess JVD AB: BS+, soft, nontender, no hsm Ext: warm, pitting edema noted, no clubbing, no cyanosis Derm: no rash or skin breakdown Neuro: RASS 0. CAM-ICU  negative for delirium  (off sedation gtt). Moves all 4s  Active Problems:  CORONARY ARTERY DISEASE, CABG 2001, last PCI 7/11  DIASTOLIC DYSFUNCTION  C O P D  RESPIRATORY FAILURE, CHRONIC, on BiPap  Diabetes mellitus  Acute respiratory failure with hypoxia  Encephalopathy acute  Acute respiratory failure  Metabolic alkalosis  Respiratory acidosis   ASSESSMENT AND PLAN  PULMONARY  Lab 01/17/12 0418 01/17/12 0016 01/16/12 1531 01/16/12 0908 01/16/12 0448  PHART 7.647* 7.613* 7.409 7.385 7.312*  PCO2ART 46.2* 50.1* 82.0* 83.5* 100.5*  PO2ART 210.0* 260.0* 63.0* 66.0* 121.0*  HCO3 50.6* 50.8* 51.8* 50.4* 50.9*  O2SAT 100.0 100.0 90.0 92.0 98.0   ABG 6/7 on SBT - 03/11/69  Ventilator Settings: Vent Mode:  [-] PSV FiO2 (%):  [30 %] 30 % Set Rate:  [10 bmp] 10 bmp Vt Set:  [500 mL] 500 mL PEEP:  [5 cmH20] 5 cmH20 Pressure Support:  [10 cmH20] 10 cmH20 Plateau Pressure:  [21 cmH20-22 cmH20] 21 cmH20 CXR:   Dg Chest Port 1 View  01/18/2012  *RADIOLOGY REPORT*  Clinical Data: Respiratory failure  PORTABLE CHEST - 1 VIEW  Comparison: 01/16/2012  Findings: Status post CABG again noted.  The cardiomediastinal silhouette is stable.  Endotracheal tube in place with tip 2.6 cm above the carina.  NG tube in place.  Stable right IJ central line position.  There is  residual mild interstitial prominence with slight improvement in aeration.  No convincing pulmonary edema. Probable small left pleural effusion with  left basilar atelectasis or infiltrate.  IMPRESSION: Stable support apparatus.  Residual mild interstitial prominence with slight improvement in aeration.  No convincing pulmonary edema.  Probable small left pleural effusion with left basilar atelectasis or infiltrate.  Original Report Authenticated By: Natasha Mead, M.D.     ETT:  n/a  A:  Acute on chronic respiratory failure, due to volume overload in setting of cor pulmonale and obesity hypoventilation syndrome and AECOPD;  -on 6/3   improving ABG and mental status after one hour of bipap  - on 6/4: got worse again with agitation intubated - on 6/7 - likely will meet extubation criteria  P:   Extubate depending on progress with SBT -> addendum will not extubate due to resp acidosis on SBT Continue nebs Change solumedrol to po prednisone on 01/19/12   CARDIOVASCULAR  Lab 01/18/12 0400 01/15/12 2015 01/15/12 0901 01/15/12 0027 01/14/12 1645 01/14/12 1405  TROPONINI -- <0.30 <0.30 <0.30 <0.30 --  LATICACIDVEN -- -- -- -- -- --  PROBNP 423.2* 443.8* -- -- -- 358.6*   ECG:  NSR, RBBB, likely RVH Lines:  A: Acute volume overload in setting of known diastolic dysfunction and pulmonary hypertension; has had increase swelling, weight gain, and CXR consistent with pulmonary edema; diuretic dose had recently been decreased by nephrologist as outpatient (per admission H&P) - on 6/6: did not tolerate lasix (dropped bp and rising creat) - oon 6/7 - maintained bp but had bradycardia due to precedex  P:  - monitor off lasix, check bnp 6/8 -continue IMDUR, low dose metop -tele -address home lasix dosing prior to discharge  A: PVD, no active ischaemia P: -continue plavix  RENAL  Lab 01/18/12 0400 01/16/12 0440 01/15/12 2015 01/15/12 0605 01/14/12 1405  NA 145 145 141 146* 146*  K 3.9 4.9 -- -- --  CL 95* 95* 92* 95* 98  CO2 38* 43* 43* >45* >45*  BUN 87* 51* 51* 44* 47*  CREATININE 2.58* 1.83* 1.97* 1.66* 1.73*  CALCIUM 9.6 9.6 9.5 9.8 9.8  MG 2.3 -- -- -- --  PHOS 4.4 -- -- -- --   Intake/Output      06/06 0701 - 06/07 0700 06/07 0701 - 06/08 0700   I.V. (mL/kg) 591.5 (4.9) 44.1 (0.4)   NG/GT 990 55   IV Piggyback 276.5    Total Intake(mL/kg) 1858 (15.4) 99.1 (0.8)   Urine (mL/kg/hr) 2825 (1) 175   Emesis/NG output 0    Total Output 2825 175   Net -967 -75.9         Foley:  6/3  A:  Acute on chronic renal failure improved with diuresis  -on 6/6 worse after increased diuresis on 6/5 -on 01/19/12 creatinine  even worse but making urine  P: -continue to hold lasix since 6/6 -foley -renal dose meds (d/w pharmacist Shanda Bumps 01/19/12, for now colchicine dose oka but to halve dose if creat worsens) -monitor bmet daily  GASTROINTESTINAL  Lab 01/18/12 0400 01/15/12 0605  AST 9 14  ALT 5 8  ALKPHOS 53 65  BILITOT 0.2* 0.2*  PROT 7.0 7.6  ALBUMIN 3.3* 3.5    A:  GERD, No acute issues - on tube feeds since 01/17/12 P:   -home ppi - place og tube -  HEMATOLOGIC  Lab 01/18/12 0400 01/16/12 0440 01/15/12 2015 01/15/12 0605 01/14/12 1405  HGB 9.9* 9.8* 9.9* 9.9* 9.6*  HCT 33.8* 34.8* 35.7* 35.2* 33.9*  PLT 149* 123* 122* 129* 121*  INR -- -- -- -- --  APTT -- -- -- 31 --   A:  Anemia of critical illness P:   - - PRBC for hgb </= 6.9gm%    - exceptions are   -  if ACS susepcted/confirmed then transfuse for hgb </= 8.0gm%,  or    -  If septic shock first 24h and scvo2 < 70% then transfuse for hgb </= 9.0gm%   - active bleeding with hemodynamic instability, then transfuse regardless of hemoglobin value   At at all times try to transfuse 1 unit prbc as possible with exception of active hemorrhage   -   INFECTIOUS  Lab 01/18/12 0400 01/16/12 0440 01/15/12 2015 01/15/12 0605 01/14/12 1405  WBC 8.8 4.4 4.2 4.3 5.9  PROCALCITON 0.11 -- 0.10 -- --   Cultures:  Results for orders placed during the hospital encounter of 01/14/12  MRSA PCR SCREENING     Status: Normal   Collection Time   01/15/12  7:59 PM      Component Value Range Status Comment   MRSA by PCR NEGATIVE  NEGATIVE  Final   CULTURE, EXPECTORATED SPUTUM-ASSESSMENT     Status: Normal   Collection Time   01/15/12  9:40 PM      Component Value Range Status Comment   Specimen Description SPUTUM   Final    Special Requests NONE   Final    Sputum evaluation     Final    Value: THIS SPECIMEN IS ACCEPTABLE. RESPIRATORY CULTURE REPORT TO FOLLOW.   Report Status 01/15/2012 FINAL   Final   CULTURE, RESPIRATORY     Status: Normal    Collection Time   01/15/12  9:40 PM      Component Value Range Status Comment   Specimen Description SPUTUM   Final    Special Requests NONE   Final    Gram Stain     Final    Value: RARE WBC PRESENT,BOTH PMN AND MONONUCLEAR     NO SQUAMOUS EPITHELIAL CELLS SEEN     NO ORGANISMS SEEN   Culture NORMAL OROPHARYNGEAL FLORA   Final    Report Status 01/18/2012 FINAL   Final      Antibiotics:  Anti-infectives     Start     Dose/Rate Route Frequency Ordered Stop   01/19/12 0600   vancomycin (VANCOCIN) 1,750 mg in sodium chloride 0.9 % 500 mL IVPB  Status:  Discontinued        1,750 mg 250 mL/hr over 120 Minutes Intravenous Every 24 hours 01/18/12 1008 01/18/12 1022   01/18/12 1100   moxifloxacin (AVELOX) IVPB 400 mg        400 mg 250 mL/hr over 60 Minutes Intravenous Every 24 hours 01/18/12 1020 01/20/12 1059   01/17/12 0600   vancomycin (VANCOCIN) 1,250 mg in sodium chloride 0.9 % 250 mL IVPB  Status:  Discontinued        1,250 mg 166.7 mL/hr over 90 Minutes Intravenous Every 12 hours 01/16/12 1709 01/18/12 1008   01/16/12 1800   piperacillin-tazobactam (ZOSYN) IVPB 3.375 g  Status:  Discontinued        3.375 g 12.5 mL/hr over 240 Minutes Intravenous Every 8 hours 01/16/12 1709 01/18/12 1022   01/16/12 1715   vancomycin (VANCOCIN) 2,500 mg in sodium chloride 0.9 % 500 mL IVPB        2,500 mg 250 mL/hr over 120 Minutes Intravenous  Once 01/16/12 1709 01/16/12 2021   01/16/12 0100   levofloxacin (LEVAQUIN) IVPB 750 mg  Status:  Discontinued        750 mg 100 mL/hr over 90 Minutes Intravenous Daily at bedtime 01/15/12 2334 01/16/12 1650   01/15/12 1000   moxifloxacin (AVELOX) IVPB 400 mg  Status:  Discontinued        400 mg 250 mL/hr over 60 Minutes Intravenous Every 24 hours 01/14/12 1706 01/15/12 2334   01/15/12 0000   moxifloxacin (AVELOX) IVPB 400 mg  Status:  Discontinued        400 mg 250 mL/hr over 60 Minutes Intravenous Every 24 hours 01/14/12 1640 01/14/12 1706    01/14/12 1530   cefTRIAXone (ROCEPHIN) 1 g in dextrose 5 % 50 mL IVPB        1 g 100 mL/hr over 30 Minutes Intravenous  Once 01/14/12 1523 01/14/12 1645   01/14/12 1530   azithromycin (ZITHROMAX) 500 mg in dextrose 5 % 250 mL IVPB  Status:  Discontinued        500 mg 250 mL/hr over 60 Minutes Intravenous  Once 01/14/12 1523 01/14/12 1527   01/14/12 1530   azithromycin (ZITHROMAX) tablet 500 mg        500 mg Oral  Once 01/14/12 1527 01/14/12 1603           A:  Acute tracheobronchitis in setting of COPD, no clear pneumonia (no fever, elevated WBC) and 2 x PCT normal P:   - change abx to avelox given negative MRSA PCR and negative culture and PCT x 3 and  dc antibioptic on 01/20/12 (total 5 days)  ENDOCRINE  Lab 01/19/12 0750 01/19/12 0532 01/18/12 2311 01/18/12 1929 01/18/12 1545  GLUCAP 283* 277* 239* 167* 183*   A:  DM2   P:   -SSI -hold metformin (avoid in setting of chronic hypoxemia and ckd)  NEUROLOGIC  A:  Baseline valium use.  On 6/3 Acute encephalopathy due to hypercarbia, On 6/4 deliriious again ? Due to hypercarbia v lack of benzo v both -> intubatd On 6/4 - on cont sedation Normal WUA On 6/6: Agitated on WUA -> versed changed to precedex On 01/19/12: nromal WUA  P:   - use fentanyl prn - Change versed to precedex 01/19/12  BEST PRACTICE / DISPOSITION Level of Care:  ICU. Place PICC 01/16/12 Primary Service:  PCCM Consultants:   Code Status:  As of 01/16/12  - he is DNAR but full medical care. Dr MR d/w patient 6/7 - he is ok for reintubation if extubated Diet:  Tube feeds since 6/5 DVT Px:  scd GI Px:  ppi Skin Integrity:  Normal per rn Social / Family:  Wife updated daily including 6/4 (detailed goals of care wit wife, see bottom of note), 6/5, 6/6 and 6/7  The patient is critically ill with multiple organ systems failure and requires high complexity decision making for assessment and support, frequent evaluation and titration of therapies, application of  advanced monitoring technologies and extensive interpretation of multiple databases.   Critical Care Time devoted to patient care services described in this note is  35  Minutes.  Dr. Kalman Shan, M.D., [facility].C.P Pulmonary and Critical Care Medicine Staff Physician [facility] [place] Pulmonary and Critical Care Pager: [phone], If no answer or between  15:00h - 7:00h: call [phone]  01/19/2012 9:41 AM     Detailed family meet 01/16/12   15 MINUTE  bedside conference room conversation with sister Gigi Gin and Wife Corrie Dandy on 01/16/12.  Family: large extended family. Married to wife for  20 years or more. No biological kids. Wife main Management consultant.   Patient as person: spiritiual to himself. Loves people, family, golf and doing errands. Appears to have lost purpose because of health issues and unable to do things he enjoyes for the last few years. Quality of life is important. Stoic and did not complain about the things he misses to his wife but wife could sense. Been intubated during CABG 20 years ago and maybe once for AECOPD several years abo. BUt for unclear reasons, petrified about intubation. SAw intubatino as a terminal event in his illness. Wife never explored the reasons why. Patient did feel emotively that his life expectancy is limited rge extended family. Married to wife for 20 years or more. No biological kids. Wife always rejected his fear of intubation and mechanical ventilation but never questioned his rationale. Wife feels better to intubate to give him the opportunity to get better and go home but agrees quality of life is important. CPR discussed and given poor outcomes with procedure, she prefers he should not have CPR

## 2012-01-19 NOTE — Progress Notes (Addendum)
Nutrition Follow-up  Patient remains intubated with PRN sedation. EN regimen: Osmolite 1.5 formula at 25 ml/hr with Prostat liquid protein 30 ml 6 times daily via OGT providing 1500 total kcals, 128 gm protein, 457 ml of free water. Not ready for extubation due to respiratory acidosis on SBT.  Diet Order:  NPO  Meds: Scheduled Meds:   . albumin human  12.5 g Intravenous Once  . albuterol  2.5 mg Nebulization Q4H  . antiseptic oral rinse  15 mL Mouth Rinse q12n4p  . aspirin EC  81 mg Oral Daily  . atorvastatin  20 mg Oral q1800  . budesonide  0.25 mg Nebulization BID  . chlorhexidine  15 mL Mouth Rinse BID  . clopidogrel  75 mg Oral Daily  . colchicine  0.6 mg Oral Daily  . docusate  100 mg Oral BID  . feeding supplement (OSMOLITE 1.5 CAL)  1,000 mL Per Tube Q24H  . feeding supplement  30 mL Per Tube 6 X Daily  . insulin aspart  0-15 Units Subcutaneous Q4H  . insulin aspart  3 Units Subcutaneous Q4H  . ipratropium  0.5 mg Nebulization Q4H  . isosorbide mononitrate  45 mg Oral Daily  . metoprolol  5 mg Intravenous Q6H  . moxifloxacin  400 mg Oral Daily  . multivitamin  5 mL Per Tube Daily  . pantoprazole sodium  40 mg Per Tube Q1200  . predniSONE  40 mg Oral Q breakfast  . sodium chloride  3 mL Intravenous Q12H  . DISCONTD: methylPREDNISolone (SOLU-MEDROL) injection  60 mg Intravenous Q12H  . DISCONTD: moxifloxacin  400 mg Intravenous Q24H   Continuous Infusions:   . dexmedetomidine (PRECEDEX) IV infusion 0.3 mcg/kg/hr (01/19/12 1200)  . DISCONTD: fentaNYL infusion INTRAVENOUS 100 mcg/hr (01/18/12 0030)  . DISCONTD: midazolam (VERSED) infusion 2 mg/hr (01/18/12 0030)   PRN Meds:.albuterol, fentaNYL, nitroGLYCERIN, sodium chloride, DISCONTD: acetaminophen, DISCONTD: acetaminophen  Labs:  CMP     Component Value Date/Time   NA 145 01/18/2012 0400   K 3.9 01/18/2012 0400   CL 95* 01/18/2012 0400   CO2 38* 01/18/2012 0400   GLUCOSE 195* 01/18/2012 0400   BUN 87* 01/18/2012 0400   CREATININE 2.58* 01/18/2012 0400   CALCIUM 9.6 01/18/2012 0400   CALCIUM 8.5 12/10/2010 0400   PROT 7.0 01/18/2012 0400   ALBUMIN 3.3* 01/18/2012 0400   AST 9 01/18/2012 0400   ALT 5 01/18/2012 0400   ALKPHOS 53 01/18/2012 0400   BILITOT 0.2* 01/18/2012 0400   GFRNONAA 26* 01/18/2012 0400   GFRAA 30* 01/18/2012 0400     Intake/Output Summary (Last 24 hours) at 01/19/12 1243 Last data filed at 01/19/12 1200  Gross per 24 hour  Intake 1633.77 ml  Output   3110 ml  Net -1476.23 ml    CBG (last 3)   Basename 01/19/12 1128 01/19/12 0750 01/19/12 0532  GLUCAP 314* 283* 277*    Weight Status:  120.5 kg (6/7) -- up likely due to volume   Re-estimated needs:  2200 kcals, 130-140 gm protein  Nutrition Dx:  Inadequate Oral Intake, ongoing  Goal:  EN to provide 60-70% of estimated calorie needs (22-25 kcals/kg ideal body weight) and 100% of estimated protein needs, based on ASPEN guidelines for permissive underfeeding in critically ill obese individuals, met Monitor: EN regimen/tolerance, respiratory status, weight, labs, I/O's  Intervention:    Continue current EN regimen & MVI daily via tube  RD to follow for nutrition care plan  Alger Memos Pager #:  [phone]  

## 2012-01-20 DIAGNOSIS — G4730 Sleep apnea, unspecified: Secondary | ICD-10-CM

## 2012-01-20 LAB — BASIC METABOLIC PANEL
CO2: 37 mEq/L — ABNORMAL HIGH (ref 19–32)
Calcium: 9.2 mg/dL (ref 8.4–10.5)
Chloride: 108 mEq/L (ref 96–112)
Glucose, Bld: 218 mg/dL — ABNORMAL HIGH (ref 70–99)
Sodium: 153 mEq/L — ABNORMAL HIGH (ref 135–145)

## 2012-01-20 LAB — CBC
Hemoglobin: 10.6 g/dL — ABNORMAL LOW (ref 13.0–17.0)
MCH: 28 pg (ref 26.0–34.0)
MCV: 95.5 fL (ref 78.0–100.0)
Platelets: 127 10*3/uL — ABNORMAL LOW (ref 150–400)
RBC: 3.78 MIL/uL — ABNORMAL LOW (ref 4.22–5.81)
WBC: 7.2 10*3/uL (ref 4.0–10.5)

## 2012-01-20 LAB — GLUCOSE, CAPILLARY
Glucose-Capillary: 193 mg/dL — ABNORMAL HIGH (ref 70–99)
Glucose-Capillary: 192 mg/dL — ABNORMAL HIGH (ref 70–99)
Glucose-Capillary: 146 mg/dL — ABNORMAL HIGH (ref 70–99)

## 2012-01-20 LAB — PRO B NATRIURETIC PEPTIDE: Pro B Natriuretic peptide (BNP): 373.1 pg/mL — ABNORMAL HIGH (ref 0–125)

## 2012-01-20 MED ORDER — ALBUTEROL SULFATE (5 MG/ML) 0.5% IN NEBU: 2.5000 mg | INHALATION_SOLUTION | RESPIRATORY_TRACT | Status: DC | PRN

## 2012-01-20 MED ORDER — ALBUTEROL SULFATE (5 MG/ML) 0.5% IN NEBU
2.5000 mg | INHALATION_SOLUTION | Freq: Four times a day (QID) | RESPIRATORY_TRACT | Status: DC
  Administered 2012-01-20 – 2012-01-21 (×4): 2.5 mg via RESPIRATORY_TRACT
  Filled 2012-01-20 (×4): qty 0.5

## 2012-01-20 MED ORDER — SODIUM CHLORIDE 0.9 % IV SOLN
INTRAVENOUS | Status: DC
  Administered 2012-01-20: 05:00:00 via INTRAVENOUS

## 2012-01-20 MED ORDER — MIDAZOLAM HCL 2 MG/2ML IJ SOLN
INTRAMUSCULAR | Status: AC
  Administered 2012-01-20: 08:00:00
  Filled 2012-01-20: qty 2

## 2012-01-20 MED ORDER — HEPARIN SODIUM (PORCINE) 5000 UNIT/ML IJ SOLN
5000.0000 [IU] | Freq: Three times a day (TID) | INTRAMUSCULAR | Status: DC
  Administered 2012-01-20 – 2012-01-23 (×9): 5000 [IU] via SUBCUTANEOUS
  Filled 2012-01-20 (×12): qty 1

## 2012-01-20 MED ORDER — PREDNISONE 20 MG PO TABS
30.0000 mg | ORAL_TABLET | Freq: Every day | ORAL | Status: DC
  Administered 2012-01-21 – 2012-01-23 (×3): 30 mg via ORAL
  Filled 2012-01-20 (×4): qty 1

## 2012-01-20 MED ORDER — METOPROLOL TARTRATE 12.5 MG HALF TABLET
12.5000 mg | ORAL_TABLET | Freq: Two times a day (BID) | ORAL | Status: DC
  Administered 2012-01-20 – 2012-01-21 (×2): 12.5 mg via ORAL
  Filled 2012-01-20 (×5): qty 1

## 2012-01-20 MED ORDER — ADULT MULTIVITAMIN W/MINERALS CH
1.0000 | ORAL_TABLET | Freq: Every day | ORAL | Status: DC
  Administered 2012-01-21 – 2012-01-23 (×3): 1 via ORAL
  Filled 2012-01-20 (×3): qty 1

## 2012-01-20 MED ORDER — DOCUSATE SODIUM 100 MG PO CAPS: 100.0000 mg | ORAL_CAPSULE | Freq: Two times a day (BID) | ORAL | Status: DC

## 2012-01-20 MED ORDER — DEXTROSE 5 % IV SOLN: INTRAVENOUS | Status: DC

## 2012-01-20 MED ORDER — DEXTROSE 5 % IV SOLN
INTRAVENOUS | Status: DC
  Administered 2012-01-20: 1000 mL via INTRAVENOUS
  Administered 2012-01-21: 22:00:00 via INTRAVENOUS

## 2012-01-20 MED ORDER — PANTOPRAZOLE SODIUM 40 MG PO TBEC: 40.0000 mg | DELAYED_RELEASE_TABLET | Freq: Every day | ORAL | Status: DC

## 2012-01-20 MED ORDER — SODIUM CHLORIDE 0.9 % IV SOLN
INTRAVENOUS | Status: DC
  Administered 2012-01-20: 500 mL via INTRAVENOUS

## 2012-01-20 MED ORDER — TAMSULOSIN HCL 0.4 MG PO CAPS
0.4000 mg | ORAL_CAPSULE | Freq: Every day | ORAL | Status: DC
  Administered 2012-01-20 – 2012-01-23 (×4): 0.4 mg via ORAL
  Filled 2012-01-20 (×4): qty 1

## 2012-01-20 MED ORDER — IPRATROPIUM BROMIDE 0.02 % IN SOLN
0.5000 mg | Freq: Four times a day (QID) | RESPIRATORY_TRACT | Status: DC
  Administered 2012-01-20 – 2012-01-21 (×4): 0.5 mg via RESPIRATORY_TRACT
  Filled 2012-01-20 (×4): qty 2.5

## 2012-01-20 MED ORDER — BUDESONIDE 0.25 MG/2ML IN SUSP
0.2500 mg | Freq: Two times a day (BID) | RESPIRATORY_TRACT | Status: DC
  Administered 2012-01-20 – 2012-01-21 (×2): 0.25 mg via RESPIRATORY_TRACT
  Filled 2012-01-20 (×3): qty 2

## 2012-01-20 NOTE — Procedures (Signed)
Extubation Procedure Note  Patient Details:   Name: [name] [name] DOB: Dec 15, 1950 MRN: [id number]   Airway Documentation:   Pt extubated to 4L [place] per MD order at 0915. No stridor noted. BBS equal and dim. Pt able to vocalize name. Strong productive cough. Positive cuff leak.  Evaluation  O2 sats: stable throughout and currently acceptable Complications: No apparent complications Patient did tolerate procedure well. Bilateral Breath Sounds: Diminished Suctioning: Airway   Christie Beckers 01/20/2012, 9:15 AM

## 2012-01-20 NOTE — Progress Notes (Signed)
Name: BRAIAN TIJERINA MRN: [id number] DOB: Sep 16, 1950    LOS: 6  Referring Provider:  Carmell Austria Reason for Referral:  Hypercapnic respiratory failure  LOS 6 days Date of admit: 01/14/2012 12:37 PM\  PULMONARY / CRITICAL CARE MEDICINE  BRIEF:  61 yo male former smoker admitted by [facility] on 01/14/2012 with progressive cough, purulent sputum, and dyspnea for 3 days.  Developed hypercapnic respiratory failure and metabolic encephalopathy on 6/03, and PCCM consulted.   PMHx COPD, OSA/OHS, CAD s/p CABG, CKD, Diastolic CHF, GERD  Events Since Admission: 6/4 - agitated and intubated 6/5 - failed wua and sbt 6/6 - started precedex, dc versed  SUBJECTIVE/OVERNIGHT/INTERVAL HX Tolerating SBT.  Follows commands on WUA.  Vital Signs: Temp:  [97.3 F (36.3 C)-98 F (36.7 C)] 98 F (36.7 C) (06/08 0400) Pulse Rate:  [53-64] 54  (06/08 0730) Resp:  [6-25] 18  (06/08 0730) BP: (88-128)/(49-75) 128/52 mmHg (06/08 0730) SpO2:  [95 %-100 %] 100 % (06/08 0730) Arterial Line BP: (100-125)/(42-56) 122/48 mmHg (06/08 0600) FiO2 (%):  [30 %] 30 % (06/08 0730) Weight:  [266 lb 8.6 oz (120.9 kg)] 266 lb 8.6 oz (120.9 kg) (06/08 0500)   Intake/Output Summary (Last 24 hours) at 01/20/12 0841 Last data filed at 01/20/12 0600  Gross per 24 hour  Intake   1368 ml  Output   1825 ml  Net   -457 ml    Vent Mode:  [-] PSV FiO2 (%):  [30 %] 30 % Set Rate:  [10 bmp] 10 bmp Vt Set:  [500 mL] 500 mL PEEP:  [5 cmH20] 5 cmH20 Pressure Support:  [5 cmH20] 5 cmH20 Plateau Pressure:  [20 cmH20-25 cmH20] 25 cmH20   Physical Examination: Gen: No distress HEENT: ETT, OG tube in place PULM: No wheeze CV: s1s2 regular, no murmur AB: BS+, soft, nontender Ext: warm, no edema Neuro: Follows commands, moves all extremities  No results found.  CBC    Component Value Date/Time   WBC 7.2 01/20/2012 0435   RBC 3.78* 01/20/2012 0435   HGB 10.6* 01/20/2012 0435   HCT 36.1* 01/20/2012 0435   PLT 127* 01/20/2012 0435   MCV 95.5 01/20/2012 0435   MCH 28.0 01/20/2012 0435   MCHC 29.4* 01/20/2012 0435   RDW 13.8 01/20/2012 0435   LYMPHSABS 1.0 01/14/2012 1405   MONOABS 0.9 01/14/2012 1405   EOSABS 0.1 01/14/2012 1405   BASOSABS 0.0 01/14/2012 1405    BMET    Component Value Date/Time   NA 153* 01/20/2012 0435   K 3.3* 01/20/2012 0435   CL 108 01/20/2012 0435   CO2 37* 01/20/2012 0435   GLUCOSE 218* 01/20/2012 0435   BUN 115* 01/20/2012 0435   CREATININE 2.28* 01/20/2012 0435   CALCIUM 9.2 01/20/2012 0435   CALCIUM 8.5 12/10/2010 0400   GFRNONAA 30* 01/20/2012 0435   GFRAA 34* 01/20/2012 0435    Lab Results  Component Value Date   ALT 5 01/18/2012   AST 9 01/18/2012   ALKPHOS 53 01/18/2012   BILITOT 0.2* 01/18/2012   ABG    Component Value Date/Time   PHART 7.299* 01/19/2012 1005   PCO2ART 72.2* 01/19/2012 1005   PO2ART 84.0 01/19/2012 1005   HCO3 35.6* 01/19/2012 1005   TCO2 38 01/19/2012 1005   O2SAT 95.0 01/19/2012 1005   Active Problems:  CORONARY ARTERY DISEASE, CABG 2001, last PCI 7/11  DIASTOLIC DYSFUNCTION  C O P D  RESPIRATORY FAILURE, CHRONIC, on BiPap  Diabetes mellitus  Acute respiratory failure with  hypoxia  Encephalopathy acute  Acute respiratory failure  Metabolic alkalosis  Respiratory acidosis  Acute on chronic renal failure   ASSESSMENT AND PLAN  PULMONARY  ETT:  6/04>>6/08  A:  Acute on chronic hypoxic/hypercapnic respiratory failure in setting of AECOPD, OSA/OHS. P:   Proceed with extubation 6/08 BPAP qhs and prn during day Titrate oxygen to keep SpO2 > 90% Bronchial hygiene Change prednisone to 30 mg daily on 6/09 Limit ABG draws Change nebulizer tx to q6h and q2h prn>>likely can resume outpt spiriva, advair soon  CARDIOVASCULAR  Lines: Rt radial aline 6/04>> Rt IJ CVL 6/04>>  A: Acute volume overload related to cor pulmonale.  Improved P: Keep in even fluid balance Hold lasix 6/08 due to hypernatremia  A: Hx of CAD, HTN, chronic diastolic dysfunction, hyperlipidemia, PVD P:  Continue  ASA, lipitor, plavix, imdur, metoprolol  RENAL  Foley:  6/3>>  A:  Chronic kidney disease (Baseline creatinine 1.76 from December 2012) with acute renal insufficiency.  Improving. P: Hold lasix F/u renal function Keep foley in to accurately assess I/O  A: Hypernatremia secondary to diuresis and insensible losses. P: Change IV fluid to D5W F/u BMET  A: BPH P: Resume flomax   A: Hypokalemia. P:  F/u and replace as needed  GASTROINTESTINAL  A:  Hx of GERD P:   Continue protonix  A: Nutrition P: Advanced to CHO modified diet as tolerated after extubation  HEMATOLOGIC  A:  Anemia of critical illness P:  F/u CBC Transfuse for Hb < 7 Continue darbepoetin  INFECTIOUS  Cultures: Sputum 6/03>>negative  Antibiotics: Rocephin 6/02>>6/02 Zithromax 6/02>>6/02 Levaquin 6/03>>6/03 Avelox 6/03>>6/03 Zosyn 6/04>>6/06 Vancomycin 6/04>>6/05 Avelox 6/06>>  A:  Acute tracheobronchitis in setting of AECOPD.  No evidence for PNA. P:   D7/10 Abx, currently on avelox  ENDOCRINE  Lab 01/20/12 0747 01/20/12 0407 01/19/12 2332 01/19/12 1948 01/19/12 1544  GLUCAP 192* 193* 209* 146* 200*   A:  DM2   P:   -SSI -hold metformin (avoid in setting of chronic hypoxemia and ckd)  A: Gout P: Colchicine   NEUROLOGIC  A:  Acute metabolic encephalopathy 2nd to hypercapnia.  Baseline valium use.  Improved. P:   Wean off precedex after extubation Limit benzo/narcotic use   BEST PRACTICE / DISPOSITION Level of Care:  ICU Primary Service:  PCCM Consultants: None Code Status: Limited resuscitation>>no CPR/defibrillation.  Diet: Advance to CHO modified after extubation DVT Px: SQ heparin GI Px:  Protonix  Updated wife at bedside about plan.  Critical care time 35 minutes.  Coralyn Helling, MD [facility] Pulmonary/Critical Care 01/20/2012, 9:09 AM Pager:  [phone] After 3pm call: [phone]

## 2012-01-21 LAB — BASIC METABOLIC PANEL
BUN: 99 mg/dL — ABNORMAL HIGH (ref 6–23)
CO2: 33 mEq/L — ABNORMAL HIGH (ref 19–32)
Calcium: 9 mg/dL (ref 8.4–10.5)
Glucose, Bld: 151 mg/dL — ABNORMAL HIGH (ref 70–99)
Sodium: 151 mEq/L — ABNORMAL HIGH (ref 135–145)

## 2012-01-21 LAB — CBC
Hemoglobin: 10.9 g/dL — ABNORMAL LOW (ref 13.0–17.0)
MCH: 28.2 pg (ref 26.0–34.0)
MCV: 96.9 fL (ref 78.0–100.0)
RBC: 3.87 MIL/uL — ABNORMAL LOW (ref 4.22–5.81)

## 2012-01-21 LAB — GLUCOSE, CAPILLARY
Glucose-Capillary: 112 mg/dL — ABNORMAL HIGH (ref 70–99)
Glucose-Capillary: 137 mg/dL — ABNORMAL HIGH (ref 70–99)
Glucose-Capillary: 150 mg/dL — ABNORMAL HIGH (ref 70–99)
Glucose-Capillary: 184 mg/dL — ABNORMAL HIGH (ref 70–99)
Glucose-Capillary: 255 mg/dL — ABNORMAL HIGH (ref 70–99)

## 2012-01-21 MED ORDER — ALBUTEROL SULFATE (5 MG/ML) 0.5% IN NEBU
2.5000 mg | INHALATION_SOLUTION | Freq: Two times a day (BID) | RESPIRATORY_TRACT | Status: DC
  Administered 2012-01-21 – 2012-01-23 (×4): 2.5 mg via RESPIRATORY_TRACT
  Filled 2012-01-21 (×4): qty 0.5

## 2012-01-21 MED ORDER — METOPROLOL TARTRATE 25 MG PO TABS
25.0000 mg | ORAL_TABLET | Freq: Two times a day (BID) | ORAL | Status: DC
  Administered 2012-01-21 – 2012-01-23 (×4): 25 mg via ORAL
  Filled 2012-01-21 (×5): qty 1

## 2012-01-21 MED ORDER — POTASSIUM CHLORIDE CRYS ER 20 MEQ PO TBCR
40.0000 meq | EXTENDED_RELEASE_TABLET | Freq: Four times a day (QID) | ORAL | Status: AC
  Administered 2012-01-21 (×2): 40 meq via ORAL
  Filled 2012-01-21 (×2): qty 2

## 2012-01-21 MED ORDER — INSULIN ASPART 100 UNIT/ML ~~LOC~~ SOLN
0.0000 [IU] | Freq: Three times a day (TID) | SUBCUTANEOUS | Status: DC
  Administered 2012-01-21: 4 [IU] via SUBCUTANEOUS
  Administered 2012-01-21: 3 [IU] via SUBCUTANEOUS
  Administered 2012-01-21: 11 [IU] via SUBCUTANEOUS
  Administered 2012-01-22: 3 [IU] via SUBCUTANEOUS
  Administered 2012-01-22: 4 [IU] via SUBCUTANEOUS
  Administered 2012-01-22: 7 [IU] via SUBCUTANEOUS
  Administered 2012-01-23: 4 [IU] via SUBCUTANEOUS

## 2012-01-21 MED ORDER — TIOTROPIUM BROMIDE MONOHYDRATE 18 MCG IN CAPS
18.0000 ug | ORAL_CAPSULE | Freq: Every day | RESPIRATORY_TRACT | Status: DC
  Administered 2012-01-21 – 2012-01-23 (×3): 18 ug via RESPIRATORY_TRACT
  Filled 2012-01-21: qty 5

## 2012-01-21 MED ORDER — IPRATROPIUM BROMIDE 0.02 % IN SOLN: 0.5000 mg | RESPIRATORY_TRACT | Status: DC | PRN

## 2012-01-21 MED ORDER — FAMOTIDINE 20 MG PO TABS
20.0000 mg | ORAL_TABLET | Freq: Every day | ORAL | Status: DC
  Administered 2012-01-21 – 2012-01-22 (×2): 20 mg via ORAL
  Filled 2012-01-21 (×4): qty 1

## 2012-01-21 MED ORDER — ACETAMINOPHEN 325 MG PO TABS
650.0000 mg | ORAL_TABLET | Freq: Four times a day (QID) | ORAL | Status: DC | PRN
  Administered 2012-01-22: 650 mg via ORAL
  Filled 2012-01-21: qty 2

## 2012-01-21 MED ORDER — FLUTICASONE-SALMETEROL 250-50 MCG/DOSE IN AEPB
1.0000 | INHALATION_SPRAY | Freq: Two times a day (BID) | RESPIRATORY_TRACT | Status: DC
  Administered 2012-01-21 – 2012-01-23 (×5): 1 via RESPIRATORY_TRACT
  Filled 2012-01-21: qty 14

## 2012-01-21 NOTE — Progress Notes (Signed)
Name: [name] [name] MRN: [id number] DOB: 03/02/1951    LOS: 7  Referring Provider:  Carmell Austria Reason for Referral:  Hypercapnic respiratory failure  LOS 7 days Date of admit: 01/14/2012 12:37 PM\  PULMONARY / CRITICAL CARE MEDICINE  BRIEF:  61 yo male former smoker admitted by [facility] on 01/14/2012 with progressive cough, purulent sputum, and dyspnea for 3 days.  Developed hypercapnic respiratory failure and metabolic encephalopathy on 6/03, and PCCM consulted.   PMHx COPD, OSA/OHS, CAD s/p CABG, CKD, Diastolic CHF, GERD  Events Since Admission: 6/4 - agitated and intubated 6/5 - failed wua and sbt 6/6 - started precedex, dc versed  SUBJECTIVE/OVERNIGHT/INTERVAL HX Feels better.  Denies chest pain or dyspnea.  Tolerating diet.  Vital Signs: Temp:  [98.3 F (36.8 C)-98.7 F (37.1 C)] 98.7 F (37.1 C) (06/09 0747) Pulse Rate:  [56-133] 89  (06/09 0700) Resp:  [14-31] 22  (06/09 0700) BP: (84-129)/(47-88) 114/65 mmHg (06/09 0600) SpO2:  [88 %-100 %] 94 % (06/09 0749) Arterial Line BP: (105-143)/(47-72) 143/72 mmHg (06/08 1200) FiO2 (%):  [30 %] 30 % (06/08 0831) Weight:  [258 lb 9.6 oz (117.3 kg)] 258 lb 9.6 oz (117.3 kg) (06/09 0600)   Intake/Output Summary (Last 24 hours) at 01/21/12 0806 Last data filed at 01/21/12 0700  Gross per 24 hour  Intake   1340 ml  Output   2130 ml  Net   -790 ml    Vent Mode:  [-] Stand-by FiO2 (%):  [30 %] 30 %   Physical Examination: Gen: No distress, sitting in chair HEENT: no oral exudate PULM: No wheeze CV: s1s2 regular, no murmur AB: BS+, soft, nontender Ext: warm, no edema Neuro: Follows commands, moves all extremities  No results found.  CBC    Component Value Date/Time   WBC 7.6 01/21/2012 0413   RBC 3.87* 01/21/2012 0413   HGB 10.9* 01/21/2012 0413   HCT 37.5* 01/21/2012 0413   PLT 123* 01/21/2012 0413   MCV 96.9 01/21/2012 0413   MCH 28.2 01/21/2012 0413   MCHC 29.1* 01/21/2012 0413   RDW 14.1 01/21/2012 0413   LYMPHSABS 1.0  01/14/2012 1405   MONOABS 0.9 01/14/2012 1405   EOSABS 0.1 01/14/2012 1405   BASOSABS 0.0 01/14/2012 1405    BMET    Component Value Date/Time   NA 151* 01/21/2012 0413   K 3.0* 01/21/2012 0413   CL 108 01/21/2012 0413   CO2 33* 01/21/2012 0413   GLUCOSE 151* 01/21/2012 0413   BUN 99* 01/21/2012 0413   CREATININE 2.21* 01/21/2012 0413   CALCIUM 9.0 01/21/2012 0413   CALCIUM 8.5 12/10/2010 0400   GFRNONAA 31* 01/21/2012 0413   GFRAA 36* 01/21/2012 0413    Lab Results  Component Value Date   ALT 5 01/18/2012   AST 9 01/18/2012   ALKPHOS 53 01/18/2012   BILITOT 0.2* 01/18/2012   ABG    Component Value Date/Time   PHART 7.299* 01/19/2012 1005   PCO2ART 72.2* 01/19/2012 1005   PO2ART 84.0 01/19/2012 1005   HCO3 35.6* 01/19/2012 1005   TCO2 38 01/19/2012 1005   O2SAT 95.0 01/19/2012 1005   Active Problems:  CORONARY ARTERY DISEASE, CABG 2001, last PCI 7/11  DIASTOLIC DYSFUNCTION  C O P D  RESPIRATORY FAILURE, CHRONIC, on BiPap  Diabetes mellitus  Acute respiratory failure with hypoxia  Encephalopathy acute  Acute respiratory failure  Metabolic alkalosis  Respiratory acidosis  Acute on chronic renal failure   ASSESSMENT AND PLAN  PULMONARY  ETT:  6/04>>6/08  A:  Acute on chronic hypoxic/hypercapnic respiratory failure in setting of AECOPD, OSA/OHS. P:   BPAP qhs Titrate oxygen to keep SpO2 > 90% >> not needing oxygen during day on 6/09 Bronchial hygiene Changed prednisone to 30 mg daily on 6/09>>wean to off over one week Limit ABG draws Resume outpt spiriva, advair  CARDIOVASCULAR  Lines: Rt radial aline 6/04>>6/8 Rt IJ CVL 6/04>>  A: Acute volume overload related to cor pulmonale.  Improved P: Keep in even fluid balance Hold lasix 6/09 due to hypernatremia Place peripheral IV and d/c central line  A: Hx of CAD, HTN, chronic diastolic dysfunction, hyperlipidemia, PVD P:  Continue ASA, lipitor, plavix, imdur, metoprolol  RENAL  Foley:  6/3>>6/9  A:  Chronic kidney disease (Baseline  creatinine 1.76 from December 2012) with acute renal insufficiency.  Improving. P: Hold lasix F/u renal function D/c foley  A: Hypernatremia secondary to diuresis and insensible losses. P: Change IV fluid to D5W F/u BMET  A: BPH P: Continue flomax  A: Hypokalemia. P:  F/u and replace as needed  GASTROINTESTINAL  A:  Hx of GERD P:   Continue protonix  A: Loose bowel movements 6/09>>abd exam benign. P: D/c colace Change protonix to pepcid  HEMATOLOGIC  A:  Anemia of critical illness P:  F/u CBC Transfuse for Hb < 7 Continue darbepoetin  INFECTIOUS  Cultures: Sputum 6/03>>negative  Antibiotics: Rocephin 6/02>>6/02 Zithromax 6/02>>6/02 Levaquin 6/03>>6/03 Avelox 6/03>>6/03 Zosyn 6/04>>6/06 Vancomycin 6/04>>6/05 Avelox 6/06>>  A:  Acute tracheobronchitis in setting of AECOPD.  No evidence for PNA. P:   D8/10 Abx, currently on avelox  ENDOCRINE  Lab 01/21/12 0745 01/21/12 0414 01/21/12 0008 01/20/12 1956 01/20/12 1549  GLUCAP 130* 150* 137* 131* 146*   A:  DM2   P:   -SSI -hold metformin (avoid in setting of chronic hypoxemia and ckd)  A: Gout P: Colchicine   NEUROLOGIC  A:  Acute metabolic encephalopathy 2nd to hypercapnia.  Baseline valium use.  Improved. P:   Limit benzo/narcotic use   BEST PRACTICE / DISPOSITION Level of Care:  SDU Primary Service:  PCCM Consultants: None Code Status: Limited resuscitation>>no CPR/defibrillation.  Diet: CHO modified DVT Px: SQ heparin GI Px:  Pepcid   Coralyn Helling, MD [facility] Pulmonary/Critical Care 01/21/2012, 8:06 AM Pager:  [phone] After 3pm call: [phone]

## 2012-01-22 DIAGNOSIS — I739 Peripheral vascular disease, unspecified: Secondary | ICD-10-CM

## 2012-01-22 LAB — BASIC METABOLIC PANEL
BUN: 77 mg/dL — ABNORMAL HIGH (ref 6–23)
Chloride: 108 mEq/L (ref 96–112)
GFR calc Af Amer: 40 mL/min — ABNORMAL LOW (ref >90)
GFR calc non Af Amer: 34 mL/min — ABNORMAL LOW (ref >90)
Potassium: 3.6 mEq/L (ref 3.5–5.1)
Sodium: 146 mEq/L — ABNORMAL HIGH (ref 135–145)

## 2012-01-22 LAB — MAGNESIUM: Magnesium: 2.7 mg/dL — ABNORMAL HIGH (ref 1.5–2.5)

## 2012-01-22 LAB — GLUCOSE, CAPILLARY
Glucose-Capillary: 145 mg/dL — ABNORMAL HIGH (ref 70–99)
Glucose-Capillary: 213 mg/dL — ABNORMAL HIGH (ref 70–99)

## 2012-01-22 MED ORDER — DIPHENHYDRAMINE HCL 25 MG PO CAPS: 25.0000 mg | ORAL_CAPSULE | Freq: Every evening | ORAL | Status: DC | PRN

## 2012-01-22 MED ORDER — MENTHOL 3 MG MT LOZG
1.0000 | LOZENGE | Freq: Four times a day (QID) | OROMUCOSAL | Status: DC | PRN
  Administered 2012-01-22 – 2012-01-23 (×2): 3 mg via ORAL
  Filled 2012-01-22: qty 9

## 2012-01-22 NOTE — Progress Notes (Signed)
Transferred to [id number] via wheelchair--portable monitor on.  No changes.

## 2012-01-22 NOTE — Progress Notes (Signed)
Name: [name] [name] MRN: [id number] DOB: 1951/03/16    LOS: 8  Referring Provider:  Carmell Austria Reason for Referral:  Hypercapnic respiratory failure  LOS 8 days Date of admit: 01/14/2012 12:37 PM\  PULMONARY / CRITICAL CARE MEDICINE  BRIEF:  61 yo male former smoker admitted by [facility] on 01/14/2012 with progressive cough, purulent sputum, and dyspnea for 3 days.  Developed hypercapnic respiratory failure and metabolic encephalopathy on 6/03, and PCCM consulted.   PMHx COPD, OSA/OHS, CAD s/p CABG, CKD, Diastolic CHF, GERD  Events Since Admission: 6/4 - agitated and intubated 6/5 - failed wua and sbt 6/6 - started precedex, dc versed  SUBJECTIVE/OVERNIGHT/INTERVAL HX Feels better.  Denies chest pain or dyspnea.  Tolerating diet.  Vital Signs: Temp:  [97.6 F (36.4 C)-99 F (37.2 C)] 99 F (37.2 C) (06/10 0737) Pulse Rate:  [72-95] 72  (06/10 0800) Resp:  [17-20] 17  (06/10 0800) BP: (105-141)/(66-74) 135/70 mmHg (06/10 0800) SpO2:  [94 %-100 %] 94 % (06/10 0855) Weight:  [120.8 kg (266 lb 5.1 oz)] 120.8 kg (266 lb 5.1 oz) (06/10 0500)   Intake/Output Summary (Last 24 hours) at 01/22/12 1004 Last data filed at 01/22/12 0900  Gross per 24 hour  Intake   1580 ml  Output   2550 ml  Net   -970 ml        Physical Examination: Gen: No distress, sitting in chair HEENT: no oral exudate PULM: No wheeze CV: s1s2 regular, no murmur AB: BS+, soft, nontender Ext: warm, no edema Neuro: Follows commands, moves all extremities   Active Problems:  CORONARY ARTERY DISEASE, CABG 2001, last PCI 7/11  DIASTOLIC DYSFUNCTION  C O P D  RESPIRATORY FAILURE, CHRONIC, on BiPap  Diabetes mellitus  Acute respiratory failure with hypoxia  Encephalopathy acute  Acute respiratory failure  Metabolic alkalosis  Respiratory acidosis  Acute on chronic renal failure   ASSESSMENT AND PLAN  PULMONARY CXR: none ETT:  6/04>>6/08  A:  Acute on chronic hypoxic/hypercapnic respiratory  failure in setting of AECOPD, OSA/OHS. P:   BPAP qhs Titrate oxygen to keep SpO2 > 90% >> not needing oxygen during day on 6/09 Bronchial hygiene tfr to floor , non tele   CARDIOVASCULAR  Lines: Rt radial aline 6/04>>6/8 Rt IJ CVL 6/04>>6/9  A: Acute volume overload related to cor pulmonale.  Improved P: Keep in even fluid balance  A: Hx of CAD, HTN, chronic diastolic dysfunction, hyperlipidemia, PVD P:  Continue ASA, lipitor, plavix, imdur, metoprolol  RENAL  Lab 01/22/12 0400 01/21/12 0413 01/20/12 0435 01/18/12 0400 01/16/12 0440  NA 146* 151* 153* 145 145  K 3.6 3.0* -- -- --  CL 108 108 108 95* 95*  CO2 29 33* 37* 38* 43*  GLUCOSE 147* 151* 218* 195* 74  BUN 77* 99* 115* 87* 51*  CREATININE 2.03* 2.21* 2.28* 2.58* 1.83*  CALCIUM 8.9 9.0 9.2 9.6 9.6  MG 2.7* -- -- 2.3 --  PHOS -- -- -- 4.4 --    Foley:  6/3>>6/9  A:  Chronic kidney disease (Baseline creatinine 1.76 from December 2012) with acute renal insufficiency.  Improving. P: Hold lasix F/u renal function    A: Hypernatremia secondary to diuresis and insensible losses. Improved  P: D/c ivf Push po fluids F/u BMET  A: BPH P: Continue flomax  A: Hypokalemia. P:  F/u and replace as needed  GASTROINTESTINAL  A:  Hx of GERD P:   Continue protonix  A: Loose bowel movements 6/09>>abd exam benign.  Improved.   P: Cont pepcid  HEMATOLOGIC  Lab 01/21/12 0413 01/20/12 0435 01/18/12 0400  HGB 10.9* 10.6* 9.9*  HCT 37.5* 36.1* 33.8*  WBC 7.6 7.2 8.8  PLT 123* 127* 149*    A:  Anemia of critical illness P:  F/u CBC Transfuse for Hb < 7 Continue darbepoetin  INFECTIOUS  Lab 01/21/12 0413 01/20/12 0435 01/18/12 0400 01/16/12 0440 01/15/12 2015  WBC 7.6 7.2 8.8 4.4 4.2   Cultures: Sputum 6/03>>negative  Antibiotics: Rocephin 6/02>>6/02 Zithromax 6/02>>6/02 Levaquin 6/03>>6/03 Avelox 6/03>>6/03 Zosyn 6/04>>6/06 Vancomycin 6/04>>6/05 Avelox 6/06>>6/9  A:  Acute  tracheobronchitis in setting of AECOPD.  No evidence for PNA. P:   Off all ABX   ENDOCRINE  Lab 01/22/12 0732 01/21/12 2151 01/21/12 1537 01/21/12 1154 01/21/12 0745  GLUCAP 145* 112* 255* 184* 130*   A:  DM2   P:   -SSI -hold metformin (avoid in setting of chronic hypoxemia and ckd)  A: Gout P: Colchicine   NEUROLOGIC  A:  Acute metabolic encephalopathy 2nd to hypercapnia.  Baseline valium use.  Improved. P:   Limit benzo/narcotic use QHS benadryl prn sleep   BEST PRACTICE / DISPOSITION Level of Care:  Med -surg tfr 01/22/12 Primary Service:  PCCM Consultants: None Code Status: Limited resuscitation>>no CPR/defibrillation.  Diet: CHO modified DVT Px: SQ heparin GI Px:  Pepcid  Austin Miles  [phone]  Cell  [phone]  If no response or cell goes to voicemail, call beeper [phone]  01/22/2012, 10:04 AM

## 2012-01-23 ENCOUNTER — Other Ambulatory Visit (HOSPITAL_COMMUNITY): Payer: Self-pay | Admitting: *Deleted

## 2012-01-23 LAB — CBC
MCH: 28.8 pg (ref 26.0–34.0)
MCHC: 30.8 g/dL (ref 30.0–36.0)
Platelets: 172 10*3/uL (ref 150–400)
RDW: 14 % (ref 11.5–15.5)

## 2012-01-23 LAB — BASIC METABOLIC PANEL
BUN: 66 mg/dL — ABNORMAL HIGH (ref 6–23)
Calcium: 9.3 mg/dL (ref 8.4–10.5)
Creatinine, Ser: 2.02 mg/dL — ABNORMAL HIGH (ref 0.50–1.35)
GFR calc Af Amer: 40 mL/min — ABNORMAL LOW (ref >90)
GFR calc non Af Amer: 34 mL/min — ABNORMAL LOW (ref >90)
Glucose, Bld: 130 mg/dL — ABNORMAL HIGH (ref 70–99)
Potassium: 5 mEq/L (ref 3.5–5.1)

## 2012-01-23 LAB — GLUCOSE, CAPILLARY
Glucose-Capillary: 187 mg/dL — ABNORMAL HIGH (ref 70–99)
Glucose-Capillary: 168 mg/dL — ABNORMAL HIGH (ref 70–99)
Glucose-Capillary: 118 mg/dL — ABNORMAL HIGH (ref 70–99)

## 2012-01-23 MED ORDER — FUROSEMIDE 40 MG PO TABS: 40.0000 mg | ORAL_TABLET | Freq: Every day | ORAL | Status: DC

## 2012-01-23 MED ORDER — PREDNISONE 10 MG PO TABS: ORAL_TABLET | ORAL | Status: DC

## 2012-01-23 MED ORDER — TIOTROPIUM BROMIDE MONOHYDRATE 18 MCG IN CAPS: 18.0000 ug | ORAL_CAPSULE | Freq: Every day | RESPIRATORY_TRACT | Status: DC

## 2012-01-23 NOTE — Progress Notes (Signed)
Name: [name] [name] MRN: [id number] DOB: [birth year]    LOS: 9  Referring Provider:  Carmell Austria Reason for Referral:  Hypercapnic respiratory failure  LOS 9 days Date of admit: 01/14/2012 12:37 PM\  PULMONARY / CRITICAL CARE MEDICINE  BRIEF:  61 yo male former smoker admitted by [facility] on 01/14/2012 with progressive cough, purulent sputum, and dyspnea for 3 days.  Developed hypercapnic respiratory failure and metabolic encephalopathy on 6/03, and PCCM consulted.   PMHx COPD, OSA/OHS, CAD s/p CABG, CKD, Diastolic CHF, GERD  Events Since Admission: 6/4 - agitated and intubated 6/5 - failed wua and sbt 6/6 - started precedex, dc versed  SUBJECTIVE/OVERNIGHT/INTERVAL HX Walked in hall yesterday w/o difficulty.  Tolerating diet.  Breathing much better.  C/o hoarseness.  Wants to go home.  Vital Signs: Temp:  [98 F (36.7 C)-98.9 F (37.2 C)] 98.1 F (36.7 C) (06/11 0500) Pulse Rate:  [81-93] 84  (06/11 0500) Resp:  [20-30] 20  (06/11 0500) BP: (99-128)/(56-76) 112/76 mmHg (06/11 0500) SpO2:  [93 %-100 %] 98 % (06/11 0500) Weight:  [261 lb 4.8 oz (118.525 kg)-261 lb 11 oz (118.7 kg)] 261 lb 4.8 oz (118.525 kg) (06/11 0500)   Intake/Output Summary (Last 24 hours) at 01/23/12 0833 Last data filed at 01/23/12 0800  Gross per 24 hour  Intake   1370 ml  Output   2545 ml  Net  -1175 ml    Physical Examination: Gen: No distress, sitting in chair HEENT: no oral exudate PULM: No wheeze CV: s1s2 regular, no murmur AB: BS+, soft, nontender Ext: warm, no edema Neuro: Follows commands, moves all extremities  Lab Results  Component Value Date   WBC 9.0 01/23/2012   HGB 11.5* 01/23/2012   HCT 37.3* 01/23/2012   MCV 93.5 01/23/2012   PLT 172 01/23/2012   Lab Results  Component Value Date   CREATININE 2.02* 01/23/2012   BUN 66* 01/23/2012   NA 144 01/23/2012   K 5.0 01/23/2012   CL 108 01/23/2012   CO2 29 01/23/2012   Lab Results  Component Value Date   ALT 5 01/18/2012   AST 9  01/18/2012   ALKPHOS 53 01/18/2012   BILITOT 0.2* 01/18/2012   BNP (last 3 results)  Basename 01/20/12 0435 01/18/12 0400 01/15/12 2015  PROBNP 373.1* 423.2* 443.8*   ABG    Component Value Date/Time   PHART 7.299* 01/19/2012 1005   PCO2ART 72.2* 01/19/2012 1005   PO2ART 84.0 01/19/2012 1005   HCO3 35.6* 01/19/2012 1005   TCO2 38 01/19/2012 1005   O2SAT 95.0 01/19/2012 1005    ASSESSMENT AND PLAN  PULMONARY  ETT:  6/04>>6/08  A:  Acute on chronic hypoxic/hypercapnic respiratory failure in setting of AECOPD, OSA/OHS. P:   BPAP qhs Titrate oxygen to keep SpO2 > 90% >> not needing oxygen during day on 6/11 Bronchial hygiene Wean off prednisone over next 3 days Continue spiriva, advair, with prn albuterol  A: Hoarseness.  Likely related to intubation. P: If persists, then may need ENT eval as outpt  CARDIOVASCULAR  Lines: Rt radial aline 6/04>>6/8 Rt IJ CVL 6/04>>6/9  A: Acute volume overload related to cor pulmonale.  Resolved.  A: Hx of CAD, HTN, chronic diastolic dysfunction, hyperlipidemia, PVD P:  Continue ASA, lipitor, plavix, imdur, metoprolol  RENAL   Foley:  6/3>>6/9  A:  Chronic kidney disease (Baseline creatinine 1.76 from December 2012) with acute renal insufficiency.  Improving. P: F/u renal function as outpt with PCP Resume lasix, but only at  40 mg daily until assessed by PCP as outpt >> normally on 80 mg bid as outpt   A: Hypernatremia secondary to diuresis and insensible losses.  Resolved.  A: BPH P: Continue flomax  A: Hypokalemia.  Resolved.  GASTROINTESTINAL  A:  Hx of GERD P:   Protonix change to pepcid due to loose BMs  A: Loose bowel movements 6/09>>Improved after d/c protonix.  HEMATOLOGIC  A:  Anemia of critical illness P:  F/u CBC Transfuse for Hb < 7 Continue darbepoetin  INFECTIOUS  Cultures: Sputum 6/03>>negative  Antibiotics: Rocephin 6/02>>6/02 Zithromax 6/02>>6/02 Levaquin 6/03>>6/03 Avelox 6/03>>6/03 Zosyn  6/04>>6/06 Vancomycin 6/04>>6/05 Avelox 6/06>>6/9  A:  Acute tracheobronchitis in setting of AECOPD.  No evidence for PNA. P:   Off all ABX  ENDOCRINE  Lab 01/22/12 2129 01/22/12 1151 01/22/12 0732 01/21/12 2151 01/21/12 1537  GLUCAP 168* 213* 145* 112* 255*   A:  DM2 with steroid induced hyperglycemia. P:   Ask nutrition to assess diet Hold metformin (avoid in setting of chronic hypoxemia and ckd) Don't think he will need insulin at home >> CBG's should improve with decrease in prednisone  A: Gout P: Colchicine   NEUROLOGIC  A:  Acute metabolic encephalopathy 2nd to hypercapnia.  Baseline valium use.  Resolved. P:   Was on prn valium for sleep as outpt >> would avoid benzo's/narcotics due to concern for respiratory depression   BEST PRACTICE / DISPOSITION Level of Care: Floor Primary Service:  PCCM Consultants: None Code Status: Limited resuscitation>>no CPR/defibrillation.  Diet: CHO modified DVT Px: SQ heparin GI Px:  Pepcid  Plan to d/c home today.  Will need outpt follow up with Dr. Alwyn Pea (PCP) and Dr. Jetty Duhamel (Pulmonary).  Coralyn Helling, MD [facility] Pulmonary/Critical Care 01/23/2012, 8:46 AM Pager:  [phone] After 3pm call: [phone]

## 2012-01-23 NOTE — Discharge Summary (Signed)
Physician Discharge Summary     Patient ID: EXAVIER LINA MRN: [id number] DOB/AGE: 01-07-1951 61 y.o.  Admit date: 01/14/2012 Discharge date: 01/23/2012  Admission Diagnoses: Acute respiratory failure   Discharge Diagnoses:  Active Problems:  CORONARY ARTERY DISEASE, CABG 2001, last PCI 7/11  DIASTOLIC DYSFUNCTION  C O P D  RESPIRATORY FAILURE, CHRONIC, on BiPap  Diabetes mellitus  Acute respiratory failure with hypoxia  Encephalopathy acute  Acute respiratory failure  Metabolic alkalosis  Respiratory acidosis  Acute on chronic renal failure   Significant Hospital tests/ studies/ interventions and procedures   ETT: 6/04>>6/08 Lines:  Rt radial aline 6/04>>6/8  Rt IJ CVL 6/04>>6/9  Cultures:  Sputum 6/03>>negative   Antibiotics:  Rocephin 6/02>>6/02  Zithromax 6/02>>6/02  Levaquin 6/03>>6/03  Avelox 6/03>>6/03  Zosyn 6/04>>6/06  Vancomycin 6/04>>6/05  Avelox 6/06>>6/9  Brief history  61 yo male former smoker admitted by [facility] on 01/14/2012 with progressive cough, purulent sputum, and dyspnea for 3 days. Developed hypercapnic respiratory failure and metabolic encephalopathy on 6/03, and PCCM consulted.  PMHx COPD, OSA/OHS, CAD s/p CABG, CKD, Diastolic CHF, GERD Hospital Course:  Acute on chronic hypoxic/hypercapnic respiratory failure in setting of AECOPD, OSA/OHS. Admitted on 6/2 for progressive cough w/ associated dyspnea. Developed decreased LOC and hypercarbia w/ respiratory acidosis requiring intubation. He was treated in usual fashion with empiric antibiotics, systemic steroids, and bronchodilators. Also required intravenous diuresis while acutely ill.  He was extubated on 6/8.  His FIO2 has been weaned to off. He feels back to baseline at time of discharge.  Recommendations:  -continue BPAP qhs  -Wean off prednisone over next 3 days  Continue spiriva, advair, with prn albuterol   Hoarseness. Likely related to intubation.  Recommendations:  If persists,  then may need ENT eval as outpt   Acute volume overload related to cor pulmonale. Resolved. This is all in the setting of acutely decompensated diastolic heart failure. He has h/o  CAD, HTN, chronic diastolic dysfunction, hyperlipidemia, and PVD.  recommendations Continue ASA, lipitor, plavix, imdur, metoprolol   Chronic kidney disease (Baseline creatinine 1.76 from December 2012) with acute renal insufficiency. Improving. We have decreased his dose of lasix. This will need to be re-evaluated at his follow up appointment.  Recent Labs  Basename 01/23/12 0500 01/22/12 0400 01/21/12 0413   CREATININE 2.02* 2.03* 2.21*  recommendations  -F/u renal function as outpt with PCP: have asked that this be ordered when we made his follow-up -Resumed lasix, but only at 40 mg daily until assessed by PCP as outpt >> normally on 80 mg bid as outpt   Hypernatremia secondary to diuresis and insensible losses. Resolved.   BPH  Recommendations  Continue flomax   Hypokalemia. Treated and  Resolved.   Hx of GERD  Recommendation -home on his prior regimen   Loose bowel movements 6/09>>Improved after d/c protonix.    Anemia of critical illness   Lab 01/23/12 0500 01/21/12 0413 01/20/12 0435  HGB 11.5* 10.9* 10.6*   Recommendation  F/u in out-pt setting  DM2 with steroid induced hyperglycemia.  CBG (last 3) stopped Metformin (avoid in setting of chronic hypoxemia and ckd)   Basename 01/22/12 2129 01/22/12 1151 01/22/12 0732  GLUCAP 168* 213* 145*  recommendations:  Have reviewed low glycemic index alternatives for dietary choices.  Don't think he will need insulin at home >> CBG's should improve with decrease in prednisone  Will need follow up in the out patient setting   Gout  Recommendation  Colchicine  Acute metabolic encephalopathy 2nd to hypercapnia. Baseline valium use. Resolved.  Recommendation  Was on prn valium for sleep as outpt >> would avoid benzo's/narcotics due to  concern for respiratory depression    Discharge Exam: BP 114/72  Pulse 88  Temp(Src) 98.1 F (36.7 C) (Oral)  Resp 20  Ht 6\' 1"  (1.854 m)  Wt 118.525 kg (261 lb 4.8 oz)  BMI 34.47 kg/m2  SpO2 98%  Physical Examination:  Gen: No distress, sitting in chair  HEENT: no oral exudate  PULM: No wheeze  CV: s1s2 regular, no murmur  AB: BS+, soft, nontender  Ext: warm, no edema  Neuro: Follows commands, moves all extremities   Labs at discharge Lab Results  Component Value Date   CREATININE 2.02* 01/23/2012   BUN 66* 01/23/2012   NA 144 01/23/2012   K 5.0 01/23/2012   CL 108 01/23/2012   CO2 29 01/23/2012   Lab Results  Component Value Date   WBC 9.0 01/23/2012   HGB 11.5* 01/23/2012   HCT 37.3* 01/23/2012   MCV 93.5 01/23/2012   PLT 172 01/23/2012   Lab Results  Component Value Date   ALT 5 01/18/2012   AST 9 01/18/2012   ALKPHOS 53 01/18/2012   BILITOT 0.2* 01/18/2012   Lab Results  Component Value Date   INR 1.05 07/13/2011   INR 1.02 01/02/2011   INR 0.95 12/04/2010    Current radiology studies No results found.  Disposition:  01-Home or Self Care  Discharge Orders    Future Appointments: Provider: Department: Dept Phone: Center:   01/25/2012 1:45 PM [facility]  None   02/07/2012 11:00 AM Waymon Budge, MD [facility] [phone] None   03/28/2012 2:30 PM Waymon Budge, MD [facility] [phone] None     Future Orders Please Complete By Expires   Diet - low sodium heart healthy      Diet Carb Modified      Scheduling Instructions:   Avoid foods with high glycemic index.  Simple rules: Choose breads that are whole wheat Choose fruits that do not require peeling  Choose fruit and vege based diet Avoid juices Avoid Sodas Avoid white breads and pastas Avoid fruits that require peeling   Increase activity slowly      Discharge instructions      Comments:   Go to ER for shortness of breath or chest pain. Call Dr Maple Hudson:    For cough with colored mucous, fever or wheezing Call your primary doctor for increased swelling in legs, or if you start to notice increased fatigue or decreased ability to tolerate activity.   Call MD for:  temperature >100.4      Call MD for:  difficulty breathing, headache or visual disturbances      Call MD for:  persistant dizziness or light-headedness      Call MD for:  extreme fatigue      (HEART FAILURE PATIENTS) Call MD:  Anytime you have any of the following symptoms: 1) 3 pound weight gain in 24 hours or 5 pounds in 1 week 2) shortness of breath, with or without a dry hacking cough 3) swelling in the hands, feet or stomach 4) if you have to sleep on extra pillows at night in order to breathe.        Medication List  As of 01/23/2012 11:36 AM   STOP taking these medications         diazepam 5 MG tablet  metFORMIN 750 MG 24 hr tablet      potassium chloride SA 20 MEQ tablet         TAKE these medications         albuterol 108 (90 BASE) MCG/ACT inhaler   Commonly known as: PROVENTIL HFA;VENTOLIN HFA   Inhale 2 puffs into the lungs every 6 (six) hours as needed. For wheezing      aspirin EC 81 MG tablet   Take 81 mg by mouth daily.      clopidogrel 75 MG tablet   Commonly known as: PLAVIX   Take 75 mg by mouth daily.      COLCRYS 0.6 MG tablet   Generic drug: colchicine   Take 1 tablet by mouth daily.      Fluticasone-Salmeterol 250-50 MCG/DOSE Aepb   Commonly known as: ADVAIR   Inhale 1 puff into the lungs every 12 (twelve) hours as needed. For wheezing      furosemide 40 MG tablet   Commonly known as: LASIX   Take 1 tablet (40 mg total) by mouth daily.      isosorbide mononitrate 30 MG 24 hr tablet   Commonly known as: IMDUR   Take 45 mg by mouth daily.      metoprolol tartrate 25 MG tablet   Commonly known as: LOPRESSOR   Take 12.5 mg by mouth 2 (two) times daily.      multivitamin with minerals Tabs   Take 1 tablet by mouth daily.       nitroGLYCERIN 0.4 MG SL tablet   Commonly known as: NITROSTAT   Place 0.4 mg under the tongue every 5 (five) minutes as needed. For chest pain      pantoprazole 40 MG tablet   Commonly known as: PROTONIX   Take 1 tablet by mouth daily.      predniSONE 10 MG tablet   Commonly known as: DELTASONE   Take 3 tabs X 1 day, then 2 tabs X 1 day, then 1 tab X 1 day and stop.      rosuvastatin 20 MG tablet   Commonly known as: CRESTOR   Take 20 mg by mouth daily.      Tamsulosin HCl 0.4 MG Caps   Commonly known as: FLOMAX   Take 1 tablet by mouth daily.      tiotropium 18 MCG inhalation capsule   Commonly known as: SPIRIVA   Place 1 capsule (18 mcg total) into inhaler and inhale daily. For wheezing           Follow-up Information    Follow up with Altamese Frankfort, MD on 02/01/2012. (at 230pm)    Contact information:   [address], [address] [place] [address] [phone]       Follow up with Waymon Budge, MD on 03/28/2012. (at 230 pm)    Contact information:   [address] [employer] [place] [address] [phone]       Follow up with Waymon Budge, MD on 02/07/2012. (at 11 am)    Contact information:   [address] [employer] [place] [address] [phone]          Discharged Condition: good, ambulatory in hall without shortness of breath   Physician Statement:   The Patient was personally examined, the discharge assessment and plan has been personally reviewed and I agree with ACNP Babcock's assessment and plan. > 30 minutes of time have  been dedicated to discharge assessment, planning and discharge instructions.   SignedAnders Simmonds 01/23/2012, 11:36 AM  Coralyn Helling, MD [facility] Pulmonary/Critical Care 01/23/2012, 2:40 PM Pager:  [phone] After 3pm call: [phone]

## 2012-01-23 NOTE — Progress Notes (Signed)
Went over all discharge instructions with pt and wife earlier had gone over using teachback & handouts to provide more education about Diabetes. Pt given handouts with sample meals for carb modified, how to eat out within Diabetic guidelines, sick day management. Consulted Dietician earlier when attempting to teach pt & he stated he didn't understand what a " carb" was. Thought he was doing right by avoiding plain sugar. Went over Heart failure booklet, daily weights and 2 Gm Na diet. Pt aware of Follow up appts, home meds, and when to call MD. Discharged per w/c with family and all belongings. Accompanied by volunteer and family. Marisa Cyphers RN

## 2012-01-23 NOTE — Plan of Care (Signed)
Problem: Food- and Nutrition-Related Knowledge Deficit (NB-1.1) Goal: Nutrition education Formal process to instruct or train a patient/client in a skill or to impart knowledge to help patients/clients voluntarily manage or modify food choices and eating behavior to maintain or improve health.  Outcome: Completed/Met Date Met:  01/23/12 RD consulted for diet education for pt with DM who will be going home with out metformin which he was previously on. Pt stated that he does not count carbs and does not really know what they are. RD provided pt with simple label reading tips hand out for diabetes. RD explained what carbohydrate are and the effect on blood sugar. Pt states he checks is blood sugar daily and it usually is 120-140 after meals. RD answered all pt questions. Chart reviewed, no additional nutrition interventions at this time.  Body mass index is 34.47 kg/(m^2). Pt is obese.   Clarene Duke MARIE

## 2012-01-25 ENCOUNTER — Encounter (HOSPITAL_COMMUNITY): Payer: Medicare Other

## 2012-02-01 ENCOUNTER — Encounter (HOSPITAL_COMMUNITY)
Admission: RE | Admit: 2012-02-01 | Discharge: 2012-02-01 | Disposition: A | Discharge status: Home / Self Care | Payer: Medicare Other | Source: Ambulatory Visit | Attending: Nephrology | Admitting: Nephrology

## 2012-02-01 DIAGNOSIS — N183 Chronic kidney disease, stage 3 unspecified: Secondary | ICD-10-CM | POA: Insufficient documentation

## 2012-02-01 DIAGNOSIS — D638 Anemia in other chronic diseases classified elsewhere: Secondary | ICD-10-CM | POA: Insufficient documentation

## 2012-02-01 LAB — FERRITIN: Ferritin: 601 ng/mL — ABNORMAL HIGH (ref 22–322)

## 2012-02-01 LAB — IRON AND TIBC: Iron: 49 ug/dL (ref 42–135)

## 2012-02-01 MED ORDER — DARBEPOETIN ALFA-POLYSORBATE 500 MCG/ML IJ SOLN
60.0000 ug | INTRAMUSCULAR | Status: DC
  Filled 2012-02-01: qty 1

## 2012-02-01 MED ORDER — DARBEPOETIN ALFA-POLYSORBATE 60 MCG/0.3ML IJ SOLN
INTRAMUSCULAR | Status: AC
  Administered 2012-02-01: 60 ug via SUBCUTANEOUS
  Filled 2012-02-01: qty 0.3

## 2012-02-01 MED ORDER — DARBEPOETIN ALFA-POLYSORBATE 40 MCG/0.4ML IJ SOLN
INTRAMUSCULAR | Status: AC
  Filled 2012-02-01: qty 0.4

## 2012-02-02 ENCOUNTER — Telehealth: Payer: Self-pay | Admitting: Internal Medicine

## 2012-02-07 ENCOUNTER — Encounter: Payer: Self-pay | Admitting: Internal Medicine

## 2012-02-07 ENCOUNTER — Ambulatory Visit (INDEPENDENT_AMBULATORY_CARE_PROVIDER_SITE_OTHER): Payer: Medicare Other | Admitting: Internal Medicine

## 2012-02-07 VITALS — BP 138/80 | HR 105 | Ht 73.0 in | Wt 275.6 lb

## 2012-02-07 DIAGNOSIS — G4733 Obstructive sleep apnea (adult) (pediatric): Secondary | ICD-10-CM

## 2012-02-07 DIAGNOSIS — J449 Chronic obstructive pulmonary disease, unspecified: Secondary | ICD-10-CM

## 2012-02-07 DIAGNOSIS — J9610 Chronic respiratory failure, unspecified whether with hypoxia or hypercapnia: Secondary | ICD-10-CM

## 2012-02-07 DIAGNOSIS — J4489 Other specified chronic obstructive pulmonary disease: Secondary | ICD-10-CM

## 2012-02-07 NOTE — Telephone Encounter (Signed)
Error.  Made an appt w/ CY for 6/26 instead of leaving message.  [name] [name]

## 2012-02-07 NOTE — Telephone Encounter (Signed)
[name] [name], was there a msg needed?

## 2012-02-07 NOTE — Progress Notes (Signed)
Patient ID: [name] [name], male    DOB: 03/06/1951, 61 y.o.   MRN: [id number]  HPI 01/23/11- COPD, CAD/diastolic dysfunction, OSA, chronic respiratory failure, obesity Hypoventilation, cor pulmonale Hosp since last here- once for "shakes"-they told him wasn't using CPAP right, once for epistaxis, once to place urinary stent, and has had a right ? renal biopsy. Told anemic.  Breathing stable, but can't lie flat- feels smothered even on O2.  Runs O2 2-3L/M usually. I gave permision to go to 4 during exertion. Notes "tiresome" feeling midchest, related to exertion/ climbing stairs and relieved by rest. Told to pace himself and not try to push through that.  Discussed need for O2. He doesn't sleep well with his BiPAP 15/12. Discussed sleep hygiene- discussed room temperature. He was more comfortable with autotitration in hosp and we discussed a trial of that at home.  Has restarted diuretic since leg edema has come back.   05/25/11-  COPD, CAD/diastolic dysfunction, OSA, chronic respiratory failure, obesity Hypoventilation, cor pulmonale Went to ER last week- short of breath. Air wasn't satisfying. Was started back on shot for anemia. Feels some better.  Reports sneezing, postnasal drainage and a sense of mucus in his upper chest. Denies fever, sore throat and doesn't think he has a cold.  06/28/11-  COPD, CAD/diastolic dysfunction, OSA, chronic respiratory failure, obesity Hypoventilation, cor pulmonale Recently hospitalized October 25-29, notes reviewed with him and x-ray images reviewed by me. He was hospitalized for hypercapnic respiratory failure with transient encephalopathy and renal insufficiency. Since discharge he has regained some ankle edema while off of furosemide. He has a nephrology appointment later this month. He is concerned that he was taken off his diabetes medicines and is directed to discuss this with his primary physician immediately. He has some persistent soreness across his  anterior chest wall at the level of the xiphoid but otherwise breathing better with less exertional dyspnea and before he went in the hospital. He remains dependent on continuous oxygen at 2 L. He continues his BiPAP machine all night, every night but says he was sleeping better with it  on autotitration with a lower pressure used in the hospital.  09/25/11- COPD, CAD/diastolic dysfunction, OSA, chronic respiratory failure, obesity Hypoventilation, cor pulmonale Since last here he was hospitalized for respiratory failure with CHF and obesity hypoventilation. Better with diuresis. BiPAP 13/10 is now comfortable and used all night every night with supplemental oxygen 4 L. Tolerated colonoscopy without respiratory distress. Uses a rescue inhaler only occasionally but says it does help then.  11/23/11- COPD, CAD/diastolic dysfunction, OSA, chronic respiratory failure, obesity Hypoventilation, cor pulmonale She is noticing some increased shortness of breath on exertion such as getting in the car but no increase in ankle edema which is well controlled. Occasional cough is not progressive or productive. Noticing more rhinorrhea with the pollen. Continues BiPAP  I 13/E 10 all night every night.  02/07/12- COPD, CAD/diastolic dysfunction, OSA, chronic respiratory failure, obesity Hypoventilation, cor pulmonale  pt states doing some better.wakes up out of a nap coughing feels like he's choking.  Post Hosp- 01/14/12- 01/23/12-discharge diagnoses reviewed with him: Coronary artery disease/CABG, diastolic dysfunction, COPD with chronic respiratory failure, acute respiratory failure with hypoxia, acute on chronic renal failure, DM. He was intubated for 4 days and treated with Zosyn, vancomycin and Avelox, Levaquin, Zithromax and Rocephin. Cultures negative. He was to wean off of prednisone over 3 days. He was hoarse after intubation but that has resolved. He is using oxygen 1-2  L and BiPAP  13/10 at night. He feels he is  pretty close to his baseline.  Review of Systems-see HPI Constitutional:   +  weight loss,  No-night sweats, fevers, chills, fatigue, lassitude. HEENT:   No-  headaches, difficulty swallowing, tooth/dental problems, sore throat,       +  sneezing, itching, ear ache, nasal congestion, post nasal drip,  CV:  No-   chest pain, orthopnea, PND,  Much less-swelling in lower extremities, anasarca, dizziness, palpitations Resp: + shortness of breath with exertion or at rest.              No-   productive cough,  + non-productive cough,  No-  coughing up of blood.              No-   change in color of mucus.  No- wheezing.   Skin: No-   rash or lesions. GI:  No-   heartburn, indigestion, abdominal pain, nausea, vomiting,  GU: MS:  No-   joint pain or swelling.  Neuro- nothing unusual Psych:  No- change in mood or affect. No depression or anxiety.  No memory loss.     Objective:   Physical Exam BP 138/80  Pulse 105  Ht 6\' 1"  (1.854 m)  Wt 275 lb 9.6 oz (125.011 kg)  BMI 36.36 kg/m2  SpO2 86% on room air    General- Alert, Oriented, Affect-appropriate, Distress- none acute;  Obese,   Portable pulse O2 - sat 92% at rest on 1 L Skin- rash-none, lesions- none, excoriation- none Lymphadenopathy- none Head- atraumatic            Eyes- Gross vision intact, PERRLA, conjunctivae clear secretions            Ears- Hearing, canals- normal            Nose- Clear, No-Septal dev mucus, polyps, erosion, perforation             Throat- Mallampati II , mucosa clear , drainage- none, tonsils- atrophic Neck- flexible , trachea midline, no stridor , thyroid nl, carotid no bruit Chest - symmetrical excursion , unlabored           Heart/CV- RRR , 1/6 systolic murmur , no gallop  , no rub, nl s1 s2                           - JVD- none , edema- none on today's exam, stasis changes- none,  varices- none           Lung- clear to P&A/ distant, wheeze- none, cough- none , dullness-none, rub- none. No rales  heard.           Chest wall-  Abd- Br/ Gen/ Rectal- Not done, not indicated Extrem- cyanosis- none, clubbing, none, atrophy- none, strength- nl Neuro- grossly intact to observation

## 2012-02-07 NOTE — Patient Instructions (Addendum)
Order- schedule 6 MWT - on room air  Continue oxygen with your BIPAP at night.     Use oxygen most of the time. It is probably ok to sit quietly without it, or on 1 L, but with walking or exertion you should probably have your oxygen on at 1-2 L/M

## 2012-02-11 NOTE — Assessment & Plan Note (Signed)
He seems comfortable today. We discussed adjustment of his oxygen during exertion. He probably needs 2-3 L, but is prompted to turn it back down to 1 L at rest to minimize CO2 retention.

## 2012-02-11 NOTE — Assessment & Plan Note (Addendum)
He wears BiPAP both for sleep apnea and for respiratory failure. Good compliance and control.

## 2012-02-29 ENCOUNTER — Encounter (HOSPITAL_COMMUNITY)
Admission: RE | Admit: 2012-02-29 | Discharge: 2012-02-29 | Disposition: A | Discharge status: Home / Self Care | Payer: Medicare Other | Source: Ambulatory Visit | Attending: Nephrology | Admitting: Nephrology

## 2012-02-29 DIAGNOSIS — N183 Chronic kidney disease, stage 3 unspecified: Secondary | ICD-10-CM | POA: Insufficient documentation

## 2012-02-29 DIAGNOSIS — D638 Anemia in other chronic diseases classified elsewhere: Secondary | ICD-10-CM | POA: Insufficient documentation

## 2012-02-29 LAB — IRON AND TIBC
Iron: 46 ug/dL (ref 42–135)
Saturation Ratios: 20 % (ref 20–55)
TIBC: 225 ug/dL (ref 215–435)

## 2012-02-29 LAB — FERRITIN: Ferritin: 528 ng/mL — ABNORMAL HIGH (ref 22–322)

## 2012-02-29 LAB — POCT HEMOGLOBIN-HEMACUE: Hemoglobin: 10.3 g/dL — ABNORMAL LOW (ref 13.0–17.0)

## 2012-02-29 MED ORDER — DARBEPOETIN ALFA-POLYSORBATE 60 MCG/0.3ML IJ SOLN
INTRAMUSCULAR | Status: AC
  Administered 2012-02-29: 60 ug via SUBCUTANEOUS
  Filled 2012-02-29: qty 0.3

## 2012-02-29 MED ORDER — DARBEPOETIN ALFA-POLYSORBATE 500 MCG/ML IJ SOLN
60.0000 ug | INTRAMUSCULAR | Status: DC
  Filled 2012-02-29: qty 1

## 2012-03-19 ENCOUNTER — Other Ambulatory Visit (HOSPITAL_COMMUNITY): Payer: Self-pay | Admitting: *Deleted

## 2012-03-20 ENCOUNTER — Encounter (HOSPITAL_COMMUNITY)
Admission: RE | Admit: 2012-03-20 | Discharge: 2012-03-20 | Disposition: A | Discharge status: Home / Self Care | Payer: Medicare Other | Source: Ambulatory Visit | Attending: Nephrology | Admitting: Nephrology

## 2012-03-20 DIAGNOSIS — D638 Anemia in other chronic diseases classified elsewhere: Secondary | ICD-10-CM | POA: Insufficient documentation

## 2012-03-20 DIAGNOSIS — N183 Chronic kidney disease, stage 3 unspecified: Secondary | ICD-10-CM | POA: Insufficient documentation

## 2012-03-20 MED ORDER — FERUMOXYTOL INJECTION 510 MG/17 ML
510.0000 mg | INTRAVENOUS | Status: DC
  Administered 2012-03-20: 510 mg via INTRAVENOUS
  Filled 2012-03-20: qty 17

## 2012-03-20 MED ORDER — SODIUM CHLORIDE 0.9 % IV SOLN
INTRAVENOUS | Status: DC
  Administered 2012-03-20: 10 mL/h via INTRAVENOUS

## 2012-03-26 ENCOUNTER — Other Ambulatory Visit (HOSPITAL_COMMUNITY): Payer: Self-pay | Admitting: *Deleted

## 2012-03-28 ENCOUNTER — Encounter: Payer: Self-pay | Admitting: Internal Medicine

## 2012-03-28 ENCOUNTER — Ambulatory Visit (INDEPENDENT_AMBULATORY_CARE_PROVIDER_SITE_OTHER): Payer: Medicare Other | Admitting: Internal Medicine

## 2012-03-28 ENCOUNTER — Ambulatory Visit (INDEPENDENT_AMBULATORY_CARE_PROVIDER_SITE_OTHER)
Admission: RE | Admit: 2012-03-28 | Discharge: 2012-03-28 | Disposition: A | Discharge status: Home / Self Care | Payer: Medicare Other | Source: Ambulatory Visit | Attending: Internal Medicine | Admitting: Internal Medicine

## 2012-03-28 ENCOUNTER — Encounter (HOSPITAL_COMMUNITY)
Admission: RE | Admit: 2012-03-28 | Discharge: 2012-03-28 | Disposition: A | Discharge status: Home / Self Care | Payer: Medicare Other | Source: Ambulatory Visit | Attending: Nephrology | Admitting: Nephrology

## 2012-03-28 VITALS — BP 130/78 | HR 83 | Ht 74.0 in | Wt 290.0 lb

## 2012-03-28 DIAGNOSIS — R0789 Other chest pain: Secondary | ICD-10-CM

## 2012-03-28 DIAGNOSIS — R079 Chest pain, unspecified: Secondary | ICD-10-CM

## 2012-03-28 DIAGNOSIS — E678 Other specified hyperalimentation: Secondary | ICD-10-CM

## 2012-03-28 DIAGNOSIS — J449 Chronic obstructive pulmonary disease, unspecified: Secondary | ICD-10-CM

## 2012-03-28 DIAGNOSIS — J9610 Chronic respiratory failure, unspecified whether with hypoxia or hypercapnia: Secondary | ICD-10-CM

## 2012-03-28 DIAGNOSIS — G4733 Obstructive sleep apnea (adult) (pediatric): Secondary | ICD-10-CM

## 2012-03-28 DIAGNOSIS — R071 Chest pain on breathing: Secondary | ICD-10-CM

## 2012-03-28 LAB — POCT HEMOGLOBIN-HEMACUE: Hemoglobin: 10.3 g/dL — ABNORMAL LOW (ref 13.0–17.0)

## 2012-03-28 LAB — IRON AND TIBC
Saturation Ratios: 25 % (ref 20–55)
TIBC: 230 ug/dL (ref 215–435)
UIBC: 172 ug/dL (ref 125–400)

## 2012-03-28 MED ORDER — DARBEPOETIN ALFA-POLYSORBATE 60 MCG/0.3ML IJ SOLN
INTRAMUSCULAR | Status: AC
  Administered 2012-03-28: 60 ug via SUBCUTANEOUS
  Filled 2012-03-28: qty 0.3

## 2012-03-28 MED ORDER — SODIUM CHLORIDE 0.9 % IV SOLN
INTRAVENOUS | Status: DC
  Administered 2012-03-28: 12:00:00 via INTRAVENOUS

## 2012-03-28 MED ORDER — FERUMOXYTOL INJECTION 510 MG/17 ML
510.0000 mg | INTRAVENOUS | Status: DC
  Administered 2012-03-28: 510 mg via INTRAVENOUS

## 2012-03-28 MED ORDER — ALBUTEROL SULFATE (2.5 MG/3ML) 0.083% IN NEBU: 2.5000 mg | INHALATION_SOLUTION | Freq: Four times a day (QID) | RESPIRATORY_TRACT | Status: DC | PRN

## 2012-03-28 MED ORDER — DARBEPOETIN ALFA-POLYSORBATE 500 MCG/ML IJ SOLN
60.0000 ug | INTRAMUSCULAR | Status: DC
  Filled 2012-03-28: qty 1

## 2012-03-28 MED ORDER — FERUMOXYTOL INJECTION 510 MG/17 ML
INTRAVENOUS | Status: AC
  Filled 2012-03-28: qty 17

## 2012-03-28 NOTE — Patient Instructions (Addendum)
Order- CXR-   Dx  Left anterior chest pain  Order- DME Advanced-  Bilevel autotitration 7 days Inspiratory 10-12   Expiratory 5-15 cwp for pressure check    Dx OSA  Script to refill neb medication sent

## 2012-03-28 NOTE — Progress Notes (Signed)
Patient ID: [name] [name], male    DOB: 03/06/1951, 61 y.o.   MRN: [id number]  HPI 01/23/11- COPD, CAD/diastolic dysfunction, OSA, chronic respiratory failure, obesity Hypoventilation, cor pulmonale Hosp since last here- once for "shakes"-they told him wasn't using CPAP right, once for epistaxis, once to place urinary stent, and has had a right ? renal biopsy. Told anemic.  Breathing stable, but can't lie flat- feels smothered even on O2.  Runs O2 2-3L/M usually. I gave permision to go to 4 during exertion. Notes "tiresome" feeling midchest, related to exertion/ climbing stairs and relieved by rest. Told to pace himself and not try to push through that.  Discussed need for O2. He doesn't sleep well with his BiPAP 15/12. Discussed sleep hygiene- discussed room temperature. He was more comfortable with autotitration in hosp and we discussed a trial of that at home.  Has restarted diuretic since leg edema has come back.   05/25/11-  COPD, CAD/diastolic dysfunction, OSA, chronic respiratory failure, obesity Hypoventilation, cor pulmonale Went to ER last week- short of breath. Air wasn't satisfying. Was started back on shot for anemia. Feels some better.  Reports sneezing, postnasal drainage and a sense of mucus in his upper chest. Denies fever, sore throat and doesn't think he has a cold.  06/28/11-  COPD, CAD/diastolic dysfunction, OSA, chronic respiratory failure, obesity Hypoventilation, cor pulmonale Recently hospitalized October 25-29, notes reviewed with him and x-ray images reviewed by me. He was hospitalized for hypercapnic respiratory failure with transient encephalopathy and renal insufficiency. Since discharge he has regained some ankle edema while off of furosemide. He has a nephrology appointment later this month. He is concerned that he was taken off his diabetes medicines and is directed to discuss this with his primary physician immediately. He has some persistent soreness across his  anterior chest wall at the level of the xiphoid but otherwise breathing better with less exertional dyspnea and before he went in the hospital. He remains dependent on continuous oxygen at 2 L. He continues his BiPAP machine all night, every night but says he was sleeping better with it  on autotitration with a lower pressure used in the hospital.  09/25/11- COPD, CAD/diastolic dysfunction, OSA, chronic respiratory failure, obesity Hypoventilation, cor pulmonale Since last here he was hospitalized for respiratory failure with CHF and obesity hypoventilation. Better with diuresis. BiPAP 13/10 is now comfortable and used all night every night with supplemental oxygen 4 L. Tolerated colonoscopy without respiratory distress. Uses a rescue inhaler only occasionally but says it does help then.  11/23/11- COPD, CAD/diastolic dysfunction, OSA, chronic respiratory failure, obesity Hypoventilation, cor pulmonale She is noticing some increased shortness of breath on exertion such as getting in the car but no increase in ankle edema which is well controlled. Occasional cough is not progressive or productive. Noticing more rhinorrhea with the pollen. Continues BiPAP  I 13/E 10 all night every night.  02/07/12- COPD, CAD/diastolic dysfunction, OSA, chronic respiratory failure, obesity Hypoventilation, cor pulmonale  pt states doing some better.wakes up out of a nap coughing feels like he's choking.  Post Hosp- 01/14/12- 01/23/12-discharge diagnoses reviewed with him: Coronary artery disease/CABG, diastolic dysfunction, COPD with chronic respiratory failure, acute respiratory failure with hypoxia, acute on chronic renal failure, DM. He was intubated for 4 days and treated with Zosyn, vancomycin and Avelox, Levaquin, Zithromax and Rocephin. Cultures negative. He was to wean off of prednisone over 3 days. He was hoarse after intubation but that has resolved. He is using oxygen 1-2  L and BiPAP  13/10 at night. He feels he is  pretty close to his baseline.  03/28/12-  COPD, CAD/diastolic dysfunction/ chronic CHF, OSA, chronic respiratory failure, obesity Hypoventilation, cor pulmonale  6 MWT today. Pt states breathing has been "okay" since last OV. Pt states having trouble falling alseep-not sleeping well on BiPAP. Pt states mask fit well but thinks the pressure needs to be adjusted again. Currently 13/10+ oxygen at 2 L/ Advanced. He sleeps better in a recliner with oxygen but without his BiPAP. Getting iron injections for anemia. We discussed shortness of breath and anemia. Pain left anterior axillary line described as a sore ache over the past week. He is able to raise his arm. Thinks he maybe lifted something wrong. COPD assessment test (CAT) 21/40 CXR 01/14/12- 1 view- IMPRESSION:  Stable support apparatus. Residual mild interstitial prominence  with slight improvement in aeration. No convincing pulmonary  edema. Probable small left pleural effusion with left basilar  atelectasis or infiltrate.  Original Report Authenticated By: Natasha Mead, M.D.   Review of Systems-see HPI Constitutional:   +  weight loss,  No-night sweats, fevers, chills, fatigue, lassitude. HEENT:   No-  headaches, difficulty swallowing, tooth/dental problems, sore throat,       +  sneezing, itching, ear ache, nasal congestion, post nasal drip,  CV:  +chest pain, no-orthopnea, PND,  Much less-swelling in lower extremities, anasarca, dizziness, palpitations Resp: + shortness of breath with exertion or at rest.              No-   productive cough,  + non-productive cough,  No-  coughing up of blood.              No-   change in color of mucus.  No- wheezing.   Skin: No-   rash or lesions. GI:  No-   heartburn, indigestion, abdominal pain, nausea, vomiting,  GU: MS:  No-   joint pain or swelling.  Neuro- nothing unusual Psych:  No- change in mood or affect. No depression or anxiety.  No memory loss. Objective:   Physical Exam .General- Alert,  Oriented, Affect-appropriate, Distress- none acute;  Obese,   Portable pulse O2 - sat 92% at rest on 1 L Skin- rash-none, lesions- none, excoriation- none Lymphadenopathy- none Head- atraumatic            Eyes- Gross vision intact, PERRLA, conjunctivae clear secretions            Ears- Hearing, canals- normal            Nose- Clear, No-Septal dev mucus, polyps, erosion, perforation             Throat- Mallampati II , mucosa clear , drainage- none, tonsils- atrophic Neck- flexible , trachea midline, no stridor , thyroid nl, carotid no bruit Chest - symmetrical excursion , unlabored           Heart/CV- RRR , 1/6 systolic murmur , no gallop  , no rub, nl s1 s2                           - JVD- none , edema- none on today's exam, stasis changes- none,  varices- none           Lung- clear to P&A/ distant, wheeze- none, cough- none , dullness-none, rub- none. No rales heard.           Chest wall- left axilla not tender in the  area indicated Abd- Br/ Gen/ Rectal- Not done, not indicated Extrem- cyanosis- none, clubbing, none, atrophy- none, strength- nl Neuro- grossly intact to observation

## 2012-04-02 ENCOUNTER — Telehealth: Payer: Self-pay | Admitting: Internal Medicine

## 2012-04-02 NOTE — Progress Notes (Signed)
03/28/12- Documentation for 6 minute walk test

## 2012-04-02 NOTE — Telephone Encounter (Signed)
Pt is aware ot have his oxygen set at 3l/m w. Exertion and 2 liters at rest. Nothing further was needed

## 2012-04-02 NOTE — Telephone Encounter (Signed)
Pt should run O2 at 3L/M with exertion and 2L/M at rest- this is based on desat during 6MW-dropped to 88%.

## 2012-04-02 NOTE — Telephone Encounter (Signed)
 test was sent to Dr. Allyson Sabal Pt aware that he needs to call him for appt He wants to know what the test showed, and make sure no changes are needed for him Please advise thanks!

## 2012-04-02 NOTE — Telephone Encounter (Signed)
Result Notes     Notes Recorded by Fenton Foy, CMA on 04/02/2012 at 10:29 AM Attempted to contact patient, left message for patient to call back on home and cell phones. ------  Notes Recorded by Waymon Budge, MD on 03/29/2012 at 10:19 AM CXR still looks like mild to moderate congestive heart failure- too much water in the lungs. Keep appointments with cardiologist.   Called spoke with patient, advised of cxr results / recs as stated by CY.  Pt stated he has recently seen Dr Allyson Sabal within the last 2-3 weeks; does not recall having a cxr through that office.  The cxr report has been faxed to Dr Allyson Sabal via [facility].    Dr Maple Hudson, pt is asking if you would like a referral placed for him to follow up with Dr Allyson Sabal again (advised pt he could call that office) and his test results from 8.15.13.  Please advise, thanks.

## 2012-04-02 NOTE — Telephone Encounter (Signed)
6 MWT documented and signed. Ok to send copy to Dr Erlene Quan. Patient can make his own f/u appointment there.

## 2012-04-03 DIAGNOSIS — R0789 Other chest pain: Secondary | ICD-10-CM | POA: Insufficient documentation

## 2012-04-03 NOTE — Assessment & Plan Note (Signed)
He understands the importance of weight loss but is frustrated by his inability to exercise enough to accomplish this

## 2012-04-03 NOTE — Assessment & Plan Note (Signed)
We will ask for update bilateral auto titration for pressure check

## 2012-04-03 NOTE — Assessment & Plan Note (Signed)
He is referring to sleep without his BiPAP which is mostly a comfort issue. Plan-update autotitration. Continue oxygen

## 2012-04-23 ENCOUNTER — Other Ambulatory Visit (HOSPITAL_COMMUNITY): Payer: Self-pay | Admitting: *Deleted

## 2012-04-25 ENCOUNTER — Encounter (HOSPITAL_COMMUNITY)
Admission: RE | Admit: 2012-04-25 | Discharge: 2012-04-25 | Disposition: A | Discharge status: Home / Self Care | Payer: Medicare Other | Source: Ambulatory Visit | Attending: Nephrology | Admitting: Nephrology

## 2012-04-25 DIAGNOSIS — N183 Chronic kidney disease, stage 3 unspecified: Secondary | ICD-10-CM | POA: Insufficient documentation

## 2012-04-25 DIAGNOSIS — D638 Anemia in other chronic diseases classified elsewhere: Secondary | ICD-10-CM | POA: Insufficient documentation

## 2012-04-25 LAB — POCT HEMOGLOBIN-HEMACUE: Hemoglobin: 10.3 g/dL — ABNORMAL LOW (ref 13.0–17.0)

## 2012-04-25 MED ORDER — DARBEPOETIN ALFA-POLYSORBATE 500 MCG/ML IJ SOLN
60.0000 ug | INTRAMUSCULAR | Status: DC
  Filled 2012-04-25: qty 1

## 2012-04-25 MED ORDER — DARBEPOETIN ALFA-POLYSORBATE 60 MCG/0.3ML IJ SOLN
INTRAMUSCULAR | Status: AC
  Administered 2012-04-25: 60 ug via SUBCUTANEOUS
  Filled 2012-04-25: qty 0.3

## 2012-05-23 ENCOUNTER — Encounter (HOSPITAL_COMMUNITY): Payer: Medicare Other

## 2012-05-27 ENCOUNTER — Encounter (HOSPITAL_COMMUNITY)
Admission: RE | Admit: 2012-05-27 | Discharge: 2012-05-27 | Disposition: A | Discharge status: Home / Self Care | Payer: Medicare Other | Source: Ambulatory Visit | Attending: Nephrology | Admitting: Nephrology

## 2012-05-27 DIAGNOSIS — D638 Anemia in other chronic diseases classified elsewhere: Secondary | ICD-10-CM | POA: Insufficient documentation

## 2012-05-27 DIAGNOSIS — N183 Chronic kidney disease, stage 3 unspecified: Secondary | ICD-10-CM | POA: Insufficient documentation

## 2012-05-27 LAB — POCT HEMOGLOBIN-HEMACUE: Hemoglobin: 10.5 g/dL — ABNORMAL LOW (ref 13.0–17.0)

## 2012-05-27 LAB — RENAL FUNCTION PANEL
Albumin: 3.8 g/dL (ref 3.5–5.2)
BUN: 38 mg/dL — ABNORMAL HIGH (ref 6–23)
Chloride: 94 mEq/L — ABNORMAL LOW (ref 96–112)
Glucose, Bld: 112 mg/dL — ABNORMAL HIGH (ref 70–99)
Potassium: 4.3 mEq/L (ref 3.5–5.1)

## 2012-05-27 LAB — IRON AND TIBC: UIBC: 177 ug/dL (ref 125–400)

## 2012-05-27 MED ORDER — DARBEPOETIN ALFA-POLYSORBATE 500 MCG/ML IJ SOLN
60.0000 ug | INTRAMUSCULAR | Status: DC
  Filled 2012-05-27: qty 1

## 2012-05-27 MED ORDER — DARBEPOETIN ALFA-POLYSORBATE 60 MCG/0.3ML IJ SOLN
INTRAMUSCULAR | Status: AC
  Administered 2012-05-27: 60 ug via SUBCUTANEOUS
  Filled 2012-05-27: qty 0.3

## 2012-06-10 ENCOUNTER — Telehealth: Payer: Self-pay | Admitting: Internal Medicine

## 2012-06-10 NOTE — Telephone Encounter (Signed)
I spoke with pt and advised him no charge to fill out [employer] form for him. He will be dropping this off tomorrow. Will forward to [facility] as an [place]

## 2012-06-11 ENCOUNTER — Telehealth: Payer: Self-pay | Admitting: Internal Medicine

## 2012-06-13 NOTE — Telephone Encounter (Signed)
I left message on patients phone that forms have been filled out and placed at front for pick up as well as a copy the pt wanted for his records. Copy sent o HIM to scan in EPIC.

## 2012-06-17 ENCOUNTER — Ambulatory Visit (INDEPENDENT_AMBULATORY_CARE_PROVIDER_SITE_OTHER): Payer: Medicare Other

## 2012-06-17 DIAGNOSIS — Z23 Encounter for immunization: Secondary | ICD-10-CM

## 2012-06-27 ENCOUNTER — Encounter (HOSPITAL_COMMUNITY)
Admission: RE | Admit: 2012-06-27 | Discharge: 2012-06-27 | Disposition: A | Discharge status: Home / Self Care | Payer: Medicare Other | Source: Ambulatory Visit | Attending: Nephrology | Admitting: Nephrology

## 2012-06-27 DIAGNOSIS — N183 Chronic kidney disease, stage 3 unspecified: Secondary | ICD-10-CM | POA: Insufficient documentation

## 2012-06-27 DIAGNOSIS — D638 Anemia in other chronic diseases classified elsewhere: Secondary | ICD-10-CM | POA: Insufficient documentation

## 2012-06-27 LAB — RENAL FUNCTION PANEL
Albumin: 3.7 g/dL (ref 3.5–5.2)
CO2: 34 mEq/L — ABNORMAL HIGH (ref 19–32)
Calcium: 9.5 mg/dL (ref 8.4–10.5)
GFR calc Af Amer: 46 mL/min — ABNORMAL LOW (ref >90)
GFR calc non Af Amer: 39 mL/min — ABNORMAL LOW (ref >90)
Sodium: 141 mEq/L (ref 135–145)

## 2012-06-27 MED ORDER — DARBEPOETIN ALFA-POLYSORBATE 60 MCG/0.3ML IJ SOLN
INTRAMUSCULAR | Status: AC
  Administered 2012-06-27: 60 ug via SUBCUTANEOUS
  Filled 2012-06-27: qty 0.3

## 2012-06-27 MED ORDER — DARBEPOETIN ALFA-POLYSORBATE 500 MCG/ML IJ SOLN
60.0000 ug | INTRAMUSCULAR | Status: DC
  Filled 2012-06-27: qty 1

## 2012-07-24 ENCOUNTER — Other Ambulatory Visit (HOSPITAL_COMMUNITY): Payer: Self-pay | Admitting: *Deleted

## 2012-07-25 ENCOUNTER — Encounter (HOSPITAL_COMMUNITY)
Admission: RE | Admit: 2012-07-25 | Discharge: 2012-07-25 | Disposition: A | Discharge status: Home / Self Care | Payer: Medicare Other | Source: Ambulatory Visit | Attending: Nephrology | Admitting: Nephrology

## 2012-07-25 DIAGNOSIS — N183 Chronic kidney disease, stage 3 unspecified: Secondary | ICD-10-CM | POA: Insufficient documentation

## 2012-07-25 DIAGNOSIS — D638 Anemia in other chronic diseases classified elsewhere: Secondary | ICD-10-CM | POA: Insufficient documentation

## 2012-07-25 LAB — RENAL FUNCTION PANEL
CO2: 37 mEq/L — ABNORMAL HIGH (ref 19–32)
Calcium: 9.3 mg/dL (ref 8.4–10.5)
Chloride: 95 mEq/L — ABNORMAL LOW (ref 96–112)
GFR calc Af Amer: 45 mL/min — ABNORMAL LOW (ref >90)
Glucose, Bld: 110 mg/dL — ABNORMAL HIGH (ref 70–99)
Potassium: 4 mEq/L (ref 3.5–5.1)
Sodium: 141 mEq/L (ref 135–145)

## 2012-07-25 LAB — POCT HEMOGLOBIN-HEMACUE: Hemoglobin: 10.8 g/dL — ABNORMAL LOW (ref 13.0–17.0)

## 2012-07-25 MED ORDER — DARBEPOETIN ALFA-POLYSORBATE 60 MCG/0.3ML IJ SOLN
INTRAMUSCULAR | Status: AC
  Administered 2012-07-25: 60 ug via SUBCUTANEOUS
  Filled 2012-07-25: qty 0.3

## 2012-07-25 MED ORDER — DARBEPOETIN ALFA-POLYSORBATE 500 MCG/ML IJ SOLN
60.0000 ug | INTRAMUSCULAR | Status: DC
  Filled 2012-07-25: qty 1

## 2012-07-26 LAB — IRON AND TIBC: TIBC: 258 ug/dL (ref 215–435)

## 2012-08-22 ENCOUNTER — Encounter (HOSPITAL_COMMUNITY)
Admission: RE | Admit: 2012-08-22 | Discharge: 2012-08-22 | Disposition: A | Discharge status: Home / Self Care | Payer: Medicare Other | Source: Ambulatory Visit | Attending: Nephrology | Admitting: Nephrology

## 2012-08-22 DIAGNOSIS — N183 Chronic kidney disease, stage 3 unspecified: Secondary | ICD-10-CM | POA: Insufficient documentation

## 2012-08-22 DIAGNOSIS — D638 Anemia in other chronic diseases classified elsewhere: Secondary | ICD-10-CM | POA: Insufficient documentation

## 2012-08-22 LAB — RENAL FUNCTION PANEL
Albumin: 3.8 g/dL (ref 3.5–5.2)
BUN: 49 mg/dL — ABNORMAL HIGH (ref 6–23)
Calcium: 9.7 mg/dL (ref 8.4–10.5)
Creatinine, Ser: 1.87 mg/dL — ABNORMAL HIGH (ref 0.50–1.35)
Glucose, Bld: 143 mg/dL — ABNORMAL HIGH (ref 70–99)
Phosphorus: 2 mg/dL — ABNORMAL LOW (ref 2.3–4.6)

## 2012-08-22 LAB — POCT HEMOGLOBIN-HEMACUE: Hemoglobin: 10.4 g/dL — ABNORMAL LOW (ref 13.0–17.0)

## 2012-08-22 MED ORDER — DARBEPOETIN ALFA-POLYSORBATE 60 MCG/0.3ML IJ SOLN
INTRAMUSCULAR | Status: AC
  Administered 2012-08-22: 60 ug via SUBCUTANEOUS
  Filled 2012-08-22: qty 0.3

## 2012-08-22 MED ORDER — DARBEPOETIN ALFA-POLYSORBATE 500 MCG/ML IJ SOLN
60.0000 ug | INTRAMUSCULAR | Status: DC
  Filled 2012-08-22: qty 1

## 2012-08-22 NOTE — Progress Notes (Signed)
Main lab called and reported high CO2 level of 44 after patient already left.  I called [facility] and reported the lab to [employer] CMA

## 2012-09-03 ENCOUNTER — Ambulatory Visit (HOSPITAL_COMMUNITY)
Admission: RE | Admit: 2012-09-03 | Discharge: 2012-09-03 | Disposition: A | Discharge status: Home / Self Care | Payer: Medicare Other | Source: Ambulatory Visit | Attending: Cardiovascular Disease | Admitting: Cardiovascular Disease

## 2012-09-03 ENCOUNTER — Other Ambulatory Visit (HOSPITAL_COMMUNITY): Payer: Self-pay | Admitting: Cardiology

## 2012-09-03 DIAGNOSIS — R609 Edema, unspecified: Secondary | ICD-10-CM

## 2012-09-03 NOTE — Progress Notes (Signed)
Right lower extremity venous duplex completed. Negative for DVT. [name] [name]

## 2012-09-06 ENCOUNTER — Other Ambulatory Visit (HOSPITAL_COMMUNITY): Payer: Self-pay | Admitting: Cardiology

## 2012-09-06 DIAGNOSIS — R0602 Shortness of breath: Secondary | ICD-10-CM

## 2012-09-06 DIAGNOSIS — R609 Edema, unspecified: Secondary | ICD-10-CM

## 2012-09-12 ENCOUNTER — Other Ambulatory Visit (HOSPITAL_COMMUNITY): Payer: Self-pay | Admitting: Cardiology

## 2012-09-12 DIAGNOSIS — E119 Type 2 diabetes mellitus without complications: Secondary | ICD-10-CM

## 2012-09-12 DIAGNOSIS — R0602 Shortness of breath: Secondary | ICD-10-CM

## 2012-09-12 DIAGNOSIS — R079 Chest pain, unspecified: Secondary | ICD-10-CM

## 2012-09-12 DIAGNOSIS — R609 Edema, unspecified: Secondary | ICD-10-CM

## 2012-09-17 ENCOUNTER — Ambulatory Visit (HOSPITAL_COMMUNITY)
Admission: RE | Admit: 2012-09-17 | Discharge: 2012-09-17 | Disposition: A | Discharge status: Home / Self Care | Payer: Medicare Other | Source: Ambulatory Visit | Attending: Cardiovascular Disease | Admitting: Cardiovascular Disease

## 2012-09-17 DIAGNOSIS — R0602 Shortness of breath: Secondary | ICD-10-CM

## 2012-09-17 DIAGNOSIS — E119 Type 2 diabetes mellitus without complications: Secondary | ICD-10-CM | POA: Insufficient documentation

## 2012-09-17 DIAGNOSIS — R0989 Other specified symptoms and signs involving the circulatory and respiratory systems: Secondary | ICD-10-CM | POA: Insufficient documentation

## 2012-09-17 DIAGNOSIS — R609 Edema, unspecified: Secondary | ICD-10-CM

## 2012-09-17 DIAGNOSIS — R079 Chest pain, unspecified: Secondary | ICD-10-CM

## 2012-09-17 DIAGNOSIS — I10 Essential (primary) hypertension: Secondary | ICD-10-CM | POA: Insufficient documentation

## 2012-09-17 DIAGNOSIS — R0609 Other forms of dyspnea: Secondary | ICD-10-CM | POA: Insufficient documentation

## 2012-09-17 DIAGNOSIS — R42 Dizziness and giddiness: Secondary | ICD-10-CM | POA: Insufficient documentation

## 2012-09-17 MED ORDER — TECHNETIUM TC 99M SESTAMIBI GENERIC - CARDIOLITE
30.0000 | Freq: Once | INTRAVENOUS | Status: AC | PRN
  Administered 2012-09-17: 30 via INTRAVENOUS

## 2012-09-17 MED ORDER — TECHNETIUM TC 99M SESTAMIBI GENERIC - CARDIOLITE
10.0000 | Freq: Once | INTRAVENOUS | Status: AC | PRN
  Administered 2012-09-17: 10 via INTRAVENOUS

## 2012-09-17 MED ORDER — SODIUM CHLORIDE 0.9 % IV SOLN
30.0000 ug/kg | Freq: Once | INTRAVENOUS | Status: AC
  Administered 2012-09-17: 30 ug/kg/min via INTRAVENOUS

## 2012-09-17 NOTE — Procedures (Addendum)
[facility] [address] [place] [address] [phone]  Cardiology Nuclear Med Study  [name] [name] is a 62 y.o. male     MRN : [id number]     DOB: 15-Sep-1950  Procedure Date: 09/17/2012  Nuclear Med Background Indication for Stress Test:  Graft Patency History:  CABG 2001, Stent Placement 2005, 2011 Cardiac Risk Factors: Hypertension, Lipids, Obesity and Diabetic  Symptoms:  Chest Pain, DOE, Light-Headedness and SOB   Nuclear Pre-Procedure Caffeine/Decaff Intake:  1:00am NPO After: 11:00AM   IV Site: R Antecubital  IV 0.9% NS with Angio Cath:  22g  Chest Size (in):  3X IV Started by: Koren Shiver, CNMT  Height: 6\' 1"  (1.854 m)  Cup Size: n/a  BMI:  Body mass index is 40.90 kg/(m^2). Weight:  310 lb (140.615 kg)   Tech Comments:  Patient held Metoprolol and Imdur only 12 hours prior to testing    Nuclear Med Study 1 or 2 day study: 1 day  Stress Test Type:  Dobutamine  Order Authorizing Provider:  Nanetta Batty, MD   Resting Radionuclide: Technetium 56m Sestamibi  Resting Radionuclide Dose: 11.0 mCi   Stress Radionuclide:  Technetium 64m Sestamibi  Stress Radionuclide Dose: 31.0 mCi           Stress Protocol Rest HR: 92 Stress HR: 144  Rest BP:158/89 Stress BP: 167/91  Exercise Time (min): n/a METS: n/a          Dose of Adenosine (mg):  n/a Dose of Lexiscan: n/a mg  Dose of Atropine (mg): n/a Dose of Dobutamine: 30 mcg/kg/min (at max HR)  Stress Test Technologist: Ernestene Mention, CCT Nuclear Technologist: Gonzella Lex, CNMT   Rest Procedure:  Myocardial perfusion imaging was performed at rest 45 minutes following the intravenous administration of Technetium 2m Sestamibi. Stress Procedure:  The patient received IV dobutamine and no IV atropine.  There were no significant changes with infusion.  Technetium 62m Sestamibi was injected at peak heart rate and quantitative spect images were obtained after a  45 minute delay.  Transient Ischemic Dilatation (Normal <1.22):  0.98 Lung/Heart Ratio (Normal <0.45):  0.38 QGS EDV:  145 ml QGS ESV:  55 ml LV Ejection Fraction: 62%  Signed by [place] Cellar. [place], CNMT  PHYSICIAN INTERPRETATION:  Rest ECG: NSR-RBBB and NS ST-T changes.  Stress ECG: Significant ST abnormalities consistent with ischemia. and difficult to interpret.  The ST segments of Leads V4-V4 has increased ~36mm segmental depressions that are somewhat difficult to interpret in the setting of RBBB with extensive ST-T wave abnormalities at baseline.  QPS Raw Data Images:  There is interference from nuclear activity from structures below the diaphragm. This does not affect the ability to read the study. Mild diaphragmatic attenuation.  Normal left ventricular size. Stress Images:  There is decreased uptake in the inferior wall. Rest Images:  Comparison with the stress images reveals no significant change. Subtraction (SDS):  There is a fixed inferior defect that is most consistent with diaphragmatic attenuation.  Impression Exercise Capacity:  Dobutamine study with no exercise. BP Response:  Normal blood pressure response. Clinical Symptoms:  No significant symptoms noted. ECG Impression:  Significant ST abnormalities that are suggestive of ischemia. LV Wall Motion:  NL LV Function; NL Wall Motion  Comparison with Prior Nuclear Study: No significant change from previous study  Overall Impression:  Low risk stress nuclear study.   Abnormal ECG response to Dobutamine without Signs/Symptoms of Angina or Dyspnea.  Normal nuclear imaging study with diaphragmatic attenuation.   [name], MD  09/17/2012 6:36 PM

## 2012-09-19 ENCOUNTER — Encounter (HOSPITAL_COMMUNITY): Payer: Medicare Other

## 2012-09-20 ENCOUNTER — Telehealth: Payer: Self-pay | Admitting: Internal Medicine

## 2012-09-20 NOTE — Telephone Encounter (Signed)
I spoke with pt and is aware of CDY recs. He voiced his understanding and needed nothing further

## 2012-09-20 NOTE — Telephone Encounter (Signed)
Called, spoke with pt.  C/o prod cough with clear phlegm, occas runny nose, chest congestion, fatigue, and increased SOB when outside.  Denies wheezing, chest tightness, chest pain, f/c/s, or body aches.  Reports symptoms started last week.  Has used cough drops along with maintenance meds.  Would like to know if abx would help or any other recs CY may have to bbreak up the phlegm.  Dr. Maple Hudson, pls advise.  Thank you.  [facility]  Last OV with Dr. Maple Hudson 03/28/12 and asked to f/u in 6 months  Pending OV with Dr Maple Hudson 10/03/12  Allergies verified with pt: Allergies  Allergen Reactions  . Amlodipine Besy-Benazepril Hcl Swelling    Lips swell  . Percocet (Oxycodone-Acetaminophen) Other (See Comments)    hallucinations

## 2012-09-20 NOTE — Telephone Encounter (Signed)
Sounds like viral infection. Would not expect antibiotic to help. Suggest Mucinex to break up the mucus.

## 2012-09-23 ENCOUNTER — Encounter (HOSPITAL_COMMUNITY): Payer: Medicare Other

## 2012-09-30 ENCOUNTER — Encounter (HOSPITAL_COMMUNITY)
Admission: RE | Admit: 2012-09-30 | Discharge: 2012-09-30 | Disposition: A | Discharge status: Home / Self Care | Payer: Medicare Other | Source: Ambulatory Visit | Attending: Nephrology | Admitting: Nephrology

## 2012-09-30 DIAGNOSIS — D638 Anemia in other chronic diseases classified elsewhere: Secondary | ICD-10-CM | POA: Insufficient documentation

## 2012-09-30 DIAGNOSIS — N183 Chronic kidney disease, stage 3 unspecified: Secondary | ICD-10-CM | POA: Insufficient documentation

## 2012-09-30 LAB — IRON AND TIBC
Saturation Ratios: 17 % — ABNORMAL LOW (ref 20–55)
UIBC: 217 ug/dL (ref 125–400)

## 2012-09-30 LAB — RENAL FUNCTION PANEL
Albumin: 3.6 g/dL (ref 3.5–5.2)
BUN: 36 mg/dL — ABNORMAL HIGH (ref 6–23)
GFR calc non Af Amer: 38 mL/min — ABNORMAL LOW (ref >90)
Phosphorus: 2.4 mg/dL (ref 2.3–4.6)
Potassium: 4.1 mEq/L (ref 3.5–5.1)

## 2012-09-30 LAB — FERRITIN: Ferritin: 588 ng/mL — ABNORMAL HIGH (ref 22–322)

## 2012-09-30 LAB — POCT HEMOGLOBIN-HEMACUE: Hemoglobin: 9.6 g/dL — ABNORMAL LOW (ref 13.0–17.0)

## 2012-09-30 MED ORDER — DARBEPOETIN ALFA-POLYSORBATE 60 MCG/0.3ML IJ SOLN
INTRAMUSCULAR | Status: AC
  Administered 2012-09-30: 60 ug via SUBCUTANEOUS
  Filled 2012-09-30: qty 0.3

## 2012-09-30 MED ORDER — DARBEPOETIN ALFA-POLYSORBATE 500 MCG/ML IJ SOLN
60.0000 ug | INTRAMUSCULAR | Status: DC
  Filled 2012-09-30: qty 1

## 2012-10-03 ENCOUNTER — Telehealth: Payer: Self-pay | Admitting: Internal Medicine

## 2012-10-03 ENCOUNTER — Ambulatory Visit: Payer: Medicare Other | Admitting: Internal Medicine

## 2012-10-03 NOTE — Telephone Encounter (Signed)
Per CY-we can not work him back in today and he can contact his PCP for his cough; then follow up with [place] in next open slot.

## 2012-10-03 NOTE — Telephone Encounter (Signed)
Spoke with pt and notified of recs per CDY He verbalized understanding and I have scheduled him to see CDY for first available  He will see his PCP in the meantime

## 2012-10-20 ENCOUNTER — Inpatient Hospital Stay (HOSPITAL_COMMUNITY)
Admission: EM | Admit: 2012-10-20 | Discharge: 2012-10-23 | DRG: 291 | Disposition: A | Discharge status: Home / Self Care | Payer: Medicare Other | Attending: Internal Medicine | Admitting: Internal Medicine

## 2012-10-20 ENCOUNTER — Encounter (HOSPITAL_COMMUNITY): Payer: Self-pay | Admitting: *Deleted

## 2012-10-20 ENCOUNTER — Emergency Department (HOSPITAL_COMMUNITY): Payer: Medicare Other

## 2012-10-20 DIAGNOSIS — I279 Pulmonary heart disease, unspecified: Secondary | ICD-10-CM | POA: Diagnosis present

## 2012-10-20 DIAGNOSIS — I129 Hypertensive chronic kidney disease with stage 1 through stage 4 chronic kidney disease, or unspecified chronic kidney disease: Secondary | ICD-10-CM | POA: Diagnosis present

## 2012-10-20 DIAGNOSIS — N189 Chronic kidney disease, unspecified: Secondary | ICD-10-CM

## 2012-10-20 DIAGNOSIS — J9620 Acute and chronic respiratory failure, unspecified whether with hypoxia or hypercapnia: Secondary | ICD-10-CM | POA: Diagnosis present

## 2012-10-20 DIAGNOSIS — Z951 Presence of aortocoronary bypass graft: Secondary | ICD-10-CM

## 2012-10-20 DIAGNOSIS — J9611 Chronic respiratory failure with hypoxia: Secondary | ICD-10-CM | POA: Diagnosis present

## 2012-10-20 DIAGNOSIS — Z7982 Long term (current) use of aspirin: Secondary | ICD-10-CM

## 2012-10-20 DIAGNOSIS — K219 Gastro-esophageal reflux disease without esophagitis: Secondary | ICD-10-CM | POA: Diagnosis present

## 2012-10-20 DIAGNOSIS — E119 Type 2 diabetes mellitus without complications: Secondary | ICD-10-CM | POA: Diagnosis present

## 2012-10-20 DIAGNOSIS — I5033 Acute on chronic diastolic (congestive) heart failure: Principal | ICD-10-CM | POA: Diagnosis present

## 2012-10-20 DIAGNOSIS — E662 Morbid (severe) obesity with alveolar hypoventilation: Secondary | ICD-10-CM | POA: Diagnosis present

## 2012-10-20 DIAGNOSIS — I2510 Atherosclerotic heart disease of native coronary artery without angina pectoris: Secondary | ICD-10-CM | POA: Diagnosis present

## 2012-10-20 DIAGNOSIS — Z79899 Other long term (current) drug therapy: Secondary | ICD-10-CM

## 2012-10-20 DIAGNOSIS — J4489 Other specified chronic obstructive pulmonary disease: Secondary | ICD-10-CM | POA: Diagnosis present

## 2012-10-20 DIAGNOSIS — Z87891 Personal history of nicotine dependence: Secondary | ICD-10-CM

## 2012-10-20 DIAGNOSIS — D649 Anemia, unspecified: Secondary | ICD-10-CM | POA: Diagnosis present

## 2012-10-20 DIAGNOSIS — M129 Arthropathy, unspecified: Secondary | ICD-10-CM | POA: Diagnosis present

## 2012-10-20 DIAGNOSIS — I509 Heart failure, unspecified: Secondary | ICD-10-CM

## 2012-10-20 DIAGNOSIS — G4733 Obstructive sleep apnea (adult) (pediatric): Secondary | ICD-10-CM | POA: Diagnosis present

## 2012-10-20 DIAGNOSIS — F411 Generalized anxiety disorder: Secondary | ICD-10-CM | POA: Diagnosis present

## 2012-10-20 DIAGNOSIS — Z6841 Body Mass Index (BMI) 40.0 and over, adult: Secondary | ICD-10-CM

## 2012-10-20 DIAGNOSIS — J9601 Acute respiratory failure with hypoxia: Secondary | ICD-10-CM | POA: Diagnosis present

## 2012-10-20 DIAGNOSIS — E669 Obesity, unspecified: Secondary | ICD-10-CM | POA: Diagnosis present

## 2012-10-20 DIAGNOSIS — N289 Disorder of kidney and ureter, unspecified: Secondary | ICD-10-CM | POA: Diagnosis present

## 2012-10-20 DIAGNOSIS — Z9861 Coronary angioplasty status: Secondary | ICD-10-CM

## 2012-10-20 DIAGNOSIS — N183 Chronic kidney disease, stage 3 unspecified: Secondary | ICD-10-CM | POA: Diagnosis present

## 2012-10-20 DIAGNOSIS — Z7902 Long term (current) use of antithrombotics/antiplatelets: Secondary | ICD-10-CM

## 2012-10-20 LAB — CBC WITH DIFFERENTIAL/PLATELET
Basophils Absolute: 0 10*3/uL (ref 0.0–0.1)
Basophils Relative: 1 % (ref 0–1)
Eosinophils Absolute: 0.1 K/uL (ref 0.0–0.7)
Eosinophils Relative: 2 % (ref 0–5)
HCT: 32.9 % — ABNORMAL LOW (ref 39.0–52.0)
Hemoglobin: 9.7 g/dL — ABNORMAL LOW (ref 13.0–17.0)
Lymphocytes Relative: 25 % (ref 12–46)
Lymphs Abs: 1.3 K/uL (ref 0.7–4.0)
MCH: 28.3 pg (ref 26.0–34.0)
MCHC: 29.5 g/dL — ABNORMAL LOW (ref 30.0–36.0)
MCV: 95.9 fL (ref 78.0–100.0)
Monocytes Absolute: 0.6 10*3/uL (ref 0.1–1.0)
Monocytes Relative: 10 % (ref 3–12)
Neutro Abs: 3.3 10*3/uL (ref 1.7–7.7)
Neutrophils Relative %: 62 % (ref 43–77)
Platelets: 136 10*3/uL — ABNORMAL LOW (ref 150–400)
RBC: 3.43 MIL/uL — ABNORMAL LOW (ref 4.22–5.81)
RDW: 14.3 % (ref 11.5–15.5)
WBC: 5.3 K/uL (ref 4.0–10.5)

## 2012-10-20 LAB — COMPREHENSIVE METABOLIC PANEL
ALT: 55 U/L — ABNORMAL HIGH (ref 0–53)
AST: 36 U/L (ref 0–37)
CO2: >45 mEq/L (ref 19–32)
Calcium: 9.5 mg/dL (ref 8.4–10.5)
Chloride: 93 mEq/L — ABNORMAL LOW (ref 96–112)
GFR calc Af Amer: 38 mL/min — ABNORMAL LOW (ref >90)
GFR calc non Af Amer: 33 mL/min — ABNORMAL LOW (ref >90)
Glucose, Bld: 135 mg/dL — ABNORMAL HIGH (ref 70–99)
Sodium: 145 mEq/L (ref 135–145)
Total Bilirubin: 0.3 mg/dL (ref 0.3–1.2)

## 2012-10-20 LAB — BLOOD GAS, ARTERIAL
Acid-Base Excess: 16.6 mmol/L — ABNORMAL HIGH (ref 0.0–2.0)
Drawn by: 280981
O2 Content: 3 L/min
Patient temperature: 98.6
TCO2: 48.2 mmol/L (ref 0–100)
pH, Arterial: 7.22 — ABNORMAL LOW (ref 7.350–7.450)

## 2012-10-20 LAB — COMPREHENSIVE METABOLIC PANEL WITH GFR
Albumin: 3.8 g/dL (ref 3.5–5.2)
Alkaline Phosphatase: 76 U/L (ref 39–117)
BUN: 49 mg/dL — ABNORMAL HIGH (ref 6–23)
Creatinine, Ser: 2.07 mg/dL — ABNORMAL HIGH (ref 0.50–1.35)
Potassium: 3.7 meq/L (ref 3.5–5.1)
Total Protein: 7.5 g/dL (ref 6.0–8.3)

## 2012-10-20 LAB — CBC
HCT: 36.4 % — ABNORMAL LOW (ref 39.0–52.0)
RBC: 3.7 MIL/uL — ABNORMAL LOW (ref 4.22–5.81)
RDW: 14 % (ref 11.5–15.5)
WBC: 4.9 10*3/uL (ref 4.0–10.5)

## 2012-10-20 LAB — CREATININE, SERUM
GFR calc Af Amer: 39 mL/min — ABNORMAL LOW (ref >90)
GFR calc non Af Amer: 34 mL/min — ABNORMAL LOW (ref >90)

## 2012-10-20 LAB — POCT I-STAT 3, ART BLOOD GAS (G3+)
pCO2 arterial: 79.9 mmHg (ref 35.0–45.0)
pH, Arterial: 7.369 (ref 7.350–7.450)
pO2, Arterial: 61 mmHg — ABNORMAL LOW (ref 80.0–100.0)

## 2012-10-20 LAB — MRSA PCR SCREENING: MRSA by PCR: NEGATIVE

## 2012-10-20 LAB — PRO B NATRIURETIC PEPTIDE: Pro B Natriuretic peptide (BNP): 448 pg/mL — ABNORMAL HIGH (ref 0–125)

## 2012-10-20 LAB — TROPONIN I: Troponin I: <0.3 ng/mL (ref <0.30)

## 2012-10-20 MED ORDER — TAMSULOSIN HCL 0.4 MG PO CAPS
0.4000 mg | ORAL_CAPSULE | Freq: Every day | ORAL | Status: DC
  Administered 2012-10-20 – 2012-10-23 (×4): 0.4 mg via ORAL
  Filled 2012-10-20 (×4): qty 1

## 2012-10-20 MED ORDER — ACETAMINOPHEN 325 MG PO TABS: 650.0000 mg | ORAL_TABLET | Freq: Four times a day (QID) | ORAL | Status: DC | PRN

## 2012-10-20 MED ORDER — TIOTROPIUM BROMIDE MONOHYDRATE 18 MCG IN CAPS: 18.0000 ug | ORAL_CAPSULE | Freq: Every day | RESPIRATORY_TRACT | Status: DC

## 2012-10-20 MED ORDER — METOPROLOL TARTRATE 12.5 MG HALF TABLET
12.5000 mg | ORAL_TABLET | Freq: Two times a day (BID) | ORAL | Status: DC
  Administered 2012-10-20 – 2012-10-23 (×6): 12.5 mg via ORAL
  Filled 2012-10-20 (×8): qty 1

## 2012-10-20 MED ORDER — ACETAMINOPHEN 650 MG RE SUPP: 650.0000 mg | Freq: Four times a day (QID) | RECTAL | Status: DC | PRN

## 2012-10-20 MED ORDER — COLCHICINE 0.6 MG PO TABS
0.6000 mg | ORAL_TABLET | Freq: Every day | ORAL | Status: DC
  Administered 2012-10-20 – 2012-10-23 (×4): 0.6 mg via ORAL
  Filled 2012-10-20 (×4): qty 1

## 2012-10-20 MED ORDER — METOLAZONE 2.5 MG PO TABS
2.5000 mg | ORAL_TABLET | Freq: Every day | ORAL | Status: DC
  Administered 2012-10-20 – 2012-10-21 (×2): 2.5 mg via ORAL
  Filled 2012-10-20 (×2): qty 1

## 2012-10-20 MED ORDER — CLOPIDOGREL BISULFATE 75 MG PO TABS
75.0000 mg | ORAL_TABLET | Freq: Every day | ORAL | Status: DC
  Administered 2012-10-20 – 2012-10-23 (×4): 75 mg via ORAL
  Filled 2012-10-20 (×4): qty 1

## 2012-10-20 MED ORDER — FUROSEMIDE 10 MG/ML IJ SOLN
80.0000 mg | Freq: Once | INTRAMUSCULAR | Status: AC
  Administered 2012-10-20: 80 mg via INTRAVENOUS
  Filled 2012-10-20: qty 8

## 2012-10-20 MED ORDER — METHYLPREDNISOLONE SODIUM SUCC 125 MG IJ SOLR
60.0000 mg | Freq: Two times a day (BID) | INTRAMUSCULAR | Status: DC
  Administered 2012-10-20 – 2012-10-21 (×2): 60 mg via INTRAVENOUS
  Filled 2012-10-20: qty 0.96
  Filled 2012-10-20: qty 2
  Filled 2012-10-20 (×2): qty 0.96

## 2012-10-20 MED ORDER — ATORVASTATIN CALCIUM 10 MG PO TABS
10.0000 mg | ORAL_TABLET | Freq: Every day | ORAL | Status: DC
  Administered 2012-10-20 – 2012-10-22 (×3): 10 mg via ORAL
  Filled 2012-10-20 (×4): qty 1

## 2012-10-20 MED ORDER — SODIUM CHLORIDE 0.9 % IV SOLN: 250.0000 mL | INTRAVENOUS | Status: DC | PRN

## 2012-10-20 MED ORDER — ONDANSETRON HCL 4 MG PO TABS: 4.0000 mg | ORAL_TABLET | Freq: Four times a day (QID) | ORAL | Status: DC | PRN

## 2012-10-20 MED ORDER — ONDANSETRON HCL 4 MG/2ML IJ SOLN: 4.0000 mg | Freq: Four times a day (QID) | INTRAMUSCULAR | Status: DC | PRN

## 2012-10-20 MED ORDER — SODIUM CHLORIDE 0.9 % IJ SOLN: 3.0000 mL | INTRAMUSCULAR | Status: DC | PRN

## 2012-10-20 MED ORDER — SODIUM CHLORIDE 0.9 % IJ SOLN
3.0000 mL | Freq: Two times a day (BID) | INTRAMUSCULAR | Status: DC
  Administered 2012-10-20 – 2012-10-21 (×2): 3 mL via INTRAVENOUS

## 2012-10-20 MED ORDER — ENOXAPARIN SODIUM 30 MG/0.3ML ~~LOC~~ SOLN
30.0000 mg | SUBCUTANEOUS | Status: DC
  Administered 2012-10-20: 30 mg via SUBCUTANEOUS
  Filled 2012-10-20 (×2): qty 0.3

## 2012-10-20 MED ORDER — ALBUTEROL SULFATE (5 MG/ML) 0.5% IN NEBU
2.5000 mg | INHALATION_SOLUTION | RESPIRATORY_TRACT | Status: DC
  Administered 2012-10-20 (×2): 2.5 mg via RESPIRATORY_TRACT
  Filled 2012-10-20 (×2): qty 0.5

## 2012-10-20 MED ORDER — ALBUTEROL SULFATE (5 MG/ML) 0.5% IN NEBU
2.5000 mg | INHALATION_SOLUTION | Freq: Four times a day (QID) | RESPIRATORY_TRACT | Status: DC
  Administered 2012-10-21 – 2012-10-22 (×5): 2.5 mg via RESPIRATORY_TRACT
  Filled 2012-10-20 (×7): qty 0.5

## 2012-10-20 MED ORDER — ISOSORBIDE MONONITRATE ER 30 MG PO TB24
45.0000 mg | ORAL_TABLET | Freq: Every day | ORAL | Status: DC
  Administered 2012-10-20 – 2012-10-23 (×4): 45 mg via ORAL
  Filled 2012-10-20 (×4): qty 1

## 2012-10-20 MED ORDER — LEVALBUTEROL HCL 0.63 MG/3ML IN NEBU: 0.6300 mg | INHALATION_SOLUTION | Freq: Four times a day (QID) | RESPIRATORY_TRACT | Status: DC | PRN

## 2012-10-20 MED ORDER — LEVOFLOXACIN IN D5W 750 MG/150ML IV SOLN
750.0000 mg | INTRAVENOUS | Status: DC
  Administered 2012-10-20 – 2012-10-22 (×2): 750 mg via INTRAVENOUS
  Filled 2012-10-20 (×2): qty 150

## 2012-10-20 MED ORDER — PANTOPRAZOLE SODIUM 40 MG PO TBEC
40.0000 mg | DELAYED_RELEASE_TABLET | Freq: Every day | ORAL | Status: DC
  Administered 2012-10-20 – 2012-10-23 (×4): 40 mg via ORAL
  Filled 2012-10-20 (×3): qty 1

## 2012-10-20 MED ORDER — ASPIRIN EC 81 MG PO TBEC
81.0000 mg | DELAYED_RELEASE_TABLET | Freq: Every day | ORAL | Status: DC
  Administered 2012-10-20 – 2012-10-23 (×4): 81 mg via ORAL
  Filled 2012-10-20 (×4): qty 1

## 2012-10-20 MED ORDER — NITROGLYCERIN 2 % TD OINT
1.0000 [in_us] | TOPICAL_OINTMENT | Freq: Four times a day (QID) | TRANSDERMAL | Status: DC
  Administered 2012-10-20 – 2012-10-22 (×8): 1 [in_us] via TOPICAL
  Filled 2012-10-20: qty 30

## 2012-10-20 MED ORDER — MOMETASONE FURO-FORMOTEROL FUM 100-5 MCG/ACT IN AERO
2.0000 | INHALATION_SPRAY | Freq: Two times a day (BID) | RESPIRATORY_TRACT | Status: DC
  Administered 2012-10-20 – 2012-10-21 (×2): 2 via RESPIRATORY_TRACT
  Filled 2012-10-20: qty 8.8

## 2012-10-20 MED ORDER — SODIUM CHLORIDE 0.9 % IJ SOLN: 3.0000 mL | Freq: Two times a day (BID) | INTRAMUSCULAR | Status: DC

## 2012-10-20 MED ORDER — IPRATROPIUM BROMIDE 0.02 % IN SOLN
0.5000 mg | RESPIRATORY_TRACT | Status: DC
  Administered 2012-10-20 – 2012-10-21 (×6): 0.5 mg via RESPIRATORY_TRACT
  Filled 2012-10-20 (×6): qty 2.5

## 2012-10-20 MED ORDER — FUROSEMIDE 10 MG/ML IJ SOLN
40.0000 mg | Freq: Two times a day (BID) | INTRAMUSCULAR | Status: DC
  Administered 2012-10-20 – 2012-10-21 (×2): 40 mg via INTRAVENOUS
  Filled 2012-10-20 (×4): qty 4

## 2012-10-20 NOTE — Progress Notes (Signed)
ANTIBIOTIC CONSULT NOTE - INITIAL  Pharmacy Consult for levofloxacin Indication: pneumonia  Allergies  Allergen Reactions  . Amlodipine Besy-Benazepril Hcl Swelling    Lips swell  . Percocet (Oxycodone-Acetaminophen) Other (See Comments)    hallucinations    Patient Measurements:     Vital Signs: Temp: 98.4 F (36.9 C) (03/09 1058) Temp src: Oral (03/09 1058) BP: 120/54 mmHg (03/09 1058) Pulse Rate: 96 (03/09 0931) Intake/Output from previous day:   Intake/Output from this shift:    Labs:  Recent Labs  10/20/12 1009  WBC 5.3  HGB 9.7*  PLT 136*  CREATININE 2.07*   The CrCl is unknown because both a height and weight (above a minimum accepted value) are required for this calculation. No results found for this basename: VANCOTROUGH, VANCOPEAK, VANCORANDOM, GENTTROUGH, GENTPEAK, GENTRANDOM, TOBRATROUGH, TOBRAPEAK, TOBRARND, AMIKACINPEAK, AMIKACINTROU, AMIKACIN,  in the last 72 hours   Microbiology: No results found for this or any previous visit (from the past 720 hour(s)).  Medical History: Past Medical History  Diagnosis Date  . Heart disease, unspecified   . Coronary atherosclerosis of unspecified type of vessel, native or graft   . Chronic pulmonary heart disease, unspecified   . Other hyperalimentation   . Chronic respiratory failure   . Unspecified sleep apnea   . Chronic airway obstruction, not elsewhere classified   . Angina   . Shortness of breath   . Anemia   . Chronic kidney disease   . Anxiety   . Hypertension   . CHF (congestive heart failure)   . Blood transfusion     no reaction from transfusion  . Arthritis     hx of gout  . Diabetes mellitus   . CORONARY ARTERY DISEASE 10/28/2007  . CPAP (continuous positive airway pressure) dependence   . GERD (gastroesophageal reflux disease)     Medications:  Scheduled:  . ipratropium  0.5 mg Nebulization Q4H   And  . albuterol  2.5 mg Nebulization Q4H  . [COMPLETED] furosemide  80 mg  Intravenous Once  . methylPREDNISolone (SOLU-MEDROL) injection  60 mg Intravenous Q12H   Infusions:  . levofloxacin (LEVAQUIN) IV     Assessment: 62 yo M admitted with suspected PNA.  Goal of Therapy:  Eradication of infection  Plan:  - Levofloxacin 750 mg IV q48h - Follow up SCr, UOP, cultures, clinical course and adjust as clinically indicated.  Shanan Fitzpatrick L. Illene Bolus, PharmD, BCPS Clinical Pharmacist Pager: [phone] Pharmacy: [phone] 10/20/2012 1:27 PM

## 2012-10-20 NOTE — ED Notes (Signed)
Dr. Oletta Lamas notified that pt's CO2 is greater than 45.  Dr. Oletta Lamas also given EKG.

## 2012-10-20 NOTE — ED Notes (Signed)
[phone].  Pt's wife is going home to see if power is on and then she will be back.

## 2012-10-20 NOTE — H&P (Signed)
[facility] History and Physical  [name] [name] UJW:[id number] DOB: 1951/05/24 DOA: 10/20/2012  Referring physician: Gavin Pound. Ghim, MD  PCP: Altamese [place], MD   Chief Complaint: *Shortness of Breath   HPI:  62 y.o. male presenting with shortness of breath ,hx of COPD, CAD/diastolic dysfunction, OSA, chronic respiratory failure,obesity Hypoventilation, cor pulmonale, CKD stage 3 , gradually worsening shortness of breath over the last few weeks. He reports that he had a stress test with cardiologist approximately month ago and did well. He reports he's had worsening cough with productive white sputum. He reports positive orthopnea as well as paroxysmal nocturnal dyspnea. He uses 3 L of nasal cannula oxygen normally at home and on occasion has had to use slightly more. He reports no worse lower extremity swelling, but has been gaining weight. He currently takes 80 mg of furosemide orally twice a day. He reports he thinks he is actually urinating more frequently but continues to feel short of breath         Review of Systems: negative for the following  Constitutional: Positive for unexpected weight change. Negative for fever, chills, diaphoresis and appetite change.  HENT: Negative for congestion and rhinorrhea.  Respiratory: Positive for cough and shortness of breath. Negative for chest tightness and wheezing Cardiovascular: Denies chest pain, palpitations and leg swelling.  Gastrointestinal: Denies nausea, vomiting, abdominal pain, diarrhea, constipation, blood in stool and abdominal distention.  Genitourinary: Denies dysuria, urgency, frequency, hematuria, flank pain and difficulty urinating.  Musculoskeletal: Denies myalgias, back pain, joint swelling, arthralgias and gait problem.  Skin: Denies pallor, rash and wound.  Neurological: Denies dizziness, seizures, syncope, weakness, light-headedness, numbness and headaches.  Hematological: Denies adenopathy. Easy bruising,  personal or family bleeding history  Psychiatric/Behavioral: Denies suicidal ideation, mood changes, confusion, nervousness, sleep disturbance and agitation       Past Medical History  Diagnosis Date  . Heart disease, unspecified   . Coronary atherosclerosis of unspecified type of vessel, native or graft   . Chronic pulmonary heart disease, unspecified   . Other hyperalimentation   . Chronic respiratory failure   . Unspecified sleep apnea   . Chronic airway obstruction, not elsewhere classified   . Angina   . Shortness of breath   . Anemia   . Chronic kidney disease   . Anxiety   . Hypertension   . CHF (congestive heart failure)   . Blood transfusion     no reaction from transfusion  . Arthritis     hx of gout  . Diabetes mellitus   . CORONARY ARTERY DISEASE 10/28/2007  . CPAP (continuous positive airway pressure) dependence   . GERD (gastroesophageal reflux disease)      Past Surgical History  Procedure Laterality Date  . Coronary artery bypass graft    . Appendectomy    . Bilateral vats ablation    . Coronary angioplasty with stent placement    . Hemmorhoids    . Lung sx  2005    Growth on outside of right lung  . Esophagogastroduodenoscopy  08/11/2011    Procedure: ESOPHAGOGASTRODUODENOSCOPY (EGD);  Surgeon: Barrie Folk, MD;  Location: [facility] ENDOSCOPY;  Service: Endoscopy;  Laterality: N/A;  . Colonoscopy  08/11/2011    Procedure: COLONOSCOPY;  Surgeon: Barrie Folk, MD;  Location[facility] ENDOSCOPY;  Service: Endoscopy;  Laterality: N/A;      Social History:  reports that he quit smoking about 14 years ago. His smoking use included Cigarettes. He has a 15 pack-year smoking history.  He has never used smokeless tobacco. He reports that  drinks alcohol. He reports that he does not use illicit drugs.    Allergies  Allergen Reactions  . Amlodipine Besy-Benazepril Hcl Swelling    Lips swell  . Percocet (Oxycodone-Acetaminophen) Other (See Comments)    hallucinations     Family History  Problem Relation Age of Onset  . Diabetes Sister      Prior to Admission medications   Medication Sig Start Date End Date Taking? Authorizing Provider  albuterol (PROVENTIL HFA;VENTOLIN HFA) 108 (90 BASE) MCG/ACT inhaler Inhale 2 puffs into the lungs every 6 (six) hours as needed. For wheezing   Yes Historical Provider, MD  albuterol (PROVENTIL) (2.5 MG/3ML) 0.083% nebulizer solution Take 3 mLs (2.5 mg total) by nebulization every 6 (six) hours as needed for wheezing or shortness of breath. 03/28/12 03/28/13 Yes Waymon Budge, MD  aspirin EC 81 MG tablet Take 81 mg by mouth daily.   Yes Historical Provider, MD  BAYER CONTOUR TEST test strip  01/24/12  Yes Historical Provider, MD  clopidogrel (PLAVIX) 75 MG tablet Take 75 mg by mouth daily.   Yes Historical Provider, MD  COLCRYS 0.6 MG tablet Take 1 tablet by mouth daily. 01/02/11  Yes Historical Provider, MD  Fluticasone-Salmeterol (ADVAIR) 250-50 MCG/DOSE AEPB Inhale 1 puff into the lungs every 12 (twelve) hours as needed. For wheezing   Yes Historical Provider, MD  furosemide (LASIX) 40 MG tablet Take 80 mg by mouth 2 (two) times daily. 01/23/12  Yes Simonne Martinet, NP  isosorbide mononitrate (IMDUR) 30 MG 24 hr tablet Take 45 mg by mouth daily.    Yes Historical Provider, MD  metoprolol tartrate (LOPRESSOR) 25 MG tablet Take 12.5 mg by mouth 2 (two) times daily. 07/22/11  Yes Luke K Kilroy, PA-C  Multiple Vitamin (MULITIVITAMIN WITH MINERALS) TABS Take 1 tablet by mouth daily.   Yes Historical Provider, MD  nitroGLYCERIN (NITROSTAT) 0.4 MG SL tablet Place 0.4 mg under the tongue every 5 (five) minutes as needed. For chest pain   Yes Historical Provider, MD  pantoprazole (PROTONIX) 40 MG tablet Take 1 tablet by mouth daily. 12/18/10  Yes Historical Provider, MD  rosuvastatin (CRESTOR) 20 MG tablet Take 20 mg by mouth daily.    Yes Historical Provider, MD  Tamsulosin HCl (FLOMAX) 0.4 MG CAPS Take 1 tablet by mouth daily. 12/16/10   Yes Historical Provider, MD  tiotropium (SPIRIVA) 18 MCG inhalation capsule Place 1 capsule (18 mcg total) into inhaler and inhale daily. For wheezing 01/23/12 01/22/13 Yes Simonne Martinet, NP     Physical Exam: Filed Vitals:   10/20/12 0412 10/20/12 0850 10/20/12 0931 10/20/12 1058  BP: 168/72 195/87 132/76 120/54  Pulse:  97 96   Temp: 97.4 F (36.3 C)  99.7 F (37.6 C) 98.4 F (36.9 C)  TempSrc: Oral  Oral Oral  Resp: 22 22 14 26   SpO2: 85% 88% 100% 99%     Constitutional: Vital signs reviewed. Patient is a well-developed and well-nourished in no acute distress and cooperative with exam. Alert and oriented x3.  Head: Normocephalic and atraumatic  Ear: TM normal bilaterally  Mouth: no erythema or exudates, MMM  Eyes: PERRL, EOMI, conjunctivae normal, No scleral icterus.  Neck: Supple, Trachea midline normal ROM, No JVD, mass, thyromegaly, or carotid bruit present.  Cardiovascular: RRR, S1 normal, S2 normal, no MRG, pulses symmetric and intact bilaterally  Pulmonary/Chest: CTAB, no wheezes, rales, or rhonchi  Abdominal: Soft. Non-tender, non-distended, bowel sounds  are normal, no masses, organomegaly, or guarding present.  GU: no CVA tenderness Musculoskeletal: No joint deformities, erythema, or stiffness, ROM full and no nontender Ext: no edema and no cyanosis, pulses palpable bilaterally (DP and PT)  Hematology: no cervical, inginal, or axillary adenopathy.  Neurological: A&O x3, Strenght is normal and symmetric bilaterally, cranial nerve II-XII are grossly intact, no focal motor deficit, sensory intact to light touch bilaterally.  Skin: Warm, dry and intact. No rash, cyanosis, or clubbing.  Psychiatric: Normal mood and affect. speech and behavior is normal. Judgment and thought content normal. Cognition and memory are normal.       Labs on Admission:    Basic Metabolic Panel:  Recent Labs Lab 10/20/12 1009  NA 145  K 3.7  CL 93*  CO2 >45*  GLUCOSE 135*  BUN 49*   CREATININE 2.07*  CALCIUM 9.5   Liver Function Tests:  Recent Labs Lab 10/20/12 1009  AST 36  ALT 55*  ALKPHOS 76  BILITOT 0.3  PROT 7.5  ALBUMIN 3.8   No results found for this basename: LIPASE, AMYLASE,  in the last 168 hours No results found for this basename: AMMONIA,  in the last 168 hours CBC:  Recent Labs Lab 10/20/12 1009  WBC 5.3  NEUTROABS 3.3  HGB 9.7*  HCT 32.9*  MCV 95.9  PLT 136*   Cardiac Enzymes:  Recent Labs Lab 10/20/12 1009  TROPONINI <0.30    BNP (last 3 results)  Recent Labs  01/18/12 0400 01/20/12 0435 10/20/12 1009  PROBNP 423.2* 373.1* 448.0*      CBG: No results found for this basename: GLUCAP,  in the last 168 hours  Radiological Exams on Admission: Dg Chest 2 View  10/20/2012  *RADIOLOGY REPORT*  Clinical Data: Shortness of breath and productive cough.  CHEST - 2 VIEW  Comparison: Chest radiograph performed 03/28/2012  Findings: The lungs are well-aerated.  Small bilateral pleural effusions are again noted.  Bibasilar airspace opacification is seen, with underlying vascular congestion.  Findings are most compatible with pulmonary edema, though underlying pneumonia cannot be excluded.  No pneumothorax is seen.  The heart is enlarged.  The patient is status post median sternotomy, with evidence of prior CABG.  No acute osseous abnormalities are seen.  IMPRESSION: Vascular congestion and cardiomegaly, with small bilateral pleural effusions and bibasilar airspace opacification.  Findings are most compatible with recurrent pulmonary edema, though underlying pneumonia cannot be excluded.   Original Report Authenticated By: Tonia Ghent, M.D.     EKG: EKG at time 10:28, shows sinus rhythm at a rate of 98, right bundle branch block. No acute ST or T wave abnormalities to suggest ischemia. Appearance is very similar to that from 01/14/2012. Interpretation is abnormal EKG with no acute abnormalities   Assessment/Plan Active Problems:    CORONARY ARTERY DISEASE, CABG 2001, last PCI 7/11   DIASTOLIC DYSFUNCTION   C O P D   RESPIRATORY FAILURE, CHRONIC, on BiPap   Obstructive sleep apnea   Acute respiratory failure with hypoxia   1.  CHF (congestive heart failure) Pt's initial O2 sats were 85% on 3L [place] which is likely low by my interpretation. CXR shows edema. Pt's symptoms suggest gradually worsening and acute on chronic CHF.BNP not significantly elevated ,will give 40 mg iv bid lasix , and consulted Dr Allyson Sabal for management  Continue Bipap ,on home bipap ,ABG ,step down ,cycle enzymes ,    2.  Acute on chronic renal insufficiency cr at baseline, monitor  3. Copd ,will start on duonebs, empiric abx, steroids   4. OSA, chronic respiratory failure, obesity Hypoventilation- cont bipap prn qhs       Code Status:   full Family Communication: bedside Disposition Plan: admit   Time spent: 70 mins   [facility] Pager [phone]  If 7PM-7AM, please contact night-coverage [email or web address] Password [facility] 10/20/2012, 12:39 PM

## 2012-10-20 NOTE — ED Notes (Signed)
Pt states that he has been taking mucinex for a productive cough that has been 2 weeks ago. Pt states that he has been SOB for the last night. Pt has portable O2 all day and night. Pt states also having pain in his upper left abdomen.

## 2012-10-20 NOTE — Consult Note (Signed)
Reason for Consult: CHF   Referring Physician:  Dr. Susie Cassette  [facility]  [name] [name] is an 62 y.o. male.    Cardiologist: Dr. Allyson Sabal  Chief Complaint: SOB    HPI: 62 y.o. male presenting with shortness of breath ,hx of COPD, CAD/diastolic dysfunction, OSA, chronic respiratory failure,obesity Hypoventilation, cor pulmonale, CKD stage 3 , gradually worsening shortness of breath over the last few weeks. He reports that he had a stress test with cardiologist approximately month ago and did well. He reports he's had worsening cough with productive white sputum. He reports positive orthopnea as well as paroxysmal nocturnal dyspnea. He uses 3 L of nasal cannula oxygen normally at home and on occasion has had to use slightly more. He reports no worse lower extremity swelling, but has been gaining weight. He currently takes 80 mg of furosemide orally twice a day. He reports he thinks he is actually urinating more frequently but continued to feel short of breath.     Hx of CABG 2001 and stent to OM branch in 12/05.  PVD as well.  Dobutamine Nuc study 09/17/2012 negative for ischemia Low risk  With nl wall motion.     Past Medical History  Diagnosis Date  . Heart disease, unspecified   . Coronary atherosclerosis of unspecified type of vessel, native or graft   . Chronic pulmonary heart disease, unspecified   . Other hyperalimentation   . Chronic respiratory failure   . Unspecified sleep apnea   . Chronic airway obstruction, not elsewhere classified   . Angina   . Shortness of breath   . Anemia   . Chronic kidney disease   . Anxiety   . Hypertension   . CHF (congestive heart failure)   . Blood transfusion     no reaction from transfusion  . Arthritis     hx of gout  . Diabetes mellitus   . CORONARY ARTERY DISEASE 10/28/2007  . CPAP (continuous positive airway pressure) dependence   . GERD (gastroesophageal reflux disease)     Past Surgical History  Procedure Laterality Date  . Coronary  artery bypass graft    . Appendectomy    . Bilateral vats ablation    . Coronary angioplasty with stent placement    . Hemmorhoids    . Lung sx  2005    Growth on outside of right lung  . Esophagogastroduodenoscopy  08/11/2011    Procedure: ESOPHAGOGASTRODUODENOSCOPY (EGD);  Surgeon: Barrie Folk, MD;  Location: [facility] ENDOSCOPY;  Service: Endoscopy;  Laterality: N/A;  . Colonoscopy  08/11/2011    Procedure: COLONOSCOPY;  Surgeon: Barrie Folk, MD;  Location: [facility] ENDOSCOPY;  Service: Endoscopy;  Laterality: N/A;    Family History  Problem Relation Age of Onset  . Diabetes Sister    Social History:  reports that he quit smoking about 14 years ago. His smoking use included Cigarettes. He has a 15 pack-year smoking history. He has never used smokeless tobacco. He reports that  drinks alcohol. He reports that he does not use illicit drugs.  Allergies:  Allergies  Allergen Reactions  . Amlodipine Besy-Benazepril Hcl Swelling    Lips swell  . Percocet (Oxycodone-Acetaminophen) Other (See Comments)    hallucinations    Outpatient Medications:  See Home meds section.  Lasix had been increased to 80 BID in Jan.  Results for orders placed during the hospital encounter of 10/20/12 (from the past 48 hour(s))  CBC WITH DIFFERENTIAL     Status: Abnormal  Collection Time    10/20/12 10:09 AM      Result Value Range   WBC 5.3  4.0 - 10.5 K/uL   RBC 3.43 (*) 4.22 - 5.81 MIL/uL   Hemoglobin 9.7 (*) 13.0 - 17.0 g/dL   HCT 16.1 (*) 09.6 - 04.5 %   MCV 95.9  78.0 - 100.0 fL   MCH 28.3  26.0 - 34.0 pg   MCHC 29.5 (*) 30.0 - 36.0 g/dL   RDW 40.9  81.1 - 91.4 %   Platelets 136 (*) 150 - 400 K/uL   Neutrophils Relative 62  43 - 77 %   Neutro Abs 3.3  1.7 - 7.7 K/uL   Lymphocytes Relative 25  12 - 46 %   Lymphs Abs 1.3  0.7 - 4.0 K/uL   Monocytes Relative 10  3 - 12 %   Monocytes Absolute 0.6  0.1 - 1.0 K/uL   Eosinophils Relative 2  0 - 5 %   Eosinophils Absolute 0.1  0.0 - 0.7 K/uL    Basophils Relative 1  0 - 1 %   Basophils Absolute 0.0  0.0 - 0.1 K/uL  COMPREHENSIVE METABOLIC PANEL     Status: Abnormal   Collection Time    10/20/12 10:09 AM      Result Value Range   Sodium 145  135 - 145 mEq/L   Potassium 3.7  3.5 - 5.1 mEq/L   Chloride 93 (*) 96 - 112 mEq/L   CO2 >45 (*) 19 - 32 mEq/L   Comment: CRITICAL RESULT CALLED TO, READ BACK BY AND VERIFIED WITH:     KYKERARN [id number] MCCAULEG   Glucose, Bld 135 (*) 70 - 99 mg/dL   BUN 49 (*) 6 - 23 mg/dL   Creatinine, Ser 2.13 (*) 0.50 - 1.35 mg/dL   Calcium 9.5  8.4 - 08.6 mg/dL   Total Protein 7.5  6.0 - 8.3 g/dL   Albumin 3.8  3.5 - 5.2 g/dL   AST 36  0 - 37 U/L   ALT 55 (*) 0 - 53 U/L   Alkaline Phosphatase 76  39 - 117 U/L   Total Bilirubin 0.3  0.3 - 1.2 mg/dL   GFR calc non Af Amer 33 (*) >90 mL/min   GFR calc Af Amer 38 (*) >90 mL/min   Comment:            The eGFR has been calculated     using the CKD EPI equation.     This calculation has not been     validated in all clinical     situations.     eGFR's persistently     <90 mL/min signify     possible Chronic Kidney Disease.  PRO B NATRIURETIC PEPTIDE     Status: Abnormal   Collection Time    10/20/12 10:09 AM      Result Value Range   Pro B Natriuretic peptide (BNP) 448.0 (*) 0 - 125 pg/mL  TROPONIN I     Status: None   Collection Time    10/20/12 10:09 AM      Result Value Range   Troponin I <0.30  <0.30 ng/mL   Comment:            Due to the release kinetics of cTnI,     a negative result within the first hours     of the onset of symptoms does not rule out     myocardial infarction with certainty.  If myocardial infarction is still suspected,     repeat the test at appropriate intervals.   Dg Chest 2 View  10/20/2012  *RADIOLOGY REPORT*  Clinical Data: Shortness of breath and productive cough.  CHEST - 2 VIEW  Comparison: Chest radiograph performed 03/28/2012  Findings: The lungs are well-aerated.  Small bilateral pleural effusions  are again noted.  Bibasilar airspace opacification is seen, with underlying vascular congestion.  Findings are most compatible with pulmonary edema, though underlying pneumonia cannot be excluded.  No pneumothorax is seen.  The heart is enlarged.  The patient is status post median sternotomy, with evidence of prior CABG.  No acute osseous abnormalities are seen.  IMPRESSION: Vascular congestion and cardiomegaly, with small bilateral pleural effusions and bibasilar airspace opacification.  Findings are most compatible with recurrent pulmonary edema, though underlying pneumonia cannot be excluded.   Original Report Authenticated By: Tonia Ghent, M.D.     ROS: General:no colds or fevers, no weight changes Skin:no rashes or ulcers HEENT:no blurred vision, no congestion CV:see HPI PUL:see HPI GI:no diarrhea constipation or melena, no indigestion GU:no hematuria, no dysuria MS:no joint pain, no claudication Neuro:no syncope, no lightheadedness Endo + diabetes, no thyroid disease   Blood pressure 120/54, pulse 96, temperature 98.4 F (36.9 C), temperature source Oral, resp. rate 26, SpO2 99.00%. PE: General:alert and oriented, improved SOB Skin:warm and dry, brisk capillry refill HEENT:normocephalic, sclera clear Neck:thick neck difficult to assess JVD, no bruits  Heart:S1S2 RRR Lungs:clear no wheezes Abd:+ BS, soft, non tender Ext:tr edema, pedal pulses 2+ Neuro:alert and oriented X 3 MAE, follows commands     Assessment/Plan Active Problems:   C O P D   CORONARY ARTERY DISEASE, CABG 2001, last PCI 7/11   DIASTOLIC DYSFUNCTION   RESPIRATORY FAILURE, CHRONIC, on BiPap   Obstructive sleep apnea   Acute respiratory failure with hypoxia  PLAN: per Dr. Allyson Sabal.  INGOLD,LAURA R 10/20/2012, 2:13 PM     Agree with note written by Nada Boozer RNP  Pt well known to . Just seen in the office 2 weeks ago. H/O CAD, COPD, pulmonary fibrosis with COPD, PVOD, CRI. We obtained an MPI in our  office on 09/17/12 which was nl (non ischemic with nl EF). His lasix was doubled. C/O increasing SOB over past 2 weeks with a productive cough and generalized weakness. Denies CP. His BNP is low. And his labs are stable. While this may be slight worsening of CHF (Diastolic dysfunction) I think there is a significant pulmonary component (COPD exacerbation, bronchitis etc). Agree with admission, treat with steroids, nebs and empiric ATBX. Continue diuretics and add zaroxolyn. Will follow with you.  Runell Gess 10/20/2012 2:33 PM

## 2012-10-20 NOTE — Progress Notes (Signed)
Pt. Is tolerating the bipap for now. Pt. Is unhappy with his mask and plans to call his wife to bring him his own mask from home. RT waiting for MD to send ABG order. RT to monitor.

## 2012-10-20 NOTE — Progress Notes (Signed)
Pt arrived from ER to 2900, saw new orders for pt on worklist, called  MD to clarify orders for Bipap and ABG, ABG done at this time, results called back to MD, placed pt on Bipap at this time due to results, will repeat ABG Marylou Flesher

## 2012-10-20 NOTE — ED Provider Notes (Signed)
History     CSN: [id number]  Arrival date & time 10/20/12  0408   First MD Initiated Contact with Patient 10/20/12 [phone]      Chief Complaint  Patient presents with  . Shortness of Breath    (Consider location/radiation/quality/duration/timing/severity/associated sxs/prior treatment) HPI Comments: Patient reports history of congestive heart failure as well as lung disease followed by both Dr. Gery Pray and Dr. Maple Hudson, has had gradually worsening shortness of breath over the last few weeks. He reports that he had a stress test with cardiologist approximately month ago and did well. He reports he's had worsening cough with productive white sputum. He reports positive orthopnea as well as paroxysmal nocturnal dyspnea. He uses 3 L of nasal cannula oxygen normally at home and on occasion has had to use slightly more. He reports no worse lower extremity swelling, but has been gaining weight. He currently takes 80 mg of furosemide orally twice a day. He reports he thinks he is actually urinating more frequently but continues to feel short of breath. He denies any fever or chills. He denies any chest pressure, tightness or pain. He denies any pleuritic pain in his torso. He denies any nausea or vomiting.  Patient is a 62 y.o. male presenting with shortness of breath. The history is provided by the patient, the spouse and medical records.  Shortness of Breath Associated symptoms: cough   Associated symptoms: no chest pain, no diaphoresis, no fever, no vomiting and no wheezing     Past Medical History  Diagnosis Date  . Heart disease, unspecified   . Coronary atherosclerosis of unspecified type of vessel, native or graft   . Chronic pulmonary heart disease, unspecified   . Other hyperalimentation   . Chronic respiratory failure   . Unspecified sleep apnea   . Chronic airway obstruction, not elsewhere classified   . Angina   . Shortness of breath   . Anemia   . Chronic kidney disease   . Anxiety    . Hypertension   . CHF (congestive heart failure)   . Blood transfusion     no reaction from transfusion  . Arthritis     hx of gout  . Diabetes mellitus   . CORONARY ARTERY DISEASE 10/28/2007  . CPAP (continuous positive airway pressure) dependence   . GERD (gastroesophageal reflux disease)     Past Surgical History  Procedure Laterality Date  . Coronary artery bypass graft    . Appendectomy    . Bilateral vats ablation    . Coronary angioplasty with stent placement    . Hemmorhoids    . Lung sx  2005    Growth on outside of right lung  . Esophagogastroduodenoscopy  08/11/2011    Procedure: ESOPHAGOGASTRODUODENOSCOPY (EGD);  Surgeon: Barrie Folk, MD;  Location: [facility] ENDOSCOPY;  Service: Endoscopy;  Laterality: N/A;  . Colonoscopy  08/11/2011    Procedure: COLONOSCOPY;  Surgeon: Barrie Folk, MD;  Location: [facility] ENDOSCOPY;  Service: Endoscopy;  Laterality: N/A;    Family History  Problem Relation Age of Onset  . Diabetes Sister     History  Substance Use Topics  . Smoking status: Former Smoker -- 1.50 packs/day for 10 years    Types: Cigarettes    Quit date: 08/14/1998  . Smokeless tobacco: Never Used  . Alcohol Use: Yes     Comment: social      Review of Systems  Constitutional: Positive for unexpected weight change. Negative for fever, chills, diaphoresis and appetite change.  HENT: Negative for congestion and rhinorrhea.   Respiratory: Positive for cough and shortness of breath. Negative for chest tightness and wheezing.   Cardiovascular: Negative for chest pain.  Gastrointestinal: Negative for nausea and vomiting.  Musculoskeletal: Negative for back pain.  All other systems reviewed and are negative.    Allergies  Amlodipine besy-benazepril hcl and Percocet  Home Medications   Current Outpatient Rx  Name  Route  Sig  Dispense  Refill  . albuterol (PROVENTIL HFA;VENTOLIN HFA) 108 (90 BASE) MCG/ACT inhaler   Inhalation   Inhale 2 puffs into the lungs  every 6 (six) hours as needed. For wheezing         . albuterol (PROVENTIL) (2.5 MG/3ML) 0.083% nebulizer solution   Nebulization   Take 3 mLs (2.5 mg total) by nebulization every 6 (six) hours as needed for wheezing or shortness of breath.   75 mL   12   . aspirin EC 81 MG tablet   Oral   Take 81 mg by mouth daily.         Marland Kitchen BAYER CONTOUR TEST test strip               . clopidogrel (PLAVIX) 75 MG tablet   Oral   Take 75 mg by mouth daily.         Marland Kitchen COLCRYS 0.6 MG tablet   Oral   Take 1 tablet by mouth daily.         . Fluticasone-Salmeterol (ADVAIR) 250-50 MCG/DOSE AEPB   Inhalation   Inhale 1 puff into the lungs every 12 (twelve) hours as needed. For wheezing         . furosemide (LASIX) 40 MG tablet   Oral   Take 80 mg by mouth 2 (two) times daily.         . isosorbide mononitrate (IMDUR) 30 MG 24 hr tablet   Oral   Take 45 mg by mouth daily.          . metoprolol tartrate (LOPRESSOR) 25 MG tablet   Oral   Take 12.5 mg by mouth 2 (two) times daily.         . Multiple Vitamin (MULITIVITAMIN WITH MINERALS) TABS   Oral   Take 1 tablet by mouth daily.         . nitroGLYCERIN (NITROSTAT) 0.4 MG SL tablet   Sublingual   Place 0.4 mg under the tongue every 5 (five) minutes as needed. For chest pain         . pantoprazole (PROTONIX) 40 MG tablet   Oral   Take 1 tablet by mouth daily.         . rosuvastatin (CRESTOR) 20 MG tablet   Oral   Take 20 mg by mouth daily.          . Tamsulosin HCl (FLOMAX) 0.4 MG CAPS   Oral   Take 1 tablet by mouth daily.         Marland Kitchen tiotropium (SPIRIVA) 18 MCG inhalation capsule   Inhalation   Place 1 capsule (18 mcg total) into inhaler and inhale daily. For wheezing   30 capsule   6     BP 120/54  Pulse 96  Temp(Src) 98.4 F (36.9 C) (Oral)  Resp 26  SpO2 99%  Physical Exam  Nursing note and vitals reviewed. Constitutional: He is oriented to person, place, and time. He appears well-developed  and well-nourished.  HENT:  Head: Normocephalic and atraumatic.  Eyes: EOM are normal.  No scleral icterus.  Neck: Normal range of motion. Neck supple. JVD present.  Cardiovascular: Normal rate.   No murmur heard. Pulmonary/Chest: Tachypnea noted. He has no decreased breath sounds. He has rales.  Abdominal: Soft. He exhibits no distension. There is no tenderness. There is no rebound and no guarding.  Musculoskeletal: He exhibits edema.  Neurological: He is alert and oriented to person, place, and time.  Skin: Skin is warm. No rash noted. He is not diaphoretic.  Psychiatric: He has a normal mood and affect.    ED Course  Procedures (including critical care time)  Labs Reviewed  CBC WITH DIFFERENTIAL - Abnormal; Notable for the following:    RBC 3.43 (*)    Hemoglobin 9.7 (*)    HCT 32.9 (*)    MCHC 29.5 (*)    Platelets 136 (*)    All other components within normal limits  COMPREHENSIVE METABOLIC PANEL - Abnormal; Notable for the following:    Chloride 93 (*)    CO2 >45 (*)    Glucose, Bld 135 (*)    BUN 49 (*)    Creatinine, Ser 2.07 (*)    ALT 55 (*)    GFR calc non Af Amer 33 (*)    GFR calc Af Amer 38 (*)    All other components within normal limits  PRO B NATRIURETIC PEPTIDE - Abnormal; Notable for the following:    Pro B Natriuretic peptide (BNP) 448.0 (*)    All other components within normal limits  TROPONIN I   Dg Chest 2 View  10/20/2012  *RADIOLOGY REPORT*  Clinical Data: Shortness of breath and productive cough.  CHEST - 2 VIEW  Comparison: Chest radiograph performed 03/28/2012  Findings: The lungs are well-aerated.  Small bilateral pleural effusions are again noted.  Bibasilar airspace opacification is seen, with underlying vascular congestion.  Findings are most compatible with pulmonary edema, though underlying pneumonia cannot be excluded.  No pneumothorax is seen.  The heart is enlarged.  The patient is status post median sternotomy, with evidence of prior  CABG.  No acute osseous abnormalities are seen.  IMPRESSION: Vascular congestion and cardiomegaly, with small bilateral pleural effusions and bibasilar airspace opacification.  Findings are most compatible with recurrent pulmonary edema, though underlying pneumonia cannot be excluded.   Original Report Authenticated By: Tonia Ghent, M.D.      1. CHF (congestive heart failure)   2. Acute on chronic renal insufficiency     EKG at time 10:28, shows sinus rhythm at a rate of 98, right bundle branch block. No acute ST or T wave abnormalities to suggest ischemia. Appearance is very similar to that from 01/14/2012. Interpretation is abnormal EKG with no acute abnormalities.  11:02 AM Patient given IV Lasix. Patient's electrolytes are similar to prior values. Patient likely with weight gain, overall fluid overload requiring diuresis. Plan is to consult with [facility] hospitalist for admission. EKG shows no ischemia and troponin here is negative. Saturations improved on usual 3 L nasal cannula to 99%.  MDM  Pt's initial O2 sats were 85% on 3L [place] which is likely low by my interpretation.  CXR shows edema.  Pt's symptoms suggest gradually worsening and acute on chronic CHF.  Will get troponin, BNP, give IV lasix and continue to monitor.  Will likely consult for admission.        Gavin Pound. Ghim, MD 10/20/12 1103

## 2012-10-21 LAB — CBC
HCT: 33.7 % — ABNORMAL LOW (ref 39.0–52.0)
Hemoglobin: 9.9 g/dL — ABNORMAL LOW (ref 13.0–17.0)
WBC: 5.3 10*3/uL (ref 4.0–10.5)

## 2012-10-21 LAB — TROPONIN I: Troponin I: <0.3 ng/mL (ref <0.30)

## 2012-10-21 LAB — GLUCOSE, CAPILLARY
Glucose-Capillary: 150 mg/dL — ABNORMAL HIGH (ref 70–99)
Glucose-Capillary: 171 mg/dL — ABNORMAL HIGH (ref 70–99)

## 2012-10-21 LAB — POCT I-STAT 3, ART BLOOD GAS (G3+)
pCO2 arterial: 67 mmHg (ref 35.0–45.0)
pH, Arterial: 7.454 — ABNORMAL HIGH (ref 7.350–7.450)

## 2012-10-21 LAB — BASIC METABOLIC PANEL
BUN: 55 mg/dL — ABNORMAL HIGH (ref 6–23)
CO2: 45 mEq/L (ref 19–32)
Chloride: 93 mEq/L — ABNORMAL LOW (ref 96–112)
GFR calc non Af Amer: 29 mL/min — ABNORMAL LOW (ref >90)
Glucose, Bld: 150 mg/dL — ABNORMAL HIGH (ref 70–99)
Potassium: 4.8 mEq/L (ref 3.5–5.1)
Sodium: 143 mEq/L (ref 135–145)

## 2012-10-21 MED ORDER — INSULIN ASPART 100 UNIT/ML ~~LOC~~ SOLN: 0.0000 [IU] | Freq: Every day | SUBCUTANEOUS | Status: DC

## 2012-10-21 MED ORDER — IPRATROPIUM BROMIDE 0.02 % IN SOLN
0.5000 mg | Freq: Four times a day (QID) | RESPIRATORY_TRACT | Status: DC
  Administered 2012-10-21 – 2012-10-22 (×3): 0.5 mg via RESPIRATORY_TRACT
  Filled 2012-10-21 (×5): qty 2.5

## 2012-10-21 MED ORDER — METHYLPREDNISOLONE SODIUM SUCC 40 MG IJ SOLR
40.0000 mg | Freq: Two times a day (BID) | INTRAMUSCULAR | Status: DC
  Administered 2012-10-21 – 2012-10-22 (×3): 40 mg via INTRAVENOUS
  Filled 2012-10-21 (×5): qty 1

## 2012-10-21 MED ORDER — INSULIN GLARGINE 100 UNIT/ML ~~LOC~~ SOLN
10.0000 [IU] | Freq: Every day | SUBCUTANEOUS | Status: DC
  Administered 2012-10-21 – 2012-10-22 (×2): 10 [IU] via SUBCUTANEOUS

## 2012-10-21 MED ORDER — INSULIN ASPART 100 UNIT/ML ~~LOC~~ SOLN
0.0000 [IU] | Freq: Three times a day (TID) | SUBCUTANEOUS | Status: DC
  Administered 2012-10-22: 12:00:00 via SUBCUTANEOUS
  Administered 2012-10-22: 4 [IU] via SUBCUTANEOUS
  Administered 2012-10-22 – 2012-10-23 (×2): 3 [IU] via SUBCUTANEOUS
  Administered 2012-10-23 (×2): 4 [IU] via SUBCUTANEOUS

## 2012-10-21 MED ORDER — ENOXAPARIN SODIUM 40 MG/0.4ML ~~LOC~~ SOLN
40.0000 mg | SUBCUTANEOUS | Status: DC
  Administered 2012-10-21 – 2012-10-22 (×2): 40 mg via SUBCUTANEOUS
  Filled 2012-10-21 (×3): qty 0.4

## 2012-10-21 MED ORDER — FUROSEMIDE 10 MG/ML IJ SOLN: 40.0000 mg | Freq: Every day | INTRAMUSCULAR | Status: DC

## 2012-10-21 NOTE — Progress Notes (Signed)
[facility] Progress Note [place] TEAM 1 - Stepdown/ICU TEAM   [name] [name] ZOX:[id number] DOB: 08-Oct-1950 DOA: 10/20/2012 PCP: Altamese , MD  Brief narrative: 62 y.o. male presented with shortness of breath who has hx of COPD, CAD/diastolic dysfunction, OSA, chronic respiratory failure, obesity hypoventilation, cor pulmonale, CKD stage 3.  Reported gradually worsening shortness of breath over the last few weeks. He reported that he had a stress test with cardiologist approximately 1 month ago and did well. He reported he'd had worsening cough with productive white sputum. He reported positive orthopnea as well as paroxysmal nocturnal dyspnea. He uses 3 L of nasal cannula oxygen normally at home and on occasion had to use slightly more. He reported no worse lower extremity swelling, but had been gaining weight. He takes 80 mg of furosemide orally twice a day. He reported he thouhgt he was actually urinating more frequently but continued to feel short of breath  Assessment/Plan:  Acute on chronic hypercarbic resp failure - likely multifactorial; A - acute bronchospastic COPD exac Much improved at this time - begin to taper steroids - cont empiric abx to "sterilize the airways"  - hold on long acting B agonist and inhaled steroid when on freq albuterol and solumedrol  B - mild CHF / DIASTOLIC DYSFUNCTION Agree that pt appears to be as dry at present as he can tolerate - care as per Cardiology  C - OHS / Obstructive sleep apnea Cont BIPAP per home regimen  D - cor pulmonale  Maximally diuresed at this time  Chronic renal failure Baseline appears to be crt of ~1.9 - renal function currently a bit aggravated, likely w/ diuresis - follow w/ plan to hold diuretic today   CORONARY ARTERY DISEASE CABG 2001 - stent to OM branch in 12/05 - last PCI 7/11- care as per Cardiology  DM Cont ssi - anticipate hyperglycemia w/ use of systemic steroid  Code Status: FULL  Family  Communication: spoke with pt and wife at bedside Disposition Plan: tele bed transfer  Consultants: [facility] - Cardiology  Procedures: none  Antibiotics: Levaquin 3/9 >>  DVT prophylaxis: lovenox  HPI/Subjective: Pt feels much better this morning.  He denies current chest pain, and states that his SOB is much improved.  He is currently not wheezing.    Objective: Blood pressure 129/76, pulse 90, temperature 98.7 F (37.1 C), temperature source Axillary, resp. rate 20, height 6\' 1"  (1.854 m), weight 140.2 kg (309 lb 1.4 oz), SpO2 100.00%.  Intake/Output Summary (Last 24 hours) at 10/21/12 1217 Last data filed at 10/21/12 0500  Gross per 24 hour  Intake    390 ml  Output   1300 ml  Net   -910 ml    Exam: General: No acute respiratory distress at rest Lungs: poor air movement th/o all fields - only minimal exp wheeze appreciable - no focal crackles Cardiovascular: Regular rate and rhythm without murmur gallop or rub normal S1 and S2 Abdomen: Nontender, nondistended, soft, bowel sounds positive, no rebound, no ascites, no appreciable mass Extremities: No significant cyanosis, clubbing - 1+ edema bilateral lower extremities  Data Reviewed: Basic Metabolic Panel:  Recent Labs Lab 10/20/12 1009 10/20/12 1948 10/21/12 0440  NA 145  --  143  K 3.7  --  4.8  CL 93*  --  93*  CO2 >45*  --  45*  GLUCOSE 135*  --  150*  BUN 49*  --  55*  CREATININE 2.07* 2.04* 2.28*  CALCIUM 9.5  --  9.8   Liver Function Tests:  Recent Labs Lab 10/20/12 1009  AST 36  ALT 55*  ALKPHOS 76  BILITOT 0.3  PROT 7.5  ALBUMIN 3.8   CBC:  Recent Labs Lab 10/20/12 1009 10/20/12 1948 10/21/12 0440  WBC 5.3 4.9 5.3  NEUTROABS 3.3  --   --   HGB 9.7* 10.5* 9.9*  HCT 32.9* 36.4* 33.7*  MCV 95.9 98.4 97.1  PLT 136* 138* 148*   Cardiac Enzymes:  Recent Labs Lab 10/20/12 1009 10/20/12 1731 10/20/12 2342 10/21/12 0440  TROPONINI <0.30 <0.30 <0.30 <0.30   BNP (last 3  results)  Recent Labs  01/20/12 0435 10/20/12 1009 10/21/12 0440  PROBNP 373.1* 448.0* 418.8*   CBG:  Recent Labs Lab 10/20/12 2007 10/21/12 0807  GLUCAP 153* 150*    Recent Results (from the past 240 hour(s))  MRSA PCR SCREENING     Status: None   Collection Time    10/20/12  5:26 PM      Result Value Range Status   MRSA by PCR NEGATIVE  NEGATIVE Final   Comment:            The GeneXpert MRSA Assay (FDA     approved for NASAL specimens     only), is one component of a     comprehensive MRSA colonization     surveillance program. It is not     intended to diagnose MRSA     infection nor to guide or     monitor treatment for     MRSA infections.     Studies:  Recent x-ray studies have been reviewed in detail by the Attending Physician  Scheduled Meds:  Scheduled Meds: . albuterol  2.5 mg Nebulization QID  . aspirin EC  81 mg Oral Daily  . atorvastatin  10 mg Oral q1800  . clopidogrel  75 mg Oral Daily  . colchicine  0.6 mg Oral Daily  . enoxaparin (LOVENOX) injection  40 mg Subcutaneous Q24H  . [START ON 10/22/2012] furosemide  40 mg Intravenous Daily  . ipratropium  0.5 mg Nebulization Q4H  . isosorbide mononitrate  45 mg Oral Daily  . levofloxacin (LEVAQUIN) IV  750 mg Intravenous Q48H  . methylPREDNISolone (SOLU-MEDROL) injection  60 mg Intravenous Q12H  . metoprolol tartrate  12.5 mg Oral BID  . mometasone-formoterol  2 puff Inhalation BID  . nitroGLYCERIN  1 inch Topical Q6H  . pantoprazole  40 mg Oral Daily  . sodium chloride  3 mL Intravenous Q12H  . sodium chloride  3 mL Intravenous Q12H  . tamsulosin  0.4 mg Oral Daily   Continuous Infusions:   Time spent on care of this patient: 32   [facility] T  [facility] Office  [phone] Pager - Text Page per Loretha Stapler as per below:  On-Call/Text Page:      [email or web address]      password [email or web address]  If 7PM-7AM, please contact night-coverage [email or web address] Password [email or web address] 10/21/2012, 12:17 PM    LOS: 1 day

## 2012-10-21 NOTE — Progress Notes (Signed)
Patient visiting with wife in room with no complaints or signs of distress. Vitals signs WDL.  Report called to Despina Arias, RN on [address].  Transferred patient on telemetry with belongings.  Introduced patient to new nurse and left with patient in stable condition. [name] [name]

## 2012-10-21 NOTE — Progress Notes (Signed)
[facility]  Subjective: Moderate improvement with breathing. Still coughing but less productive.  Objective: Vital signs in last 24 hours: Temp:  [97.6 F (36.4 C)-99 F (37.2 C)] 98.7 F (37.1 C) (03/10 0800) Pulse Rate:  [76-102] 90 (03/10 0836) Resp:  [13-44] 20 (03/10 0836) BP: (99-468)/(54-90) 129/76 mmHg (03/10 0836) SpO2:  [93 %-100 %] 100 % (03/10 0913) FiO2 (%):  [30 %] 30 % (03/10 0838) Weight:  [309 lb 1.4 oz (140.2 kg)] 309 lb 1.4 oz (140.2 kg) (03/09 1713) Last BM Date: 10/18/12  Intake/Output from previous day: 03/09 0701 - 03/10 0700 In: 390 [P.O.:240; IV Piggyback:150] Out: 1300 [Urine:1300] Intake/Output this shift:    Medications Current Facility-Administered Medications  Medication Dose Route Frequency Provider Last Rate Last Dose  . 0.9 %  sodium chloride infusion  250 mL Intravenous PRN Richarda Overlie, MD      . acetaminophen (TYLENOL) tablet 650 mg  650 mg Oral Q6H PRN Richarda Overlie, MD       Or  . acetaminophen (TYLENOL) suppository 650 mg  650 mg Rectal Q6H PRN Richarda Overlie, MD      . albuterol (PROVENTIL) (5 MG/ML) 0.5% nebulizer solution 2.5 mg  2.5 mg Nebulization QID Richarda Overlie, MD   2.5 mg at 10/21/12 0835  . aspirin EC tablet 81 mg  81 mg Oral Daily Richarda Overlie, MD   81 mg at 10/21/12 0931  . atorvastatin (LIPITOR) tablet 10 mg  10 mg Oral q1800 Richarda Overlie, MD   10 mg at 10/20/12 1851  . clopidogrel (PLAVIX) tablet 75 mg  75 mg Oral Daily Richarda Overlie, MD   75 mg at 10/21/12 0931  . colchicine tablet 0.6 mg  0.6 mg Oral Daily Richarda Overlie, MD   0.6 mg at 10/21/12 0931  . enoxaparin (LOVENOX) injection 30 mg  30 mg Subcutaneous Q24H Richarda Overlie, MD   30 mg at 10/20/12 2149  . furosemide (LASIX) injection 40 mg  40 mg Intravenous Q12H Richarda Overlie, MD   40 mg at 10/21/12 0600  . ipratropium (ATROVENT) nebulizer solution 0.5 mg  0.5 mg Nebulization Q4H Richarda Overlie, MD   0.5 mg at 10/21/12 0835  . isosorbide  mononitrate (IMDUR) 24 hr tablet 45 mg  45 mg Oral Daily Richarda Overlie, MD   45 mg at 10/21/12 0931  . levofloxacin (LEVAQUIN) IVPB 750 mg  750 mg Intravenous Q48H Drusilla Kanner, RPH 100 mL/hr at 10/20/12 1450 750 mg at 10/20/12 1450  . methylPREDNISolone sodium succinate (SOLU-MEDROL) 125 mg/2 mL injection 60 mg  60 mg Intravenous Q12H Richarda Overlie, MD   60 mg at 10/21/12 0059  . metolazone (ZAROXOLYN) tablet 2.5 mg  2.5 mg Oral Daily Nada Boozer, NP   2.5 mg at 10/21/12 0931  . metoprolol tartrate (LOPRESSOR) tablet 12.5 mg  12.5 mg Oral BID Richarda Overlie, MD   12.5 mg at 10/21/12 0931  . mometasone-formoterol (DULERA) 100-5 MCG/ACT inhaler 2 puff  2 puff Inhalation BID Richarda Overlie, MD   2 puff at 10/21/12 0913  . nitroGLYCERIN (NITROGLYN) 2 % ointment 1 inch  1 inch Topical Q6H Richarda Overlie, MD   1 inch at 10/21/12 0600  . ondansetron (ZOFRAN) tablet 4 mg  4 mg Oral Q6H PRN Richarda Overlie, MD       Or  . ondansetron (ZOFRAN) injection 4 mg  4 mg Intravenous Q6H PRN Richarda Overlie, MD      . pantoprazole (PROTONIX) EC tablet 40  mg  40 mg Oral Daily Richarda Overlie, MD   40 mg at 10/21/12 0932  . sodium chloride 0.9 % injection 3 mL  3 mL Intravenous Q12H Nayana Abrol, MD      . sodium chloride 0.9 % injection 3 mL  3 mL Intravenous Q12H Richarda Overlie, MD   3 mL at 10/21/12 0933  . sodium chloride 0.9 % injection 3 mL  3 mL Intravenous PRN Richarda Overlie, MD      . tamsulosin (FLOMAX) capsule 0.4 mg  0.4 mg Oral Daily Richarda Overlie, MD   0.4 mg at 10/21/12 1610   Facility-Administered Medications Ordered in Other Encounters  Medication Dose Route Frequency Provider Last Rate Last Dose  . darbepoetin alfa-polysorbate (ARANESP) injection 40 mcg  40 mcg Subcutaneous Q14 Days Irena Cords, MD   40 mcg at 06/19/11 1345    PE: General appearance: alert, cooperative, no distress and moderately obese Lungs: decreased breath sounds over bilateral bases, no wheezing, rhonci or rales Heart: regular  rate and rhythm Extremities: 1+ edema Pulses: 2+ and symmetric Skin: warm and dry Neurologic: Grossly normal  Lab Results:   Recent Labs  10/20/12 1009 10/20/12 1948 10/21/12 0440  WBC 5.3 4.9 5.3  HGB 9.7* 10.5* 9.9*  HCT 32.9* 36.4* 33.7*  PLT 136* 138* 148*   BMET  Recent Labs  10/20/12 1009 10/20/12 1948 10/21/12 0440  NA 145  --  143  K 3.7  --  4.8  CL 93*  --  93*  CO2 >45*  --  45*  GLUCOSE 135*  --  150*  BUN 49*  --  55*  CREATININE 2.07* 2.04* 2.28*  CALCIUM 9.5  --  9.8    Assessment/Plan    Principal Problem:   CHF (congestive heart failure) Active Problems:   CORONARY ARTERY DISEASE, CABG 2001, last PCI 7/11   DIASTOLIC DYSFUNCTION   C O P D   RESPIRATORY FAILURE, CHRONIC, on BiPap   Obstructive sleep apnea   Acute respiratory failure with hypoxia  Plan: Admitted yesterday w/ worsening SOB, productive cough and generalized weakness secondary to CHF/ COPD exacerbation. He is on steroids, neb and empiric antibiotics. He diuresed 1.3 L overnight with IV Lasix and PO metolazone. Cr. today is 2.28. Consider decreasing Lasix dose/holding due to worsening renal function. Cr was 2.07 on admission. HR and BP stable. Will continue to monitor.    LOS: 1 day    Brittainy M. Sharol Harness, PA-C 10/21/2012 10:03 AM

## 2012-10-21 NOTE — Care Management Note (Signed)
    Page 1 of 1   10/21/2012     11:03:43 AM   CARE MANAGEMENT NOTE 10/21/2012  Patient:  [name] [name], [name] [name]   Account Number:  [id number]  Date Initiated:  10/21/2012  Documentation initiated by:  Junius Creamer  Subjective/Objective Assessment:   adm w resp distress, chf     Action/Plan:   lives w wife, pcp dr Alwyn Pea, act w [facility]   Anticipated DC Date:     Anticipated DC Plan:  HOME W HOME HEALTH SERVICES      DC Planning Services  CM consult      [facility] Choice  Resumption Of Svcs/PTA Isabella Ida   Choice offered to / List presented to:          [facility] arranged  HH-1 RN  HH-10 DISEASE MANAGEMENT      HH agency  [facility]   Status of service:   Medicare Important Message given?   (If response is "NO", the following Medicare IM given date fields will be blank) Date Medicare IM given:   Date Additional Medicare IM given:    Discharge Disposition:  HOME W HOME HEALTH SERVICES  Per UR Regulation:  Reviewed for med. necessity/level of care/duration of stay  If discussed at Long Length of Stay Meetings, dates discussed:    Comments:  3/10 1103a debbie dowell rn,bsn

## 2012-10-21 NOTE — Progress Notes (Signed)
Echocardiogram 2D Echocardiogram has been performed.  [name], [name] 10/21/2012, 12:33 PM

## 2012-10-21 NOTE — Progress Notes (Signed)
Pt. Seen and examined. Agree with the NP/PA-C note as written.  Breathing has improved somewhat, suspect mostly exacerbation of COPD. BNP is low, creatinine has worsened with IV lasix/zaroxlyn. Will d/c zaroxlyn - hold lasix today, switch to 40 mg IV lasix daily tomorrow. 2D echocardiogram pending, however, LV function was normal in January by dobutamine NST.  Chrystie Nose, MD, [facility] Attending Cardiologist [facility]

## 2012-10-22 DIAGNOSIS — J9600 Acute respiratory failure, unspecified whether with hypoxia or hypercapnia: Secondary | ICD-10-CM

## 2012-10-22 DIAGNOSIS — N289 Disorder of kidney and ureter, unspecified: Secondary | ICD-10-CM

## 2012-10-22 LAB — CBC
HCT: 33.2 % — ABNORMAL LOW (ref 39.0–52.0)
MCH: 28.7 pg (ref 26.0–34.0)
MCHC: 29.5 g/dL — ABNORMAL LOW (ref 30.0–36.0)
RDW: 14.3 % (ref 11.5–15.5)

## 2012-10-22 LAB — BASIC METABOLIC PANEL
BUN: 72 mg/dL — ABNORMAL HIGH (ref 6–23)
Chloride: 90 mEq/L — ABNORMAL LOW (ref 96–112)
Creatinine, Ser: 2.41 mg/dL — ABNORMAL HIGH (ref 0.50–1.35)
GFR calc Af Amer: 32 mL/min — ABNORMAL LOW (ref >90)
GFR calc non Af Amer: 28 mL/min — ABNORMAL LOW (ref >90)
Glucose, Bld: 291 mg/dL — ABNORMAL HIGH (ref 70–99)

## 2012-10-22 MED ORDER — ALBUTEROL SULFATE (5 MG/ML) 0.5% IN NEBU: 2.5000 mg | INHALATION_SOLUTION | RESPIRATORY_TRACT | Status: DC | PRN

## 2012-10-22 MED ORDER — IPRATROPIUM BROMIDE 0.02 % IN SOLN
0.5000 mg | Freq: Two times a day (BID) | RESPIRATORY_TRACT | Status: DC
  Administered 2012-10-22 – 2012-10-23 (×2): 0.5 mg via RESPIRATORY_TRACT
  Filled 2012-10-22 (×2): qty 2.5

## 2012-10-22 MED ORDER — ALBUTEROL SULFATE (5 MG/ML) 0.5% IN NEBU
2.5000 mg | INHALATION_SOLUTION | Freq: Two times a day (BID) | RESPIRATORY_TRACT | Status: DC
  Administered 2012-10-22 – 2012-10-23 (×2): 2.5 mg via RESPIRATORY_TRACT
  Filled 2012-10-22 (×2): qty 0.5

## 2012-10-22 NOTE — Progress Notes (Signed)
[facility]  Subjective: Breathing better.  Still slept in the recliner.  Objective: Vital signs in last 24 hours: Temp:  [97.7 F (36.5 C)-98.7 F (37.1 C)] 97.7 F (36.5 C) (03/11 0500) Pulse Rate:  [68-97] 68 (03/11 0500) Resp:  [16-18] 18 (03/11 0500) BP: (111-120)/(62-70) 111/70 mmHg (03/11 0500) SpO2:  [92 %-100 %] 92 % (03/11 0917) Last BM Date: 10/19/12  Intake/Output from previous day: 03/10 0701 - 03/11 0700 In: 600 [P.O.:600] Out: -  Intake/Output this shift: Total I/O In: 400 [P.O.:400] Out: 275 [Urine:275]  Medications Current Facility-Administered Medications  Medication Dose Route Frequency Provider Last Rate Last Dose  . acetaminophen (TYLENOL) tablet 650 mg  650 mg Oral Q6H PRN Richarda Overlie, MD       Or  . acetaminophen (TYLENOL) suppository 650 mg  650 mg Rectal Q6H PRN Richarda Overlie, MD      . albuterol (PROVENTIL) (5 MG/ML) 0.5% nebulizer solution 2.5 mg  2.5 mg Nebulization BID Ripudeep K Rai, MD      . albuterol (PROVENTIL) (5 MG/ML) 0.5% nebulizer solution 2.5 mg  2.5 mg Nebulization Q4H PRN Ripudeep K Rai, MD      . aspirin EC tablet 81 mg  81 mg Oral Daily Richarda Overlie, MD   81 mg at 10/22/12 0952  . atorvastatin (LIPITOR) tablet 10 mg  10 mg Oral q1800 Richarda Overlie, MD   10 mg at 10/21/12 1841  . clopidogrel (PLAVIX) tablet 75 mg  75 mg Oral Daily Richarda Overlie, MD   75 mg at 10/22/12 0952  . colchicine tablet 0.6 mg  0.6 mg Oral Daily Richarda Overlie, MD   0.6 mg at 10/22/12 0952  . enoxaparin (LOVENOX) injection 40 mg  40 mg Subcutaneous Q24H Brett Fairy, RPH   40 mg at 10/21/12 2241  . insulin aspart (novoLOG) injection 0-20 Units  0-20 Units Subcutaneous TID WC Lonia Blood, MD   4 Units at 10/22/12 [phone]  . insulin aspart (novoLOG) injection 0-5 Units  0-5 Units Subcutaneous QHS Lonia Blood, MD      . insulin glargine (LANTUS) injection 10 Units  10 Units Subcutaneous QHS Lonia Blood, MD   10 Units at  10/21/12 2240  . ipratropium (ATROVENT) nebulizer solution 0.5 mg  0.5 mg Nebulization BID Ripudeep K Rai, MD      . isosorbide mononitrate (IMDUR) 24 hr tablet 45 mg  45 mg Oral Daily Richarda Overlie, MD   45 mg at 10/22/12 0952  . levofloxacin (LEVAQUIN) IVPB 750 mg  750 mg Intravenous Q48H Drusilla Kanner, RPH 100 mL/hr at 10/20/12 1450 750 mg at 10/20/12 1450  . methylPREDNISolone sodium succinate (SOLU-MEDROL) 40 mg/mL injection 40 mg  40 mg Intravenous Q12H Lonia Blood, MD   40 mg at 10/22/12 0958  . metoprolol tartrate (LOPRESSOR) tablet 12.5 mg  12.5 mg Oral BID Richarda Overlie, MD   12.5 mg at 10/22/12 0952  . nitroGLYCERIN (NITROGLYN) 2 % ointment 1 inch  1 inch Topical Q6H Richarda Overlie, MD   1 inch at 10/22/12 0615  . ondansetron (ZOFRAN) tablet 4 mg  4 mg Oral Q6H PRN Richarda Overlie, MD       Or  . ondansetron (ZOFRAN) injection 4 mg  4 mg Intravenous Q6H PRN Richarda Overlie, MD      . pantoprazole (PROTONIX) EC tablet 40 mg  40 mg Oral Daily Richarda Overlie, MD   40 mg at 10/22/12 0952  . tamsulosin (  FLOMAX) capsule 0.4 mg  0.4 mg Oral Daily Richarda Overlie, MD   0.4 mg at 10/22/12 1610   Facility-Administered Medications Ordered in Other Encounters  Medication Dose Route Frequency Provider Last Rate Last Dose  . darbepoetin alfa-polysorbate (ARANESP) injection 40 mcg  40 mcg Subcutaneous Q14 Days Irena Cords, MD   40 mcg at 06/19/11 1345    PE: General appearance: alert, cooperative and no distress Lungs: Decreased BS bilaterally,  No wheeze or rales  Heart: regular rate and rhythm and 1/6 sys MM at LSB and axillae Extremities: Trace LEE Pulses: 2+ and symmetric 1+ PTs Skin: Warm and dry Neurologic: Grossly normal  Lab Results:   Recent Labs  10/20/12 1948 10/21/12 0440 10/22/12 0903  WBC 4.9 5.3 6.2  HGB 10.5* 9.9* 9.8*  HCT 36.4* 33.7* 33.2*  PLT 138* 148* 140*   BMET  Recent Labs  10/20/12 1009 10/20/12 1948 10/21/12 0440 10/22/12 0903  NA 145  --   143 138  K 3.7  --  4.8 4.5  CL 93*  --  93* 90*  CO2 >45*  --  45* 41*  GLUCOSE 135*  --  150* 291*  BUN 49*  --  55* 72*  CREATININE 2.07* 2.04* 2.28* 2.41*  CALCIUM 9.5  --  9.8 8.7      Assessment/Plan   Principal Problem:   CHF (congestive heart failure) Active Problems:   CORONARY ARTERY DISEASE, CABG 2001, last PCI 7/11   DIASTOLIC DYSFUNCTION   C O P D   RESPIRATORY FAILURE, CHRONIC, on BiPap   Obstructive sleep apnea   Acute respiratory failure with hypoxia  Plan:  Wt yesterday was down 2.5 Kg from admit.  No wt today.  Net fluids probably negative again.  IO not charted.  LEE looks improved.  May be a little dry now. SCr continues to worsen.  Lasix held yesterday and metolazone DCd.  Will hold again Today.  Home dose is 80mg  BID.   BP and HR controlled and stable.  May be able to DC home today but prefer to see SCr improving.  ? A little IVF-250cc.      LOS: 2 days    [name] [name] 10/22/2012 12:10 PM

## 2012-10-22 NOTE — Progress Notes (Signed)
Pt. Seen and examined. Agree with the NP/PA-C note as written. Breathing is better - on steroid taper. Holding diuretics due to worsening renal function. Would like to see stabilized creatinine prior to discharge.  Not sure he needs to go home on diuretics given his preserved systolic function.   Chrystie Nose, MD, [facility] Attending Cardiologist [facility]

## 2012-10-22 NOTE — Progress Notes (Signed)
Patient ID: [name] [name]  male  ION:[id number]    DOB: [birth year]    DOA: 10/20/2012  PCP: Altamese Greeley, MD  Brief interim summary: 62 y.o. male presented with shortness of breath who has hx of COPD, CAD/diastolic dysfunction, OSA, chronic respiratory failure, obesity hypoventilation, cor pulmonale, CKD stage 3. Reported gradually worsening shortness of breath over the last few weeks. He reported that he had a stress test with cardiologist approximately 1 month ago and did well. He reported he'd had worsening cough with productive white sputum. He reported positive orthopnea as well as paroxysmal nocturnal dyspnea. He uses 3 L of nasal cannula oxygen normally at home and on occasion had to use slightly more. He reported no worse lower extremity swelling, but had been gaining weight. He takes 80 mg of furosemide orally twice a day. He reported he thouhgt he was actually urinating more frequently but continued to feel short of breath   Assessment/Plan: Principal Problem:   Acute on chronic diastolic CHF (congestive heart failure) - Appears to be compensated now, CO2 is trending down with holding the Zaroxolyn however creatinine still continues to trend up - Lasix placed on hold, [place] cardiology following - 2-D echo done- EF 55-60%, grade 1 diastolic dysfunction, mild aortic stenosis  COPD- acute on chronic hypoxic and hypercarbic respiratory failure: Chronically on BiPAP and 2.5- 3 L O2 - Patient is currently on 3 L O2, poor air entry - Continue albuterol nebs and Solu-Medrol taper, start prednisone in a.m. (his pulmonologist is Dr. Jetty Duhamel)     CORONARY ARTERY DISEASE, CABG 2001,stent to OM branch in 12/05, last PCI 7/11 -  as per Cardiology  OHS / Obstructive sleep apnea  - Cont BIPAP per home regimen   Acute on chronic kidney disease, stage III: Baseline creatinine 1.9 - Continue to hold Lasix and Zaroxolyn, renal function worsened at 2.4  Diabetes mellitus:  -  Continue sliding scale insulin and Lantus  DVT Prophylaxis: Lovenox  Code Status: Full code  Disposition: physical therapy evaluation, hopefully DC in 24-48hrs    Subjective: Feels better, once physical therapy, sitting up in chair, no chest pain  Objective: Weight change:   Intake/Output Summary (Last 24 hours) at 10/22/12 1112 Last data filed at 10/22/12 0900  Gross per 24 hour  Intake    640 ml  Output    275 ml  Net    365 ml   Blood pressure 111/70, pulse 68, temperature 97.7 F (36.5 C), temperature source Oral, resp. rate 18, height 6' (1.829 m), weight 140.2 kg (309 lb 1.4 oz), SpO2 92.00%.  Physical Exam: General: Alert and awake, oriented x3, not in any acute distress. HEENT: anicteric sclera, pupils reactive to light and accommodation, EOMI CVS: S1-S2 clear, no murmur rubs or gallops Chest: Poor air entry throughout, minimal wheezing Abdomen: soft nontender, nondistended, normal bowel sounds, no organomegaly Extremities: no cyanosis, clubbing or edema noted bilaterally Neuro: Cranial nerves II-XII intact, no focal neurological deficits  Lab Results: Basic Metabolic Panel:  Recent Labs Lab 10/21/12 0440 10/22/12 0903  NA 143 138  K 4.8 4.5  CL 93* 90*  CO2 45* 41*  GLUCOSE 150* 291*  BUN 55* 72*  CREATININE 2.28* 2.41*  CALCIUM 9.8 8.7   Liver Function Tests:  Recent Labs Lab 10/20/12 1009  AST 36  ALT 55*  ALKPHOS 76  BILITOT 0.3  PROT 7.5  ALBUMIN 3.8   No results found for this basename: LIPASE, AMYLASE,  in the last  168 hours No results found for this basename: AMMONIA,  in the last 168 hours CBC:  Recent Labs Lab 10/20/12 1009  10/21/12 0440 10/22/12 0903  WBC 5.3  < > 5.3 6.2  NEUTROABS 3.3  --   --   --   HGB 9.7*  < > 9.9* 9.8*  HCT 32.9*  < > 33.7* 33.2*  MCV 95.9  < > 97.1 97.1  PLT 136*  < > 148* 140*  < > = values in this interval not displayed. Cardiac Enzymes:  Recent Labs Lab 10/20/12 1731 10/20/12 2342  10/21/12 0440  TROPONINI <0.30 <0.30 <0.30   BNP: No components found with this basename: POCBNP,  CBG:  Recent Labs Lab 10/21/12 0807 10/21/12 1221 10/21/12 1625 10/21/12 2234 10/22/12 0818  GLUCAP 150* 212* 95 171* 158*     Micro Results: Recent Results (from the past 240 hour(s))  MRSA PCR SCREENING     Status: None   Collection Time    10/20/12  5:26 PM      Result Value Range Status   MRSA by PCR NEGATIVE  NEGATIVE Final   Comment:            The GeneXpert MRSA Assay (FDA     approved for NASAL specimens     only), is one component of a     comprehensive MRSA colonization     surveillance program. It is not     intended to diagnose MRSA     infection nor to guide or     monitor treatment for     MRSA infections.    Studies/Results: Dg Chest 2 View  10/20/2012  *RADIOLOGY REPORT*  Clinical Data: Shortness of breath and productive cough.  CHEST - 2 VIEW  Comparison: Chest radiograph performed 03/28/2012  Findings: The lungs are well-aerated.  Small bilateral pleural effusions are again noted.  Bibasilar airspace opacification is seen, with underlying vascular congestion.  Findings are most compatible with pulmonary edema, though underlying pneumonia cannot be excluded.  No pneumothorax is seen.  The heart is enlarged.  The patient is status post median sternotomy, with evidence of prior CABG.  No acute osseous abnormalities are seen.  IMPRESSION: Vascular congestion and cardiomegaly, with small bilateral pleural effusions and bibasilar airspace opacification.  Findings are most compatible with recurrent pulmonary edema, though underlying pneumonia cannot be excluded.   Original Report Authenticated By: Tonia Ghent, M.D.     Medications: Scheduled Meds: . albuterol  2.5 mg Nebulization BID  . aspirin EC  81 mg Oral Daily  . atorvastatin  10 mg Oral q1800  . clopidogrel  75 mg Oral Daily  . colchicine  0.6 mg Oral Daily  . enoxaparin (LOVENOX) injection  40 mg  Subcutaneous Q24H  . insulin aspart  0-20 Units Subcutaneous TID WC  . insulin aspart  0-5 Units Subcutaneous QHS  . insulin glargine  10 Units Subcutaneous QHS  . ipratropium  0.5 mg Nebulization BID  . isosorbide mononitrate  45 mg Oral Daily  . levofloxacin (LEVAQUIN) IV  750 mg Intravenous Q48H  . methylPREDNISolone (SOLU-MEDROL) injection  40 mg Intravenous Q12H  . metoprolol tartrate  12.5 mg Oral BID  . nitroGLYCERIN  1 inch Topical Q6H  . pantoprazole  40 mg Oral Daily  . tamsulosin  0.4 mg Oral Daily      LOS: 2 days   RAI,RIPUDEEP M.D. [facility] 10/22/2012, 11:12 AM Pager: [phone]  If 7PM-7AM, please contact night-coverage [email or web address] Password [email or web address]

## 2012-10-22 NOTE — Progress Notes (Signed)
Critical lab value called of co2 at 41, Dr. Isidoro Donning at bedside and made aware

## 2012-10-23 LAB — GLUCOSE, CAPILLARY
Glucose-Capillary: 163 mg/dL — ABNORMAL HIGH (ref 70–99)
Glucose-Capillary: 152 mg/dL — ABNORMAL HIGH (ref 70–99)
Glucose-Capillary: 148 mg/dL — ABNORMAL HIGH (ref 70–99)

## 2012-10-23 LAB — BASIC METABOLIC PANEL
BUN: 80 mg/dL — ABNORMAL HIGH (ref 6–23)
CO2: 42 mEq/L (ref 19–32)
Chloride: 92 mEq/L — ABNORMAL LOW (ref 96–112)
Creatinine, Ser: 2.46 mg/dL — ABNORMAL HIGH (ref 0.50–1.35)
GFR calc Af Amer: 31 mL/min — ABNORMAL LOW (ref >90)
Glucose, Bld: 183 mg/dL — ABNORMAL HIGH (ref 70–99)
Potassium: 4.7 mEq/L (ref 3.5–5.1)

## 2012-10-23 MED ORDER — PREDNISONE 20 MG PO TABS
40.0000 mg | ORAL_TABLET | Freq: Two times a day (BID) | ORAL | Status: DC
  Administered 2012-10-23 (×2): 40 mg via ORAL
  Filled 2012-10-23 (×3): qty 2

## 2012-10-23 MED ORDER — PREDNISONE 5 MG PO TABS: 5.0000 mg | ORAL_TABLET | Freq: Every day | ORAL | Status: DC

## 2012-10-23 MED ORDER — FUROSEMIDE 40 MG PO TABS: 40.0000 mg | ORAL_TABLET | Freq: Every day | ORAL | Status: DC

## 2012-10-23 MED ORDER — LEVOFLOXACIN 250 MG PO TABS: 250.0000 mg | ORAL_TABLET | Freq: Every day | ORAL | Status: AC

## 2012-10-23 NOTE — Progress Notes (Signed)
CRITICAL VALUE ALERT  Critical value received:  CO2    Date of notification:  10/23/2012  Time of notification:  0740  Critical value read back: YES   Nurse who received alert:  JESSICA, RN  MD notified (1st page): DR. Susie Cassette  Time of first page:  0745  MD notified (2nd page):  Time of second page:  Responding MD:  DR. Susie Cassette Time MD responded:  0750:  NO NEW ORDERS RECIEVED

## 2012-10-23 NOTE — Discharge Summary (Signed)
Physician Discharge Summary  [name] [name] MRN: [id number] DOB/AGE: [birth year] 62 y.o.  PCP: Altamese Orchards, MD   Admit date: 10/20/2012 Discharge date: 10/23/2012  Discharge Diagnoses:   Acute on chronic renal insufficiency stage III   CHF (congestive heart failure) Active Problems:   CORONARY ARTERY DISEASE, CABG 2001, last PCI 7/11   DIASTOLIC DYSFUNCTION   C O P D   RESPIRATORY FAILURE, CHRONIC, on BiPap   Obstructive sleep apnea   Acute respiratory failure with hypoxia     Medication List    TAKE these medications       albuterol 108 (90 BASE) MCG/ACT inhaler  Commonly known as:  PROVENTIL HFA;VENTOLIN HFA  Inhale 2 puffs into the lungs every 6 (six) hours as needed. For wheezing     albuterol (2.5 MG/3ML) 0.083% nebulizer solution  Commonly known as:  PROVENTIL  Take 3 mLs (2.5 mg total) by nebulization every 6 (six) hours as needed for wheezing or shortness of breath.     aspirin EC 81 MG tablet  Take 81 mg by mouth daily.     BAYER CONTOUR TEST test strip  Generic drug:  glucose blood     clopidogrel 75 MG tablet  Commonly known as:  PLAVIX  Take 75 mg by mouth daily.     COLCRYS 0.6 MG tablet  Generic drug:  colchicine  Take 1 tablet by mouth daily.     Fluticasone-Salmeterol 250-50 MCG/DOSE Aepb  Commonly known as:  ADVAIR  Inhale 1 puff into the lungs every 12 (twelve) hours as needed. For wheezing     furosemide 40 MG tablet  Commonly known as:  LASIX  Take 1 tablet (40 mg total) by mouth daily.     isosorbide mononitrate 30 MG 24 hr tablet  Commonly known as:  IMDUR  Take 45 mg by mouth daily.     levofloxacin 250 MG tablet  Commonly known as:  LEVAQUIN  Take 1 tablet (250 mg total) by mouth daily.     metoprolol tartrate 25 MG tablet  Commonly known as:  LOPRESSOR  Take 12.5 mg by mouth 2 (two) times daily.     multivitamin with minerals Tabs  Take 1 tablet by mouth daily.     nitroGLYCERIN 0.4 MG SL tablet  Commonly known  as:  NITROSTAT  Place 0.4 mg under the tongue every 5 (five) minutes as needed. For chest pain     pantoprazole 40 MG tablet  Commonly known as:  PROTONIX  Take 1 tablet by mouth daily.     predniSONE 5 MG tablet  Commonly known as:  DELTASONE  Take 1 tablet (5 mg total) by mouth daily.     rosuvastatin 20 MG tablet  Commonly known as:  CRESTOR  Take 20 mg by mouth daily.     tamsulosin 0.4 MG Caps  Commonly known as:  FLOMAX  Take 1 tablet by mouth daily.     tiotropium 18 MCG inhalation capsule  Commonly known as:  SPIRIVA  Place 1 capsule (18 mcg total) into inhaler and inhale daily. For wheezing        Discharge Condition: Stable   Disposition: 01-Home or Self Care   Consults: Cardiology  Significant Diagnostic Studies: Dg Chest 2 View  10/20/2012  *RADIOLOGY REPORT*  Clinical Data: Shortness of breath and productive cough.  CHEST - 2 VIEW  Comparison: Chest radiograph performed 03/28/2012  Findings: The lungs are well-aerated.  Small bilateral pleural effusions are again noted.  Bibasilar airspace opacification is seen, with underlying vascular congestion.  Findings are most compatible with pulmonary edema, though underlying pneumonia cannot be excluded.  No pneumothorax is seen.  The heart is enlarged.  The patient is status post median sternotomy, with evidence of prior CABG.  No acute osseous abnormalities are seen.  IMPRESSION: Vascular congestion and cardiomegaly, with small bilateral pleural effusions and bibasilar airspace opacification.  Findings are most compatible with recurrent pulmonary edema, though underlying pneumonia cannot be excluded.   Original Report Authenticated By: Tonia Ghent, M.D.        Microbiology: Recent Results (from the past 240 hour(s))  MRSA PCR SCREENING     Status: None   Collection Time    10/20/12  5:26 PM      Result Value Range Status   MRSA by PCR NEGATIVE  NEGATIVE Final   Comment:            The GeneXpert MRSA Assay  (FDA     approved for NASAL specimens     only), is one component of a     comprehensive MRSA colonization     surveillance program. It is not     intended to diagnose MRSA     infection nor to guide or     monitor treatment for     MRSA infections.     Labs: Results for orders placed during the hospital encounter of 10/20/12 (from the past 48 hour(s))  GLUCOSE, CAPILLARY     Status: None   Collection Time    10/21/12  4:25 PM      Result Value Range   Glucose-Capillary 95  70 - 99 mg/dL  GLUCOSE, CAPILLARY     Status: Abnormal   Collection Time    10/21/12 10:34 PM      Result Value Range   Glucose-Capillary 171 (*) 70 - 99 mg/dL  GLUCOSE, CAPILLARY     Status: Abnormal   Collection Time    10/22/12  8:18 AM      Result Value Range   Glucose-Capillary 158 (*) 70 - 99 mg/dL  BASIC METABOLIC PANEL     Status: Abnormal   Collection Time    10/22/12  9:03 AM      Result Value Range   Sodium 138  135 - 145 mEq/L   Potassium 4.5  3.5 - 5.1 mEq/L   Chloride 90 (*) 96 - 112 mEq/L   CO2 41 (*) 19 - 32 mEq/L   Comment: CRITICAL RESULT CALLED TO, READ BACK BY AND VERIFIED WITH:     DAVIS,C RN @ 1018 ON 10/22/12 BY LEONARD,A   Glucose, Bld 291 (*) 70 - 99 mg/dL   BUN 72 (*) 6 - 23 mg/dL   Creatinine, Ser 1.61 (*) 0.50 - 1.35 mg/dL   Calcium 8.7  8.4 - 09.6 mg/dL   GFR calc non Af Amer 28 (*) >90 mL/min   GFR calc Af Amer 32 (*) >90 mL/min   Comment:            The eGFR has been calculated     using the CKD EPI equation.     This calculation has not been     validated in all clinical     situations.     eGFR's persistently     <90 mL/min signify     possible Chronic Kidney Disease.  CBC     Status: Abnormal   Collection Time    10/22/12  9:03 AM  Result Value Range   WBC 6.2  4.0 - 10.5 K/uL   RBC 3.42 (*) 4.22 - 5.81 MIL/uL   Hemoglobin 9.8 (*) 13.0 - 17.0 g/dL   HCT 16.1 (*) 09.6 - 04.5 %   MCV 97.1  78.0 - 100.0 fL   MCH 28.7  26.0 - 34.0 pg   MCHC 29.5 (*)  30.0 - 36.0 g/dL   RDW 40.9  81.1 - 91.4 %   Platelets 140 (*) 150 - 400 K/uL  GLUCOSE, CAPILLARY     Status: Abnormal   Collection Time    10/22/12 12:10 PM      Result Value Range   Glucose-Capillary 165 (*) 70 - 99 mg/dL  GLUCOSE, CAPILLARY     Status: Abnormal   Collection Time    10/22/12  4:45 PM      Result Value Range   Glucose-Capillary 139 (*) 70 - 99 mg/dL  GLUCOSE, CAPILLARY     Status: Abnormal   Collection Time    10/22/12  8:57 PM      Result Value Range   Glucose-Capillary 182 (*) 70 - 99 mg/dL  BASIC METABOLIC PANEL     Status: Abnormal   Collection Time    10/23/12  6:35 AM      Result Value Range   Sodium 139  135 - 145 mEq/L   Potassium 4.7  3.5 - 5.1 mEq/L   Chloride 92 (*) 96 - 112 mEq/L   CO2 42 (*) 19 - 32 mEq/L   Comment: CRITICAL RESULT CALLED TO, READ BACK BY AND VERIFIED WITH:     Andres Shad RN 10/23/12 0740 COSTELLO B   Glucose, Bld 183 (*) 70 - 99 mg/dL   BUN 80 (*) 6 - 23 mg/dL   Creatinine, Ser 7.82 (*) 0.50 - 1.35 mg/dL   Calcium 8.4  8.4 - 95.6 mg/dL   GFR calc non Af Amer 27 (*) >90 mL/min   GFR calc Af Amer 31 (*) >90 mL/min   Comment:            The eGFR has been calculated     using the CKD EPI equation.     This calculation has not been     validated in all clinical     situations.     eGFR's persistently     <90 mL/min signify     possible Chronic Kidney Disease.  GLUCOSE, CAPILLARY     Status: Abnormal   Collection Time    10/23/12  7:24 AM      Result Value Range   Glucose-Capillary 163 (*) 70 - 99 mg/dL   Comment 1 Notify RN    GLUCOSE, CAPILLARY     Status: Abnormal   Collection Time    10/23/12 11:20 AM      Result Value Range   Glucose-Capillary 152 (*) 70 - 99 mg/dL   Comment 1 Notify RN       HPI :62 y.o. male presenting with shortness of breath ,hx of COPD, CAD/diastolic dysfunction, OSA, chronic respiratory failure,obesity Hypoventilation, cor pulmonale, CKD stage 3 , gradually worsening shortness of breath over  the last few weeks. He reports that he had a stress test with cardiologist approximately month ago and did well. He reports he's had worsening cough with productive white sputum. He reports positive orthopnea as well as paroxysmal nocturnal dyspnea. He uses 3 L of nasal cannula oxygen normally at home and on occasion has had to use slightly more. He  reports no worse lower extremity swelling, but has been gaining weight. He currently takes 80 mg of furosemide orally twice a day. He reports he thinks he is actually urinating more frequently but continued to feel short of breath.  Hx of CABG 2001 and stent to OM branch in 12/05. PVD as well. Dobutamine Nuc study 09/17/2012 negative for ischemia Low risk With nl wall motion.   HOSPITAL COURSE:   Assessment/Plan:  Principal Problem:  Acute on chronic diastolic CHF (congestive heart failure)  - Appears to be compensated now, CO2 is trending down  Patient was taking Lasix 80 mg twice a day He was transitioned to IV Lasix as well as Zaroxolyn Because of increasing creatinine Zaroxolyn was held creatinine still continues to trend up, the patient needs a followup BMP in one week   - Lasix placed on hold, [place] cardiology following  Advised patient to resume Lasix 40 mg daily -  2-D echo done- EF 55-60%, grade 1 diastolic dysfunction, mild aortic stenosis  Probably okay to d/c home from cardiology standpoint. Plan for prn extra lasix for weight gain of >3 lbs over 2-3 days. Will need follow-up Dr. Allyson Sabal  in 5-7 days with mid-level Rosita Guzzetta.    COPD- acute on chronic hypoxic and hypercarbic respiratory failure: Chronically on BiPAP and 2.5- 3 L O2  - Patient is currently on 3 L O2, poor air entry  - Treated with albuterol nebs and Solu-Medrol taper, empiric antibiotics, transitioned to oral prednisone  Will continue taper The patient will continue with oral levofloxacin for another 5 days  (his pulmonologist is Dr. Jetty Duhamel)    CORONARY  ARTERY DISEASE, CABG 2001,stent to OM branch in 12/05, last PCI 7/11  Cardiac markers negative during this admission   OHS / Obstructive sleep apnea  - Cont BIPAP per home regimen   Acute on chronic kidney disease, stage III: Baseline creatinine 1.9  Treated with Lasix and Zaroxolyn, renal function worsened at 2.46   Diabetes mellitus:  Resume outpatient home medications     Discharge Exam:  Blood pressure 122/61, pulse 80, temperature 98.2 F (36.8 C), temperature source Oral, resp. rate 18, height 6' (1.829 m), weight 140.2 kg (309 lb 1.4 oz), SpO2 95.00%.   General appearance: alert, cooperative and no distress  Lungs: clear to auscultation bilaterally  Heart: regular rate and rhythm, S1, S2 normal, no murmur, click, rub or gallop  Extremities: trace LEE  Pulses: 2+ and symmetric  Skin: warm and dry  Neurologic: Grossly normal           Future Appointments Bertine Schlottman Department Dept Phone   10/28/2012 2:00 PM [facility] [phone]   11/07/2012 10:45 AM Waymon Budge, MD [facility] [phone]        Signed: Richarda [name] 10/23/2012, 2:57 PM

## 2012-10-23 NOTE — Progress Notes (Signed)
[facility]  Subjective: Feels much better. No further SOB.    Objective: Vital signs in last 24 hours: Temp:  [98.1 F (36.7 C)-98.4 F (36.9 C)] 98.1 F (36.7 C) (03/12 0500) Pulse Rate:  [71-84] 78 (03/12 1002) Resp:  [18-20] 18 (03/12 0500) BP: (93-133)/(46-87) 115/67 mmHg (03/12 1002) SpO2:  [96 %-100 %] 96 % (03/12 0918) Last BM Date: 10/22/12  Intake/Output from previous day: 03/11 0701 - 03/12 0700 In: 800 [P.O.:800] Out: 1851 [Urine:1850; Stool:1] Intake/Output this shift: Total I/O In: 360 [P.O.:360] Out: 1050 [Urine:1050]  Medications Current Facility-Administered Medications  Medication Dose Route Frequency Provider Last Rate Last Dose  . acetaminophen (TYLENOL) tablet 650 mg  650 mg Oral Q6H PRN Richarda Overlie, MD       Or  . acetaminophen (TYLENOL) suppository 650 mg  650 mg Rectal Q6H PRN Richarda Overlie, MD      . albuterol (PROVENTIL) (5 MG/ML) 0.5% nebulizer solution 2.5 mg  2.5 mg Nebulization BID Ripudeep K Rai, MD   2.5 mg at 10/23/12 0918  . albuterol (PROVENTIL) (5 MG/ML) 0.5% nebulizer solution 2.5 mg  2.5 mg Nebulization Q4H PRN Ripudeep K Rai, MD      . aspirin EC tablet 81 mg  81 mg Oral Daily Richarda Overlie, MD   81 mg at 10/23/12 1000  . atorvastatin (LIPITOR) tablet 10 mg  10 mg Oral q1800 Richarda Overlie, MD   10 mg at 10/22/12 1650  . clopidogrel (PLAVIX) tablet 75 mg  75 mg Oral Daily Richarda Overlie, MD   75 mg at 10/23/12 1000  . colchicine tablet 0.6 mg  0.6 mg Oral Daily Richarda Overlie, MD   0.6 mg at 10/23/12 1000  . enoxaparin (LOVENOX) injection 40 mg  40 mg Subcutaneous Q24H Brett Fairy, RPH   40 mg at 10/22/12 2252  . insulin aspart (novoLOG) injection 0-20 Units  0-20 Units Subcutaneous TID WC Lonia Blood, MD   4 Units at 10/23/12 [phone]  . insulin aspart (novoLOG) injection 0-5 Units  0-5 Units Subcutaneous QHS Lonia Blood, MD      . insulin glargine (LANTUS) injection 10 Units  10 Units Subcutaneous QHS  Lonia Blood, MD   10 Units at 10/22/12 2252  . ipratropium (ATROVENT) nebulizer solution 0.5 mg  0.5 mg Nebulization BID Ripudeep K Rai, MD   0.5 mg at 10/23/12 0917  . isosorbide mononitrate (IMDUR) 24 hr tablet 45 mg  45 mg Oral Daily Richarda Overlie, MD   45 mg at 10/23/12 1001  . levofloxacin (LEVAQUIN) IVPB 750 mg  750 mg Intravenous Q48H Drusilla Kanner, RPH 100 mL/hr at 10/22/12 1448 750 mg at 10/22/12 1448  . metoprolol tartrate (LOPRESSOR) tablet 12.5 mg  12.5 mg Oral BID Richarda Overlie, MD   12.5 mg at 10/23/12 1002  . ondansetron (ZOFRAN) tablet 4 mg  4 mg Oral Q6H PRN Richarda Overlie, MD       Or  . ondansetron (ZOFRAN) injection 4 mg  4 mg Intravenous Q6H PRN Richarda Overlie, MD      . pantoprazole (PROTONIX) EC tablet 40 mg  40 mg Oral Daily Richarda Overlie, MD   40 mg at 10/23/12 1002  . predniSONE (DELTASONE) tablet 40 mg  40 mg Oral BID WC Richarda Overlie, MD   40 mg at 10/23/12 1207  . tamsulosin (FLOMAX) capsule 0.4 mg  0.4 mg Oral Daily Richarda Overlie, MD   0.4 mg at 10/23/12 1002  Facility-Administered Medications Ordered in Other Encounters  Medication Dose Route Frequency Provider Last Rate Last Dose  . darbepoetin alfa-polysorbate (ARANESP) injection 40 mcg  40 mcg Subcutaneous Q14 Days Irena Cords, MD   40 mcg at 06/19/11 1345    PE: General appearance: alert, cooperative and no distress Lungs: clear to auscultation bilaterally Heart: regular rate and rhythm, S1, S2 normal, no murmur, click, rub or gallop Extremities: trace LEE Pulses: 2+ and symmetric Skin: warm and dry Neurologic: Grossly normal  Lab Results:   Recent Labs  10/20/12 1948 10/21/12 0440 10/22/12 0903  WBC 4.9 5.3 6.2  HGB 10.5* 9.9* 9.8*  HCT 36.4* 33.7* 33.2*  PLT 138* 148* 140*   BMET  Recent Labs  10/21/12 0440 10/22/12 0903 10/23/12 0635  NA 143 138 139  K 4.8 4.5 4.7  CL 93* 90* 92*  CO2 45* 41* 42*  GLUCOSE 150* 291* 183*  BUN 55* 72* 80*  CREATININE 2.28* 2.41*  2.46*  CALCIUM 9.8 8.7 8.4    Assessment/Plan  Principal Problem:   CHF (congestive heart failure) Active Problems:   CORONARY ARTERY DISEASE, CABG 2001, last PCI 7/11   DIASTOLIC DYSFUNCTION   C O P D   RESPIRATORY FAILURE, CHRONIC, on BiPap   Obstructive sleep apnea   Acute respiratory failure with hypoxia  Plan: Pt reports significant improvement in breathing. He has been ambulating the halls w/o symptoms. He was diuresed with IV Lasix and metolazone. Both were held the past two days due to rising Cr. Cr today remains elevated at 2.46 (2.41 yesterday). He is not on an ACE-I. BP and HR both stable. ? D/C home vs keeping to see if Cr. Improves. MD to follow and will provide further recommendation.     LOS: 3 days    Brittainy M. Delmer Islam 10/23/2012 12:23 PM

## 2012-10-23 NOTE — Progress Notes (Signed)
Pt. Seen and examined. Agree with the NP/PA-C note as written.  Creatinine has stabilized off lasix. Probably okay to d/c home from our standpoint. Plan for prn lasix for weight gain of >3 lbs over 2-3 days. Will need follow-up in our office in 5-7 days with mid-level provider.  Chrystie Nose, MD, [facility] Attending Cardiologist [facility]

## 2012-10-23 NOTE — Progress Notes (Signed)
ANTIBIOTIC CONSULT NOTE -follow up  Pharmacy Consult for levofloxacin Indication: pneumonia  Allergies  Allergen Reactions  . Amlodipine Besy-Benazepril Hcl Swelling    Lips swell  . Percocet (Oxycodone-Acetaminophen) Other (See Comments)    hallucinations    Patient Measurements: Height: 6' (182.9 cm) Weight: 309 lb 1.4 oz (140.2 kg) IBW/kg (Calculated) : 77.6   Vital Signs: Temp: 98.2 F (36.8 C) (03/12 1400) Temp src: Oral (03/12 1400) BP: 122/61 mmHg (03/12 1400) Pulse Rate: 80 (03/12 1400) Intake/Output from previous day: 03/11 0701 - 03/12 0700 In: 800 [P.O.:800] Out: 1851 [Urine:1850; Stool:1] Intake/Output from this shift: Total I/O In: 360 [P.O.:360] Out: 1050 [Urine:1050]  Labs:  Recent Labs  10/20/12 1948 10/21/12 0440 10/22/12 0903 10/23/12 0635  WBC 4.9 5.3 6.2  --   HGB 10.5* 9.9* 9.8*  --   PLT 138* 148* 140*  --   CREATININE 2.04* 2.28* 2.41* 2.46*   Estimated Creatinine Clearance: 46.3 ml/min (by C-G formula based on Cr of 2.46). No results found for this basename: VANCOTROUGH, Leodis Binet, VANCORANDOM, GENTTROUGH, GENTPEAK, GENTRANDOM, TOBRATROUGH, TOBRAPEAK, TOBRARND, AMIKACINPEAK, AMIKACINTROU, AMIKACIN,  in the last 72 hours   Microbiology: Recent Results (from the past 720 hour(s))  MRSA PCR SCREENING     Status: None   Collection Time    10/20/12  5:26 PM      Result Value Range Status   MRSA by PCR NEGATIVE  NEGATIVE Final   Comment:            The GeneXpert MRSA Assay (FDA     approved for NASAL specimens     only), is one component of a     comprehensive MRSA colonization     surveillance program. It is not     intended to diagnose MRSA     infection nor to guide or     monitor treatment for     MRSA infections.    Medical History: Past Medical History  Diagnosis Date  . Heart disease, unspecified   . Coronary atherosclerosis of unspecified type of vessel, native or graft   . Chronic pulmonary heart disease, unspecified    . Other hyperalimentation   . Chronic respiratory failure   . Unspecified sleep apnea   . Chronic airway obstruction, not elsewhere classified   . Angina   . Shortness of breath   . Anemia   . Chronic kidney disease   . Anxiety   . Hypertension   . CHF (congestive heart failure)   . Blood transfusion     no reaction from transfusion  . Arthritis     hx of gout  . Diabetes mellitus   . CORONARY ARTERY DISEASE 10/28/2007  . CPAP (continuous positive airway pressure) dependence   . GERD (gastroesophageal reflux disease)     Medications:  Scheduled:  . albuterol  2.5 mg Nebulization BID  . aspirin EC  81 mg Oral Daily  . atorvastatin  10 mg Oral q1800  . clopidogrel  75 mg Oral Daily  . colchicine  0.6 mg Oral Daily  . enoxaparin (LOVENOX) injection  40 mg Subcutaneous Q24H  . insulin aspart  0-20 Units Subcutaneous TID WC  . insulin aspart  0-5 Units Subcutaneous QHS  . insulin glargine  10 Units Subcutaneous QHS  . ipratropium  0.5 mg Nebulization BID  . isosorbide mononitrate  45 mg Oral Daily  . levofloxacin (LEVAQUIN) IV  750 mg Intravenous Q48H  . metoprolol tartrate  12.5 mg Oral BID  .  pantoprazole  40 mg Oral Daily  . predniSONE  40 mg Oral BID WC  . tamsulosin  0.4 mg Oral Daily  . [DISCONTINUED] methylPREDNISolone (SOLU-MEDROL) injection  40 mg Intravenous Q12H   Infusions:    Assessment: 62 yo M admitted with suspected PNA. Scr has increased to 2 .46. Estimated Crcl ~ 46 ml/min. I/O 800/1851 yesterday. UOP 0.8 ml/kg/hr.  Currently on levofloxacin 750 mg IV q48h  Day # 4. Afebrile.  WBC 6.2K yesterday. No cultures pending. Current levaquin dosage remains appropriate.   Goal of Therapy:  Eradication of infection  Plan:  Continue Levofloxacin 750 mg IV q48h Follow up SCr, UOP, cultures, clinical course and adjust as clinically indicated.  Noah Delaine, RPh Clinical Pharmacist Pager: [phone] Pharmacy: [phone] 10/23/2012 3:57 PM

## 2012-10-23 NOTE — Evaluation (Signed)
Physical Therapy Evaluation Patient Details Name: [name] [name] MRN: [id number] DOB: [birth year] Today's Date: 10/23/2012 Time: 4696-2952 PT Time Calculation (min): 15 min  PT Assessment / Plan / Recommendation Clinical Impression  Pt presents at mod I level of fucntion and has no further PT needs at this time.    PT Assessment  Patent does not need any further PT services    Follow Up Recommendations  No PT follow up          Equipment Recommendations  None recommended by PT          Precautions / Restrictions Restrictions Weight Bearing Restrictions: No   Pertinent Vitals/Pain spO2 drops to 89-90% with gait training, returns to >93% with standing or seated rest and cues for deep breathing.  Educated pt on need to breathe deeply while ambulating and rest frequently at home as he builds up his strength.      Mobility  Transfers Transfers: Sit to Stand;Stand to Sit Sit to Stand: 6: Modified independent (Device/Increase time) Stand to Sit: 6: Modified independent (Device/Increase time) Details for Transfer Assistance: increased time, use of hands for sit <> stand from recliner Ambulation/Gait Ambulation/Gait Assistance: 6: Modified independent (Device/Increase time) Ambulation Distance (Feet): 150 Feet Assistive device: None Ambulation/Gait Assistance Details: Slow cadence, 2 x 150' with 1 standing rest break, O2 sats to 89-90% with gait, resolved to 93% with rest and cues for deep breathing     PT Goals    Visit Information  Last PT Received On: 10/23/12 Assistance Needed: +1    Subjective Data  Subjective: I feel ok Patient Stated Goal: Go home and get my strength back   Prior Functioning  Home Living Lives With: Spouse Available Help at Discharge: Family Type of Home: House Home Access: Level entry Home Layout: One level Home Adaptive Equipment: Straight cane Prior Function Level of Independence: Independent with assistive device(s) (used cane for  long distances) Communication Communication: No difficulties    Cognition  Cognition Overall Cognitive Status: Appears within functional limits for tasks assessed/performed Arousal/Alertness: Awake/alert Orientation Level: Appears intact for tasks assessed Behavior During Session: [facility] for tasks performed    Extremity/Trunk Assessment Right Lower Extremity Assessment RLE ROM/Strength/Tone: Within functional levels RLE Sensation: WFL - Light Touch RLE Coordination: WFL - gross/fine motor Left Lower Extremity Assessment LLE ROM/Strength/Tone: Within functional levels LLE Sensation: WFL - Light Touch LLE Coordination: WFL - gross/fine motor Trunk Assessment Trunk Assessment: Normal   Balance    End of Session PT - End of Session Equipment Utilized During Treatment: Gait belt;Oxygen Activity Tolerance: Patient tolerated treatment well Patient left: in chair;with call bell/phone within reach;with family/visitor present Nurse Communication: Mobility status       DONAWERTH,KAREN 10/23/2012, 1:54 PM

## 2012-10-23 NOTE — Progress Notes (Signed)
[facility] PROGRESS NOTE  [name] [name]:[id number] DOB: [birth year] DOA: 10/20/2012 PCP: [place], MD  Assessment/Plan: Principal Problem:   CHF (congestive heart failure) Active Problems:   CORONARY ARTERY DISEASE, CABG 2001, last PCI 7/11   DIASTOLIC DYSFUNCTION   C O P D   RESPIRATORY FAILURE, CHRONIC, on BiPap   Obstructive sleep apnea   Acute respiratory failure with hypoxia    Brief interim summary:  62 y.o. male presented with shortness of breath who has hx of COPD, CAD/diastolic dysfunction, OSA, chronic respiratory failure, obesity hypoventilation, cor pulmonale, CKD stage 3. Reported gradually worsening shortness of breath over the last few weeks. He reported that he had a stress test with cardiologist approximately 1 month ago and did well. He reported he'd had worsening cough with productive white sputum. He reported positive orthopnea as well as paroxysmal nocturnal dyspnea. He uses 3 L of nasal cannula oxygen normally at home and on occasion had to use slightly more. He reported no worse lower extremity swelling, but had been gaining weight. He takes 80 mg of furosemide orally twice a day. He reported he thouhgt he was actually urinating more frequently but continued to feel short of breath   Assessment/Plan:  Principal Problem:  Acute on chronic diastolic CHF (congestive heart failure)  - Appears to be compensated now, CO2 is trending down with holding the Zaroxolyn however creatinine still continues to trend up  - Lasix placed on hold, [place] cardiology following  - 2-D echo done- EF 55-60%, grade 1 diastolic dysfunction, mild aortic stenosis  COPD- acute on chronic hypoxic and hypercarbic respiratory failure: Chronically on BiPAP and 2.5- 3 L O2  - Patient is currently on 3 L O2, poor air entry  - Continue albuterol nebs and Solu-Medrol taper, start prednisone today. (his pulmonologist is Dr. Jetty Duhamel)  CORONARY ARTERY DISEASE, CABG  2001,stent to [facility] branch in 12/05, last PCI 7/11  - as per Cardiology , recent stress test OHS / Obstructive sleep apnea  - Cont BIPAP per home regimen  Acute on chronic kidney disease, stage III: Baseline creatinine 1.9  - Continue to hold Lasix and Zaroxolyn, renal function worsened at 2.4  Diabetes mellitus:  - Continue sliding scale insulin and Lantus    DVT Prophylaxis: Lovenox  Code Status: Full code  Disposition: physical therapy evaluation, hopefully DC in 24-48hrs    Subjective:  Feels better, oxygen requirements stable overnight, denies any chest pain or shortness of breath     Objective: Filed Vitals:   10/22/12 1500 10/22/12 1501 10/22/12 2100 10/23/12 0500  BP: 93/46 106/52 103/56 133/87  Pulse: 84  80 71  Temp: 98.3 F (36.8 C)  98.4 F (36.9 C) 98.1 F (36.7 C)  TempSrc:      Resp: 20  18 18   Height:      Weight:      SpO2: 100%  98% 96%    Intake/Output Summary (Last 24 hours) at 10/23/12 0901 Last data filed at 10/23/12 0757  Gross per 24 hour  Intake    400 ml  Output   2176 ml  Net  -1776 ml    Exam:  General: Alert and awake, oriented x3, not in any acute distress.  HEENT: anicteric sclera, pupils reactive to light and accommodation, EOMI  CVS: S1-S2 clear, no murmur rubs or gallops  Chest: Poor air entry throughout, minimal wheezing  Abdomen: soft nontender, nondistended, normal bowel sounds, no organomegaly  Extremities: no cyanosis, clubbing or edema  noted bilaterally  Neuro: Cranial nerves II-XII intact, no focal neurological deficits    Data Reviewed: Basic Metabolic Panel:  Recent Labs Lab 10/20/12 1009 10/20/12 1948 10/21/12 0440 10/22/12 0903 10/23/12 0635  NA 145  --  143 138 139  K 3.7  --  4.8 4.5 4.7  CL 93*  --  93* 90* 92*  CO2 >45*  --  45* 41* 42*  GLUCOSE 135*  --  150* 291* 183*  BUN 49*  --  55* 72* 80*  CREATININE 2.07* 2.04* 2.28* 2.41* 2.46*  CALCIUM 9.5  --  9.8 8.7 8.4    Liver Function  Tests:  Recent Labs Lab 10/20/12 1009  AST 36  ALT 55*  ALKPHOS 76  BILITOT 0.3  PROT 7.5  ALBUMIN 3.8   No results found for this basename: LIPASE, AMYLASE,  in the last 168 hours No results found for this basename: AMMONIA,  in the last 168 hours  CBC:  Recent Labs Lab 10/20/12 1009 10/20/12 1948 10/21/12 0440 10/22/12 0903  WBC 5.3 4.9 5.3 6.2  NEUTROABS 3.3  --   --   --   HGB 9.7* 10.5* 9.9* 9.8*  HCT 32.9* 36.4* 33.7* 33.2*  MCV 95.9 98.4 97.1 97.1  PLT 136* 138* 148* 140*    Cardiac Enzymes:  Recent Labs Lab 10/20/12 1009 10/20/12 1731 10/20/12 2342 10/21/12 0440  TROPONINI <0.30 <0.30 <0.30 <0.30   BNP (last 3 results)  Recent Labs  01/20/12 0435 10/20/12 1009 10/21/12 0440  PROBNP 373.1* 448.0* 418.8*     CBG:  Recent Labs Lab 10/22/12 0818 10/22/12 1210 10/22/12 1645 10/22/12 2057 10/23/12 0724  GLUCAP 158* 165* 139* 182* 163*    Recent Results (from the past 240 hour(s))  MRSA PCR SCREENING     Status: None   Collection Time    10/20/12  5:26 PM      Result Value Range Status   MRSA by PCR NEGATIVE  NEGATIVE Final   Comment:            The GeneXpert MRSA Assay (FDA     approved for NASAL specimens     only), is one component of a     comprehensive MRSA colonization     surveillance program. It is not     intended to diagnose MRSA     infection nor to guide or     monitor treatment for     MRSA infections.     Studies: Dg Chest 2 View  10/20/2012  *RADIOLOGY REPORT*  Clinical Data: Shortness of breath and productive cough.  CHEST - 2 VIEW  Comparison: Chest radiograph performed 03/28/2012  Findings: The lungs are well-aerated.  Small bilateral pleural effusions are again noted.  Bibasilar airspace opacification is seen, with underlying vascular congestion.  Findings are most compatible with pulmonary edema, though underlying pneumonia cannot be excluded.  No pneumothorax is seen.  The heart is enlarged.  The patient is  status post median sternotomy, with evidence of prior CABG.  No acute osseous abnormalities are seen.  IMPRESSION: Vascular congestion and cardiomegaly, with small bilateral pleural effusions and bibasilar airspace opacification.  Findings are most compatible with recurrent pulmonary edema, though underlying pneumonia cannot be excluded.   Original Report Authenticated By: Tonia Ghent, M.D.     Scheduled Meds: . albuterol  2.5 mg Nebulization BID  . aspirin EC  81 mg Oral Daily  . atorvastatin  10 mg Oral q1800  . clopidogrel  75 mg  Oral Daily  . colchicine  0.6 mg Oral Daily  . enoxaparin (LOVENOX) injection  40 mg Subcutaneous Q24H  . insulin aspart  0-20 Units Subcutaneous TID WC  . insulin aspart  0-5 Units Subcutaneous QHS  . insulin glargine  10 Units Subcutaneous QHS  . ipratropium  0.5 mg Nebulization BID  . isosorbide mononitrate  45 mg Oral Daily  . levofloxacin (LEVAQUIN) IV  750 mg Intravenous Q48H  . methylPREDNISolone (SOLU-MEDROL) injection  40 mg Intravenous Q12H  . metoprolol tartrate  12.5 mg Oral BID  . pantoprazole  40 mg Oral Daily  . tamsulosin  0.4 mg Oral Daily   Continuous Infusions:   Principal Problem:   CHF (congestive heart failure) Active Problems:   CORONARY ARTERY DISEASE, CABG 2001, last PCI 7/11   DIASTOLIC DYSFUNCTION   C O P D   RESPIRATORY FAILURE, CHRONIC, on BiPap   Obstructive sleep apnea   Acute respiratory failure with hypoxia    Time spent: 40 minutes   [facility]  [facility] Pager [phone]. If 8PM-8AM, please contact night-coverage at [email or web address], password [facility] 10/23/2012, 9:01 AM  LOS: 3 days

## 2012-10-25 ENCOUNTER — Other Ambulatory Visit (HOSPITAL_COMMUNITY): Payer: Self-pay | Admitting: *Deleted

## 2012-10-28 ENCOUNTER — Encounter (HOSPITAL_COMMUNITY): Payer: Medicare Other

## 2012-11-05 ENCOUNTER — Encounter (HOSPITAL_COMMUNITY)
Admission: RE | Admit: 2012-11-05 | Discharge: 2012-11-05 | Disposition: A | Discharge status: Home / Self Care | Payer: Medicare Other | Source: Ambulatory Visit | Attending: Nephrology | Admitting: Nephrology

## 2012-11-05 DIAGNOSIS — N183 Chronic kidney disease, stage 3 unspecified: Secondary | ICD-10-CM | POA: Insufficient documentation

## 2012-11-05 DIAGNOSIS — D638 Anemia in other chronic diseases classified elsewhere: Secondary | ICD-10-CM | POA: Insufficient documentation

## 2012-11-05 LAB — RENAL FUNCTION PANEL
Albumin: 3.4 g/dL — ABNORMAL LOW (ref 3.5–5.2)
BUN: 39 mg/dL — ABNORMAL HIGH (ref 6–23)
Calcium: 9.3 mg/dL (ref 8.4–10.5)
Chloride: 97 mEq/L (ref 96–112)
Creatinine, Ser: 1.72 mg/dL — ABNORMAL HIGH (ref 0.50–1.35)
GFR calc non Af Amer: 41 mL/min — ABNORMAL LOW (ref >90)

## 2012-11-05 MED ORDER — DARBEPOETIN ALFA-POLYSORBATE 500 MCG/ML IJ SOLN
60.0000 ug | INTRAMUSCULAR | Status: DC
  Filled 2012-11-05: qty 1

## 2012-11-05 MED ORDER — DARBEPOETIN ALFA-POLYSORBATE 60 MCG/0.3ML IJ SOLN
INTRAMUSCULAR | Status: AC
  Administered 2012-11-05: 60 ug via SUBCUTANEOUS
  Filled 2012-11-05: qty 0.3

## 2012-11-07 ENCOUNTER — Ambulatory Visit: Payer: Medicare Other | Admitting: Internal Medicine

## 2012-11-08 ENCOUNTER — Ambulatory Visit (INDEPENDENT_AMBULATORY_CARE_PROVIDER_SITE_OTHER): Payer: Medicare Other | Admitting: Internal Medicine

## 2012-11-08 ENCOUNTER — Encounter: Payer: Self-pay | Admitting: Internal Medicine

## 2012-11-08 VITALS — BP 118/68 | HR 102 | Ht 72.25 in | Wt 327.6 lb

## 2012-11-08 DIAGNOSIS — E678 Other specified hyperalimentation: Secondary | ICD-10-CM

## 2012-11-08 DIAGNOSIS — G4733 Obstructive sleep apnea (adult) (pediatric): Secondary | ICD-10-CM

## 2012-11-08 DIAGNOSIS — J9610 Chronic respiratory failure, unspecified whether with hypoxia or hypercapnia: Secondary | ICD-10-CM

## 2012-11-08 NOTE — Progress Notes (Signed)
Patient ID: [name] [name], male    DOB: 03/06/1951, 62 y.o.   MRN: [id number]  HPI 01/23/11- COPD, CAD/diastolic dysfunction, OSA, chronic respiratory failure, obesity Hypoventilation, cor pulmonale Hosp since last here- once for "shakes"-they told him wasn't using CPAP right, once for epistaxis, once to place urinary stent, and has had a right ? renal biopsy. Told anemic.  Breathing stable, but can't lie flat- feels smothered even on O2.  Runs O2 2-3L/M usually. I gave permision to go to 4 during exertion. Notes "tiresome" feeling midchest, related to exertion/ climbing stairs and relieved by rest. Told to pace himself and not try to push through that.  Discussed need for O2. He doesn't sleep well with his BiPAP 15/12. Discussed sleep hygiene- discussed room temperature. He was more comfortable with autotitration in hosp and we discussed a trial of that at home.  Has restarted diuretic since leg edema has come back.   05/25/11-  COPD, CAD/diastolic dysfunction, OSA, chronic respiratory failure, obesity Hypoventilation, cor pulmonale Went to ER last week- short of breath. Air wasn't satisfying. Was started back on shot for anemia. Feels some better.  Reports sneezing, postnasal drainage and a sense of mucus in his upper chest. Denies fever, sore throat and doesn't think he has a cold.  06/28/11-  COPD, CAD/diastolic dysfunction, OSA, chronic respiratory failure, obesity Hypoventilation, cor pulmonale Recently hospitalized October 25-29, notes reviewed with him and x-ray images reviewed by me. He was hospitalized for hypercapnic respiratory failure with transient encephalopathy and renal insufficiency. Since discharge he has regained some ankle edema while off of furosemide. He has a nephrology appointment later this month. He is concerned that he was taken off his diabetes medicines and is directed to discuss this with his primary physician immediately. He has some persistent soreness across his  anterior chest wall at the level of the xiphoid but otherwise breathing better with less exertional dyspnea and before he went in the hospital. He remains dependent on continuous oxygen at 2 L. He continues his BiPAP machine all night, every night but says he was sleeping better with it  on autotitration with a lower pressure used in the hospital.  09/25/11- COPD, CAD/diastolic dysfunction, OSA, chronic respiratory failure, obesity Hypoventilation, cor pulmonale Since last here he was hospitalized for respiratory failure with CHF and obesity hypoventilation. Better with diuresis. BiPAP 13/10 is now comfortable and used all night every night with supplemental oxygen 4 L. Tolerated colonoscopy without respiratory distress. Uses a rescue inhaler only occasionally but says it does help then.  11/23/11- COPD, CAD/diastolic dysfunction, OSA, chronic respiratory failure, obesity Hypoventilation, cor pulmonale She is noticing some increased shortness of breath on exertion such as getting in the car but no increase in ankle edema which is well controlled. Occasional cough is not progressive or productive. Noticing more rhinorrhea with the pollen. Continues BiPAP  I 13/E 10 all night every night.  02/07/12- COPD, CAD/diastolic dysfunction, OSA, chronic respiratory failure, obesity Hypoventilation, cor pulmonale  pt states doing some better.wakes up out of a nap coughing feels like he's choking.  Post Hosp- 01/14/12- 01/23/12-discharge diagnoses reviewed with him: Coronary artery disease/CABG, diastolic dysfunction, COPD with chronic respiratory failure, acute respiratory failure with hypoxia, acute on chronic renal failure, DM. He was intubated for 4 days and treated with Zosyn, vancomycin and Avelox, Levaquin, Zithromax and Rocephin. Cultures negative. He was to wean off of prednisone over 3 days. He was hoarse after intubation but that has resolved. He is using oxygen 1-2  L and BiPAP  13/10 at night. He feels he is  pretty close to his baseline.  03/28/12-  COPD, CAD/diastolic dysfunction/ chronic CHF, OSA, chronic respiratory failure, obesity Hypoventilation, cor pulmonale  6 MWT today. Pt states breathing has been "okay" since last OV. Pt states having trouble falling alseep-not sleeping well on BiPAP. Pt states mask fit well but thinks the pressure needs to be adjusted again. Currently 13/10+ oxygen at 2 L/ [facility]. He sleeps better in a recliner with oxygen but without his BiPAP. Getting iron injections for anemia. We discussed shortness of breath and anemia. Pain left anterior axillary line described as a sore ache over the past week. He is able to raise his arm. Thinks he maybe lifted something wrong. COPD assessment test (CAT) 21/40 CXR 01/14/12- 1 view- IMPRESSION:  Stable support apparatus. Residual mild interstitial prominence  with slight improvement in aeration. No convincing pulmonary  edema. Probable small left pleural effusion with left basilar  atelectasis or infiltrate.  Original Report Authenticated By: Natasha Mead, M.D.   11/08/12-COPD, CAD/diastolic dysfunction/ chronic CHF, OSA, chronic respiratory failure, obesity Hypoventilation, cor pulmonale Hospitalized again between March 9 and 12 for acute on chronic renal failure with congestive heart failure, chronic respiratory failure on BiPAP and acute respiratory failure with hypoxia. He says he is feeling better at this time. Continues oxygen 3 L/[facility]. He has been using BiPAP 13/10 but thinks he slept better with CPAP. We discussed options. Usually when he decompensates it is from fluid overload triggering CO2 retention and hypoventilation.  CXR 10/20/12 IMPRESSION:  Vascular congestion and cardiomegaly, with small bilateral pleural  effusions and bibasilar airspace opacification. Findings are most  compatible with recurrent pulmonary edema, though underlying  pneumonia cannot be excluded.  Original Report Authenticated By: Tonia Ghent,  M.D.  Review of Systems-see HPI Constitutional:   No- weight loss,  No-night sweats, fevers, chills, +fatigue, lassitude. HEENT:   No-  headaches, difficulty swallowing, tooth/dental problems, sore throat,       No-sneezing, itching, ear ache, nasal congestion, post nasal drip,  CV:  +chest pain, no-orthopnea, PND,  Much less-swelling in lower extremities, anasarca, dizziness, palpitations Resp: + shortness of breath with exertion or at rest.              No-   productive cough,  + non-productive cough,  No-  coughing up of blood.              No-   change in color of mucus.  No- wheezing.   Skin: No-   rash or lesions. GI:  No-   heartburn, indigestion, abdominal pain, nausea, vomiting,  GU: MS:  No-   joint pain or swelling.  Neuro- nothing unusual Psych:  No- change in mood or affect. No depression or anxiety.  No memory loss. Objective:   Physical Exam .BP 118/68  Pulse 102  Ht 6' 0.25" (1.835 m)  Wt 327 lb 9.6 oz (148.598 kg)  BMI 44.13 kg/m2  SpO2 96% O2 3L. obese Skin- rash-none, lesions- none, excoriation- none Lymphadenopathy- none Head- atraumatic            Eyes- Gross vision intact, PERRLA, conjunctivae clear secretions            Ears- Hearing, canals- normal            Nose- Clear, No-Septal dev mucus, polyps, erosion, perforation             Throat- Mallampati II , mucosa clear , drainage- none,  tonsils- atrophic Neck- flexible , trachea midline, no stridor , thyroid nl, carotid no bruit Chest - symmetrical excursion , unlabored           Heart/CV- RRR , +1/6 systolic murmur , no gallop  , no rub, nl s1 s2                           - JVD- none , edema+1, stasis changes- none,  varices- none           Lung- clear to P&A/ distant, wheeze- none, cough- none , dullness-none, rub- none. No rales heard.           Chest wall-  Abd- Br/ Gen/ Rectal- Not done, not indicated Extrem- cyanosis- none, clubbing, none, atrophy- none, strength- nl Neuro- grossly intact to  observation

## 2012-11-08 NOTE — Patient Instructions (Addendum)
Order- DME [facility]- Change BIPAP to 12/12 for trial   Dx Chronic Respiratory Failure, OSA

## 2012-11-11 ENCOUNTER — Telehealth: Payer: Self-pay | Admitting: Internal Medicine

## 2012-11-11 DIAGNOSIS — G4733 Obstructive sleep apnea (adult) (pediatric): Secondary | ICD-10-CM

## 2012-11-11 DIAGNOSIS — J9610 Chronic respiratory failure, unspecified whether with hypoxia or hypercapnia: Secondary | ICD-10-CM

## 2012-11-11 NOTE — Telephone Encounter (Signed)
I spoke with [facility]. She was calling to confirm order for bipap was suppose to be 12/12 and not 12/121. I re put in correct order. Nothing further was needed

## 2012-11-14 ENCOUNTER — Emergency Department (HOSPITAL_COMMUNITY): Payer: Medicare Other

## 2012-11-14 ENCOUNTER — Encounter (HOSPITAL_COMMUNITY): Payer: Self-pay | Admitting: Family Medicine

## 2012-11-14 ENCOUNTER — Inpatient Hospital Stay (HOSPITAL_COMMUNITY)
Admission: EM | Admit: 2012-11-14 | Discharge: 2012-11-18 | DRG: 189 | Disposition: A | Discharge status: Home / Self Care | Payer: Medicare Other | Attending: Pulmonary Disease | Admitting: Pulmonary Disease

## 2012-11-14 DIAGNOSIS — Z7982 Long term (current) use of aspirin: Secondary | ICD-10-CM

## 2012-11-14 DIAGNOSIS — E872 Acidosis, unspecified: Secondary | ICD-10-CM | POA: Diagnosis present

## 2012-11-14 DIAGNOSIS — F411 Generalized anxiety disorder: Secondary | ICD-10-CM | POA: Diagnosis present

## 2012-11-14 DIAGNOSIS — E662 Morbid (severe) obesity with alveolar hypoventilation: Secondary | ICD-10-CM | POA: Diagnosis present

## 2012-11-14 DIAGNOSIS — E119 Type 2 diabetes mellitus without complications: Secondary | ICD-10-CM | POA: Diagnosis present

## 2012-11-14 DIAGNOSIS — J449 Chronic obstructive pulmonary disease, unspecified: Secondary | ICD-10-CM | POA: Diagnosis present

## 2012-11-14 DIAGNOSIS — E669 Obesity, unspecified: Secondary | ICD-10-CM | POA: Diagnosis present

## 2012-11-14 DIAGNOSIS — G9349 Other encephalopathy: Secondary | ICD-10-CM | POA: Diagnosis present

## 2012-11-14 DIAGNOSIS — Z9861 Coronary angioplasty status: Secondary | ICD-10-CM

## 2012-11-14 DIAGNOSIS — G9340 Encephalopathy, unspecified: Secondary | ICD-10-CM

## 2012-11-14 DIAGNOSIS — N189 Chronic kidney disease, unspecified: Secondary | ICD-10-CM | POA: Diagnosis present

## 2012-11-14 DIAGNOSIS — I279 Pulmonary heart disease, unspecified: Secondary | ICD-10-CM | POA: Diagnosis present

## 2012-11-14 DIAGNOSIS — G4733 Obstructive sleep apnea (adult) (pediatric): Secondary | ICD-10-CM | POA: Diagnosis present

## 2012-11-14 DIAGNOSIS — I509 Heart failure, unspecified: Secondary | ICD-10-CM | POA: Diagnosis present

## 2012-11-14 DIAGNOSIS — D649 Anemia, unspecified: Secondary | ICD-10-CM

## 2012-11-14 DIAGNOSIS — I519 Heart disease, unspecified: Secondary | ICD-10-CM

## 2012-11-14 DIAGNOSIS — E678 Other specified hyperalimentation: Secondary | ICD-10-CM

## 2012-11-14 DIAGNOSIS — Z951 Presence of aortocoronary bypass graft: Secondary | ICD-10-CM

## 2012-11-14 DIAGNOSIS — K219 Gastro-esophageal reflux disease without esophagitis: Secondary | ICD-10-CM | POA: Diagnosis present

## 2012-11-14 DIAGNOSIS — I5033 Acute on chronic diastolic (congestive) heart failure: Secondary | ICD-10-CM | POA: Diagnosis present

## 2012-11-14 DIAGNOSIS — I129 Hypertensive chronic kidney disease with stage 1 through stage 4 chronic kidney disease, or unspecified chronic kidney disease: Secondary | ICD-10-CM | POA: Diagnosis present

## 2012-11-14 DIAGNOSIS — N289 Disorder of kidney and ureter, unspecified: Secondary | ICD-10-CM | POA: Diagnosis present

## 2012-11-14 DIAGNOSIS — J441 Chronic obstructive pulmonary disease with (acute) exacerbation: Secondary | ICD-10-CM

## 2012-11-14 DIAGNOSIS — Z87891 Personal history of nicotine dependence: Secondary | ICD-10-CM

## 2012-11-14 DIAGNOSIS — Z833 Family history of diabetes mellitus: Secondary | ICD-10-CM

## 2012-11-14 DIAGNOSIS — Z7902 Long term (current) use of antithrombotics/antiplatelets: Secondary | ICD-10-CM

## 2012-11-14 DIAGNOSIS — J9620 Acute and chronic respiratory failure, unspecified whether with hypoxia or hypercapnia: Principal | ICD-10-CM | POA: Diagnosis present

## 2012-11-14 DIAGNOSIS — I2510 Atherosclerotic heart disease of native coronary artery without angina pectoris: Secondary | ICD-10-CM | POA: Diagnosis present

## 2012-11-14 DIAGNOSIS — Z6841 Body Mass Index (BMI) 40.0 and over, adult: Secondary | ICD-10-CM

## 2012-11-14 DIAGNOSIS — Z888 Allergy status to other drugs, medicaments and biological substances status: Secondary | ICD-10-CM

## 2012-11-14 DIAGNOSIS — Z79899 Other long term (current) drug therapy: Secondary | ICD-10-CM

## 2012-11-14 DIAGNOSIS — R0689 Other abnormalities of breathing: Secondary | ICD-10-CM

## 2012-11-14 DIAGNOSIS — J4489 Other specified chronic obstructive pulmonary disease: Secondary | ICD-10-CM | POA: Diagnosis present

## 2012-11-14 LAB — CBC WITH DIFFERENTIAL/PLATELET
Basophils Absolute: 0 10*3/uL (ref 0.0–0.1)
Basophils Relative: 0 % (ref 0–1)
Eosinophils Absolute: 0.1 10*3/uL (ref 0.0–0.7)
Hemoglobin: 9.5 g/dL — ABNORMAL LOW (ref 13.0–17.0)
MCH: 27.9 pg (ref 26.0–34.0)
MCHC: 28.6 g/dL — ABNORMAL LOW (ref 30.0–36.0)
Monocytes Relative: 9 % (ref 3–12)
Neutro Abs: 3.6 10*3/uL (ref 1.7–7.7)
Neutrophils Relative %: 73 % (ref 43–77)
Platelets: 94 10*3/uL — ABNORMAL LOW (ref 150–400)
RDW: 13.4 % (ref 11.5–15.5)

## 2012-11-14 LAB — COMPREHENSIVE METABOLIC PANEL
AST: 15 U/L (ref 0–37)
Albumin: 3.4 g/dL — ABNORMAL LOW (ref 3.5–5.2)
Alkaline Phosphatase: 69 U/L (ref 39–117)
BUN: 35 mg/dL — ABNORMAL HIGH (ref 6–23)
Creatinine, Ser: 1.91 mg/dL — ABNORMAL HIGH (ref 0.50–1.35)
Potassium: 5 mEq/L (ref 3.5–5.1)
Total Protein: 6.8 g/dL (ref 6.0–8.3)

## 2012-11-14 LAB — POCT I-STAT 3, ART BLOOD GAS (G3+)
Bicarbonate: 45.7 mEq/L — ABNORMAL HIGH (ref 20.0–24.0)
pCO2 arterial: 102.2 mmHg (ref 35.0–45.0)
pO2, Arterial: 64 mmHg — ABNORMAL LOW (ref 80.0–100.0)

## 2012-11-14 LAB — BLOOD GAS, ARTERIAL
Acid-Base Excess: 16.3 mmol/L — ABNORMAL HIGH (ref 0.0–2.0)
Bicarbonate: 43 mEq/L — ABNORMAL HIGH (ref 20.0–24.0)
Drawn by: 33176
TCO2: 45.6 mmol/L (ref 0–100)
pCO2 arterial: 84.9 mmHg (ref 35.0–45.0)
pO2, Arterial: 59 mmHg — ABNORMAL LOW (ref 80.0–100.0)

## 2012-11-14 LAB — PRO B NATRIURETIC PEPTIDE: Pro B Natriuretic peptide (BNP): 539.9 pg/mL — ABNORMAL HIGH (ref 0–125)

## 2012-11-14 LAB — GLUCOSE, CAPILLARY: Glucose-Capillary: 176 mg/dL — ABNORMAL HIGH (ref 70–99)

## 2012-11-14 LAB — TROPONIN I: Troponin I: <0.3 ng/mL (ref <0.30)

## 2012-11-14 MED ORDER — NITROGLYCERIN 0.4 MG SL SUBL: 0.4000 mg | SUBLINGUAL_TABLET | SUBLINGUAL | Status: DC | PRN

## 2012-11-14 MED ORDER — ALBUTEROL SULFATE (5 MG/ML) 0.5% IN NEBU
2.5000 mg | INHALATION_SOLUTION | Freq: Four times a day (QID) | RESPIRATORY_TRACT | Status: DC
  Administered 2012-11-14 – 2012-11-18 (×13): 2.5 mg via RESPIRATORY_TRACT
  Filled 2012-11-14 (×15): qty 0.5

## 2012-11-14 MED ORDER — PANTOPRAZOLE SODIUM 40 MG PO TBEC
40.0000 mg | DELAYED_RELEASE_TABLET | Freq: Every day | ORAL | Status: DC
  Administered 2012-11-14 – 2012-11-18 (×5): 40 mg via ORAL
  Filled 2012-11-14 (×5): qty 1

## 2012-11-14 MED ORDER — CLOPIDOGREL BISULFATE 75 MG PO TABS
75.0000 mg | ORAL_TABLET | Freq: Every day | ORAL | Status: DC
  Administered 2012-11-14 – 2012-11-18 (×5): 75 mg via ORAL
  Filled 2012-11-14 (×5): qty 1

## 2012-11-14 MED ORDER — CHLORHEXIDINE GLUCONATE 0.12 % MT SOLN
15.0000 mL | Freq: Two times a day (BID) | OROMUCOSAL | Status: DC
  Administered 2012-11-14 – 2012-11-18 (×8): 15 mL via OROMUCOSAL
  Filled 2012-11-14 (×10): qty 15

## 2012-11-14 MED ORDER — ATORVASTATIN CALCIUM 40 MG PO TABS
40.0000 mg | ORAL_TABLET | Freq: Every day | ORAL | Status: DC
  Administered 2012-11-14 – 2012-11-17 (×4): 40 mg via ORAL
  Filled 2012-11-14 (×5): qty 1

## 2012-11-14 MED ORDER — METHYLPREDNISOLONE SODIUM SUCC 125 MG IJ SOLR
125.0000 mg | Freq: Once | INTRAMUSCULAR | Status: AC
Start: 2012-11-14 — End: 2012-11-14
  Administered 2012-11-14: 125 mg via INTRAVENOUS
  Filled 2012-11-14: qty 2

## 2012-11-14 MED ORDER — ISOSORBIDE MONONITRATE ER 30 MG PO TB24
45.0000 mg | ORAL_TABLET | Freq: Every day | ORAL | Status: DC
  Administered 2012-11-14 – 2012-11-18 (×5): 45 mg via ORAL
  Filled 2012-11-14 (×5): qty 1

## 2012-11-14 MED ORDER — IPRATROPIUM BROMIDE 0.02 % IN SOLN
0.5000 mg | Freq: Four times a day (QID) | RESPIRATORY_TRACT | Status: DC
  Administered 2012-11-14 – 2012-11-18 (×13): 0.5 mg via RESPIRATORY_TRACT
  Filled 2012-11-14 (×15): qty 2.5

## 2012-11-14 MED ORDER — BIOTENE DRY MOUTH MT LIQD
15.0000 mL | Freq: Two times a day (BID) | OROMUCOSAL | Status: DC
  Administered 2012-11-15 – 2012-11-18 (×7): 15 mL via OROMUCOSAL

## 2012-11-14 MED ORDER — INSULIN ASPART 100 UNIT/ML ~~LOC~~ SOLN
0.0000 [IU] | SUBCUTANEOUS | Status: DC
  Administered 2012-11-14: 2 [IU] via SUBCUTANEOUS
  Administered 2012-11-15 (×2): 1 [IU] via SUBCUTANEOUS

## 2012-11-14 MED ORDER — COLCHICINE 0.6 MG PO TABS
0.6000 mg | ORAL_TABLET | Freq: Every day | ORAL | Status: DC
  Administered 2012-11-14 – 2012-11-18 (×5): 0.6 mg via ORAL
  Filled 2012-11-14 (×5): qty 1

## 2012-11-14 MED ORDER — ALBUTEROL SULFATE (5 MG/ML) 0.5% IN NEBU
5.0000 mg | INHALATION_SOLUTION | Freq: Once | RESPIRATORY_TRACT | Status: AC
  Administered 2012-11-14: 5 mg via RESPIRATORY_TRACT
  Filled 2012-11-14: qty 1

## 2012-11-14 MED ORDER — ADULT MULTIVITAMIN W/MINERALS CH
1.0000 | ORAL_TABLET | Freq: Every day | ORAL | Status: DC
  Administered 2012-11-14 – 2012-11-18 (×5): 1 via ORAL
  Filled 2012-11-14 (×5): qty 1

## 2012-11-14 MED ORDER — HEPARIN SODIUM (PORCINE) 5000 UNIT/ML IJ SOLN
5000.0000 [IU] | Freq: Three times a day (TID) | INTRAMUSCULAR | Status: DC
  Administered 2012-11-14 – 2012-11-18 (×12): 5000 [IU] via SUBCUTANEOUS
  Filled 2012-11-14 (×15): qty 1

## 2012-11-14 MED ORDER — SODIUM CHLORIDE 0.9 % IV SOLN
250.0000 mL | INTRAVENOUS | Status: DC | PRN
  Administered 2012-11-14 (×2): 250 mL via INTRAVENOUS

## 2012-11-14 MED ORDER — FUROSEMIDE 10 MG/ML IJ SOLN
40.0000 mg | Freq: Once | INTRAMUSCULAR | Status: AC
  Administered 2012-11-14: 40 mg via INTRAVENOUS
  Filled 2012-11-14: qty 4

## 2012-11-14 MED ORDER — ASPIRIN EC 81 MG PO TBEC
81.0000 mg | DELAYED_RELEASE_TABLET | Freq: Every day | ORAL | Status: DC
  Administered 2012-11-14 – 2012-11-18 (×5): 81 mg via ORAL
  Filled 2012-11-14 (×5): qty 1

## 2012-11-14 MED ORDER — METOPROLOL TARTRATE 12.5 MG HALF TABLET
12.5000 mg | ORAL_TABLET | Freq: Two times a day (BID) | ORAL | Status: DC
  Administered 2012-11-14 – 2012-11-18 (×9): 12.5 mg via ORAL
  Filled 2012-11-14: qty 1
  Filled 2012-11-14: qty 2
  Filled 2012-11-14 (×8): qty 1

## 2012-11-14 MED ORDER — TAMSULOSIN HCL 0.4 MG PO CAPS
0.4000 mg | ORAL_CAPSULE | Freq: Every day | ORAL | Status: DC
  Administered 2012-11-14 – 2012-11-18 (×5): 0.4 mg via ORAL
  Filled 2012-11-14 (×5): qty 1

## 2012-11-14 MED ORDER — IPRATROPIUM BROMIDE 0.02 % IN SOLN
0.5000 mg | Freq: Once | RESPIRATORY_TRACT | Status: AC
  Administered 2012-11-14: 0.5 mg via RESPIRATORY_TRACT
  Filled 2012-11-14: qty 2.5

## 2012-11-14 NOTE — ED Notes (Signed)
CC MD at bedside 

## 2012-11-14 NOTE — Progress Notes (Signed)
CRITICAL VALUE ALERT  Critical value received:  Panic ABG results: PH 7.32, CO2 85, O2 59, bicarb 43.   Date of notification:  11/14/12  Time of notification:  2045  Critical value read back: yes  Nurse who received alert:  M.Foster Simpson, RN  MD notified (1st page):  [facility] RN notified. Will notify MD.   Time of first page:  2045  MD notified (2nd page):  Time of second page:  Responding MD:    Time MD responded:    ABG results improved from previous results. Will monitor. M.Foster Simpson, RN

## 2012-11-14 NOTE — H&P (Addendum)
PULMONARY  / CRITICAL CARE MEDICINE  Name: [name] [name] MRN: [id number] DOB: 1950-10-17    ADMISSION DATE:  11/14/2012 CONSULTATION DATE:  11/14/2012  REFERRING MD :  Judd Lien   CHIEF COMPLAINT:  Altered Mental Status  BRIEF PATIENT DESCRIPTION: 62 year old male with h/o COPD (on 3L home 02), CAD, OSA, CHF, and DM. Has had a gradual increase in confusion over the past 3 days (4/1 - 4/3). 4/2 fell while home alone and was on floor for several hours. Presents 4/3 to [facility] ED.   SIGNIFICANT EVENTS / STUDIES:    LINES / TUBES: PIV  CULTURES:   ANTIBIOTICS:  HISTORY OF PRESENT ILLNESS:   62 year old male with h/o COPD (on 3L home 02), CAD, CHF, and DM. He has a recent admission in 10/2012 for bronchitis and AECOPD during which he became severely hypercarbic and had a change in his mental status to where he was minimally responsive. This resolved and he was discharged 3/12. He returned to his USOH, which consists of him on 3L O2 at home and HS CPAP, which he only uses for a couple of hours every night. The past couple days his wife noticed that be started to get shaky and would drop things, which is not normal for him. He also had increased confusion. In that setting he feel at some point during the day 4/2 while his wife was at work. When she got home in the evening he was still on the floor and he remained there for several more hours before he was able to get up, and refused coming to the ED She is unaware of any injury as a result of this. This morning (4/3) they did present to the ED with a chief complaint of altered mental status, which his wife attributes to likely hypercarbia, as he has presented this way in the past.   PAST MEDICAL HISTORY :  Past Medical History  Diagnosis Date  . Heart disease, unspecified   . Coronary atherosclerosis of unspecified type of vessel, native or graft   . Chronic pulmonary heart disease, unspecified   . Other hyperalimentation   . Chronic respiratory  failure   . Unspecified sleep apnea   . Chronic airway obstruction, not elsewhere classified   . Angina   . Shortness of breath   . Anemia   . Chronic kidney disease   . Anxiety   . Hypertension   . CHF (congestive heart failure)   . Blood transfusion     no reaction from transfusion  . Arthritis     hx of gout  . Diabetes mellitus   . CORONARY ARTERY DISEASE 10/28/2007  . CPAP (continuous positive airway pressure) dependence   . GERD (gastroesophageal reflux disease)    Past Surgical History  Procedure Laterality Date  . Coronary artery bypass graft    . Appendectomy    . Bilateral vats ablation    . Coronary angioplasty with stent placement    . Hemmorhoids    . Lung sx  2005    Growth on outside of right lung  . Esophagogastroduodenoscopy  08/11/2011    Procedure: ESOPHAGOGASTRODUODENOSCOPY (EGD);  Surgeon: Barrie Folk, MD;  Location: [facility] ENDOSCOPY;  Service: Endoscopy;  Laterality: N/A;  . Colonoscopy  08/11/2011    Procedure: COLONOSCOPY;  Surgeon: Barrie Folk, MD;  Location[facility] ENDOSCOPY;  Service: Endoscopy;  Laterality: N/A;   Prior to Admission medications   Medication Sig Start Date End Date Taking? Authorizing  Provider  albuterol (PROVENTIL HFA;VENTOLIN HFA) 108 (90 BASE) MCG/ACT inhaler Inhale 2 puffs into the lungs every 6 (six) hours as needed. For wheezing   Yes Historical Provider, MD  albuterol (PROVENTIL) (2.5 MG/3ML) 0.083% nebulizer solution Take 3 mLs (2.5 mg total) by nebulization every 6 (six) hours as needed for wheezing or shortness of breath. 03/28/12 03/28/13 Yes Waymon Budge, MD  aspirin EC 81 MG tablet Take 81 mg by mouth daily.   Yes Historical Provider, MD  clopidogrel (PLAVIX) 75 MG tablet Take 75 mg by mouth daily.   Yes Historical Provider, MD  COLCRYS 0.6 MG tablet Take 1 tablet by mouth daily. 01/02/11  Yes Historical Provider, MD  Fluticasone-Salmeterol (ADVAIR) 250-50 MCG/DOSE AEPB Inhale 1 puff into the lungs every 12 (twelve) hours as  needed. For wheezing   Yes Historical Provider, MD  furosemide (LASIX) 40 MG tablet Take 80 mg by mouth daily. 10/23/12  Yes Richarda Overlie, MD  isosorbide mononitrate (IMDUR) 30 MG 24 hr tablet Take 45 mg by mouth daily.    Yes Historical Provider, MD  metoprolol tartrate (LOPRESSOR) 25 MG tablet Take 12.5 mg by mouth 2 (two) times daily. 07/22/11  Yes Luke K Kilroy, PA-C  Multiple Vitamin (MULITIVITAMIN WITH MINERALS) TABS Take 1 tablet by mouth daily.   Yes Historical Provider, MD  nitroGLYCERIN (NITROSTAT) 0.4 MG SL tablet Place 0.4 mg under the tongue every 5 (five) minutes as needed. For chest pain   Yes Historical Provider, MD  pantoprazole (PROTONIX) 40 MG tablet Take 1 tablet by mouth daily. 12/18/10  Yes Historical Provider, MD  rosuvastatin (CRESTOR) 20 MG tablet Take 20 mg by mouth daily.    Yes Historical Provider, MD  Tamsulosin HCl (FLOMAX) 0.4 MG CAPS Take 1 tablet by mouth daily. 12/16/10  Yes Historical Provider, MD  tiotropium (SPIRIVA) 18 MCG inhalation capsule Place 1 capsule (18 mcg total) into inhaler and inhale daily. For wheezing 01/23/12 01/22/13 Yes Simonne Martinet, NP  BAYER CONTOUR TEST test strip  01/24/12   Historical Provider, MD   Allergies  Allergen Reactions  . Amlodipine Besy-Benazepril Hcl Swelling    Lips swell  . Percocet (Oxycodone-Acetaminophen) Other (See Comments)    hallucinations    FAMILY HISTORY:  Family History  Problem Relation Age of Onset  . Diabetes Sister    SOCIAL HISTORY:  reports that he quit smoking about 14 years ago. His smoking use included Cigarettes. He has a 15 pack-year smoking history. He has never used smokeless tobacco. He reports that  drinks alcohol. He reports that he does not use illicit drugs.  REVIEW OF SYSTEMS:    - POSITIVES IN BOLD Constitutional: No weight loss, gain, night sweats, Fevers, chills, fatigue .  HEENT: No headaches, visual changes, Difficulty swallowing, Tooth/dental problems, or Sore throat,  No sneezing,  itching, ear ache, nasal congestion, post nasal drip, no visual complaints CV: No chest pain, Orthopnea, PND, swelling in lower extremities, dizziness, palpitations, syncope.  GI No heartburn, indigestion, abdominal pain, nausea, vomiting, diarrhea, change in bowel habits, loss of appetite, bloody stools.  Resp: No mild cough, non productive, No coughing up of blood. No change in color of mucus. No wheezing.  Skin: no rash or itching or icterus GU: no dysuria, change in color of urine, no urgency or frequency. No flank pain, no hematuria  MS: No joint pain or swelling. No decreased range of motion  Psych: No change in mood or affect. No depression or anxiety.  Neuro: no  difficulty with speech, generalized weakness, numbness, ataxia, confusion. SUBJECTIVE:  A little confused, but no distress  VITAL SIGNS: Temp:  [98.7 F (37.1 C)] 98.7 F (37.1 C) (04/03 0715) Pulse Rate:  [91] 91 (04/03 0715) Resp:  [14] 14 (04/03 0715) BP: (130)/(75) 130/75 mmHg (04/03 0715) SpO2:  [98 %] 98 % (04/03 0715) HEMODYNAMICS:   VENTILATOR SETTINGS:   INTAKE / OUTPUT: Intake/Output   None     PHYSICAL EXAMINATION: General:  Obese, acutely ill appearing male.  Neuro:  AAOx4, but does does not recall events leading up to presentation clearly.  HEENT:  [place], PERRL, JVP difficult to assess due to nick girth.  Cardiovascular: RRR, +1 nonpitting edema BLE Lungs:  Diminished, poor air movement throughout.  Abdomen:  Soft, non-tender. Musculoskeletal:  Intact Skin:  Intact  LABS:  Recent Labs Lab 11/14/12 0740 11/14/12 0819  HGB 9.5*  --   WBC 5.0  --   PLT 94*  --   NA 143  --   K 5.0  --   CL 96  --   CO2 42*  --   GLUCOSE 85  --   BUN 35*  --   CREATININE 1.91*  --   CALCIUM 9.1  --   AST 15  --   ALT 12  --   ALKPHOS 69  --   BILITOT 0.3  --   PROT 6.8  --   ALBUMIN 3.4*  --   TROPONINI <0.30  --   PROBNP 539.9*  --   PHART  --  7.259*  PCO2ART  --  102.2*  PO2ART  --  64.0*    No results found for this basename: GLUCAP,  in the last 168 hours  BNP    Component Value Date/Time   PROBNP 539.9* 11/14/2012 0740    CXR: 4/3 Bilateral airspace disease. Bibasilar opacities ATX vs Edema  ASSESSMENT / PLAN:  PULMONARY A:Acute on chronic hypercarbic and hypoxemic respiratory failure - Severe underlying restrictive physiology due to obesity and chronic pleural disease, likely element of secondary PAH as exam supports element of decompensated cor pulmonale.   OSA P:   NPPV-->he may need BIPAP at home vs CPAP. His compliance at home with this therapy is a major barrier.  Diurese as below  scheduled BDs F/u CXR F/u ABG   CARDIOVASCULAR A: H/o diastolic CHF     Prob cor pulmonale  P:  Change lasix to IV Last echo showed diastolic dysfxn but body habitus prevented estimate of PA pressures and good look at RV fxn. This was done in March 2014 so will defer another echo for now as not likely to assist in his care  RENAL A:  Chronic Kidney Disease P:   Close monitor of renal status in setting of diuresis.  BMP Daily  GASTROINTESTINAL A:  No Acute Issue P:   Stress ulcer prophylaxis  HEMATOLOGIC  A:  No Acute Issue P:  Supportive Care  INFECTIOUS A:  No Acute Issue P:   Supportive Care  ENDOCRINE A:  DM P:   Hyperglycemia protocol  NEUROLOGIC A:  Acute Encephalopathy in setting of acute respiratory failure.  P:   Supportive Care  TODAY'S SUMMARY: Mr Osment has a 3 day history of increasing confusion. This has happened before when he is hypercarbic. He will be admitted with acute on chronic hypercarbic resp failure due to restrictive > obstructive physiology on the basis of obesity and chronic pleural disease  I have personally obtained a history,  examined the patient, evaluated laboratory and imaging results, formulated the assessment and plan and placed orders.   Billy Fischer, MD ; [facility] [phone].  After 5:30 PM or  weekends, call [phone]    11/14/2012, 10:26 AM

## 2012-11-14 NOTE — Progress Notes (Signed)
Repeat ABG done. Dr. Sung Amabile notified of results. RT will continue to monitor.

## 2012-11-14 NOTE — ED Notes (Signed)
Pt from home, fell yesterday, difficulty breathing since. Pt c/o slight chest discomfort. Wife stating , more lethargic.

## 2012-11-14 NOTE — ED Notes (Signed)
MD at bedside. 

## 2012-11-14 NOTE — ED Notes (Signed)
Patient transported to X-ray 

## 2012-11-14 NOTE — Assessment & Plan Note (Signed)
He is fully compliant with his BiPAP and not aware of snoring so control is probably adequate. As explained above, he wants to try CPAP again so we are going to change to 12/12.

## 2012-11-14 NOTE — Assessment & Plan Note (Signed)
He would like to try changing BiPAP to CPAP so I suggested changing from 13/10 to 12/12. He does not need 96% oxygen saturation and I explained he can run oxygen 2 L especially at rest and for sleep.Marland Kitchen He may need 3 or 4 L when active.

## 2012-11-14 NOTE — Assessment & Plan Note (Signed)
Relatively shallow diaphragm excursion and his body build would be consistent with obesity/hypoventilation. This will be aggravated by fluid retention, leading to CO2 retention risk.

## 2012-11-14 NOTE — ED Provider Notes (Signed)
History     CSN: [id number]  Arrival date & time 11/14/12  0701   First MD Initiated Contact with Patient 11/14/12 [phone]      Chief Complaint  Patient presents with  . Shortness of Breath  . Fall    (Consider location/radiation/quality/duration/timing/severity/associated sxs/prior treatment) HPI Comments: Patient with history of copd, cad with cabg in 2001, stents placed since then.  Presents today with increased fatigue shortness of breath.  The wife says he has been less aware and more confused.  This has happened in the past when CO2 has built up.  He wears oxygen 24/7 at 3 LNC.    PCP:  Alwyn Pea  Patient is a 62 y.o. male presenting with shortness of breath. The history is provided by the patient.  Shortness of Breath Severity:  Moderate Onset quality:  Gradual Duration:  3 days Timing:  Constant Progression:  Worsening Chronicity:  Recurrent Context: activity   Relieved by:  Nothing Worsened by:  Nothing tried Ineffective treatments:  None tried Associated symptoms: no chest pain, no cough and no fever     Past Medical History  Diagnosis Date  . Heart disease, unspecified   . Coronary atherosclerosis of unspecified type of vessel, native or graft   . Chronic pulmonary heart disease, unspecified   . Other hyperalimentation   . Chronic respiratory failure   . Unspecified sleep apnea   . Chronic airway obstruction, not elsewhere classified   . Angina   . Shortness of breath   . Anemia   . Chronic kidney disease   . Anxiety   . Hypertension   . CHF (congestive heart failure)   . Blood transfusion     no reaction from transfusion  . Arthritis     hx of gout  . Diabetes mellitus   . CORONARY ARTERY DISEASE 10/28/2007  . CPAP (continuous positive airway pressure) dependence   . GERD (gastroesophageal reflux disease)     Past Surgical History  Procedure Laterality Date  . Coronary artery bypass graft    . Appendectomy    . Bilateral vats ablation    .  Coronary angioplasty with stent placement    . Hemmorhoids    . Lung sx  2005    Growth on outside of right lung  . Esophagogastroduodenoscopy  08/11/2011    Procedure: ESOPHAGOGASTRODUODENOSCOPY (EGD);  Surgeon: Barrie Folk, MD;  Location[facility] ENDOSCOPY;  Service: Endoscopy;  Laterality: N/A;  . Colonoscopy  08/11/2011    Procedure: COLONOSCOPY;  Surgeon: Barrie Folk, MD;  Location: [facility] ENDOSCOPY;  Service: Endoscopy;  Laterality: N/A;    Family History  Problem Relation Age of Onset  . Diabetes Sister     History  Substance Use Topics  . Smoking status: Former Smoker -- 1.50 packs/day for 10 years    Types: Cigarettes    Quit date: 08/14/1998  . Smokeless tobacco: Never Used  . Alcohol Use: Yes     Comment: social      Review of Systems  Constitutional: Positive for fatigue. Negative for fever, chills and unexpected weight change.  Respiratory: Positive for shortness of breath. Negative for cough.   Cardiovascular: Positive for leg swelling. Negative for chest pain.  All other systems reviewed and are negative.    Allergies  Amlodipine besy-benazepril hcl and Percocet  Home Medications   Current Outpatient Rx  Name  Route  Sig  Dispense  Refill  . albuterol (PROVENTIL HFA;VENTOLIN HFA) 108 (90 BASE) MCG/ACT inhaler  Inhalation   Inhale 2 puffs into the lungs every 6 (six) hours as needed. For wheezing         . albuterol (PROVENTIL) (2.5 MG/3ML) 0.083% nebulizer solution   Nebulization   Take 3 mLs (2.5 mg total) by nebulization every 6 (six) hours as needed for wheezing or shortness of breath.   75 mL   12   . aspirin EC 81 MG tablet   Oral   Take 81 mg by mouth daily.         Marland Kitchen BAYER CONTOUR TEST test strip               . clopidogrel (PLAVIX) 75 MG tablet   Oral   Take 75 mg by mouth daily.         Marland Kitchen COLCRYS 0.6 MG tablet   Oral   Take 1 tablet by mouth daily.         . Fluticasone-Salmeterol (ADVAIR) 250-50 MCG/DOSE AEPB    Inhalation   Inhale 1 puff into the lungs every 12 (twelve) hours as needed. For wheezing         . furosemide (LASIX) 40 MG tablet   Oral   Take 1 tablet (40 mg total) by mouth daily.   30 tablet   0   . isosorbide mononitrate (IMDUR) 30 MG 24 hr tablet   Oral   Take 45 mg by mouth daily.          . metoprolol tartrate (LOPRESSOR) 25 MG tablet   Oral   Take 12.5 mg by mouth 2 (two) times daily.         . Multiple Vitamin (MULITIVITAMIN WITH MINERALS) TABS   Oral   Take 1 tablet by mouth daily.         . nitroGLYCERIN (NITROSTAT) 0.4 MG SL tablet   Sublingual   Place 0.4 mg under the tongue every 5 (five) minutes as needed. For chest pain         . pantoprazole (PROTONIX) 40 MG tablet   Oral   Take 1 tablet by mouth daily.         . predniSONE (DELTASONE) 5 MG tablet   Oral   Take 1 tablet (5 mg total) by mouth daily.   60 tablet   0     6 tablets for 3 days, 5 tablets for 3 days, 4 tabl ...   . rosuvastatin (CRESTOR) 20 MG tablet   Oral   Take 20 mg by mouth daily.          . Tamsulosin HCl (FLOMAX) 0.4 MG CAPS   Oral   Take 1 tablet by mouth daily.         Marland Kitchen tiotropium (SPIRIVA) 18 MCG inhalation capsule   Inhalation   Place 1 capsule (18 mcg total) into inhaler and inhale daily. For wheezing   30 capsule   6     BP 130/75  Pulse 91  Temp(Src) 98.7 F (37.1 C) (Oral)  Resp 14  SpO2 98%  Physical Exam  Nursing note and vitals reviewed. Constitutional: He is oriented to person, place, and time. He appears well-developed.  HENT:  Head: Normocephalic and atraumatic.  Mouth/Throat: Oropharynx is clear and moist.  Neck: Normal range of motion. Neck supple.  Cardiovascular: Normal rate and regular rhythm.   No murmur heard. Pulmonary/Chest: Effort normal. He has no wheezes.  There is poor inspiratory effort with scattered rhonchi bilaterally.  Abdominal: Soft. Bowel sounds are normal. He  exhibits no distension. There is no tenderness.   Musculoskeletal: Normal range of motion. He exhibits edema.  There is 2+ pitting edema in the ble.    Neurological: He is alert and oriented to person, place, and time. No cranial nerve deficit. Coordination normal.  Skin: Skin is warm and dry.    ED Course  Procedures (including critical care time)  Labs Reviewed - No data to display No results found.   No diagnosis found.   Date: 11/14/2012  Rate: 91  Rhythm: normal sinus rhythm  QRS Axis: normal  Intervals: normal  ST/T Wave abnormalities: normal  Conduction Disutrbances:right bundle branch block  Narrative Interpretation:   Old EKG Reviewed: unchanged    MDM  The patient presents here with progressive weakness, dyspnea, increased confusion / disorientation.  The wife says he gets like this when his CO2 builds up.  The workup today reveals a respiratory acidosis with CO2 of 102.  He was given steroids and solumedrol.  I have spoken with PCCM about this patient.  Dr. Sung Amabile knows him well and will see him in the ED.  He will likely start bipap and admit him to the ICU.          Geoffery Lyons, MD 11/14/12 [phone]

## 2012-11-14 NOTE — Progress Notes (Signed)
RT called for ABG. Pt currently being transported to Xray. RT to draw once Pt returns. RT will continue to monitor.

## 2012-11-15 DIAGNOSIS — J441 Chronic obstructive pulmonary disease with (acute) exacerbation: Secondary | ICD-10-CM

## 2012-11-15 LAB — BLOOD GAS, ARTERIAL
Drawn by: 331761
Inspiratory PAP: 17
RATE: 10 resp/min
pCO2 arterial: 89 mmHg (ref 35.0–45.0)
pH, Arterial: 7.34 — ABNORMAL LOW (ref 7.350–7.450)

## 2012-11-15 LAB — BASIC METABOLIC PANEL
BUN: 44 mg/dL — ABNORMAL HIGH (ref 6–23)
Chloride: 96 mEq/L (ref 96–112)
Creatinine, Ser: 1.95 mg/dL — ABNORMAL HIGH (ref 0.50–1.35)
Glucose, Bld: 121 mg/dL — ABNORMAL HIGH (ref 70–99)
Potassium: 4.5 mEq/L (ref 3.5–5.1)

## 2012-11-15 LAB — CBC
HCT: 29.6 % — ABNORMAL LOW (ref 39.0–52.0)
Hemoglobin: 8.6 g/dL — ABNORMAL LOW (ref 13.0–17.0)
MCHC: 29.1 g/dL — ABNORMAL LOW (ref 30.0–36.0)
MCV: 97.4 fL (ref 78.0–100.0)
WBC: 4.5 10*3/uL (ref 4.0–10.5)

## 2012-11-15 LAB — GLUCOSE, CAPILLARY
Glucose-Capillary: 130 mg/dL — ABNORMAL HIGH (ref 70–99)
Glucose-Capillary: 121 mg/dL — ABNORMAL HIGH (ref 70–99)
Glucose-Capillary: 107 mg/dL — ABNORMAL HIGH (ref 70–99)
Glucose-Capillary: 244 mg/dL — ABNORMAL HIGH (ref 70–99)
Glucose-Capillary: 216 mg/dL — ABNORMAL HIGH (ref 70–99)

## 2012-11-15 LAB — MAGNESIUM: Magnesium: 2.1 mg/dL (ref 1.5–2.5)

## 2012-11-15 MED ORDER — TEMAZEPAM 7.5 MG PO CAPS
7.5000 mg | ORAL_CAPSULE | Freq: Every day | ORAL | Status: DC
  Administered 2012-11-15: 7.5 mg via ORAL
  Filled 2012-11-15: qty 1

## 2012-11-15 MED ORDER — FUROSEMIDE 10 MG/ML IJ SOLN
40.0000 mg | Freq: Once | INTRAMUSCULAR | Status: AC
  Administered 2012-11-15: 40 mg via INTRAVENOUS
  Filled 2012-11-15: qty 4

## 2012-11-15 MED ORDER — INSULIN ASPART 100 UNIT/ML ~~LOC~~ SOLN
0.0000 [IU] | Freq: Three times a day (TID) | SUBCUTANEOUS | Status: DC
  Administered 2012-11-15 (×2): 3 [IU] via SUBCUTANEOUS
  Administered 2012-11-16 (×2): 1 [IU] via SUBCUTANEOUS
  Administered 2012-11-17: 2 [IU] via SUBCUTANEOUS
  Administered 2012-11-17: 3 [IU] via SUBCUTANEOUS
  Administered 2012-11-18: 2 [IU] via SUBCUTANEOUS

## 2012-11-15 NOTE — Progress Notes (Signed)
PULMONARY  / CRITICAL CARE MEDICINE  Name: [name] [name] MRN: [id number] DOB: 05-12-1951    ADMISSION DATE:  11/14/2012 CONSULTATION DATE:  11/14/2012  REFERRING MD :  [name] [name]   CHIEF COMPLAINT:  Altered Mental Status  BRIEF PATIENT DESCRIPTION: 62 year old male with h/o COPD (on 3L home 02), CAD, OSA, CHF, and DM. Has had a gradual increase in confusion over the past 3 days (4/1 - 4/3). 4/2 fell while home alone and was on floor for several hours. Presents 4/3 to [facility] ED.   SIGNIFICANT EVENTS / STUDIES:    LINES / TUBES: PIV  CULTURES:   ANTIBIOTICS:  SUBJECTIVE:  Confusion has markedly improved and patient states he is breathing fine. He feels much better.   VITAL SIGNS: Temp:  [97.9 F (36.6 C)-99.5 F (37.5 C)] 98.3 F (36.8 C) (04/04 0737) Pulse Rate:  [75-98] 75 (04/04 0745) Resp:  [15-33] 24 (04/04 0745) BP: (112-166)/(48-85) 166/79 mmHg (04/04 1003) SpO2:  [93 %-100 %] 97 % (04/04 0745) FiO2 (%):  [30 %-50 %] 30 % (04/04 0745) Weight:  [143.8 kg (317 lb 0.3 oz)] 143.8 kg (317 lb 0.3 oz) (04/04 0348) 2 liters  HEMODYNAMICS:   VENTILATOR SETTINGS: Vent Mode:  [-]  FiO2 (%):  [30 %-50 %] 30 % INTAKE / OUTPUT: Intake/Output     04/03 0701 - 04/04 0700 04/04 0701 - 04/05 0700   I.V. (mL/kg) 130 (0.9)    Total Intake(mL/kg) 130 (0.9)    Urine (mL/kg/hr) 1475 (0.4) 300 (0.4)   Total Output 1475 300   Net -1345 -300          PHYSICAL EXAMINATION: General:  Obese, acutely ill appearing male.  Neuro:  AAOx4. Responds appropriately and follows commands.  HEENT:  [place], PERRL, JVP difficult to assess due to nick girth.  Cardiovascular: RRR, +1 nonpitting edema BLE Lungs:  Occasional Rhonchi.  Abdomen:  Soft, non-tender. Musculoskeletal:  Intact Skin:  Intact  LABS:  Recent Labs Lab 11/14/12 0740 11/14/12 0819 11/14/12 2006 11/15/12 0420 11/15/12 0503  HGB 9.5*  --   --  8.6*  --   WBC 5.0  --   --  4.5  --   PLT 94*  --   --  84*  --   NA 143  --    --  145  --   K 5.0  --   --  4.5  --   CL 96  --   --  96  --   CO2 42*  --   --  44*  --   GLUCOSE 85  --   --  121*  --   BUN 35*  --   --  44*  --   CREATININE 1.91*  --   --  1.95*  --   CALCIUM 9.1  --   --  9.1  --   MG  --   --   --  2.1  --   PHOS  --   --   --  1.8*  --   AST 15  --   --   --   --   ALT 12  --   --   --   --   ALKPHOS 69  --   --   --   --   BILITOT 0.3  --   --   --   --   PROT 6.8  --   --   --   --  ALBUMIN 3.4*  --   --   --   --   TROPONINI <0.30  --   --   --   --   PROBNP 539.9*  --   --   --   --   PHART  --  7.259* 7.325*  --  7.340*  PCO2ART  --  102.2* 84.9*  --  89.0*  PO2ART  --  64.0* 59.0*  --  69.2*    Recent Labs Lab 11/14/12 2006 11/15/12 0014 11/15/12 0410 11/15/12 0815  GLUCAP 176* 130* 121* 107*    BNP    Component Value Date/Time   PROBNP 539.9* 11/14/2012 0740    CXR: 4/3 Bilateral airspace disease. Bibasilar opacities ATX vs Edema  ASSESSMENT / PLAN:  PULMONARY A:Acute on chronic hypercarbic and hypoxemic respiratory failure - Severe underlying restrictive physiology due to obesity and chronic pleural disease, likely element of secondary PAH as exam supports element of decompensated cor pulmonale.   OSA Pulmonary edema P:   NPPV as needed during day. Mandatory BiPAP @ HS Diurese - lasix 4/4 scheduled BDs   CARDIOVASCULAR A: H/o diastolic CHF     Prob cor pulmonale  P:  -Lasix x 1 -Last echo showed diastolic dysfxn but body habitus prevented estimate of PA pressures and good look at RV fxn. This was done in March 2014 so will defer another echo for now as not likely to assist in his care  RENAL A:  Chronic Kidney Disease P:   Close monitor of renal status in setting of diuresis.  BMP Daily  ENDOCRINE A:  DM P:   SSI  NEUROLOGIC A:  Acute Encephalopathy (resolved) P:   Supportive Care  TODAY'S SUMMARY: [name] [name] has a 3 day history of increasing confusion. This has happened before when he is  hypercarbic. He was admitted with acute on chronic hypercarbic resp failure due to restrictive > obstructive physiology on the basis of obesity and chronic pleural disease. His mentation has improved and he is breathing comfortably on 2L [place].   If tolerates off BiPAP all day today, would transfer out of SDU 4/5 and work towards discharge. Will need to emphasize importance of compliance with nocturnal BiPAP after discharge   [name] Fischer, MD ; [facility] service Mobile [phone].  After 5:30 PM or weekends, call [phone]

## 2012-11-15 NOTE — Progress Notes (Signed)
CRITICAL VALUE ALERT  Critical value received:  Panic ABG CO2-89  Date of notification:  11/15/12  Time of notification:  0600  Critical value read back: yes  Nurse who received alert:  M.Foster Simpson, RN  MD notified (1st page):  MD aware  Time of first page:    MD notified (2nd page):  Time of second page:  Responding MD:    Time MD responded:

## 2012-11-15 NOTE — Progress Notes (Signed)
eLink Physician-Brief Progress Note Patient Name: [name] [name] DOB: [birth year] MRN: [id number]  Date of Service  11/15/2012   HPI/Events of Note   Insomnia. On camer care restless.   eICU Interventions  restroril x 1   Intervention Category Minor Interventions: Other:  Malee Grays 11/15/2012, 1:09 AM

## 2012-11-16 ENCOUNTER — Inpatient Hospital Stay (HOSPITAL_COMMUNITY): Payer: Medicare Other

## 2012-11-16 DIAGNOSIS — I509 Heart failure, unspecified: Secondary | ICD-10-CM

## 2012-11-16 LAB — BASIC METABOLIC PANEL
BUN: 45 mg/dL — ABNORMAL HIGH (ref 6–23)
GFR calc Af Amer: 44 mL/min — ABNORMAL LOW (ref >90)
GFR calc non Af Amer: 38 mL/min — ABNORMAL LOW (ref >90)
Potassium: 4 mEq/L (ref 3.5–5.1)
Sodium: 145 mEq/L (ref 135–145)

## 2012-11-16 LAB — CBC
Hemoglobin: 8.8 g/dL — ABNORMAL LOW (ref 13.0–17.0)
MCHC: 28.7 g/dL — ABNORMAL LOW (ref 30.0–36.0)

## 2012-11-16 NOTE — Progress Notes (Signed)
Pt transferred to 6731; O2 100% on 3L; HR 63; pt settled in chair; report given to RN on 6700; all questions answered; no complications noted with transport

## 2012-11-16 NOTE — Progress Notes (Signed)
CRITICAL VALUE ALERT  Critical value received:  CO2 - 44  Date of notification:  11/16/2012  Time of notification:  0735  Critical value read back:yes  Nurse who received alert:  Christophe Louis  MD notified (1st page):  Dr. Delton Coombes  Time of first page:  0750  MD notified (2nd page):  Time of second page:  Responding MD:  Dr. Delton Coombes  Time MD responded:  [phone]

## 2012-11-16 NOTE — Progress Notes (Signed)
PULMONARY  / CRITICAL CARE MEDICINE  Name: [name] [name] MRN: [id number] DOB: Jun 30, 1951    ADMISSION DATE:  11/14/2012 CONSULTATION DATE:  11/14/2012  REFERRING MD :  Judd Lien   CHIEF COMPLAINT:  Altered Mental Status  BRIEF PATIENT DESCRIPTION: 62 year old male with h/o COPD (on 3L home 02), CAD, OSA, CHF, and DM. Has had a gradual increase in confusion over the past 3 days (4/1 - 4/3). 4/2 fell while home alone and was on floor for several hours. Presents 4/3 to [facility] ED.   SIGNIFICANT EVENTS / STUDIES:    LINES / TUBES: PIV  CULTURES:   ANTIBIOTICS:  SUBJECTIVE:  Confusion has markedly improved and patient states he is breathing fine. He feels much better. Denies acute needs. Admits sometimes he falls asleep in chair w/o putting BIPAP on- "don't put the hours in on it like I need to".  VITAL SIGNS: Temp:  [97.2 F (36.2 C)-98.4 F (36.9 C)] 97.8 F (36.6 C) (04/05 0738) Pulse Rate:  [64-99] 69 (04/05 0800) Resp:  [15-34] 15 (04/05 0800) BP: (112-166)/(48-79) 132/58 mmHg (04/05 0800) SpO2:  [92 %-100 %] 100 % (04/05 0819) FiO2 (%):  [30 %-32 %] 32 % (04/05 0819) 2 liters  HEMODYNAMICS:   VENTILATOR SETTINGS: Vent Mode:  [-]  FiO2 (%):  [30 %-32 %] 32 % INTAKE / OUTPUT: Intake/Output     04/04 0701 - 04/05 0700 04/05 0701 - 04/06 0700   P.O.  240   I.V. (mL/kg) 160 (1.1)    Total Intake(mL/kg) 160 (1.1) 240 (1.7)   Urine (mL/kg/hr) 3375 (1)    Total Output 3375     Net -3215 +240          PHYSICAL EXAMINATION: General:  Obese, comfortable appearing male. Up in chair reading. Cooperative Neuro:  AAOx4. Responds appropriately and follows commands.  HEENT:  [place], PERRL, JVP difficult to assess due to neck girth.  Cardiovascular: RRR, no edema BLE- decreased skin turgor Lungs:  Quiet, unlabored.  Abdomen:  Soft, non-tender. Musculoskeletal:  Intact Skin:  Intact  LABS:  Recent Labs Lab 11/14/12 0740 11/14/12 0819 11/14/12 2006 11/15/12 0420  11/15/12 0503 11/16/12 0550  HGB 9.5*  --   --  8.6*  --  8.8*  WBC 5.0  --   --  4.5  --  3.5*  PLT 94*  --   --  84*  --  89*  NA 143  --   --  145  --  145  K 5.0  --   --  4.5  --  4.0  CL 96  --   --  96  --  96  CO2 42*  --   --  44*  --  44*  GLUCOSE 85  --   --  121*  --  98  BUN 35*  --   --  44*  --  45*  CREATININE 1.91*  --   --  1.95*  --  1.86*  CALCIUM 9.1  --   --  9.1  --  9.0  MG  --   --   --  2.1  --   --   PHOS  --   --   --  1.8*  --   --   AST 15  --   --   --   --   --   ALT 12  --   --   --   --   --  ALKPHOS 69  --   --   --   --   --   BILITOT 0.3  --   --   --   --   --   PROT 6.8  --   --   --   --   --   ALBUMIN 3.4*  --   --   --   --   --   TROPONINI <0.30  --   --   --   --   --   PROBNP 539.9*  --   --   --   --   --   PHART  --  7.259* 7.325*  --  7.340*  --   PCO2ART  --  102.2* 84.9*  --  89.0*  --   PO2ART  --  64.0* 59.0*  --  69.2*  --     Recent Labs Lab 11/15/12 0815 11/15/12 1211 11/15/12 1559 11/15/12 2003 11/16/12 0815  GLUCAP 107* 118* 244* 216* 107*    BNP    Component Value Date/Time   PROBNP 539.9* 11/14/2012 0740    CXR: 4/3 Bilateral airspace disease. Bibasilar opacities ATX vs Edema  ASSESSMENT / PLAN:  PULMONARY A:Acute on chronic hypercarbic and hypoxemic respiratory failure - Severe underlying restrictive physiology due to obesity and chronic pleural disease, likely element of secondary PAH as exam supports element of decompensated cor pulmonale.  Decompensates when fluid retention and when not using BiPAP for sleep OSA Pulmonary edema P:   NPPV as needed during day. Mandatory BiPAP @ HS Diurese - lasix 4/4 scheduled BDs   CARDIOVASCULAR A: H/o diastolic CHF     Prob cor pulmonale  P:  -Lasix x 1 -Last echo showed diastolic dysfxn but body habitus prevented estimate of PA pressures and good look at RV fxn. This was done in March 2014 so will defer another echo for now as not likely to assist in his  care  RENAL A:  Chronic Kidney Disease Cr 4/5 1.86 P:   Close monitor of renal status in setting of diuresis.  BMP Daily  ENDOCRINE A:  DM P:   SSI  NEUROLOGIC A:  Acute Encephalopathy (resolved) P:   Supportive Care  TODAY'S SUMMARY: Mr Kesinger has a 3 day history of increasing confusion. This has happened before when he is hypercarbic. He was admitted with acute on chronic hypercarbic resp failure due to restrictive > obstructive physiology on the basis of obesity and chronic pleural disease. His mentation has improved and he is breathing comfortably on 2L [place]. Needs to use BIPAP all night, every night.  Off BiPAP during day, could transfer out of SDU 4/5 and work towards discharge. Will need to emphasize importance of compliance with nocturnal BiPAP after discharge   CD Maple Hudson, MD ; [facility] service Mobile [phone].  After 3:00 PM, call [phone]

## 2012-11-17 DIAGNOSIS — D649 Anemia, unspecified: Secondary | ICD-10-CM

## 2012-11-17 DIAGNOSIS — N179 Acute kidney failure, unspecified: Secondary | ICD-10-CM

## 2012-11-17 LAB — GLUCOSE, CAPILLARY
Glucose-Capillary: 131 mg/dL — ABNORMAL HIGH (ref 70–99)
Glucose-Capillary: 204 mg/dL — ABNORMAL HIGH (ref 70–99)
Glucose-Capillary: 110 mg/dL — ABNORMAL HIGH (ref 70–99)

## 2012-11-17 NOTE — Progress Notes (Signed)
Patient placed on CPAP 12cmH20.  Patient tolerating well at this time.

## 2012-11-17 NOTE — Progress Notes (Signed)
PULMONARY  / CRITICAL CARE MEDICINE  Name: [name] [name] MRN: [id number] DOB: 16-Oct-1950    ADMISSION DATE:  11/14/2012 CONSULTATION DATE:  11/14/2012  REFERRING MD :  [name] [name]   CHIEF COMPLAINT:  Altered Mental Status  BRIEF PATIENT DESCRIPTION: 62 year old male with h/o COPD (on 3L home 02), CAD, OSA, CHF, and DM. Has had a gradual increase in confusion over the past 3 days (4/1 - 4/3). 4/2 fell while home alone and was on floor for several hours. Presents 4/3 to [facility] ED.   SIGNIFICANT EVENTS / STUDIES:    LINES / TUBES: PIV  CULTURES:   ANTIBIOTICS:  SUBJECTIVE:  Confusion has markedly improved and patient states he is breathing fine. He feels much better. Denies acute needs. Admits sometimes he falls asleep in chair w/o putting BIPAP on- "don't put the hours in on it like I need to". Says RT put BIPAP pressure on 190 last night- "too low". Home pressure is 12/12 w/ 3L bleed in.  VITAL SIGNS: Temp:  [98 F (36.7 C)-98.6 F (37 C)] 98.6 F (37 C) (04/06 0443) Pulse Rate:  [68-81] 76 (04/06 0443) Resp:  [18-27] 18 (04/06 0443) BP: (121-167)/(61-81) 167/81 mmHg (04/06 0443) SpO2:  [95 %-100 %] 100 % (04/06 0443) FiO2 (%):  [32 %] 32 % (04/05 0819) Weight:  [143.875 kg (317 lb 3 oz)] 143.875 kg (317 lb 3 oz) (04/05 2015) 2 liters  HEMODYNAMICS:   VENTILATOR SETTINGS: Vent Mode:  [-]  FiO2 (%):  [32 %] 32 % INTAKE / OUTPUT: Intake/Output     04/05 0701 - 04/06 0700 04/06 0701 - 04/07 0700   P.O. 1440    I.V. (mL/kg)     Total Intake(mL/kg) 1440 (10)    Urine (mL/kg/hr) 2500 (0.7)    Total Output 2500     Net -1060          Stool Occurrence       PHYSICAL EXAMINATION: General:  Obese, comfortable appearing male. Up in chair eating. Cooperative Neuro:  AAOx4. Responds appropriately and follows commands.  HEENT:  [name] [name], PERRL, JVP difficult to assess due to neck girth.  Cardiovascular: RRR, no edema BLE- decreased skin turgor, not pitting Lungs:  Quiet, unlabored,  distant Abdomen:  Soft, non-tender. Musculoskeletal:  Intact Skin:  Intact  LABS:  Recent Labs Lab 11/14/12 0740 11/14/12 0819 11/14/12 2006 11/15/12 0420 11/15/12 0503 11/16/12 0550  HGB 9.5*  --   --  8.6*  --  8.8*  WBC 5.0  --   --  4.5  --  3.5*  PLT 94*  --   --  84*  --  89*  NA 143  --   --  145  --  145  K 5.0  --   --  4.5  --  4.0  CL 96  --   --  96  --  96  CO2 42*  --   --  44*  --  44*  GLUCOSE 85  --   --  121*  --  98  BUN 35*  --   --  44*  --  45*  CREATININE 1.91*  --   --  1.95*  --  1.86*  CALCIUM 9.1  --   --  9.1  --  9.0  MG  --   --   --  2.1  --   --   PHOS  --   --   --  1.8*  --   --  AST 15  --   --   --   --   --   ALT 12  --   --   --   --   --   ALKPHOS 69  --   --   --   --   --   BILITOT 0.3  --   --   --   --   --   PROT 6.8  --   --   --   --   --   ALBUMIN 3.4*  --   --   --   --   --   TROPONINI <0.30  --   --   --   --   --   PROBNP 539.9*  --   --   --   --   --   PHART  --  7.259* 7.325*  --  7.340*  --   PCO2ART  --  102.2* 84.9*  --  89.0*  --   PO2ART  --  64.0* 59.0*  --  69.2*  --     Recent Labs Lab 11/15/12 1211 11/15/12 1559 11/15/12 2003 11/16/12 0815 11/16/12 1250  GLUCAP 118* 244* 216* 107* 146*    BNP    Component Value Date/Time   PROBNP 539.9* 11/14/2012 0740    CXR: 4/3 Bilateral airspace disease. Bibasilar opacities ATX vs Edema  ASSESSMENT / PLAN:  PULMONARY A:Acute on chronic hypercarbic and hypoxemic respiratory failure - Severe underlying restrictive physiology due to obesity and chronic pleural disease, likely element of secondary PAH as exam supports element of decompensated cor pulmonale.  Decompensates when fluid retention and when not using BiPAP for sleep.Continuous O2 3L. OSA Pulmonary edema P:   NPPV as needed during day. Mandatory BiPAP 12/12 w 3L/ O2@ HS Diurese - lasix 4/4 scheduled BDs   CARDIOVASCULAR A: H/o diastolic CHF     Prob cor pulmonale  P:  -Lasix x 1 -Last  echo showed diastolic dysfxn but body habitus prevented estimate of PA pressures and good look at RV fxn. This was done in March 2014 so will defer another echo for now as not likely to assist in his care  RENAL A:  Chronic Kidney Disease Cr 4/5 1.86. Trending better despite diuresis.  P:   Close monitor of renal status in setting of diuresis.  BMP Daily  ENDOCRINE A:  DM P:   SSI  NEUROLOGIC A:  Acute Encephalopathy (resolved) P:   Supportive Care Recheck before discharge  Anemia-  A:H/o GI bleed, Dr [name] [name] P: Stool OB, recheck CBC before discharge    TODAY'S SUMMARY: Mr [name] [name] has a 3 day history of increasing confusion. This has happened before when he is hypercarbic. He was admitted with acute on chronic hypercarbic resp failure due to restrictive > obstructive physiology on the basis of obesity and chronic pleural disease. His mentation has improved and he is breathing comfortably on 3L [place]. Needs to use BIPAP all night, every night.  Off BiPAP during day, Anticipate d/c 4/7. Will need to emphasize importance of compliance with nocturnal BiPAP after discharge   CD [name] Hudson, MD ; [facility] service Mobile [phone].  After 3:00 PM, call [phone]

## 2012-11-18 ENCOUNTER — Other Ambulatory Visit: Payer: Self-pay | Admitting: Pulmonary Disease

## 2012-11-18 DIAGNOSIS — N189 Chronic kidney disease, unspecified: Secondary | ICD-10-CM

## 2012-11-18 LAB — CBC WITH DIFFERENTIAL/PLATELET
Eosinophils Relative: 2 % (ref 0–5)
HCT: 35.4 % — ABNORMAL LOW (ref 39.0–52.0)
Hemoglobin: 10.5 g/dL — ABNORMAL LOW (ref 13.0–17.0)
Lymphocytes Relative: 27 % (ref 12–46)
Lymphs Abs: 0.9 10*3/uL (ref 0.7–4.0)
MCV: 93.7 fL (ref 78.0–100.0)
Monocytes Absolute: 0.4 10*3/uL (ref 0.1–1.0)
Monocytes Relative: 13 % — ABNORMAL HIGH (ref 3–12)
Neutro Abs: 1.9 10*3/uL (ref 1.7–7.7)
RBC: 3.78 MIL/uL — ABNORMAL LOW (ref 4.22–5.81)
RDW: 13.7 % (ref 11.5–15.5)
WBC: 3.2 10*3/uL — ABNORMAL LOW (ref 4.0–10.5)

## 2012-11-18 LAB — BASIC METABOLIC PANEL
BUN: 36 mg/dL — ABNORMAL HIGH (ref 6–23)
CO2: 41 mEq/L (ref 19–32)
Calcium: 9.5 mg/dL (ref 8.4–10.5)
Chloride: 94 mEq/L — ABNORMAL LOW (ref 96–112)
Creatinine, Ser: 1.71 mg/dL — ABNORMAL HIGH (ref 0.50–1.35)
Glucose, Bld: 79 mg/dL (ref 70–99)

## 2012-11-18 MED ORDER — FUROSEMIDE 40 MG PO TABS: 40.0000 mg | ORAL_TABLET | Freq: Every day | ORAL | Status: DC

## 2012-11-18 MED ORDER — TEMAZEPAM 7.5 MG PO CAPS: 7.5000 mg | ORAL_CAPSULE | Freq: Every evening | ORAL | Status: DC | PRN

## 2012-11-18 MED ORDER — FLUTICASONE-SALMETEROL 250-50 MCG/DOSE IN AEPB: 1.0000 | INHALATION_SPRAY | Freq: Two times a day (BID) | RESPIRATORY_TRACT | Status: DC

## 2012-11-18 NOTE — Progress Notes (Signed)
AVS reviewed with pt and wife at bedside.; teach back method used. Pt verbalized understanding of AVS and questions were answered. Rx given. Pt expressed need for RX for Advair. Dr. Maple Hudson came to bedside and stated he would e-script RX for Advair to pt's pharmacy [facility] on [address] IV removed. Pt remains stable. Pt transported via wheelchair to exit of facility. [place], Rosanna Randy

## 2012-11-18 NOTE — Discharge Summary (Signed)
Physician Discharge Summary  Patient ID: [name] [name] MRN: [id number] DOB/AGE: [birth year] 61 y.o.  Admit date: 11/14/2012 Discharge date: 11/18/2012    Discharge Diagnoses:  Principal Problem:   Acute-on-chronic respiratory failure Active Problems:   OBESITY HYPOVENTILATION SYNDROME    Brief Summary: [name] [name] is a 62 year old male with h/o COPD (on 3L home 02), CAD, OSA, CHF, and DM. Has had a gradual increase in confusion over the past 3 days (4/1 - 4/3). 4/2 fell while home alone and was on floor for several hours. Presents 4/3 to [facility] ED.  Admitted with acute on chronic respiratory failure in the setting of decompensated OSA/OHS, pulmonary edema and acute encephalopathy r/t hypercarbia.   Consults: None  Lines/tubes: none  Microbiology/Sepsis markers: None  Significant Diagnostic Studies:  none  **For oupt pulmonary follow-up:  Needs BMET  Needs to clarify/educate regarding advair/spiriva dosing and frequency, in the past states he has used it PRN                                                                   Hospital Summary by Discharge Diagnosis  Acute on chronic hypercarbic and hypoxemic respiratory failure OSA Pulmonary edema  Severe underlying restrictive physiology due to obesity and chronic pleural disease, likely element of secondary PAH as exam supports element of decompensated cor pulmonale. Decompensates when fluid retention and when not using BiPAP for sleep. Improved with IV lasix, continuous bipap and is now tolerating qhs Bipap with previous home settings (mandatory qhs bipap 12/12 with 3L O2).  He has been encouraged to wear bipap every night and if "napping in the chair" during the day. He will cont 3L [place] continuous as previously.  Will cont previous advair/spiriva and PRN albuterol and have him f/u in pulm office in 2 weeks.       Hx diastolic CHF Cor pulmonale - probable  Last echo showed diastolic dysfxn but body habitus prevented  estimate of PA pressures and good look at RV fxn. Likely volume overloaded on admission but also acute on chronic renal insufficiency.  Overload improved with intermittent IV lasix.  Will d/c on lasic 40 daily with close outpt monitoring of renal fxn.  Was previously changed to 80mg  daily but appears Scr did not tolerate.    CKD Scr improving with lower dose lasix.  See above.  F/u bmet as outpt.  See [place] as outpt will arrange f/u.    DM Controlled.    Acute encephalopathy  R/t hypercarbia.  Much improved.  See above re: bipap.   Anemia - Hx GI bleed.  Stool checked for occult blood and serial cbc's followed with stable hgb.    Filed Vitals:   11/17/12 1935 11/17/12 2114 11/18/12 0557 11/18/12 0854  BP:  119/58 149/77 129/63  Pulse:  74 68 84  Temp:  98.1 F (36.7 C) 97.7 F (36.5 C) 98.7 F (37.1 C)  TempSrc:  Oral Oral   Resp:  20 20 20   Height:      Weight:  328 lb 14.4 oz (149.188 kg)    SpO2: 100% 100% 100% 97%     Discharge Labs  BMET  Recent Labs Lab 11/14/12 0740 11/15/12 0420 11/16/12 0550 11/18/12 0540  NA 143 145 145  142  K 5.0 4.5 4.0 4.8  CL 96 96 96 94*  CO2 42* 44* 44* 41*  GLUCOSE 85 121* 98 79  BUN 35* 44* 45* 36*  CREATININE 1.91* 1.95* 1.86* 1.71*  CALCIUM 9.1 9.1 9.0 9.5  MG  --  2.1  --   --   PHOS  --  1.8*  --   --      CBC   Recent Labs Lab 11/15/12 0420 11/16/12 0550 11/18/12 0540  HGB 8.6* 8.8* 10.5*  HCT 29.6* 30.7* 35.4*  WBC 4.5 3.5* 3.2*  PLT 84* 89* 107*   Anti-Coagulation No results found for this basename: INR,  in the last 168 hours    Discharge Orders   Future Appointments Provider Department Dept Phone   12/02/2012 9:00 AM Julio Sicks, NP [facility] [phone]   12/03/2012 2:00 PM [facility] [phone]   12/24/2012 2:45 PM Waymon Budge, MD [facility] [phone]   Future Orders Complete By Expires     Diet -  low sodium heart healthy  As directed     Discharge instructions  As directed     Comments:      Mandatory bipap at night and with sleep during day  Continuous oxygen 3Liters nasal cannula    Increase activity slowly  As directed             Follow-up Information   Follow up with PARRETT,TAMMY, NP On 12/02/2012. (9:00am )    Contact information:   [address] [place] [address] [phone]       Follow up with Waymon Budge, MD On 12/24/2012. (2:45pm )    Contact information:   [address]  [facility] [place] [address] [phone]       Follow up with COLADONATO,JOSEPH A, MD. Schedule an appointment as soon as possible for a visit in 2 weeks.   Contact information:   [address] [facility] [place] [address] [phone]          Medication List    TAKE these medications       albuterol 108 (90 BASE) MCG/ACT inhaler  Commonly known as:  PROVENTIL HFA;VENTOLIN HFA  Inhale 2 puffs into the lungs every 6 (six) hours as needed. For wheezing     albuterol (2.5 MG/3ML) 0.083% nebulizer solution  Commonly known as:  PROVENTIL  Take 3 mLs (2.5 mg total) by nebulization every 6 (six) hours as needed for wheezing or shortness of breath.     aspirin EC 81 MG tablet  Take 81 mg by mouth daily.     BAYER CONTOUR TEST test strip  Generic drug:  glucose blood     clopidogrel 75 MG tablet  Commonly known as:  PLAVIX  Take 75 mg by mouth daily.     COLCRYS 0.6 MG tablet  Generic drug:  colchicine  Take 1 tablet by mouth daily.     Fluticasone-Salmeterol 250-50 MCG/DOSE Aepb  Commonly known as:  ADVAIR  Inhale 1 puff into the lungs 2 (two) times daily. For wheezing     furosemide 40 MG tablet  Commonly known as:  LASIX  Take 1 tablet (40 mg total) by mouth daily.     isosorbide mononitrate 30 MG 24 hr tablet  Commonly known as:  IMDUR  Take 45 mg by mouth daily.     metoprolol tartrate 25 MG tablet  Commonly known as:  LOPRESSOR  Take 12.5 mg by mouth 2 (two) times daily.     multivitamin with minerals Tabs  Take 1 tablet by mouth daily.     nitroGLYCERIN 0.4 MG SL tablet  Commonly known as:  NITROSTAT  Place 0.4 mg under the tongue every 5 (five) minutes as needed. For chest pain     pantoprazole 40 MG tablet  Commonly known as:  PROTONIX  Take 1 tablet by mouth daily.     rosuvastatin 20 MG tablet  Commonly known as:  CRESTOR  Take 20 mg by mouth daily.     tamsulosin 0.4 MG Caps  Commonly known as:  FLOMAX  Take 1 tablet by mouth daily.     temazepam 7.5 MG capsule  Commonly known as:  RESTORIL  Take 1 capsule (7.5 mg total) by mouth at bedtime as needed for sleep.     tiotropium 18 MCG inhalation capsule  Commonly known as:  SPIRIVA  Place 1 capsule (18 mcg total) into inhaler and inhale daily. For wheezing          Disposition: 01-Home or Self Care  Discharged Condition: [name] [name] has met maximum benefit of inpatient care and is medically stable and cleared for discharge.  Patient is pending follow up as above.      Time spent on disposition:  Greater than 35 minutes.   SignedDanford Bad, NP 11/18/2012  9:45 AM Pager: [phone] or [phone]  *Care during the described time interval was provided by me and/or other providers on the critical care team. I have reviewed this patient's available data, including medical history, events of note, physical examination and test results as part of my evaluation.      Reviewed above, and agree.  He reports he may be running low on his advair.  I have sent an electronic prescription to his pharmacy for refill of this.  Coralyn Helling, MD [facility] Pulmonary/Critical Care 11/18/2012, 2:01 PM Pager:  [phone] After 3pm call: [phone]

## 2012-12-02 ENCOUNTER — Encounter: Payer: Self-pay | Admitting: Adult Health

## 2012-12-02 ENCOUNTER — Encounter: Payer: Self-pay | Admitting: *Deleted

## 2012-12-02 ENCOUNTER — Ambulatory Visit (INDEPENDENT_AMBULATORY_CARE_PROVIDER_SITE_OTHER): Payer: Medicare Other | Admitting: Adult Health

## 2012-12-02 ENCOUNTER — Ambulatory Visit (INDEPENDENT_AMBULATORY_CARE_PROVIDER_SITE_OTHER)
Admission: RE | Admit: 2012-12-02 | Discharge: 2012-12-02 | Disposition: A | Discharge status: Home / Self Care | Payer: Medicare Other | Source: Ambulatory Visit | Attending: Adult Health | Admitting: Adult Health

## 2012-12-02 ENCOUNTER — Other Ambulatory Visit (INDEPENDENT_AMBULATORY_CARE_PROVIDER_SITE_OTHER): Payer: Medicare Other

## 2012-12-02 VITALS — BP 142/90 | HR 91 | Temp 98.5°F | Ht 73.0 in | Wt 320.6 lb

## 2012-12-02 DIAGNOSIS — J449 Chronic obstructive pulmonary disease, unspecified: Secondary | ICD-10-CM

## 2012-12-02 DIAGNOSIS — J4489 Other specified chronic obstructive pulmonary disease: Secondary | ICD-10-CM

## 2012-12-02 DIAGNOSIS — I519 Heart disease, unspecified: Secondary | ICD-10-CM

## 2012-12-02 DIAGNOSIS — R0602 Shortness of breath: Secondary | ICD-10-CM

## 2012-12-02 DIAGNOSIS — J9610 Chronic respiratory failure, unspecified whether with hypoxia or hypercapnia: Secondary | ICD-10-CM

## 2012-12-02 DIAGNOSIS — G4733 Obstructive sleep apnea (adult) (pediatric): Secondary | ICD-10-CM

## 2012-12-02 LAB — BASIC METABOLIC PANEL
BUN: 34 mg/dL — ABNORMAL HIGH (ref 6–23)
Chloride: 92 mEq/L — ABNORMAL LOW (ref 96–112)
GFR: 44.2 mL/min — ABNORMAL LOW (ref >60.00)
Glucose, Bld: 111 mg/dL — ABNORMAL HIGH (ref 70–99)
Potassium: 4.7 mEq/L (ref 3.5–5.1)
Sodium: 145 mEq/L (ref 135–145)

## 2012-12-02 MED ORDER — IPRATROPIUM BROMIDE 0.02 % IN SOLN: 500.0000 ug | Freq: Four times a day (QID) | RESPIRATORY_TRACT | Status: DC

## 2012-12-02 MED ORDER — BUDESONIDE 0.25 MG/2ML IN SUSP: 0.2500 mg | Freq: Two times a day (BID) | RESPIRATORY_TRACT | Status: DC

## 2012-12-02 NOTE — Assessment & Plan Note (Signed)
Wear BIPAP with naps and all night sleeping.  Adjust BIPAP pressure to 13/13 -case discussed with Dr. Maple Hudson   Follow up Dr. Maple Hudson  In 2-3 weeks.  Please contact office for sooner follow up if symptoms do not improve or worsen or seek emergency care

## 2012-12-02 NOTE — Patient Instructions (Addendum)
Stop Advair and Spiriva  Begin Pulmicort Neb Twice daily   Begin Ipratropium Neb Four times a day   May use Albuterol Neb every 4-6 hr As needed  For wheezing/shortness of breath.  May use Claritin 10mg  daily As needed  Drainage  May use Nasonex 2 puffs Twice daily  Until sample is gone.  I will call with lab and xray results Need to make office visit with your family doctor regarding your Diabetes and elevated blood sugar.  Order sent to DME to increase BIPAP 13/13  Wear BIPAP with naps and all night sleeping.  Follow up Dr. Maple Hudson  In 2-3 weeks.  Please contact office for sooner follow up if symptoms do not improve or worsen or seek emergency care

## 2012-12-02 NOTE — Assessment & Plan Note (Signed)
Recent decompensation with fluid overload complicated by acute on chronic Hypercarbic resp failure  >multiple obstacles w/ cost factors /med noncompliance and BIPAP non compliance  Pt education given on importance of meds and BIPAP use  Will change inhalers to nebs to see if this helps with cost Check labs and xray   Plan  Stop Advair and Spiriva  Begin Pulmicort Neb Twice daily   Begin Ipratropium Neb Four times a day   May use Albuterol Neb every 4-6 hr As needed  For wheezing/shortness of breath.  May use Claritin 10mg  daily As needed  Drainage  May use Nasonex 2 puffs Twice daily  Until sample is gone.  I will call with lab and xray results  Wear BIPAP with naps and all night sleeping.  Follow up Dr. Maple Hudson  In 2-3 weeks.  Please contact office for sooner follow up if symptoms do not improve or worsen or seek emergency care

## 2012-12-02 NOTE — Progress Notes (Signed)
Patient ID: [name] [name], male    DOB: [birth year], 62 y.o.   MRN: [id number]  HPI 01/23/11- COPD, CAD/diastolic dysfunction, OSA, chronic respiratory failure, obesity Hypoventilation, cor pulmonale Hosp since last here- once for "shakes"-they told him wasn't using CPAP right, once for epistaxis, once to place urinary stent, and has had a right ? renal biopsy. Told anemic.  Breathing stable, but can't lie flat- feels smothered even on O2.  Runs O2 2-3L/M usually. I gave permision to go to 4 during exertion. Notes "tiresome" feeling midchest, related to exertion/ climbing stairs and relieved by rest. Told to pace himself and not try to push through that.  Discussed need for O2. He doesn't sleep well with his BiPAP 15/12. Discussed sleep hygiene- discussed room temperature. He was more comfortable with autotitration in hosp and we discussed a trial of that at home.  Has restarted diuretic since leg edema has come back.   05/25/11-  COPD, CAD/diastolic dysfunction, OSA, chronic respiratory failure, obesity Hypoventilation, cor pulmonale Went to ER last week- short of breath. Air wasn't satisfying. Was started back on shot for anemia. Feels some better.  Reports sneezing, postnasal drainage and a sense of mucus in his upper chest. Denies fever, sore throat and doesn't think he has a cold.  06/28/11-  COPD, CAD/diastolic dysfunction, OSA, chronic respiratory failure, obesity Hypoventilation, cor pulmonale Recently hospitalized October 25-29, notes reviewed with him and x-ray images reviewed by me. He was hospitalized for hypercapnic respiratory failure with transient encephalopathy and renal insufficiency. Since discharge he has regained some ankle edema while off of furosemide. He has a nephrology appointment later this month. He is concerned that he was taken off his diabetes medicines and is directed to discuss this with his primary physician immediately. He has some persistent soreness across his  anterior chest wall at the level of the xiphoid but otherwise breathing better with less exertional dyspnea and before he went in the hospital. He remains dependent on continuous oxygen at 2 L. He continues his BiPAP machine all night, every night but says he was sleeping better with it  on autotitration with a lower pressure used in the hospital.  09/25/11- COPD, CAD/diastolic dysfunction, OSA, chronic respiratory failure, obesity Hypoventilation, cor pulmonale Since last here he was hospitalized for respiratory failure with CHF and obesity hypoventilation. Better with diuresis. BiPAP 13/10 is now comfortable and used all night every night with supplemental oxygen 4 L. Tolerated colonoscopy without respiratory distress. Uses a rescue inhaler only occasionally but says it does help then.  11/23/11- COPD, CAD/diastolic dysfunction, OSA, chronic respiratory failure, obesity Hypoventilation, cor pulmonale She is noticing some increased shortness of breath on exertion such as getting in the car but no increase in ankle edema which is well controlled. Occasional cough is not progressive or productive. Noticing more rhinorrhea with the pollen. Continues BiPAP  I 13/E 10 all night every night.  02/07/12- COPD, CAD/diastolic dysfunction, OSA, chronic respiratory failure, obesity Hypoventilation, cor pulmonale  pt states doing some better.wakes up out of a nap coughing feels like he's choking.  Post Hosp- 01/14/12- 01/23/12-discharge diagnoses reviewed with him: Coronary artery disease/CABG, diastolic dysfunction, COPD with chronic respiratory failure, acute respiratory failure with hypoxia, acute on chronic renal failure, DM. He was intubated for 4 days and treated with Zosyn, vancomycin and Avelox, Levaquin, Zithromax and Rocephin. Cultures negative. He was to wean off of prednisone over 3 days. He was hoarse after intubation but that has resolved. He is using oxygen 1-2 L and  BiPAP  13/10 at night. He feels he is  pretty close to his baseline.  03/28/12-  COPD, CAD/diastolic dysfunction/ chronic CHF, OSA, chronic respiratory failure, obesity Hypoventilation, cor pulmonale  6 MWT today. Pt states breathing has been "okay" since last OV. Pt states having trouble falling alseep-not sleeping well on BiPAP. Pt states mask fit well but thinks the pressure needs to be adjusted again. Currently 13/10+ oxygen at 2 L/ Advanced. He sleeps better in a recliner with oxygen but without his BiPAP. Getting iron injections for anemia. We discussed shortness of breath and anemia. Pain left anterior axillary line described as a sore ache over the past week. He is able to raise his arm. Thinks he maybe lifted something wrong. COPD assessment test (CAT) 21/40 CXR 01/14/12- 1 view- IMPRESSION:  Stable support apparatus. Residual mild interstitial prominence  with slight improvement in aeration. No convincing pulmonary  edema. Probable small left pleural effusion with left basilar  atelectasis or infiltrate.  Original Report Authenticated By: Natasha Mead, M.D.   11/08/12-COPD, CAD/diastolic dysfunction/ chronic CHF, OSA, chronic respiratory failure, obesity Hypoventilation, cor pulmonale Hospitalized again between March 9 and 12 for acute on chronic renal failure with congestive heart failure, chronic respiratory failure on BiPAP and acute respiratory failure with hypoxia. He says he is feeling better at this time. Continues oxygen 3 L/Advanced. He has been using BiPAP 13/10 but thinks he slept better with CPAP. We discussed options. Usually when he decompensates it is from fluid overload triggering CO2 retention and hypoventilation.  CXR 10/20/12 IMPRESSION:  Vascular congestion and cardiomegaly, with small bilateral pleural  effusions and bibasilar airspace opacification. Findings are most  compatible with recurrent pulmonary edema, though underlying  pneumonia cannot be excluded.  Original Report Authenticated By: Tonia Ghent,  M.D.   12/02/2012 Post Hospital follow up  Returns for a post hospital follow up .  Reports doing well overall but still having some SOB, thinks this may be due to his pressure not being high enough on BIPAP. Since decreasing BIPAP 1 month ago  down to 12/10 he does not feel as good. Trouble sleeping.   Was admitted for decompensated cor pulmonale w/ fluid overload with subsequent acute on chronic hypercarbic resp failure.  He admits he can not afford his advair and spiriva- therefore he is not taking.  He does not wear BIPAP everynight. We discussed the implications of this and untreated OHS/OSA. He naps a lot but does not wear BIPAP.  He was treated with diuresis with improvement.  Discharged on Lasix 40mg  . Lower dose due to bump in scr. Has OP follow up with Renal d/t renal insufficiency.    Review of Systems-see HPI Constitutional:   No- weight loss,  No-night sweats, fevers, chills, +fatigue, lassitude. HEENT:   No-  headaches, difficulty swallowing, tooth/dental problems, sore throat,       No-sneezing, itching, ear ache, nasal congestion, post nasal drip,  CV:   no-orthopnea, PND,  Much less-swelling in lower extremities, anasarca, dizziness, palpitations Resp: + shortness of breath with exertion or at rest.              No-   productive cough,  + non-productive cough,  No-  coughing up of blood.              No-   change in color of mucus.  No- wheezing.   Skin: No-   rash or lesions. GI:  No-   heartburn, indigestion, abdominal pain, nausea, vomiting,  GU:  MS:  No-   joint pain or swelling.  Neuro- nothing unusual Psych:  No- change in mood or affect. No depression or anxiety.  No memory loss. Objective:   Physical Exam GEN: A/Ox3; pleasant , NAD, obese   HEENT:  [place]/AT,  EACs-clear, TMs-wnl, NOSE-clear, THROAT-clear, no lesions, no postnasal drip or exudate noted.   NECK:  Supple w/ fair ROM; no JVD; normal carotid impulses w/o bruits; no thyromegaly or nodules palpated; no  lymphadenopathy.  RESP  Diminished BS in bases , no wheezing no accessory muscle use, no dullness to percussion  CARD:  RRR, 1/6 SM 1-2 + peripheral edema, pulses intact, no cyanosis or clubbing.  GI:   Soft & nt; nml bowel sounds; no organomegaly or masses detected.  Musco: Warm bil, no deformities or joint swelling noted.   Neuro: alert, no focal deficits noted.    Skin: Warm, no lesions or rashes

## 2012-12-02 NOTE — Addendum Note (Signed)
Addended by: Boone Master E on: 12/02/2012 02:31 PM   Modules accepted: Orders

## 2012-12-03 ENCOUNTER — Encounter (HOSPITAL_COMMUNITY)
Admission: RE | Admit: 2012-12-03 | Discharge: 2012-12-03 | Disposition: A | Discharge status: Home / Self Care | Payer: Medicare Other | Source: Ambulatory Visit | Attending: Nephrology | Admitting: Nephrology

## 2012-12-03 ENCOUNTER — Telehealth: Payer: Self-pay | Admitting: Adult Health

## 2012-12-03 DIAGNOSIS — N183 Chronic kidney disease, stage 3 unspecified: Secondary | ICD-10-CM | POA: Insufficient documentation

## 2012-12-03 DIAGNOSIS — D638 Anemia in other chronic diseases classified elsewhere: Secondary | ICD-10-CM | POA: Insufficient documentation

## 2012-12-03 LAB — RENAL FUNCTION PANEL
Albumin: 3.5 g/dL (ref 3.5–5.2)
Calcium: 9.4 mg/dL (ref 8.4–10.5)
GFR calc Af Amer: 45 mL/min — ABNORMAL LOW (ref >90)
GFR calc non Af Amer: 39 mL/min — ABNORMAL LOW (ref >90)
Glucose, Bld: 108 mg/dL — ABNORMAL HIGH (ref 70–99)
Phosphorus: 2.3 mg/dL (ref 2.3–4.6)
Sodium: 142 mEq/L (ref 135–145)

## 2012-12-03 LAB — IRON AND TIBC
Saturation Ratios: 15 % — ABNORMAL LOW (ref 20–55)
TIBC: 279 ug/dL (ref 215–435)
UIBC: 236 ug/dL (ref 125–400)

## 2012-12-03 MED ORDER — DARBEPOETIN ALFA-POLYSORBATE 60 MCG/0.3ML IJ SOLN
INTRAMUSCULAR | Status: AC
  Administered 2012-12-03: 60 ug via SUBCUTANEOUS
  Filled 2012-12-03: qty 0.3

## 2012-12-03 MED ORDER — DARBEPOETIN ALFA-POLYSORBATE 500 MCG/ML IJ SOLN
60.0000 ug | INTRAMUSCULAR | Status: DC
  Filled 2012-12-03: qty 1

## 2012-12-03 NOTE — Telephone Encounter (Signed)
I spoke with pt and he wanted to confirm how often he takes hi nebulizer medications: I advised him budesonide is 1 vial BID atovent 1 vial QID aoccrding to TP instructions And albuterol PRN every 4-6 HRS I advised him DO NOT MIX his ipratropium with his budesonide as he thought he was suppose to.  He voiced his understanding needed nothing further

## 2012-12-04 ENCOUNTER — Telehealth: Payer: Self-pay | Admitting: Internal Medicine

## 2012-12-04 NOTE — Progress Notes (Signed)
Quick Note:  LMOM TCB x1. ______ 

## 2012-12-04 NOTE — Telephone Encounter (Signed)
Result Note    Labs show renal fxn is no better    He was referred to renal at discharge ? Did they make ov    Needs follow up    No change in lasix dose.    MUST WEAR BIPAP W/ NAPS AND At bedtime    Please contact office for sooner follow up if symptoms do not improve or worsen or seek emergency care          Result Note    CONT TO RETAIN FLUID   CONT ON LASIX    MUST WEAR BIPAP W/ NAPS AND At bedtime    follow up Dr. Maple Hudson AS PLANNED AND As needed    Please contact office for sooner follow up if symptoms do not improve or worsen or seek emergency care    Result Note    Needs follow up with cardiology   [facility]

## 2012-12-05 NOTE — Progress Notes (Signed)
Quick Note:  Pt aware per 4.23.14 phone note. ______

## 2012-12-05 NOTE — Telephone Encounter (Signed)
ATC patient, no answer LMOMTCB 

## 2012-12-05 NOTE — Telephone Encounter (Signed)
Pt advised, he has an upcoming appt on 12-30-12 with renal doctor and he will call and schedule an appt with Dr. Allyson Sabal. Carron Curie, CMA

## 2012-12-05 NOTE — Progress Notes (Signed)
Quick Note:  ATC patient no answer. LMOM ______ 

## 2012-12-05 NOTE — Progress Notes (Signed)
Quick Note:  Pt aware per 4.23.14 phone note. ______ 

## 2012-12-05 NOTE — Progress Notes (Signed)
Quick Note:  ATC patient no answer. LMOM ______

## 2012-12-14 ENCOUNTER — Encounter: Payer: Self-pay | Admitting: Cardiovascular Disease

## 2012-12-18 ENCOUNTER — Other Ambulatory Visit (HOSPITAL_COMMUNITY): Payer: Self-pay | Admitting: *Deleted

## 2012-12-19 ENCOUNTER — Encounter (HOSPITAL_COMMUNITY): Payer: Medicare Other

## 2012-12-24 ENCOUNTER — Ambulatory Visit: Payer: Medicare Other | Admitting: Internal Medicine

## 2012-12-31 ENCOUNTER — Encounter (HOSPITAL_COMMUNITY): Payer: Medicare Other

## 2013-01-07 ENCOUNTER — Encounter (HOSPITAL_COMMUNITY)
Admission: RE | Admit: 2013-01-07 | Discharge: 2013-01-07 | Disposition: A | Discharge status: Home / Self Care | Payer: Medicare Other | Source: Ambulatory Visit | Attending: Nephrology | Admitting: Nephrology

## 2013-01-07 DIAGNOSIS — D638 Anemia in other chronic diseases classified elsewhere: Secondary | ICD-10-CM | POA: Insufficient documentation

## 2013-01-07 DIAGNOSIS — N183 Chronic kidney disease, stage 3 unspecified: Secondary | ICD-10-CM | POA: Insufficient documentation

## 2013-01-07 LAB — RENAL FUNCTION PANEL
Albumin: 3.4 g/dL — ABNORMAL LOW (ref 3.5–5.2)
BUN: 33 mg/dL — ABNORMAL HIGH (ref 6–23)
Calcium: 9.2 mg/dL (ref 8.4–10.5)
Chloride: 95 mEq/L — ABNORMAL LOW (ref 96–112)
Creatinine, Ser: 1.73 mg/dL — ABNORMAL HIGH (ref 0.50–1.35)

## 2013-01-07 MED ORDER — DARBEPOETIN ALFA-POLYSORBATE 60 MCG/0.3ML IJ SOLN
INTRAMUSCULAR | Status: AC
  Administered 2013-01-07: 60 ug via SUBCUTANEOUS
  Filled 2013-01-07: qty 0.3

## 2013-01-07 MED ORDER — DARBEPOETIN ALFA-POLYSORBATE 500 MCG/ML IJ SOLN
60.0000 ug | INTRAMUSCULAR | Status: DC
  Filled 2013-01-07: qty 1

## 2013-01-07 MED ORDER — SODIUM CHLORIDE 0.9 % IV SOLN
1020.0000 mg | Freq: Once | INTRAVENOUS | Status: AC
  Administered 2013-01-07: 1020 mg via INTRAVENOUS
  Filled 2013-01-07: qty 34

## 2013-01-20 ENCOUNTER — Other Ambulatory Visit: Payer: Self-pay

## 2013-01-20 MED ORDER — NITROGLYCERIN 0.4 MG SL SUBL: 0.4000 mg | SUBLINGUAL_TABLET | SUBLINGUAL | Status: DC | PRN

## 2013-01-20 NOTE — Telephone Encounter (Signed)
Rx was sent to pharmacy electronically. 

## 2013-01-22 ENCOUNTER — Telehealth: Payer: Self-pay | Admitting: Cardiovascular Disease

## 2013-01-22 MED ORDER — FUROSEMIDE 40 MG PO TABS: 40.0000 mg | ORAL_TABLET | Freq: Every day | ORAL | Status: DC

## 2013-01-22 NOTE — Telephone Encounter (Signed)
Been waiting for his medicine for  3 days-just talked to the pharmacist-was told to call here-he needs his fluid medicine-Please call to [facility]-[phone]!

## 2013-01-22 NOTE — Telephone Encounter (Signed)
Furosemide refilled to pharmacy.

## 2013-01-27 ENCOUNTER — Inpatient Hospital Stay (HOSPITAL_COMMUNITY)
Admission: EM | Admit: 2013-01-27 | Discharge: 2013-01-30 | DRG: 189 | Disposition: A | Discharge status: Home / Self Care | Payer: Medicare Other | Attending: Pulmonary Disease | Admitting: Pulmonary Disease

## 2013-01-27 ENCOUNTER — Encounter (HOSPITAL_COMMUNITY): Payer: Self-pay | Admitting: Emergency Medicine

## 2013-01-27 ENCOUNTER — Other Ambulatory Visit: Payer: Self-pay

## 2013-01-27 ENCOUNTER — Emergency Department (HOSPITAL_COMMUNITY): Payer: Medicare Other

## 2013-01-27 DIAGNOSIS — J9601 Acute respiratory failure with hypoxia: Secondary | ICD-10-CM

## 2013-01-27 DIAGNOSIS — Z9181 History of falling: Secondary | ICD-10-CM

## 2013-01-27 DIAGNOSIS — I10 Essential (primary) hypertension: Secondary | ICD-10-CM

## 2013-01-27 DIAGNOSIS — N189 Chronic kidney disease, unspecified: Secondary | ICD-10-CM

## 2013-01-27 DIAGNOSIS — D649 Anemia, unspecified: Secondary | ICD-10-CM

## 2013-01-27 DIAGNOSIS — R4182 Altered mental status, unspecified: Secondary | ICD-10-CM

## 2013-01-27 DIAGNOSIS — I129 Hypertensive chronic kidney disease with stage 1 through stage 4 chronic kidney disease, or unspecified chronic kidney disease: Secondary | ICD-10-CM | POA: Diagnosis present

## 2013-01-27 DIAGNOSIS — D696 Thrombocytopenia, unspecified: Secondary | ICD-10-CM | POA: Diagnosis present

## 2013-01-27 DIAGNOSIS — Z9981 Dependence on supplemental oxygen: Secondary | ICD-10-CM

## 2013-01-27 DIAGNOSIS — R0789 Other chest pain: Secondary | ICD-10-CM

## 2013-01-27 DIAGNOSIS — Z87891 Personal history of nicotine dependence: Secondary | ICD-10-CM

## 2013-01-27 DIAGNOSIS — I509 Heart failure, unspecified: Secondary | ICD-10-CM | POA: Diagnosis present

## 2013-01-27 DIAGNOSIS — G4733 Obstructive sleep apnea (adult) (pediatric): Secondary | ICD-10-CM | POA: Diagnosis present

## 2013-01-27 DIAGNOSIS — D638 Anemia in other chronic diseases classified elsewhere: Secondary | ICD-10-CM | POA: Diagnosis present

## 2013-01-27 DIAGNOSIS — J9620 Acute and chronic respiratory failure, unspecified whether with hypoxia or hypercapnia: Secondary | ICD-10-CM

## 2013-01-27 DIAGNOSIS — E119 Type 2 diabetes mellitus without complications: Secondary | ICD-10-CM | POA: Diagnosis present

## 2013-01-27 DIAGNOSIS — I2609 Other pulmonary embolism with acute cor pulmonale: Secondary | ICD-10-CM | POA: Diagnosis present

## 2013-01-27 DIAGNOSIS — N183 Chronic kidney disease, stage 3 unspecified: Secondary | ICD-10-CM | POA: Diagnosis present

## 2013-01-27 DIAGNOSIS — T783XXA Angioneurotic edema, initial encounter: Secondary | ICD-10-CM

## 2013-01-27 DIAGNOSIS — N179 Acute kidney failure, unspecified: Secondary | ICD-10-CM | POA: Diagnosis present

## 2013-01-27 DIAGNOSIS — Z6841 Body Mass Index (BMI) 40.0 and over, adult: Secondary | ICD-10-CM

## 2013-01-27 DIAGNOSIS — Z9861 Coronary angioplasty status: Secondary | ICD-10-CM

## 2013-01-27 DIAGNOSIS — F411 Generalized anxiety disorder: Secondary | ICD-10-CM | POA: Diagnosis present

## 2013-01-27 DIAGNOSIS — E662 Morbid (severe) obesity with alveolar hypoventilation: Secondary | ICD-10-CM | POA: Diagnosis present

## 2013-01-27 DIAGNOSIS — E872 Acidosis, unspecified: Secondary | ICD-10-CM | POA: Diagnosis present

## 2013-01-27 DIAGNOSIS — E873 Alkalosis: Secondary | ICD-10-CM

## 2013-01-27 DIAGNOSIS — G9340 Encephalopathy, unspecified: Secondary | ICD-10-CM | POA: Diagnosis present

## 2013-01-27 DIAGNOSIS — N400 Enlarged prostate without lower urinary tract symptoms: Secondary | ICD-10-CM | POA: Diagnosis present

## 2013-01-27 DIAGNOSIS — J9610 Chronic respiratory failure, unspecified whether with hypoxia or hypercapnia: Secondary | ICD-10-CM

## 2013-01-27 DIAGNOSIS — I519 Heart disease, unspecified: Secondary | ICD-10-CM

## 2013-01-27 DIAGNOSIS — E785 Hyperlipidemia, unspecified: Secondary | ICD-10-CM | POA: Diagnosis present

## 2013-01-27 DIAGNOSIS — J4489 Other specified chronic obstructive pulmonary disease: Secondary | ICD-10-CM | POA: Diagnosis present

## 2013-01-27 DIAGNOSIS — R0689 Other abnormalities of breathing: Secondary | ICD-10-CM

## 2013-01-27 DIAGNOSIS — J441 Chronic obstructive pulmonary disease with (acute) exacerbation: Secondary | ICD-10-CM

## 2013-01-27 DIAGNOSIS — E678 Other specified hyperalimentation: Secondary | ICD-10-CM

## 2013-01-27 DIAGNOSIS — I2510 Atherosclerotic heart disease of native coronary artery without angina pectoris: Secondary | ICD-10-CM

## 2013-01-27 DIAGNOSIS — I5033 Acute on chronic diastolic (congestive) heart failure: Secondary | ICD-10-CM | POA: Diagnosis present

## 2013-01-27 DIAGNOSIS — Z9119 Patient's noncompliance with other medical treatment and regimen: Secondary | ICD-10-CM

## 2013-01-27 DIAGNOSIS — Z91199 Patient's noncompliance with other medical treatment and regimen due to unspecified reason: Secondary | ICD-10-CM

## 2013-01-27 DIAGNOSIS — J449 Chronic obstructive pulmonary disease, unspecified: Secondary | ICD-10-CM

## 2013-01-27 DIAGNOSIS — K219 Gastro-esophageal reflux disease without esophagitis: Secondary | ICD-10-CM | POA: Diagnosis present

## 2013-01-27 DIAGNOSIS — M109 Gout, unspecified: Secondary | ICD-10-CM | POA: Diagnosis present

## 2013-01-27 DIAGNOSIS — W010XXA Fall on same level from slipping, tripping and stumbling without subsequent striking against object, initial encounter: Secondary | ICD-10-CM | POA: Diagnosis present

## 2013-01-27 DIAGNOSIS — Z951 Presence of aortocoronary bypass graft: Secondary | ICD-10-CM

## 2013-01-27 DIAGNOSIS — I739 Peripheral vascular disease, unspecified: Secondary | ICD-10-CM | POA: Diagnosis present

## 2013-01-27 DIAGNOSIS — M129 Arthropathy, unspecified: Secondary | ICD-10-CM | POA: Diagnosis present

## 2013-01-27 DIAGNOSIS — E875 Hyperkalemia: Secondary | ICD-10-CM | POA: Diagnosis present

## 2013-01-27 DIAGNOSIS — J9600 Acute respiratory failure, unspecified whether with hypoxia or hypercapnia: Secondary | ICD-10-CM

## 2013-01-27 LAB — CBC
HCT: 34.5 % — ABNORMAL LOW (ref 39.0–52.0)
Hemoglobin: 8.9 g/dL — ABNORMAL LOW (ref 13.0–17.0)
MCH: 26.9 pg (ref 26.0–34.0)
MCHC: 27.3 g/dL — ABNORMAL LOW (ref 30.0–36.0)
MCV: 98.9 fL (ref 78.0–100.0)
RBC: 3.49 MIL/uL — ABNORMAL LOW (ref 4.22–5.81)
RBC: 3.27 MIL/uL — ABNORMAL LOW (ref 4.22–5.81)
RDW: 14.5 % (ref 11.5–15.5)
WBC: 4.7 10*3/uL (ref 4.0–10.5)
WBC: 4.3 10*3/uL (ref 4.0–10.5)

## 2013-01-27 LAB — GLUCOSE, CAPILLARY: Glucose-Capillary: 113 mg/dL — ABNORMAL HIGH (ref 70–99)

## 2013-01-27 LAB — BASIC METABOLIC PANEL
BUN: 39 mg/dL — ABNORMAL HIGH (ref 6–23)
CO2: >45 mEq/L (ref 19–32)
Chloride: 94 mEq/L — ABNORMAL LOW (ref 96–112)
Creatinine, Ser: 1.82 mg/dL — ABNORMAL HIGH (ref 0.50–1.35)

## 2013-01-27 LAB — CREATININE, SERUM
Creatinine, Ser: 1.76 mg/dL — ABNORMAL HIGH (ref 0.50–1.35)
GFR calc Af Amer: 47 mL/min — ABNORMAL LOW (ref >90)
GFR calc non Af Amer: 40 mL/min — ABNORMAL LOW (ref >90)

## 2013-01-27 LAB — MRSA PCR SCREENING: MRSA by PCR: NEGATIVE

## 2013-01-27 MED ORDER — NITROGLYCERIN 0.4 MG SL SUBL: 0.4000 mg | SUBLINGUAL_TABLET | SUBLINGUAL | Status: DC | PRN

## 2013-01-27 MED ORDER — ALBUTEROL SULFATE (5 MG/ML) 0.5% IN NEBU
5.0000 mg | INHALATION_SOLUTION | Freq: Once | RESPIRATORY_TRACT | Status: AC
  Administered 2013-01-27: 5 mg via RESPIRATORY_TRACT
  Filled 2013-01-27: qty 1

## 2013-01-27 MED ORDER — TAMSULOSIN HCL 0.4 MG PO CAPS: 0.4000 mg | ORAL_CAPSULE | Freq: Every day | ORAL | Status: DC

## 2013-01-27 MED ORDER — CLOPIDOGREL BISULFATE 75 MG PO TABS
75.0000 mg | ORAL_TABLET | Freq: Every day | ORAL | Status: DC
  Administered 2013-01-27 – 2013-01-30 (×4): 75 mg via ORAL
  Filled 2013-01-27 (×4): qty 1

## 2013-01-27 MED ORDER — ALBUTEROL SULFATE (5 MG/ML) 0.5% IN NEBU
2.5000 mg | INHALATION_SOLUTION | Freq: Four times a day (QID) | RESPIRATORY_TRACT | Status: DC
  Administered 2013-01-27 – 2013-01-28 (×2): 2.5 mg via RESPIRATORY_TRACT
  Filled 2013-01-27 (×2): qty 0.5

## 2013-01-27 MED ORDER — ALBUTEROL SULFATE (5 MG/ML) 0.5% IN NEBU: 2.5000 mg | INHALATION_SOLUTION | RESPIRATORY_TRACT | Status: DC | PRN

## 2013-01-27 MED ORDER — SODIUM CHLORIDE 0.9 % IV SOLN: INTRAVENOUS | Status: DC

## 2013-01-27 MED ORDER — IPRATROPIUM BROMIDE 0.02 % IN SOLN
0.5000 mg | Freq: Four times a day (QID) | RESPIRATORY_TRACT | Status: DC
  Administered 2013-01-27 – 2013-01-28 (×2): 0.5 mg via RESPIRATORY_TRACT
  Filled 2013-01-27 (×2): qty 2.5

## 2013-01-27 MED ORDER — ADULT MULTIVITAMIN W/MINERALS CH: 1.0000 | ORAL_TABLET | Freq: Every day | ORAL | Status: DC

## 2013-01-27 MED ORDER — HEPARIN SODIUM (PORCINE) 5000 UNIT/ML IJ SOLN
5000.0000 [IU] | Freq: Three times a day (TID) | INTRAMUSCULAR | Status: DC
  Administered 2013-01-27 – 2013-01-30 (×9): 5000 [IU] via SUBCUTANEOUS
  Filled 2013-01-27 (×11): qty 1

## 2013-01-27 MED ORDER — IPRATROPIUM BROMIDE 0.02 % IN SOLN
0.5000 mg | Freq: Once | RESPIRATORY_TRACT | Status: AC
  Administered 2013-01-27: 0.5 mg via RESPIRATORY_TRACT
  Filled 2013-01-27: qty 2.5

## 2013-01-27 MED ORDER — ASPIRIN 81 MG PO CHEW: 324.0000 mg | CHEWABLE_TABLET | ORAL | Status: DC

## 2013-01-27 MED ORDER — METOPROLOL TARTRATE 1 MG/ML IV SOLN: 2.5000 mg | INTRAVENOUS | Status: DC | PRN

## 2013-01-27 MED ORDER — METHYLPREDNISOLONE SODIUM SUCC 40 MG IJ SOLR
40.0000 mg | Freq: Two times a day (BID) | INTRAMUSCULAR | Status: DC
  Administered 2013-01-27 – 2013-01-28 (×2): 40 mg via INTRAVENOUS
  Filled 2013-01-27 (×4): qty 1

## 2013-01-27 MED ORDER — BIOTENE DRY MOUTH MT LIQD
15.0000 mL | Freq: Two times a day (BID) | OROMUCOSAL | Status: DC
  Administered 2013-01-28 – 2013-01-30 (×5): 15 mL via OROMUCOSAL

## 2013-01-27 MED ORDER — ASPIRIN 325 MG PO TABS
325.0000 mg | ORAL_TABLET | ORAL | Status: AC
  Administered 2013-01-27: 325 mg via ORAL
  Filled 2013-01-27: qty 1

## 2013-01-27 MED ORDER — ISOSORBIDE MONONITRATE ER 60 MG PO TB24
60.0000 mg | ORAL_TABLET | Freq: Every day | ORAL | Status: DC
  Administered 2013-01-27 – 2013-01-30 (×4): 60 mg via ORAL
  Filled 2013-01-27 (×4): qty 1

## 2013-01-27 MED ORDER — FUROSEMIDE 10 MG/ML IJ SOLN: 40.0000 mg | Freq: Two times a day (BID) | INTRAMUSCULAR | Status: DC

## 2013-01-27 MED ORDER — METOPROLOL TARTRATE 25 MG PO TABS: 12.5000 mg | ORAL_TABLET | Freq: Two times a day (BID) | ORAL | Status: DC

## 2013-01-27 MED ORDER — BUDESONIDE 0.5 MG/2ML IN SUSP
0.5000 mg | Freq: Two times a day (BID) | RESPIRATORY_TRACT | Status: DC
  Administered 2013-01-27 – 2013-01-30 (×6): 0.5 mg via RESPIRATORY_TRACT
  Filled 2013-01-27 (×8): qty 2

## 2013-01-27 MED ORDER — INSULIN ASPART 100 UNIT/ML ~~LOC~~ SOLN
0.0000 [IU] | SUBCUTANEOUS | Status: DC
  Administered 2013-01-27 – 2013-01-28 (×4): 2 [IU] via SUBCUTANEOUS

## 2013-01-27 MED ORDER — FENTANYL CITRATE 0.05 MG/ML IJ SOLN
INTRAMUSCULAR | Status: AC
  Filled 2013-01-27: qty 4

## 2013-01-27 MED ORDER — MIDAZOLAM HCL 2 MG/2ML IJ SOLN
INTRAMUSCULAR | Status: AC
  Filled 2013-01-27: qty 4

## 2013-01-27 MED ORDER — BUDESONIDE 0.25 MG/2ML IN SUSP: 0.2500 mg | Freq: Two times a day (BID) | RESPIRATORY_TRACT | Status: DC

## 2013-01-27 MED ORDER — PANTOPRAZOLE SODIUM 40 MG PO TBEC: 40.0000 mg | DELAYED_RELEASE_TABLET | Freq: Every day | ORAL | Status: DC

## 2013-01-27 MED ORDER — PANTOPRAZOLE SODIUM 40 MG IV SOLR
40.0000 mg | INTRAVENOUS | Status: DC
  Administered 2013-01-27: 40 mg via INTRAVENOUS
  Filled 2013-01-27 (×2): qty 40

## 2013-01-27 MED ORDER — ASPIRIN 300 MG RE SUPP: 300.0000 mg | RECTAL | Status: DC

## 2013-01-27 MED ORDER — BUDESONIDE 0.25 MG/2ML IN SUSP
0.5000 mg | Freq: Two times a day (BID) | RESPIRATORY_TRACT | Status: DC
  Filled 2013-01-27: qty 4

## 2013-01-27 MED ORDER — SODIUM CHLORIDE 0.9 % IV SOLN: 250.0000 mL | INTRAVENOUS | Status: DC | PRN

## 2013-01-27 MED ORDER — ATORVASTATIN CALCIUM 40 MG PO TABS: 40.0000 mg | ORAL_TABLET | Freq: Every day | ORAL | Status: DC

## 2013-01-27 MED ORDER — CHLORHEXIDINE GLUCONATE 0.12 % MT SOLN
15.0000 mL | Freq: Two times a day (BID) | OROMUCOSAL | Status: DC
  Administered 2013-01-27 – 2013-01-30 (×6): 15 mL via OROMUCOSAL
  Filled 2013-01-27 (×8): qty 15

## 2013-01-27 MED ORDER — METHYLPREDNISOLONE SODIUM SUCC 125 MG IJ SOLR
125.0000 mg | Freq: Once | INTRAMUSCULAR | Status: AC
  Administered 2013-01-27: 125 mg via INTRAVENOUS
  Filled 2013-01-27: qty 2

## 2013-01-27 MED ORDER — ASPIRIN EC 81 MG PO TBEC
81.0000 mg | DELAYED_RELEASE_TABLET | Freq: Every day | ORAL | Status: DC
  Administered 2013-01-27 – 2013-01-30 (×4): 81 mg via ORAL
  Filled 2013-01-27 (×4): qty 1

## 2013-01-27 MED ORDER — FUROSEMIDE 10 MG/ML IJ SOLN
40.0000 mg | Freq: Three times a day (TID) | INTRAMUSCULAR | Status: DC
  Administered 2013-01-27 – 2013-01-29 (×6): 40 mg via INTRAVENOUS
  Filled 2013-01-27 (×8): qty 4

## 2013-01-27 NOTE — Progress Notes (Signed)
Unit CM UR Completed by [facility] ED CM  W. Reed Dady RN  

## 2013-01-27 NOTE — H&P (Signed)
PULMONARY  / CRITICAL CARE MEDICINE  Name: [name] [name] MRN: [id number] DOB: 1951/06/04    ADMISSION DATE:  01/27/2013 CONSULTATION DATE:  01/27/13  REFERRING MD :  EDP PRIMARY SERVICE: PCCM  CHIEF COMPLAINT:  Shortness of Breath    BRIEF PATIENT DESCRIPTION: 62 y.o. Minimally compliant obese black male with a history of CAD, COPD, OSA, CHF, HTN, CKD, and Type II DM presents to the [facility] ED with shortness of breath and fall at home.   SIGNIFICANT EVENTS / STUDIES:  3/10 - ECHO  LV 55-60% ...................................................... 6/16 - Hypercarbia on Bipap   LINES / TUBES:  CULTURES:  ANTIBIOTICS:   HISTORY OF PRESENT ILLNESS:  Pleasant 62 y.o. Obese black male presents to the ED after falling at home several hours ago.  Patient states that he has been having falls at home for the last several months.  Patient states that he has associated fatigue, shortness of breath, leg swelling, feeling foggy, and changes in his vision (cant watch TV the same as usual, blurry vision).  Patient states that he has not been sleeping well at night.  He relates it to his CPAP machine being uncomfortable.  He also states that he takes 2-3 naps during the day that last 1-2 hrs without wearing his CPAP.  Patient also admits to missing several doses of his lasix.  According to office notes he has been out of lasix since at least 6/8.  He states that his legs have been swelling, smaller in the past.  Patient denies cough, anginal chest pain, pleuritic chest pain, new onset leg pain, and unilateral leg redness or swelling.  Patients past medical history is significant for CAD (CABG, Stent), COPD, OSA, CHF, HTN, CKD, and Type II DM.  Patient was admitted to the hospital 11/14/12-11/18/12 for a similar chief complaint.  Surgical history had coronary stent ([id number],[id number], 2011), and CABG x 6 (2001), decortication post CABG.  Patient admits to smoking.  Patient has a  25 pack year history of smoking.   Last seen in our office 12/02/12    PAST MEDICAL HISTORY :  Past Medical History  Diagnosis Date  . Heart disease, unspecified   . Coronary atherosclerosis of unspecified type of vessel, native or graft   . Chronic pulmonary heart disease, unspecified   . Other hyperalimentation   . Chronic respiratory failure   . Unspecified sleep apnea   . Chronic airway obstruction, not elsewhere classified   . Angina   . Shortness of breath   . Anemia   . Chronic kidney disease   . Anxiety   . Hypertension   . CHF (congestive heart failure)   . Blood transfusion     no reaction from transfusion  . Arthritis     hx of gout  . Diabetes mellitus   . CORONARY ARTERY DISEASE 10/28/2007  . CPAP (continuous positive airway pressure) dependence   . GERD (gastroesophageal reflux disease)    Past Surgical History  Procedure Laterality Date  . Coronary artery bypass graft    . Appendectomy    . Bilateral vats ablation    . Coronary angioplasty with stent placement    . Hemmorhoids    . Lung sx  2005    Growth on outside of right lung  . Esophagogastroduodenoscopy  08/11/2011    Procedure: ESOPHAGOGASTRODUODENOSCOPY (EGD);  Surgeon: Barrie Folk, MD;  Location: [facility] ENDOSCOPY;  Service: Endoscopy;  Laterality: N/A;  . Colonoscopy  08/11/2011    Procedure: COLONOSCOPY;  Surgeon: Barrie Folk, MD;  Location: [facility] ENDOSCOPY;  Service: Endoscopy;  Laterality: N/A;   Prior to Admission medications   Medication Sig Start Date End Date Taking? Authorizing Provider  albuterol (PROVENTIL HFA;VENTOLIN HFA) 108 (90 BASE) MCG/ACT inhaler Inhale 2 puffs into the lungs every 6 (six) hours as needed. For wheezing   Yes Historical Provider, MD  albuterol (PROVENTIL) (2.5 MG/3ML) 0.083% nebulizer solution Take 3 mLs (2.5 mg total) by nebulization every 6 (six) hours as needed for wheezing or shortness of breath. 03/28/12 03/28/13 Yes Waymon Budge, MD  aspirin EC 81 MG tablet Take 81 mg by mouth daily.   Yes  Historical Provider, MD  budesonide (PULMICORT) 0.25 MG/2ML nebulizer solution Take 2 mLs (0.25 mg total) by nebulization 2 (two) times daily. Dx: 496 12/02/12  Yes Tammy S Parrett, NP  clopidogrel (PLAVIX) 75 MG tablet Take 75 mg by mouth daily.   Yes Historical Provider, MD  COLCRYS 0.6 MG tablet Take 1 tablet by mouth daily. 01/02/11  Yes Historical Provider, MD  furosemide (LASIX) 40 MG tablet Take 80 mg by mouth daily. May take an additional 40 mg if he has additional edema.   Yes Historical Provider, MD  ipratropium (ATROVENT) 0.02 % nebulizer solution Take 2.5 mLs (500 mcg total) by nebulization 4 (four) times daily. Dx: 496 12/02/12  Yes Tammy S Parrett, NP  isosorbide mononitrate (IMDUR) 30 MG 24 hr tablet Take 60 mg by mouth daily.    Yes Historical Provider, MD  metoprolol tartrate (LOPRESSOR) 25 MG tablet Take 12.5 mg by mouth 2 (two) times daily. 07/22/11  Yes Luke K Kilroy, PA-C  Multiple Vitamin (MULITIVITAMIN WITH MINERALS) TABS Take 1 tablet by mouth daily.   Yes Historical Provider, MD  nitroGLYCERIN (NITROSTAT) 0.4 MG SL tablet Place 1 tablet (0.4 mg total) under the tongue every 5 (five) minutes as needed. For chest pain 01/20/13  Yes Runell Gess, MD  pantoprazole (PROTONIX) 40 MG tablet Take 1 tablet by mouth daily. 12/18/10  Yes Historical Provider, MD  rosuvastatin (CRESTOR) 20 MG tablet Take 20 mg by mouth daily.    Yes Historical Provider, MD  Tamsulosin HCl (FLOMAX) 0.4 MG CAPS Take 1 tablet by mouth daily. 12/16/10  Yes Historical Provider, MD  BAYER CONTOUR TEST test strip  01/24/12   Historical Provider, MD   Allergies  Allergen Reactions  . Amlodipine Besy-Benazepril Hcl Swelling    Lips swell  . Percocet (Oxycodone-Acetaminophen) Other (See Comments)    hallucinations    FAMILY HISTORY:  Family History  Problem Relation Age of Onset  . Diabetes Sister    SOCIAL HISTORY:  reports that he quit smoking about 14 years ago. His smoking use included Cigarettes. He has  a 25 pack-year smoking history. He has never used smokeless tobacco. He reports that  drinks alcohol. He reports that he does not use illicit drugs.  REVIEW OF SYSTEMS:   All other review of systems negative General:  Fatigue HEENT:  Vision changes Pulmonary:  Shortness of breath, orthopnea.  Denies cough, pleuritic chest pain Cardiac:  Leg swelling, Denies anginal chest pain, palpitations Neuro:  Dizziness, syncope, loss of balance  SUBJECTIVE:   VITAL SIGNS: Temp:  [98.2 F (36.8 C)-98.5 F (36.9 C)] 98.2 F (36.8 C) (06/16 1246) Pulse Rate:  [83-98] 95 (06/16 1530) Resp:  [18-24] 22 (06/16 1530) BP: (111-141)/(46-86) 132/75 mmHg (06/16 1530) SpO2:  [98 %-100 %] 98 % (06/16 1530) FiO2 (%):  [40 %] 40 % (06/16  1441)  HEMODYNAMICS:    VENTILATOR SETTINGS: Vent Mode:  [-]  FiO2 (%):  [40 %] 40 %  INTAKE / OUTPUT: Intake/Output   None     PHYSICAL EXAMINATION: General:  Obese black male, somnolent Neuro:  A&Ox4, CN II-XII focally intact, no asterixis, appropriate mentation, able to follow commands HEENT:  PERLA, opacities bilaterally.   Cardiovascular:  Irregular rate, tachy, soft murmur, no rubs or gallops, 3+ pretibial edema, with chronic venous stasis changes to lower legs. Lungs:  On Bipap, tenderness to palpation over the left precordium.  Distant breath sounds, diminished over bases.  No overt wheezing, crackles, rhonchi   Abdomen:  Obese, +BS, soft, non-tender to palpation Musculoskeletal:  No gross deformities.  Appropriate motor strength and ROM in all 4 extremities Skin:  Chronic venous stasis changes BLE, small abrasion of left knee  LABS:  Recent Labs Lab 01/27/13 1210 01/27/13 1424  HGB 9.4*  --   WBC 4.7  --   PLT 97*  --   NA 143  --   K 4.6  --   CL 94*  --   CO2 >45*  --   GLUCOSE 133*  --   BUN 39*  --   CREATININE 1.82*  --   CALCIUM 9.4  --   PROBNP 216.2*  --   PHART  --  7.133*  PCO2ART  --  PENDING  PO2ART  --  75.9*   No results  found for this basename: GLUCAP,  in the last 168 hours  CXR: increased lung markings consistent with edema, ? Infiltrate vs. Atelectasis in left lower lobe  ASSESSMENT / PLAN:  PULMONARY A: -Acute on chronic hypercarbic respiratory failure - no obvious bspasm or infection as triggering factor, likely related to worsening cor pulmonale from missing lasix & bipap non compliance -COPD. Without acute exacerbation.  -OHS -OSA  P:   -BiPap 18 over 10 -higher IPAP to ventialte better(home setting 13/10), consider intubation if change in mental status -repeat abgs after 2-4 hr on Bipap -budesonide, atrovent, albuterol -1x dose solumedrol in ED 3 pm -mandatory CPAP at home -hold benzos -Repeat CXR tomorrow am   CARDIOVASCULAR A: -Diastolic CHF / Cor Pulmonale - decompensation with missed lasix dosing -HTN -PVD P:  -kvo -lasix 40 q 12 -Cont imdur -hold atorvastatin -lopressor IV prn for HR >120, systolic pressure >170, consider restart home PO dosing in am 6/17 -continue plavix   RENAL A:   -CKD -BPH P:   -Follow Bmet for Scr -consider restart flomax 6/17  GASTROINTESTINAL A:  GERD P:   -protonix for GI prophylaxis  HEMATOLOGIC A:   -Anemia P:  -follow cbcs -Heparin Subq for DVT prophylaxis  INFECTIOUS A:  NO evidence of active infection P:   Monitor fever curve and WBCs  ENDOCRINE A:  -Type II DM P:   -SSI  NEUROLOGIC A:   -acute encephalopathy secondary to hypercarbia P:   -monitor neuro status closely  TODAY'S SUMMARY: 62 y.o. Minimally compliant obese black male with a history of CAD, COPD, OSA, CHF, HTN, CKD, and Type II DM presents to the [facility] ED with shortness of breath and falls at home.  Likely acute on chronic hypercarbic respiratory failure due to a combination of obesity hypoventilation syndrome, medical non-compliance, decompensated cor pulmonale and benzodiazepine use.  Will admit patient to the ICU and obsereve closely for need of  intubation for acute on chronic respiratory failure.  Consider home health nurse after discharge to help with compliance  of medications.     Carley Hammed Physicians Assistant Student 01/27/2013, 4:27 PM  [name], NP-C [facility] Pgr: [phone] or [phone]  Care during the described time interval was provided by me and/or other providers on the critical care team.  I have reviewed this patient's available data, including medical history, events of note, physical examination and test results as part of my evaluation  CC time x 52m  Diondra Pines V.

## 2013-01-27 NOTE — Progress Notes (Signed)
eLink Physician-Brief Progress Note Patient Name: [name] [name] DOB: [birth year] MRN: [id number]  Date of Service  01/27/2013   HPI/Events of Note  Worsening resp status  eICU Interventions  abg unchnaged,  Having pccm eval for ett   Intervention Category Major Interventions: Acid-Base disturbance - evaluation and management  Nelda Bucks. 01/27/2013, 10:05 PM

## 2013-01-27 NOTE — ED Provider Notes (Signed)
History     CSN: [id number]  Arrival date & time 01/27/13  1135   First MD Initiated Contact with Patient 01/27/13 1246      Chief Complaint  Patient presents with  . Chest Pain  . Loss of Consciousness    (Consider location/radiation/quality/duration/timing/severity/associated sxs/prior treatment) HPI Comments: Patient with extensive pmh presents for evaluation after a fall.  His medical problems include CAD/CABG, COPD, CHF, DM and he is on home oxygen 3LNC.  His reports that he has been more somnolent for the past several days.  This morning he woke up and was sitting on the side of the bed.  His wife noticed that he was falling asleep while sitting up.  She encourage him to lie back down, but he fell over injuring his left knee and left anterior chest wall.  He denies worsening shortness of breath.  No cough but does complain of chest discomfort where the contusion is located.    The history is provided by the patient.    Past Medical History  Diagnosis Date  . Heart disease, unspecified   . Coronary atherosclerosis of unspecified type of vessel, native or graft   . Chronic pulmonary heart disease, unspecified   . Other hyperalimentation   . Chronic respiratory failure   . Unspecified sleep apnea   . Chronic airway obstruction, not elsewhere classified   . Angina   . Shortness of breath   . Anemia   . Chronic kidney disease   . Anxiety   . Hypertension   . CHF (congestive heart failure)   . Blood transfusion     no reaction from transfusion  . Arthritis     hx of gout  . Diabetes mellitus   . CORONARY ARTERY DISEASE 10/28/2007  . CPAP (continuous positive airway pressure) dependence   . GERD (gastroesophageal reflux disease)     Past Surgical History  Procedure Laterality Date  . Coronary artery bypass graft    . Appendectomy    . Bilateral vats ablation    . Coronary angioplasty with stent placement    . Hemmorhoids    . Lung sx  2005    Growth on outside of  right lung  . Esophagogastroduodenoscopy  08/11/2011    Procedure: ESOPHAGOGASTRODUODENOSCOPY (EGD);  Surgeon: Barrie Folk, MD;  Location: [facility] ENDOSCOPY;  Service: Endoscopy;  Laterality: N/A;  . Colonoscopy  08/11/2011    Procedure: COLONOSCOPY;  Surgeon: Barrie Folk, MD;  Location: [facility] ENDOSCOPY;  Service: Endoscopy;  Laterality: N/A;    Family History  Problem Relation Age of Onset  . Diabetes Sister     History  Substance Use Topics  . Smoking status: Former Smoker -- 1.50 packs/day for 10 years    Types: Cigarettes    Quit date: 08/14/1998  . Smokeless tobacco: Never Used  . Alcohol Use: Yes     Comment: social      Review of Systems  Constitutional: Positive for fatigue. Negative for fever.  Cardiovascular: Positive for chest pain.  Musculoskeletal:       Knee pain.   All other systems reviewed and are negative.    Allergies  Amlodipine besy-benazepril hcl and Percocet  Home Medications   Current Outpatient Rx  Name  Route  Sig  Dispense  Refill  . albuterol (PROVENTIL HFA;VENTOLIN HFA) 108 (90 BASE) MCG/ACT inhaler   Inhalation   Inhale 2 puffs into the lungs every 6 (six) hours as needed. For wheezing         .  albuterol (PROVENTIL) (2.5 MG/3ML) 0.083% nebulizer solution   Nebulization   Take 3 mLs (2.5 mg total) by nebulization every 6 (six) hours as needed for wheezing or shortness of breath.   75 mL   12   . aspirin EC 81 MG tablet   Oral   Take 81 mg by mouth daily.         . budesonide (PULMICORT) 0.25 MG/2ML nebulizer solution   Nebulization   Take 2 mLs (0.25 mg total) by nebulization 2 (two) times daily. Dx: 496   120 mL   5   . clopidogrel (PLAVIX) 75 MG tablet   Oral   Take 75 mg by mouth daily.         Marland Kitchen COLCRYS 0.6 MG tablet   Oral   Take 1 tablet by mouth daily.         . furosemide (LASIX) 40 MG tablet   Oral   Take 80 mg by mouth daily. May take an additional 40 mg if he has additional edema.         Marland Kitchen  ipratropium (ATROVENT) 0.02 % nebulizer solution   Nebulization   Take 2.5 mLs (500 mcg total) by nebulization 4 (four) times daily. Dx: 496   300 mL   5   . isosorbide mononitrate (IMDUR) 30 MG 24 hr tablet   Oral   Take 60 mg by mouth daily.          . metoprolol tartrate (LOPRESSOR) 25 MG tablet   Oral   Take 12.5 mg by mouth 2 (two) times daily.         . Multiple Vitamin (MULITIVITAMIN WITH MINERALS) TABS   Oral   Take 1 tablet by mouth daily.         . nitroGLYCERIN (NITROSTAT) 0.4 MG SL tablet   Sublingual   Place 1 tablet (0.4 mg total) under the tongue every 5 (five) minutes as needed. For chest pain   25 tablet   6   . pantoprazole (PROTONIX) 40 MG tablet   Oral   Take 1 tablet by mouth daily.         . rosuvastatin (CRESTOR) 20 MG tablet   Oral   Take 20 mg by mouth daily.          . Tamsulosin HCl (FLOMAX) 0.4 MG CAPS   Oral   Take 1 tablet by mouth daily.         Marland Kitchen BAYER CONTOUR TEST test strip                 BP 136/68  Pulse 96  Temp(Src) 98.2 F (36.8 C) (Oral)  Resp 22  SpO2 99%  Physical Exam  Nursing note and vitals reviewed. Constitutional: He appears well-developed and well-nourished. No distress.  HENT:  Head: Normocephalic and atraumatic.  Mouth/Throat: Oropharynx is clear and moist.  Neck: Normal range of motion. Neck supple.  Cardiovascular: Normal rate, regular rhythm and normal heart sounds.   No murmur heard. Pulmonary/Chest: Effort normal and breath sounds normal.  There is ttp, and a small contusion over the left anterior lower ribs.  There is no abdominal ttp.  Abdominal: Soft. Bowel sounds are normal. He exhibits no distension. There is no tenderness.  Lymphadenopathy:    He has no cervical adenopathy.  Skin: He is not diaphoretic.    ED Course  Procedures (including critical care time)  Labs Reviewed  CBC - Abnormal; Notable for the following:    RBC  3.49 (*)    Hemoglobin 9.4 (*)    HCT 34.5 (*)     MCHC 27.2 (*)    Platelets 97 (*)    All other components within normal limits  BASIC METABOLIC PANEL - Abnormal; Notable for the following:    Chloride 94 (*)    CO2 >45 (*)    Glucose, Bld 133 (*)    BUN 39 (*)    Creatinine, Ser 1.82 (*)    GFR calc non Af Amer 39 (*)    GFR calc Af Amer 45 (*)    All other components within normal limits  PRO B NATRIURETIC PEPTIDE - Abnormal; Notable for the following:    Pro B Natriuretic peptide (BNP) 216.2 (*)    All other components within normal limits  BLOOD GAS, ARTERIAL  POCT I-STAT TROPONIN I   No results found.   No diagnosis found.   Date: 01/27/2013  Rate: 82  Rhythm: normal sinus rhythm  QRS Axis: normal  Intervals: normal  ST/T Wave abnormalities: nonspecific T wave changes  Conduction Disutrbances:right bundle branch block  Narrative Interpretation:   Old EKG Reviewed: unchanged    MDM  The patient presents with weakness, somnolence, and after having fallen this morning.  The workup reveals a CO2>45, for which I ordered an ABG.  The results show a pH of 7.135, PaO2 90.3, HCO3 46.4, and BE of 17.  The  PCO2 >130.  It appears as though his symptoms are the result of CO2 narcosis.  I have ordered bipap, steroids, and a duoneb treatment.  I have also consulted PCCM who will see the patient in the ED.   CRITICAL CARE Performed by: Geoffery Lyons Total critical care time: 45 minutes Critical care time was exclusive of separately billable procedures and treating other patients. Critical care was necessary to treat or prevent imminent or life-threatening deterioration. Critical care was time spent personally by me on the following activities: development of treatment plan with patient and/or surrogate as well as nursing, discussions with consultants, evaluation of patient's response to treatment, examination of patient, obtaining history from patient or surrogate, ordering and performing treatments and interventions, ordering  and review of laboratory studies, ordering and review of radiographic studies, pulse oximetry and re-evaluation of patient's condition.          Geoffery Lyons, MD 01/27/13 [phone]

## 2013-01-27 NOTE — Progress Notes (Signed)
ABG reviewed with RT - ph 7.133 / O2 75 / HCO3 47 / BE17.6   [name], NP-C [facility] Pgr: [phone] or [phone]

## 2013-01-27 NOTE — ED Notes (Signed)
Pt reports fall at this AM while trying to stand up. Pt does not recall fall. Now c/o left knee pain. Pt also c/o left sided chest pain that began in the car on the way to the hospital. Pt denies radiation of pain. Reports increased SOb. Pt currently wearing home 02 at 3L.

## 2013-01-27 NOTE — ED Notes (Signed)
Report attempted x 1

## 2013-01-28 ENCOUNTER — Inpatient Hospital Stay (HOSPITAL_COMMUNITY): Payer: Medicare Other

## 2013-01-28 DIAGNOSIS — J9620 Acute and chronic respiratory failure, unspecified whether with hypoxia or hypercapnia: Principal | ICD-10-CM

## 2013-01-28 LAB — CBC
Hemoglobin: 9 g/dL — ABNORMAL LOW (ref 13.0–17.0)
MCH: 27 pg (ref 26.0–34.0)
MCV: 99.1 fL (ref 78.0–100.0)
Platelets: 100 10*3/uL — ABNORMAL LOW (ref 150–400)
RBC: 3.33 MIL/uL — ABNORMAL LOW (ref 4.22–5.81)
WBC: 3.6 10*3/uL — ABNORMAL LOW (ref 4.0–10.5)

## 2013-01-28 LAB — GLUCOSE, CAPILLARY
Glucose-Capillary: 142 mg/dL — ABNORMAL HIGH (ref 70–99)
Glucose-Capillary: 151 mg/dL — ABNORMAL HIGH (ref 70–99)

## 2013-01-28 LAB — BASIC METABOLIC PANEL
CO2: 44 mEq/L (ref 19–32)
Calcium: 9.2 mg/dL (ref 8.4–10.5)
Chloride: 91 mEq/L — ABNORMAL LOW (ref 96–112)
Glucose, Bld: 153 mg/dL — ABNORMAL HIGH (ref 70–99)
Sodium: 139 mEq/L (ref 135–145)

## 2013-01-28 LAB — HEMOGLOBIN A1C
Hgb A1c MFr Bld: 5.9 % — ABNORMAL HIGH (ref <5.7)
Mean Plasma Glucose: 123 mg/dL — ABNORMAL HIGH (ref <117)

## 2013-01-28 MED ORDER — METOPROLOL TARTRATE 12.5 MG HALF TABLET
12.5000 mg | ORAL_TABLET | Freq: Two times a day (BID) | ORAL | Status: DC
  Administered 2013-01-28 – 2013-01-30 (×5): 12.5 mg via ORAL
  Filled 2013-01-28 (×6): qty 1

## 2013-01-28 MED ORDER — PANTOPRAZOLE SODIUM 40 MG PO TBEC
40.0000 mg | DELAYED_RELEASE_TABLET | Freq: Every day | ORAL | Status: DC
  Administered 2013-01-28 – 2013-01-30 (×3): 40 mg via ORAL
  Filled 2013-01-28 (×3): qty 1

## 2013-01-28 MED ORDER — INSULIN ASPART 100 UNIT/ML ~~LOC~~ SOLN
0.0000 [IU] | Freq: Three times a day (TID) | SUBCUTANEOUS | Status: DC
  Administered 2013-01-28: 5 [IU] via SUBCUTANEOUS
  Administered 2013-01-28: 3 [IU] via SUBCUTANEOUS
  Administered 2013-01-29: 2 [IU] via SUBCUTANEOUS
  Administered 2013-01-29: 3 [IU] via SUBCUTANEOUS
  Administered 2013-01-29 – 2013-01-30 (×2): 5 [IU] via SUBCUTANEOUS

## 2013-01-28 MED ORDER — TIOTROPIUM BROMIDE MONOHYDRATE 18 MCG IN CAPS
18.0000 ug | ORAL_CAPSULE | Freq: Every day | RESPIRATORY_TRACT | Status: DC
  Administered 2013-01-29 – 2013-01-30 (×2): 18 ug via RESPIRATORY_TRACT
  Filled 2013-01-28 (×2): qty 5

## 2013-01-28 MED ORDER — TAMSULOSIN HCL 0.4 MG PO CAPS
0.4000 mg | ORAL_CAPSULE | Freq: Every day | ORAL | Status: DC
  Administered 2013-01-28 – 2013-01-30 (×3): 0.4 mg via ORAL
  Filled 2013-01-28 (×3): qty 1

## 2013-01-28 MED ORDER — ARFORMOTEROL TARTRATE 15 MCG/2ML IN NEBU
15.0000 ug | INHALATION_SOLUTION | Freq: Two times a day (BID) | RESPIRATORY_TRACT | Status: DC
  Administered 2013-01-28 – 2013-01-30 (×4): 15 ug via RESPIRATORY_TRACT
  Filled 2013-01-28 (×8): qty 2

## 2013-01-28 MED ORDER — SODIUM POLYSTYRENE SULFONATE 15 GM/60ML PO SUSP
30.0000 g | Freq: Once | ORAL | Status: AC
  Administered 2013-01-28: 30 g
  Filled 2013-01-28: qty 120

## 2013-01-28 MED ORDER — PREDNISONE 20 MG PO TABS
20.0000 mg | ORAL_TABLET | Freq: Every day | ORAL | Status: DC
  Administered 2013-01-28 – 2013-01-30 (×3): 20 mg via ORAL
  Filled 2013-01-28 (×4): qty 1

## 2013-01-28 MED ORDER — ATORVASTATIN CALCIUM 40 MG PO TABS
40.0000 mg | ORAL_TABLET | Freq: Every day | ORAL | Status: DC
  Administered 2013-01-28 – 2013-01-29 (×2): 40 mg via ORAL
  Filled 2013-01-28 (×3): qty 1

## 2013-01-28 NOTE — Progress Notes (Signed)
PULMONARY  / CRITICAL CARE MEDICINE  Name: MARKHAM DUMLAO MRN: [id number] DOB: 1951/04/23    ADMISSION DATE:  01/27/2013 CONSULTATION DATE:  01/27/13  REFERRING MD :  EDP PRIMARY SERVICE: PCCM  CHIEF COMPLAINT:  Shortness of Breath    BRIEF PATIENT DESCRIPTION:  62 yo male presented to ED after fall at home associated with lethargy and dyspnea.  He was found to have acute cor pulmonale with acute on chronic hypoxic/hypercapnic respiratory failure with hx of OSA/OHS, and COPD.  He has been non-compliant with therapy.  PCCM asked to admit to ICU.  He is followed by Dr. Maple Hudson in pulmonary office.  SIGNIFICANT EVENTS: 6/16 Admit to ICU, started on BiPAP 6/17 Transfer to SDU  STUDIES:  10/21/12 Echo >> mild LVH, EF 55 to 60%, grade 1 diastolic dysfx, mild AS, mild LA dilation  SUBJECTIVE:  Breathing better.  Denies chest pain.    VITAL SIGNS: Temp:  [97.6 F (36.4 C)-98.7 F (37.1 C)] 98.7 F (37.1 C) (06/17 0751) Pulse Rate:  [77-111] 77 (06/17 0700) Resp:  [3-52] 19 (06/17 0700) BP: (97-153)/(41-96) 153/96 mmHg (06/17 0700) SpO2:  [98 %-100 %] 100 % (06/17 0700) FiO2 (%):  [40 %] 40 % (06/17 0400) Weight:  [356 lb 4.2 oz (161.6 kg)] 356 lb 4.2 oz (161.6 kg) (06/16 1700) BiPAP with 40% FiO2  INTAKE / OUTPUT: Intake/Output     06/16 0701 - 06/17 0700 06/17 0701 - 06/18 0700   I.V. (mL/kg) 140 (0.9)    Total Intake(mL/kg) 140 (0.9)    Urine (mL/kg/hr) 300 625 (3)   Total Output 300 625   Net -160 -625        Urine Occurrence 4 x      PHYSICAL EXAMINATION: General: No distress Neuro: Alert, normal strength, follows commands HEENT: No sinus tenderness Cardiovascular:  Regular, no murmur Lungs: decreased breath sounds, no wheeze, faint rales at bases Abdomen: soft, non tender Musculoskeletal: 1+ edema Skin: chronic venous stasis  LABS:  Recent Labs Lab 01/27/13 1210 01/27/13 1424 01/27/13 1625 01/27/13 2146 01/28/13 0315  HGB 9.4*  --  8.9*  --  9.0*  WBC  4.7  --  4.3  --  3.6*  PLT 97*  --  93*  --  100*  NA 143  --   --   --  139  K 4.6  --   --   --  5.3*  CL 94*  --   --   --  91*  CO2 >45*  --   --   --  44*  GLUCOSE 133*  --   --   --  153*  BUN 39*  --   --   --  48*  CREATININE 1.82*  --  1.76*  --  1.84*  CALCIUM 9.4  --   --   --  9.2  PROBNP 216.2*  --   --   --   --   PHART  --  7.133*  --  7.159*  --   PCO2ART  --  PENDING  --  PENDING  --   PO2ART  --  75.9*  --  PENDING  --     Recent Labs Lab 01/27/13 1646 01/27/13 2027 01/28/13 0047 01/28/13 0346 01/28/13 0726  GLUCAP 113* 130* 142* 138* 142*    Imaging: Dg Chest 2 View  01/27/2013   *RADIOLOGY REPORT*  Clinical Data: Chest pain.  Loss of consciousness.  Knee swelling.  CHEST - 2 VIEW  Comparison: 12/02/2012.  Findings: Stable enlarged cardiac silhouette and post CABG changes. No significant change in diffuse prominence of the pulmonary vasculature and interstitial markings.  No significant change in mild bilateral pleural thickening or fluid.  Posterior basilar opacity is unchanged.  Thoracic spine degenerative changes.  IMPRESSION:  1.  Stable cardiomegaly, pulmonary vascular congestion, chronic bronchitic changes and chronic interstitial lung disease. 2.  No significant change in bibasilar pleural thickening or fluid and posterior basilar atelectasis or scarring.   Original Report Authenticated By: Beckie Salts, M.D.   Dg Chest Port 1 View  01/28/2013   *RADIOLOGY REPORT*  Clinical Data: Shortness of breath.  PORTABLE CHEST - 1 VIEW  Comparison: 01/27/2013.  Findings: The heart is enlarged but stable.  Persistent vascular congestion and  interstitial edema along with bilateral pleural effusions and bibasilar atelectasis.  Slight improved perihilar aeration.  IMPRESSION: Persistent CHF with slight improved perihilar aeration.   Original Report Authenticated By: Rudie Meyer, M.D.   Dg Knee Complete 4 Views Left  01/27/2013   *RADIOLOGY REPORT*  Clinical Data: Left  knee swelling and pain following a fall.  LEFT KNEE - COMPLETE 4+ VIEW  Comparison: 12/10/2010.  Findings: Diffuse soft tissue swelling.  Vascular clips.  Anterior and posterior patellar spur formation.  Atheromatous arterial calcifications.  Degenerative changes and fusion at the articulation of the fibular head and tibia.  No fracture, dislocation or effusion seen.  IMPRESSION:  1.  No fracture. 2.  Degenerative changes, as described above.   Original Report Authenticated By: Beckie Salts, M.D.     ASSESSMENT / PLAN:  PULMONARY A: Acute on chronic hypoxic/hypercapnic respiratory failure with acute pulmonary edema from OSA/OHS, COPD, and acute on chronic cor pulmonale. P:   -BiPAP qhs and prn during day -goal negative fluid balance -limit ABG's -continue pulmicort -add brovana and spiriva -change albuterol to prn -d/c atrovent while on spiriva -f/u CXR -change solumedrol to prednisone 20 mg daily and wean off over next few days  CARDIOVASCULAR A: Acute on chronic diastolic heart failure. Hx of HTN, CAD, PVD, hyperlipidemia. P:  -continue asa, imdur, plavix -continue lasix 40 mg q8h IV -resume lopressor 12.5 mg bid -resume crestor 20 mg qdaily >> use lipitor while in hospital  RENAL A: Hx of CKD, BPH. Hyperkalemia >> likely related to acidosis; received kayexalate 6/17. P:   -monitor renal fx, urine outpt, electrolytes -resume flomax 0.4 mg qdaily  GASTROINTESTINAL A: Nutrition. Hx of GERD. P:   -carb modified diet -continue protonix  HEMATOLOGIC A:  Anemia of chronic disease. Thrombocytopenia. P:  -f/u CBC -transfuse for Hb < 7 -SQ heparin for DVT prevention  INFECTIOUS A:  No evidence of infection P:   -monitor clinically  ENDOCRINE A: Reported hx of DM >> no meds for this listed. P:   -SSI -check HbA1C  NEUROLOGIC A:  Acute encephalopathy 2nd to respiratory failure >> much improved 6/17. Frequent falls at home. P:   -Monitor clinically -PT/OT  assessment  Summary: Respiratory status improved.  Will advance diet, and mobilize.  Transfer to SDU.  Keep on PCCM service.  Coralyn Helling, MD [facility] Pulmonary/Critical Care 01/28/2013, 8:34 AM Pager:  [phone] After 3pm call: [phone]

## 2013-01-28 NOTE — Progress Notes (Signed)
eLink Physician Progress Note and Electrolyte Replacement  Patient Name: [name] [name] DOB: 1950-10-14 MRN: [id number]  Date of Service  01/28/2013   HPI/Events of Note    Recent Labs Lab 01/27/13 1210 01/27/13 1625 01/28/13 0315  NA 143  --  139  K 4.6  --  5.3*  CL 94*  --  91*  CO2 >45*  --  44*  GLUCOSE 133*  --  153*  BUN 39*  --  48*  CREATININE 1.82* 1.76* 1.84*  CALCIUM 9.4  --  9.2    Estimated Creatinine Clearance: 68 ml/min (by C-G formula based on Cr of 1.84).  Intake/Output     06/16 0701 - 06/17 0700   I.V. (mL/kg) 110 (0.7)   Total Intake(mL/kg) 110 (0.7)   Net +110       Urine Occurrence 2 x    - I/O DETAILED x 24h    Total I/O In: 90 [I.V.:90] Out: -  - I/O THIS SHIFT    ASSESSMENT Mild hyperkalemia  eICURN Interventions  Kayexalate 30gm via oral   ASSESSMENT: MAJOR ELECTROLYTE      Dr. Kalman Shan, M.D., [facility].C.P Pulmonary and Critical Care Medicine Staff Physician [facility] [place] [facility] Pager: [phone], If no answer or between  15:00h - 7:00h: call [phone]  01/28/2013 4:50 AM

## 2013-01-28 NOTE — Progress Notes (Signed)
Pt does not have bipap in his room at this time.  Pt on 3lpm [place] at 100% saturation.  Rt will decrease o2 to 2lpm after tx given.

## 2013-01-29 ENCOUNTER — Inpatient Hospital Stay (HOSPITAL_COMMUNITY): Payer: Medicare Other

## 2013-01-29 DIAGNOSIS — G4733 Obstructive sleep apnea (adult) (pediatric): Secondary | ICD-10-CM

## 2013-01-29 LAB — CBC
MCH: 27.1 pg (ref 26.0–34.0)
MCHC: 27.6 g/dL — ABNORMAL LOW (ref 30.0–36.0)
MCV: 98.4 fL (ref 78.0–100.0)
Platelets: 107 10*3/uL — ABNORMAL LOW (ref 150–400)
RBC: 3.06 MIL/uL — ABNORMAL LOW (ref 4.22–5.81)
RDW: 14.8 % (ref 11.5–15.5)

## 2013-01-29 LAB — BLOOD GAS, ARTERIAL
Acid-Base Excess: 14.2 mmol/L — ABNORMAL HIGH (ref 0.0–2.0)
Bicarbonate: 43.1 mEq/L — ABNORMAL HIGH (ref 20.0–24.0)
Delivery systems: POSITIVE
Expiratory PAP: 5
FIO2: 0.4 %
Inspiratory PAP: 18
O2 Saturation: 92.2 %
Patient temperature: 98.6
RATE: 16 resp/min
TCO2: 46.9 mmol/L (ref 0–100)
pCO2 arterial: 0 mmHg — CL (ref 35.0–45.0)
pH, Arterial: 7.133 — CL (ref 7.350–7.450)
pH, Arterial: 7.159 — CL (ref 7.350–7.450)
pO2, Arterial: 75.9 mmHg — ABNORMAL LOW (ref 80.0–100.0)

## 2013-01-29 LAB — BASIC METABOLIC PANEL
BUN: 61 mg/dL — ABNORMAL HIGH (ref 6–23)
Calcium: 9 mg/dL (ref 8.4–10.5)
Creatinine, Ser: 2.13 mg/dL — ABNORMAL HIGH (ref 0.50–1.35)
GFR calc non Af Amer: 32 mL/min — ABNORMAL LOW (ref >90)
Glucose, Bld: 107 mg/dL — ABNORMAL HIGH (ref 70–99)
Sodium: 143 mEq/L (ref 135–145)

## 2013-01-29 LAB — GLUCOSE, CAPILLARY
Glucose-Capillary: 169 mg/dL — ABNORMAL HIGH (ref 70–99)
Glucose-Capillary: 205 mg/dL — ABNORMAL HIGH (ref 70–99)
Glucose-Capillary: 139 mg/dL — ABNORMAL HIGH (ref 70–99)

## 2013-01-29 MED ORDER — FUROSEMIDE 10 MG/ML IJ SOLN
40.0000 mg | Freq: Every day | INTRAMUSCULAR | Status: DC
  Administered 2013-01-30: 40 mg via INTRAVENOUS
  Filled 2013-01-29: qty 4

## 2013-01-29 NOTE — Progress Notes (Signed)
PULMONARY  / CRITICAL CARE MEDICINE  Name: [name] [name] MRN: [id number] DOB: June 03, 1951    ADMISSION DATE:  01/27/2013 CONSULTATION DATE:  01/27/13  REFERRING MD :  EDP PRIMARY SERVICE: PCCM  CHIEF COMPLAINT:  Shortness of Breath    BRIEF PATIENT DESCRIPTION:  62 yo male presented to ED after fall at home associated with lethargy and dyspnea.  He was found to have acute cor pulmonale with acute on chronic hypoxic/hypercapnic respiratory failure with hx of OSA/OHS, and COPD.  He has been non-compliant with therapy.  PCCM asked to admit to ICU.  He is followed by Dr. Maple Hudson in pulmonary office.  SIGNIFICANT EVENTS: 6/16 Admit to ICU, started on BiPAP 6/17 Transfer to SDU  STUDIES:  10/21/12 Echo >> mild LVH, EF 55 to 60%, grade 1 diastolic dysfx, mild AS, mild LA dilation  SUBJECTIVE:  Used bipap overnight & this am while sleeping in chair   Denies chest pain.    VITAL SIGNS: Temp:  [97.7 F (36.5 C)-98.7 F (37.1 C)] 98.7 F (37.1 C) (06/18 1100) Pulse Rate:  [64-92] 67 (06/18 1100) Resp:  [13-26] 14 (06/18 1100) BP: (89-127)/(43-67) 118/60 mmHg (06/18 1100) SpO2:  [100 %] 100 % (06/18 1202) FiO2 (%):  [40 %] 40 % (06/18 0741) BiPAP with 40% FiO2  INTAKE / OUTPUT: Intake/Output     06/17 0701 - 06/18 0700 06/18 0701 - 06/19 0700   P.O. 360 120   I.V. (mL/kg) 10 (0.1)    Total Intake(mL/kg) 370 (2.3) 120 (0.7)   Urine (mL/kg/hr) 2500 (0.6) 600 (0.5)   Total Output 2500 600   Net -2130 -480          PHYSICAL EXAMINATION: General: No distress Neuro: Alert, normal strength, follows commands HEENT: No sinus tenderness Cardiovascular:  Regular, no murmur Lungs: decreased breath sounds, no wheeze, faint rales at bases Abdomen: soft, non tender Musculoskeletal: 1+ edema -improved Skin: chronic venous stasis  LABS:  Recent Labs Lab 01/27/13 1210 01/27/13 1424 01/27/13 1625 01/27/13 2146 01/28/13 0315 01/29/13 0535  HGB 9.4*  --  8.9*  --  9.0* 8.3*  WBC  4.7  --  4.3  --  3.6* 5.8  PLT 97*  --  93*  --  100* 107*  NA 143  --   --   --  139 143  K 4.6  --   --   --  5.3* 4.3  CL 94*  --   --   --  91* 92*  CO2 >45*  --   --   --  44* >45*  GLUCOSE 133*  --   --   --  153* 107*  BUN 39*  --   --   --  48* 61*  CREATININE 1.82*  --  1.76*  --  1.84* 2.13*  CALCIUM 9.4  --   --   --  9.2 9.0  PROBNP 216.2*  --   --   --   --   --   PHART  --  7.133*  --  7.159*  --   --   PCO2ART  --  0.0*  --  0.0*  --   --   PO2ART  --  75.9*  --  0.0*  --   --     Recent Labs Lab 01/28/13 1156 01/28/13 1650 01/28/13 2205 01/29/13 0750 01/29/13 1250  GLUCAP 216* 151* 192* 123* 169*    Imaging: Dg Chest 2 View  01/27/2013   *RADIOLOGY REPORT*  Clinical Data: Chest pain.  Loss of consciousness.  Knee swelling.  CHEST - 2 VIEW  Comparison: 12/02/2012.  Findings: Stable enlarged cardiac silhouette and post CABG changes. No significant change in diffuse prominence of the pulmonary vasculature and interstitial markings.  No significant change in mild bilateral pleural thickening or fluid.  Posterior basilar opacity is unchanged.  Thoracic spine degenerative changes.  IMPRESSION:  1.  Stable cardiomegaly, pulmonary vascular congestion, chronic bronchitic changes and chronic interstitial lung disease. 2.  No significant change in bibasilar pleural thickening or fluid and posterior basilar atelectasis or scarring.   Original Report Authenticated By: Beckie Salts, M.D.   Dg Chest Port 1 View  01/29/2013   *RADIOLOGY REPORT*  Clinical Data: Shortness of breath and follow-up CHF.  PORTABLE CHEST - 1 VIEW  Comparison: 01/28/2013  Findings: Stable enlargement of the cardiac silhouette.  Slightly improved aeration of the lungs may represent decreased edema. Persistent left basilar densities.  Negative for a pneumothorax.  IMPRESSION: Slightly improved aeration in the lungs may represent decreasing edema.   Original Report Authenticated By: Richarda Overlie, M.D.   Dg Chest  Port 1 View  01/28/2013   *RADIOLOGY REPORT*  Clinical Data: Shortness of breath.  PORTABLE CHEST - 1 VIEW  Comparison: 01/27/2013.  Findings: The heart is enlarged but stable.  Persistent vascular congestion and  interstitial edema along with bilateral pleural effusions and bibasilar atelectasis.  Slight improved perihilar aeration.  IMPRESSION: Persistent CHF with slight improved perihilar aeration.   Original Report Authenticated By: Rudie Meyer, M.D.   Dg Knee Complete 4 Views Left  01/27/2013   *RADIOLOGY REPORT*  Clinical Data: Left knee swelling and pain following a fall.  LEFT KNEE - COMPLETE 4+ VIEW  Comparison: 12/10/2010.  Findings: Diffuse soft tissue swelling.  Vascular clips.  Anterior and posterior patellar spur formation.  Atheromatous arterial calcifications.  Degenerative changes and fusion at the articulation of the fibular head and tibia.  No fracture, dislocation or effusion seen.  IMPRESSION:  1.  No fracture. 2.  Degenerative changes, as described above.   Original Report Authenticated By: Beckie Salts, M.D.     ASSESSMENT / PLAN:  PULMONARY A: Acute on chronic hypoxic/hypercapnic respiratory failure with acute pulmonary edema from OSA/OHS, COPD, and acute on chronic cor pulmonale. P:   -BiPAP qhs and prn during day -goal negative fluid balance -limit ABG's -continue pulmicort -add brovana and spiriva -change albuterol to prn -d/c atrovent while on spiriva -f/u CXR - prednisone 20 mg daily and wean off over next few days  CARDIOVASCULAR A: Acute on chronic diastolic heart failure. Hx of HTN, CAD, PVD, hyperlipidemia. P:  -continue asa, imdur, plavix  -resume lopressor 12.5 mg bid -resume crestor 20 mg qdaily >> use lipitor while in hospital  RENAL A:AKI - on lasix Hx of CKD, BPH. Hyperkalemia >> likely related to acidosis; received kayexalate 6/17. P:  Back off onlasix to once daily due to rising cr- -resume flomax 0.4 mg qdaily  GASTROINTESTINAL A:  Nutrition. Hx of GERD. P:   -carb modified diet -continue protonix  HEMATOLOGIC A:  Anemia of chronic disease. Thrombocytopenia. P:  -f/u CBC -transfuse for Hb < 7 -SQ heparin for DVT prevention  INFECTIOUS A:  No evidence of infection P:   -monitor clinically  ENDOCRINE A: Reported hx of DM >> no meds for this listed. P:   -SSI -check HbA1C  NEUROLOGIC A:  Acute encephalopathy 2nd to respiratory failure >> much improved 6/17. Frequent falls at home. P:   -  Monitor clinically -PT/OT assessment  Summary: Respiratory status improved.Diuresis limited by rising cr. Emphasized bipap,. Will see if he qualifies for pulmonary rehab but clearly his medical status needs to improve.  Cyril Mourning MD. Tonny Bollman. [place] [facility] Pager [phone] If no response call [phone]    01/29/2013, 1:47 PM

## 2013-01-29 NOTE — Evaluation (Signed)
Physical Therapy Evaluation Patient Details Name: [name] [name] MRN: [id number] DOB: 1950-11-21 Today's Date: 01/29/2013 Time: 4696-2952 PT Time Calculation (min): 21 min  PT Assessment / Plan / Recommendation Clinical Impression  Pt is a 62 yo male with COPD. Pt's physical activity is significantly limited by cardiopulmonary status and weight. Pt requires minG for safety with transfers and supervision for ambulation with RW. Pt would benefit from acute Pt to improve mobility status and transfer safety to reduce future falls risk. Pt would benefit from cardio/pulmonary rehab to improve activity tolerance. Pt will require 24/7 supervision for ADLs for safe d/c home to reduce future falls risk    PT Assessment  Patient needs continued PT services    Follow Up Recommendations  Supervision/Assistance - 24 hour (cardiopulmonary rehab)    Does the patient have the potential to tolerate intense rehabilitation      Barriers to Discharge        Equipment Recommendations  Rolling walker with 5" wheels    Recommendations for Other Services  (Cardiopulmonary rehab)   Frequency Min 3X/week    Precautions / Restrictions Precautions Precautions: Fall Precaution Comments: pt reports multiple previous falls. He was unable to get up off the floor independently. Wife and daughter came to assist Restrictions Weight Bearing Restrictions: No   Pertinent Vitals/Pain 0/10      Mobility  Bed Mobility Bed Mobility: Not assessed (pt recieved in chair) Transfers Transfers: Sit to Stand;Stand to Sit Sit to Stand: 4: Min guard;From chair/3-in-1;With upper extremity assist Stand to Sit: 4: Min guard;To chair/3-in-1;With upper extremity assist Details for Transfer Assistance: Pt with minG for balance Ambulation/Gait Ambulation/Gait Assistance: 5: Supervision Ambulation Distance (Feet): 125 Feet Assistive device: Rolling walker Ambulation/Gait Assistance Details: Supervision for safety, RR  increased to 31, SpO2 98% on 3 L O2 via [place] Gait Pattern: Within Functional Limits Gait velocity: decreased General Gait Details: Pt requesting two standing rest breaks    Exercises     PT Diagnosis: Difficulty walking  PT Problem List: Decreased activity tolerance;Decreased mobility;Cardiopulmonary status limiting activity PT Treatment Interventions: DME instruction;Gait training;Functional mobility training;Therapeutic activities;Therapeutic exercise   PT Goals Acute Rehab PT Goals PT Goal Formulation: With patient Time For Goal Achievement: 02/12/13 Potential to Achieve Goals: Good Pt will go Supine/Side to Sit: with modified independence;with HOB 0 degrees PT Goal: Supine/Side to Sit - Progress: Goal set today Pt will go Sit to Stand: with modified independence;with upper extremity assist PT Goal: Sit to Stand - Progress: Goal set today Pt will Ambulate: >150 feet;with modified independence;with least restrictive assistive device PT Goal: Ambulate - Progress: Goal set today Pt will Perform Home Exercise Program: Independently PT Goal: Perform Home Exercise Program - Progress: Goal set today  Visit Information  Last PT Received On: 01/29/13 Assistance Needed: +1    Subjective Data  Subjective: Pt received sitting up in chair agreeable to PT.   Prior Functioning  Home Living Lives With: Spouse Available Help at Discharge: Family;Available PRN/intermittently (wife works during the day but can provide 24/7) Type of Home: House Home Access: Level entry Home Layout: One level Bathroom Shower/Tub: Engineer, manufacturing systems: Handicapped height Bathroom Accessibility: Yes How Accessible: Accessible via walker Home Adaptive Equipment: Hand-held shower hose;Grab bars around toilet;Grab bars in shower;Shower chair with back;Straight cane;Walker - rolling Prior Function Level of Independence: Independent Driving: Yes Vocation: Retired Musician: No  difficulties Dominant Hand: Right    Cognition  Cognition Arousal/Alertness: Awake/alert Behavior During Therapy: WFL for tasks assessed/performed Overall  Cognitive Status: Within Functional Limits for tasks assessed    Extremity/Trunk Assessment Right Upper Extremity Assessment RUE ROM/Strength/Tone: Within functional levels Left Upper Extremity Assessment LUE ROM/Strength/Tone: Within functional levels Right Lower Extremity Assessment RLE ROM/Strength/Tone: Within functional levels Left Lower Extremity Assessment LLE ROM/Strength/Tone: Within functional levels Trunk Assessment Trunk Assessment: Normal   Balance Balance Balance Assessed: Yes Static Standing Balance Static Standing - Balance Support: No upper extremity supported Static Standing - Level of Assistance: 5: Stand by assistance Static Standing - Comment/# of Minutes: 2 min  End of Session PT - End of Session Equipment Utilized During Treatment: Gait belt;Oxygen (3L O2 via [place]) Activity Tolerance: Patient limited by fatigue Patient left: in chair;with call bell/phone within reach;with family/visitor present Nurse Communication: Mobility status  GP     01/29/2013, 12:07 PM Marvis Moeller, Student Physical Therapist Office #: [phone]

## 2013-01-29 NOTE — Progress Notes (Signed)
Pt requested to continue wearing BiPAP at this time. No complications noted. RT Will monitor.

## 2013-01-29 NOTE — Evaluation (Signed)
Physical Therapy Evaluation Patient Details Name: [name] [name] MRN: [id number] DOB: [birth year] Today's Date: 01/29/2013 Time: 1478-2956 PT Time Calculation (min): 21 min  PT Assessment / Plan / Recommendation Clinical Impression  Pt is a 62 yo male with COPD. Pt's physical activity is significantly limited by cardiopulmonary status and weight. Pt requires minG for safety with transfers and supervision for ambulation with RW. Pt would benefit from acute PT to improve mobility status and transfer safety to reduce future falls risk. Post d/c from hospital pt would benefit from cardio/pulmonary rehab to improve activity tolerance. Pt will require 24/7 supervision for ADLs for safe d/c home to reduce future falls risk    PT Assessment  Patient needs continued PT services    Follow Up Recommendations  Supervision/Assistance - 24 hour (cardiopulmonary rehab) Gearldine Bienenstock, NP and [facility], case management notified.    Does the patient have the potential to tolerate intense rehabilitation      Barriers to Discharge        Equipment Recommendations  Rolling walker with 5" wheels    Recommendations for Other Services  (Cardiopulmonary rehab)   Frequency Min 3X/week    Precautions / Restrictions Precautions Precautions: Fall Precaution Comments: pt reports multiple previous falls. He was unable to get up off the floor independently. Wife and daughter came to assist Restrictions Weight Bearing Restrictions: No   Pertinent Vitals/Pain 0/10      Mobility  Bed Mobility Bed Mobility: Not assessed (pt recieved in chair) Transfers Transfers: Sit to Stand;Stand to Sit Sit to Stand: 4: Min guard;From chair/3-in-1;With upper extremity assist Stand to Sit: 4: Min guard;To chair/3-in-1;With upper extremity assist Details for Transfer Assistance: Pt with minG for balance Ambulation/Gait Ambulation/Gait Assistance: 5: Supervision Ambulation Distance (Feet): 125 Feet Assistive device: Rolling  walker Ambulation/Gait Assistance Details: Supervision for safety, RR increased to 31, SpO2 98% on 3 L O2 via [place] Gait Pattern: Within Functional Limits Gait velocity: decreased General Gait Details: Pt requesting two standing rest breaks    Exercises     PT Diagnosis: Difficulty walking  PT Problem List: Decreased activity tolerance;Decreased mobility;Cardiopulmonary status limiting activity PT Treatment Interventions: DME instruction;Gait training;Functional mobility training;Therapeutic activities;Therapeutic exercise   PT Goals Acute Rehab PT Goals PT Goal Formulation: With patient Time For Goal Achievement: 02/12/13 Potential to Achieve Goals: Good Pt will go Supine/Side to Sit: with modified independence;with HOB 0 degrees PT Goal: Supine/Side to Sit - Progress: Goal set today Pt will go Sit to Stand: with modified independence;with upper extremity assist PT Goal: Sit to Stand - Progress: Goal set today Pt will Ambulate: >150 feet;with modified independence;with least restrictive assistive device PT Goal: Ambulate - Progress: Goal set today Pt will Perform Home Exercise Program: Independently PT Goal: Perform Home Exercise Program - Progress: Goal set today  Visit Information  Last PT Received On: 01/29/13 Assistance Needed: +1    Subjective Data  Subjective: Pt received sitting up in chair agreeable to PT.   Prior Functioning  Home Living Lives With: Spouse Available Help at Discharge: Family;Available PRN/intermittently (wife works during the day but can provide 24/7) Type of Home: House Home Access: Level entry Home Layout: One level Bathroom Shower/Tub: Engineer, manufacturing systems: Handicapped height Bathroom Accessibility: Yes How Accessible: Accessible via walker Home Adaptive Equipment: Hand-held shower hose;Grab bars around toilet;Grab bars in shower;Shower chair with back;Straight cane;Walker - rolling Prior Function Level of Independence:  Independent Driving: Yes Vocation: Retired Musician: No difficulties Dominant Hand: Right    Cognition  Cognition Arousal/Alertness: Awake/alert Behavior During Therapy: WFL for tasks assessed/performed Overall Cognitive Status: Within Functional Limits for tasks assessed    Extremity/Trunk Assessment Right Upper Extremity Assessment RUE ROM/Strength/Tone: Within functional levels Left Upper Extremity Assessment LUE ROM/Strength/Tone: Within functional levels Right Lower Extremity Assessment RLE ROM/Strength/Tone: Within functional levels Left Lower Extremity Assessment LLE ROM/Strength/Tone: Within functional levels Trunk Assessment Trunk Assessment: Normal   Balance Balance Balance Assessed: Yes Static Standing Balance Static Standing - Balance Support: No upper extremity supported Static Standing - Level of Assistance: 5: Stand by assistance Static Standing - Comment/# of Minutes: 2 min  End of Session PT - End of Session Equipment Utilized During Treatment: Gait belt;Oxygen (3L O2 via [place]) Activity Tolerance: Patient limited by fatigue Patient left: in chair;with call bell/phone within reach;with family/visitor present Nurse Communication: Mobility status  GP     01/29/2013, 12:07 PM Marvis Moeller, Student Physical Therapist Office #: [phone]  Agree with above assessment.  Lewis Shock, PT, DPT Pager #: [phone] Office #: [phone]

## 2013-01-29 NOTE — Progress Notes (Signed)
Dr. Vassie Loll is made aware of patient's CO2 results of >45.  Patient's previous result was >45.  No new order noted at this time.  PAtient is currently on BiPap.

## 2013-01-30 ENCOUNTER — Telehealth: Payer: Self-pay | Admitting: Internal Medicine

## 2013-01-30 DIAGNOSIS — N189 Chronic kidney disease, unspecified: Secondary | ICD-10-CM

## 2013-01-30 DIAGNOSIS — N179 Acute kidney failure, unspecified: Secondary | ICD-10-CM

## 2013-01-30 LAB — BASIC METABOLIC PANEL
CO2: >45 mEq/L (ref 19–32)
Chloride: 92 mEq/L — ABNORMAL LOW (ref 96–112)
GFR calc Af Amer: 47 mL/min — ABNORMAL LOW (ref >90)
Potassium: 4 mEq/L (ref 3.5–5.1)
Sodium: 145 mEq/L (ref 135–145)

## 2013-01-30 LAB — GLUCOSE, CAPILLARY: Glucose-Capillary: 104 mg/dL — ABNORMAL HIGH (ref 70–99)

## 2013-01-30 MED ORDER — ALBUTEROL SULFATE (2.5 MG/3ML) 0.083% IN NEBU: 2.5000 mg | INHALATION_SOLUTION | Freq: Four times a day (QID) | RESPIRATORY_TRACT | Status: DC

## 2013-01-30 MED ORDER — FUROSEMIDE 40 MG PO TABS: 40.0000 mg | ORAL_TABLET | Freq: Two times a day (BID) | ORAL | Status: DC

## 2013-01-30 MED ORDER — PREDNISONE 20 MG PO TABS: ORAL_TABLET | ORAL | Status: DC

## 2013-01-30 NOTE — Progress Notes (Signed)
With critical result  Of CO2  >45.

## 2013-01-30 NOTE — Discharge Summary (Signed)
Physician Discharge Summary  Patient ID: [name] [name] MRN: [id number] DOB/AGE: [birth year] 62 y.o.  Admit date: 01/27/2013 Discharge date: 01/30/2013    Discharge Diagnoses:  Acute on Chronic Respiratory Failure OSA OHS Cor Pulmonale Hypoxemia Acute Diastolic CHF HTN CAD PVD HLD AKI BPH Hyperkalemia GERD Anemia Thrombocytopenia Acute Encephalopathy                                                         Falls                                                     DISCHARGE PLAN BY DIAGNOSIS    Acute on Chronic Respiratory Failure OSA OHS Cor Pulmonale Hypoxemia  Discharge Plan: -pt educated on CPAP compliance at home  -continue pulmicort, scheduled albuterol / atrovent (med cost has been an issue) -wean prednisone to off -given information for [employer] at [employer].  Pulmonary Rehab was a cost issue (45$ per visit) -RN home health to review medications and reinforce need for CPAP   Acute Diastolic CHF HTN CAD PVD HLD  Discharge Plan: -f/u BMP in office at follow up next week -Lasix 40 mg PO BID, & PRN additional 40 mg dosing if increased wt gain / LE swelling -low sodium heart healthy diet -continue plavix, asa, imdur, lopressor, crestor, PRN NTG   AKI BPH Hyperkalemia  Discharge Plan: -continue flomax  GERD  Discharge Plan: -continue protonix   Anemia Thrombocytopenia  Discharge Plan: -follow up CBC PRN with PCP  Acute Encephalopathy - in setting of hypercarbia                                                      Falls - negative plain films for knee / chest  Discharge Plan: -[facility] [employer]ne[employer] Program Pulm rehab eventually                   DISCHARGE SUMMARY   [name] [name] is a 62 y.o. y/o male, former smoker,  with a PMH of HTN, CAD, Diastolic CHF, GERD, Chronic Respiratory Failure, OSA on CPAP with non-compliance, Cor Pulmonale who presented to the [facility] ER on 6/16 with acute on chronic respiratory failure,  decompensated CHF, falls at home, increased lethargy & dyspnea.  On admit, he endorsed sleeping more during the day and not wearing his CPAP and that he had missed several doses of lasix.  In ER he was placed on BiPAP in the setting of ABG with pCO2 >130.  Even with elevated pCO2, he was able to wake and be intermittently appropriate with conversation.  He was not wearing his CPAP at home prior to admit.  Patient was admitted to ICU on BiPAP, given IV diuresis with quick resolution in acute encephalopathy.  Extensive discussions with patient during admit regarding importance of compliance with home BiPAP.                 SIGNIFICANT DIAGNOSTIC STUDIES 10/21/12 Echo >> mild LVH, EF 55 to  60%, grade 1 diastolic dysfx, mild AS, mild LA dilation   MICRO DATA  MRSA PCR 6/16>>>neg   Discharge Exam: General: No distress  Neuro: Alert, normal strength, follows commands  HEENT: No sinus tenderness  Cardiovascular: Regular, no murmur  Lungs: decreased breath sounds, no wheeze, faint rales at bases  Abdomen: soft, non tender  Musculoskeletal: 1+ edema -improved  Skin: chronic venous stasis   Filed Vitals:   01/30/13 0800 01/30/13 0900 01/30/13 1018 01/30/13 1100  BP: 138/69 160/76 128/68 122/57  Pulse: 71 88 81 73  Temp:    98.6 F (37 C)  TempSrc:      Resp: 21 28  16   Height:      Weight:      SpO2: 100% 99%  98%     Discharge Labs  BMET  Recent Labs Lab 01/27/13 1210 01/27/13 1625 01/28/13 0315 01/29/13 0535 01/30/13 0545  NA 143  --  139 143 145  K 4.6  --  5.3* 4.3 4.0  CL 94*  --  91* 92* 92*  CO2 >45*  --  44* >45* >45*  GLUCOSE 133*  --  153* 107* 98  BUN 39*  --  48* 61* 56*  CREATININE 1.82* 1.76* 1.84* 2.13* 1.74*  CALCIUM 9.4  --  9.2 9.0 9.1   CBC  Recent Labs Lab 01/27/13 1625 01/28/13 0315 01/29/13 0535  HGB 8.9* 9.0* 8.3*  HCT 32.6* 33.0* 30.1*  WBC 4.3 3.6* 5.8  PLT 93* 100* 107*    Discharge Orders   Future Appointments Provider  Department Dept Phone   02/04/2013 1:30 PM [facility] [phone]   02/05/2013 11:15 AM Waymon Budge, MD [facility] [phone]   03/13/2013 2:00 PM Runell Gess, MD [facility] [place] [phone]   Future Orders Complete By Expires     (HEART FAILURE PATIENTS) Call MD:  Anytime you have any of the following symptoms: 1) 3 pound weight gain in 24 hours or 5 pounds in 1 week 2) shortness of breath, with or without a dry hacking cough 3) swelling in the hands, feet or stomach 4) if you have to sleep on extra pillows at night in order to breathe.  As directed     Call MD for:  difficulty breathing, headache or visual disturbances  As directed     Call MD for:  persistant dizziness or light-headedness  As directed     Call MD for:  temperature >100.4  As directed     Diet - low sodium heart healthy  As directed     Discharge instructions  As directed     Comments:      1. Take your albuterol & atrovent every 6 hours by nebulization (okay to mix these together  2. Pulmicort nebulization twice daily (am / pm) 3. Taper off your prednisone as instructed 4. WEAR YOUR BIPAP!!!!  It will keep you out of the hospital! 5. Wear oxygen 3L per nasal cannula    Increase activity slowly  As directed            Follow-up Information   Please follow up. (Office will call you for appointment.  You will need lab draw (BMP) at office visit. )         Medication List    TAKE these medications       albuterol 108 (90 BASE) MCG/ACT inhaler  Commonly known as:  PROVENTIL  HFA;VENTOLIN HFA  Inhale 2 puffs into the lungs every 6 (six) hours as needed. For wheezing     albuterol (2.5 MG/3ML) 0.083% nebulizer solution  Commonly known as:  PROVENTIL  Take 3 mLs (2.5 mg total) by nebulization every 6 (six) hours.     aspirin EC 81 MG tablet  Take 81 mg by mouth daily.     BAYER CONTOUR TEST test  strip  Generic drug:  glucose blood     budesonide 0.25 MG/2ML nebulizer solution  Commonly known as:  PULMICORT  Take 2 mLs (0.25 mg total) by nebulization 2 (two) times daily. Dx: 496     clopidogrel 75 MG tablet  Commonly known as:  PLAVIX  Take 75 mg by mouth daily.     COLCRYS 0.6 MG tablet  Generic drug:  colchicine  Take 1 tablet by mouth daily.     furosemide 40 MG tablet  Commonly known as:  LASIX  Take 1 tablet (40 mg total) by mouth 2 (two) times daily. May take an additional 40 mg if he has additional edema.     ipratropium 0.02 % nebulizer solution  Commonly known as:  ATROVENT  Take 2.5 mLs (500 mcg total) by nebulization 4 (four) times daily. Dx: 496     isosorbide mononitrate 30 MG 24 hr tablet  Commonly known as:  IMDUR  Take 60 mg by mouth daily.     metoprolol tartrate 25 MG tablet  Commonly known as:  LOPRESSOR  Take 12.5 mg by mouth 2 (two) times daily.     multivitamin with minerals Tabs  Take 1 tablet by mouth daily.     nitroGLYCERIN 0.4 MG SL tablet  Commonly known as:  NITROSTAT  Place 1 tablet (0.4 mg total) under the tongue every 5 (five) minutes as needed. For chest pain     pantoprazole 40 MG tablet  Commonly known as:  PROTONIX  Take 1 tablet by mouth daily.     predniSONE 20 MG tablet  Commonly known as:  DELTASONE  1 tab daily for 4 days, then 1/2 tab daily for 4 days then stop     rosuvastatin 20 MG tablet  Commonly known as:  CRESTOR  Take 20 mg by mouth daily.     tamsulosin 0.4 MG Caps  Commonly known as:  FLOMAX  Take 1 tablet by mouth daily.          Disposition:  Home with home health RN.  Information given to patient for [employer] Program at [facility].    Discharged Condition: [name] [name] has met maximum benefit of inpatient care and is medically stable and cleared for discharge.  Patient is pending follow up as above.      Time spent on disposition:  Greater than 35 minutes.   Signed: [name],  NP-C [facility] Pgr: [phone] Office: [phone]   Independently examined pt, evaluated data & formulated above discharge care plan with NP   [facility] V.

## 2013-01-30 NOTE — Telephone Encounter (Signed)
Per Florentina Addison 02/05/13 at 11:15.   I called pt and made him aware of appt. Nothing further was needed

## 2013-01-30 NOTE — Progress Notes (Signed)
Diuresed well On 3 L [place] - baseline Renal fn better Plan Can dc on 40 bid lasix, rechk BMET in 5ds & FU Emphasized bipap compliance - admits to not using Outpt pulm rehab  [name] [name] V.

## 2013-01-30 NOTE — Progress Notes (Signed)
Physical Therapy Treatment Patient Details Name: AYDEEN BLUME MRN: [id number] DOB: [birth year] Today's Date: 01/30/2013 Time: 7829-5621 PT Time Calculation (min): 23 min  PT Assessment / Plan / Recommendation Comments on Treatment Session  Pt making steady progress with mobility.  Ambulated ~300' with SPC today at Supervision level.  Pt appropriate from mobility standpoint to safely d/c home when MD feels appropriate.      Follow Up Recommendations  Supervision/Assistance - 24 hour (cardiopulmonary rehab)     Does the patient have the potential to tolerate intense rehabilitation     Barriers to Discharge        Equipment Recommendations  Rolling walker with 5" wheels    Recommendations for Other Services    Frequency Min 3X/week   Plan Discharge plan remains appropriate;Frequency remains appropriate    Precautions / Restrictions Precautions Precautions: Fall Restrictions Weight Bearing Restrictions: No       Mobility  Bed Mobility Bed Mobility: Not assessed Transfers Transfers: Sit to Stand;Stand to Sit Sit to Stand: With upper extremity assist;With armrests;From chair/3-in-1;5: Supervision Stand to Sit: 5: Supervision;With upper extremity assist;With armrests;To chair/3-in-1 Ambulation/Gait Ambulation/Gait Assistance: 5: Supervision Ambulation Distance (Feet): 300 Feet Assistive device: Straight cane Ambulation/Gait Assistance Details: Supervision for safety.  No LOB noted but pt does state he feels a little more unsteady than at baseline.   Gait Pattern: Within Functional Limits Gait velocity: decreased General Gait Details: No standing rest breaks needed to complete distance.  Stairs: No Wheelchair Mobility Wheelchair Mobility: No      PT Goals Acute Rehab PT Goals Time For Goal Achievement: 02/12/13 Potential to Achieve Goals: Good Pt will go Supine/Side to Sit: with modified independence;with HOB 0 degrees Pt will go Sit to Stand: with modified  independence;with upper extremity assist PT Goal: Sit to Stand - Progress: Progressing toward goal Pt will Ambulate: >150 feet;with modified independence;with least restrictive assistive device PT Goal: Ambulate - Progress: Progressing toward goal Pt will Perform Home Exercise Program: Independently  Visit Information  Last PT Received On: 01/30/13 Assistance Needed: +1    Subjective Data      Cognition  Cognition Arousal/Alertness: Awake/alert Behavior During Therapy: WFL for tasks assessed/performed Overall Cognitive Status: Within Functional Limits for tasks assessed    Balance     End of Session PT - End of Session Equipment Utilized During Treatment: Gait belt;Oxygen (3L 02 ) Activity Tolerance: Patient tolerated treatment well Patient left: in chair;with call bell/phone within reach;with family/visitor present Nurse Communication: Mobility status     Verdell Face, [place] [phone] 01/30/2013

## 2013-01-30 NOTE — Care Management Note (Signed)
    Page 1 of 1   01/30/2013     1:10:56 PM   CARE MANAGEMENT NOTE 01/30/2013  Patient:  [name] [name], [name] [name]   Account Number:  [id number]  Date Initiated:  01/29/2013  Documentation initiated by:  Angelly Spearing  Subjective/Objective Assessment:   62yr-old male adm with dx of resp failure; lives with spouse, has BiPAP and home O2 through [employer]     DC Planning Services  CM consult      [facility] Choice  HOME HEALTH   Choice offered to / List presented to:  C-3 Spouse   HH arranged  HH-10 DISEASE MANAGEMENT  HH-1 RN      [facility] agency  [facility]   Status of service:  Completed, signed off  Discharge Disposition:  HOME W HOME HEALTH SERVICES  Per UR Regulation:  Reviewed for med. necessity/level of care/duration of stay  Comments:  PCP:  Dr Alwyn Pea  01/30/13 1057 Hudsyn Barich RN MSN BSN CCM Per insurance rep, pt will need to call customer service to determine benefit for OP pulmonary rehab. 1130 Per customer service, pt will have a copay of $45 each visit.  He states he is unable to afford and plans to start the [facility] Sne[facility] program@ the [facility] after receiving clearance from his pulmonologist.  Will need home health RN before starting with the Y.  Provided list of [facility] agencies, referral made per choice.  01/29/13 1251 Noemie Devivo RN MSN BSN CCM PT recommends f/u with pulmonary rehab as OP.  Obtained form for MD to complete for referral and discussed with pt. He states he was going to participate in pulmonary rehab in the past but insurance would not cover.  TC to [facility] liaison, vm message left requesting benefit check.  Pt also states he participated in some [facility] [employer]akers programs @ the [facility] in the past and would like to resume those classes if rehab not covered.  Printed class schedules for [facility] and [facility], provided to pt.

## 2013-02-03 ENCOUNTER — Other Ambulatory Visit (HOSPITAL_COMMUNITY): Payer: Self-pay | Admitting: *Deleted

## 2013-02-03 ENCOUNTER — Telehealth: Payer: Self-pay | Admitting: Internal Medicine

## 2013-02-03 NOTE — Telephone Encounter (Signed)
Spoke with the pt He states that his spouse told him that someone from here called them last wk  I advised that looks like this was regarding the pending appt he has this wk  Nothing further needed per pt

## 2013-02-04 ENCOUNTER — Telehealth: Payer: Self-pay | Admitting: Internal Medicine

## 2013-02-04 ENCOUNTER — Encounter (HOSPITAL_COMMUNITY): Payer: Medicare Other

## 2013-02-04 NOTE — Telephone Encounter (Signed)
Katie please advise thanks 

## 2013-02-05 ENCOUNTER — Ambulatory Visit (INDEPENDENT_AMBULATORY_CARE_PROVIDER_SITE_OTHER)
Admission: RE | Admit: 2013-02-05 | Discharge: 2013-02-05 | Disposition: A | Discharge status: Home / Self Care | Payer: Medicare Other | Source: Ambulatory Visit | Attending: Internal Medicine | Admitting: Internal Medicine

## 2013-02-05 ENCOUNTER — Encounter: Payer: Self-pay | Admitting: Internal Medicine

## 2013-02-05 ENCOUNTER — Ambulatory Visit (INDEPENDENT_AMBULATORY_CARE_PROVIDER_SITE_OTHER): Payer: Medicare Other | Admitting: Internal Medicine

## 2013-02-05 ENCOUNTER — Other Ambulatory Visit (INDEPENDENT_AMBULATORY_CARE_PROVIDER_SITE_OTHER): Payer: Medicare Other

## 2013-02-05 VITALS — BP 118/64 | HR 72 | Ht 73.0 in | Wt 303.0 lb

## 2013-02-05 DIAGNOSIS — I5032 Chronic diastolic (congestive) heart failure: Secondary | ICD-10-CM

## 2013-02-05 DIAGNOSIS — I509 Heart failure, unspecified: Secondary | ICD-10-CM

## 2013-02-05 DIAGNOSIS — J9610 Chronic respiratory failure, unspecified whether with hypoxia or hypercapnia: Secondary | ICD-10-CM

## 2013-02-05 LAB — BASIC METABOLIC PANEL
GFR: 46.88 mL/min — ABNORMAL LOW (ref >60.00)
Glucose, Bld: 137 mg/dL — ABNORMAL HIGH (ref 70–99)
Potassium: 5 mEq/L (ref 3.5–5.1)
Sodium: 138 mEq/L (ref 135–145)

## 2013-02-05 NOTE — Patient Instructions (Addendum)
Order- lab- BMET   Dx Chronic Diastolic CHF             CXR-

## 2013-02-05 NOTE — Progress Notes (Signed)
Patient ID: [name] [name], male    DOB: 05-Apr-1951, 62 y.o.   MRN: [id number]  HPI 01/23/11- COPD, CAD/diastolic dysfunction, OSA, chronic respiratory failure, obesity Hypoventilation, cor pulmonale Hosp since last here- once for "shakes"-they told him wasn't using CPAP right, once for epistaxis, once to place urinary stent, and has had a right ? renal biopsy. Told anemic.  Breathing stable, but can't lie flat- feels smothered even on O2.  Runs O2 2-3L/M usually. I gave permision to go to 4 during exertion. Notes "tiresome" feeling midchest, related to exertion/ climbing stairs and relieved by rest. Told to pace himself and not try to push through that.  Discussed need for O2. He doesn't sleep well with his BiPAP 15/12. Discussed sleep hygiene- discussed room temperature. He was more comfortable with autotitration in hosp and we discussed a trial of that at home.  Has restarted diuretic since leg edema has come back.   05/25/11-  COPD, CAD/diastolic dysfunction, OSA, chronic respiratory failure, obesity Hypoventilation, cor pulmonale Went to ER last week- short of breath. Air wasn't satisfying. Was started back on shot for anemia. Feels some better.  Reports sneezing, postnasal drainage and a sense of mucus in his upper chest. Denies fever, sore throat and doesn't think he has a cold.  06/28/11-  COPD, CAD/diastolic dysfunction, OSA, chronic respiratory failure, obesity Hypoventilation, cor pulmonale Recently hospitalized October 25-29, notes reviewed with him and x-ray images reviewed by me. He was hospitalized for hypercapnic respiratory failure with transient encephalopathy and renal insufficiency. Since discharge he has regained some ankle edema while off of furosemide. He has a nephrology appointment later this month. He is concerned that he was taken off his diabetes medicines and is directed to discuss this with his primary physician immediately. He has some persistent soreness across his  anterior chest wall at the level of the xiphoid but otherwise breathing better with less exertional dyspnea and before he went in the hospital. He remains dependent on continuous oxygen at 2 L. He continues his BiPAP machine all night, every night but says he was sleeping better with it  on autotitration with a lower pressure used in the hospital.  09/25/11- COPD, CAD/diastolic dysfunction, OSA, chronic respiratory failure, obesity Hypoventilation, cor pulmonale Since last here he was hospitalized for respiratory failure with CHF and obesity hypoventilation. Better with diuresis. BiPAP 13/10 is now comfortable and used all night every night with supplemental oxygen 4 L. Tolerated colonoscopy without respiratory distress. Uses a rescue inhaler only occasionally but says it does help then.  11/23/11- COPD, CAD/diastolic dysfunction, OSA, chronic respiratory failure, obesity Hypoventilation, cor pulmonale She is noticing some increased shortness of breath on exertion such as getting in the car but no increase in ankle edema which is well controlled. Occasional cough is not progressive or productive. Noticing more rhinorrhea with the pollen. Continues BiPAP  I 13/E 10 all night every night.  02/07/12- COPD, CAD/diastolic dysfunction, OSA, chronic respiratory failure, obesity Hypoventilation, cor pulmonale  pt states doing some better.wakes up out of a nap coughing feels like he's choking.  Post Hosp- 01/14/12- 01/23/12-discharge diagnoses reviewed with him: Coronary artery disease/CABG, diastolic dysfunction, COPD with chronic respiratory failure, acute respiratory failure with hypoxia, acute on chronic renal failure, DM. He was intubated for 4 days and treated with Zosyn, vancomycin and Avelox, Levaquin, Zithromax and Rocephin. Cultures negative. He was to wean off of prednisone over 3 days. He was hoarse after intubation but that has resolved. He is using oxygen 1-2 L and  BiPAP  13/10 at night. He feels he is  pretty close to his baseline.  03/28/12-  COPD, CAD/diastolic dysfunction/ chronic CHF, OSA, chronic respiratory failure, obesity Hypoventilation, cor pulmonale  6 MWT today. Pt states breathing has been "okay" since last OV. Pt states having trouble falling alseep-not sleeping well on BiPAP. Pt states mask fit well but thinks the pressure needs to be adjusted again. Currently 13/10+ oxygen at 2 L/ Advanced. He sleeps better in a recliner with oxygen but without his BiPAP. Getting iron injections for anemia. We discussed shortness of breath and anemia. Pain left anterior axillary line described as a sore ache over the past week. He is able to raise his arm. Thinks he maybe lifted something wrong. COPD assessment test (CAT) 21/40 CXR 01/14/12- 1 view- IMPRESSION:  Stable support apparatus. Residual mild interstitial prominence  with slight improvement in aeration. No convincing pulmonary  edema. Probable small left pleural effusion with left basilar  atelectasis or infiltrate.  Original Report Authenticated By: Natasha Mead, M.D.   11/08/12-COPD, CAD/diastolic dysfunction/ chronic CHF, OSA, chronic respiratory failure, obesity Hypoventilation, cor pulmonale Hospitalized again between March 9 and 12 for acute on chronic renal failure with congestive heart failure, chronic respiratory failure on BiPAP and acute respiratory failure with hypoxia. He says he is feeling better at this time. Continues oxygen 3 L/Advanced. He has been using BiPAP 13/10 but thinks he slept better with CPAP. We discussed options. Usually when he decompensates it is from fluid overload triggering CO2 retention and hypoventilation.  CXR 10/20/12 IMPRESSION:  Vascular congestion and cardiomegaly, with small bilateral pleural  effusions and bibasilar airspace opacification. Findings are most  compatible with recurrent pulmonary edema, though underlying  pneumonia cannot be excluded.  Original Report Authenticated By: Tonia Ghent,  M.D.  12/02/2012 Post Hospital follow up  Returns for a post hospital follow up .  Reports doing well overall but still having some SOB, thinks this may be due to his pressure not being high enough on BIPAP. Since decreasing BIPAP 1 month ago  down to 12/10 he does not feel as good. Trouble sleeping.  Was admitted for decompensated cor pulmonale w/ fluid overload with subsequent acute on chronic hypercarbic resp failure.  He admits he can not afford his advair and spiriva- therefore he is not taking.  He does not wear BIPAP everynight. We discussed the implications of this and untreated OHS/OSA. He naps a lot but does not wear BIPAP.  He was treated with diuresis with improvement.  Discharged on Lasix 40mg  . Lower dose due to bump in scr. Has OP follow up with Renal d/t renal insufficiency.   02/05/13- COPD, CAD/diastolic dysfunction/ chronic CHF, OSA, chronic respiratory failure, obesity Hypoventilation, cor pulmonale Hosp 6/16-6/19/14 Acute on Chronic Resp Failure Discharge Plan:  -pt educated on BiPAP compliance at home  -continue pulmicort, scheduled albuterol / atrovent (med cost has been an issue)  -wean prednisone to off  -given information for [employer] at [employer]. Pulmonary Rehab was a cost issue (45$ per visit)  -RN home health to review medications and reinforce need for BiPAP 12/12 wO2 2-3L for sleep, O2 3-4 awake.  FOLLOWS FOR: Breathing has improved since leaving the hospital. Reports DOE, chest tightness with exertion and slight coughing from time to time. Wife needs FMLA form. CXR 01/29/13 1View IMPRESSION:  Slightly improved aeration in the lungs may represent decreasing  edema.  Original Report Authenticated By: Richarda Overlie, M.   Review of Systems-see HPI Constitutional:  No- weight loss,  No-night sweats, fevers, chills, +fatigue, lassitude. HEENT:   No-  headaches, difficulty swallowing, tooth/dental problems, sore throat,       No-sneezing, itching, ear ache,  nasal congestion, post nasal drip,  CV:   no-orthopnea, PND,  Much less-swelling in lower extremities, anasarca, dizziness, palpitations Resp: + shortness of breath with exertion or at rest.              No-   productive cough,  + non-productive cough,  No-  coughing up of blood.              No-   change in color of mucus.  No- wheezing.   Skin: No-   rash or lesions. GI:  No-   heartburn, indigestion, abdominal pain, nausea, vomiting,  GU: MS:  No-   joint pain or swelling.  Neuro- nothing unusual Psych:  No- change in mood or affect. No depression or anxiety.  No memory loss. Objective:  OBJ- Physical Exam General- Alert, Oriented, Affect-appropriate, Distress- none acute, obese, O2 3-4 LPM Skin- rash-none, lesions- none, excoriation- none Lymphadenopathy- none Head- atraumatic            Eyes- Gross vision intact, PERRLA, conjunctivae and secretions clear            Ears- Hearing, canals-normal            Nose- Clear, no-Septal dev, mucus, polyps, erosion, perforation             Throat- Mallampati II , mucosa clear , drainage- none, tonsils- atrophic Neck- flexible , trachea midline, no stridor , thyroid nl, carotid no bruit Chest - symmetrical excursion , unlabored           Heart/CV- RRRsoft , no murmur , no gallop  , no rub, nl s1 s2                           - JVD-+trace , edema- none, stasis changes+, varices- none           Lung- clear to P&A, no rales, wheeze- none, cough- none , dullness-none, rub- none           Chest wall-  Abd-  Br/ Gen/ Rectal- Not done, not indicated Extrem- cyanosis- none, clubbing, none, atrophy- none, strength- nl Neuro- grossly intact to observation

## 2013-02-06 ENCOUNTER — Telehealth: Payer: Self-pay | Admitting: Internal Medicine

## 2013-02-06 NOTE — Telephone Encounter (Signed)
Called and spoke with pt and he is aware of cxr results per CY.  Pt voiced his understanding of these results and nothing further is needed.

## 2013-02-06 NOTE — Telephone Encounter (Signed)
Spoke with Mary-patients wife;aware that we just got the FMLA paper work late Wednesday afternoon. She needs the FMLA papers turned back in to her job by July 2,2014. Corrie Dandy is aware that I will have CY sign the forms and return to [employer]. They will contact her when the forms have been sent( I confirmed this with Patsy in [employer]). Will sign off on message.

## 2013-02-10 ENCOUNTER — Other Ambulatory Visit: Payer: Self-pay | Admitting: *Deleted

## 2013-02-10 ENCOUNTER — Encounter (HOSPITAL_COMMUNITY)
Admission: RE | Admit: 2013-02-10 | Discharge: 2013-02-10 | Disposition: A | Discharge status: Home / Self Care | Payer: Medicare Other | Source: Ambulatory Visit | Attending: Nephrology | Admitting: Nephrology

## 2013-02-10 DIAGNOSIS — N183 Chronic kidney disease, stage 3 unspecified: Secondary | ICD-10-CM | POA: Insufficient documentation

## 2013-02-10 DIAGNOSIS — D638 Anemia in other chronic diseases classified elsewhere: Secondary | ICD-10-CM | POA: Insufficient documentation

## 2013-02-10 LAB — RENAL FUNCTION PANEL
Albumin: 3.5 g/dL (ref 3.5–5.2)
BUN: 43 mg/dL — ABNORMAL HIGH (ref 6–23)
Calcium: 9.2 mg/dL (ref 8.4–10.5)
Creatinine, Ser: 2.09 mg/dL — ABNORMAL HIGH (ref 0.50–1.35)
Phosphorus: 2.5 mg/dL (ref 2.3–4.6)

## 2013-02-10 LAB — IRON AND TIBC
Iron: 55 ug/dL (ref 42–135)
Saturation Ratios: 22 % (ref 20–55)
TIBC: 250 ug/dL (ref 215–435)
UIBC: 195 ug/dL (ref 125–400)

## 2013-02-10 MED ORDER — DARBEPOETIN ALFA-POLYSORBATE 500 MCG/ML IJ SOLN
60.0000 ug | INTRAMUSCULAR | Status: DC
  Filled 2013-02-10: qty 1

## 2013-02-10 MED ORDER — DARBEPOETIN ALFA-POLYSORBATE 60 MCG/0.3ML IJ SOLN
INTRAMUSCULAR | Status: AC
  Administered 2013-02-10: 60 ug
  Filled 2013-02-10: qty 0.3

## 2013-02-10 MED ORDER — METOPROLOL TARTRATE 25 MG PO TABS: 12.5000 mg | ORAL_TABLET | Freq: Two times a day (BID) | ORAL | Status: DC

## 2013-02-10 NOTE — Telephone Encounter (Signed)
Refills authorized for metoprolol

## 2013-02-11 ENCOUNTER — Other Ambulatory Visit: Payer: Self-pay | Admitting: *Deleted

## 2013-02-12 NOTE — Progress Notes (Signed)
Quick Note:  LMTCB ______ 

## 2013-02-12 NOTE — Progress Notes (Signed)
Quick Note:  Pt aware of results. ______ 

## 2013-02-18 ENCOUNTER — Telehealth: Payer: Self-pay | Admitting: Internal Medicine

## 2013-02-18 NOTE — Telephone Encounter (Signed)
Sending original FMLA to Dr. Maple Hudson, wife is requesting changes to her forms to reflect #1 Duration: change to 1 year (page 2) and #7 Frequency change to "per week" and Duration change to "5 days per episode". Please return to Medical Records. rmf

## 2013-02-19 ENCOUNTER — Telehealth: Payer: Self-pay | Admitting: Cardiovascular Disease

## 2013-02-19 NOTE — Telephone Encounter (Signed)
Called and leave message on personal voicemail of amber about patient EF.

## 2013-02-19 NOTE — Telephone Encounter (Signed)
Message forwarded to K. Petra Kuba, Charity fundraiser. (Covered by Tamela Oddi, CMA)

## 2013-02-19 NOTE — Telephone Encounter (Signed)
Need his last ejection fraction!

## 2013-02-20 NOTE — Assessment & Plan Note (Signed)
He agrees to try to be more faithful with this in hopes of staying out of the hospital. I explained how BiPAP helps control his heart failure.

## 2013-02-25 ENCOUNTER — Telehealth: Payer: Self-pay | Admitting: Internal Medicine

## 2013-02-25 NOTE — Telephone Encounter (Signed)
According to phone note by regina flowers on 02/18/13 these were being sent up to Dr. Maple Hudson for changes requested by spouse. Please advise Dr. Maple Hudson if you have this. thanks

## 2013-02-26 ENCOUNTER — Telehealth: Payer: Self-pay | Admitting: Internal Medicine

## 2013-02-26 ENCOUNTER — Telehealth: Payer: Self-pay | Admitting: Cardiovascular Disease

## 2013-02-26 NOTE — Telephone Encounter (Signed)
Duplicate message see phone note 02/25/13

## 2013-02-26 NOTE — Telephone Encounter (Signed)
Paperwork has been revised and sent back down to [employer]. Pt spouse is aware. Carron Curie, CMA

## 2013-02-26 NOTE — Telephone Encounter (Signed)
Wants an ejection fraction result!

## 2013-02-26 NOTE — Telephone Encounter (Signed)
Spouse is calling back. Checking on the status of the paperwork. Please advise hanks

## 2013-03-03 NOTE — Telephone Encounter (Signed)
Left message that per echo on 10/21/12, EF was 55-60%

## 2013-03-10 ENCOUNTER — Encounter (HOSPITAL_COMMUNITY)
Admission: RE | Admit: 2013-03-10 | Discharge: 2013-03-10 | Disposition: A | Discharge status: Home / Self Care | Payer: Medicare Other | Source: Ambulatory Visit | Attending: Nephrology | Admitting: Nephrology

## 2013-03-10 DIAGNOSIS — N183 Chronic kidney disease, stage 3 unspecified: Secondary | ICD-10-CM | POA: Insufficient documentation

## 2013-03-10 DIAGNOSIS — D638 Anemia in other chronic diseases classified elsewhere: Secondary | ICD-10-CM | POA: Insufficient documentation

## 2013-03-10 LAB — RENAL FUNCTION PANEL
CO2: >45 mEq/L (ref 19–32)
Calcium: 10.1 mg/dL (ref 8.4–10.5)
Creatinine, Ser: 1.79 mg/dL — ABNORMAL HIGH (ref 0.50–1.35)
Glucose, Bld: 148 mg/dL — ABNORMAL HIGH (ref 70–99)

## 2013-03-10 MED ORDER — DARBEPOETIN ALFA-POLYSORBATE 60 MCG/0.3ML IJ SOLN
INTRAMUSCULAR | Status: AC
  Administered 2013-03-10: 60 ug via SUBCUTANEOUS
  Filled 2013-03-10: qty 0.3

## 2013-03-10 MED ORDER — DARBEPOETIN ALFA-POLYSORBATE 500 MCG/ML IJ SOLN
60.0000 ug | INTRAMUSCULAR | Status: DC
  Filled 2013-03-10: qty 1

## 2013-03-13 ENCOUNTER — Ambulatory Visit (INDEPENDENT_AMBULATORY_CARE_PROVIDER_SITE_OTHER): Payer: Medicare Other | Admitting: Cardiovascular Disease

## 2013-03-13 ENCOUNTER — Encounter: Payer: Self-pay | Admitting: Cardiovascular Disease

## 2013-03-13 VITALS — BP 126/66 | HR 86 | Ht 72.0 in | Wt 302.6 lb

## 2013-03-13 DIAGNOSIS — Z79899 Other long term (current) drug therapy: Secondary | ICD-10-CM

## 2013-03-13 DIAGNOSIS — R0602 Shortness of breath: Secondary | ICD-10-CM

## 2013-03-13 DIAGNOSIS — I2510 Atherosclerotic heart disease of native coronary artery without angina pectoris: Secondary | ICD-10-CM

## 2013-03-13 DIAGNOSIS — I10 Essential (primary) hypertension: Secondary | ICD-10-CM

## 2013-03-13 DIAGNOSIS — E785 Hyperlipidemia, unspecified: Secondary | ICD-10-CM

## 2013-03-13 MED ORDER — ISOSORBIDE MONONITRATE ER 60 MG PO TB24: 60.0000 mg | ORAL_TABLET | Freq: Every day | ORAL | Status: DC

## 2013-03-13 NOTE — Patient Instructions (Addendum)
  We will see you back in follow up in 3 months with Wilburt Finlay PA and 6 months with Dr Allyson Sabal  Dr Allyson Sabal has ordered bloodwork to be done next week, fasting

## 2013-03-13 NOTE — Assessment & Plan Note (Signed)
Patient has preserved LV function with diastolic dysfunction and lower extreme edema. This has been treated effectively with oral diuretics with decrease in his weight and edema. 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

## 2013-03-13 NOTE — Progress Notes (Signed)
03/13/2013 [name] [name]   [birth year]  [id number]  Primary Physician Alt[place], MD Primary Cardiologist: [name] Gess MD [name]   HPI:  The patient is a 62 year old obese African American male with history of coronary artery disease, diastolic dysfunction, COPD who is O2 dependent, peripheral vascular disease, chronic renal insufficiency stage 3, diabetes mellitus, hypertension and obstructive sleep apnea. The patient was hospitalized earlier in the year with acute respiratory failure and hypoxia, likely related to volume overload from diastolic dyslipidemia. He was diuresed with IV Lasix, metolazone. He was also seen from April 3rd to April 7th, apparently with a COPD exacerbation. The patient has a stent in the left SFA which was put in in September of 2009 for claudication. He also does have a short-segment occlusion of the right mid SFA with tibioperoneal disease as well. He had coronary artery bypass grafting in 2001 with PCI stenting of his OM branch in December of 2005. Last cardiac catheterization was in July of 2011 revealing a patent LIMA to the LAD, 80% stenosis beyond that, and a patent vein to the large venous branch, a total vein to the PDA which was restented using a Taxus ion drug-eluting stent with a patent circumflex to the obtuse marginal branch and normal LV function.   His last nuclear stress test was February of this year. His ejection fraction was 62%; it was a low risk study with normal nuclear imaging. No significant change from previous study. This study was done for ST changes to his EKG which are still present and look similar to previous EKG with a right bundle branch block and slightly tachycardic at 104.   The patient presents today for followup. He states that his lower extremity edema has been getting better for the last week, and in actuality his weight is down 8 pounds from the last office visit in March. He has been taking Lasix 80 mg  daily. He does sleep with 2 pillows, and he does complain of some dyspnea on exertion which is better with rest. He also complains of some chest pain which comes and goes, typically with walking. He has taken nitroglycerin once last week and it seemed to help. He denies nausea, vomiting, fever, orthopnea, PND, abdominal pain, hematuria, hematochezia.  He was last seen in the office 12/11/12 by [name] Bienenstock PA-C. I saw him 6 months ago. He has remained stable. His medications have been adjusted including his diuretic as long-acting oral nitrate. He has lost 20 pounds in last 6 months and no longer has lower extremity edema.    Current Outpatient Prescriptions  Medication Sig Dispense Refill  . albuterol (PROVENTIL HFA;VENTOLIN HFA) 108 (90 BASE) MCG/ACT inhaler Inhale 2 puffs into the lungs every 6 (six) hours as needed. For wheezing      . albuterol (PROVENTIL) (2.5 MG/3ML) 0.083% nebulizer solution Take 3 mLs (2.5 mg total) by nebulization every 6 (six) hours.  75 mL  12  . aspirin EC 81 MG tablet Take 81 mg by mouth daily.      Marland Kitchen BAYER CONTOUR TEST test strip       . budesonide (PULMICORT) 0.25 MG/2ML nebulizer solution Take 2 mLs (0.25 mg total) by nebulization 2 (two) times daily. Dx: 496  120 mL  5  . clopidogrel (PLAVIX) 75 MG tablet Take 75 mg by mouth daily.      Marland Kitchen COLCRYS 0.6 MG tablet Take 1 tablet by mouth daily.      . furosemide (LASIX) 40  MG tablet Take 1 tablet (40 mg total) by mouth 2 (two) times daily. May take an additional 40 mg if he has additional edema.  60 tablet  3  . ipratropium (ATROVENT) 0.02 % nebulizer solution Take 2.5 mLs (500 mcg total) by nebulization 4 (four) times daily. Dx: 496  300 mL  5  . isosorbide mononitrate (IMDUR) 60 MG 24 hr tablet Take 1 tablet (60 mg total) by mouth daily.  90 tablet  3  . metoprolol tartrate (LOPRESSOR) 25 MG tablet Take 0.5 tablets (12.5 mg total) by mouth 2 (two) times daily.  30 tablet  10  . Multiple Vitamin (MULITIVITAMIN WITH  MINERALS) TABS Take 1 tablet by mouth daily.      . nitroGLYCERIN (NITROSTAT) 0.4 MG SL tablet Place 1 tablet (0.4 mg total) under the tongue every 5 (five) minutes as needed. For chest pain  25 tablet  6  . pantoprazole (PROTONIX) 40 MG tablet Take 1 tablet by mouth daily.      . rosuvastatin (CRESTOR) 20 MG tablet Take 20 mg by mouth daily.       . Tamsulosin HCl (FLOMAX) 0.4 MG CAPS Take 1 tablet by mouth daily.       No current facility-administered medications for this visit.   Facility-Administered Medications Ordered in Other Visits  Medication Dose Route Frequency Provider Last Rate Last Dose  . darbepoetin alfa-polysorbate (ARANESP) injection 40 mcg  40 mcg Subcutaneous Q14 Days Irena Cords, MD   40 mcg at 06/19/11 1345    Allergies  Allergen Reactions  . Amlodipine Besy-Benazepril Hcl Swelling    Lips swell  . Percocet (Oxycodone-Acetaminophen) Other (See Comments)    hallucinations    History   Social History  . Marital Status: Married    Spouse Name: N/A    Number of Children: N/A  . Years of Education: N/A   Occupational History  . Not on file.   Social History Main Topics  . Smoking status: Former Smoker -- 1.00 packs/day for 25 years    Types: Cigarettes    Quit date: 08/14/1998  . Smokeless tobacco: Never Used  . Alcohol Use: Yes     Comment: social  . Drug Use: No  . Sexually Active: Yes   Other Topics Concern  . Not on file   Social History Narrative  . No narrative on file     Review of Systems: General: negative for chills, fever, night sweats or weight changes.  Cardiovascular: negative for chest pain, dyspnea on exertion, edema, orthopnea, palpitations, paroxysmal nocturnal dyspnea or shortness of breath Dermatological: negative for rash Respiratory: negative for cough or wheezing Urologic: negative for hematuria Abdominal: negative for nausea, vomiting, diarrhea, bright red blood per rectum, melena, or hematemesis Neurologic:  negative for visual changes, syncope, or dizziness All other systems reviewed and are otherwise negative except as noted above.    Blood pressure 126/66, pulse 86, height 6' (1.829 m), weight 302 lb 9.6 oz (137.258 kg).  General appearance: alert and no distress Neck: no adenopathy, no carotid bruit, no JVD, supple, symmetrical, trachea midline and thyroid not enlarged, symmetric, no tenderness/mass/nodules Lungs: clear to auscultation bilaterally Heart: regular rate and rhythm, S1, S2 normal, no murmur, click, rub or gallop Extremities: extremities normal, atraumatic, no cyanosis or edema  EKG sinus rhythm at 86 with right bundle branch block and left anterior fascicular block unchanged from prior EKGs  ASSESSMENT AND PLAN:   CORONARY ARTERY DISEASE, CABG 2001, last PCI 7/11 Known  coronary disease status post aorta bypass grafting in 2001 status post PCI of the OM branch December 2005. I recast him 02/16/10 revealing a patent LIMA to his LAD, 80% stenosis beyond that a patent vein to a large ramus branch, total vein to the PDA which I re stentedusing a drug-eluting stent. .his last Myoview was performed 6 months ago and was nonischemic.he gets occasional chest pain which does not sound ischemic. He has normal LV function.  CHF (congestive heart failure) Patient has preserved LV function with diastolic dysfunction and lower extreme edema. This has been treated effectively with oral diuretics with decrease in his weight and edema. 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      [name] Gess MD FACP,FACC,FAHA, [facility] 03/13/2013 2:34 PM

## 2013-03-13 NOTE — Assessment & Plan Note (Signed)
Known coronary disease status post aorta bypass grafting in 2001 status post PCI of the OM branch December 2005. I recast him 02/16/10 revealing a patent LIMA to his LAD, 80% stenosis beyond that a patent vein to a large ramus branch, total vein to the PDA which I re stentedusing a drug-eluting stent. .his last Myoview was performed 6 months ago and was nonischemic.he gets occasional chest pain which does not sound ischemic. He has normal LV function.

## 2013-03-17 ENCOUNTER — Other Ambulatory Visit: Payer: Self-pay | Admitting: *Deleted

## 2013-03-17 MED ORDER — CLOPIDOGREL BISULFATE 75 MG PO TABS: 75.0000 mg | ORAL_TABLET | Freq: Every day | ORAL | Status: DC

## 2013-03-17 NOTE — Telephone Encounter (Signed)
Rx was sent to pharmacy electronically. 

## 2013-03-19 DIAGNOSIS — I509 Heart failure, unspecified: Secondary | ICD-10-CM | POA: Insufficient documentation

## 2013-03-19 DIAGNOSIS — I2510 Atherosclerotic heart disease of native coronary artery without angina pectoris: Secondary | ICD-10-CM | POA: Insufficient documentation

## 2013-03-19 DIAGNOSIS — E119 Type 2 diabetes mellitus without complications: Secondary | ICD-10-CM | POA: Insufficient documentation

## 2013-03-19 DIAGNOSIS — R0602 Shortness of breath: Secondary | ICD-10-CM | POA: Insufficient documentation

## 2013-03-19 DIAGNOSIS — Z79899 Other long term (current) drug therapy: Secondary | ICD-10-CM | POA: Insufficient documentation

## 2013-03-19 DIAGNOSIS — Z9861 Coronary angioplasty status: Secondary | ICD-10-CM | POA: Insufficient documentation

## 2013-03-19 DIAGNOSIS — Z8709 Personal history of other diseases of the respiratory system: Secondary | ICD-10-CM | POA: Insufficient documentation

## 2013-03-19 DIAGNOSIS — Z8679 Personal history of other diseases of the circulatory system: Secondary | ICD-10-CM | POA: Insufficient documentation

## 2013-03-19 DIAGNOSIS — Z8639 Personal history of other endocrine, nutritional and metabolic disease: Secondary | ICD-10-CM | POA: Insufficient documentation

## 2013-03-19 DIAGNOSIS — Z8659 Personal history of other mental and behavioral disorders: Secondary | ICD-10-CM | POA: Insufficient documentation

## 2013-03-19 DIAGNOSIS — N189 Chronic kidney disease, unspecified: Secondary | ICD-10-CM | POA: Insufficient documentation

## 2013-03-19 DIAGNOSIS — Z862 Personal history of diseases of the blood and blood-forming organs and certain disorders involving the immune mechanism: Secondary | ICD-10-CM | POA: Insufficient documentation

## 2013-03-19 DIAGNOSIS — K219 Gastro-esophageal reflux disease without esophagitis: Secondary | ICD-10-CM | POA: Insufficient documentation

## 2013-03-19 DIAGNOSIS — Z951 Presence of aortocoronary bypass graft: Secondary | ICD-10-CM | POA: Insufficient documentation

## 2013-03-19 DIAGNOSIS — Z7982 Long term (current) use of aspirin: Secondary | ICD-10-CM | POA: Insufficient documentation

## 2013-03-19 DIAGNOSIS — G4730 Sleep apnea, unspecified: Secondary | ICD-10-CM | POA: Insufficient documentation

## 2013-03-19 DIAGNOSIS — Z87891 Personal history of nicotine dependence: Secondary | ICD-10-CM | POA: Insufficient documentation

## 2013-03-19 DIAGNOSIS — J441 Chronic obstructive pulmonary disease with (acute) exacerbation: Secondary | ICD-10-CM | POA: Insufficient documentation

## 2013-03-19 DIAGNOSIS — I129 Hypertensive chronic kidney disease with stage 1 through stage 4 chronic kidney disease, or unspecified chronic kidney disease: Secondary | ICD-10-CM | POA: Insufficient documentation

## 2013-03-19 DIAGNOSIS — M25569 Pain in unspecified knee: Secondary | ICD-10-CM | POA: Insufficient documentation

## 2013-03-19 DIAGNOSIS — R040 Epistaxis: Secondary | ICD-10-CM | POA: Insufficient documentation

## 2013-03-20 ENCOUNTER — Encounter: Payer: Self-pay | Admitting: Pulmonary Disease

## 2013-03-20 ENCOUNTER — Encounter (HOSPITAL_COMMUNITY): Payer: Self-pay | Admitting: Adult Health

## 2013-03-20 ENCOUNTER — Emergency Department (HOSPITAL_COMMUNITY)
Admission: EM | Admit: 2013-03-20 | Discharge: 2013-03-20 | Disposition: A | Discharge status: Home / Self Care | Payer: Medicare Other | Attending: Emergency Medicine | Admitting: Emergency Medicine

## 2013-03-20 DIAGNOSIS — R040 Epistaxis: Secondary | ICD-10-CM

## 2013-03-20 MED ORDER — CEPHALEXIN 500 MG PO CAPS: ORAL_CAPSULE | ORAL | Status: DC

## 2013-03-20 NOTE — ED Provider Notes (Addendum)
CSN: [id number]     Arrival date & time 03/19/13  2358 History     First MD Initiated Contact with Patient 03/20/13 0007     Chief Complaint  Patient presents with  . Epistaxis   (Consider location/radiation/quality/duration/timing/severity/associated sxs/prior Treatment) HPI Sudden onset right nasal epistaxis 45 minutes prior to arrival no lightheadedness no chest pain slight shortness of breath due to spitting and coughing out blood clots from the back of his throat no abdominal pain no vomiting no treatment prior to arrival. On chronic 3 L oxygen at home. Past Medical History  Diagnosis Date  . Heart disease, unspecified   . Coronary atherosclerosis of unspecified type of vessel, native or graft   . Chronic pulmonary heart disease, unspecified   . Other hyperalimentation   . Chronic respiratory failure   . Unspecified sleep apnea   . Chronic airway obstruction, not elsewhere classified   . Angina   . Shortness of breath   . Anemia   . Chronic kidney disease   . Anxiety   . Hypertension   . CHF (congestive heart failure)   . Blood transfusion     no reaction from transfusion  . Arthritis     hx of gout  . Diabetes mellitus   . CORONARY ARTERY DISEASE 10/28/2007  . CPAP (continuous positive airway pressure) dependence   . GERD (gastroesophageal reflux disease)    Past Surgical History  Procedure Laterality Date  . Coronary artery bypass graft    . Appendectomy    . Bilateral vats ablation    . Coronary angioplasty with stent placement    . Hemmorhoids    . Lung sx  2005    Growth on outside of right lung  . Esophagogastroduodenoscopy  08/11/2011    Procedure: ESOPHAGOGASTRODUODENOSCOPY (EGD);  Surgeon: Barrie Folk, MD;  Location: [facility] ENDOSCOPY;  Service: Endoscopy;  Laterality: N/A;  . Colonoscopy  08/11/2011    Procedure: COLONOSCOPY;  Surgeon: Barrie Folk, MD;  Location: [facility] ENDOSCOPY;  Service: Endoscopy;  Laterality: N/A;  . Cataract extraction     Family  History  Problem Relation Age of Onset  . Diabetes Sister    History  Substance Use Topics  . Smoking status: Former Smoker -- 1.00 packs/day for 25 years    Types: Cigarettes    Quit date: 08/14/1998  . Smokeless tobacco: Never Used  . Alcohol Use: Yes     Comment: social    Review of Systems 10 Systems reviewed and are negative for acute change except as noted in the HPI. Allergies  Amlodipine besy-benazepril hcl and Percocet  Home Medications   Current Outpatient Rx  Name  Route  Sig  Dispense  Refill  . albuterol (PROVENTIL HFA;VENTOLIN HFA) 108 (90 BASE) MCG/ACT inhaler   Inhalation   Inhale 2 puffs into the lungs every 6 (six) hours as needed. For wheezing         . albuterol (PROVENTIL) (2.5 MG/3ML) 0.083% nebulizer solution   Nebulization   Take 3 mLs (2.5 mg total) by nebulization every 6 (six) hours.   75 mL   12   . aspirin EC 81 MG tablet   Oral   Take 81 mg by mouth daily.         . budesonide (PULMICORT) 0.25 MG/2ML nebulizer solution   Nebulization   Take 2 mLs (0.25 mg total) by nebulization 2 (two) times daily. Dx: 496   120 mL   5   . clopidogrel (PLAVIX)  75 MG tablet   Oral   Take 1 tablet (75 mg total) by mouth daily.   30 tablet   11   . COLCRYS 0.6 MG tablet   Oral   Take 0.6 mg by mouth daily.          . furosemide (LASIX) 40 MG tablet   Oral   Take 1 tablet (40 mg total) by mouth 2 (two) times daily. May take an additional 40 mg if he has additional edema.   60 tablet   3   . ipratropium (ATROVENT) 0.02 % nebulizer solution   Nebulization   Take 2.5 mLs (500 mcg total) by nebulization 4 (four) times daily. Dx: 496   300 mL   5   . isosorbide mononitrate (IMDUR) 60 MG 24 hr tablet   Oral   Take 1 tablet (60 mg total) by mouth daily.   90 tablet   3   . metoprolol tartrate (LOPRESSOR) 25 MG tablet   Oral   Take 0.5 tablets (12.5 mg total) by mouth 2 (two) times daily.   30 tablet   10   . Multiple Vitamin  (MULITIVITAMIN WITH MINERALS) TABS   Oral   Take 1 tablet by mouth daily.         . nitroGLYCERIN (NITROSTAT) 0.4 MG SL tablet   Sublingual   Place 1 tablet (0.4 mg total) under the tongue every 5 (five) minutes as needed. For chest pain   25 tablet   6   . pantoprazole (PROTONIX) 40 MG tablet   Oral   Take 40 mg by mouth daily.          . rosuvastatin (CRESTOR) 20 MG tablet   Oral   Take 20 mg by mouth daily.          . Tamsulosin HCl (FLOMAX) 0.4 MG CAPS   Oral   Take 0.4 mg by mouth daily.          . cephALEXin (KEFLEX) 500 MG capsule      2 caps po bid x 3 days   12 capsule   0    BP 149/85  Pulse 92  Temp(Src) 98.2 F (36.8 C) (Oral)  Resp 34  SpO2 99% Physical Exam  Nursing note and vitals reviewed. Constitutional:  Awake, alert, nontoxic appearance.  HENT:  Head: Atraumatic.  Mouth/Throat: Oropharynx is clear and moist.  Right anterior epistaxis  Eyes: Right eye exhibits no discharge. Left eye exhibits no discharge.  Neck: Neck supple.  Cardiovascular: Normal rate and regular rhythm.   No murmur heard. Pulmonary/Chest: Effort normal and breath sounds normal. No respiratory distress. He has no wheezes. He has no rales. He exhibits no tenderness.  Abdominal: Soft. There is no tenderness. There is no rebound.  Musculoskeletal: He exhibits no tenderness.  Baseline ROM, no obvious new focal weakness.  Neurological:  Mental status and motor strength appears baseline for patient and situation.  Skin: No rash noted.  Psychiatric: He has a normal mood and affect.    ED Course  Timeout taken. Right anterior epistaxis visualized, application of QR Powder did not work, Rapid Rhino 5.5cm inserted which controlled the bleeding. Patient tolerated procedure well with no apparent immediate complications.  Pt feels improved after observation and/or treatment in ED.Patient / Family / Caregiver informed of clinical course, understand medical decision-making  process, and agree with plan. Procedures (including critical care time) ECG: Normal sinus rhythm, ventricular rate 94, right bundle branch block, right axis deviation,  no acute ischemic changes noted, no comparison ECG available Labs Reviewed - No data to display No results found. 1. Anterior epistaxis     MDM  I doubt any other EMC precluding discharge at this time including, but not necessarily limited to the following: Airway compromise.  Hurman Horn, MD 03/20/13 1610  Hurman Horn, MD 03/20/13 2122

## 2013-03-20 NOTE — ED Notes (Addendum)
Presents with one hour of epistaxis and SOB, spitting out clots. Bilateral breath sounds diminished. Nose actively bleeding at this time. Dr. Fonnie Jarvis at bedside.  Sats 70%, placed on Nonrebreather.  Sats increased to 100%

## 2013-03-20 NOTE — ED Notes (Addendum)
Pt. Began having a nose bleed approx ago and became SOB. Pt. Normally on 3L O2 at home.  On arrival to room patient O2 100% on non-rebreather and nose bleed controlled. Pt. Denies SOB at this time.

## 2013-03-22 ENCOUNTER — Encounter (HOSPITAL_COMMUNITY): Payer: Self-pay

## 2013-03-22 ENCOUNTER — Emergency Department (HOSPITAL_COMMUNITY)
Admission: EM | Admit: 2013-03-22 | Discharge: 2013-03-22 | Disposition: A | Discharge status: Home / Self Care | Payer: Medicare Other | Attending: Emergency Medicine | Admitting: Emergency Medicine

## 2013-03-22 DIAGNOSIS — Z8659 Personal history of other mental and behavioral disorders: Secondary | ICD-10-CM | POA: Insufficient documentation

## 2013-03-22 DIAGNOSIS — Z79899 Other long term (current) drug therapy: Secondary | ICD-10-CM | POA: Insufficient documentation

## 2013-03-22 DIAGNOSIS — Z8679 Personal history of other diseases of the circulatory system: Secondary | ICD-10-CM | POA: Insufficient documentation

## 2013-03-22 DIAGNOSIS — I509 Heart failure, unspecified: Secondary | ICD-10-CM | POA: Insufficient documentation

## 2013-03-22 DIAGNOSIS — Z862 Personal history of diseases of the blood and blood-forming organs and certain disorders involving the immune mechanism: Secondary | ICD-10-CM | POA: Insufficient documentation

## 2013-03-22 DIAGNOSIS — Z8709 Personal history of other diseases of the respiratory system: Secondary | ICD-10-CM | POA: Insufficient documentation

## 2013-03-22 DIAGNOSIS — R040 Epistaxis: Secondary | ICD-10-CM | POA: Insufficient documentation

## 2013-03-22 DIAGNOSIS — Z9861 Coronary angioplasty status: Secondary | ICD-10-CM | POA: Insufficient documentation

## 2013-03-22 DIAGNOSIS — M129 Arthropathy, unspecified: Secondary | ICD-10-CM | POA: Insufficient documentation

## 2013-03-22 DIAGNOSIS — R319 Hematuria, unspecified: Secondary | ICD-10-CM | POA: Insufficient documentation

## 2013-03-22 DIAGNOSIS — Z87891 Personal history of nicotine dependence: Secondary | ICD-10-CM | POA: Insufficient documentation

## 2013-03-22 DIAGNOSIS — E678 Other specified hyperalimentation: Secondary | ICD-10-CM | POA: Insufficient documentation

## 2013-03-22 DIAGNOSIS — I2510 Atherosclerotic heart disease of native coronary artery without angina pectoris: Secondary | ICD-10-CM | POA: Insufficient documentation

## 2013-03-22 DIAGNOSIS — Z951 Presence of aortocoronary bypass graft: Secondary | ICD-10-CM | POA: Insufficient documentation

## 2013-03-22 DIAGNOSIS — J441 Chronic obstructive pulmonary disease with (acute) exacerbation: Secondary | ICD-10-CM | POA: Insufficient documentation

## 2013-03-22 DIAGNOSIS — N189 Chronic kidney disease, unspecified: Secondary | ICD-10-CM | POA: Insufficient documentation

## 2013-03-22 DIAGNOSIS — E119 Type 2 diabetes mellitus without complications: Secondary | ICD-10-CM | POA: Insufficient documentation

## 2013-03-22 DIAGNOSIS — R3129 Other microscopic hematuria: Secondary | ICD-10-CM

## 2013-03-22 DIAGNOSIS — K219 Gastro-esophageal reflux disease without esophagitis: Secondary | ICD-10-CM | POA: Insufficient documentation

## 2013-03-22 DIAGNOSIS — G4730 Sleep apnea, unspecified: Secondary | ICD-10-CM | POA: Insufficient documentation

## 2013-03-22 DIAGNOSIS — Z7982 Long term (current) use of aspirin: Secondary | ICD-10-CM | POA: Insufficient documentation

## 2013-03-22 DIAGNOSIS — I129 Hypertensive chronic kidney disease with stage 1 through stage 4 chronic kidney disease, or unspecified chronic kidney disease: Secondary | ICD-10-CM | POA: Insufficient documentation

## 2013-03-22 DIAGNOSIS — R300 Dysuria: Secondary | ICD-10-CM | POA: Insufficient documentation

## 2013-03-22 DIAGNOSIS — Z4801 Encounter for change or removal of surgical wound dressing: Secondary | ICD-10-CM | POA: Insufficient documentation

## 2013-03-22 LAB — URINALYSIS, ROUTINE W REFLEX MICROSCOPIC
Bilirubin Urine: NEGATIVE
Glucose, UA: NEGATIVE mg/dL
Ketones, ur: NEGATIVE mg/dL
Protein, ur: >300 mg/dL — AB
Urobilinogen, UA: 1 mg/dL (ref 0.0–1.0)

## 2013-03-22 NOTE — ED Notes (Signed)
Pt has nosebleed on Wednesday and had packing placed,now pt needs packing removed. Denies any issues with same.

## 2013-03-22 NOTE — ED Provider Notes (Signed)
Medical screening examination/treatment/procedure(s) were conducted as a shared visit with non-physician practitioner(s) and myself.  I personally evaluated the patient during the encounter and agree with the physical exam and plan of care.  Pt here with prior epistaxis on Plavix and ASA.  Here to have packing removed from right nare. No further bleeding.  Will monitor.  Also with mild dysuria, UA pending. No concern for STI.  Prostate exam is normal. Patient does have left testicular swelling but no pain. He states this has been present for 6 months and he has been seen by his primary care physician and had a full workup including ultrasound which revealed "fluid on the testicle" likely a hydrocele.  11:45 AM  Pt has small amt blood in urine.  No other sign of infection. We'll have him followup with his PCP. No further epistaxis.  Layla Maw Ward, DO 03/22/13 1654

## 2013-03-22 NOTE — ED Provider Notes (Signed)
CSN: [id number]     Arrival date & time 03/22/13  0917 History     First MD Initiated Contact with Patient 03/22/13 [phone]     Chief Complaint  Patient presents with  . Epistaxis   (Consider location/radiation/quality/duration/timing/severity/associated sxs/prior Treatment) HPI  CLEARANCE CHENAULT is a 62 y.o. male presenting for removal of anterior rhinorrhea placed 3 days ago. Patient is anticoagulated with Plavix takes a daily aspirin. He has had no issues with bleeding since the packing was placed. He is reports mild dysuria starting this a.m. He denies any other pain, fever, nausea vomiting, shortness of breath change in bowel or bladder habits.  Past Medical History  Diagnosis Date  . Heart disease, unspecified   . Coronary atherosclerosis of unspecified type of vessel, native or graft   . Chronic pulmonary heart disease, unspecified   . Other hyperalimentation   . Chronic respiratory failure   . Unspecified sleep apnea   . Chronic airway obstruction, not elsewhere classified   . Angina   . Shortness of breath   . Anemia   . Chronic kidney disease   . Anxiety   . Hypertension   . CHF (congestive heart failure)   . Blood transfusion     no reaction from transfusion  . Arthritis     hx of gout  . Diabetes mellitus   . CORONARY ARTERY DISEASE 10/28/2007  . CPAP (continuous positive airway pressure) dependence   . GERD (gastroesophageal reflux disease)    Past Surgical History  Procedure Laterality Date  . Coronary artery bypass graft    . Appendectomy    . Bilateral vats ablation    . Coronary angioplasty with stent placement    . Hemmorhoids    . Lung sx  2005    Growth on outside of right lung  . Esophagogastroduodenoscopy  08/11/2011    Procedure: ESOPHAGOGASTRODUODENOSCOPY (EGD);  Surgeon: Barrie Folk, MD;  Location: [facility] ENDOSCOPY;  Service: Endoscopy;  Laterality: N/A;  . Colonoscopy  08/11/2011    Procedure: COLONOSCOPY;  Surgeon: Barrie Folk, MD;  Location:  [facility] ENDOSCOPY;  Service: Endoscopy;  Laterality: N/A;  . Cataract extraction     Family History  Problem Relation Age of Onset  . Diabetes Sister    History  Substance Use Topics  . Smoking status: Former Smoker -- 1.00 packs/day for 25 years    Types: Cigarettes    Quit date: 08/14/1998  . Smokeless tobacco: Never Used  . Alcohol Use: Yes     Comment: social    Review of Systems 10 systems reviewed and found to be negative, except as noted in the HPI  Allergies  Amlodipine besy-benazepril hcl and Percocet  Home Medications   Current Outpatient Rx  Name  Route  Sig  Dispense  Refill  . albuterol (PROVENTIL HFA;VENTOLIN HFA) 108 (90 BASE) MCG/ACT inhaler   Inhalation   Inhale 2 puffs into the lungs every 6 (six) hours as needed. For wheezing         . albuterol (PROVENTIL) (2.5 MG/3ML) 0.083% nebulizer solution   Nebulization   Take 3 mLs (2.5 mg total) by nebulization every 6 (six) hours.   75 mL   12   . aspirin EC 81 MG tablet   Oral   Take 81 mg by mouth daily.         . budesonide (PULMICORT) 0.25 MG/2ML nebulizer solution   Nebulization   Take 2 mLs (0.25 mg total) by nebulization 2 (two)  times daily. Dx: 496   120 mL   5   . cephALEXin (KEFLEX) 500 MG capsule      2 caps po bid x 3 days   12 capsule   0   . clopidogrel (PLAVIX) 75 MG tablet   Oral   Take 1 tablet (75 mg total) by mouth daily.   30 tablet   11   . COLCRYS 0.6 MG tablet   Oral   Take 0.6 mg by mouth daily.          . furosemide (LASIX) 40 MG tablet   Oral   Take 1 tablet (40 mg total) by mouth 2 (two) times daily. May take an additional 40 mg if he has additional edema.   60 tablet   3   . ipratropium (ATROVENT) 0.02 % nebulizer solution   Nebulization   Take 2.5 mLs (500 mcg total) by nebulization 4 (four) times daily. Dx: 496   300 mL   5   . isosorbide mononitrate (IMDUR) 60 MG 24 hr tablet   Oral   Take 1 tablet (60 mg total) by mouth daily.   90 tablet   3    . metoprolol tartrate (LOPRESSOR) 25 MG tablet   Oral   Take 0.5 tablets (12.5 mg total) by mouth 2 (two) times daily.   30 tablet   10   . Multiple Vitamin (MULITIVITAMIN WITH MINERALS) TABS   Oral   Take 1 tablet by mouth daily.         . nitroGLYCERIN (NITROSTAT) 0.4 MG SL tablet   Sublingual   Place 1 tablet (0.4 mg total) under the tongue every 5 (five) minutes as needed. For chest pain   25 tablet   6   . pantoprazole (PROTONIX) 40 MG tablet   Oral   Take 40 mg by mouth daily.          . rosuvastatin (CRESTOR) 20 MG tablet   Oral   Take 20 mg by mouth daily.          . Tamsulosin HCl (FLOMAX) 0.4 MG CAPS   Oral   Take 0.4 mg by mouth daily.           BP 158/95  Pulse 100  Temp(Src) 97.1 F (36.2 C) (Oral)  Resp 22  SpO2 94% Physical Exam  Nursing note and vitals reviewed. Constitutional: He is oriented to person, place, and time. He appears well-developed and well-nourished. No distress.  HENT:  Head: Normocephalic.  Eyes: Conjunctivae and EOM are normal.  Cardiovascular: Normal rate.   Pulmonary/Chest: Effort normal and breath sounds normal. No stridor. No respiratory distress. He has no wheezes. He has no rales. He exhibits no tenderness.  Abdominal: Soft. Bowel sounds are normal. He exhibits no distension and no mass. There is no tenderness. There is no rebound and no guarding.  Musculoskeletal: Normal range of motion.  Neurological: He is alert and oriented to person, place, and time.  Psychiatric: He has a normal mood and affect.    ED Course   EPISTAXIS MANAGEMENT Date/Time: 03/22/2013 4:30 PM Performed by: Wynetta Emery Authorized by: Wynetta Emery Consent: Verbal consent obtained. Post-procedure assessment: bleeding stopped Comments: Rhino Rocket removed from right Nare. Pt tolerated procedure well, bleeding controlled   (including critical care time)  Labs Reviewed  URINALYSIS, ROUTINE W REFLEX MICROSCOPIC - Abnormal;  Notable for the following:    APPearance CLOUDY (*)    Hgb urine dipstick SMALL (*)  Protein, ur >300 (*)    All other components within normal limits  URINE CULTURE  URINE MICROSCOPIC-ADD ON   No results found. 1. Epistaxis   2. Microscopic hematuria     MDM   Filed Vitals:   03/22/13 0924 03/22/13 1152  BP: 158/95 145/82  Pulse: 100 69  Temp: 97.1 F (36.2 C)   TempSrc: Oral   Resp: 22 20  SpO2: 94% 100%     HAYATO GUAMAN is a 62 y.o. male who presents for removal of Rhino Rocket to right near, [employer] was removed without issue, patient observed in the ED with good hemostasis. Patient reports a dysuria onset this a.m. urinalysis showed mild hematuria with no signs of infection. I have discussed this with the patient and asked him to follow with his primary care for recheck in the next several weeks. Return depressions discussed.  This is a shared visit with the attending physician who personally evaluated the patient and agrees with the care plan.   Pt is hemodynamically stable, appropriate for, and amenable to discharge at this time. Pt verbalized understanding and agrees with care plan. All questions answered. Outpatient follow-up and specific return precautions discussed.    Note: Portions of this report may have been transcribed using voice recognition software. Every effort was made to ensure accuracy; however, inadvertent computerized transcription errors may be present    Wynetta Emery, PA-C 03/22/13 1643

## 2013-03-24 ENCOUNTER — Telehealth: Payer: Self-pay | Admitting: Cardiovascular Disease

## 2013-03-24 LAB — URINE CULTURE
Colony Count: NO GROWTH
Culture: NO GROWTH

## 2013-03-24 NOTE — Telephone Encounter (Signed)
Lab slip is in the [facility] system.  Pt advised

## 2013-03-24 NOTE — Telephone Encounter (Signed)
Patient lost his lab slip. Supposed to have labwork done before he comes back in.

## 2013-03-28 ENCOUNTER — Telehealth: Payer: Self-pay | Admitting: Cardiovascular Disease

## 2013-03-28 DIAGNOSIS — Z79899 Other long term (current) drug therapy: Secondary | ICD-10-CM

## 2013-03-28 LAB — CBC
MCH: 26.9 pg (ref 26.0–34.0)
MCHC: 30 g/dL (ref 30.0–36.0)
RDW: 14.4 % (ref 11.5–15.5)

## 2013-03-28 NOTE — Telephone Encounter (Signed)
Pt called back.  Informed RN was calling to make sure lab was drawn.  Stated he did go.  Pt informed RN will call him back once results received.  Pt verbalized understanding and agreed w/ plan.

## 2013-03-28 NOTE — Telephone Encounter (Signed)
Returned call from [employer], Charity fundraiser.  Left message on confidential voicemail that pt has been contacted and stat CBC ordered.  Will f/u with pt once result received with further instructions.  Call back today before 4pm if needed.

## 2013-03-28 NOTE — Telephone Encounter (Signed)
Lab result not back yet.  Penni Bombard, PA-C notified and advised if lab is critical the PA on-call will be paged and they will contact pt.  Otherwise, will address on Monday.  Call to pt and pt informed.  Pt reminded to STOP taking either the samples or the prescription Plavix.  Pt verbalized understanding and agreed w/ plan.

## 2013-03-28 NOTE — Telephone Encounter (Signed)
RN with [facility] reporting that [name] [name] has been taking Plavix 75 mg and Clopidigril 75 mg per day.  He continues to have swelling in legs and stomach but states he is taking is diuretic each day.  He also reports that he is having intermittent SOB. She is calling to see what needs to be done for this patient.

## 2013-03-28 NOTE — Telephone Encounter (Signed)
Call to pt to make sure he had lab drawn.  Voicemail has not been set up.  Will try again.

## 2013-03-28 NOTE — Telephone Encounter (Signed)
Returned call.  Pt stated he has been taking Plavix samples and Clopidogrel.  Wanted to know if he should be taking both.  Pt informed they are the same medication and he should NOT be taking both.  Stated he didn't know they were the same thing.  Pt informed nurse from his insurance company called about this too today.  Pt c/o weakness/fatigue.  Pt asked to hold while RN discussed w/ PharmD & PA.  Of note, pt seen in ER twice for bleeding since OV w/ Dr. Allyson Sabal on 7.31.14.  Belenda Cruise, PharmD and B. Leron Croak, PA-C notified.  Advised pt d/c one and have CBC stat.    Pt informed and agreed to go to lab within the hour to have labs drawn.  Denied dizziness or lightheadedness when asked.  Stated he is able to drive.  Pt informed RN will call him back once result received and reviewed.  Pt verbalized understanding and agreed w/ plan.

## 2013-03-28 NOTE — Telephone Encounter (Signed)
[name] [name] has questions about his medications  Not sure which prescriptions he should take . Please call..    Thanks

## 2013-03-30 ENCOUNTER — Inpatient Hospital Stay (HOSPITAL_COMMUNITY): Payer: Medicare Other

## 2013-03-30 ENCOUNTER — Emergency Department (HOSPITAL_COMMUNITY): Payer: Medicare Other

## 2013-03-30 ENCOUNTER — Encounter (HOSPITAL_COMMUNITY): Payer: Self-pay | Admitting: *Deleted

## 2013-03-30 ENCOUNTER — Inpatient Hospital Stay (HOSPITAL_COMMUNITY)
Admission: EM | Admit: 2013-03-30 | Discharge: 2013-04-04 | DRG: 208 | Disposition: A | Discharge status: Home Health | Payer: Medicare Other | Attending: Pulmonary Disease | Admitting: Pulmonary Disease

## 2013-03-30 DIAGNOSIS — Z9119 Patient's noncompliance with other medical treatment and regimen: Secondary | ICD-10-CM

## 2013-03-30 DIAGNOSIS — I129 Hypertensive chronic kidney disease with stage 1 through stage 4 chronic kidney disease, or unspecified chronic kidney disease: Secondary | ICD-10-CM | POA: Diagnosis present

## 2013-03-30 DIAGNOSIS — E119 Type 2 diabetes mellitus without complications: Secondary | ICD-10-CM | POA: Diagnosis present

## 2013-03-30 DIAGNOSIS — N179 Acute kidney failure, unspecified: Secondary | ICD-10-CM

## 2013-03-30 DIAGNOSIS — E873 Alkalosis: Secondary | ICD-10-CM

## 2013-03-30 DIAGNOSIS — I5032 Chronic diastolic (congestive) heart failure: Secondary | ICD-10-CM

## 2013-03-30 DIAGNOSIS — J9620 Acute and chronic respiratory failure, unspecified whether with hypoxia or hypercapnia: Principal | ICD-10-CM

## 2013-03-30 DIAGNOSIS — D696 Thrombocytopenia, unspecified: Secondary | ICD-10-CM | POA: Diagnosis present

## 2013-03-30 DIAGNOSIS — E8729 Other acidosis: Secondary | ICD-10-CM

## 2013-03-30 DIAGNOSIS — E785 Hyperlipidemia, unspecified: Secondary | ICD-10-CM | POA: Diagnosis present

## 2013-03-30 DIAGNOSIS — G9340 Encephalopathy, unspecified: Secondary | ICD-10-CM

## 2013-03-30 DIAGNOSIS — I509 Heart failure, unspecified: Secondary | ICD-10-CM | POA: Diagnosis present

## 2013-03-30 DIAGNOSIS — I2510 Atherosclerotic heart disease of native coronary artery without angina pectoris: Secondary | ICD-10-CM

## 2013-03-30 DIAGNOSIS — D638 Anemia in other chronic diseases classified elsewhere: Secondary | ICD-10-CM | POA: Diagnosis present

## 2013-03-30 DIAGNOSIS — I119 Hypertensive heart disease without heart failure: Secondary | ICD-10-CM

## 2013-03-30 DIAGNOSIS — J9610 Chronic respiratory failure, unspecified whether with hypoxia or hypercapnia: Secondary | ICD-10-CM

## 2013-03-30 DIAGNOSIS — E1159 Type 2 diabetes mellitus with other circulatory complications: Secondary | ICD-10-CM

## 2013-03-30 DIAGNOSIS — I739 Peripheral vascular disease, unspecified: Secondary | ICD-10-CM

## 2013-03-30 DIAGNOSIS — E872 Acidosis: Secondary | ICD-10-CM

## 2013-03-30 DIAGNOSIS — Z7902 Long term (current) use of antithrombotics/antiplatelets: Secondary | ICD-10-CM

## 2013-03-30 DIAGNOSIS — D649 Anemia, unspecified: Secondary | ICD-10-CM

## 2013-03-30 DIAGNOSIS — J9601 Acute respiratory failure with hypoxia: Secondary | ICD-10-CM

## 2013-03-30 DIAGNOSIS — K219 Gastro-esophageal reflux disease without esophagitis: Secondary | ICD-10-CM | POA: Diagnosis present

## 2013-03-30 DIAGNOSIS — Z951 Presence of aortocoronary bypass graft: Secondary | ICD-10-CM

## 2013-03-30 DIAGNOSIS — J4489 Other specified chronic obstructive pulmonary disease: Secondary | ICD-10-CM

## 2013-03-30 DIAGNOSIS — N183 Chronic kidney disease, stage 3 unspecified: Secondary | ICD-10-CM

## 2013-03-30 DIAGNOSIS — J441 Chronic obstructive pulmonary disease with (acute) exacerbation: Secondary | ICD-10-CM | POA: Diagnosis present

## 2013-03-30 DIAGNOSIS — Z91199 Patient's noncompliance with other medical treatment and regimen due to unspecified reason: Secondary | ICD-10-CM

## 2013-03-30 DIAGNOSIS — Z6841 Body Mass Index (BMI) 40.0 and over, adult: Secondary | ICD-10-CM

## 2013-03-30 DIAGNOSIS — J449 Chronic obstructive pulmonary disease, unspecified: Secondary | ICD-10-CM

## 2013-03-30 DIAGNOSIS — J9600 Acute respiratory failure, unspecified whether with hypoxia or hypercapnia: Secondary | ICD-10-CM

## 2013-03-30 DIAGNOSIS — E678 Other specified hyperalimentation: Secondary | ICD-10-CM

## 2013-03-30 DIAGNOSIS — Z9981 Dependence on supplemental oxygen: Secondary | ICD-10-CM

## 2013-03-30 DIAGNOSIS — G4733 Obstructive sleep apnea (adult) (pediatric): Secondary | ICD-10-CM

## 2013-03-30 DIAGNOSIS — Z87891 Personal history of nicotine dependence: Secondary | ICD-10-CM

## 2013-03-30 DIAGNOSIS — F411 Generalized anxiety disorder: Secondary | ICD-10-CM | POA: Diagnosis present

## 2013-03-30 DIAGNOSIS — Z79899 Other long term (current) drug therapy: Secondary | ICD-10-CM

## 2013-03-30 DIAGNOSIS — N400 Enlarged prostate without lower urinary tract symptoms: Secondary | ICD-10-CM | POA: Diagnosis present

## 2013-03-30 HISTORY — DX: Obstructive sleep apnea (adult) (pediatric): G47.33

## 2013-03-30 HISTORY — DX: Chronic diastolic (congestive) heart failure: I50.32

## 2013-03-30 HISTORY — DX: Type 2 diabetes mellitus with other circulatory complications: E11.59

## 2013-03-30 HISTORY — DX: Personal history of other diseases of the musculoskeletal system and connective tissue: Z87.39

## 2013-03-30 HISTORY — DX: Peripheral vascular disease, unspecified: I73.9

## 2013-03-30 LAB — URINE MICROSCOPIC-ADD ON

## 2013-03-30 LAB — CBC WITH DIFFERENTIAL/PLATELET
Basophils Absolute: 0 10*3/uL (ref 0.0–0.1)
Eosinophils Absolute: 0.1 10*3/uL (ref 0.0–0.7)
Eosinophils Relative: 1 % (ref 0–5)
Lymphocytes Relative: 12 % (ref 12–46)
Lymphs Abs: 0.6 10*3/uL — ABNORMAL LOW (ref 0.7–4.0)
MCH: 28.1 pg (ref 26.0–34.0)
Neutrophils Relative %: 71 % (ref 43–77)
Platelets: 103 10*3/uL — ABNORMAL LOW (ref 150–400)
RBC: 3.42 MIL/uL — ABNORMAL LOW (ref 4.22–5.81)
RDW: 13.5 % (ref 11.5–15.5)
WBC: 4.9 10*3/uL (ref 4.0–10.5)

## 2013-03-30 LAB — BASIC METABOLIC PANEL WITH GFR
BUN: 26 mg/dL — ABNORMAL HIGH (ref 6–23)
CO2: >45 meq/L (ref 19–32)
Calcium: 9.7 mg/dL (ref 8.4–10.5)
Chloride: 97 meq/L (ref 96–112)
Creatinine, Ser: 1.59 mg/dL — ABNORMAL HIGH (ref 0.50–1.35)
GFR calc Af Amer: 52 mL/min — ABNORMAL LOW
GFR calc non Af Amer: 45 mL/min — ABNORMAL LOW
Glucose, Bld: 107 mg/dL — ABNORMAL HIGH (ref 70–99)
Potassium: 4.7 meq/L (ref 3.5–5.1)
Sodium: 146 meq/L — ABNORMAL HIGH (ref 135–145)

## 2013-03-30 LAB — URINALYSIS, ROUTINE W REFLEX MICROSCOPIC
Bilirubin Urine: NEGATIVE
Nitrite: NEGATIVE
Protein, ur: 30 mg/dL — AB
Specific Gravity, Urine: 1.008 (ref 1.005–1.030)
Urobilinogen, UA: 0.2 mg/dL (ref 0.0–1.0)

## 2013-03-30 LAB — GLUCOSE, CAPILLARY: Glucose-Capillary: 113 mg/dL — ABNORMAL HIGH (ref 70–99)

## 2013-03-30 LAB — TROPONIN I: Troponin I: <0.3 ng/mL (ref <0.30)

## 2013-03-30 LAB — PRO B NATRIURETIC PEPTIDE: Pro B Natriuretic peptide (BNP): 620.4 pg/mL — ABNORMAL HIGH (ref 0–125)

## 2013-03-30 MED ORDER — IPRATROPIUM BROMIDE 0.02 % IN SOLN: 0.5000 mg | RESPIRATORY_TRACT | Status: DC | PRN

## 2013-03-30 MED ORDER — ALBUTEROL SULFATE (5 MG/ML) 0.5% IN NEBU
2.5000 mg | INHALATION_SOLUTION | RESPIRATORY_TRACT | Status: DC | PRN
  Filled 2013-03-30: qty 0.5

## 2013-03-30 MED ORDER — ASPIRIN 81 MG PO CHEW
324.0000 mg | CHEWABLE_TABLET | Freq: Once | ORAL | Status: AC
  Administered 2013-03-30: 324 mg via ORAL
  Filled 2013-03-30: qty 4

## 2013-03-30 MED ORDER — FENTANYL CITRATE 0.05 MG/ML IJ SOLN
50.0000 ug | INTRAMUSCULAR | Status: DC | PRN
  Administered 2013-03-31: 100 ug via INTRAVENOUS
  Filled 2013-03-30: qty 2

## 2013-03-30 MED ORDER — ASPIRIN EC 81 MG PO TBEC
81.0000 mg | DELAYED_RELEASE_TABLET | Freq: Every day | ORAL | Status: DC
  Administered 2013-03-31: 81 mg via ORAL
  Filled 2013-03-30 (×2): qty 1

## 2013-03-30 MED ORDER — CEFTRIAXONE SODIUM 2 G IJ SOLR
2.0000 g | INTRAMUSCULAR | Status: DC
  Administered 2013-03-31: 2 g via INTRAVENOUS
  Filled 2013-03-30 (×2): qty 2

## 2013-03-30 MED ORDER — FENTANYL CITRATE 0.05 MG/ML IJ SOLN
INTRAMUSCULAR | Status: AC
  Filled 2013-03-30: qty 2

## 2013-03-30 MED ORDER — METHYLPREDNISOLONE SODIUM SUCC 125 MG IJ SOLR
125.0000 mg | Freq: Once | INTRAMUSCULAR | Status: AC
  Administered 2013-03-30: 125 mg via INTRAVENOUS
  Filled 2013-03-30: qty 2

## 2013-03-30 MED ORDER — ATORVASTATIN CALCIUM 40 MG PO TABS
40.0000 mg | ORAL_TABLET | Freq: Every day | ORAL | Status: DC
  Administered 2013-03-31: 40 mg via ORAL
  Filled 2013-03-30 (×2): qty 1

## 2013-03-30 MED ORDER — METHYLPREDNISOLONE SODIUM SUCC 125 MG IJ SOLR
80.0000 mg | Freq: Two times a day (BID) | INTRAMUSCULAR | Status: DC
  Administered 2013-03-31 (×2): 80 mg via INTRAVENOUS
  Filled 2013-03-30 (×4): qty 1.28

## 2013-03-30 MED ORDER — SODIUM CHLORIDE 0.9 % IV SOLN
250.0000 mL | INTRAVENOUS | Status: DC | PRN
  Administered 2013-03-31: 250 mL via INTRAVENOUS

## 2013-03-30 MED ORDER — ISOSORBIDE MONONITRATE ER 60 MG PO TB24
60.0000 mg | ORAL_TABLET | Freq: Every day | ORAL | Status: DC
  Administered 2013-03-31: 60 mg via ORAL
  Filled 2013-03-30 (×2): qty 1

## 2013-03-30 MED ORDER — TAMSULOSIN HCL 0.4 MG PO CAPS
0.4000 mg | ORAL_CAPSULE | Freq: Every day | ORAL | Status: DC
  Filled 2013-03-30 (×2): qty 1

## 2013-03-30 MED ORDER — HEPARIN SODIUM (PORCINE) 5000 UNIT/ML IJ SOLN
5000.0000 [IU] | Freq: Three times a day (TID) | INTRAMUSCULAR | Status: DC
  Administered 2013-03-31 – 2013-04-01 (×5): 5000 [IU] via SUBCUTANEOUS
  Filled 2013-03-30 (×8): qty 1

## 2013-03-30 MED ORDER — NITROGLYCERIN 0.4 MG SL SUBL
0.4000 mg | SUBLINGUAL_TABLET | Freq: Once | SUBLINGUAL | Status: DC
  Filled 2013-03-30: qty 25

## 2013-03-30 MED ORDER — ETOMIDATE 2 MG/ML IV SOLN
INTRAVENOUS | Status: DC | PRN
  Administered 2013-03-30: 30 mg via INTRAVENOUS

## 2013-03-30 MED ORDER — DEXTROSE 5 % IV SOLN
500.0000 mg | INTRAVENOUS | Status: DC
  Administered 2013-03-31: 500 mg via INTRAVENOUS
  Filled 2013-03-30 (×3): qty 500

## 2013-03-30 MED ORDER — METOPROLOL TARTRATE 12.5 MG HALF TABLET
12.5000 mg | ORAL_TABLET | Freq: Two times a day (BID) | ORAL | Status: DC
  Administered 2013-03-31: 12.5 mg via ORAL
  Filled 2013-03-30 (×3): qty 1

## 2013-03-30 MED ORDER — ATORVASTATIN CALCIUM 40 MG PO TABS: 40.0000 mg | ORAL_TABLET | Freq: Every day | ORAL | Status: DC

## 2013-03-30 MED ORDER — ETOMIDATE 2 MG/ML IV SOLN
30.0000 mg | Freq: Once | INTRAVENOUS | Status: DC
  Filled 2013-03-30: qty 15

## 2013-03-30 MED ORDER — ETOMIDATE 2 MG/ML IV SOLN: 40.0000 mg | Freq: Once | INTRAVENOUS | Status: DC

## 2013-03-30 MED ORDER — SUCCINYLCHOLINE CHLORIDE 20 MG/ML IJ SOLN
150.0000 mg | Freq: Once | INTRAMUSCULAR | Status: DC
  Filled 2013-03-30: qty 7.5

## 2013-03-30 MED ORDER — FUROSEMIDE 10 MG/ML IJ SOLN
80.0000 mg | Freq: Once | INTRAMUSCULAR | Status: AC
  Administered 2013-03-30: 80 mg via INTRAVENOUS
  Filled 2013-03-30: qty 8

## 2013-03-30 MED ORDER — CLOPIDOGREL BISULFATE 75 MG PO TABS
75.0000 mg | ORAL_TABLET | Freq: Every day | ORAL | Status: DC
  Administered 2013-03-31: 75 mg via ORAL
  Filled 2013-03-30 (×2): qty 1

## 2013-03-30 MED ORDER — FUROSEMIDE 10 MG/ML IJ SOLN
40.0000 mg | Freq: Two times a day (BID) | INTRAMUSCULAR | Status: DC
  Administered 2013-03-30 – 2013-03-31 (×2): 40 mg via INTRAVENOUS
  Filled 2013-03-30 (×4): qty 4

## 2013-03-30 MED ORDER — ALBUTEROL SULFATE (5 MG/ML) 0.5% IN NEBU
2.5000 mg | INHALATION_SOLUTION | RESPIRATORY_TRACT | Status: DC
  Administered 2013-03-31 (×3): 2.5 mg via RESPIRATORY_TRACT
  Filled 2013-03-30 (×4): qty 0.5

## 2013-03-30 MED ORDER — ALBUTEROL SULFATE (5 MG/ML) 0.5% IN NEBU
2.5000 mg | INHALATION_SOLUTION | Freq: Once | RESPIRATORY_TRACT | Status: AC
  Administered 2013-03-30: 2.5 mg via RESPIRATORY_TRACT
  Filled 2013-03-30: qty 0.5

## 2013-03-30 MED ORDER — PANTOPRAZOLE SODIUM 40 MG PO TBEC: 40.0000 mg | DELAYED_RELEASE_TABLET | Freq: Every day | ORAL | Status: DC

## 2013-03-30 MED ORDER — SUCCINYLCHOLINE CHLORIDE 20 MG/ML IJ SOLN
INTRAMUSCULAR | Status: DC | PRN
  Administered 2013-03-30: 150 mg via INTRAVENOUS

## 2013-03-30 MED ORDER — PANTOPRAZOLE SODIUM 40 MG IV SOLR
40.0000 mg | Freq: Every day | INTRAVENOUS | Status: DC
  Administered 2013-03-31: 40 mg via INTRAVENOUS
  Filled 2013-03-30 (×2): qty 40

## 2013-03-30 MED ORDER — IPRATROPIUM BROMIDE 0.02 % IN SOLN
0.5000 mg | RESPIRATORY_TRACT | Status: DC
  Administered 2013-03-31 (×3): 0.5 mg via RESPIRATORY_TRACT
  Filled 2013-03-30 (×3): qty 2.5

## 2013-03-30 MED ORDER — PROPOFOL 10 MG/ML IV EMUL
5.0000 ug/kg/min | INTRAVENOUS | Status: DC
  Administered 2013-03-30: 60 ug/kg/min via INTRAVENOUS
  Administered 2013-03-31: 30 ug/kg/min via INTRAVENOUS
  Administered 2013-03-31 (×4): 60 ug/kg/min via INTRAVENOUS
  Filled 2013-03-30 (×7): qty 100

## 2013-03-30 MED ORDER — MIDAZOLAM HCL 2 MG/2ML IJ SOLN: 2.0000 mg | INTRAMUSCULAR | Status: DC | PRN

## 2013-03-30 MED ORDER — IPRATROPIUM BROMIDE 0.02 % IN SOLN
0.5000 mg | Freq: Once | RESPIRATORY_TRACT | Status: AC
  Administered 2013-03-30: 0.5 mg via RESPIRATORY_TRACT
  Filled 2013-03-30: qty 2.5

## 2013-03-30 MED ORDER — FENTANYL CITRATE 0.05 MG/ML IJ SOLN
100.0000 ug | Freq: Once | INTRAMUSCULAR | Status: AC
  Administered 2013-03-30: 100 ug via INTRAVENOUS

## 2013-03-30 NOTE — ED Provider Notes (Signed)
CSN: [id number]     Arrival date & time 03/30/13  1506 History     First MD Initiated Contact with Patient 03/30/13 1539     Chief Complaint  Patient presents with  . Shortness of Breath   (Consider location/radiation/quality/duration/timing/severity/associated sxs/prior Treatment) Patient is a 62 y.o. male presenting with shortness of breath. The history is provided by the patient and the spouse.  Shortness of Breath Severity:  Severe Onset quality:  Gradual Duration:  1 week Timing:  Constant Progression:  Worsening Chronicity:  Recurrent Ineffective treatments:  Inhaler Associated symptoms: wheezing   Associated symptoms: no abdominal pain, no chest pain, no fever and no vomiting     Past Medical History  Diagnosis Date  . Heart disease, unspecified   . Coronary atherosclerosis of unspecified type of vessel, native or graft   . Chronic pulmonary heart disease, unspecified   . Other hyperalimentation   . Chronic respiratory failure   . Unspecified sleep apnea   . Chronic airway obstruction, not elsewhere classified   . Angina   . Shortness of breath   . Anemia   . Chronic kidney disease   . Anxiety   . Hypertension   . CHF (congestive heart failure)   . Blood transfusion     no reaction from transfusion  . Arthritis     hx of gout  . Diabetes mellitus   . CORONARY ARTERY DISEASE 10/28/2007  . CPAP (continuous positive airway pressure) dependence   . GERD (gastroesophageal reflux disease)    Past Surgical History  Procedure Laterality Date  . Coronary artery bypass graft    . Appendectomy    . Bilateral vats ablation    . Coronary angioplasty with stent placement    . Hemmorhoids    . Lung sx  2005    Growth on outside of right lung  . Esophagogastroduodenoscopy  08/11/2011    Procedure: ESOPHAGOGASTRODUODENOSCOPY (EGD);  Surgeon: Barrie Folk, MD;  Location: [facility] ENDOSCOPY;  Service: Endoscopy;  Laterality: N/A;  . Colonoscopy  08/11/2011    Procedure:  COLONOSCOPY;  Surgeon: Barrie Folk, MD;  Location: [facility] ENDOSCOPY;  Service: Endoscopy;  Laterality: N/A;  . Cataract extraction     Family History  Problem Relation Age of Onset  . Diabetes Sister    History  Substance Use Topics  . Smoking status: Former Smoker -- 1.00 packs/day for 25 years    Types: Cigarettes    Quit date: 08/14/1998  . Smokeless tobacco: Never Used  . Alcohol Use: Yes     Comment: social    Review of Systems  Constitutional: Negative for fever.  Respiratory: Positive for shortness of breath and wheezing.   Cardiovascular: Positive for leg swelling (bilateral). Negative for chest pain.  Gastrointestinal: Negative for vomiting and abdominal pain.  All other systems reviewed and are negative.    Allergies  Amlodipine besy-benazepril hcl and Percocet  Home Medications   Current Outpatient Rx  Name  Route  Sig  Dispense  Refill  . albuterol (PROVENTIL HFA;VENTOLIN HFA) 108 (90 BASE) MCG/ACT inhaler   Inhalation   Inhale 2 puffs into the lungs every 6 (six) hours as needed. For wheezing         . albuterol (PROVENTIL) (2.5 MG/3ML) 0.083% nebulizer solution   Nebulization   Take 3 mLs (2.5 mg total) by nebulization every 6 (six) hours.   75 mL   12   . aspirin EC 81 MG tablet   Oral  Take 81 mg by mouth daily.         . budesonide (PULMICORT) 0.25 MG/2ML nebulizer solution   Nebulization   Take 2 mLs (0.25 mg total) by nebulization 2 (two) times daily. Dx: 496   120 mL   5   . cephALEXin (KEFLEX) 500 MG capsule      2 caps po bid x 3 days   12 capsule   0   . clopidogrel (PLAVIX) 75 MG tablet   Oral   Take 1 tablet (75 mg total) by mouth daily.   30 tablet   11   . COLCRYS 0.6 MG tablet   Oral   Take 0.6 mg by mouth daily.          . furosemide (LASIX) 40 MG tablet   Oral   Take 1 tablet (40 mg total) by mouth 2 (two) times daily. May take an additional 40 mg if he has additional edema.   60 tablet   3   . ipratropium  (ATROVENT) 0.02 % nebulizer solution   Nebulization   Take 2.5 mLs (500 mcg total) by nebulization 4 (four) times daily. Dx: 496   300 mL   5   . isosorbide mononitrate (IMDUR) 60 MG 24 hr tablet   Oral   Take 1 tablet (60 mg total) by mouth daily.   90 tablet   3   . metoprolol tartrate (LOPRESSOR) 25 MG tablet   Oral   Take 0.5 tablets (12.5 mg total) by mouth 2 (two) times daily.   30 tablet   10   . mometasone (NASONEX) 50 MCG/ACT nasal spray   Nasal   Place 2 sprays into the nose as needed (allergies).         . Multiple Vitamin (MULITIVITAMIN WITH MINERALS) TABS   Oral   Take 1 tablet by mouth daily.         . nitroGLYCERIN (NITROSTAT) 0.4 MG SL tablet   Sublingual   Place 1 tablet (0.4 mg total) under the tongue every 5 (five) minutes as needed. For chest pain   25 tablet   6   . pantoprazole (PROTONIX) 40 MG tablet   Oral   Take 40 mg by mouth daily.          . rosuvastatin (CRESTOR) 20 MG tablet   Oral   Take 20 mg by mouth daily.          . Tamsulosin HCl (FLOMAX) 0.4 MG CAPS   Oral   Take 0.4 mg by mouth daily.          Marland Kitchen tiotropium (SPIRIVA) 18 MCG inhalation capsule   Inhalation   Place 18 mcg into inhaler and inhale as needed (shorness of breath).          BP 130/103  Temp(Src) 98.5 F (36.9 C) (Oral)  SpO2 83% Physical Exam  Nursing note and vitals reviewed. Constitutional: He is oriented to person, place, and time. He appears well-developed and well-nourished.  obese  HENT:  Head: Normocephalic and atraumatic.  Right Ear: External ear normal.  Left Ear: External ear normal.  Nose: Nose normal.  Eyes: Right eye exhibits no discharge. Left eye exhibits no discharge.  Neck: Neck supple.  Cardiovascular: Normal rate, regular rhythm, normal heart sounds and intact distal pulses.   Pulmonary/Chest: Tachypnea noted. He has decreased breath sounds. He has wheezes.  Abdominal: Soft. There is no tenderness.  Musculoskeletal: He  exhibits edema (bilateral pitting edema in LE).  Neurological: He is alert and oriented to person, place, and time.  Skin: Skin is warm and dry.    ED Course   INTUBATION Performed by: Audree Camel Authorized by: Pricilla Loveless T Consent: Verbal consent obtained. Consent given by: spouse Indications: respiratory failure and hypercapnia Intubation method: video-assisted Patient status: paralyzed (RSI) Preoxygenation: bipap. Sedatives: etomidate Paralytic: succinylcholine Tube size: 7.5 mm Tube type: cuffed Number of attempts: 2 Cricoid pressure: no Cords visualized: yes Post-procedure assessment: chest rise and CO2 detector Breath sounds: equal Cuff inflated: yes Tube secured with: ETT holder Chest x-ray interpreted by radiologist. Chest x-ray findings: endotracheal tube in appropriate position Patient tolerance: Patient tolerated the procedure well with no immediate complications. Comments: Initially tried with 8-0 tube, unable to pass despite grade 1 visualization. Easily passed with 7.5 on second attempt. No hypoxia between attempts.   (including critical care time)  Labs Reviewed  CBC WITH DIFFERENTIAL - Abnormal; Notable for the following:    RBC 3.42 (*)    Hemoglobin 9.6 (*)    HCT 34.8 (*)    MCV 101.8 (*)    MCHC 27.6 (*)    Platelets 103 (*)    Lymphs Abs 0.6 (*)    Monocytes Relative 15 (*)    All other components within normal limits  BASIC METABOLIC PANEL - Abnormal; Notable for the following:    Sodium 146 (*)    CO2 >45 (*)    Glucose, Bld 107 (*)    BUN 26 (*)    Creatinine, Ser 1.59 (*)    GFR calc non Af Amer 45 (*)    GFR calc Af Amer 52 (*)    All other components within normal limits  PRO B NATRIURETIC PEPTIDE - Abnormal; Notable for the following:    Pro B Natriuretic peptide (BNP) 620.4 (*)    All other components within normal limits    Date: 03/31/2013  Rate: 97  Rhythm: normal sinus rhythm  QRS Axis: indeterminate   Intervals: normal  ST/T Wave abnormalities: nonspecific ST/T changes  Conduction Disutrbances:right bundle branch block  Narrative Interpretation: RBBB without new ischemia  Old EKG Reviewed: unchanged     No results found. 1. Acute respiratory failure with hypoxia   2. Chronic diastolic CHF (congestive heart failure)   3. Coronary atherosclerosis of unspecified type of vessel, native or graft   4. Acute-on-chronic respiratory failure   5. Anemia   6. Chronic renal insufficiency, stage III (moderate)   7. Encephalopathy acute   8. Respiratory acidosis     MDM  62 yo male with progressive dyspnea x 1 week. Initially c/w CHF with his edema in extremities and on CXR. Little change with albuterol. ABG shows resp acidosis, despite patient being arousable and conversing. Tried on BiPAP but patient's status worsened and needed intubation after discussing with wife. Critical care involved, sent to ICU after intubation and post CXR.   Critical care necessary for frequent re-evaluations, ABG interpretation, consultation and critical management.   CRITICAL CARE Performed by: Pricilla Loveless T   Total critical care time: 45 minutes  Critical care time was exclusive of separately billable procedures and treating other patients.  Critical care was necessary to treat or prevent imminent or life-threatening deterioration.  Critical care was time spent personally by me on the following activities: development of treatment plan with patient and/or surrogate as well as nursing, discussions with consultants, evaluation of patient's response to treatment, examination of patient, obtaining history from patient or surrogate, ordering  and performing treatments and interventions, ordering and review of laboratory studies, ordering and review of radiographic studies, pulse oximetry and re-evaluation of patient's condition.   Audree Camel, MD 03/31/13 [phone]

## 2013-03-30 NOTE — Consult Note (Signed)
Cardiology Consult Note  Admit date: 03/30/2013 Name: [name] [name] 62 y.o.  male DOB:  1950/10/08 MRN:  [id number]  Today's date:  03/30/2013  Referring Physician:    [facility]  Primary Physician:   Dr. Viann Shove  Reason for Consultation:    Somnolence and shortness of breath  IMPRESSIONS: 1. Acute hypercarbic respiratory failure 2. Coronary artery disease with previous bypass grafting and previous vein graft disease with stenting 3. Obstructive sleep apnea 4. Chronic kidney disease stage 3 5. Diastolic dysfunction 6. COPD with oxygen dependency 7. Obesity 8. Anemia 9. Thrombocytopenia 10. Peripheral vascular disease 11. History of fibrothorax with decortication  RECOMMENDATION: His BNP is not greatly elevated at this time and he appears to have significant hypercarbic respiratory failure both by history and currently by examination.  He will need to be supported from a pulmonary viewpoint and diuresed as needed. Continue his other cardiac medications at this time.  HISTORY: This 62 year old black male has a history of coronary artery disease with bypass grafting in 2001. He has had multiple procedures to stent vein grafts with the last being in 2011. He'll says underlying COPD, sleep apnea and has had admissions for respiratory failure. He has been inactive at home and does not get out often. He will occasionally use nitroglycerin. He has had increased somnolence over the past week as well as some peripheral edema and presented to the emergency room tonight with somnolence, twitching, and significant lethargy. His BNP level was only 621 he was found to be in severe respiratory failure with acidosis and severe hypercarbia. I was not able to obtain much in the way of a history on him and the history is mainly obtained from the family.  Past Medical History  Diagnosis Date  . Heart disease, unspecified   . Coronary atherosclerosis of unspecified type of  vessel, native or graft   . Chronic pulmonary heart disease, unspecified   . Other hyperalimentation   . Chronic respiratory failure   . Unspecified sleep apnea   . Chronic airway obstruction, not elsewhere classified   . Angina   . Shortness of breath   . Anemia   . Chronic kidney disease   . Anxiety   . Hypertension   . CHF (congestive heart failure)   . Blood transfusion     no reaction from transfusion  . Arthritis     hx of gout  . Diabetes mellitus   . CORONARY ARTERY DISEASE 10/28/2007  . CPAP (continuous positive airway pressure) dependence   . GERD (gastroesophageal reflux disease)       Past Surgical History  Procedure Laterality Date  . Coronary artery bypass graft    . Appendectomy    . Bilateral vats ablation    . Coronary angioplasty with stent placement    . Hemmorhoids    . Lung sx  2005    Growth on outside of right lung  . Esophagogastroduodenoscopy  08/11/2011    Procedure: ESOPHAGOGASTRODUODENOSCOPY (EGD);  Surgeon: Barrie Folk, MD;  Location: [facility] ENDOSCOPY;  Service: Endoscopy;  Laterality: N/A;  . Colonoscopy  08/11/2011    Procedure: COLONOSCOPY;  Surgeon: Barrie Folk, MD;  Location: [facility] ENDOSCOPY;  Service: Endoscopy;  Laterality: N/A;  . Cataract extraction       Allergies:  is allergic to amlodipine besy-benazepril hcl and percocet.   Medications: Prior to Admission medications   Medication Sig Start Date End Date Taking? Authorizing Provider  albuterol (PROVENTIL HFA;VENTOLIN HFA) 108 (  90 BASE) MCG/ACT inhaler Inhale 2 puffs into the lungs every 6 (six) hours as needed. For wheezing   Yes Historical Provider, MD  albuterol (PROVENTIL) (2.5 MG/3ML) 0.083% nebulizer solution Take 3 mLs (2.5 mg total) by nebulization every 6 (six) hours. 01/30/13 01/30/14 Yes Jeanella Craze, NP  aspirin EC 81 MG tablet Take 81 mg by mouth daily.   Yes Historical Provider, MD  budesonide (PULMICORT) 0.25 MG/2ML nebulizer solution Take 2 mLs (0.25 mg total) by  nebulization 2 (two) times daily. Dx: 496 12/02/12  Yes Tammy S Parrett, NP  clopidogrel (PLAVIX) 75 MG tablet Take 1 tablet (75 mg total) by mouth daily. 03/17/13  Yes Runell Gess, MD  COLCRYS 0.6 MG tablet Take 0.6 mg by mouth daily.  01/02/11  Yes Historical Provider, MD  furosemide (LASIX) 40 MG tablet Take 1 tablet (40 mg total) by mouth 2 (two) times daily. May take an additional 40 mg if he has additional edema. 01/30/13  Yes Jeanella Craze, NP  ipratropium (ATROVENT) 0.02 % nebulizer solution Take 2.5 mLs (500 mcg total) by nebulization 4 (four) times daily. Dx: 496 12/02/12  Yes Tammy S Parrett, NP  isosorbide mononitrate (IMDUR) 60 MG 24 hr tablet Take 1 tablet (60 mg total) by mouth daily. 03/13/13  Yes Runell Gess, MD  metoprolol tartrate (LOPRESSOR) 25 MG tablet Take 0.5 tablets (12.5 mg total) by mouth 2 (two) times daily. 02/10/13  Yes Runell Gess, MD  mometasone (NASONEX) 50 MCG/ACT nasal spray Place 2 sprays into the nose as needed (allergies).   Yes Historical Provider, MD  Multiple Vitamin (MULITIVITAMIN WITH MINERALS) TABS Take 1 tablet by mouth daily.   Yes Historical Provider, MD  nitroGLYCERIN (NITROSTAT) 0.4 MG SL tablet Place 1 tablet (0.4 mg total) under the tongue every 5 (five) minutes as needed. For chest pain 01/20/13  Yes Runell Gess, MD  pantoprazole (PROTONIX) 40 MG tablet Take 40 mg by mouth daily.  12/18/10  Yes Historical Provider, MD  rosuvastatin (CRESTOR) 20 MG tablet Take 20 mg by mouth daily.    Yes Historical Provider, MD  Tamsulosin HCl (FLOMAX) 0.4 MG CAPS Take 0.4 mg by mouth daily.  12/16/10  Yes Historical Provider, MD  tiotropium (SPIRIVA) 18 MCG inhalation capsule Place 18 mcg into inhaler and inhale as needed (shorness of breath).   Yes Historical Provider, MD  cephALEXin (KEFLEX) 500 MG capsule 2 caps po bid x 3 days 03/20/13   Hurman Horn, MD    Family History: No family status information on file.    Social History:   reports that he  quit smoking about 14 years ago. His smoking use included Cigarettes. He has a 25 pack-year smoking history. He has never used smokeless tobacco. He reports that  drinks alcohol. He reports that he does not use illicit drugs.   History   Social History Narrative  . No narrative on file    Review of Systems: Not obtainable from patient  Physical Exam: BP 146/59  Pulse 92  Temp(Src) 98.5 F (36.9 C) (Oral)  Resp 24  Ht 6' (1.829 m)  Wt 135 kg (297 lb 9.9 oz)  BMI 40.36 kg/m2  SpO2 100%  General appearance: Somnolent appearing obese black male who was unable to be aroused, severely obese Head: Normocephalic, without obvious abnormality, atraumatic Eyes: Conjunctiva and sclera were injected, EOMI, fundi not examined Neck: no adenopathy, no carotid bruit, supple, symmetrical, trachea midline and Difficult to assess JVD Lungs:  Reduced breath sounds with poor air movement Heart: Regular rhythm, normal S1-S2 no S3 Abdomen: Obese soft and nontender Male genitalia: deferred Extremities: 2-3+ peripheral edema, previous saphenous vein harvesting scars Pulses: Peripheral pulses are diminished Neurologic: Moves all extremities.  Labs: CBC  Recent Labs  03/30/13 1730  WBC 4.9  RBC 3.42*  HGB 9.6*  HCT 34.8*  PLT 103*  MCV 101.8*  MCH 28.1  MCHC 27.6*  RDW 13.5  LYMPHSABS 0.6*  MONOABS 0.7  EOSABS 0.1  BASOSABS 0.0   CMP   Recent Labs  03/30/13 1730  NA 146*  K 4.7  CL 97  CO2 >45*  GLUCOSE 107*  BUN 26*  CREATININE 1.59*  CALCIUM 9.7  GFRNONAA 45*  GFRAA 52*   BNP (last 3 results)  Recent Labs  12/02/12 1015 01/27/13 1210 03/30/13 1730  PROBNP 39.0 216.2* 620.4*   Cardiac Panel (last 3 results)  Recent Labs  03/30/13 1730  TROPONINI <0.30     Radiology: Cardiomegaly with effusions and some interstitial congestion  EKG: Sinus rhythm, right bundle branch block, indeterminate axis  Signed:  W. Ashley Royalty MD [facility]   Cardiology  Consultant  03/30/2013, 9:26 PM

## 2013-03-30 NOTE — ED Notes (Signed)
Hx of copd, having increase in sob over past several days. Wears o2 at home and spo2 83% at triage.

## 2013-03-30 NOTE — Progress Notes (Signed)
eLink Physician-Brief Progress Note Patient Name: [name] [name] DOB: [birth year] MRN: [id number]  Date of Service  03/30/2013   HPI/Events of Note   New consult, hypercarbic failure on BiPAP  eICU Interventions   Bedside MD to see Holding orders placed    Intervention Category Major Interventions: Respiratory failure - evaluation and management  Chandlar Guice 03/30/2013, 9:06 PM

## 2013-03-30 NOTE — ED Notes (Signed)
Unable to obtain PIV. IV team called.

## 2013-03-30 NOTE — ED Notes (Signed)
Patient pulled into Trauma C for Intubation.

## 2013-03-30 NOTE — Progress Notes (Signed)
eLink Physician-Brief Progress Note Patient Name: [name] [name] DOB: [birth year] MRN: [id number]  Date of Service  03/30/2013   HPI/Events of Note   agitated  eICU Interventions  Sedation orders      [name] 03/30/2013, 11:32 PM

## 2013-03-30 NOTE — ED Notes (Signed)
IV attempt x 2 unsuccessful.

## 2013-03-31 ENCOUNTER — Telehealth: Payer: Self-pay | Admitting: Cardiovascular Disease

## 2013-03-31 ENCOUNTER — Inpatient Hospital Stay (HOSPITAL_COMMUNITY): Payer: Medicare Other

## 2013-03-31 DIAGNOSIS — G9340 Encephalopathy, unspecified: Secondary | ICD-10-CM

## 2013-03-31 DIAGNOSIS — I509 Heart failure, unspecified: Secondary | ICD-10-CM

## 2013-03-31 DIAGNOSIS — I5032 Chronic diastolic (congestive) heart failure: Secondary | ICD-10-CM

## 2013-03-31 DIAGNOSIS — E872 Acidosis: Secondary | ICD-10-CM

## 2013-03-31 DIAGNOSIS — I2510 Atherosclerotic heart disease of native coronary artery without angina pectoris: Secondary | ICD-10-CM

## 2013-03-31 DIAGNOSIS — J9600 Acute respiratory failure, unspecified whether with hypoxia or hypercapnia: Secondary | ICD-10-CM

## 2013-03-31 DIAGNOSIS — J9620 Acute and chronic respiratory failure, unspecified whether with hypoxia or hypercapnia: Principal | ICD-10-CM

## 2013-03-31 DIAGNOSIS — D649 Anemia, unspecified: Secondary | ICD-10-CM

## 2013-03-31 LAB — POCT I-STAT 3, ART BLOOD GAS (G3+)
Acid-Base Excess: 27 mmol/L — ABNORMAL HIGH (ref 0.0–2.0)
Bicarbonate: 51.3 mEq/L — ABNORMAL HIGH (ref 20.0–24.0)
O2 Saturation: 83 %
TCO2: >50 mmol/L (ref 0–100)
pCO2 arterial: 51.1 mmHg — ABNORMAL HIGH (ref 35.0–45.0)
pO2, Arterial: 41 mmHg — ABNORMAL LOW (ref 80.0–100.0)

## 2013-03-31 LAB — BASIC METABOLIC PANEL
CO2: 37 mEq/L — ABNORMAL HIGH (ref 19–32)
Chloride: 90 mEq/L — ABNORMAL LOW (ref 96–112)
Glucose, Bld: 238 mg/dL — ABNORMAL HIGH (ref 70–99)
Potassium: 4.9 mEq/L (ref 3.5–5.1)
Sodium: 142 mEq/L (ref 135–145)

## 2013-03-31 LAB — GLUCOSE, CAPILLARY: Glucose-Capillary: 167 mg/dL — ABNORMAL HIGH (ref 70–99)

## 2013-03-31 MED ORDER — CHLORHEXIDINE GLUCONATE 0.12 % MT SOLN
15.0000 mL | Freq: Two times a day (BID) | OROMUCOSAL | Status: DC
  Administered 2013-03-31 – 2013-04-01 (×3): 15 mL via OROMUCOSAL
  Filled 2013-03-31 (×3): qty 15

## 2013-03-31 MED ORDER — ACETAMINOPHEN 160 MG/5ML PO SOLN
650.0000 mg | Freq: Four times a day (QID) | ORAL | Status: DC | PRN
  Administered 2013-03-31 – 2013-04-04 (×3): 650 mg
  Filled 2013-03-31 (×2): qty 20.3
  Filled 2013-03-31: qty 40.6

## 2013-03-31 MED ORDER — CLOPIDOGREL BISULFATE 75 MG PO TABS
75.0000 mg | ORAL_TABLET | Freq: Every day | ORAL | Status: DC
  Administered 2013-04-01: 75 mg
  Filled 2013-03-31: qty 1

## 2013-03-31 MED ORDER — VITAL AF 1.2 CAL PO LIQD
1000.0000 mL | ORAL | Status: DC
  Administered 2013-03-31: 1000 mL
  Filled 2013-03-31 (×5): qty 1000

## 2013-03-31 MED ORDER — ALBUTEROL SULFATE HFA 108 (90 BASE) MCG/ACT IN AERS: 8.0000 | INHALATION_SPRAY | RESPIRATORY_TRACT | Status: DC | PRN

## 2013-03-31 MED ORDER — FREE WATER
200.0000 mL | Freq: Three times a day (TID) | Status: DC
  Administered 2013-03-31 – 2013-04-01 (×3): 200 mL

## 2013-03-31 MED ORDER — ACETAZOLAMIDE SODIUM 500 MG IJ SOLR
500.0000 mg | Freq: Once | INTRAMUSCULAR | Status: AC
  Administered 2013-03-31: 500 mg via INTRAVENOUS
  Filled 2013-03-31 (×2): qty 500

## 2013-03-31 MED ORDER — INSULIN ASPART 100 UNIT/ML ~~LOC~~ SOLN
0.0000 [IU] | SUBCUTANEOUS | Status: DC
  Administered 2013-03-31 – 2013-04-01 (×4): 4 [IU] via SUBCUTANEOUS
  Administered 2013-04-01: 3 [IU] via SUBCUTANEOUS
  Administered 2013-04-01: 4 [IU] via SUBCUTANEOUS

## 2013-03-31 MED ORDER — ASPIRIN 325 MG PO TABS
325.0000 mg | ORAL_TABLET | Freq: Every day | ORAL | Status: DC
  Administered 2013-03-31 – 2013-04-01 (×2): 325 mg
  Filled 2013-03-31 (×3): qty 1

## 2013-03-31 MED ORDER — METOPROLOL TARTRATE 25 MG/10 ML ORAL SUSPENSION
12.5000 mg | Freq: Two times a day (BID) | ORAL | Status: DC
  Administered 2013-03-31 – 2013-04-01 (×2): 12.5 mg
  Filled 2013-03-31 (×4): qty 5

## 2013-03-31 MED ORDER — IPRATROPIUM BROMIDE HFA 17 MCG/ACT IN AERS
8.0000 | INHALATION_SPRAY | Freq: Four times a day (QID) | RESPIRATORY_TRACT | Status: DC
  Administered 2013-03-31 – 2013-04-01 (×4): 8 via RESPIRATORY_TRACT
  Filled 2013-03-31: qty 12.9

## 2013-03-31 MED ORDER — ALBUTEROL SULFATE HFA 108 (90 BASE) MCG/ACT IN AERS
8.0000 | INHALATION_SPRAY | Freq: Four times a day (QID) | RESPIRATORY_TRACT | Status: DC
  Administered 2013-03-31 – 2013-04-01 (×4): 8 via RESPIRATORY_TRACT
  Filled 2013-03-31: qty 6.7

## 2013-03-31 MED ORDER — BIOTENE DRY MOUTH MT LIQD
15.0000 mL | Freq: Four times a day (QID) | OROMUCOSAL | Status: DC
  Administered 2013-03-31 – 2013-04-01 (×7): 15 mL via OROMUCOSAL

## 2013-03-31 MED ORDER — INSULIN ASPART 100 UNIT/ML ~~LOC~~ SOLN
2.0000 [IU] | SUBCUTANEOUS | Status: DC
  Administered 2013-03-31 (×2): 6 [IU] via SUBCUTANEOUS

## 2013-03-31 MED ORDER — PRO-STAT SUGAR FREE PO LIQD
60.0000 mL | Freq: Every day | ORAL | Status: DC
  Administered 2013-03-31 – 2013-04-01 (×4): 60 mL
  Filled 2013-03-31 (×9): qty 60

## 2013-03-31 MED ORDER — FENTANYL CITRATE 0.05 MG/ML IJ SOLN
50.0000 ug | INTRAMUSCULAR | Status: DC | PRN
  Administered 2013-03-31 – 2013-04-01 (×3): 50 ug via INTRAVENOUS
  Filled 2013-03-31 (×3): qty 2

## 2013-03-31 MED ORDER — ATORVASTATIN CALCIUM 40 MG PO TABS
40.0000 mg | ORAL_TABLET | Freq: Every day | ORAL | Status: DC
  Administered 2013-03-31: 40 mg
  Filled 2013-03-31 (×2): qty 1

## 2013-03-31 MED ORDER — PANTOPRAZOLE SODIUM 40 MG PO PACK
40.0000 mg | PACK | Freq: Every day | ORAL | Status: DC
  Administered 2013-03-31: 40 mg
  Filled 2013-03-31 (×2): qty 20

## 2013-03-31 MED ORDER — FOLIC ACID 5 MG/ML IJ SOLN
1.0000 mg | Freq: Every day | INTRAMUSCULAR | Status: DC
  Administered 2013-03-31 – 2013-04-01 (×2): 1 mg via INTRAVENOUS
  Filled 2013-03-31 (×3): qty 0.2

## 2013-03-31 MED ORDER — PROPOFOL 10 MG/ML IV EMUL: 5.0000 ug/kg/min | INTRAVENOUS | Status: DC

## 2013-03-31 MED ORDER — METOPROLOL TARTRATE 12.5 MG HALF TABLET: 12.5000 mg | ORAL_TABLET | Freq: Two times a day (BID) | ORAL | Status: DC

## 2013-03-31 NOTE — Progress Notes (Signed)
INITIAL NUTRITION ASSESSMENT  DOCUMENTATION CODES Per approved criteria  -Morbid Obesity   INTERVENTION: 1. Recommend Vital 1.2 @ increase to goal rate of 25 ml/hr.   2. RD to add 60 ml Pro-stat 5 times daily.  This EN regimen currently provides 1576 kcal, 186 gm protein, and 389 ml free water daily. Kcal from propofol providing additional 322 kcal daily for a total of 1898 kcal.   3. Recommend consider consult nutrition for enteral nutrition management.   NUTRITION DIAGNOSIS: Inadequate oral intake related to inability to eat as evidenced by NPO.   Goal: Enteral nutrition to provide22-25 kcals/kg ideal body weight and 100% of estimated protein needs, based on ASPEN guidelines for permissive underfeeding in critically ill obese individuals. ** currently unable to meet protein needs with kcal restriction and additional kcal from propofol.   Monitor:  Enteral nutrition initiation and tolerance, weight trends, labs, I/O's  Reason for Assessment: VRDF  62 y.o. male  Admitting Dx: VRDF   ASSESSMENT: Pt admitted from home with increased SOB and lethargy, hx of CHF and COPD. Pt was not using his home Bipap PTA for at least 3 days. Pt required intubation after no improvement on Bipap. Has had several doses of lasix this admission, likely at dry weight.  Remains intubated at this time.Vital 1.2 initiated by MD for enteral nutrition, goal rate 20 ml/hr, provides 576 kcal and 36 gm protein. Per BMI pt meets criteria for permissive underfeeding.  Pt currently has an OG tube in place, tip in stomach.   Height: Ht Readings from Last 1 Encounters:  03/30/13 6' (1.829 m)    Weight: Wt Readings from Last 1 Encounters:  03/31/13 295 lb 10.2 oz (134.1 kg)    Ideal Body Weight: 178 lbs   % Ideal Body Weight: 155%  Wt Readings from Last 10 Encounters:  03/31/13 295 lb 10.2 oz (134.1 kg)  03/13/13 302 lb 9.6 oz (137.258 kg)  02/05/13 303 lb (137.44 kg)  01/27/13 356 lb 4.2 oz (161.6 kg)   12/02/12 320 lb 9.6 oz (145.423 kg)  11/17/12 328 lb 14.4 oz (149.188 kg)  11/08/12 327 lb 9.6 oz (148.598 kg)  10/20/12 309 lb 1.4 oz (140.2 kg)  09/17/12 310 lb (140.615 kg)  03/28/12 290 lb (131.543 kg)    Usual Body Weight: 302 lbs   % Usual Body Weight: 98%  BMI:  Body mass index is 40.09 kg/(m^2). Obesity class 3, extreme   Patient is currently intubated on ventilator support.  MV: 13.7 L/min Temp:Temp (24hrs), Avg:100.7 F (38.2 C), Min:98.5 F (36.9 C), Max:101.2 F (38.4 C)  Propofol: 12.2  Ml/hr providing 322 kcal from lipids daily.    Estimated Nutritional Needs: Kcal: 2608 Underfeeding goal: 22-25 kcal/gm ideal body weight- 8413-2440 Protein: >/= 200 gm  Fluid: 1.6-1.8 L   Skin: intact   Diet Order: NPO  EDUCATION NEEDS: -No education needs identified at this time   Intake/Output Summary (Last 24 hours) at 03/31/13 1241 Last data filed at 03/31/13 1200  Gross per 24 hour  Intake 1261.29 ml  Output   2625 ml  Net -1363.71 ml    Last BM: PTA    Labs:   Recent Labs Lab 03/30/13 1730 03/31/13 0414  NA 146* 142  K 4.7 4.9  CL 97 90*  CO2 >45* 37*  BUN 26* 33*  CREATININE 1.59* 1.79*  CALCIUM 9.7 9.7  GLUCOSE 107* 238*    CBG (last 3)   Recent Labs  03/31/13 0348 03/31/13 0740  03/31/13 1152  GLUCAP 227* 207* 196*    Scheduled Meds: . albuterol  8 puff Inhalation Q6H  . antiseptic oral rinse  15 mL Mouth Rinse QID  . aspirin  325 mg Per Tube Daily  . atorvastatin  40 mg Per Tube q1800  . chlorhexidine  15 mL Mouth Rinse BID  . [START ON 04/01/2013] clopidogrel  75 mg Per Tube Daily  . feeding supplement (VITAL AF 1.2 CAL)  1,000 mL Per Tube Q24H  . folic acid  1 mg Intravenous Daily  . free water  200 mL Per Tube Q8H  . heparin subcutaneous  5,000 Units Subcutaneous Q8H  . insulin aspart  0-20 Units Subcutaneous Q4H  . ipratropium  8 puff Inhalation Q6H  . metoprolol tartrate  12.5 mg Per Tube BID  . pantoprazole sodium  40  mg Per Tube Q1200    Continuous Infusions: . propofol 15 mcg/kg/min (03/31/13 1130)    Past Medical History  Diagnosis Date  . Coronary atherosclerosis of unspecified type of vessel, native or graft   . Chronic pulmonary heart disease, unspecified   . Chronic respiratory failure   . Unspecified sleep apnea   . Chronic airway obstruction, not elsewhere classified   . Angina   . Shortness of breath   . Anemia   . Chronic kidney disease   . Anxiety   . Hypertension   . CHF (congestive heart failure)   . GERD (gastroesophageal reflux disease)   . History of gout   . Chronic diastolic CHF (congestive heart failure) 10/28/2007    Hospitalized with diastolic CHF controlled with diuresis and BiPAP 2D April 2012 EF 55-60 with severe LVH and grade 1 diastolic dysfunction    . 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 PVD (peripheral vascular disease) 07/13/2011    S/P Lt SFA PTA Sept 2009, known occluded Rt SFA   . Obstructive sleep apnea 10/28/2007    BiPAP 13/10 with O2 3 L Advanced.        Past Surgical History  Procedure Laterality Date  . Coronary artery bypass graft    . Appendectomy    . Bilateral vats ablation    . Coronary angioplasty with stent placement    . Hemmorhoids    . Lung sx  2005    Growth on outside of right lung  . Esophagogastroduodenoscopy  08/11/2011    Procedure: ESOPHAGOGASTRODUODENOSCOPY (EGD);  Surgeon: Barrie Folk, MD;  Location: [facility] ENDOSCOPY;  Service: Endoscopy;  Laterality: N/A;  . Colonoscopy  08/11/2011    Procedure: COLONOSCOPY;  Surgeon: Barrie Folk, MD;  Location: [facility] ENDOSCOPY;  Service: Endoscopy;  Laterality: N/A;  . Cataract extraction      Clarene Duke RD, LDN Pager [phone] After Hours pager [phone]

## 2013-03-31 NOTE — Progress Notes (Signed)
PULMONARY  / CRITICAL CARE MEDICINE  Name: [name] [name] MRN: [id number] DOB: 1950-11-23    ADMISSION DATE:  03/30/2013  PRIMARY SERVICE: PCCM  CHIEF COMPLAINT:  Hypercarbic respiratory failure.  BRIEF PATIENT DESCRIPTION:  62 years old AA male with complex PMH including obesity, diastolic dysfunction, CAD, post CABG, OSA, COPD with chronic respiratory failure and non compliance with BiPAP at home. He also has CKD stage 3. Presents with worsening hypercarbic respiratory failure. Failed BiPAP in the ED and had to be intubated.  SIGNIFICANT EVENTS / STUDIES:  8/17  Failed BiPAP --> intubated  LINES / TUBES: ETT 8/17 >>   CULTURES: MRSA PCR 8/17 >> NEG Urine 8/17 >>   ANTIBIOTICS: Azithro 8/17 >> 8/18 Ceftrx 8/17 >> 8/18    SUBJECTIVE:  Sedated, intubated, RASS -3. Not F/C  VITAL SIGNS: Temp:  [98.5 F (36.9 C)-101.2 F (38.4 C)] 101.2 F (38.4 C) (08/18 1200) Pulse Rate:  [76-108] 91 (08/18 1200) Resp:  [15-31] 16 (08/18 1200) BP: (88-203)/(45-128) 157/76 mmHg (08/18 1200) SpO2:  [83 %-100 %] 100 % (08/18 1200) FiO2 (%):  [35 %-40 %] 40 % (08/18 1200) Weight:  [134.1 kg (295 lb 10.2 oz)-135 kg (297 lb 9.9 oz)] 134.1 kg (295 lb 10.2 oz) (08/18 0441) HEMODYNAMICS:   VENTILATOR SETTINGS: Vent Mode:  [-] PRVC FiO2 (%):  [35 %-40 %] 40 % Set Rate:  [16 bmp-30 bmp] 16 bmp Vt Set:  [460 mL-600 mL] 600 mL PEEP:  [5 cmH20] 5 cmH20 Plateau Pressure:  [25 cmH20-35 cmH20] 25 cmH20 INTAKE / OUTPUT: Intake/Output     08/17 0701 - 08/18 0700 08/18 0701 - 08/19 0700   I.V. (mL/kg) 491.5 (3.7) 239.8 (1.8)   Other  30   NG/GT 30 200   IV Piggyback 300    Total Intake(mL/kg) 821.5 (6.1) 469.8 (3.5)   Urine (mL/kg/hr) 2400 225 (0.3)   Total Output 2400 225   Net -1578.5 +244.8          PHYSICAL EXAMINATION: General: Sedated, intubated HEENT: WNL Heart: RRR, + syst M Lungs: No wheezes Abdomen: Soft, NT, no HSM, + BS Ext: chronic stasis changes, no edema Neuro: no  focal deficits  LABS:  CBC Recent Labs     03/28/13  1343  03/30/13  1730  WBC  5.1  4.9  HGB  9.0*  9.6*  HCT  30.0*  34.8*  PLT  131*  103*   Coag's No results found for this basename: APTT, INR,  in the last 72 hours BMET Recent Labs     03/30/13  1730  03/31/13  0414  NA  146*  142  K  4.7  4.9  CL  97  90*  CO2  >45*  37*  BUN  26*  33*  CREATININE  1.59*  1.79*  GLUCOSE  107*  238*   Electrolytes Recent Labs     03/30/13  1730  03/31/13  0414  CALCIUM  9.7  9.7   Sepsis Markers No results found for this basename: LACTICACIDVEN, PROCALCITON, O2SATVEN,  in the last 72 hours ABG Recent Labs     03/31/13  0027  PHART  7.610*  PCO2ART  51.1*  PO2ART  41.0*   Liver Enzymes No results found for this basename: AST, ALT, ALKPHOS, BILITOT, ALBUMIN,  in the last 72 hours Cardiac Enzymes Recent Labs     03/30/13  1730  TROPONINI  <0.30  PROBNP  620.4*   Glucose Recent Labs  03/30/13  2240  03/31/13  0348  03/31/13  0740  03/31/13  1152  GLUCAP  113*  227*  207*  196*     CXR: CM, IS prominence   ASSESSMENT / PLAN:  PULMONARY A: Acute on chronic hypercarbic respiratory failure Chronic severe restrictive physiology due to obesity and chronic pleural fibrosis OHS/ OSA, non compliance with BiPAP P:   Cont full vent support - changes made Cont vent bundle Daily SBT as indicated Empiric BDs  CARDIOVASCULAR A:  H/o diastolic dysfunction H/O CAD P:  Cards following Cont home meds (atorvastatin, asa, clopidogrel)  RENAL A:   Acute on chronic renal insuff Acute (due to loop diuresis) on chronic (compensatory) metabolic alkalosis P:   Acetazolamide X 1 Monitor BMET intermittently Correct electrolytes as indicated   GASTROINTESTINAL A:   No issues P:   Begin TFs SUP ordered  HEMATOLOGIC A:   Mild anemia without evidence of acute blood loss Thrombocytopenia of unclear etiology P:  Monitor CBCs  INFECTIOUS A:   No  overt infections P:   Monitor off abx  ENDOCRINE A:   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 CRITICAL CARE:  Critical Care Time devoted to patient care services described in this note is 35 minutes.     Billy Fischer, MD ; [facility] [phone].  After 5:30 PM or weekends, call [phone]

## 2013-03-31 NOTE — Progress Notes (Signed)
Pt. Seen and examined. Agree with the NP/PA-C note as written.  Discussed with Dr. Sung Amabile who knows [name] [name] well and feels this is mostly related to hypercapneic respiratory failure. EF was known recently to be 55-60%, probably has RV dysfunction. Diuresed nicely, but BUN/Cr are rising. Exam notable for MR murmur, legs shows chronic stasis changes but appear diuresed, the abdomen is distended, but soft. He is sedated on propofol and comfortable on the vent.  I agree that this is likely primarily a respiratory process. I would recommend diuresis as needed at this point, but not scheduled. I agree that diamox may help the acid/base situation to hopefully allow extubation. Will likely need increases in his anti-hypertensives, especially when the sedation is weaned.  We are available as needed. Call if we can be of assistance.  Chrystie Nose, MD, [facility] Attending Cardiologist [facility]

## 2013-03-31 NOTE — Progress Notes (Signed)
Subjective: Intubated and sedated.  Objective: Vital signs in last 24 hours: Temp:  [98.5 F (36.9 C)-101 F (38.3 C)] 100.9 F (38.3 C) (08/18 0800) Pulse Rate:  [76-108] 93 (08/18 0802) Resp:  [20-30] 30 (08/18 0802) BP: (88-203)/(45-128) 170/81 mmHg (08/18 0802) SpO2:  [83 %-100 %] 100 % (08/18 0802) FiO2 (%):  [35 %-40 %] 40 % (08/18 0802) Weight:  [295 lb 10.2 oz (134.1 kg)-297 lb 9.9 oz (135 kg)] 295 lb 10.2 oz (134.1 kg) (08/18 0441)    Intake/Output from previous day: 08/17 0701 - 08/18 0700 In: 821.5 [I.V.:491.5; NG/GT:30; IV Piggyback:300] Out: 2400 [Urine:2400] Intake/Output this shift: Total I/O In: 68.6 [I.V.:68.6] Out: -   Medications Current Facility-Administered Medications  Medication Dose Route Frequency Provider Last Rate Last Dose  . 0.9 %  sodium chloride infusion  250 mL Intravenous PRN Curlene Dolphin, MD 20 mL/hr at 03/31/13 0033 250 mL at 03/31/13 0033  . acetaminophen (TYLENOL) solution 650 mg  650 mg Per Tube Q6H PRN Alyson Reedy, MD   650 mg at 03/31/13 0506  . ipratropium (ATROVENT) nebulizer solution 0.5 mg  0.5 mg Nebulization Q4H Konstantin Zubelevitskiy, MD   0.5 mg at 03/31/13 0756   And  . albuterol (PROVENTIL) (5 MG/ML) 0.5% nebulizer solution 2.5 mg  2.5 mg Nebulization Q4H [name], MD   2.5 mg at 03/31/13 0756  . ipratropium (ATROVENT) nebulizer solution 0.5 mg  0.5 mg Nebulization Q2H PRN [name], MD       And  . albuterol (PROVENTIL) (5 MG/ML) 0.5% nebulizer solution 2.5 mg  2.5 mg Nebulization Q2H PRN [name], MD      . antiseptic oral rinse (BIOTENE) solution 15 mL  15 mL Mouth Rinse QID Curlene Dolphin, MD   15 mL at 03/31/13 0400  . aspirin EC tablet 81 mg  81 mg Oral Daily [name], MD   81 mg at 03/31/13 0032  . atorvastatin (LIPITOR) tablet 40 mg  40 mg Oral q1800 Audree Camel, MD   40 mg at 03/31/13 0032  . azithromycin (ZITHROMAX) 500 mg  in dextrose 5 % 250 mL IVPB  500 mg Intravenous Q24H [name], MD   500 mg at 03/31/13 0117  . cefTRIAXone (ROCEPHIN) 2 g in dextrose 5 % 50 mL IVPB  2 g Intravenous Q24H [name], MD   2 g at 03/31/13 0033  . chlorhexidine (PERIDEX) 0.12 % solution 15 mL  15 mL Mouth Rinse BID Curlene Dolphin, MD   15 mL at 03/31/13 0830  . clopidogrel (PLAVIX) tablet 75 mg  75 mg Oral Daily [name], MD   75 mg at 03/31/13 0118  . etomidate (AMIDATE) injection 30 mg  30 mg Intravenous Once Audree Camel, MD      . etomidate (AMIDATE) injection   Intravenous PRN Audree Camel, MD   30 mg at 03/30/13 2133  . fentaNYL (SUBLIMAZE) 0.05 MG/ML injection           . fentaNYL (SUBLIMAZE) injection 50-100 mcg  50-100 mcg Intravenous Q2H PRN Alyson Reedy, MD   100 mcg at 03/31/13 0154  . furosemide (LASIX) injection 40 mg  40 mg Intravenous Q12H [name], MD   40 mg at 03/31/13 0832  . heparin injection 5,000 Units  5,000 Units Subcutaneous Q8H [name], MD   5,000 Units at 03/31/13 0500  . insulin aspart (novoLOG) injection 2-6 Units  2-6 Units Subcutaneous Q4H Hessie Diener  Wyvonna Plum, MD   6 Units at 03/31/13 [phone]  . isosorbide mononitrate (IMDUR) 24 hr tablet 60 mg  60 mg Oral Daily [name], MD   60 mg at 03/31/13 0032  . methylPREDNISolone sodium succinate (SOLU-MEDROL) 125 mg/2 mL injection 80 mg  80 mg Intravenous Q12H [name], MD   80 mg at 03/31/13 0830  . metoprolol tartrate (LOPRESSOR) tablet 12.5 mg  12.5 mg Oral BID [name], MD   12.5 mg at 03/31/13 0143  . midazolam (VERSED) injection 2-4 mg  2-4 mg Intravenous Q2H PRN Alyson Reedy, MD      . pantoprazole (PROTONIX) injection 40 mg  40 mg Intravenous QHS Curlene Dolphin, MD   40 mg at 03/31/13 0039  . propofol (DIPRIVAN) 10 mg/ml infusion  5-70 mcg/kg/min (Order-Specific) Intravenous Titrated Audree Camel, MD  48.6 mL/hr at 03/31/13 0652 60 mcg/kg/min at 03/31/13 0652  . succinylcholine (ANECTINE) injection 150 mg  150 mg Intravenous Once Audree Camel, MD      . succinylcholine (ANECTINE) injection   Intravenous PRN Audree Camel, MD   150 mg at 03/30/13 2137  . tamsulosin (FLOMAX) capsule 0.4 mg  0.4 mg Oral Daily [name], MD       Facility-Administered Medications Ordered in Other Encounters  Medication Dose Route Frequency Provider Last Rate Last Dose  . darbepoetin alfa-polysorbate (ARANESP) injection 40 mcg  40 mcg Subcutaneous Q14 Days Irena Cords, MD   40 mcg at 06/19/11 1345    PE: General appearance: Intubated and sedated. Lungs: + Rhonchi bilaterally Heart: regular rate and rhythm, S1, S2 normal, no murmur, click, rub or gallop Extremities: Trace LEE.  > on the right. Pulses: 2+ and symmetric 1+ DP's Skin: Warm and dry Neurologic: Intubated and sedated  Lab Results:   Recent Labs  03/28/13 1343 03/30/13 1730  WBC 5.1 4.9  HGB 9.0* 9.6*  HCT 30.0* 34.8*  PLT 131* 103*   BMET  Recent Labs  03/30/13 1730 03/31/13 0414  NA 146* 142  K 4.7 4.9  CL 97 90*  CO2 >45* 37*  GLUCOSE 107* 238*  BUN 26* 33*  CREATININE 1.59* 1.79*  CALCIUM 9.7 9.7   PORTABLE CHEST - 1 VIEW  Comparison: Chest 03/30/2013 and 02/05/2013.  Findings: Endotracheal tube remains in place in good position. A new NG tube courses into the stomach and below the inferior margin of the film. There is cardiomegaly and mild interstitial edema. Subsegmental atelectasis and small bilateral pleural effusions are noted.  IMPRESSION:  1. Mild interstitial edema with small effusions and basilar atelectasis. 2. Support apparatus projects in good position.  Assessment/Plan   Active Problems:   CORONARY ARTERY DISEASE, CABG 2001, last PCI 7/11   Chronic diastolic CHF (congestive heart failure)   Encephalopathy acute   Acute-on-chronic respiratory failure   Morbid  obesity   Plan:  Net fluids:  -1.6L.  Lasix at 40mg  IV BID.  He has already received a total of 160mg  since yesterday.  SCr has increased from 1.59 to 1.79.  I do not think he needs any more IV lasix for now and I would hold any lasix for tonight. BNP was mildly elevated and by CXR this morning, he does not look to be in overt CHF.  His last 2D echo in March showed and EF of 55-60% and grade one diastolic dysfunction.  Likely for right heart related and that was poorly visualized on the echo in March. Temp 100.9 with  WBCs yesterday WNL.   BP labile 80-170's     LOS: 1 day    Kerman Pfost 03/31/2013 8:42 AM

## 2013-03-31 NOTE — Telephone Encounter (Signed)
Returned call to [employer], RN with [facility].  [name] [name] asked if lab was ordered when pt came in. Informed pt did not come in, but called shortly after she called last week and PA notified and ordered lab.  Informed result did not return before closing and on-call PA/MD must have been notified.  Verbalized understanding and stated pt had been admitted.  Informed RN is aware.

## 2013-03-31 NOTE — Telephone Encounter (Signed)
Amber melton with [employer] calling to follow up on pt  Said saw that pt back in hosp  Has some questions regarding medication  Please call

## 2013-03-31 NOTE — Telephone Encounter (Signed)
Pt admitted to hospital.  Message forwarded to Dr. Allyson Sabal for review.

## 2013-03-31 NOTE — Care Management Note (Addendum)
    Page 1 of 1   04/04/2013     10:27:29 AM   CARE MANAGEMENT NOTE 04/04/2013  Patient:  [name] [name], [name] [name]   Account Number:  [id number]  Date Initiated:  03/31/2013  Documentation initiated by:  Junius Creamer  Subjective/Objective Assessment:   adm w resp failure, vent     Action/Plan:   lives w wife, pcp dr Alwyn Pea  pt eval- no pt f/u   Anticipated DC Date:  04/05/2013   Anticipated DC Plan:  HOME W HOME HEALTH SERVICES      DC Planning Services  CM consult      Choice offered to / List presented to:             Status of service:  Completed, signed off Medicare Important Message given?   (If response is "NO", the following Medicare IM given date fields will be blank) Date Medicare IM given:   Date Additional Medicare IM given:    Discharge Disposition:  HOME/SELF CARE  Per UR Regulation:  Reviewed for med. necessity/level of care/duration of stay  If discussed at Long Length of Stay Meetings, dates discussed:    Comments:  04/04/13 8:48 Letha Cape RN, BSN [phone] patient for dc today, per physical therapy recs no pt f/u, patient states he does not need a HHRN , so [facility]. .  04/03/13 16:21 Letha Cape RN, BSN [phone] patient lives with spouse, await pt eval.  NCM will continue to follow for dc needs.

## 2013-03-31 NOTE — H&P (Signed)
PULMONARY  / CRITICAL CARE MEDICINE  Name: [name] [name] MRN: [id number] DOB: 01-05-1951    ADMISSION DATE:  03/30/2013  PRIMARY SERVICE: PCCM  CHIEF COMPLAINT:  Hypercarbic respiratory failure.  BRIEF PATIENT DESCRIPTION:  62 years old AA male with complex PMH including obesity, diastolic dysfunction, CAD, post CABG, OSA, COPD with chronic respiratory failure and non compliance with BiPAP at home. He also has CKD stage 3. Presents with worsening hypercarbic respiratory failure. Failed BiPAP in the ED and had to be intubated.  SIGNIFICANT EVENTS / STUDIES:  03/30/13 Failed BiPAP --> intubated  LINES / TUBES: - Periphela IV's  CULTURES: - No cultures  ANTIBIOTICS: - Rocephin - Azithromycin  HISTORY OF PRESENT ILLNESS:   62 years old AA male with complex PMH including obesity, diastolic dysfunction, CAD, post CABG, OSA, COPD with chronic respiratory failure and non compliance with BiPAP at home. He also has CKD stage 3. Presents with worsening hypercarbic respiratory failure. As per his wife, he has not been using his BiPAP at home, for the last three days he has been more lethargic and SOB. Denied recent CP, fever, increased cough or sputum production. At admission to the ED his ABG was 7.13/>130/75/47 with no improvement after one hr of BiPAP. We decided to proceed with intubation.   PAST MEDICAL HISTORY :  Past Medical History  Diagnosis Date  . Coronary atherosclerosis of unspecified type of vessel, native or graft   . Chronic pulmonary heart disease, unspecified   . Chronic respiratory failure   . Unspecified sleep apnea   . Chronic airway obstruction, not elsewhere classified   . Angina   . Shortness of breath   . Anemia   . Chronic kidney disease   . Anxiety   . Hypertension   . CHF (congestive heart failure)   . GERD (gastroesophageal reflux disease)   . History of gout   . Chronic diastolic CHF (congestive heart failure) 10/28/2007    Hospitalized with diastolic  CHF controlled with diuresis and BiPAP 2D April 2012 EF 55-60 with severe LVH and grade 1 diastolic dysfunction    . 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 PVD (peripheral vascular disease) 07/13/2011    S/P Lt SFA PTA Sept 2009, known occluded Rt SFA   . Obstructive sleep apnea 10/28/2007    BiPAP 13/10 with O2 3 L Advanced.       Past Surgical History  Procedure Laterality Date  . Coronary artery bypass graft    . Appendectomy    . Bilateral vats ablation    . Coronary angioplasty with stent placement    . Hemmorhoids    . Lung sx  2005    Growth on outside of right lung  . Esophagogastroduodenoscopy  08/11/2011    Procedure: ESOPHAGOGASTRODUODENOSCOPY (EGD);  Surgeon: Barrie Folk, MD;  Location: [facility] ENDOSCOPY;  Service: Endoscopy;  Laterality: N/A;  . Colonoscopy  08/11/2011    Procedure: COLONOSCOPY;  Surgeon: Barrie Folk, MD;  Location[facility] ENDOSCOPY;  Service: Endoscopy;  Laterality: N/A;  . Cataract extraction     Prior to Admission medications   Medication Sig Start Date End Date Taking? Authorizing Provider  albuterol (PROVENTIL HFA;VENTOLIN HFA) 108 (90 BASE) MCG/ACT inhaler Inhale 2 puffs into the lungs every 6 (six) hours as needed. For wheezing   Yes Historical Provider, MD  albuterol (PROVENTIL) (2.5 MG/3ML) 0.083% nebulizer solution Take 3 mLs (2.5 mg total) by nebulization every 6 (  six) hours. 01/30/13 01/30/14 Yes Jeanella Craze, NP  aspirin EC 81 MG tablet Take 81 mg by mouth daily.   Yes Historical Provider, MD  budesonide (PULMICORT) 0.25 MG/2ML nebulizer solution Take 2 mLs (0.25 mg total) by nebulization 2 (two) times daily. Dx: 496 12/02/12  Yes Tammy S Parrett, NP  clopidogrel (PLAVIX) 75 MG tablet Take 1 tablet (75 mg total) by mouth daily. 03/17/13  Yes Runell Gess, MD  COLCRYS 0.6 MG tablet Take 0.6 mg by mouth daily.  01/02/11  Yes Historical Provider, MD  furosemide (LASIX) 40 MG tablet Take 1 tablet (40 mg total) by mouth 2  (two) times daily. May take an additional 40 mg if he has additional edema. 01/30/13  Yes Jeanella Craze, NP  ipratropium (ATROVENT) 0.02 % nebulizer solution Take 2.5 mLs (500 mcg total) by nebulization 4 (four) times daily. Dx: 496 12/02/12  Yes Tammy S Parrett, NP  isosorbide mononitrate (IMDUR) 60 MG 24 hr tablet Take 1 tablet (60 mg total) by mouth daily. 03/13/13  Yes Runell Gess, MD  metoprolol tartrate (LOPRESSOR) 25 MG tablet Take 0.5 tablets (12.5 mg total) by mouth 2 (two) times daily. 02/10/13  Yes Runell Gess, MD  mometasone (NASONEX) 50 MCG/ACT nasal spray Place 2 sprays into the nose as needed (allergies).   Yes Historical Provider, MD  Multiple Vitamin (MULITIVITAMIN WITH MINERALS) TABS Take 1 tablet by mouth daily.   Yes Historical Provider, MD  nitroGLYCERIN (NITROSTAT) 0.4 MG SL tablet Place 1 tablet (0.4 mg total) under the tongue every 5 (five) minutes as needed. For chest pain 01/20/13  Yes Runell Gess, MD  pantoprazole (PROTONIX) 40 MG tablet Take 40 mg by mouth daily.  12/18/10  Yes Historical Provider, MD  rosuvastatin (CRESTOR) 20 MG tablet Take 20 mg by mouth daily.    Yes Historical Provider, MD  Tamsulosin HCl (FLOMAX) 0.4 MG CAPS Take 0.4 mg by mouth daily.  12/16/10  Yes Historical Provider, MD  tiotropium (SPIRIVA) 18 MCG inhalation capsule Place 18 mcg into inhaler and inhale as needed (shorness of breath).   Yes Historical Provider, MD  cephALEXin (KEFLEX) 500 MG capsule 2 caps po bid x 3 days 03/20/13   Hurman Horn, MD   Allergies  Allergen Reactions  . Amlodipine Besy-Benazepril Hcl Swelling    Lips swell  . Percocet [Oxycodone-Acetaminophen] Other (See Comments)    hallucinations    FAMILY HISTORY:  Family History  Problem Relation Age of Onset  . Diabetes Sister    SOCIAL HISTORY:  reports that he quit smoking about 14 years ago. His smoking use included Cigarettes. He has a 25 pack-year smoking history. He has never used smokeless tobacco. He  reports that  drinks alcohol. He reports that he does not use illicit drugs.  REVIEW OF SYSTEMS:  Unable to provide.  SUBJECTIVE:   VITAL SIGNS: Temp:  [98.5 F (36.9 C)] 98.5 F (36.9 C) (08/17 1511) Pulse Rate:  [76-108] 108 (08/18 0115) Resp:  [20-30] 30 (08/18 0115) BP: (113-203)/(45-128) 113/65 mmHg (08/18 0115) SpO2:  [83 %-100 %] 99 % (08/18 0115) FiO2 (%):  [35 %-40 %] 40 % (08/18 0015) Weight:  [297 lb 9.9 oz (135 kg)] 297 lb 9.9 oz (135 kg) (08/17 2100) HEMODYNAMICS:   VENTILATOR SETTINGS: Vent Mode:  [-] PRVC FiO2 (%):  [35 %-40 %] 40 % Set Rate:  [30 bmp] 30 bmp Vt Set:  [460 mL] 460 mL PEEP:  [5 cmH20] 5 cmH20  Plateau Pressure:  [32 cmH20-35 cmH20] 35 cmH20 INTAKE / OUTPUT: Intake/Output     08/17 0701 - 08/18 0700   I.V. (mL/kg) 11.3 (0.1)   NG/GT 30   Total Intake(mL/kg) 41.3 (0.3)   Urine (mL/kg/hr) 1300   Total Output 1300   Net -1258.7         PHYSICAL EXAMINATION: General: Lethargic, minimally responsive, on BiPAP Eyes: Anicteric sclerae. ENT: Oropharynx clear. Dry mucous membranes. No thrush Lymph: No cervical, supraclavicular, or axillary lymphadenopathy. Heart: Normal S1, S2. No murmurs, rubs, or gallops appreciated. No bruits, equal pulses. Lungs: Diminished breath sounds bilaterally. No wheezing, crackles. Abdomen: Abdomen soft, non-tender and not distended, normoactive bowel sounds. No hepatosplenomegaly or masses. Musculoskeletal: No clubbing or synovitis. 2 to 3 + LE edema. Skin: No rashes or lesions Neuro: Lethargic but no focal neurologic deficits.  LABS:  CBC Recent Labs     03/28/13  1343  03/30/13  1730  WBC  5.1  4.9  HGB  9.0*  9.6*  HCT  30.0*  34.8*  PLT  131*  103*   Coag's No results found for this basename: APTT, INR,  in the last 72 hours BMET Recent Labs     03/30/13  1730  NA  146*  K  4.7  CL  97  CO2  >45*  BUN  26*  CREATININE  1.59*  GLUCOSE  107*   Electrolytes Recent Labs     03/30/13  1730   CALCIUM  9.7   Sepsis Markers No results found for this basename: LACTICACIDVEN, PROCALCITON, O2SATVEN,  in the last 72 hours ABG No results found for this basename: PHART, PCO2ART, PO2ART,  in the last 72 hours Liver Enzymes No results found for this basename: AST, ALT, ALKPHOS, BILITOT, ALBUMIN,  in the last 72 hours Cardiac Enzymes Recent Labs     03/30/13  1730  TROPONINI  <0.30  PROBNP  620.4*   Glucose Recent Labs     03/30/13  2240  GLUCAP  113*    Imaging Dg Chest Portable 1 View  03/30/2013   *RADIOLOGY REPORT*  Clinical Data: Shortness of breath  PORTABLE CHEST - 1 VIEW  Comparison: Study obtained earlier in the day  Findings: There is now an endotracheal tube with tip 5.5 cm above the carina.  No pneumothorax.  There is cardiomegaly with patchy consolidation in the bases.  There is mild bibasilar edema.  There are small effusions.  The patient is status post coronary bypass grafting.  IMPRESSION: Endotracheal tube as described.  No pneumothorax.  Evidence of a degree of congestive heart failure.  Suspect bibasilar pneumonia as well.   Original Report Authenticated By: Bretta Bang, M.D.   Dg Chest Port 1 View  03/30/2013   *RADIOLOGY REPORT*  Clinical Data: Dyspnea  PORTABLE CHEST - 1 VIEW  Comparison: February 05, 2013  Findings: There is cardiomegaly with effusions and moderate interstitial edema bilaterally.  There is patchy airspace consolidation in the bases as well.  There is pulmonary venous hypertension.  The patient is status post coronary artery bypass grafting.  IMPRESSION: Congestive heart failure.  Cannot exclude superimposed pneumonia in the bases.   Original Report Authenticated By: Bretta Bang, M.D.     CXR:  Findings: There is cardiomegaly with effusions and moderate  interstitial edema bilaterally. There is patchy airspace  consolidation in the bases as well. There is pulmonary venous  hypertension. The patient is status post coronary artery  bypass  grafting.  IMPRESSION:  Congestive heart failure. Cannot exclude superimposed pneumonia in  the bases.   ASSESSMENT / PLAN:  PULMONARY A: 1) Acute on chronic hypercarbic respiratory failure 2) COPD exacerbation 3) OSA, non compliance with BiPAP P:   - Will admit to the MICU - Mechanical ventilation   - PRVC, Vt: 8cc/kg, PEEP: 5, RR: 16, FiO2: 100% and adjust to keep O2 sat > 94% - VAP order set. - Solumedrol BID  - Albuterol / ipratropium scheduled - Rocephin / azithromycin  - Daily awakening and SBT - Will likely need to be extubated to BiPAP  CARDIOVASCULAR A:  1) History of diastolic dysfunction. LVEF 55 to 60%, Right side of the heart poorly visualized. 2) CAD P:  - Lasix 40 mg IV BID - Cardiology input appreciated - Continue Plavix, aspirin, metoprolol  RENAL A:   1) CKD stage 3 P:   - will follow BMP  GASTROINTESTINAL A:   1) No issues P:   - GI prophylaxis with protonix IV  HEMATOLOGIC A:   1) Anemia, unclear etiology, will need more workup P:  - No need for transfusion of PRBC's - Occult blood in stools   INFECTIOUS A:   1) Acute COPD exacerbation P:   - Rocephin / Azithromycin  ENDOCRINE A:   1) DM2  P:   - Novolog sliding scale per ICU protocol  NEUROLOGIC A:   - Intubated, sedated P:   - Continuous sedation with propofol drip.    I have personally obtained a history, examined the patient, evaluated laboratory and imaging results, formulated the assessment and plan and placed orders. CRITICAL CARE:  Critical Care Time devoted to patient care services described in this note is 45 minutes.   Overton Mam, MD Pulmonary and Critical Care Medicine [facility] Pager: [phone]  03/31/2013, 1:24 AM

## 2013-04-01 ENCOUNTER — Inpatient Hospital Stay (HOSPITAL_COMMUNITY): Payer: Medicare Other

## 2013-04-01 DIAGNOSIS — N179 Acute kidney failure, unspecified: Secondary | ICD-10-CM | POA: Diagnosis present

## 2013-04-01 LAB — CBC
HCT: 34 % — ABNORMAL LOW (ref 39.0–52.0)
Hemoglobin: 9.4 g/dL — ABNORMAL LOW (ref 13.0–17.0)
MCV: 100.6 fL — ABNORMAL HIGH (ref 78.0–100.0)
RDW: 13 % (ref 11.5–15.5)
WBC: 9.9 10*3/uL (ref 4.0–10.5)

## 2013-04-01 LAB — BASIC METABOLIC PANEL
BUN: 61 mg/dL — ABNORMAL HIGH (ref 6–23)
CO2: 42 mEq/L (ref 19–32)
Chloride: 97 mEq/L (ref 96–112)
Creatinine, Ser: 2.43 mg/dL — ABNORMAL HIGH (ref 0.50–1.35)
Potassium: 4.3 mEq/L (ref 3.5–5.1)

## 2013-04-01 LAB — URINE CULTURE: Colony Count: NO GROWTH

## 2013-04-01 LAB — GLUCOSE, CAPILLARY: Glucose-Capillary: 168 mg/dL — ABNORMAL HIGH (ref 70–99)

## 2013-04-01 MED ORDER — IPRATROPIUM BROMIDE 0.02 % IN SOLN
0.5000 mg | Freq: Four times a day (QID) | RESPIRATORY_TRACT | Status: DC
  Administered 2013-04-01 – 2013-04-04 (×9): 0.5 mg via RESPIRATORY_TRACT
  Filled 2013-04-01 (×10): qty 2.5

## 2013-04-01 MED ORDER — ALBUTEROL SULFATE (5 MG/ML) 0.5% IN NEBU
2.5000 mg | INHALATION_SOLUTION | Freq: Four times a day (QID) | RESPIRATORY_TRACT | Status: DC
  Administered 2013-04-01 – 2013-04-04 (×10): 2.5 mg via RESPIRATORY_TRACT
  Filled 2013-04-01 (×11): qty 0.5

## 2013-04-01 MED ORDER — PANTOPRAZOLE SODIUM 40 MG PO TBEC
40.0000 mg | DELAYED_RELEASE_TABLET | Freq: Every day | ORAL | Status: DC
  Administered 2013-04-02 – 2013-04-04 (×3): 40 mg via ORAL
  Filled 2013-04-01 (×3): qty 1

## 2013-04-01 MED ORDER — ENOXAPARIN SODIUM 40 MG/0.4ML ~~LOC~~ SOLN
40.0000 mg | SUBCUTANEOUS | Status: DC
  Administered 2013-04-02 – 2013-04-04 (×3): 40 mg via SUBCUTANEOUS
  Filled 2013-04-01 (×5): qty 0.4

## 2013-04-01 MED ORDER — CLOPIDOGREL BISULFATE 75 MG PO TABS
75.0000 mg | ORAL_TABLET | Freq: Every day | ORAL | Status: DC
  Administered 2013-04-02 – 2013-04-04 (×3): 75 mg via ORAL
  Filled 2013-04-01 (×3): qty 1

## 2013-04-01 MED ORDER — ATORVASTATIN CALCIUM 40 MG PO TABS
40.0000 mg | ORAL_TABLET | Freq: Every day | ORAL | Status: DC
  Administered 2013-04-01 – 2013-04-03 (×3): 40 mg via ORAL
  Filled 2013-04-01 (×5): qty 1

## 2013-04-01 MED ORDER — INSULIN ASPART 100 UNIT/ML ~~LOC~~ SOLN
0.0000 [IU] | Freq: Three times a day (TID) | SUBCUTANEOUS | Status: DC
  Administered 2013-04-01 (×2): 2 [IU] via SUBCUTANEOUS
  Administered 2013-04-02 – 2013-04-03 (×3): 3 [IU] via SUBCUTANEOUS

## 2013-04-01 MED ORDER — ALBUTEROL SULFATE (5 MG/ML) 0.5% IN NEBU
2.5000 mg | INHALATION_SOLUTION | RESPIRATORY_TRACT | Status: DC | PRN
  Filled 2013-04-01: qty 0.5

## 2013-04-01 MED ORDER — METOPROLOL TARTRATE 12.5 MG HALF TABLET
12.5000 mg | ORAL_TABLET | Freq: Two times a day (BID) | ORAL | Status: DC
  Administered 2013-04-01 – 2013-04-04 (×6): 12.5 mg via ORAL
  Filled 2013-04-01 (×7): qty 1

## 2013-04-01 MED ORDER — ASPIRIN 325 MG PO TABS
325.0000 mg | ORAL_TABLET | Freq: Every day | ORAL | Status: DC
  Administered 2013-04-02 – 2013-04-04 (×3): 325 mg via ORAL
  Filled 2013-04-01 (×3): qty 1

## 2013-04-01 MED ORDER — INSULIN ASPART 100 UNIT/ML ~~LOC~~ SOLN: 0.0000 [IU] | Freq: Every day | SUBCUTANEOUS | Status: DC

## 2013-04-01 MED ORDER — BIOTENE DRY MOUTH MT LIQD
15.0000 mL | Freq: Two times a day (BID) | OROMUCOSAL | Status: DC
  Administered 2013-04-01 – 2013-04-04 (×5): 15 mL via OROMUCOSAL

## 2013-04-01 NOTE — Procedures (Signed)
Extubation Procedure Note  Patient Details:   Name: [name] [name] DOB: [birth year] MRN: [id number]   Airway Documentation:     Evaluation  O2 sats: stable throughout Complications: No apparent complications Patient did tolerate procedure well. Bilateral Breath Sounds: Clear;Diminished Suctioning: Airway Yes Patient positive for cuff leak. Extubated to 3 Lpm nasal cannula. No signs of stridor or dyspnea. Patient resting comfortably.   Ancil Boozer 04/01/2013, 10:54 AM

## 2013-04-01 NOTE — Progress Notes (Signed)
CRITICAL VALUE ALERT  Critical value received:  CO2  Date of notification: 04/01/2013   Time of notification:  0552  Critical value read back:  yes  Nurse who received alert:  Crist Fat, RN  MD notified (1st page):  Molli Knock  Time of first page:  0552  MD notified (2nd page):    Time of second page:  Responding MD:  Molli Knock  Time MD responded:  [phone]

## 2013-04-01 NOTE — Progress Notes (Signed)
PULMONARY  / CRITICAL CARE MEDICINE  Name: [name] [name] MRN: [id number] DOB: 1951/03/21    ADMISSION DATE:  03/30/2013  PRIMARY SERVICE: PCCM  CHIEF COMPLAINT:  Hypercarbic respiratory failure.  BRIEF PATIENT DESCRIPTION:  62 years old AA male with complex PMH including obesity, diastolic dysfunction, CAD, post CABG, OSA, COPD with chronic respiratory failure and non compliance with BiPAP at home. He also has CKD stage 3. Presents with worsening hypercarbic respiratory failure. Failed BiPAP in the ED and had to be intubated.  SIGNIFICANT EVENTS / STUDIES:  8/17  Failed BiPAP --> intubated  LINES / TUBES: ETT 8/17 >> 8/19  CULTURES: MRSA PCR 8/17 >> NEG Urine 8/17 >> NEG  ANTIBIOTICS: Azithro 8/17 >> 8/18 Ceftrx 8/17 >> 8/18  SUBJECTIVE:  Marginal on SBT but cognition fully intact. Extubated and looks good post ext  VITAL SIGNS: Temp:  [98.6 F (37 C)-100.4 F (38 C)] 98.6 F (37 C) (08/19 1115) Pulse Rate:  [66-96] 79 (08/19 1400) Resp:  [15-32] 19 (08/19 1200) BP: (98-167)/(46-78) 107/46 mmHg (08/19 1400) SpO2:  [98 %-100 %] 100 % (08/19 1400) FiO2 (%):  [40 %] 40 % (08/19 1000) Weight:  [131.9 kg (290 lb 12.6 oz)] 131.9 kg (290 lb 12.6 oz) (08/19 0600) HEMODYNAMICS:   VENTILATOR SETTINGS: Vent Mode:  [-] PSV;CPAP FiO2 (%):  [40 %] 40 % Set Rate:  [16 bmp] 16 bmp Vt Set:  [600 mL] 600 mL PEEP:  [5 cmH20] 5 cmH20 Pressure Support:  [10 cmH20] 10 cmH20 Plateau Pressure:  [26 cmH20-30 cmH20] 26 cmH20 INTAKE / OUTPUT: Intake/Output     08/18 0701 - 08/19 0700 08/19 0701 - 08/20 0700   I.V. (mL/kg) 649.6 (4.9) 30 (0.2)   Other 115    NG/GT 1105.7 40   IV Piggyback     Total Intake(mL/kg) 1870.3 (14.2) 70 (0.5)   Urine (mL/kg/hr) 1935 (0.6) 425 (0.4)   Total Output 1935 425   Net -64.7 -355        Stool Occurrence  1 x     PHYSICAL EXAMINATION: General: RASS 0. NAD HEENT: WNL Heart: RRR, + syst M Lungs: No wheezes Abdomen: Soft, NT, no HSM, +  BS Ext: chronic stasis changes, no edema Neuro: no focal deficits  LABS:  CBC Recent Labs     03/30/13  1730  04/01/13  0420  WBC  4.9  9.9  HGB  9.6*  9.4*  HCT  34.8*  34.0*  PLT  103*  115*   Coag's No results found for this basename: APTT, INR,  in the last 72 hours BMET Recent Labs     03/30/13  1730  03/31/13  0414  04/01/13  0420  NA  146*  142  146*  K  4.7  4.9  4.3  CL  97  90*  97  CO2  >45*  37*  42*  BUN  26*  33*  61*  CREATININE  1.59*  1.79*  2.43*  GLUCOSE  107*  238*  128*   Electrolytes Recent Labs     03/30/13  1730  03/31/13  0414  04/01/13  0420  CALCIUM  9.7  9.7  9.5   Sepsis Markers No results found for this basename: LACTICACIDVEN, PROCALCITON, O2SATVEN,  in the last 72 hours ABG Recent Labs     03/31/13  0027  PHART  7.610*  PCO2ART  51.1*  PO2ART  41.0*   Liver Enzymes No results found for this basename: AST, ALT,  ALKPHOS, BILITOT, ALBUMIN,  in the last 72 hours Cardiac Enzymes Recent Labs     03/30/13  1730  TROPONINI  <0.30  PROBNP  620.4*   Glucose Recent Labs     03/31/13  1545  03/31/13  1954  03/31/13  2353  04/01/13  0405  04/01/13  0800  04/01/13  1054  GLUCAP  167*  167*  168*  125*  151*  129*     CXR: NSC CM, IS prominence   ASSESSMENT / PLAN:  PULMONARY A: Acute on chronic hypercarbic respiratory failure Chronic severe restrictive physiology due to obesity and chronic pleural fibrosis OHS/ OSA, non compliance with BiPAP P:   Monitor in ICU post extubation PRN BiPAP for resp distress or increasing lethargy Mandatory BiPAP @ HS  CARDIOVASCULAR A:  H/o diastolic dysfunction H/O CAD P:  Cont home meds (atorvastatin, asa, clopidogrel)  RENAL A:   Acute on chronic renal insuff, nonoliguric  Acute (due to loop diuresis) on chronic (compensatory) metabolic alkalosis P:   Monitor BMET intermittently Correct electrolytes as indicated Hold further diuresis  GASTROINTESTINAL A:   No  issues P:   NPO X 4 hrs post extubation, then advance diet as tolerated SUP ordered  HEMATOLOGIC A:   Mild anemia without evidence of acute blood loss Thrombocytopenia of unclear etiology P:  Monitor CBCs  INFECTIOUS A:   No overt infections P:   Monitor off abx  ENDOCRINE A:   1DM2  P:   Cont SSI  NEUROLOGIC A:   Acute encephalopathy due to hypercarbia, resolved P:   Minimize sedation post extubation   I have personally obtained a history, examined the patient, evaluated laboratory and imaging results, formulated the assessment and plan and placed orders. CRITICAL CARE:  Critical Care Time devoted to patient care services described in this note is 35 minutes.     Billy Fischer, MD ; [facility] [phone].  After 5:30 PM or weekends, call [phone]

## 2013-04-01 NOTE — Progress Notes (Signed)
At 1011 04/01/13 the pt was extubated. Vitals include HR 80 O2 100% at 3% BP 136/65. Pt was instructed to cough and how to use the incentive spirometer. Will continue to monitor.

## 2013-04-02 ENCOUNTER — Inpatient Hospital Stay (HOSPITAL_COMMUNITY): Payer: Medicare Other

## 2013-04-02 LAB — GLUCOSE, CAPILLARY
Glucose-Capillary: 168 mg/dL — ABNORMAL HIGH (ref 70–99)
Glucose-Capillary: 153 mg/dL — ABNORMAL HIGH (ref 70–99)

## 2013-04-02 LAB — CBC
Hemoglobin: 9.4 g/dL — ABNORMAL LOW (ref 13.0–17.0)
MCH: 27.5 pg (ref 26.0–34.0)
MCHC: 27.2 g/dL — ABNORMAL LOW (ref 30.0–36.0)
MCV: 100.9 fL — ABNORMAL HIGH (ref 78.0–100.0)
Platelets: 123 10*3/uL — ABNORMAL LOW (ref 150–400)
RBC: 3.42 MIL/uL — ABNORMAL LOW (ref 4.22–5.81)

## 2013-04-02 LAB — BASIC METABOLIC PANEL
BUN: 70 mg/dL — ABNORMAL HIGH (ref 6–23)
CO2: 39 mEq/L — ABNORMAL HIGH (ref 19–32)
Calcium: 9.2 mg/dL (ref 8.4–10.5)
Glucose, Bld: 103 mg/dL — ABNORMAL HIGH (ref 70–99)
Sodium: 148 mEq/L — ABNORMAL HIGH (ref 135–145)

## 2013-04-02 MED ORDER — FOLIC ACID 1 MG PO TABS
1.0000 mg | ORAL_TABLET | Freq: Every day | ORAL | Status: DC
  Administered 2013-04-02 – 2013-04-04 (×3): 1 mg via ORAL
  Filled 2013-04-02 (×3): qty 1

## 2013-04-02 NOTE — Progress Notes (Signed)
PULMONARY  / CRITICAL CARE MEDICINE  Name: [name] [name] MRN: [id number] DOB: 1950-09-16    ADMISSION DATE:  03/30/2013  PRIMARY SERVICE: PCCM  CHIEF COMPLAINT:  Hypercarbic respiratory failure.  BRIEF PATIENT DESCRIPTION:  62 years old AA male with complex PMH including obesity, diastolic dysfunction, CAD, post CABG, OSA, COPD with chronic respiratory failure and non compliance with BiPAP at home. He also has CKD stage 3. Presents with worsening hypercarbic respiratory failure. Failed BiPAP in the ED and had to be intubated.  SIGNIFICANT EVENTS / STUDIES:  8/17  Failed BiPAP --> intubated  LINES / TUBES: ETT 8/17 >> 8/19  CULTURES: MRSA PCR 8/17 >> NEG Urine 8/17 >> NEG  ANTIBIOTICS: Azithro 8/17 >> 8/18 Ceftrx 8/17 >> 8/18  SUBJECTIVE:  Has tolerated extubation well. No new complaints. No distress. SpO2 100% on 3lpm [place]  VITAL SIGNS: Temp:  [97.3 F (36.3 C)-98 F (36.7 C)] 98 F (36.7 C) (08/20 1240) Pulse Rate:  [61-97] 65 (08/20 1100) Resp:  [14-21] 18 (08/20 0749) BP: (87-121)/(45-79) 103/45 mmHg (08/20 1100) SpO2:  [91 %-100 %] 100 % (08/20 1100) HEMODYNAMICS:   VENTILATOR SETTINGS:   INTAKE / OUTPUT: Intake/Output     08/19 0701 - 08/20 0700 08/20 0701 - 08/21 0700   P.O. 240    I.V. (mL/kg) 30 (0.2)    Other     NG/GT 40    Total Intake(mL/kg) 310 (2.4)    Urine (mL/kg/hr) 1975 (0.6) 400 (0.3)   Total Output 1975 400   Net -1665 -400        Stool Occurrence 1 x      PHYSICAL EXAMINATION: General: RASS 0. NAD HEENT: WNL Heart: RRR, + syst M Lungs: No wheezes, reduced diaphragmatic excursion Abdomen: Soft, NT, no HSM, + BS Ext: chronic stasis changes, no edema Neuro: no focal deficits  LABS:  CBC Recent Labs     03/30/13  1730  04/01/13  0420  04/02/13  0518  WBC  4.9  9.9  7.0  HGB  9.6*  9.4*  9.4*  HCT  34.8*  34.0*  34.5*  PLT  103*  115*  123*   Coag's No results found for this basename: APTT, INR,  in the last 72  hours BMET Recent Labs     03/31/13  0414  04/01/13  0420  04/02/13  0518  NA  142  146*  148*  K  4.9  4.3  4.2  CL  90*  97  100  CO2  37*  42*  39*  BUN  33*  61*  70*  CREATININE  1.79*  2.43*  2.43*  GLUCOSE  238*  128*  103*   Electrolytes Recent Labs     03/31/13  0414  04/01/13  0420  04/02/13  0518  CALCIUM  9.7  9.5  9.2   Sepsis Markers No results found for this basename: LACTICACIDVEN, PROCALCITON, O2SATVEN,  in the last 72 hours ABG Recent Labs     03/31/13  0027  PHART  7.610*  PCO2ART  51.1*  PO2ART  41.0*   Liver Enzymes No results found for this basename: AST, ALT, ALKPHOS, BILITOT, ALBUMIN,  in the last 72 hours Cardiac Enzymes Recent Labs     03/30/13  1730  TROPONINI  <0.30  PROBNP  620.4*   Glucose Recent Labs     04/01/13  0800  04/01/13  1054  04/01/13  1706  04/01/13  2216  04/02/13  0102  04/02/13  1202  GLUCAP  151*  129*  125*  140*  111*  168*     CXR: NSC CM, IS prominence   ASSESSMENT / PLAN:  PULMONARY A: Acute on chronic hypercarbic respiratory failure Chronic severe restrictive physiology due to obesity and chronic pleural fibrosis OHS/ OSA, non compliance with BiPAP P:   Transfer to med-surg Cont PRN BiPAP for resp distress or increasing lethargy Mandatory BiPAP @ HS - needs mask at home with which he will comply  CARDIOVASCULAR A:  H/o diastolic dysfunction H/O CAD P:  Cont home meds (atorvastatin, asa, clopidogrel)  RENAL A:   Acute on chronic renal insuff, nonoliguric  Acute (due to loop diuresis) on chronic (compensatory) metabolic alkalosis P:   Monitor BMET intermittently Correct electrolytes as indicated Hold further diuresis  GASTROINTESTINAL A:   No issues P:   Cont CHO modified SUP ordered  HEMATOLOGIC A:   Mild anemia without evidence of acute blood loss Macrocytosis, Vit B12 level normal. Thrombocytopenia of unclear etiology, stable to improving P:  Monitor CBCs F/U RBC  folate level  INFECTIOUS A:   No overt infections P:   Monitor off abx  ENDOCRINE A:   DM2  P:   Cont SSI ACHS  NEUROLOGIC A:   Acute encephalopathy due to hypercarbia, resolved P:   Minimize sedation post extubation  Transfer to med-surg 8/20  I have personally obtained a history, examined the patient, evaluated laboratory and imaging results, formulated the assessment and plan and placed orders.    Billy Fischer, MD ; [facility] [phone].  After 5:30 PM or weekends, call [phone]

## 2013-04-02 NOTE — Progress Notes (Signed)
Pt is being transferred to [id number]. Gave report to [place], Charity fundraiser and all questions were answered. Pt has a med-surg order and is being transferred via wheelchair.

## 2013-04-03 LAB — GLUCOSE, CAPILLARY: Glucose-Capillary: 75 mg/dL (ref 70–99)

## 2013-04-03 NOTE — Progress Notes (Signed)
Clinical Child psychotherapist (CSW) received a referral for "home/social needs." CSW reviewed pt chart and spoke with RN however CSW & RN unsure of what the referral is pertaining to. CSW is signing off however available if any social work needs arise.  Theresia Bough, MSW, [facility] [phone]

## 2013-04-03 NOTE — Evaluation (Signed)
Physical Therapy Evaluation Patient Details Name: [name] [name] MRN: [id number] DOB: 1951-06-01 Today's Date: 04/03/2013 Time: 4098-1191 PT Time Calculation (min): 24 min  PT Assessment / Plan / Recommendation History of Present Illness  62 y.o. male admitted to [facility] on 03/30/13 with PMH including obesity, diastolic dysfunction, CAD, post CABG, OSA, COPD with chronic respiratory failure and non compliance with BiPAP at home. He also has CKD stage 3. Presents with worsening hypercarbic respiratory failure. Failed BiPAP in the ED and had to be intubated.  Extubated 04/01/13  Clinical Impression  The pt is moving well, although stiffly and needs to use his RW for a short period of time, not his cane.  Despite no significant DOE, the pt's O2 sats were 85% with gait on 3 L O2 [place].  It took 2 min seated rest break with pursed lip breathing to get pt's sats back above 90% on 3 L O2 [place].  The pt really needs to get moving and he agrees that when he gets home and is able to walk more he feels he will return to his normal level of mobility walking with a cane.  I agree with this assessment and will follow acutely.      PT Assessment  Patient needs continued PT services    Follow Up Recommendations  No PT follow up;Supervision - Intermittent    Does the patient have the potential to tolerate intense rehabilitation     NA  Barriers to Discharge   None None    Equipment Recommendations  None recommended by PT    Recommendations for Other Services   None  Frequency Min 3X/week    Precautions / Restrictions Precautions Precautions: Other (comment) Precaution Comments: monitor O2 sats   Pertinent Vitals/Pain See vitals flow sheet.      Mobility  Bed Mobility Bed Mobility: Not assessed (pt sitting OOB in recliner chair) Transfers Transfers: Sit to Stand;Stand to Sit Sit to Stand: 5: Supervision;With upper extremity assist;With armrests;From chair/3-in-1 Stand to Sit: 5: Supervision;With  upper extremity assist;With armrests;To chair/3-in-1 Details for Transfer Assistance: supervision for safety due to pt moving slowly Ambulation/Gait Ambulation/Gait Assistance: 5: Supervision;4: Min assist Ambulation Distance (Feet): 450 Feet Assistive device: Rolling walker;Straight cane Ambulation/Gait Assistance Details: ambulated first with straight cane with min assist.  Very stiff unbalanced gait pattern.  Switched to RW and could be supervision and was able to move more safely with improved gait speed.   Gait Pattern: Step-through pattern;Shuffle;Trunk flexed General Gait Details: No significant DOE, however, pt's O2 sats after gait were hovering around 85% on 3 L O2 [place].  It took ~2 mins of pursed lip breathing to get sats back up to >90%        PT Diagnosis: Difficulty walking;Abnormality of gait;Generalized weakness  PT Problem List: Decreased strength;Decreased activity tolerance;Decreased mobility;Decreased balance;Cardiopulmonary status limiting activity PT Treatment Interventions: DME instruction;Gait training;Functional mobility training;Therapeutic activities;Therapeutic exercise;Balance training;Neuromuscular re-education;Patient/family education     PT Goals(Current goals can be found in the care plan section) Acute Rehab PT Goals Patient Stated Goal: to go home today or tomorrow PT Goal Formulation: With patient Time For Goal Achievement: 04/17/13 Potential to Achieve Goals: Good  Visit Information  Last PT Received On: 04/03/13 Assistance Needed: +1 History of Present Illness: 62 y.o. male admitted to [facility] on 03/30/13 with PMH including obesity, diastolic dysfunction, CAD, post CABG, OSA, COPD with chronic respiratory failure and non compliance with BiPAP at home. He also has CKD stage 3. Presents with worsening hypercarbic  respiratory failure. Failed BiPAP in the ED and had to be intubated.  Extubated 04/01/13       Prior Functioning  Home Living Family/patient  expects to be discharged to:: Private residence Living Arrangements: Spouse/significant other;Children Available Help at Discharge: Family;Available PRN/intermittently Type of Home: Apartment Home Access: Level entry Home Layout: One level Home Equipment: Walker - 2 wheels;Cane - single point Additional Comments: PTA pt walked mostly with cane and used 3 L O2 [place] at baseline Prior Function Level of Independence: Independent with assistive device(s) Comments: still drives Communication Communication: No difficulties Dominant Hand: Right    Cognition  Cognition Arousal/Alertness: Awake/alert Behavior During Therapy: WFL for tasks assessed/performed Overall Cognitive Status: Within Functional Limits for tasks assessed    Extremity/Trunk Assessment Upper Extremity Assessment Upper Extremity Assessment: Generalized weakness Lower Extremity Assessment Lower Extremity Assessment: Generalized weakness Cervical / Trunk Assessment Cervical / Trunk Assessment: Normal      End of Session PT - End of Session Equipment Utilized During Treatment: Oxygen Activity Tolerance: Patient limited by fatigue Patient left: in chair;with call bell/phone within reach    [place] B. Calden Dorsey, PT, DPT [phone]   04/03/2013, 4:32 PM

## 2013-04-03 NOTE — Progress Notes (Signed)
PULMONARY  / CRITICAL CARE MEDICINE  Name: [name] [name] MRN: [id number] DOB: Dec 13, 1950   PCP [place]amese [place], MD   ADMISSION DATE:  03/30/2013  PRIMARY SERVICE: PCCM  CHIEF COMPLAINT:  Hypercarbic respiratory failure.  BRIEF PATIENT DESCRIPTION:  62 years old AA male with complex PMH including obesity, diastolic dysfunction, CAD, post CABG, OSA, COPD with chronic respiratory failure and non compliance with BiPAP at home. He also has CKD stage 3. Presents with worsening hypercarbic respiratory failure. Failed BiPAP in the ED and had to be intubated. 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   has past surgical history that includes Coronary artery bypass graft; Appendectomy; Bilateral VATS ablation; Coronary angioplasty with stent; hemmorhoids; lung sx (2005); Esophagogastroduodenoscopy (08/11/2011); Colonoscopy (08/11/2011); and Cataract extraction.   LINES / TUBES: ETT 8/17 >> 8/19  CULTURES: MRSA PCR 8/17 >> NEG Urine 8/17 >> NEG  ANTIBIOTICS: Azithro 8/17 >> 8/18 Ceftrx 8/17 >> 8/18   SIGNIFICANT EVENTS / STUDIES:  8/17  Failed BiPAP --> intubated 03/31/13 - worsening CREAT 04/02/13: Has tolerated extubation well. No new complaints. No distress. SpO2 100% on 3lpm [place]   SUBJECTIVE/OVERNIGHT/INTERVAL HX 04/03/13: Wants to go home . Denies complaints. Creat  UP  VITAL SIGNS: Temp:  [97.7 F (36.5 C)-98.1 F (36.7 C)] 97.7 F (36.5 C) (08/21 0615) Pulse Rate:  [65-73] 73 (08/21 0615) Resp:  [18-20] 18 (08/21 0615) BP: (103-127)/(45-89) 127/89 mmHg (08/21 0919) SpO2:  [91 %-100 %] 91 % (08/21 0615) HEMODYNAMICS:      INTAKE / OUTPUT: Intake/Output     08/20 0701 - 08/21 0700 08/21 0701 - 08/22 0700   P.O. 210 240   I.V. (mL/kg)     NG/GT     Total Intake(mL/kg) 210 (1.6) 240 (1.8)   Urine (mL/kg/hr) 1775 (0.6) 350 (0.8)   Total Output 1775 350   Net -1565 -110          PHYSICAL EXAMINATION: General: RASS 0. NAD. Sitting in chair. REading paper. Cane at bedside HEENT: WNL Heart: RRR, + syst M Lungs: No wheezes, reduced diaphragmatic excursion Abdomen: Soft, NT, no HSM, + BS Ext: chronic stasis changes, no edema Neuro: no focal deficits  LABS: PULMONARY  Recent Labs Lab 03/31/13 0027  PHART 7.610*  PCO2ART 51.1*  PO2ART 41.0*  HCO3 51.3*  TCO2 >50  O2SAT 83.0    CBC  Recent Labs Lab 03/30/13 1730 04/01/13 0420 04/02/13 0518  HGB 9.6* 9.4* 9.4*  HCT 34.8* 34.0* 34.5*  WBC 4.9 9.9 7.0  PLT 103* 115* 123*    COAGULATION No results found for this basename: INR,  in the last 168 hours  CARDIAC   Recent Labs Lab 03/30/13 1730  TROPONINI <0.30    Recent Labs Lab 03/30/13 1730  PROBNP 620.4*     CHEMISTRY  Recent Labs Lab 03/30/13 1730 03/31/13 0414 04/01/13 0420 04/02/13 0518  NA 146* 142 146* 148*  K 4.7 4.9 4.3 4.2  CL 97 90* 97 100  CO2 >45* 37* 42* 39*  GLUCOSE 107* 238* 128* 103*  BUN 26* 33* 61* 70*  CREATININE 1.59* 1.79* 2.43* 2.43*  CALCIUM 9.7 9.7 9.5 9.2   Estimated Creatinine Clearance: 44.8 ml/min (by C-G formula based on Cr of 2.43).   LIVER No results found for this basename: AST, ALT, ALKPHOS, BILITOT, PROT, ALBUMIN, INR,  in the last 168 hours   INFECTIOUS No results found for this basename: LATICACIDVEN, PROCALCITON,  in the last 168  hours   ENDOCRINE CBG (last 3)   Recent Labs  04/02/13 1711 04/02/13 2145 04/03/13 0742  GLUCAP 153* 121* 75         IMAGING x48h  Dg Chest Port 1 View  04/02/2013   CLINICAL DATA:  62 year old male with respiratory failure, shortness of breath.  EXAM: PORTABLE CHEST - 1 VIEW  COMPARISON:  03/1913 and earlier.  FINDINGS: Portable semi upright AP view at 0506 hr. Extubated. Enteric tube removed. Stable lung volumes. Stable cardiac size and mediastinal contours. Or continued increased interstitial opacity or vascular congestion. No overt edema. Small pleural effusions suspected and stable. No worsening opacity. No pneumothorax.  IMPRESSION: 1. Extubated, enteric tube removed. Stable lung volumes.  2.  Stable ventilation.  Small pleural effusions.   Electronically Signed   By: Augusto Gamble   On: 04/02/2013 07:43    ASSESSMENT / PLAN:  PULMONARY A: Acute on chronic hypercarbic respiratory failure with metabolic alkalosis Chronic severe restrictive physiology due to obesity and chronic pleural fibrosis OHS/ OSA, non compliance with BiPAP 04/03/13: etiology for decompensation still nto fully clear ? Non compliance. Anyways, clinically well. Subjectively wants to go home   P:   Mandatory BiPAP @ HS - needs mask at home with which he will comply Check ABG 04/04/13 am as prelude to dc   CARDIOVASCULAR A:  H/o diastolic dysfunction H/O CAD  1/61/09: clinically euvolemic  P:  Cont home meds (atorvastatin, asa, clopidogrel) Recheck bnp and troponin 8/22/114  RENAL A:   Acute on chronic renal insuff, nonoliguric  Acute (due to loop diuresis) on chronic (compensatory) metabolic alkalosis  04/03/13: Creat up but plateau'ing  P:   Monitor BMET intermittently Correct electrolytes as indicated Hold further diuresis Recheck bmet  GASTROINTESTINAL A:   No issues P:   Cont CHO modified SUP ordered  HEMATOLOGIC A:   Mild anemia without evidence of acute blood  loss Macrocytosis, Vit B12 level normal. Thrombocytopenia of unclear etiology, stable to improving P:  Monitor CBCs F/U RBC folate level  INFECTIOUS A:   No overt infections P:   Monitor off abx  ENDOCRINE A:   DM2  P:   Cont SSI ACHS  NEUROLOGIC A:   Acute encephalopathy due to hypercarbia, resolved P:   No  sedation post extubation  GLOBAL 04/03/13: IN  med-surg since e8/20. GEt  PT, Social work and Care mgmt consult towards dc planning    Dr. Kalman Shan, M.D., [facility].C.P Pulmonary and Critical Care Medicine Staff Physician [facility] [place] Pulmonary and Critical Care Pager: [phone], If no answer or between  15:00h - 7:00h: call [phone]  04/03/2013 10:22 AM

## 2013-04-04 ENCOUNTER — Other Ambulatory Visit (HOSPITAL_COMMUNITY): Payer: Self-pay | Admitting: *Deleted

## 2013-04-04 DIAGNOSIS — E1159 Type 2 diabetes mellitus with other circulatory complications: Secondary | ICD-10-CM

## 2013-04-04 DIAGNOSIS — Z6841 Body Mass Index (BMI) 40.0 and over, adult: Secondary | ICD-10-CM

## 2013-04-04 DIAGNOSIS — I119 Hypertensive heart disease without heart failure: Secondary | ICD-10-CM

## 2013-04-04 DIAGNOSIS — I798 Other disorders of arteries, arterioles and capillaries in diseases classified elsewhere: Secondary | ICD-10-CM

## 2013-04-04 DIAGNOSIS — G4733 Obstructive sleep apnea (adult) (pediatric): Secondary | ICD-10-CM

## 2013-04-04 DIAGNOSIS — J449 Chronic obstructive pulmonary disease, unspecified: Secondary | ICD-10-CM

## 2013-04-04 LAB — HEPATIC FUNCTION PANEL
ALT: 7 U/L (ref 0–53)
Alkaline Phosphatase: 55 U/L (ref 39–117)
Bilirubin, Direct: <0.1 mg/dL (ref 0.0–0.3)
Total Bilirubin: 0.3 mg/dL (ref 0.3–1.2)

## 2013-04-04 LAB — CBC
HCT: 31 % — ABNORMAL LOW (ref 39.0–52.0)
MCHC: 29 g/dL — ABNORMAL LOW (ref 30.0–36.0)
MCV: 95.1 fL (ref 78.0–100.0)
Platelets: 108 10*3/uL — ABNORMAL LOW (ref 150–400)
RDW: 14.3 % (ref 11.5–15.5)
WBC: 5.5 10*3/uL (ref 4.0–10.5)

## 2013-04-04 LAB — BASIC METABOLIC PANEL
BUN: 59 mg/dL — ABNORMAL HIGH (ref 6–23)
Calcium: 9.1 mg/dL (ref 8.4–10.5)
Creatinine, Ser: 1.88 mg/dL — ABNORMAL HIGH (ref 0.50–1.35)
GFR calc Af Amer: 43 mL/min — ABNORMAL LOW (ref >90)
GFR calc non Af Amer: 37 mL/min — ABNORMAL LOW (ref >90)
Glucose, Bld: 99 mg/dL (ref 70–99)
Potassium: 4.2 mEq/L (ref 3.5–5.1)

## 2013-04-04 LAB — GLUCOSE, CAPILLARY: Glucose-Capillary: 112 mg/dL — ABNORMAL HIGH (ref 70–99)

## 2013-04-04 LAB — TROPONIN I: Troponin I: <0.3 ng/mL (ref <0.30)

## 2013-04-04 MED ORDER — ASPIRIN 81 MG PO CHEW: 81.0000 mg | CHEWABLE_TABLET | Freq: Every day | ORAL | Status: DC

## 2013-04-04 MED ORDER — ISOSORBIDE MONONITRATE ER 60 MG PO TB24
60.0000 mg | ORAL_TABLET | Freq: Every day | ORAL | Status: DC
  Filled 2013-04-04: qty 1

## 2013-04-04 MED ORDER — FUROSEMIDE 40 MG PO TABS
40.0000 mg | ORAL_TABLET | Freq: Two times a day (BID) | ORAL | Status: DC
  Filled 2013-04-04 (×3): qty 1

## 2013-04-04 MED ORDER — TIOTROPIUM BROMIDE MONOHYDRATE 18 MCG IN CAPS
18.0000 ug | ORAL_CAPSULE | Freq: Every day | RESPIRATORY_TRACT | Status: DC
  Administered 2013-04-04: 18 ug via RESPIRATORY_TRACT
  Filled 2013-04-04: qty 5

## 2013-04-04 MED ORDER — TIOTROPIUM BROMIDE MONOHYDRATE 18 MCG IN CAPS: 18.0000 ug | ORAL_CAPSULE | RESPIRATORY_TRACT | Status: DC | PRN

## 2013-04-04 MED ORDER — TAMSULOSIN HCL 0.4 MG PO CAPS
0.4000 mg | ORAL_CAPSULE | Freq: Every day | ORAL | Status: DC
  Filled 2013-04-04: qty 1

## 2013-04-04 NOTE — Clinical Social Work Note (Signed)
CSW unable to assess patient's needs before patient's DC.   Roddie Mc, [place], [place], [phone]

## 2013-04-04 NOTE — Progress Notes (Signed)
Pt and wife given discharge instructions and PIV removed.  Pt taken down via wheel chair to discharge location.

## 2013-04-04 NOTE — Discharge Summary (Addendum)
Physician Discharge Summary     Patient ID: [name] [name] MRN: [id number] DOB/AGE: 1951-05-09 62 y.o.  Admit date: 03/30/2013 Discharge date: 04/04/2013  Discharge Diagnoses:  Acute on chronic hypoxic/hypercapnic respiratory failure 2nd to acute on chronic cor pulmonale from COPD, OSA. Difficulty with outpt BiPAP compliance due to mask fit and pressure settings.  Hx of CAD s/p CABG, HTN, chronic Diastolic CHF, PVD, hyperlipidemia.  DM type II.  CKD stage III.  BPH.  Hx of GERD.  Obesity.  Hx of Gout.  Anemia, thrombocytopenia of chronic disease.   Detailed Hospital Course:    62 yo male former smoker presented 8/17 with hypercapnic respiratory failure with hx of COPD, OSA (non-compliant with BiPAP), fibrothorax s/p decortication in 2005, acute on chronic cor pulmonale >> failed BiPAP in ED and intubated. PCCM asked to admit to ICU.   Respiratory failure was felt to be largely a complication related to decompensation of his underlying OSA and exacerbation of cor pulmonale. Felt as though his BIPAP non-compliance at home played a significant role here. Therapeutic interventions included: mechanical ventilation, and aggressive diuresis, in addition to maintenance bronchodilator support.   He was successfully extubated on 8/18. Diuretic regimen was titrated, and he was transitioned to BIPAP at HS s/p extubation. After further questioning we felt that there was likely two issues that were affecting his BIPAP compliance at home. 1) mask fit and 2) fixed setting IPAP. At discharge we will ask home health to re-assess mask fit at home and also place him on an auto-set BIPAP 5/15 w/ 2 liters oxygen. We will have him follow up downloads with Dr Maple Hudson to see if we can make further changes on his home BIPAP settings.      Discharge Plan by diagnoses  Acute on chronic hypoxic/hypercapnic respiratory failure 2nd to acute on chronic cor pulmonale from COPD, OSA. Difficulty with outpt BiPAP compliance  due to mask fit and pressure settings.  Much improved with assisted ventilation, supplemental oxygen, and diuresis.  P:  -continue supplemental oxygen to keep SpO2 > 90%  -BiPAP qhs >> change to auto BiPAP with range 5 to 15 cm H2O with 2 liters oxygen as outpt, and then re-assess with download  -will have his DME re-assess his mask fit as outpt >> [facility]  -continue spiriva, pulmicort, and albuterol >>>have stopped atrovent  -continue lasix 40 mg bid  NEEDS CMP AND CBC at f/u with Dr Maple Hudson on 8/29  Hx of CAD s/p CABG, HTN, chronic Diastolic CHF, PVD, hyperlipidemia.  P:  -continue plavix, lopressor  -change ASA back to 81 mg daily  -resume imdur 60 mg daily  -use lipitor in place of crestor while inpatient  -f/u with Dr. Andree Coss as outpt  DM type II.  P:  -diet controlled as outpt  -further assessment as outpt with PCP   CKD stage III.  BPH.  P:  -resume flomax   Hx of GERD.  Obesity.  P:  -continue protonix  -weight reduction  Hx of Gout.  P:  -prn colchicine  Anemia, thrombocytopenia of chronic disease.  P:  -f/u CBC as outpt   Significant Hospital tests/ studies/ interventions and procedures  Consults [facility] Cardiology   Discharge Exam: BP 113/67  Pulse 78  Temp(Src) 98.4 F (36.9 C) (Oral)  Resp 20  Ht 6' (1.829 m)  Wt 131.9 kg (290 lb 12.6 oz)  BMI 39.43 kg/m2  SpO2 99% 2 liters  PHYSICAL EXAMINATION:  General: No distress, sitting  in chair  Neuro: Alert, normal strength, follows commands  HEENT: No sinus tenderness  Cardiovascular: Regular, no murmur  Lungs: No wheeze/rales  Abdomen: Soft, non tender  Musculoskeletal: No edema  Skin: Chronic venous stasis changes   Labs at discharge Lab Results  Component Value Date   CREATININE 1.88* 04/04/2013   BUN 59* 04/04/2013   NA 140 04/04/2013   K 4.2 04/04/2013   CL 97 04/04/2013   CO2 38* 04/04/2013   Lab Results  Component Value Date   WBC 5.5 04/04/2013   HGB  9.0* 04/04/2013   HCT 31.0* 04/04/2013   MCV 95.1 04/04/2013   PLT 108* 04/04/2013   Lab Results  Component Value Date   ALT 7 04/04/2013   AST 11 04/04/2013   ALKPHOS 55 04/04/2013   BILITOT 0.3 04/04/2013   Lab Results  Component Value Date   INR 1.05 07/13/2011   INR 1.02 01/02/2011   INR 0.95 12/04/2010    Current radiology studies No results found.  Disposition:  01-Home or Self Care      Discharge Orders   Future Appointments Provider Department Dept Phone   04/07/2013 1:30 PM [facility] [phone]   04/11/2013 2:30 PM Waymon Budge, MD [place] [facility] [phone]   04/25/2013 2:00 PM Wilburt Finlay, PA-C [facility] [place] [phone]   Future Orders Complete By Expires   Diet - low sodium heart healthy  As directed    Increase activity slowly  As directed        Medication List    STOP taking these medications       cephALEXin 500 MG capsule  Commonly known as:  KEFLEX     ipratropium 0.02 % nebulizer solution  Commonly known as:  ATROVENT      TAKE these medications       albuterol 108 (90 BASE) MCG/ACT inhaler  Commonly known as:  PROVENTIL HFA;VENTOLIN HFA  Inhale 2 puffs into the lungs every 6 (six) hours as needed. For wheezing     albuterol (2.5 MG/3ML) 0.083% nebulizer solution  Commonly known as:  PROVENTIL  Take 3 mLs (2.5 mg total) by nebulization every 6 (six) hours.     aspirin EC 81 MG tablet  Take 81 mg by mouth daily.     budesonide 0.25 MG/2ML nebulizer solution  Commonly known as:  PULMICORT  Take 2 mLs (0.25 mg total) by nebulization 2 (two) times daily. Dx: 496     clopidogrel 75 MG tablet  Commonly known as:  PLAVIX  Take 1 tablet (75 mg total) by mouth daily.     COLCRYS 0.6 MG tablet  Generic drug:  colchicine  Take 0.6 mg by mouth daily.     furosemide 40 MG tablet  Commonly known as:  LASIX  Take 1 tablet (40 mg total) by  mouth 2 (two) times daily. May take an additional 40 mg if he has additional edema.     isosorbide mononitrate 60 MG 24 hr tablet  Commonly known as:  IMDUR  Take 1 tablet (60 mg total) by mouth daily.     metoprolol tartrate 25 MG tablet  Commonly known as:  LOPRESSOR  Take 0.5 tablets (12.5 mg total) by mouth 2 (two) times daily.     mometasone 50 MCG/ACT nasal spray  Commonly known as:  NASONEX  Place 2 sprays into the nose as needed (allergies).     multivitamin with  minerals Tabs tablet  Take 1 tablet by mouth daily.     nitroGLYCERIN 0.4 MG SL tablet  Commonly known as:  NITROSTAT  Place 1 tablet (0.4 mg total) under the tongue every 5 (five) minutes as needed. For chest pain     pantoprazole 40 MG tablet  Commonly known as:  PROTONIX  Take 40 mg by mouth daily.     rosuvastatin 20 MG tablet  Commonly known as:  CRESTOR  Take 20 mg by mouth daily.     tamsulosin 0.4 MG Caps capsule  Commonly known as:  FLOMAX  Take 0.4 mg by mouth daily.     tiotropium 18 MCG inhalation capsule  Commonly known as:  SPIRIVA  Place 18 mcg into inhaler and inhale as needed (shorness of breath).       Follow-up Information   Follow up with Waymon Budge, MD On 04/11/2013. (230 pm )    Specialty:  Pulmonary Disease   Contact information:   [address]  [facility] [place] [address] [phone]       Follow up with Runell Gess, MD On 04/25/2013. (2pm w/ Judie Grieve hagar )    Specialty:  Cardiology   Contact information:   [address] [place] [address] [phone]       Discharged Condition: good  Physician Statement:   The Patient was personally examined, the discharge assessment and plan has been personally reviewed and I agree with ACNP Tunisha Ruland's assessment and plan. > 30 minutes of time have been dedicated to discharge assessment, planning and discharge instructions.   SignedAnders Simmonds 04/04/2013, 11:19 AM  Coralyn Helling, MD [facility] Pulmonary/Critical Care 04/04/2013, 11:19 AM Pager:  [phone] After 3pm call: [phone]

## 2013-04-04 NOTE — Progress Notes (Signed)
PULMONARY  / CRITICAL CARE MEDICINE  Name: [name] [name] MRN: [id number] DOB: [birth year]    ADMISSION DATE:  03/30/2013  REFERRING MD :  Pricilla Loveless  CHIEF COMPLAINT:  Short of breath  BRIEF PATIENT DESCRIPTION:  62 yo male former smoker presented with hypercapnic respiratory failure with hx of COPD, OSA (non-compliant with BiPAP), fibrothorax s/p decortication in 2005, acute on chronic cor pulmonale >> failed BiPAP in ED and intubated.  PCCM asked to admit to ICU.  Followed in pulmonary office by Dr. Maple Hudson  SIGNIFICANT EVENTS: 8/17 Failed BiPAP in ED, VDRF >> admit to ICU, Cardiology consulted for CHF >> sign off 8/18 8/20 Transfer to floor bed  LINES / TUBES: 8/17 ETT >> 8/19   CULTURES: Urine 8/17 >> negative  ANTIBIOTICS: Rocephin 8/17 >> 8/17 Zithromax 8/17 >> 8/17   SUBJECTIVE:  Feeling better.  Denies chest pain, cough, or wheeze.  Feels BiPAP set up in hospital more comfortable >> c/o mask not fitting well at home, and too much pressure from BiPAP machine at home.  VITAL SIGNS: Temp:  [98.4 F (36.9 C)-98.9 F (37.2 C)] 98.4 F (36.9 C) (08/22 0511) Pulse Rate:  [67-82] 78 (08/22 0511) Resp:  [18-20] 20 (08/22 0511) BP: (110-113)/(54-72) 113/67 mmHg (08/22 0511) SpO2:  [85 %-100 %] 98 % (08/22 0955) 2 liters [place]  Negative 6.1 liters fluid balance since admission  PHYSICAL EXAMINATION: General: No distress, sitting in chair Neuro:  Alert, normal strength, follows commands HEENT:  No sinus tenderness Cardiovascular:  Regular, no murmur Lungs:  No wheeze/rales Abdomen:  Soft, non tender Musculoskeletal:  No edema Skin:  Chronic venous stasis changes   Recent Labs Lab 04/01/13 0420 04/02/13 0518 04/04/13 0558  NA 146* 148* 140  K 4.3 4.2 4.2  CL 97 100 97  CO2 42* 39* 38*  BUN 61* 70* 59*  CREATININE 2.43* 2.43* 1.88*  GLUCOSE 128* 103* 99    Recent Labs Lab 04/01/13 0420 04/02/13 0518 04/04/13 0558  HGB 9.4* 9.4* 9.0*  HCT 34.0*  34.5* 31.0*  WBC 9.9 7.0 5.5  PLT 115* 123* 108*   No results found.  CBG (last 3)   Recent Labs  04/03/13 1823 04/03/13 2129 04/04/13 0812  GLUCAP 97 117* 112*      ASSESSMENT / PLAN:  A: Acute on chronic hypoxic/hypercapnic respiratory failure 2nd to acute on chronic cor pulmonale from COPD, OSA.  Difficulty with  outpt BiPAP compliance due to mask fit and pressure settings.   Much improved with assisted ventilation, supplemental oxygen, and diuresis. P: -continue supplemental oxygen to keep SpO2 > 90% -BiPAP qhs >> change to auto BiPAP with range 5 to 15 cm H2O with 2 liters oxygen as outpt, and then re-assess with download -will need to have his DME re-assess his mask fit as outpt >> used [facility] -continue spiriva, pulmicort, and albuterol -goal even to negative fluid balance >> continue lasix 40 mg bid  A: Hx of CAD s/p CABG, HTN, chronic Diastolic CHF, PVD, hyperlipidemia. P: -continue plavix, lopressor -change ASA back to 81 mg daily -resume imdur 60 mg daily -use lipitor in place of crestor while inpatient -f/u with Dr. Andree Coss as outpt  A: DM type II. P: -diet controlled as outpt -further assessment as outpt with PCP  A: CKD stage III. BPH. P: -resume flomax -f/u with Renal as outpt  A: Hx of GERD. Obesity. P: -continue protonix -weight reduction  A: Hx of Gout. P: -prn colchicine  A: Anemia, thrombocytopenia of chronic disease. P: -f/u CBC as outpt  Stable for d/c home.  Will need outpt follow up with BMET, CBC in 1 week.  Coralyn Helling, MD [facility] Pulmonary/Critical Care 04/04/2013, 10:06 AM Pager:  [phone] After 3pm call: [phone]

## 2013-04-04 NOTE — Progress Notes (Signed)
Patient placed on BIPAP 10/5 with 2L oxygen bleed in. He wears  a Full Face Mask and appears to tolerate the BIPAP very well at this time. RT will continue to monitor.

## 2013-04-04 NOTE — Discharge Summary (Signed)
Coralyn Helling, MD [facility] Pulmonary/Critical Care 04/04/2013, 11:41 AM Pager:  [phone] After 3pm call: [phone]

## 2013-04-07 ENCOUNTER — Inpatient Hospital Stay (HOSPITAL_COMMUNITY): Admission: RE | Admit: 2013-04-07 | Payer: Medicare Other | Source: Ambulatory Visit

## 2013-04-09 ENCOUNTER — Encounter (HOSPITAL_COMMUNITY)
Admission: RE | Admit: 2013-04-09 | Discharge: 2013-04-09 | Disposition: A | Discharge status: Home / Self Care | Payer: Medicare Other | Source: Ambulatory Visit | Attending: Nephrology | Admitting: Nephrology

## 2013-04-09 DIAGNOSIS — D638 Anemia in other chronic diseases classified elsewhere: Secondary | ICD-10-CM | POA: Insufficient documentation

## 2013-04-09 DIAGNOSIS — N183 Chronic kidney disease, stage 3 unspecified: Secondary | ICD-10-CM | POA: Insufficient documentation

## 2013-04-09 LAB — RENAL FUNCTION PANEL
Albumin: 3.1 g/dL — ABNORMAL LOW (ref 3.5–5.2)
BUN: 36 mg/dL — ABNORMAL HIGH (ref 6–23)
Chloride: 99 mEq/L (ref 96–112)
Creatinine, Ser: 1.57 mg/dL — ABNORMAL HIGH (ref 0.50–1.35)
Glucose, Bld: 131 mg/dL — ABNORMAL HIGH (ref 70–99)
Phosphorus: 2.2 mg/dL — ABNORMAL LOW (ref 2.3–4.6)
Potassium: 4.7 mEq/L (ref 3.5–5.1)

## 2013-04-09 LAB — IRON AND TIBC
Iron: 33 ug/dL — ABNORMAL LOW (ref 42–135)
Saturation Ratios: 14 % — ABNORMAL LOW (ref 20–55)
UIBC: 209 ug/dL (ref 125–400)

## 2013-04-09 LAB — POCT HEMOGLOBIN-HEMACUE: Hemoglobin: 8.8 g/dL — ABNORMAL LOW (ref 13.0–17.0)

## 2013-04-09 MED ORDER — DARBEPOETIN ALFA-POLYSORBATE 500 MCG/ML IJ SOLN
100.0000 ug | INTRAMUSCULAR | Status: DC
  Filled 2013-04-09: qty 1

## 2013-04-09 MED ORDER — DARBEPOETIN ALFA-POLYSORBATE 100 MCG/0.5ML IJ SOLN
INTRAMUSCULAR | Status: AC
  Administered 2013-04-09: 100 ug via SUBCUTANEOUS
  Filled 2013-04-09: qty 0.5

## 2013-04-10 ENCOUNTER — Inpatient Hospital Stay: Payer: Medicare Other | Admitting: Adult Health

## 2013-04-11 ENCOUNTER — Ambulatory Visit (INDEPENDENT_AMBULATORY_CARE_PROVIDER_SITE_OTHER): Payer: Medicare Other | Admitting: Internal Medicine

## 2013-04-11 ENCOUNTER — Encounter: Payer: Self-pay | Admitting: Internal Medicine

## 2013-04-11 ENCOUNTER — Other Ambulatory Visit (INDEPENDENT_AMBULATORY_CARE_PROVIDER_SITE_OTHER): Payer: Medicare Other

## 2013-04-11 VITALS — BP 124/76 | HR 92 | Ht 73.0 in | Wt 316.8 lb

## 2013-04-11 DIAGNOSIS — J9620 Acute and chronic respiratory failure, unspecified whether with hypoxia or hypercapnia: Secondary | ICD-10-CM

## 2013-04-11 DIAGNOSIS — G4733 Obstructive sleep apnea (adult) (pediatric): Secondary | ICD-10-CM

## 2013-04-11 DIAGNOSIS — J9610 Chronic respiratory failure, unspecified whether with hypoxia or hypercapnia: Secondary | ICD-10-CM

## 2013-04-11 LAB — COMPREHENSIVE METABOLIC PANEL
Alkaline Phosphatase: 60 U/L (ref 39–117)
BUN: 29 mg/dL — ABNORMAL HIGH (ref 6–23)
CO2: 35 mEq/L — ABNORMAL HIGH (ref 19–32)
Creatinine, Ser: 1.6 mg/dL — ABNORMAL HIGH (ref 0.4–1.5)
GFR: 57.62 mL/min — ABNORMAL LOW (ref >60.00)
Glucose, Bld: 98 mg/dL (ref 70–99)
Total Bilirubin: 0.4 mg/dL (ref 0.3–1.2)

## 2013-04-11 LAB — CBC WITH DIFFERENTIAL/PLATELET
Eosinophils Relative: 2.2 % (ref 0.0–5.0)
HCT: 28.1 % — ABNORMAL LOW (ref 39.0–52.0)
Hemoglobin: 8.9 g/dL — ABNORMAL LOW (ref 13.0–17.0)
Lymphs Abs: 1.1 10*3/uL (ref 0.7–4.0)
MCV: 88.3 fl (ref 78.0–100.0)
Monocytes Absolute: 0.6 10*3/uL (ref 0.1–1.0)
Monocytes Relative: 9.8 % (ref 3.0–12.0)
Neutro Abs: 3.9 10*3/uL (ref 1.4–7.7)
Platelets: 185 10*3/uL (ref 150.0–400.0)
WBC: 5.8 10*3/uL (ref 4.5–10.5)

## 2013-04-11 NOTE — Progress Notes (Signed)
Patient ID: [name] [name], male    DOB: 11-12-1950, 62 y.o.   MRN: [id number]  HPI 01/23/11- COPD, CAD/diastolic dysfunction, OSA, chronic respiratory failure, obesity Hypoventilation, cor pulmonale Hosp since last here- once for "shakes"-they told him wasn't using CPAP right, once for epistaxis, once to place urinary stent, and has had a right ? renal biopsy. Told anemic.  Breathing stable, but can't lie flat- feels smothered even on O2.  Runs O2 2-3L/M usually. I gave permision to go to 4 during exertion. Notes "tiresome" feeling midchest, related to exertion/ climbing stairs and relieved by rest. Told to pace himself and not try to push through that.  Discussed need for O2. He doesn't sleep well with his BiPAP 15/12. Discussed sleep hygiene- discussed room temperature. He was more comfortable with autotitration in hosp and we discussed a trial of that at home.  Has restarted diuretic since leg edema has come back.   05/25/11-  COPD, CAD/diastolic dysfunction, OSA, chronic respiratory failure, obesity Hypoventilation, cor pulmonale Went to ER last week- short of breath. Air wasn't satisfying. Was started back on shot for anemia. Feels some better.  Reports sneezing, postnasal drainage and a sense of mucus in his upper chest. Denies fever, sore throat and doesn't think he has a cold.  06/28/11-  COPD, CAD/diastolic dysfunction, OSA, chronic respiratory failure, obesity Hypoventilation, cor pulmonale Recently hospitalized October 25-29, notes reviewed with him and x-ray images reviewed by me. He was hospitalized for hypercapnic respiratory failure with transient encephalopathy and renal insufficiency. Since discharge he has regained some ankle edema while off of furosemide. He has a nephrology appointment later this month. He is concerned that he was taken off his diabetes medicines and is directed to discuss this with his primary physician immediately. He has some persistent soreness across his  anterior chest wall at the level of the xiphoid but otherwise breathing better with less exertional dyspnea and before he went in the hospital. He remains dependent on continuous oxygen at 2 L. He continues his BiPAP machine all night, every night but says he was sleeping better with it  on autotitration with a lower pressure used in the hospital.  09/25/11- COPD, CAD/diastolic dysfunction, OSA, chronic respiratory failure, obesity Hypoventilation, cor pulmonale Since last here he was hospitalized for respiratory failure with CHF and obesity hypoventilation. Better with diuresis. BiPAP 13/10 is now comfortable and used all night every night with supplemental oxygen 4 L. Tolerated colonoscopy without respiratory distress. Uses a rescue inhaler only occasionally but says it does help then.  11/23/11- COPD, CAD/diastolic dysfunction, OSA, chronic respiratory failure, obesity Hypoventilation, cor pulmonale She is noticing some increased shortness of breath on exertion such as getting in the car but no increase in ankle edema which is well controlled. Occasional cough is not progressive or productive. Noticing more rhinorrhea with the pollen. Continues BiPAP  I 13/E 10 all night every night.  02/07/12- COPD, CAD/diastolic dysfunction, OSA, chronic respiratory failure, obesity Hypoventilation, cor pulmonale  pt states doing some better.wakes up out of a nap coughing feels like he's choking.  Post Hosp- 01/14/12- 01/23/12-discharge diagnoses reviewed with him: Coronary artery disease/CABG, diastolic dysfunction, COPD with chronic respiratory failure, acute respiratory failure with hypoxia, acute on chronic renal failure, DM. He was intubated for 4 days and treated with Zosyn, vancomycin and Avelox, Levaquin, Zithromax and Rocephin. Cultures negative. He was to wean off of prednisone over 3 days. He was hoarse after intubation but that has resolved. He is using oxygen 1-2 L and  BiPAP  13/10 at night. He feels he is  pretty close to his baseline.  03/28/12-  COPD, CAD/diastolic dysfunction/ chronic CHF, OSA, chronic respiratory failure, obesity Hypoventilation, cor pulmonale  6 MWT today. Pt states breathing has been "okay" since last OV. Pt states having trouble falling alseep-not sleeping well on BiPAP. Pt states mask fit well but thinks the pressure needs to be adjusted again. Currently 13/10+ oxygen at 2 L/ Advanced. He sleeps better in a recliner with oxygen but without his BiPAP. Getting iron injections for anemia. We discussed shortness of breath and anemia. Pain left anterior axillary line described as a sore ache over the past week. He is able to raise his arm. Thinks he maybe lifted something wrong. COPD assessment test (CAT) 21/40 CXR 01/14/12- 1 view- IMPRESSION:  Stable support apparatus. Residual mild interstitial prominence  with slight improvement in aeration. No convincing pulmonary  edema. Probable small left pleural effusion with left basilar  atelectasis or infiltrate.  Original Report Authenticated By: Natasha Mead, M.D.   11/08/12-COPD, CAD/diastolic dysfunction/ chronic CHF, OSA, chronic respiratory failure, obesity Hypoventilation, cor pulmonale Hospitalized again between March 9 and 12 for acute on chronic renal failure with congestive heart failure, chronic respiratory failure on BiPAP and acute respiratory failure with hypoxia. He says he is feeling better at this time. Continues oxygen 3 L/Advanced. He has been using BiPAP 13/10 but thinks he slept better with CPAP. We discussed options. Usually when he decompensates it is from fluid overload triggering CO2 retention and hypoventilation.  CXR 10/20/12 IMPRESSION:  Vascular congestion and cardiomegaly, with small bilateral pleural  effusions and bibasilar airspace opacification. Findings are most  compatible with recurrent pulmonary edema, though underlying  pneumonia cannot be excluded.  Original Report Authenticated By: Tonia Ghent,  M.D.  12/02/2012 Post Hospital follow up  Returns for a post hospital follow up .  Reports doing well overall but still having some SOB, thinks this may be due to his pressure not being high enough on BIPAP. Since decreasing BIPAP 1 month ago  down to 12/10 he does not feel as good. Trouble sleeping.  Was admitted for decompensated cor pulmonale w/ fluid overload with subsequent acute on chronic hypercarbic resp failure.  He admits he can not afford his advair and spiriva- therefore he is not taking.  He does not wear BIPAP everynight. We discussed the implications of this and untreated OHS/OSA. He naps a lot but does not wear BIPAP.  He was treated with diuresis with improvement.  Discharged on Lasix 40mg  . Lower dose due to bump in scr. Has OP follow up with Renal d/t renal insufficiency.   02/05/13- COPD, CAD/diastolic dysfunction/ chronic CHF, OSA, chronic respiratory failure, obesity Hypoventilation, cor pulmonale Hosp 6/16-6/19/14 Acute on Chronic Resp Failure Discharge Plan:  -pt educated on BiPAP compliance at home  -continue pulmicort, scheduled albuterol / atrovent (med cost has been an issue)  -wean prednisone to off  -given information for [employer] at [employer]. Pulmonary Rehab was a cost issue (45$ per visit)  -RN home health to review medications and reinforce need for BiPAP 12/12 wO2 2-3L for sleep, O2 3-4 awake.  FOLLOWS FOR: Breathing has improved since leaving the hospital. Reports DOE, chest tightness with exertion and slight coughing from time to time. Wife needs FMLA form. CXR 01/29/13 1View IMPRESSION:  Slightly improved aeration in the lungs may represent decreasing  edema.  Original Report Authenticated By: Richarda Overlie, M.  04/11/13- COPD, CAD/diastolic dysfunction/ chronic CHF, OSA, chronic  respiratory failure, obesity Hypoventilation, cor pulmonale FOLLOWS FOR: reports breathing is essentially unchanged but he feels like he's "burning through more oxygen than  I used to." denies wheezing, increased SOB, tightness, increased cough, f/c/s Just in hospital again acute on chronic respiratory failure with CHF and COPD. Today he feels well except for frontal headache. Continuing oxygen at 2.5 L. BIPAP Auto 5-15/ O2 2L/ Advanced CXR 1V 8/14 IMPRESSION:  1. Extubated, enteric tube removed. Stable lung volumes.  2. Stable ventilation. Small pleural effusions.  Electronically Signed  By: Augusto Gamble  On: 04/02/2013 07:43   Review of Systems-see HPI Constitutional:   No- weight loss,  No-night sweats, fevers, chills, +fatigue, lassitude. HEENT:   No-  headaches, difficulty swallowing, tooth/dental problems, sore throat,       No-sneezing, itching, ear ache, nasal congestion, post nasal drip,  CV:   no-orthopnea, PND,  Much less-swelling in lower extremities, anasarca, dizziness, palpitations Resp: + shortness of breath with exertion or at rest.              No-   productive cough,  + non-productive cough,  No-  coughing up of blood.              No-   change in color of mucus.  No- wheezing.   Skin: No-   rash or lesions. GI:  No-   heartburn, indigestion, abdominal pain, nausea, vomiting,  GU: MS:  No-   joint pain or swelling.  Neuro- nothing unusual Psych:  No- change in mood or affect. No depression or anxiety.  No memory loss. Objective:  OBJ- Physical Exam General- Alert, Oriented, Affect-appropriate, Distress- none acute, obese, O2 3-4 LPM w/ exertion Skin- rash-none, lesions- none, excoriation- none Lymphadenopathy- none Head- atraumatic            Eyes- Gross vision intact, PERRLA, conjunctivae and secretions clear            Ears- Hearing, canals-normal            Nose- Clear, no-Septal dev, mucus, polyps, erosion, perforation             Throat- Mallampati II , mucosa clear , drainage- none, tonsils- atrophic Neck- flexible , trachea midline, no stridor , thyroid nl, carotid no bruit Chest - symmetrical excursion , unlabored            Heart/CV- RRRsoft , no murmur , no gallop  , no rub, nl s1 s2                           - JVD-+trace , edema- none, stasis changes+, varices- none           Lung- +distant but clear to P&A, no rales, wheeze- none, cough- none , dullness-none, rub- none           Chest wall-  Abd-  Br/ Gen/ Rectal- Not done, not indicated Extrem- cyanosis- none, clubbing, none, atrophy- none, strength- nl Neuro- grossly intact to observation

## 2013-04-11 NOTE — Patient Instructions (Addendum)
Order lab- CBC w/ diff, CMET      Dx chronic respiratory failure  Please call as needed

## 2013-04-20 NOTE — Assessment & Plan Note (Signed)
Impression that his hospitalization has been that his exacerbations reflect noncompliance

## 2013-04-20 NOTE — Assessment & Plan Note (Signed)
Today he is probably about as good as he gets. We discussed fluid retention, medication compliance, BiPAP compliance and oxygen. Based on directions from discharge summary, we are getting blood count and chemistry labs

## 2013-04-25 ENCOUNTER — Ambulatory Visit (INDEPENDENT_AMBULATORY_CARE_PROVIDER_SITE_OTHER): Payer: Medicare Other | Admitting: Physician Assistant

## 2013-04-25 ENCOUNTER — Encounter: Payer: Self-pay | Admitting: Physician Assistant

## 2013-04-25 VITALS — BP 126/78 | HR 88 | Ht 73.0 in | Wt 292.0 lb

## 2013-04-25 DIAGNOSIS — I509 Heart failure, unspecified: Secondary | ICD-10-CM

## 2013-04-25 DIAGNOSIS — I2510 Atherosclerotic heart disease of native coronary artery without angina pectoris: Secondary | ICD-10-CM

## 2013-04-25 DIAGNOSIS — I5032 Chronic diastolic (congestive) heart failure: Secondary | ICD-10-CM

## 2013-04-25 MED ORDER — ROSUVASTATIN CALCIUM 20 MG PO TABS: 20.0000 mg | ORAL_TABLET | Freq: Every day | ORAL | Status: DC

## 2013-04-25 NOTE — Patient Instructions (Signed)
Follow with Dr. Allyson Sabal in 6 months or sooner if needed.

## 2013-04-25 NOTE — Assessment & Plan Note (Addendum)
Well compensated from a cardiac standpoint.  No signs of acute heart failure.  Decreased oxygen saturation like from his COPD and pulmonary fibrosis..    Followup with Dr. Allyson Sabal in 6 months

## 2013-04-25 NOTE — Progress Notes (Signed)
Date:  04/25/2013   ID:  [name] [name], DOB 19-Apr-1951, MRN [id number]  PCP:  Jerald Kief, MD  Primary Cardiologist:  Allyson Sabal     History of Present Illness: [name] [name] is a 62 y.o.  obese African American male with history of coronary artery disease, diastolic dysfunction, COPD who is O2 dependent, peripheral vascular disease, chronic renal insufficiency stage 3, diabetes mellitus, hypertension and obstructive sleep apnea. The patient was hospitalized earlier in the year with acute respiratory failure and hypoxia, likely related to volume overload from diastolic dyslipidemia. He was diuresed with IV Lasix, metolazone. He was also seen from April 3rd to April 7th, apparently with a COPD exacerbation. The patient has a stent in the left SFA which was put in in September of 2009 for claudication. He also does have a short-segment occlusion of the right mid SFA with tibioperoneal disease as well. He had coronary artery bypass grafting in 2001 with PCI stenting of his OM branch in December of 2005. Last cardiac catheterization was in July of 2011 revealing a patent LIMA to the LAD, 80% stenosis beyond that, and a patent vein to the large venous branch, a total vein to the PDA which was restented using a Taxus ion drug-eluting stent with a patent circumflex to the obtuse marginal branch and normal LV function.   His last nuclear stress test was February of this year. His ejection fraction was 62%; it was a low risk study with normal nuclear imaging. No significant change from previous study. This study was done for ST changes to his EKG which are still present and look similar to previous EKG with a right bundle branch block and slightly tachycardic at 104.  His last admission was 8/17-8/22/14 for acute on chronic hypoxic/hypercapnic respiratory failure 2nd to acute on chronic cor pulmonale from COPD, OSA for which he had to be intubated.   The patient presents today for followup.  He  continues to complain of dyspnea O2 sats in the clinic, on O2, are 87-92.  His SOB does not change with position.   He currently denies nausea, vomiting, fever, chest pain, orthopnea, dizziness, PND, cough, congestion, abdominal pain, hematochezia, melena, lower extremity edema.  Wt Readings from Last 3 Encounters:  04/25/13 292 lb (132.45 kg)  04/11/13 316 lb 12.8 oz (143.7 kg)  04/01/13 290 lb 12.6 oz (131.9 kg)     Past Medical History  Diagnosis Date  . Coronary atherosclerosis of unspecified type of vessel, native or graft   . Chronic pulmonary heart disease, unspecified   . Chronic respiratory failure   . Unspecified sleep apnea   . Chronic airway obstruction, not elsewhere classified   . Angina   . Shortness of breath   . Anemia   . Chronic kidney disease   . Anxiety   . Hypertension   . CHF (congestive heart failure)   . GERD (gastroesophageal reflux disease)   . History of gout   . Chronic diastolic CHF (congestive heart failure) 10/28/2007    Hospitalized with diastolic CHF controlled with diuresis and BiPAP 2D April 2012 EF 55-60 with severe LVH and grade 1 diastolic dysfunction    . 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 PVD (peripheral vascular disease) 07/13/2011    S/P Lt SFA PTA Sept 2009, known occluded Rt SFA   . Obstructive sleep apnea 10/28/2007    BiPAP 13/10 with O2 3 L Advanced.      Marland Kitchen  Swelling of limb     Venous Duplex, 09/03/2012 - no evidence of thrombus or thrombophelitis  . S/P CABG (coronary artery bypass graft)     Nuc, 09/17/2012 - Significant ST abnormalities that are suggestive of ischemia, low risk nuclear study  . TIA (transient ischemic attack)     2D Echo, 12/05/2010 - EF 55-60%, severe LVH  . PVD (peripheral vascular disease)   . COPD (chronic obstructive pulmonary disease)   . OSA (obstructive sleep apnea)   . Chronic renal insufficiency, stage III (moderate)   . Hyperlipidemia     Current Outpatient  Prescriptions  Medication Sig Dispense Refill  . albuterol (PROVENTIL HFA;VENTOLIN HFA) 108 (90 BASE) MCG/ACT inhaler Inhale 2 puffs into the lungs every 6 (six) hours as needed. For wheezing      . albuterol (PROVENTIL) (2.5 MG/3ML) 0.083% nebulizer solution Take 3 mLs (2.5 mg total) by nebulization every 6 (six) hours.  75 mL  12  . aspirin EC 81 MG tablet Take 81 mg by mouth daily.      . budesonide (PULMICORT) 0.25 MG/2ML nebulizer solution Take 2 mLs (0.25 mg total) by nebulization 2 (two) times daily. Dx: 496  120 mL  5  . clopidogrel (PLAVIX) 75 MG tablet Take 1 tablet (75 mg total) by mouth daily.  30 tablet  11  . COLCRYS 0.6 MG tablet Take 0.6 mg by mouth daily.       . furosemide (LASIX) 40 MG tablet Take 1 tablet (40 mg total) by mouth 2 (two) times daily. May take an additional 40 mg if he has additional edema.  60 tablet  3  . isosorbide mononitrate (IMDUR) 60 MG 24 hr tablet Take 1 tablet (60 mg total) by mouth daily.  90 tablet  3  . metoprolol tartrate (LOPRESSOR) 25 MG tablet Take 0.5 tablets (12.5 mg total) by mouth 2 (two) times daily.  30 tablet  10  . mometasone (NASONEX) 50 MCG/ACT nasal spray Place 2 sprays into the nose as needed (allergies).      . Multiple Vitamin (MULITIVITAMIN WITH MINERALS) TABS Take 1 tablet by mouth daily.      . nitroGLYCERIN (NITROSTAT) 0.4 MG SL tablet Place 1 tablet (0.4 mg total) under the tongue every 5 (five) minutes as needed. For chest pain  25 tablet  6  . pantoprazole (PROTONIX) 40 MG tablet Take 40 mg by mouth daily.       . rosuvastatin (CRESTOR) 20 MG tablet Take 20 mg by mouth daily.       . Tamsulosin HCl (FLOMAX) 0.4 MG CAPS Take 0.4 mg by mouth daily.       Marland Kitchen tiotropium (SPIRIVA) 18 MCG inhalation capsule Place 18 mcg into inhaler and inhale as needed (shorness of breath).       No current facility-administered medications for this visit.   Facility-Administered Medications Ordered in Other Visits  Medication Dose Route  Frequency Provider Last Rate Last Dose  . darbepoetin alfa-polysorbate (ARANESP) injection 40 mcg  40 mcg Subcutaneous Q14 Days Irena Cords, MD   40 mcg at 06/19/11 1345    Allergies:    Allergies  Allergen Reactions  . Amlodipine Besy-Benazepril Hcl Swelling    Lips swell  . Percocet [Oxycodone-Acetaminophen] Other (See Comments)    hallucinations    Social History:  The patient  reports that he quit smoking about 14 years ago. His smoking use included Cigarettes. He has a 25 pack-year smoking history. He has never used smokeless  tobacco. He reports that  drinks alcohol. He reports that he does not use illicit drugs.   Family history:   Family History  Problem Relation Age of Onset  . Diabetes Sister     ROS:  Please see the history of present illness.  All other systems reviewed and negative.   PHYSICAL EXAM: VS:  BP 126/78  Pulse 88  Ht 6\' 1"  (1.854 m)  Wt 292 lb (132.45 kg)  BMI 38.53 kg/m2 Well nourished, well developed, in no acute distress HEENT: Pupils are equal round react to light accommodation extraocular movements are intact.  Neck: no JVD Cardiac: Regular rate and rhythm without 1/6 sys murmur, no rubs or gallops. Lungs:  Decreased BS bilaterally.  Slight wheezing LLL, No rhonchi or rales Abd: soft, nontender, positive bowel sounds all quadrants, no hepatosplenomegaly Ext: no lower extremity edema.  2+ radial and dorsalis pedis pulses. Skin: warm and dry Neuro:  Grossly normal      ASSESSMENT AND PLAN:  Problem List Items Addressed This Visit   CORONARY ARTERY DISEASE, CABG 2001, last PCI 7/11 (Chronic)   Chronic diastolic CHF (congestive heart failure) - Primary (Chronic)     Well compensated from a cardiac standpoint.  No signs of acute heart failure.  Decreased oxygen saturation as from a pulmonary etiology.    Followup with Dr. Allyson Sabal in 6 months

## 2013-05-07 ENCOUNTER — Encounter (HOSPITAL_COMMUNITY)
Admission: RE | Admit: 2013-05-07 | Discharge: 2013-05-07 | Disposition: A | Discharge status: Home / Self Care | Payer: Medicare Other | Source: Ambulatory Visit | Attending: Nephrology | Admitting: Nephrology

## 2013-05-07 DIAGNOSIS — D638 Anemia in other chronic diseases classified elsewhere: Secondary | ICD-10-CM | POA: Insufficient documentation

## 2013-05-07 DIAGNOSIS — N183 Chronic kidney disease, stage 3 unspecified: Secondary | ICD-10-CM | POA: Insufficient documentation

## 2013-05-07 LAB — POCT HEMOGLOBIN-HEMACUE: Hemoglobin: 8.7 g/dL — ABNORMAL LOW (ref 13.0–17.0)

## 2013-05-07 MED ORDER — DARBEPOETIN ALFA-POLYSORBATE 100 MCG/0.5ML IJ SOLN
INTRAMUSCULAR | Status: AC
  Filled 2013-05-07: qty 0.5

## 2013-05-07 MED ORDER — DARBEPOETIN ALFA-POLYSORBATE 500 MCG/ML IJ SOLN
100.0000 ug | INTRAMUSCULAR | Status: DC
  Administered 2013-05-07: 100 ug via SUBCUTANEOUS

## 2013-05-08 MED FILL — Darbepoetin Alfa-Polysorbate 80 Soln Inj 100 MCG/0.5ML: INTRAMUSCULAR | Qty: 0.5 | Status: AC

## 2013-05-20 ENCOUNTER — Emergency Department (HOSPITAL_COMMUNITY): Payer: Medicare Other

## 2013-05-20 ENCOUNTER — Inpatient Hospital Stay (HOSPITAL_COMMUNITY)
Admission: EM | Admit: 2013-05-20 | Discharge: 2013-06-12 | DRG: 004 | Disposition: A | Discharge status: Home / Self Care | Payer: Medicare Other | Attending: Pulmonary Disease | Admitting: Pulmonary Disease

## 2013-05-20 DIAGNOSIS — E662 Morbid (severe) obesity with alveolar hypoventilation: Secondary | ICD-10-CM | POA: Diagnosis present

## 2013-05-20 DIAGNOSIS — E1159 Type 2 diabetes mellitus with other circulatory complications: Secondary | ICD-10-CM | POA: Diagnosis present

## 2013-05-20 DIAGNOSIS — N183 Chronic kidney disease, stage 3 unspecified: Secondary | ICD-10-CM | POA: Diagnosis present

## 2013-05-20 DIAGNOSIS — I279 Pulmonary heart disease, unspecified: Secondary | ICD-10-CM | POA: Diagnosis present

## 2013-05-20 DIAGNOSIS — J441 Chronic obstructive pulmonary disease with (acute) exacerbation: Secondary | ICD-10-CM

## 2013-05-20 DIAGNOSIS — I739 Peripheral vascular disease, unspecified: Secondary | ICD-10-CM | POA: Diagnosis present

## 2013-05-20 DIAGNOSIS — Z7982 Long term (current) use of aspirin: Secondary | ICD-10-CM

## 2013-05-20 DIAGNOSIS — Z951 Presence of aortocoronary bypass graft: Secondary | ICD-10-CM

## 2013-05-20 DIAGNOSIS — E870 Hyperosmolality and hypernatremia: Secondary | ICD-10-CM | POA: Diagnosis present

## 2013-05-20 DIAGNOSIS — Z91199 Patient's noncompliance with other medical treatment and regimen due to unspecified reason: Secondary | ICD-10-CM

## 2013-05-20 DIAGNOSIS — E8729 Other acidosis: Secondary | ICD-10-CM | POA: Diagnosis present

## 2013-05-20 DIAGNOSIS — G9340 Encephalopathy, unspecified: Secondary | ICD-10-CM | POA: Diagnosis present

## 2013-05-20 DIAGNOSIS — I9589 Other hypotension: Secondary | ICD-10-CM | POA: Diagnosis not present

## 2013-05-20 DIAGNOSIS — E875 Hyperkalemia: Secondary | ICD-10-CM | POA: Diagnosis not present

## 2013-05-20 DIAGNOSIS — I509 Heart failure, unspecified: Secondary | ICD-10-CM | POA: Diagnosis present

## 2013-05-20 DIAGNOSIS — E872 Acidosis, unspecified: Secondary | ICD-10-CM | POA: Diagnosis not present

## 2013-05-20 DIAGNOSIS — Z8673 Personal history of transient ischemic attack (TIA), and cerebral infarction without residual deficits: Secondary | ICD-10-CM

## 2013-05-20 DIAGNOSIS — F411 Generalized anxiety disorder: Secondary | ICD-10-CM | POA: Diagnosis not present

## 2013-05-20 DIAGNOSIS — N179 Acute kidney failure, unspecified: Secondary | ICD-10-CM | POA: Diagnosis present

## 2013-05-20 DIAGNOSIS — Z79899 Other long term (current) drug therapy: Secondary | ICD-10-CM

## 2013-05-20 DIAGNOSIS — R091 Pleurisy: Secondary | ICD-10-CM | POA: Diagnosis present

## 2013-05-20 DIAGNOSIS — I130 Hypertensive heart and chronic kidney disease with heart failure and stage 1 through stage 4 chronic kidney disease, or unspecified chronic kidney disease: Secondary | ICD-10-CM | POA: Diagnosis present

## 2013-05-20 DIAGNOSIS — R339 Retention of urine, unspecified: Secondary | ICD-10-CM | POA: Diagnosis not present

## 2013-05-20 DIAGNOSIS — I70209 Unspecified atherosclerosis of native arteries of extremities, unspecified extremity: Secondary | ICD-10-CM | POA: Diagnosis present

## 2013-05-20 DIAGNOSIS — D649 Anemia, unspecified: Secondary | ICD-10-CM | POA: Diagnosis present

## 2013-05-20 DIAGNOSIS — Z9861 Coronary angioplasty status: Secondary | ICD-10-CM

## 2013-05-20 DIAGNOSIS — E861 Hypovolemia: Secondary | ICD-10-CM | POA: Diagnosis present

## 2013-05-20 DIAGNOSIS — B370 Candidal stomatitis: Secondary | ICD-10-CM | POA: Diagnosis not present

## 2013-05-20 DIAGNOSIS — Z87891 Personal history of nicotine dependence: Secondary | ICD-10-CM

## 2013-05-20 DIAGNOSIS — J9611 Chronic respiratory failure with hypoxia: Secondary | ICD-10-CM | POA: Diagnosis present

## 2013-05-20 DIAGNOSIS — G4733 Obstructive sleep apnea (adult) (pediatric): Secondary | ICD-10-CM | POA: Diagnosis present

## 2013-05-20 DIAGNOSIS — D696 Thrombocytopenia, unspecified: Secondary | ICD-10-CM

## 2013-05-20 DIAGNOSIS — R0689 Other abnormalities of breathing: Secondary | ICD-10-CM

## 2013-05-20 DIAGNOSIS — I5032 Chronic diastolic (congestive) heart failure: Secondary | ICD-10-CM | POA: Diagnosis present

## 2013-05-20 DIAGNOSIS — Z930 Tracheostomy status: Secondary | ICD-10-CM

## 2013-05-20 DIAGNOSIS — J9601 Acute respiratory failure with hypoxia: Secondary | ICD-10-CM

## 2013-05-20 DIAGNOSIS — M109 Gout, unspecified: Secondary | ICD-10-CM

## 2013-05-20 DIAGNOSIS — I119 Hypertensive heart disease without heart failure: Secondary | ICD-10-CM | POA: Diagnosis present

## 2013-05-20 DIAGNOSIS — J9610 Chronic respiratory failure, unspecified whether with hypoxia or hypercapnia: Secondary | ICD-10-CM

## 2013-05-20 DIAGNOSIS — J449 Chronic obstructive pulmonary disease, unspecified: Secondary | ICD-10-CM | POA: Diagnosis present

## 2013-05-20 DIAGNOSIS — Z6837 Body mass index (BMI) 37.0-37.9, adult: Secondary | ICD-10-CM

## 2013-05-20 DIAGNOSIS — I2510 Atherosclerotic heart disease of native coronary artery without angina pectoris: Secondary | ICD-10-CM | POA: Diagnosis present

## 2013-05-20 DIAGNOSIS — E785 Hyperlipidemia, unspecified: Secondary | ICD-10-CM | POA: Diagnosis present

## 2013-05-20 DIAGNOSIS — E119 Type 2 diabetes mellitus without complications: Secondary | ICD-10-CM | POA: Diagnosis present

## 2013-05-20 DIAGNOSIS — Z9119 Patient's noncompliance with other medical treatment and regimen: Secondary | ICD-10-CM

## 2013-05-20 DIAGNOSIS — J9620 Acute and chronic respiratory failure, unspecified whether with hypoxia or hypercapnia: Secondary | ICD-10-CM

## 2013-05-20 DIAGNOSIS — N189 Chronic kidney disease, unspecified: Secondary | ICD-10-CM

## 2013-05-20 DIAGNOSIS — I498 Other specified cardiac arrhythmias: Secondary | ICD-10-CM | POA: Diagnosis not present

## 2013-05-20 DIAGNOSIS — D638 Anemia in other chronic diseases classified elsewhere: Secondary | ICD-10-CM | POA: Diagnosis present

## 2013-05-20 DIAGNOSIS — K219 Gastro-esophageal reflux disease without esophagitis: Secondary | ICD-10-CM | POA: Diagnosis present

## 2013-05-20 DIAGNOSIS — R042 Hemoptysis: Secondary | ICD-10-CM | POA: Diagnosis not present

## 2013-05-20 LAB — URINALYSIS, ROUTINE W REFLEX MICROSCOPIC
Ketones, ur: NEGATIVE mg/dL
Nitrite: NEGATIVE
Protein, ur: >300 mg/dL — AB
Urobilinogen, UA: 1 mg/dL (ref 0.0–1.0)
pH: 5 (ref 5.0–8.0)

## 2013-05-20 LAB — CBC WITH DIFFERENTIAL/PLATELET
Basophils Absolute: 0 10*3/uL (ref 0.0–0.1)
Basophils Relative: 0 % (ref 0–1)
HCT: 36.7 % — ABNORMAL LOW (ref 39.0–52.0)
Hemoglobin: 9.8 g/dL — ABNORMAL LOW (ref 13.0–17.0)
Lymphocytes Relative: 13 % (ref 12–46)
Lymphs Abs: 0.7 10*3/uL (ref 0.7–4.0)
MCH: 27.7 pg (ref 26.0–34.0)
MCHC: 26.7 g/dL — ABNORMAL LOW (ref 30.0–36.0)
Monocytes Absolute: 0.6 10*3/uL (ref 0.1–1.0)
Monocytes Relative: 11 % (ref 3–12)
Neutro Abs: 4.1 10*3/uL (ref 1.7–7.7)
Platelets: 102 10*3/uL — ABNORMAL LOW (ref 150–400)
RBC: 3.54 MIL/uL — ABNORMAL LOW (ref 4.22–5.81)

## 2013-05-20 LAB — COMPREHENSIVE METABOLIC PANEL
Albumin: 3.7 g/dL (ref 3.5–5.2)
Alkaline Phosphatase: 80 U/L (ref 39–117)
BUN: 28 mg/dL — ABNORMAL HIGH (ref 6–23)
CO2: >45 mEq/L (ref 19–32)
Chloride: 97 mEq/L (ref 96–112)
Creatinine, Ser: 1.61 mg/dL — ABNORMAL HIGH (ref 0.50–1.35)
GFR calc Af Amer: 52 mL/min — ABNORMAL LOW (ref >90)
GFR calc non Af Amer: 45 mL/min — ABNORMAL LOW (ref >90)
Glucose, Bld: 160 mg/dL — ABNORMAL HIGH (ref 70–99)
Potassium: 5.1 mEq/L (ref 3.5–5.1)
Total Bilirubin: 0.4 mg/dL (ref 0.3–1.2)

## 2013-05-20 LAB — PROTIME-INR: INR: 0.86 (ref 0.00–1.49)

## 2013-05-20 LAB — URINE MICROSCOPIC-ADD ON

## 2013-05-20 LAB — PRO B NATRIURETIC PEPTIDE: Pro B Natriuretic peptide (BNP): 821.6 pg/mL — ABNORMAL HIGH (ref 0–125)

## 2013-05-20 MED ORDER — PANTOPRAZOLE SODIUM 40 MG IV SOLR
40.0000 mg | INTRAVENOUS | Status: DC
  Administered 2013-05-22 – 2013-05-23 (×2): 40 mg via INTRAVENOUS
  Filled 2013-05-20 (×3): qty 40

## 2013-05-20 MED ORDER — ALBUTEROL SULFATE HFA 108 (90 BASE) MCG/ACT IN AERS
6.0000 | INHALATION_SPRAY | RESPIRATORY_TRACT | Status: DC
  Administered 2013-05-21 – 2013-05-23 (×16): 6 via RESPIRATORY_TRACT
  Filled 2013-05-20 (×2): qty 6.7

## 2013-05-20 MED ORDER — PROPOFOL 10 MG/ML IV EMUL
INTRAVENOUS | Status: AC
  Filled 2013-05-20: qty 100

## 2013-05-20 MED ORDER — PROPOFOL 10 MG/ML IV EMUL
5.0000 ug/kg/min | INTRAVENOUS | Status: DC
  Administered 2013-05-21: 10 ug/kg/min via INTRAVENOUS
  Administered 2013-05-21: 30 ug/kg/min via INTRAVENOUS
  Administered 2013-05-21: 10 ug/kg/min via INTRAVENOUS
  Administered 2013-05-21: 30 ug/kg/min via INTRAVENOUS
  Administered 2013-05-22: 5 ug/kg/min via INTRAVENOUS
  Filled 2013-05-20 (×5): qty 100

## 2013-05-20 MED ORDER — NALOXONE HCL 1 MG/ML IJ SOLN
2.0000 mg | Freq: Once | INTRAMUSCULAR | Status: AC
  Administered 2013-05-20: 2 mg via INTRAVENOUS

## 2013-05-20 MED ORDER — COLCHICINE 0.6 MG PO TABS: 0.6000 mg | ORAL_TABLET | Freq: Every day | ORAL | Status: DC

## 2013-05-20 MED ORDER — NALOXONE HCL 1 MG/ML IJ SOLN
INTRAMUSCULAR | Status: AC
  Filled 2013-05-20: qty 2

## 2013-05-20 MED ORDER — PROPOFOL 10 MG/ML IV EMUL
5.0000 ug/kg/min | Freq: Once | INTRAVENOUS | Status: AC
  Administered 2013-05-20: 10 ug/kg/min via INTRAVENOUS

## 2013-05-20 MED ORDER — ATORVASTATIN CALCIUM 40 MG PO TABS
40.0000 mg | ORAL_TABLET | Freq: Every day | ORAL | Status: DC
  Administered 2013-05-21 – 2013-05-22 (×2): 40 mg via ORAL
  Filled 2013-05-20 (×3): qty 1

## 2013-05-20 MED ORDER — ASPIRIN EC 81 MG PO TBEC
81.0000 mg | DELAYED_RELEASE_TABLET | Freq: Every day | ORAL | Status: DC
  Administered 2013-05-21 – 2013-05-22 (×2): 81 mg via ORAL
  Filled 2013-05-20 (×3): qty 1

## 2013-05-20 MED ORDER — METHYLPREDNISOLONE SODIUM SUCC 125 MG IJ SOLR
80.0000 mg | Freq: Three times a day (TID) | INTRAMUSCULAR | Status: DC
  Administered 2013-05-21: 80 mg via INTRAVENOUS
  Filled 2013-05-20 (×4): qty 1.28

## 2013-05-20 MED ORDER — SUCCINYLCHOLINE CHLORIDE 20 MG/ML IJ SOLN
INTRAMUSCULAR | Status: DC | PRN
  Administered 2013-05-20: 150 mg via INTRAVENOUS

## 2013-05-20 MED ORDER — IPRATROPIUM BROMIDE HFA 17 MCG/ACT IN AERS
6.0000 | INHALATION_SPRAY | RESPIRATORY_TRACT | Status: DC
  Administered 2013-05-21 – 2013-05-23 (×16): 6 via RESPIRATORY_TRACT
  Filled 2013-05-20 (×2): qty 12.9

## 2013-05-20 MED ORDER — FENTANYL CITRATE 0.05 MG/ML IJ SOLN
100.0000 ug | Freq: Once | INTRAMUSCULAR | Status: AC
  Administered 2013-05-20: 100 ug via INTRAVENOUS
  Filled 2013-05-20: qty 2

## 2013-05-20 MED ORDER — HEPARIN SODIUM (PORCINE) 5000 UNIT/ML IJ SOLN
5000.0000 [IU] | Freq: Three times a day (TID) | INTRAMUSCULAR | Status: DC
  Administered 2013-05-21 – 2013-05-22 (×5): 5000 [IU] via SUBCUTANEOUS
  Filled 2013-05-20 (×7): qty 1

## 2013-05-20 MED ORDER — ALBUTEROL SULFATE HFA 108 (90 BASE) MCG/ACT IN AERS: 6.0000 | INHALATION_SPRAY | RESPIRATORY_TRACT | Status: DC | PRN

## 2013-05-20 MED ORDER — CHLORHEXIDINE GLUCONATE 0.12 % MT SOLN: 15.0000 mL | Freq: Two times a day (BID) | OROMUCOSAL | Status: DC

## 2013-05-20 MED ORDER — BIOTENE DRY MOUTH MT LIQD
1.0000 "application " | Freq: Four times a day (QID) | OROMUCOSAL | Status: DC
  Administered 2013-05-21 – 2013-06-12 (×88): 15 mL via OROMUCOSAL

## 2013-05-20 MED ORDER — CLOPIDOGREL BISULFATE 75 MG PO TABS
75.0000 mg | ORAL_TABLET | Freq: Every day | ORAL | Status: DC
  Administered 2013-05-21 – 2013-05-22 (×2): 75 mg via ORAL
  Filled 2013-05-20 (×3): qty 1

## 2013-05-20 MED ORDER — MIDAZOLAM HCL 2 MG/2ML IJ SOLN
2.0000 mg | Freq: Once | INTRAMUSCULAR | Status: AC
  Administered 2013-05-20: 2 mg via INTRAVENOUS
  Filled 2013-05-20: qty 2

## 2013-05-20 MED ORDER — METOPROLOL TARTRATE 25 MG PO TABS: 12.5000 mg | ORAL_TABLET | Freq: Two times a day (BID) | ORAL | Status: DC

## 2013-05-20 MED ORDER — MIDAZOLAM HCL 2 MG/2ML IJ SOLN
2.0000 mg | Freq: Once | INTRAMUSCULAR | Status: AC
Start: 2013-05-20 — End: 2013-05-20
  Administered 2013-05-20: 2 mg via INTRAVENOUS
  Filled 2013-05-20: qty 2

## 2013-05-20 MED ORDER — ETOMIDATE 2 MG/ML IV SOLN
INTRAVENOUS | Status: DC | PRN
  Administered 2013-05-20: 10 mg via INTRAVENOUS

## 2013-05-20 MED ORDER — ISOSORBIDE MONONITRATE ER 60 MG PO TB24: 60.0000 mg | ORAL_TABLET | Freq: Every day | ORAL | Status: DC

## 2013-05-20 MED ORDER — SODIUM CHLORIDE 0.9 % IV BOLUS (SEPSIS)
1000.0000 mL | Freq: Once | INTRAVENOUS | Status: AC
  Administered 2013-05-20: 1000 mL via INTRAVENOUS

## 2013-05-20 MED ORDER — TAMSULOSIN HCL 0.4 MG PO CAPS: 0.4000 mg | ORAL_CAPSULE | Freq: Every day | ORAL | Status: DC

## 2013-05-20 NOTE — ED Provider Notes (Signed)
CSN: [id number]     Arrival date & time 05/20/13  2155 History   First MD Initiated Contact with Patient 05/20/13 2204     Chief Complaint  Patient presents with  . Shortness of Breath   (Consider location/radiation/quality/duration/timing/severity/associated sxs/prior Treatment) Patient is a 62 y.o. male presenting with shortness of breath. The history is provided by the EMS personnel, a relative and medical records. The history is limited by the condition of the patient (altered mental status).  Shortness of Breath Severity:  Severe Onset quality:  Gradual Duration:  2 days Timing:  Constant Progression:  Worsening Chronicity:  Recurrent Relieved by:  Nothing Ineffective treatments:  Oxygen Associated symptoms: diaphoresis   Associated symptoms: no abdominal pain, no chest pain, no cough, no sore throat, no syncope, no vomiting and no wheezing   Risk factors: obesity   Risk factors: no family hx of DVT and no hx of PE/DVT     Past Medical History  Diagnosis Date  . Coronary atherosclerosis of unspecified type of vessel, native or graft   . Chronic pulmonary heart disease, unspecified   . Chronic respiratory failure   . Unspecified sleep apnea   . Chronic airway obstruction, not elsewhere classified   . Angina   . Shortness of breath   . Anemia   . Chronic kidney disease   . Anxiety   . Hypertension   . CHF (congestive heart failure)   . GERD (gastroesophageal reflux disease)   . History of gout   . Chronic diastolic CHF (congestive heart failure) 10/28/2007    Hospitalized with diastolic CHF controlled with diuresis and BiPAP 2D April 2012 EF 55-60 with severe LVH and grade 1 diastolic dysfunction    . 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 PVD (peripheral vascular disease) 07/13/2011    S/P Lt SFA PTA Sept 2009, known occluded Rt SFA   . Obstructive sleep apnea 10/28/2007    BiPAP 13/10 with O2 3 L Advanced.      . Swelling of limb     Venous Duplex, 09/03/2012 - no evidence of thrombus or thrombophelitis  . S/P CABG (coronary artery bypass graft)     Nuc, 09/17/2012 - Significant ST abnormalities that are suggestive of ischemia, low risk nuclear study  . TIA (transient ischemic attack)     2D Echo, 12/05/2010 - EF 55-60%, severe LVH  . PVD (peripheral vascular disease)   . COPD (chronic obstructive pulmonary disease)   . OSA (obstructive sleep apnea)   . Chronic renal insufficiency, stage III (moderate)   . Hyperlipidemia    Past Surgical History  Procedure Laterality Date  . Coronary artery bypass graft  07/12/2000    x6,  . Appendectomy    . Bilateral vats ablation    . Coronary angioplasty with stent placement    . Hemmorhoids    . Lung sx  2005    Growth on outside of right lung  . Esophagogastroduodenoscopy  08/11/2011    Procedure: ESOPHAGOGASTRODUODENOSCOPY (EGD);  Surgeon: Barrie Folk, MD;  Location: [facility] ENDOSCOPY;  Service: Endoscopy;  Laterality: N/A;  . Colonoscopy  08/11/2011    Procedure: COLONOSCOPY;  Surgeon: Barrie Folk, MD;  Location: [facility] ENDOSCOPY;  Service: Endoscopy;  Laterality: N/A;  . Cataract extraction    . Ptca  04/16/2008    Left SFA-60% stenosis stented with a 7x6 Nitinolself-expanding stent resulting in reduction of 60% mid segmental L SFA stenosis to 0%  residual  . Cardiac catheterization  02/16/2010    RCA-80% stenosis stented with a 3x12 Taxus ION DES resulting in reduction of a total occlusion to 0%  . Cardiac catheterization  03/29/2009    Ostial/Proximal PDA-SVG-75% stenosis stented with a 3.0x23 Xience V DES reduction resulting in 75% stenosis to 0%  . Cardiac catheterization  08/05/2004    Circumflex OM stented with a 2.5x13 Cypher stent resulting in reduction of 60% to 0%  . Cardiac catheterization  06/28/2000    Moderate left main and severe three-vessel disease, consider CABG  . Abdominal aortogram  12/03/2002    70% stenosis at the origin of the profunda femoris, 75% mid  superficial femoral artery stenosis   Family History  Problem Relation Age of Onset  . Diabetes Sister    History  Substance Use Topics  . Smoking status: Former Smoker -- 1.00 packs/day for 25 years    Types: Cigarettes    Quit date: 08/14/1998  . Smokeless tobacco: Never Used  . Alcohol Use: Yes     Comment: social    Review of Systems  Unable to perform ROS: Acuity of condition  Constitutional: Positive for diaphoresis.  HENT: Negative for sore throat.   Respiratory: Positive for shortness of breath. Negative for cough, chest tightness and wheezing.   Cardiovascular: Negative for chest pain and syncope.  Gastrointestinal: Negative for vomiting, abdominal pain, diarrhea and constipation.  Genitourinary: Negative for dysuria and difficulty urinating.    Allergies  Amlodipine besy-benazepril hcl and Percocet  Home Medications   Current Outpatient Rx  Name  Route  Sig  Dispense  Refill  . albuterol (PROVENTIL HFA;VENTOLIN HFA) 108 (90 BASE) MCG/ACT inhaler   Inhalation   Inhale 2 puffs into the lungs every 6 (six) hours as needed. For wheezing         . albuterol (PROVENTIL) (2.5 MG/3ML) 0.083% nebulizer solution   Nebulization   Take 3 mLs (2.5 mg total) by nebulization every 6 (six) hours.   75 mL   12   . aspirin EC 81 MG tablet   Oral   Take 81 mg by mouth daily.         . budesonide (PULMICORT) 0.25 MG/2ML nebulizer solution   Nebulization   Take 2 mLs (0.25 mg total) by nebulization 2 (two) times daily. Dx: 496   120 mL   5   . clopidogrel (PLAVIX) 75 MG tablet   Oral   Take 1 tablet (75 mg total) by mouth daily.   30 tablet   11   . COLCRYS 0.6 MG tablet   Oral   Take 0.6 mg by mouth daily.          . furosemide (LASIX) 40 MG tablet   Oral   Take 1 tablet (40 mg total) by mouth 2 (two) times daily. May take an additional 40 mg if he has additional edema.   60 tablet   3   . isosorbide mononitrate (IMDUR) 60 MG 24 hr tablet   Oral    Take 1 tablet (60 mg total) by mouth daily.   90 tablet   3   . metoprolol tartrate (LOPRESSOR) 25 MG tablet   Oral   Take 0.5 tablets (12.5 mg total) by mouth 2 (two) times daily.   30 tablet   10   . mometasone (NASONEX) 50 MCG/ACT nasal spray   Nasal   Place 2 sprays into the nose as needed (allergies).         Marland Kitchen  Multiple Vitamin (MULITIVITAMIN WITH MINERALS) TABS   Oral   Take 1 tablet by mouth daily.         . nitroGLYCERIN (NITROSTAT) 0.4 MG SL tablet   Sublingual   Place 1 tablet (0.4 mg total) under the tongue every 5 (five) minutes as needed. For chest pain   25 tablet   6   . pantoprazole (PROTONIX) 40 MG tablet   Oral   Take 40 mg by mouth daily.          . rosuvastatin (CRESTOR) 20 MG tablet   Oral   Take 1 tablet (20 mg total) by mouth daily.   56 tablet   0     Dispense as written.    Lot ck [id number] Exp 08/2015   . Tamsulosin HCl (FLOMAX) 0.4 MG CAPS   Oral   Take 0.4 mg by mouth daily.          Marland Kitchen tiotropium (SPIRIVA) 18 MCG inhalation capsule   Inhalation   Place 18 mcg into inhaler and inhale as needed (shorness of breath).          BP 87/53  Pulse 75  Resp 26  Ht 6' 0.84" (1.85 m)  Wt 292 lb 1.8 oz (132.5 kg)  BMI 38.71 kg/m2  SpO2 100% Physical Exam  Nursing note and vitals reviewed. Constitutional: He appears well-developed. He appears distressed.  Minimally responsive male, opening eyes to pain but otherwise with shallow breaths and non-responsive. Arrives via EMS on a non-rebreather.  HENT:  Head: Normocephalic and atraumatic.  Mouth/Throat: Oropharynx is clear and moist. No oropharyngeal exudate.  Eyes: Conjunctivae and EOM are normal. Pupils are equal, round, and reactive to light.  Cardiovascular: Normal rate and regular rhythm.  Exam reveals no gallop and no friction rub.   No murmur heard. CABG sternotomy scar well healed  Pulmonary/Chest: He is in respiratory distress. He has decreased breath sounds. He has no wheezes.  He has no rhonchi. He has no rales.  Shallow breaths  Abdominal: Soft. He exhibits no mass. There is no rebound and no guarding.  Obese; midline hernia easily reducible  Neurological: He is unresponsive. GCS eye subscore is 2. GCS verbal subscore is 1. GCS motor subscore is 4.  Skin: Skin is dry. He is not diaphoretic.    ED Course  INTUBATION Date/Time: 05/20/2013 10:30 PM Performed by: Dorna Leitz Authorized by: Dorna Leitz Consent: The procedure was performed in an emergent situation. Indications: respiratory distress and airway protection Intubation method: direct Patient status: paralyzed (RSI) Preoxygenation: BVM Sedatives: etomidate Paralytic: succinylcholine Laryngoscope size: Mac 4 Tube size: 7.5 mm Tube type: cuffed Number of attempts: 2 (initial attempt with miller 2 - too much intraoral tissue to secure a view) Ventilation between attempts: BVM Cricoid pressure: no Cords visualized: yes Post-procedure assessment: chest rise and ETCO2 monitor Breath sounds: equal Cuff inflated: yes ETT to teeth: 24 cm Tube secured with: ETT holder Chest x-ray findings: endotracheal tube in appropriate position Patient tolerance: Patient tolerated the procedure well with no immediate complications.   (including critical care time) Labs Review Labs Reviewed  CBC WITH DIFFERENTIAL - Abnormal; Notable for the following:    RBC 3.54 (*)    Hemoglobin 9.8 (*)    HCT 36.7 (*)    MCV 103.7 (*)    MCHC 26.7 (*)    Platelets 102 (*)    All other components within normal limits  COMPREHENSIVE METABOLIC PANEL - Abnormal; Notable for the following:  Sodium 148 (*)    CO2 >45 (*)    Glucose, Bld 160 (*)    BUN 28 (*)    Creatinine, Ser 1.61 (*)    GFR calc non Af Amer 45 (*)    GFR calc Af Amer 52 (*)    All other components within normal limits  PRO B NATRIURETIC PEPTIDE - Abnormal; Notable for the following:    Pro B Natriuretic peptide (BNP) 821.6 (*)    All other  components within normal limits  URINALYSIS, ROUTINE W REFLEX MICROSCOPIC - Abnormal; Notable for the following:    APPearance CLOUDY (*)    Glucose, UA 100 (*)    Hgb urine dipstick MODERATE (*)    Bilirubin Urine SMALL (*)    Protein, ur >300 (*)    Leukocytes, UA MODERATE (*)    All other components within normal limits  URINE MICROSCOPIC-ADD ON - Abnormal; Notable for the following:    Squamous Epithelial / LPF MANY (*)    All other components within normal limits  URINE CULTURE  PROTIME-INR  TROPONIN I  TROPONIN I  TROPONIN I  LACTIC ACID, PLASMA  LACTIC ACID, PLASMA  LACTIC ACID, PLASMA  MAGNESIUM  PHOSPHORUS   Imaging Review Dg Chest Port 1 View  05/20/2013   CLINICAL DATA:  Endotracheal tube placement.  EXAM: PORTABLE CHEST - 1 VIEW  COMPARISON:  None.  FINDINGS: The patient's endotracheal tube is seen ending 2-3 cm above the carina. The enteric tube is noted extending below the diaphragm.  The lungs are relatively well expanded. Mild vascular congestion is noted. A small left pleural effusion is seen, with mild left basilar atelectasis. No pneumothorax is identified.  The cardiomediastinal silhouette is enlarged. The patient is status post median sternotomy, with evidence of prior CABG. No acute osseous abnormalities are identified.  IMPRESSION: 1. Endotracheal tube seen ending 2-3 cm above the carina. 2. Small left pleural effusion, with left basilar atelectasis. 3. Cardiomegaly and mild vascular congestion.   Electronically Signed   By: Roanna Raider M.D.   On: 05/20/2013 23:08     Date: 05/20/2013  Rate: 83  Rhythm: normal sinus rhythm  QRS Axis: normal  Intervals: QT prolonged  ST/T Wave abnormalities: normal  Conduction Disutrbances:right bundle branch block  Narrative Interpretation:   Old EKG Reviewed: unchanged   MDM   1. Acute-on-chronic respiratory failure   2. Morbid obesity    3. Hypercarbia   4. COPD exacerbation   5. Respiratory acidosis   6.  Anemia, past Hx GI bleed 5/12   7. CORONARY ARTERY DISEASE, CABG 2001, last PCI 7/11     The patient is a 62 year old male with a history of coronary artery disease status post CABG, CHF, COPD, obesity hypoventilation syndrome, chronic respiratory failure on 4 L home oxygen who presents with altered mental status. Wife states that he has been somewhat normal over the last week but declining in his respiratory function. Yesterday he was normal and interactive, however today he has become increasingly somnolent, nonverbal. On EMS arrival, patient was somnolent, nonverbal, requiring assisted ventilations with bag valve mask.  On arrival to the ED, patient will intermittently open his eyes to pain, however he is nonverbal, not following commands. Concern about the patient's ability to protect his airway. ABG obtained and show significant hypercarbia with CO2 greater than 130. Feel this is likely etiology the patient's altered mental status. No signs of PE, ACS, afebrile and no signs of infection. EKG without signs of  ischemia.The patient was emergently intubated for decreased ventilation as well as mental status. Chest x-ray shows appropriate positioning of the endotracheal tube. Labs and urinalysis ordered for evaluation. Will consult intensive care for ICU level of admission. Propofol and intermittent fentanyl for sedation. Two peripherals placed. CXR shows no consolidation, effusion, or pneumothorax.   Patient was discussed with my attending, Dr. Hyacinth Meeker.    Dorna Leitz, MD 05/20/13 2356

## 2013-05-20 NOTE — ED Notes (Signed)
Et tube 7.5 placed at 23 at lips.  Confirmed by lungs sounds and color change cap

## 2013-05-20 NOTE — ED Notes (Signed)
Pt presents from EMS with SOB and AMS

## 2013-05-20 NOTE — ED Provider Notes (Signed)
62 year old male with a history of present your COPD who has recently been intubated because of acute respiratory failure secondary to hypercapnia and hypoxia. According to his wife he has had a gradual worsening of his symptoms over the last week, she notes that he had a rather lucid day yesterday but today became even worse with shortness of breath and then became significantly altered through the evening. Paramedics found the patient to have increased altered mental status, they did have to use bag-valve-mask ventilation, with a nasal trumpet. This allowed the patient oxygenate however his altered mental status was such that his decline prevented him from talking, giving any information or participating with his care. A level V caveat apply secondary to the severity of illness.  The spouse gives most of the information as well as the medical record which suggests that he has had hypercapnia respiratory failure in the past.  On exam the patient is obese, has a nasal trumpet placed, oropharynx is clear, he has upper dentures which I have removed. His mucous members are moist. He is breathing spontaneously but rather slowly, he is not tachycardic, he is requiring a nonrebreather for oxygenation at this time. His lung sounds are decreased, he has prolonged expiratory phase with slight wheezing but significantly decreased lung sounds. He has bilateral lower extremity peripheral edema which is 1+ bilaterally with minimal asymmetry. His abdomen is soft, nondistended, he has no guarding. His mental status is significantly depressed, he does not talk, he does not follow commands, he does have a corneal reflex and gag reflex.  No response to Narcan, patient required rapid sequence intubation with succinylcholine and etomidate  The patient is critically ill with likely recurrent acute on chronic respiratory failure which is likely hypercapnic in source. The ABG which was performed on the patient's arrival showed that  he had a hypercapnic acidosis, this is consistent with his history and clinical presentation. The patient required intubation immediately to secure his airway and because of his depressed mental status. Chest x-ray, labs to continue the workup with the patient will need intensive care unit admission to the hospital.  CRITICAL CARE Performed by: Vida Roller Total critical care time: 35 Critical care time was exclusive of separately billable procedures and treating other patients. Critical care was necessary to treat or prevent imminent or life-threatening deterioration. Critical care was time spent personally by me on the following activities: development of treatment plan with patient and/or surrogate as well as nursing, discussions with consultants, evaluation of patient's response to treatment, examination of patient, obtaining history from patient or surrogate, ordering and performing treatments and interventions, ordering and review of laboratory studies, ordering and review of radiographic studies, pulse oximetry and re-evaluation of patient's condition.   I saw and evaluated the patient, reviewed the resident's note and I agree with the findings and plan.  I was personally present and directly supervised the following procedures as performed by the resident:  Intubation   Diagnosis  #1 acute on chronic respiratory failure  #2 hypercapnic respiratory failure   I personally interpreted the EKG as well as the resident and agree with the interpretation on the resident's chart.   Vida Roller, MD 05/21/13 [phone]

## 2013-05-20 NOTE — H&P (Signed)
PULMONARY  / CRITICAL CARE MEDICINE  Name: [name] [name] MRN: [id number] DOB: 11/11/1950    ADMISSION DATE:  05/20/2013 CONSULTATION DATE:  05/20/2013  REFERRING MD :  EDP PRIMARY SERVICE:  PCCM  CHIEF COMPLAINT:  Dyspnea  BRIEF PATIENT DESCRIPTION: 62 yo with past medical history of COPD, OSA, OHS and recent intubation for acute respiratory failure brought to ED with progressive dyspnea and acute encephalopathy.  In ED intubated for airway protection.  PCCM was consulted.  SIGNIFICANT EVENTS / STUDIES:   LINES / TUBES: OETT 10/7 >>> OGT 10/7 >>> Foley 10/7 >>>  CULTURES: 10/7 Urine >>>  ANTIBIOTICS: Levaquin 10/7 >>>  The patient is encephalopathic and unable to provide history, which was obtained for available medical records.  HISTORY OF PRESENT ILLNESS:  62 yo with past medical history of COPD, OSA, OHS and recent intubation for acute respiratory failure brought to ED with progressive dyspnea and acute encephalopathy.  In ED intubated for airway protection.  PCCM was consulted.  PAST MEDICAL HISTORY :  Past Medical History  Diagnosis Date  . Coronary atherosclerosis of unspecified type of vessel, native or graft   . Chronic pulmonary heart disease, unspecified   . Chronic respiratory failure   . Unspecified sleep apnea   . Chronic airway obstruction, not elsewhere classified   . Angina   . Shortness of breath   . Anemia   . Chronic kidney disease   . Anxiety   . Hypertension   . CHF (congestive heart failure)   . GERD (gastroesophageal reflux disease)   . History of gout   . Chronic diastolic CHF (congestive heart failure) 10/28/2007    Hospitalized with diastolic CHF controlled with diuresis and BiPAP 2D April 2012 EF 55-60 with severe LVH and grade 1 diastolic dysfunction    . 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 PVD (peripheral vascular disease) 07/13/2011    S/P Lt SFA PTA Sept 2009, known occluded Rt SFA   .  Obstructive sleep apnea 10/28/2007    BiPAP 13/10 with O2 3 L Advanced.      . Swelling of limb     Venous Duplex, 09/03/2012 - no evidence of thrombus or thrombophelitis  . S/P CABG (coronary artery bypass graft)     Nuc, 09/17/2012 - Significant ST abnormalities that are suggestive of ischemia, low risk nuclear study  . TIA (transient ischemic attack)     2D Echo, 12/05/2010 - EF 55-60%, severe LVH  . PVD (peripheral vascular disease)   . COPD (chronic obstructive pulmonary disease)   . OSA (obstructive sleep apnea)   . Chronic renal insufficiency, stage III (moderate)   . Hyperlipidemia    Past Surgical History  Procedure Laterality Date  . Coronary artery bypass graft  07/12/2000    x6,  . Appendectomy    . Bilateral vats ablation    . Coronary angioplasty with stent placement    . Hemmorhoids    . Lung sx  2005    Growth on outside of right lung  . Esophagogastroduodenoscopy  08/11/2011    Procedure: ESOPHAGOGASTRODUODENOSCOPY (EGD);  Surgeon: Barrie Folk, MD;  Location: [facility] ENDOSCOPY;  Service: Endoscopy;  Laterality: N/A;  . Colonoscopy  08/11/2011    Procedure: COLONOSCOPY;  Surgeon: Barrie Folk, MD;  Location: [facility] ENDOSCOPY;  Service: Endoscopy;  Laterality: N/A;  . Cataract extraction    . Ptca  04/16/2008    Left SFA-60% stenosis stented with  a 7x6 Nitinolself-expanding stent resulting in reduction of 60% mid segmental L SFA stenosis to 0% residual  . Cardiac catheterization  02/16/2010    RCA-80% stenosis stented with a 3x12 Taxus ION DES resulting in reduction of a total occlusion to 0%  . Cardiac catheterization  03/29/2009    Ostial/Proximal PDA-SVG-75% stenosis stented with a 3.0x23 Xience V DES reduction resulting in 75% stenosis to 0%  . Cardiac catheterization  08/05/2004    Circumflex OM stented with a 2.5x13 Cypher stent resulting in reduction of 60% to 0%  . Cardiac catheterization  06/28/2000    Moderate left main and severe three-vessel disease, consider CABG  .  Abdominal aortogram  12/03/2002    70% stenosis at the origin of the profunda femoris, 75% mid superficial femoral artery stenosis   Prior to Admission medications   Medication Sig Start Date End Date Taking? Authorizing Provider  albuterol (PROVENTIL HFA;VENTOLIN HFA) 108 (90 BASE) MCG/ACT inhaler Inhale 2 puffs into the lungs every 6 (six) hours as needed. For wheezing    Historical Provider, MD  albuterol (PROVENTIL) (2.5 MG/3ML) 0.083% nebulizer solution Take 3 mLs (2.5 mg total) by nebulization every 6 (six) hours. 01/30/13 01/30/14  Jeanella Craze, NP  aspirin EC 81 MG tablet Take 81 mg by mouth daily.    Historical Provider, MD  budesonide (PULMICORT) 0.25 MG/2ML nebulizer solution Take 2 mLs (0.25 mg total) by nebulization 2 (two) times daily. Dx: 496 12/02/12   Tammy S Parrett, NP  clopidogrel (PLAVIX) 75 MG tablet Take 1 tablet (75 mg total) by mouth daily. 03/17/13   Runell Gess, MD  COLCRYS 0.6 MG tablet Take 0.6 mg by mouth daily.  01/02/11   Historical Provider, MD  furosemide (LASIX) 40 MG tablet Take 1 tablet (40 mg total) by mouth 2 (two) times daily. May take an additional 40 mg if he has additional edema. 01/30/13   Jeanella Craze, NP  isosorbide mononitrate (IMDUR) 60 MG 24 hr tablet Take 1 tablet (60 mg total) by mouth daily. 03/13/13   Runell Gess, MD  metoprolol tartrate (LOPRESSOR) 25 MG tablet Take 0.5 tablets (12.5 mg total) by mouth 2 (two) times daily. 02/10/13   Runell Gess, MD  mometasone (NASONEX) 50 MCG/ACT nasal spray Place 2 sprays into the nose as needed (allergies).    Historical Provider, MD  Multiple Vitamin (MULITIVITAMIN WITH MINERALS) TABS Take 1 tablet by mouth daily.    Historical Provider, MD  nitroGLYCERIN (NITROSTAT) 0.4 MG SL tablet Place 1 tablet (0.4 mg total) under the tongue every 5 (five) minutes as needed. For chest pain 01/20/13   Runell Gess, MD  pantoprazole (PROTONIX) 40 MG tablet Take 40 mg by mouth daily.  12/18/10   Historical  Provider, MD  rosuvastatin (CRESTOR) 20 MG tablet Take 1 tablet (20 mg total) by mouth daily. 04/25/13   Wilburt Finlay, PA-C  Tamsulosin HCl (FLOMAX) 0.4 MG CAPS Take 0.4 mg by mouth daily.  12/16/10   Historical Provider, MD  tiotropium (SPIRIVA) 18 MCG inhalation capsule Place 18 mcg into inhaler and inhale as needed (shorness of breath).    Historical Provider, MD   Allergies  Allergen Reactions  . Amlodipine Besy-Benazepril Hcl Swelling    Lips swell  . Percocet [Oxycodone-Acetaminophen] Other (See Comments)    hallucinations   FAMILY HISTORY:  Family History  Problem Relation Age of Onset  . Diabetes Sister    SOCIAL HISTORY:  reports that he quit smoking about  14 years ago. His smoking use included Cigarettes. He has a 25 pack-year smoking history. He has never used smokeless tobacco. He reports that  drinks alcohol. He reports that he does not use illicit drugs.  REVIEW OF SYSTEMS:  Unable to provide.  INTERVAL HISTORY:  VITAL SIGNS: Pulse Rate:  [75-102] 75 (10/07 2327) Resp:  [13-26] 26 (10/07 2327) BP: (84-153)/(26-96) 87/53 mmHg (10/07 2327) SpO2:  [87 %-100 %] 100 % (10/07 2327) FiO2 (%):  [30 %] 30 % (10/07 2158) Weight:  [132.5 kg (292 lb 1.8 oz)] 132.5 kg (292 lb 1.8 oz) (10/07 2158)  HEMODYNAMICS:   VENTILATOR SETTINGS: Vent Mode:  [-] PRVC FiO2 (%):  [30 %] 30 % Set Rate:  [26 bmp] 26 bmp Vt Set:  [600 mL] 600 mL PEEP:  [5 cmH20] 5 cmH20 Plateau Pressure:  [32 cmH20] 32 cmH20  INTAKE / OUTPUT: Intake/Output     10/07 0701 - 10/08 0700   Urine (mL/kg/hr) 200   Total Output 200   Net -200        PHYSICAL EXAMINATION: General:  Appears acutely ill, mechanically ventilated, synchronous Neuro:  Encephalopathic, nonfocal, cough / gag diminished HEENT:  PERRL, OETT / OGT Cardiovascular:  RRR, no m/r/g Lungs:  Bilateral diminished air entry, no added sounds Abdomen:  Soft, nontender, bowel sounds diminished Musculoskeletal:  Moves all extremities, no  edema Skin:  Intact  LABS:  Recent Labs Lab 05/20/13 2225 05/20/13 2305 05/20/13 2351  HGB 9.8*  --   --   WBC 5.5  --   --   PLT 102*  --   --   NA 148*  --   --   K 5.1  --   --   CL 97  --   --   CO2 >45*  --   --   GLUCOSE 160*  --   --   BUN 28*  --   --   CREATININE 1.61*  --   --   CALCIUM 9.4  --   --   MG 2.3  --   --   PHOS 5.9*  --   --   AST 18  --   --   ALT 12  --   --   ALKPHOS 80  --   --   BILITOT 0.4  --   --   PROT 7.7  --   --   ALBUMIN 3.7  --   --   INR  --  0.86  --   LATICACIDVEN  --   --  0.3*  TROPONINI <0.30  --   --   PROBNP 821.6*  --   --    No results found for this basename: GLUCAP,  in the last 168 hours  CXR:  10/7 >>> Hardware in place no overt airspace disease  ASSESSMENT / PLAN:  PULMONARY A:  Acute on chronic respiratory failure.  AECOPD.  OSA/OHS. P:   Gaol SpO2>92, pH>7.30 Full mechanical support Daily SBT Ventilator bundle Trend ABG / CXR Albuterol / Atrovent Solu-Medrol Hold Spiriva, Pulmicort  CARDIOVASCULAR A: CAD s/p CABG.  Chronic diastolic CHF without of exacerbation.  Transiently hypotensive after intubation. P:  Goal MAP>60 Continue ASA, Plavix, Lipitor Hold Metoprolol, Imdur Trend troponin / lactate TTE  RENAL A:  Acute on chronic renal failure.  Hypernatremia.  Hypovolemia. P:   Goal CVP>10 Trend BMP NS 1000 x 1 NS@100  Hold Lasix  GASTROINTESTINAL A:  No active issues. P:   NPO as intubated  TF if remains intubated > 24 hours Protonix for GI Px  HEMATOLOGIC A:  Anemia, likely chronic. P:  Trend CBC Heparin for DVT Px  INFECTIOUS A:  No evidence of acute infection. P:   Empirical antibiotics for AECOPD PCT  ENDOCRINE  A:  DM2. Hyperlipidemia. P:   SSI  NEUROLOGIC A:  Acute encephalopathy. P:   Goal RASS 0 to -1 Propofol gtt  I have personally obtained history, examined patient, evaluated and interpreted laboratory and imaging results, reviewed medical records,  formulated assessment / plan and placed orders.  CRITICAL CARE:  The patient is critically ill with multiple organ systems failure and requires high complexity decision making for assessment and support, frequent evaluation and titration of therapies, application of advanced monitoring technologies and extensive interpretation of multiple databases. Critical Care Time devoted to patient care services described in this note is 60 minutes.   [name], MD Pulmonary and Critical Care Medicine [facility] Pager: [phone]  05/20/2013, 11:37 PM

## 2013-05-21 DIAGNOSIS — I359 Nonrheumatic aortic valve disorder, unspecified: Secondary | ICD-10-CM

## 2013-05-21 DIAGNOSIS — G9340 Encephalopathy, unspecified: Secondary | ICD-10-CM

## 2013-05-21 DIAGNOSIS — Z6841 Body Mass Index (BMI) 40.0 and over, adult: Secondary | ICD-10-CM

## 2013-05-21 LAB — BLOOD GAS, ARTERIAL
Acid-Base Excess: 25.1 mmol/L — ABNORMAL HIGH (ref 0.0–2.0)
Bicarbonate: 50.3 mEq/L — ABNORMAL HIGH (ref 20.0–24.0)
Drawn by: 35235
MECHVT: 450 mL
O2 Saturation: 93 %
PEEP: 5 cmH2O
Patient temperature: 98.6
TCO2: 52 mmol/L (ref 0–100)
pH, Arterial: 7.589 — ABNORMAL HIGH (ref 7.350–7.450)

## 2013-05-21 LAB — GLUCOSE, CAPILLARY
Glucose-Capillary: 126 mg/dL — ABNORMAL HIGH (ref 70–99)
Glucose-Capillary: 89 mg/dL (ref 70–99)
Glucose-Capillary: 116 mg/dL — ABNORMAL HIGH (ref 70–99)
Glucose-Capillary: 159 mg/dL — ABNORMAL HIGH (ref 70–99)
Glucose-Capillary: 116 mg/dL — ABNORMAL HIGH (ref 70–99)
Glucose-Capillary: 113 mg/dL — ABNORMAL HIGH (ref 70–99)

## 2013-05-21 LAB — POCT I-STAT 3, ART BLOOD GAS (G3+)
O2 Saturation: 82 %
Patient temperature: 34.6
TCO2: >50 mmol/L (ref 0–100)
pCO2 arterial: 61.8 mmHg (ref 35.0–45.0)

## 2013-05-21 LAB — URINALYSIS, ROUTINE W REFLEX MICROSCOPIC
Nitrite: NEGATIVE
Protein, ur: 30 mg/dL — AB
Urobilinogen, UA: 1 mg/dL (ref 0.0–1.0)

## 2013-05-21 LAB — TROPONIN I
Troponin I: <0.3 ng/mL (ref <0.30)
Troponin I: <0.3 ng/mL (ref <0.30)
Troponin I: <0.3 ng/mL (ref <0.30)

## 2013-05-21 LAB — LACTIC ACID, PLASMA
Lactic Acid, Venous: 1.2 mmol/L (ref 0.5–2.2)
Lactic Acid, Venous: 1.1 mmol/L (ref 0.5–2.2)

## 2013-05-21 LAB — URINE MICROSCOPIC-ADD ON

## 2013-05-21 LAB — POCT I-STAT TROPONIN I: Troponin i, poc: 0 ng/mL (ref 0.00–0.08)

## 2013-05-21 MED ORDER — LEVOFLOXACIN IN D5W 750 MG/150ML IV SOLN
750.0000 mg | Freq: Once | INTRAVENOUS | Status: AC
  Administered 2013-05-21: 750 mg via INTRAVENOUS
  Filled 2013-05-21: qty 150

## 2013-05-21 MED ORDER — CHLORHEXIDINE GLUCONATE 0.12 % MT SOLN
15.0000 mL | Freq: Two times a day (BID) | OROMUCOSAL | Status: DC
  Administered 2013-05-21 – 2013-06-12 (×45): 15 mL via OROMUCOSAL
  Filled 2013-05-21 (×48): qty 15

## 2013-05-21 MED ORDER — PRO-STAT SUGAR FREE PO LIQD
60.0000 mL | Freq: Every day | ORAL | Status: DC
  Administered 2013-05-21 – 2013-05-22 (×8): 60 mL via ORAL
  Filled 2013-05-21 (×14): qty 60

## 2013-05-21 MED ORDER — INFLUENZA VAC SPLIT QUAD 0.5 ML IM SUSP
0.5000 mL | INTRAMUSCULAR | Status: AC
  Administered 2013-05-22: 0.5 mL via INTRAMUSCULAR
  Filled 2013-05-21: qty 0.5

## 2013-05-21 MED ORDER — FREE WATER
200.0000 mL | Freq: Three times a day (TID) | Status: DC
  Administered 2013-05-21 – 2013-05-27 (×17): 200 mL

## 2013-05-21 MED ORDER — VITAL AF 1.2 CAL PO LIQD
1000.0000 mL | ORAL | Status: DC
  Filled 2013-05-21 (×3): qty 1000

## 2013-05-21 MED ORDER — VITAL HIGH PROTEIN PO LIQD
1000.0000 mL | ORAL | Status: DC
  Administered 2013-05-21: 1000 mL
  Filled 2013-05-21 (×2): qty 1000

## 2013-05-21 MED ORDER — LEVOFLOXACIN IN D5W 750 MG/150ML IV SOLN: 750.0000 mg | INTRAVENOUS | Status: DC

## 2013-05-21 MED ORDER — SODIUM CHLORIDE 0.9 % IV SOLN
INTRAVENOUS | Status: DC
  Administered 2013-05-21: 03:00:00 via INTRAVENOUS

## 2013-05-21 MED ORDER — INSULIN ASPART 100 UNIT/ML ~~LOC~~ SOLN
0.0000 [IU] | SUBCUTANEOUS | Status: DC
  Administered 2013-05-21: 3 [IU] via SUBCUTANEOUS
  Administered 2013-05-21 – 2013-05-23 (×7): 2 [IU] via SUBCUTANEOUS

## 2013-05-21 MED ORDER — SODIUM CHLORIDE 0.9 % IV BOLUS (SEPSIS)
1000.0000 mL | Freq: Once | INTRAVENOUS | Status: AC
  Administered 2013-05-21: 1000 mL via INTRAVENOUS

## 2013-05-21 NOTE — ED Notes (Signed)
Per MD order, pt placed on defibrillation Pads

## 2013-05-21 NOTE — Progress Notes (Signed)
Utilization Review Completed.   Roston Grunewald, RN, BSN Nurse Case Manager  [phone]  

## 2013-05-21 NOTE — Significant Event (Signed)
ABG    Component Value Date/Time   PHART 7.589* 05/21/2013 0330   PCO2ART 52.4* 05/21/2013 0330   PO2ART 48.0* 05/21/2013 0330   HCO3 50.3* 05/21/2013 0330   TCO2 52.0 05/21/2013 0330   O2SAT 93.0 05/21/2013 0330    Pt not breathing over set vent rate.  Reduce RR from 24 to 14.  Coralyn Helling, MD 05/21/2013, 5:29 AM

## 2013-05-21 NOTE — Progress Notes (Signed)
Vent changes made by Blima Dessert, MD.

## 2013-05-21 NOTE — Progress Notes (Signed)
Echocardiogram 2D Echocardiogram has been performed.  [name] [name] 05/21/2013, 11:48 AM

## 2013-05-21 NOTE — ED Provider Notes (Signed)
I saw and evaluated the patient, reviewed the resident's note and I agree with the findings and plan.  Please see my separate note regarding my evaluation of the patient.    Vida Roller, MD 05/21/13 [phone]

## 2013-05-21 NOTE — Progress Notes (Signed)
Foley removed around 0830. Pt was scanned around 1500 due to no urinary output. Bladder scan showed 643 cc of urine. Pt was in and out cathed for 400 cc. Will continue to monitor.

## 2013-05-21 NOTE — Progress Notes (Signed)
INITIAL NUTRITION ASSESSMENT  DOCUMENTATION CODES Per approved criteria  -Obesity Unspecified   INTERVENTION: 1.  Enteral nutrition; initiate Vital HP @ 15 mL/hr continuous  with Prostat 10 times daily to provide 1360 kcal, 181g protein, 298 mL free water.  Nutrition provision with TF + diprivan = 1565 kcal, 181g protein   NUTRITION DIAGNOSIS: Inadequate oral intake related to inability to eat as evidenced by vent, NPO.   Monitor:  1.  Enteral nutrition; initiation with tolerance.  Enteral nutrition to provide 60-70% of estimated calorie needs (22-25 kcals/kg ideal body weight) and 100% of estimated protein needs, based on ASPEN guidelines for permissive underfeeding in critically ill obese individuals. 2.  Wt/wt change; monitor trends  Reason for Assessment: vent  62 y.o. male  Admitting Dx: dyspnea  ASSESSMENT: Pt with history of COPD, OSA, and OHS, and recent intubation for acute respiratory failure admitted with dyspnea.   Patient is currently intubated on ventilator support.  MV: 8.1 L/min Temp:Temp (24hrs), Avg:100.2 F (37.9 C), Min:99.2 F (37.3 C), Max:101.3 F (38.5 C)  Propofol: 7.8 ml/hr which provides 205 kcal/d.  Discussed with RN, no expected changes.   No family at bedside.  Review of wt hx shows recent wt loss, however pt wt was </=290 lbs approximately 1 year ago.   Nutrition Focused Physical Exam:  Subcutaneous Fat:  Orbital Region: WNL Upper Arm Region: WNL Thoracic and Lumbar Region: WNL  Muscle:  Temple Region: WNL Clavicle Bone Region: WNL Clavicle and Acromion Bone Region: WNL Scapular Bone Region: mild wasting Dorsal Hand: WNL Patellar Region: WNL Anterior Thigh Region: WNL Posterior Calf Region: mild wasting  Edema: none present  Height: Ht Readings from Last 1 Encounters:  05/20/13 6' 0.83" (1.85 m)    Weight: Wt Readings from Last 1 Encounters:  05/20/13 292 lb 1.8 oz (132.5 kg)    Ideal Body Weight: 80.9 kg  % Ideal  Body Weight: 163%  Wt Readings from Last 10 Encounters:  05/20/13 292 lb 1.8 oz (132.5 kg)  04/25/13 292 lb (132.45 kg)  04/11/13 316 lb 12.8 oz (143.7 kg)  04/01/13 290 lb 12.6 oz (131.9 kg)  03/13/13 302 lb 9.6 oz (137.258 kg)  02/05/13 303 lb (137.44 kg)  01/27/13 356 lb 4.2 oz (161.6 kg)  12/02/12 320 lb 9.6 oz (145.423 kg)  11/17/12 328 lb 14.4 oz (149.188 kg)  11/08/12 327 lb 9.6 oz (148.598 kg)    Usual Body Weight: variable, 300-328 lbs over the past 1 year  % Usual Body Weight: 97%  BMI:  Body mass index is 38.71 kg/(m^2).  Estimated Nutritional Needs: Kcal: 2221 Permissive underfeeding:  1330-1560 Protein: 178g Fluid: ~2.2 L/day  Skin: intact  Diet Order: NPO  EDUCATION NEEDS: -Education needs addressed   Intake/Output Summary (Last 24 hours) at 05/21/13 1058 Last data filed at 05/21/13 0800  Gross per 24 hour  Intake 596.73 ml  Output    660 ml  Net -63.27 ml    Last BM: PTA  Labs:   Recent Labs Lab 05/20/13 2225  NA 148*  K 5.1  CL 97  CO2 >45*  BUN 28*  CREATININE 1.61*  CALCIUM 9.4  MG 2.3  PHOS 5.9*  GLUCOSE 160*    CBG (last 3)   Recent Labs  05/21/13 0102 05/21/13 0349 05/21/13 0755  GLUCAP 126* 123* 89    Scheduled Meds: . albuterol  6 puff Inhalation Q4H  . antiseptic oral rinse  1 application Mouth Rinse QID  . aspirin EC  81 mg Oral Daily  . atorvastatin  40 mg Oral q1800  . chlorhexidine  15 mL Mouth/Throat BID  . clopidogrel  75 mg Oral Daily  . free water  200 mL Per Tube Q8H  . heparin subcutaneous  5,000 Units Subcutaneous Q8H  . insulin aspart  0-15 Units Subcutaneous Q4H  . ipratropium  6 puff Inhalation Q4H  . methylPREDNISolone (SOLU-MEDROL) injection  80 mg Intravenous Q8H  . pantoprazole (PROTONIX) IV  40 mg Intravenous Q24H  . propofol        Continuous Infusions: . sodium chloride 10 mL/hr (05/21/13 0956)  . feeding supplement (VITAL AF 1.2 CAL)    . propofol 10 mcg/kg/min (05/21/13 1610)     Past Medical History  Diagnosis Date  . Coronary atherosclerosis of unspecified type of vessel, native or graft   . Chronic pulmonary heart disease, unspecified   . Chronic respiratory failure   . Unspecified sleep apnea   . Chronic airway obstruction, not elsewhere classified   . Angina   . Shortness of breath   . Anemia   . Chronic kidney disease   . Anxiety   . Hypertension   . CHF (congestive heart failure)   . GERD (gastroesophageal reflux disease)   . History of gout   . Chronic diastolic CHF (congestive heart failure) 10/28/2007    Hospitalized with diastolic CHF controlled with diuresis and BiPAP 2D April 2012 EF 55-60 with severe LVH and grade 1 diastolic dysfunction    . 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 PVD (peripheral vascular disease) 07/13/2011    S/P Lt SFA PTA Sept 2009, known occluded Rt SFA   . Obstructive sleep apnea 10/28/2007    BiPAP 13/10 with O2 3 L Advanced.      . Swelling of limb     Venous Duplex, 09/03/2012 - no evidence of thrombus or thrombophelitis  . S/P CABG (coronary artery bypass graft)     Nuc, 09/17/2012 - Significant ST abnormalities that are suggestive of ischemia, low risk nuclear study  . TIA (transient ischemic attack)     2D Echo, 12/05/2010 - EF 55-60%, severe LVH  . PVD (peripheral vascular disease)   . COPD (chronic obstructive pulmonary disease)   . OSA (obstructive sleep apnea)   . Chronic renal insufficiency, stage III (moderate)   . Hyperlipidemia     Past Surgical History  Procedure Laterality Date  . Coronary artery bypass graft  07/12/2000    x6,  . Appendectomy    . Bilateral vats ablation    . Coronary angioplasty with stent placement    . Hemmorhoids    . Lung sx  2005    Growth on outside of right lung  . Esophagogastroduodenoscopy  08/11/2011    Procedure: ESOPHAGOGASTRODUODENOSCOPY (EGD);  Surgeon: Barrie Folk, MD;  Location: [facility] ENDOSCOPY;  Service: Endoscopy;   Laterality: N/A;  . Colonoscopy  08/11/2011    Procedure: COLONOSCOPY;  Surgeon: Barrie Folk, MD;  Location: [facility] ENDOSCOPY;  Service: Endoscopy;  Laterality: N/A;  . Cataract extraction    . Ptca  04/16/2008    Left SFA-60% stenosis stented with a 7x6 Nitinolself-expanding stent resulting in reduction of 60% mid segmental L SFA stenosis to 0% residual  . Cardiac catheterization  02/16/2010    RCA-80% stenosis stented with a 3x12 Taxus ION DES resulting in reduction of a total occlusion to 0%  . Cardiac catheterization  03/29/2009  Ostial/Proximal PDA-SVG-75% stenosis stented with a 3.0x23 Xience V DES reduction resulting in 75% stenosis to 0%  . Cardiac catheterization  08/05/2004    Circumflex OM stented with a 2.5x13 Cypher stent resulting in reduction of 60% to 0%  . Cardiac catheterization  06/28/2000    Moderate left main and severe three-vessel disease, consider CABG  . Abdominal aortogram  12/03/2002    70% stenosis at the origin of the profunda femoris, 75% mid superficial femoral artery stenosis    Loyce Dys, MS RD LDN Clinical Inpatient Dietitian Pager: [phone] Weekend/After hours pager: [phone]

## 2013-05-21 NOTE — Progress Notes (Signed)
eLink Physician-Brief Progress Note Patient Name: [name] [name] DOB: 1950-12-25 MRN: [id number]  Date of Service  05/21/2013   HPI/Events of Note    Pt with bladder fullness, oliguria  eICU Interventions  Place foley   Intervention Category Intermediate Interventions: Other: Bladder full  Shan Levans 05/21/2013, 10:21 PM

## 2013-05-21 NOTE — Progress Notes (Signed)
Vent changes made per order Craige Cotta, MD.

## 2013-05-21 NOTE — Progress Notes (Signed)
PULMONARY  / CRITICAL CARE MEDICINE  Name: [name] [name] MRN: [id number] DOB: Jan 31, 1951    ADMISSION DATE:  05/20/2013 CONSULTATION DATE:  05/20/2013  REFERRING MD :  EDP PRIMARY SERVICE:  PCCM  CHIEF COMPLAINT:  Dyspnea  BRIEF PATIENT DESCRIPTION: 62 yo with past medical history of chronic severe restrictive pulmonary secondary to obesity and pleural fibrosis (previously reported as COPD), OSA with poor BiPAP compliance at home, OHS and recent intubation for acute respiratory failure brought to ED with progressive dyspnea and acute encephalopathy.  In ED intubated for airway protection.  PCCM was consulted.  SIGNIFICANT EVENTS / STUDIES:  10/7, early AM 10/8 - intubated in the ED, admitted to ICU  LINES / TUBES: OETT 10/7 >>> OGT 10/7 >>> Foley 10/7 >>>  CULTURES: 10/7 Urine >>>  ANTIBIOTICS: Levaquin 10/7 >>> 10/7  SUBJECTIVE: Intubated, sedated, but able to answer some simple questions  VITAL SIGNS: Temp:  [99.2 F (37.3 C)-101.3 F (38.5 C)] 100.9 F (38.3 C) (10/08 0800) Pulse Rate:  [73-107] 99 (10/08 1000) Resp:  [13-26] 23 (10/08 1000) BP: (70-153)/(26-116) 123/72 mmHg (10/08 1000) SpO2:  [87 %-100 %] 99 % (10/08 1000) FiO2 (%):  [30 %-45 %] 40 % (10/08 1000) Weight:  [292 lb 1.8 oz (132.5 kg)] 292 lb 1.8 oz (132.5 kg) (10/07 2158)  HEMODYNAMICS:   VENTILATOR SETTINGS: Vent Mode:  [-] PRVC FiO2 (%):  [30 %-45 %] 40 % Set Rate:  [14 bmp-26 bmp] 14 bmp Vt Set:  [450 mL-600 mL] 450 mL PEEP:  [5 cmH20] 5 cmH20 Plateau Pressure:  [26 cmH20-36 cmH20] 26 cmH20  INTAKE / OUTPUT: Intake/Output     10/07 0701 - 10/08 0700 10/08 0701 - 10/09 0700   I.V. (mL/kg) 472.8 (3.6) 123.9 (0.9)   Total Intake(mL/kg) 472.8 (3.6) 123.9 (0.9)   Urine (mL/kg/hr) 600 60 (0.1)   Total Output 600 60   Net -127.2 +63.9         PHYSICAL EXAMINATION: General:  Appears acutely ill, mechanically ventilated, synchronous Neuro:  Encephalopathic, nonfocal, cough / gag  diminished HEENT:  PERRL, OETT / OGT in place Cardiovascular:  RRR, no m/r/g Lungs:  Bilateral diminished breath sounds esp at bases Abdomen:  Soft, nontender, bowel sounds present Musculoskeletal:  Moves all extremities Skin:  Intact  LABS:  Recent Labs Lab 05/20/13 2225 05/20/13 2305 05/20/13 2351 05/21/13 0330 05/21/13 0600  HGB 9.8*  --   --   --   --   WBC 5.5  --   --   --   --   PLT 102*  --   --   --   --   NA 148*  --   --   --   --   K 5.1  --   --   --   --   CL 97  --   --   --   --   CO2 >45*  --   --   --   --   GLUCOSE 160*  --   --   --   --   BUN 28*  --   --   --   --   CREATININE 1.61*  --   --   --   --   CALCIUM 9.4  --   --   --   --   MG 2.3  --   --   --   --   PHOS 5.9*  --   --   --   --  AST 18  --   --   --   --   ALT 12  --   --   --   --   ALKPHOS 80  --   --   --   --   BILITOT 0.4  --   --   --   --   PROT 7.7  --   --   --   --   ALBUMIN 3.7  --   --   --   --   INR  --  0.86  --   --   --   LATICACIDVEN  --   --  0.3*  --  1.2  TROPONINI <0.30  --   --   --  <0.30  PROBNP 821.6*  --   --   --   --   PHART  --   --   --  7.589*  --   PCO2ART  --   --   --  52.4*  --   PO2ART  --   --   --  48.0*  --     Recent Labs Lab 05/21/13 0102 05/21/13 0349 05/21/13 0755  GLUCAP 126* 123* 89    CXR:  10/7 >>> Hardware in place no overt airspace disease   ASSESSMENT / PLAN:  PULMONARY A:  Acute on chronic respiratory failure.   Chronic restrictive pulmonary disease - hx of chronic pleural thickening, obesity.  AECOPD - less likely given known restrictive pathology OSA/OHS with poor BiPAP compliance at home. P:   May need to consider elective tracheostomy pending ability to extubate D/c'd Levoquin - see ID Goal SpO2>92, pH>7.30 Continue full mechanical support, daily SBT Trend ABG / CXR Albuterol / Atrovent PRN Solu-Medrol Hold home Spiriva, Pulmicort  CARDIOVASCULAR A: CAD s/p CABG.   Chronic diastolic CHF without of  exacerbation.   Transiently hypotensive after intubation - BP now improved P:  Goal MAP>60 Continue ASA, Plavix, Lipitor Hold Metoprolol, Imdur - consider restart as BP tolerates Trend troponin / lactate TTE pending  KVO fluids  RENAL A:  Acute on chronic renal failure.   Hypernatremia.   Hypovolemia - s/p NS bolus. P:   Goal CVP>10 Trend BMP Hold Lasix, KVO fluids  GASTROINTESTINAL A:  No active issues. P:   Start tube feeds plus free water 200 mL q8h Protonix for GI Px  HEMATOLOGIC A:  Anemia, chronic likely related to multiple chronic illness. P:  Trend CBC Heparin for DVT Px  INFECTIOUS A: Febrile but no evidence of acute nidus of infection. P:   D/c empiric Levoquin as less likely true COPD Monitor for other s/s of infection PCT <0.11  ENDOCRINE  A:  DM2. Hyperlipidemia. P:   SSI  NEUROLOGIC A:  Acute encephalopathy - likely related to acute on chronic resp failure. P:   Goal RASS 0 to -1 Propofol gtt  Please see also pending completion / revision / addendum by attending Dr. Sung Amabile.  Bobbye Morton, MD PGY-2, [facility] 05/21/2013, 11:13 AM  I have interviewed and examined the patient and reviewed the database. I have formulated the assessment and plan as reflected in the note above with amendments made by me. 40 mins of direct critical care time provided  I know this man very well having been involved in his care intermittently over the past ten yrs or so. He does not have significant obstructive lung disease. He has chronic ventilatory failure due to severe restrictive physiology on  the basis of obesity and pleural fibrosis (sequela of CABG). He has lost substantial weight over the years but has not reaped a great deal of benefit in terms of chronic ventilatory failure. Given repeated and frequent hospitalizations for this problem, a tracheostomy tube might be indicated  Billy Fischer, MD;  PCCM service; Mobile [phone]

## 2013-05-22 ENCOUNTER — Inpatient Hospital Stay (HOSPITAL_COMMUNITY): Payer: Medicare Other

## 2013-05-22 DIAGNOSIS — R0609 Other forms of dyspnea: Secondary | ICD-10-CM

## 2013-05-22 DIAGNOSIS — I798 Other disorders of arteries, arterioles and capillaries in diseases classified elsewhere: Secondary | ICD-10-CM

## 2013-05-22 DIAGNOSIS — E1159 Type 2 diabetes mellitus with other circulatory complications: Secondary | ICD-10-CM

## 2013-05-22 LAB — URINE CULTURE

## 2013-05-22 LAB — BLOOD GAS, ARTERIAL
Drawn by: 352351
MECHVT: 450 mL
O2 Saturation: 97.7 %
PEEP: 5 cmH2O
RATE: 14 resp/min
pCO2 arterial: 82.7 mmHg (ref 35.0–45.0)
pH, Arterial: 7.374 (ref 7.350–7.450)
pO2, Arterial: 93.7 mmHg (ref 80.0–100.0)

## 2013-05-22 LAB — GLUCOSE, CAPILLARY
Glucose-Capillary: 103 mg/dL — ABNORMAL HIGH (ref 70–99)
Glucose-Capillary: 133 mg/dL — ABNORMAL HIGH (ref 70–99)

## 2013-05-22 LAB — BASIC METABOLIC PANEL
CO2: >45 mEq/L (ref 19–32)
Calcium: 9.3 mg/dL (ref 8.4–10.5)
Sodium: 146 mEq/L — ABNORMAL HIGH (ref 135–145)

## 2013-05-22 LAB — CBC
MCV: 98.8 fL (ref 78.0–100.0)
Platelets: 95 10*3/uL — ABNORMAL LOW (ref 150–400)
RBC: 3.2 MIL/uL — ABNORMAL LOW (ref 4.22–5.81)
WBC: 6.6 10*3/uL (ref 4.0–10.5)

## 2013-05-22 LAB — TRIGLYCERIDES: Triglycerides: 105 mg/dL (ref <150)

## 2013-05-22 MED ORDER — DEXTROSE 5 % IV SOLN
INTRAVENOUS | Status: DC
  Administered 2013-05-22 – 2013-05-23 (×2): via INTRAVENOUS
  Administered 2013-05-28: 1 mL via INTRAVENOUS
  Administered 2013-05-30: 1000 mL via INTRAVENOUS

## 2013-05-22 MED ORDER — FUROSEMIDE 10 MG/ML IJ SOLN
INTRAMUSCULAR | Status: AC
  Filled 2013-05-22: qty 4

## 2013-05-22 MED ORDER — FUROSEMIDE 10 MG/ML IJ SOLN
40.0000 mg | Freq: Once | INTRAMUSCULAR | Status: AC
  Administered 2013-05-22: 40 mg via INTRAVENOUS

## 2013-05-22 NOTE — Progress Notes (Signed)
PULMONARY  / CRITICAL CARE MEDICINE  Name: [name] [name] MRN: [id number] DOB: 09-02-1950    ADMISSION DATE:  05/20/2013 CONSULTATION DATE:  05/20/2013  REFERRING MD :  EDP PRIMARY SERVICE:  PCCM  CHIEF COMPLAINT:  Dyspnea  BRIEF PATIENT DESCRIPTION: 62 yo with past medical history of chronic severe restrictive pulmonary secondary to obesity and pleural fibrosis (previously reported as COPD), OSA with poor BiPAP compliance at home, OHS and recent intubation for acute respiratory failure brought to ED with progressive dyspnea and acute encephalopathy.  In ED intubated for airway protection.  PCCM was consulted.  SIGNIFICANT EVENTS / STUDIES:  10/7, early AM 10/8 - intubated in the ED, admitted to ICU 10/8 - stable on vent, 2D echo >>> LA and LV mildly dilated, mild LVH, EF 55-60%, grade 1 diastolic dysfunction, septum with abnormal function/dyssynergy, mild AS, calcified mitral annulus 10/9 - urinary retention overnight, Foley replaced; ENT consult for tracheostomy eval  LINES / TUBES: OETT 10/7 >>> OGT 10/7 >>> Foley 10/7 >>> 10/7, replaced 10/8  CULTURES: 10/7 Urine >>> 25k CFU/mL yeast  ANTIBIOTICS: Levaquin 10/7 >>> 10/7  SUBJECTIVE: Intubated/synchronous, sedated, but able to answer some simple questions. Denies pain/discomfort.  VITAL SIGNS: Temp:  [98.5 F (36.9 C)-100.9 F (38.3 C)] 98.5 F (36.9 C) (10/09 0400) Pulse Rate:  [70-102] 73 (10/09 0700) Resp:  [14-25] 23 (10/09 0700) BP: (110-145)/(41-83) 138/62 mmHg (10/09 0700) SpO2:  [97 %-100 %] 100 % (10/09 0700) FiO2 (%):  [40 %] 40 % (10/09 0413) Weight:  [293 lb 3.4 oz (133 kg)] 293 lb 3.4 oz (133 kg) (10/09 0424)  HEMODYNAMICS:   VENTILATOR SETTINGS: Vent Mode:  [-] PRVC FiO2 (%):  [40 %] 40 % Set Rate:  [14 bmp-16 bmp] 14 bmp Vt Set:  [450 mL] 450 mL PEEP:  [5 cmH20] 5 cmH20 Plateau Pressure:  [22 cmH20-29 cmH20] 25 cmH20  INTAKE / OUTPUT: Intake/Output     10/08 0701 - 10/09 0700 10/09 0701 -  10/10 0700   I.V. (mL/kg) 556.1 (4.2)    Other 100    NG/GT 710    Total Intake(mL/kg) 1366.1 (10.3)    Urine (mL/kg/hr) 2188 (0.7)    Total Output 2188     Net -821.9           PHYSICAL EXAMINATION: General:  Appears acutely ill, mechanically ventilated, synchronous Neuro:  Sedated but awakes to stimuli, responds to questions/simple commands, RASS -1 HEENT:  PERRL, OETT / OGT in place Cardiovascular:  RRR, no m/r/g Lungs:  Bilateral diminished breath sounds esp at bases Abdomen:  Soft, nontender, bowel sounds present Musculoskeletal:  Moves all extremities Skin:  Intact  LABS:  Recent Labs Lab 05/20/13 2225  05/20/13 2305 05/20/13 2351 05/21/13 0006 05/21/13 0330 05/21/13 0600 05/21/13 1225 05/22/13 0530  HGB 9.8*  --   --   --   --   --   --   --   --   WBC 5.5  --   --   --   --   --   --   --   --   PLT 102*  --   --   --   --   --   --   --   --   NA 148*  --   --   --   --   --   --   --   --   K 5.1  --   --   --   --   --   --   --   --  CL 97  --   --   --   --   --   --   --   --   CO2 >45*  --   --   --   --   --   --   --   --   GLUCOSE 160*  --   --   --   --   --   --   --   --   BUN 28*  --   --   --   --   --   --   --   --   CREATININE 1.61*  --   --   --   --   --   --   --   --   CALCIUM 9.4  --   --   --   --   --   --   --   --   MG 2.3  --   --   --   --   --   --   --   --   PHOS 5.9*  --   --   --   --   --   --   --   --   AST 18  --   --   --   --   --   --   --   --   ALT 12  --   --   --   --   --   --   --   --   ALKPHOS 80  --   --   --   --   --   --   --   --   BILITOT 0.4  --   --   --   --   --   --   --   --   PROT 7.7  --   --   --   --   --   --   --   --   ALBUMIN 3.7  --   --   --   --   --   --   --   --   INR  --   --  0.86  --   --   --   --   --   --   LATICACIDVEN  --   < >  --  0.3*  --   --  1.2 1.1  --   TROPONINI <0.30  --   --   --   --   --  <0.30 <0.30  --   PROBNP 821.6*  --   --   --   --   --   --   --   --    PHART  --   --   --   --  7.516* 7.589*  --   --  7.374  PCO2ART  --   --   --   --  61.8* 52.4*  --   --  82.7*  PO2ART  --   --   --   --  39.0* 48.0*  --   --  93.7  < > = values in this interval not displayed.  Recent Labs Lab 05/21/13 1531 05/21/13 1915 05/21/13 1952 05/21/13 2347 05/22/13 0401  GLUCAP 159* 116* 113* 132* 103*    CXR:  10/9 - left effusion/atelectasis little change  ASSESSMENT / PLAN:  PULMONARY A:  Acute on chronic  respiratory failure - intubated Chronic restrictive pulmonary disease - hx of chronic pleural thickening, obesity.  Unlikely AECOPD  given known restrictive pathology OSA/OHS with poor BiPAP compliance at home. P:   Goal SpO2>92, pH>7.30 Continue full mechanical support, daily SBT Trend ABG / CXR Continue Albuterol / Atrovent PRN, Solu-Medrol Hold home Spiriva, Pulmicort ENT consult today for tracheostomy eval Lasix 40 mg x2 today  CARDIOVASCULAR A: CAD s/p CABG.   Chronic diastolic CHF without frank exacerbation  Transiently hypotensive after intubation - BP now improved Troponins negative P:  Goal MAP>60 Continue ASA, Plavix, Lipitor Hold Metoprolol, Imdur - consider restart as BP tolerates TTE as above - known grade 1 diastolic dysfunction, ?new septal abnormality Lasix as above  RENAL A:  Acute on chronic renal failure - Cr rise 1.6 > 2   Urinary retention after Foley out 10/8 --> improved s/p in-and-out cath. Hypernatremia - persistent but mildly improved, likely free water deficit/intracellular dehydration Hypovolemia - improved, s/p NS bolus P:   Goal CVP>10 Trend BMP Continue free water per tube D5W at 100 mL/h Lasix as above Monitor fluid balance - negative so far prior to Lasix  GASTROINTESTINAL A:  No active issues. P:   Continue tube feeds plus free water 200 mL q8h Re-evaluate free water pending labs this AM Protonix for GI Px  HEMATOLOGIC A:  Anemia, chronic likely related to multiple chronic  illness. Hb drop 9.8 to 8.7 likely dilutional (previous levels ~8-9) Thrombocytopenia - uncertain cause, chronic and present on admission, no obvious bleed P:  Trend CBC D/C heparin SCDs  INFECTIOUS A: Febrile resolved, no evidence of acute nidus of infection. P:   D/c empiric Levaquin as less likely true COPD Monitor for other s/s of infection PCT <0.11  ENDOCRINE  A:  DM2. Hyperlipidemia. P:   SSI  NEUROLOGIC A:  Acute encephalopathy - improving, likely related to acute on chronic resp failure. P:   Goal RASS 0 to -1 Propofol gtt  Please see also pending addendum for revision / clarification / completion by attending Dr. Sung Amabile.  Bobbye Morton, MD PGY-2, [facility] 05/22/2013, 7:42 AM  I have interviewed and examined the patient and reviewed the database. I have formulated the assessment and plan as reflected in the note above with amendments made by me. 30 mins of direct critical care time provided  This is a repeated problem - acute decompensation of chronic hypercarbic resp failure. I have discussed with pt and his wife. I have recommended that he undergo trach tube placement. Have requested ENT consult and discussed with Dr [name]. Planned trach tube placement tomorrow. Will then need to decide whether he needs long term nocturnal ventilation  Billy Fischer, MD;  PCCM service; Mobile [phone]

## 2013-05-22 NOTE — Consult Note (Signed)
Reason for Consult:respiratory failure Referring Physician: PCCM  [name] [name] is an 62 y.o. male.  HPI: 62 year old male with long history of worsening chronic respiratory failure thought to be primarily restrictive in nature.  He has had to be hospitalized frequently in the past year due to respiratory distress and has required intubation at times.  He was admitted and intubated for similar problems two days ago.  In order to provide a more stable airway and perhaps prevent future hospitalizations, his PCCM doctor wishes to proceed with tracheostomy.  Past Medical History  Diagnosis Date  . Coronary atherosclerosis of unspecified type of vessel, native or graft   . Chronic pulmonary heart disease, unspecified   . Chronic respiratory failure   . Unspecified sleep apnea   . Chronic airway obstruction, not elsewhere classified   . Angina   . Shortness of breath   . Anemia   . Chronic kidney disease   . Anxiety   . Hypertension   . CHF (congestive heart failure)   . GERD (gastroesophageal reflux disease)   . History of gout   . Chronic diastolic CHF (congestive heart failure) 10/28/2007    Hospitalized with diastolic CHF controlled with diuresis and BiPAP 2D April 2012 EF 55-60 with severe LVH and grade 1 diastolic dysfunction    . 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 PVD (peripheral vascular disease) 07/13/2011    S/P Lt SFA PTA Sept 2009, known occluded Rt SFA   . Obstructive sleep apnea 10/28/2007    BiPAP 13/10 with O2 3 L [facility].      . Swelling of limb     Venous Duplex, 09/03/2012 - no evidence of thrombus or thrombophelitis  . S/P CABG (coronary artery bypass graft)     Nuc, 09/17/2012 - Significant ST abnormalities that are suggestive of ischemia, low risk nuclear study  . TIA (transient ischemic attack)     2D Echo, 12/05/2010 - EF 55-60%, severe LVH  . PVD (peripheral vascular disease)   . COPD (chronic obstructive pulmonary disease)    . OSA (obstructive sleep apnea)   . Chronic renal insufficiency, stage III (moderate)   . Hyperlipidemia     Past Surgical History  Procedure Laterality Date  . Coronary artery bypass graft  07/12/2000    x6,  . Appendectomy    . Bilateral vats ablation    . Coronary angioplasty with stent placement    . Hemmorhoids    . Lung sx  2005    Growth on outside of right lung  . Esophagogastroduodenoscopy  08/11/2011    Procedure: ESOPHAGOGASTRODUODENOSCOPY (EGD);  Surgeon: Barrie Folk, MD;  Location: [facility] ENDOSCOPY;  Service: Endoscopy;  Laterality: N/A;  . Colonoscopy  08/11/2011    Procedure: COLONOSCOPY;  Surgeon: Barrie Folk, MD;  Location: [facility] ENDOSCOPY;  Service: Endoscopy;  Laterality: N/A;  . Cataract extraction    . Ptca  04/16/2008    Left SFA-60% stenosis stented with a 7x6 Nitinolself-expanding stent resulting in reduction of 60% mid segmental L SFA stenosis to 0% residual  . Cardiac catheterization  02/16/2010    RCA-80% stenosis stented with a 3x12 Taxus ION DES resulting in reduction of a total occlusion to 0%  . Cardiac catheterization  03/29/2009    Ostial/Proximal PDA-SVG-75% stenosis stented with a 3.0x23 Xience V DES reduction resulting in 75% stenosis to 0%  . Cardiac catheterization  08/05/2004    Circumflex OM stented  with a 2.5x13 Cypher stent resulting in reduction of 60% to 0%  . Cardiac catheterization  06/28/2000    Moderate left main and severe three-vessel disease, consider CABG  . Abdominal aortogram  12/03/2002    70% stenosis at the origin of the profunda femoris, 75% mid superficial femoral artery stenosis    Family History  Problem Relation Age of Onset  . Diabetes Sister     Social History:  reports that he quit smoking about 14 years ago. His smoking use included Cigarettes. He has a 25 pack-year smoking history. He has never used smokeless tobacco. He reports that he drinks alcohol. He reports that he does not use illicit drugs.  Allergies:   Allergies  Allergen Reactions  . Amlodipine Besy-Benazepril Hcl Swelling    Lips swell  . Percocet [Oxycodone-Acetaminophen] Other (See Comments)    hallucinations    Medications: I have reviewed the patient's current medications.  Results for orders placed during the hospital encounter of 05/20/13 (from the past 48 hour(s))  CBC WITH DIFFERENTIAL     Status: Abnormal   Collection Time    05/20/13 10:25 PM      Result Value Range   WBC 5.5  4.0 - 10.5 K/uL   RBC 3.54 (*) 4.22 - 5.81 MIL/uL   Hemoglobin 9.8 (*) 13.0 - 17.0 g/dL   HCT 16.1 (*) 09.6 - 04.5 %   MCV 103.7 (*) 78.0 - 100.0 fL   MCH 27.7  26.0 - 34.0 pg   MCHC 26.7 (*) 30.0 - 36.0 g/dL   RDW 40.9  81.1 - 91.4 %   Platelets 102 (*) 150 - 400 K/uL   Comment: PLATELET COUNT CONFIRMED BY SMEAR   Neutrophils Relative % 75  43 - 77 %   Neutro Abs 4.1  1.7 - 7.7 K/uL   Lymphocytes Relative 13  12 - 46 %   Lymphs Abs 0.7  0.7 - 4.0 K/uL   Monocytes Relative 11  3 - 12 %   Monocytes Absolute 0.6  0.1 - 1.0 K/uL   Eosinophils Relative 1  0 - 5 %   Eosinophils Absolute 0.0  0.0 - 0.7 K/uL   Basophils Relative 0  0 - 1 %   Basophils Absolute 0.0  0.0 - 0.1 K/uL  COMPREHENSIVE METABOLIC PANEL     Status: Abnormal   Collection Time    05/20/13 10:25 PM      Result Value Range   Sodium 148 (*) 135 - 145 mEq/L   Potassium 5.1  3.5 - 5.1 mEq/L   Chloride 97  96 - 112 mEq/L   CO2 >45 (*) 19 - 32 mEq/L   Comment: CRITICAL RESULT CALLED TO, READ BACK BY AND VERIFIED WITH:     MUNCEY J,RN 05/20/13 2355 WAYK   Glucose, Bld 160 (*) 70 - 99 mg/dL   BUN 28 (*) 6 - 23 mg/dL   Creatinine, Ser 7.82 (*) 0.50 - 1.35 mg/dL   Calcium 9.4  8.4 - 95.6 mg/dL   Total Protein 7.7  6.0 - 8.3 g/dL   Albumin 3.7  3.5 - 5.2 g/dL   AST 18  0 - 37 U/L   ALT 12  0 - 53 U/L   Alkaline Phosphatase 80  39 - 117 U/L   Total Bilirubin 0.4  0.3 - 1.2 mg/dL   GFR calc non Af Amer 45 (*) >90 mL/min   GFR calc Af Amer 52 (*) >90 mL/min   Comment:  (NOTE)  The eGFR has been calculated using the CKD EPI equation.     This calculation has not been validated in all clinical situations.     eGFR's persistently <90 mL/min signify possible Chronic Kidney     Disease.  PRO B NATRIURETIC PEPTIDE     Status: Abnormal   Collection Time    05/20/13 10:25 PM      Result Value Range   Pro B Natriuretic peptide (BNP) 821.6 (*) 0 - 125 pg/mL  TROPONIN I     Status: None   Collection Time    05/20/13 10:25 PM      Result Value Range   Troponin I <0.30  <0.30 ng/mL   Comment:            Due to the release kinetics of cTnI,     a negative result within the first hours     of the onset of symptoms does not rule out     myocardial infarction with certainty.     If myocardial infarction is still suspected,     repeat the test at appropriate intervals.  MAGNESIUM     Status: None   Collection Time    05/20/13 10:25 PM      Result Value Range   Magnesium 2.3  1.5 - 2.5 mg/dL  PHOSPHORUS     Status: Abnormal   Collection Time    05/20/13 10:25 PM      Result Value Range   Phosphorus 5.9 (*) 2.3 - 4.6 mg/dL  URINALYSIS, ROUTINE W REFLEX MICROSCOPIC     Status: Abnormal   Collection Time    05/20/13 10:34 PM      Result Value Range   Color, Urine YELLOW  YELLOW   APPearance CLOUDY (*) CLEAR   Specific Gravity, Urine 1.016  1.005 - 1.030   pH 5.0  5.0 - 8.0   Glucose, UA 100 (*) NEGATIVE mg/dL   Hgb urine dipstick MODERATE (*) NEGATIVE   Bilirubin Urine SMALL (*) NEGATIVE   Ketones, ur NEGATIVE  NEGATIVE mg/dL   Protein, ur >621 (*) NEGATIVE mg/dL   Urobilinogen, UA 1.0  0.0 - 1.0 mg/dL   Nitrite NEGATIVE  NEGATIVE   Leukocytes, UA MODERATE (*) NEGATIVE  URINE MICROSCOPIC-ADD ON     Status: Abnormal   Collection Time    05/20/13 10:34 PM      Result Value Range   Squamous Epithelial / LPF MANY (*) RARE   WBC, UA 11-20  <3 WBC/hpf   RBC / HPF 3-6  <3 RBC/hpf   Bacteria, UA RARE  RARE   Urine-Other MANY YEAST     Comment:  AMORPHOUS URATES/PHOSPHATES  URINE CULTURE     Status: None   Collection Time    05/20/13 10:46 PM      Result Value Range   Specimen Description URINE, CATHETERIZED     Special Requests NONE     Culture  Setup Time       Value: 05/21/2013 07:02     Performed at [facility]   Colony Count       Value: 25,000 COLONIES/ML     Performed at [facility]   Culture       Value: YEAST     Performed at [facility]   Report Status 05/22/2013 FINAL    PROTIME-INR     Status: None   Collection Time    05/20/13 11:05 PM      Result Value Range  Prothrombin Time 11.6  11.6 - 15.2 seconds   INR 0.86  0.00 - 1.49  LACTIC ACID, PLASMA     Status: Abnormal   Collection Time    05/20/13 11:51 PM      Result Value Range   Lactic Acid, Venous 0.3 (*) 0.5 - 2.2 mmol/L  POCT I-STAT 3, BLOOD GAS (G3+)     Status: Abnormal   Collection Time    05/21/13 12:06 AM      Result Value Range   pH, Arterial 7.516 (*) 7.350 - 7.450   pCO2 arterial 61.8 (*) 35.0 - 45.0 mmHg   pO2, Arterial 39.0 (*) 80.0 - 100.0 mmHg   Bicarbonate 51.0 (*) 20.0 - 24.0 mEq/L   TCO2 >50  0 - 100 mmol/L   O2 Saturation 82.0     Acid-Base Excess 24.0 (*) 0.0 - 2.0 mmol/L   Patient temperature 34.6 C     Collection site RADIAL, ALLEN'S TEST ACCEPTABLE     Drawn by Operator     Sample type ARTERIAL     Comment NOTIFIED PHYSICIAN    GLUCOSE, CAPILLARY     Status: Abnormal   Collection Time    05/21/13  1:02 AM      Result Value Range   Glucose-Capillary 126 (*) 70 - 99 mg/dL   Comment 1 Notify RN    MRSA PCR SCREENING     Status: None   Collection Time    05/21/13  3:27 AM      Result Value Range   MRSA by PCR NEGATIVE  NEGATIVE   Comment:            The GeneXpert MRSA Assay (FDA     approved for NASAL specimens     only), is one component of a     comprehensive MRSA colonization     surveillance program. It is not     intended to diagnose MRSA     infection nor to guide or     monitor  treatment for     MRSA infections.  BLOOD GAS, ARTERIAL     Status: Abnormal   Collection Time    05/21/13  3:30 AM      Result Value Range   FIO2 0.45     Delivery systems VENTILATOR     Mode PRESSURE REGULATED VOLUME CONTROL     VT 450     Rate 24     Peep/cpap 5.0     pH, Arterial 7.589 (*) 7.350 - 7.450   pCO2 arterial 52.4 (*) 35.0 - 45.0 mmHg   pO2, Arterial 48.0 (*) 80.0 - 100.0 mmHg   Bicarbonate 50.3 (*) 20.0 - 24.0 mEq/L   TCO2 52.0  0 - 100 mmol/L   Acid-Base Excess 25.1 (*) 0.0 - 2.0 mmol/L   O2 Saturation 93.0     Patient temperature 98.6     Collection site RIGHT RADIAL     Drawn by [phone]     Sample type ARTERIAL DRAW     Allens test (pass/fail) PASS  PASS  GLUCOSE, CAPILLARY     Status: Abnormal   Collection Time    05/21/13  3:49 AM      Result Value Range   Glucose-Capillary 123 (*) 70 - 99 mg/dL   Comment 1 Notify RN    TROPONIN I     Status: None   Collection Time    05/21/13  6:00 AM      Result Value Range  Troponin I <0.30  <0.30 ng/mL   Comment:            Due to the release kinetics of cTnI,     a negative result within the first hours     of the onset of symptoms does not rule out     myocardial infarction with certainty.     If myocardial infarction is still suspected,     repeat the test at appropriate intervals.  LACTIC ACID, PLASMA     Status: None   Collection Time    05/21/13  6:00 AM      Result Value Range   Lactic Acid, Venous 1.2  0.5 - 2.2 mmol/L  GLUCOSE, CAPILLARY     Status: None   Collection Time    05/21/13  7:55 AM      Result Value Range   Glucose-Capillary 89  70 - 99 mg/dL  GLUCOSE, CAPILLARY     Status: Abnormal   Collection Time    05/21/13 11:36 AM      Result Value Range   Glucose-Capillary 116 (*) 70 - 99 mg/dL  TROPONIN I     Status: None   Collection Time    05/21/13 12:25 PM      Result Value Range   Troponin I <0.30  <0.30 ng/mL   Comment:            Due to the release kinetics of cTnI,     a  negative result within the first hours     of the onset of symptoms does not rule out     myocardial infarction with certainty.     If myocardial infarction is still suspected,     repeat the test at appropriate intervals.  LACTIC ACID, PLASMA     Status: None   Collection Time    05/21/13 12:25 PM      Result Value Range   Lactic Acid, Venous 1.1  0.5 - 2.2 mmol/L  GLUCOSE, CAPILLARY     Status: Abnormal   Collection Time    05/21/13  3:31 PM      Result Value Range   Glucose-Capillary 159 (*) 70 - 99 mg/dL  GLUCOSE, CAPILLARY     Status: Abnormal   Collection Time    05/21/13  7:15 PM      Result Value Range   Glucose-Capillary 116 (*) 70 - 99 mg/dL  GLUCOSE, CAPILLARY     Status: Abnormal   Collection Time    05/21/13  7:52 PM      Result Value Range   Glucose-Capillary 113 (*) 70 - 99 mg/dL  URINALYSIS, ROUTINE W REFLEX MICROSCOPIC     Status: Abnormal   Collection Time    05/21/13 10:38 PM      Result Value Range   Color, Urine YELLOW  YELLOW   APPearance CLEAR  CLEAR   Specific Gravity, Urine 1.014  1.005 - 1.030   pH 8.0  5.0 - 8.0   Glucose, UA NEGATIVE  NEGATIVE mg/dL   Hgb urine dipstick MODERATE (*) NEGATIVE   Bilirubin Urine NEGATIVE  NEGATIVE   Ketones, ur NEGATIVE  NEGATIVE mg/dL   Protein, ur 30 (*) NEGATIVE mg/dL   Urobilinogen, UA 1.0  0.0 - 1.0 mg/dL   Nitrite NEGATIVE  NEGATIVE   Leukocytes, UA SMALL (*) NEGATIVE  URINE MICROSCOPIC-ADD ON     Status: None   Collection Time    05/21/13 10:38 PM      Result  Value Range   Squamous Epithelial / LPF RARE  RARE   WBC, UA 3-6  <3 WBC/hpf   RBC / HPF 11-20  <3 RBC/hpf   Bacteria, UA RARE  RARE   Urine-Other RARE YEAST    GLUCOSE, CAPILLARY     Status: Abnormal   Collection Time    05/21/13 11:47 PM      Result Value Range   Glucose-Capillary 132 (*) 70 - 99 mg/dL  GLUCOSE, CAPILLARY     Status: Abnormal   Collection Time    05/22/13  4:01 AM      Result Value Range   Glucose-Capillary 103 (*) 70 -  99 mg/dL  BLOOD GAS, ARTERIAL     Status: Abnormal   Collection Time    05/22/13  5:30 AM      Result Value Range   FIO2 0.40     Delivery systems VENTILATOR     Mode PRESSURE REGULATED VOLUME CONTROL     VT 450     Rate 14     Peep/cpap 5.0     pH, Arterial 7.374  7.350 - 7.450   pCO2 arterial 82.7 (*) 35.0 - 45.0 mmHg   Comment: CRITICAL RESULT CALLED TO, READ BACK BY AND VERIFIED WITH:      Jamesetta So, RN AT [phone], BY CHUCK SPENCER RRT, ON 05/22/2013   pO2, Arterial 93.7  80.0 - 100.0 mmHg   Bicarbonate 47.2 (*) 20.0 - 24.0 mEq/L   TCO2 49.7  0 - 100 mmol/L   Acid-Base Excess 20.7 (*) 0.0 - 2.0 mmol/L   O2 Saturation 97.7     Patient temperature 98.6     Collection site RIGHT RADIAL     Drawn by [phone]     Sample type ARTERIAL DRAW     Allens test (pass/fail) PASS  PASS  GLUCOSE, CAPILLARY     Status: Abnormal   Collection Time    05/22/13  8:04 AM      Result Value Range   Glucose-Capillary 138 (*) 70 - 99 mg/dL  BASIC METABOLIC PANEL     Status: Abnormal   Collection Time    05/22/13  8:50 AM      Result Value Range   Sodium 146 (*) 135 - 145 mEq/L   Potassium 4.7  3.5 - 5.1 mEq/L   Chloride 97  96 - 112 mEq/L   CO2 >45 (*) 19 - 32 mEq/L   Comment: CRITICAL RESULT CALLED TO, READ BACK BY AND VERIFIED WITH:     HEMPHILL C RN 05/22/13 0942 COSTELLO B   Glucose, Bld 141 (*) 70 - 99 mg/dL   BUN 57 (*) 6 - 23 mg/dL   Comment: DELTA CHECK NOTED   Creatinine, Ser 2.07 (*) 0.50 - 1.35 mg/dL   Calcium 9.3  8.4 - 84.1 mg/dL   GFR calc non Af Amer 33 (*) >90 mL/min   GFR calc Af Amer 38 (*) >90 mL/min   Comment: (NOTE)     The eGFR has been calculated using the CKD EPI equation.     This calculation has not been validated in all clinical situations.     eGFR's persistently <90 mL/min signify possible Chronic Kidney     Disease.  CBC     Status: Abnormal   Collection Time    05/22/13  8:50 AM      Result Value Range   WBC 6.6  4.0 - 10.5 K/uL   RBC 3.20 (*) 4.22 -  5.81 MIL/uL   Hemoglobin 8.7 (*) 13.0 - 17.0 g/dL   HCT 16.1 (*) 09.6 - 04.5 %   MCV 98.8  78.0 - 100.0 fL   MCH 27.2  26.0 - 34.0 pg   MCHC 27.5 (*) 30.0 - 36.0 g/dL   RDW 40.9  81.1 - 91.4 %   Platelets 95 (*) 150 - 400 K/uL   Comment: CONSISTENT WITH PREVIOUS RESULT  TRIGLYCERIDES     Status: None   Collection Time    05/22/13  8:50 AM      Result Value Range   Triglycerides 105  <150 mg/dL  GLUCOSE, CAPILLARY     Status: Abnormal   Collection Time    05/22/13 11:22 AM      Result Value Range   Glucose-Capillary 126 (*) 70 - 99 mg/dL    Dg Chest Port 1 View  05/22/2013   CLINICAL DATA:  Respiratory failure.  EXAM: PORTABLE CHEST - 1 VIEW  COMPARISON:  05/20/2013  FINDINGS: Endotracheal tube tip 5.8 cm above the carinal  Nasogastric tube courses below the diaphragm. Tip is not included on the present exam.  Post CABG. Cardiomegaly.  Pulmonary edema suspected. Limited for excluding basilar infiltrate. No gross pneumothorax.  Calcified aorta  IMPRESSION: Progressive airspace disease suggestive of pulmonary edema.  Cardiomegaly.  Please see above.   Electronically Signed   By: Bridgett Larsson M.D.   On: 05/22/2013 07:47   Dg Chest Port 1 View  05/20/2013   CLINICAL DATA:  Endotracheal tube placement.  EXAM: PORTABLE CHEST - 1 VIEW  COMPARISON:  None.  FINDINGS: The patient's endotracheal tube is seen ending 2-3 cm above the carina. The enteric tube is noted extending below the diaphragm.  The lungs are relatively well expanded. Mild vascular congestion is noted. A small left pleural effusion is seen, with mild left basilar atelectasis. No pneumothorax is identified.  The cardiomediastinal silhouette is enlarged. The patient is status post median sternotomy, with evidence of prior CABG. No acute osseous abnormalities are identified.  IMPRESSION: 1. Endotracheal tube seen ending 2-3 cm above the carina. 2. Small left pleural effusion, with left basilar atelectasis. 3. Cardiomegaly and mild  vascular congestion.   Electronically Signed   By: Roanna Raider M.D.   On: 05/20/2013 23:08    Review of Systems  Unable to perform ROS: intubated   Blood pressure 141/57, pulse 77, temperature 98.6 F (37 C), temperature source Oral, resp. rate 19, height 6' 0.84" (1.85 m), weight 133 kg (293 lb 3.4 oz), SpO2 100.00%. Physical Exam  Constitutional: He appears well-developed and well-nourished. No distress.  HENT:  Head: Normocephalic and atraumatic.  Right Ear: External ear normal.  Left Ear: External ear normal.  Nose: Nose normal.  Mouth/Throat: Oropharynx is clear and moist.  Orotracheally intubated.  Eyes: Conjunctivae and EOM are normal. Pupils are equal, round, and reactive to light.  Neck: Normal range of motion. Neck supple.  Somewhat obese neck.  Landmarks palpable.  Large raised nevus along left inferior beard area past midline.  Cardiovascular: Normal rate.   Respiratory: Effort normal.  On ventilator.  GI:  Did not examine.  Genitourinary:  Did not examine.  Musculoskeletal: Normal range of motion.  Neurological: He is alert. No cranial nerve deficit.  Skin: Skin is warm and dry.  Psychiatric:  Could not assess.    Assessment/Plan: Chronic respiratory failure I agree with Dr. Bard Herbert that a tracheostomy would be the best long-term management of his pulmonary disease and will  likely prevent hospitalizations and allow use of a ventilator at night, if needed.  The patient is in agreement.  Surgery is planned tomorrow.  I will discuss plans and details with his wife as well.  [name] [name] 05/22/2013, 3:18 PM

## 2013-05-23 ENCOUNTER — Encounter (HOSPITAL_COMMUNITY)
Admission: EM | Disposition: A | Discharge status: Home / Self Care | Payer: Self-pay | Source: Home / Self Care | Attending: Pulmonary Disease

## 2013-05-23 ENCOUNTER — Encounter (HOSPITAL_COMMUNITY): Payer: Self-pay | Admitting: Certified Registered Nurse Anesthetist

## 2013-05-23 ENCOUNTER — Inpatient Hospital Stay (HOSPITAL_COMMUNITY): Payer: Medicare Other | Admitting: Certified Registered Nurse Anesthetist

## 2013-05-23 ENCOUNTER — Encounter (HOSPITAL_COMMUNITY): Payer: Medicare Other | Admitting: Certified Registered Nurse Anesthetist

## 2013-05-23 DIAGNOSIS — R0602 Shortness of breath: Secondary | ICD-10-CM

## 2013-05-23 HISTORY — PX: TRACHEOSTOMY TUBE PLACEMENT: SHX814

## 2013-05-23 LAB — PROTIME-INR
INR: 1 (ref 0.00–1.49)
Prothrombin Time: 13 seconds (ref 11.6–15.2)

## 2013-05-23 LAB — CBC
HCT: 31.4 % — ABNORMAL LOW (ref 39.0–52.0)
Hemoglobin: 8.8 g/dL — ABNORMAL LOW (ref 13.0–17.0)
MCH: 27 pg (ref 26.0–34.0)
MCHC: 28 g/dL — ABNORMAL LOW (ref 30.0–36.0)
MCV: 96.3 fL (ref 78.0–100.0)
RBC: 3.26 MIL/uL — ABNORMAL LOW (ref 4.22–5.81)
RDW: 15.4 % (ref 11.5–15.5)
WBC: 6.3 10*3/uL (ref 4.0–10.5)

## 2013-05-23 LAB — BASIC METABOLIC PANEL
BUN: 63 mg/dL — ABNORMAL HIGH (ref 6–23)
CO2: 44 mEq/L (ref 19–32)
Chloride: 94 mEq/L — ABNORMAL LOW (ref 96–112)
Creatinine, Ser: 2.02 mg/dL — ABNORMAL HIGH (ref 0.50–1.35)
GFR calc Af Amer: 39 mL/min — ABNORMAL LOW (ref >90)

## 2013-05-23 LAB — GLUCOSE, CAPILLARY
Glucose-Capillary: 132 mg/dL — ABNORMAL HIGH (ref 70–99)
Glucose-Capillary: 118 mg/dL — ABNORMAL HIGH (ref 70–99)
Glucose-Capillary: 112 mg/dL — ABNORMAL HIGH (ref 70–99)
Glucose-Capillary: 137 mg/dL — ABNORMAL HIGH (ref 70–99)

## 2013-05-23 SURGERY — CREATION, TRACHEOSTOMY
Anesthesia: General | Site: Neck | Wound class: Clean Contaminated

## 2013-05-23 MED ORDER — FENTANYL CITRATE 0.05 MG/ML IJ SOLN
50.0000 ug | INTRAMUSCULAR | Status: DC | PRN
  Administered 2013-05-23: 100 ug via INTRAVENOUS
  Administered 2013-05-23: 50 ug via INTRAVENOUS
  Filled 2013-05-23 (×2): qty 2

## 2013-05-23 MED ORDER — MIDAZOLAM HCL 5 MG/5ML IJ SOLN
INTRAMUSCULAR | Status: DC | PRN
  Administered 2013-05-23: 2 mg via INTRAVENOUS

## 2013-05-23 MED ORDER — PROPOFOL 10 MG/ML IV BOLUS
INTRAVENOUS | Status: DC | PRN
  Administered 2013-05-23: 50 mg via INTRAVENOUS

## 2013-05-23 MED ORDER — LIDOCAINE-EPINEPHRINE 1 %-1:100000 IJ SOLN
INTRAMUSCULAR | Status: DC | PRN
  Administered 2013-05-23: 3.5 mL via INTRADERMAL

## 2013-05-23 MED ORDER — METHYLPREDNISOLONE ACETATE 80 MG/ML IJ SUSP
80.0000 mg | Freq: Once | INTRAMUSCULAR | Status: DC
  Filled 2013-05-23: qty 1

## 2013-05-23 MED ORDER — VITAL AF 1.2 CAL PO LIQD
1000.0000 mL | ORAL | Status: DC
  Administered 2013-05-23: 1000 mL
  Filled 2013-05-23 (×2): qty 1000

## 2013-05-23 MED ORDER — SODIUM CHLORIDE 0.9 % IV SOLN
INTRAVENOUS | Status: DC | PRN
  Administered 2013-05-23: 12:00:00 via INTRAVENOUS

## 2013-05-23 MED ORDER — ALBUTEROL SULFATE (5 MG/ML) 0.5% IN NEBU
2.5000 mg | INHALATION_SOLUTION | RESPIRATORY_TRACT | Status: DC | PRN
  Filled 2013-05-23 (×3): qty 0.5

## 2013-05-23 MED ORDER — ROCURONIUM BROMIDE 100 MG/10ML IV SOLN
INTRAVENOUS | Status: DC | PRN
  Administered 2013-05-23: 50 mg via INTRAVENOUS
  Administered 2013-05-23: 30 mg via INTRAVENOUS

## 2013-05-23 MED ORDER — PRO-STAT SUGAR FREE PO LIQD
60.0000 mL | Freq: Every day | ORAL | Status: DC
  Administered 2013-05-23 – 2013-05-24 (×2): 60 mL
  Filled 2013-05-23 (×8): qty 60

## 2013-05-23 MED ORDER — 0.9 % SODIUM CHLORIDE (POUR BTL) OPTIME
TOPICAL | Status: DC | PRN
  Administered 2013-05-23: 1000 mL

## 2013-05-23 MED ORDER — METHYLPREDNISOLONE SODIUM SUCC 40 MG IJ SOLR
40.0000 mg | Freq: Once | INTRAMUSCULAR | Status: AC
  Administered 2013-05-23: 40 mg via INTRAVENOUS
  Filled 2013-05-23: qty 1

## 2013-05-23 MED ORDER — ASPIRIN 81 MG PO CHEW
81.0000 mg | CHEWABLE_TABLET | Freq: Every day | ORAL | Status: DC
  Administered 2013-05-23 – 2013-06-12 (×21): 81 mg
  Filled 2013-05-23 (×20): qty 1

## 2013-05-23 MED ORDER — ALBUTEROL SULFATE (5 MG/ML) 0.5% IN NEBU
2.5000 mg | INHALATION_SOLUTION | Freq: Four times a day (QID) | RESPIRATORY_TRACT | Status: DC
  Administered 2013-05-23 – 2013-06-07 (×58): 2.5 mg via RESPIRATORY_TRACT
  Filled 2013-05-23 (×58): qty 0.5

## 2013-05-23 MED ORDER — ATORVASTATIN CALCIUM 40 MG PO TABS
40.0000 mg | ORAL_TABLET | Freq: Every day | ORAL | Status: DC
  Administered 2013-05-23 – 2013-06-11 (×20): 40 mg
  Filled 2013-05-23 (×21): qty 1

## 2013-05-23 MED ORDER — LIDOCAINE-EPINEPHRINE 1 %-1:100000 IJ SOLN
INTRAMUSCULAR | Status: AC
  Filled 2013-05-23: qty 1

## 2013-05-23 MED ORDER — PHENYLEPHRINE HCL 10 MG/ML IJ SOLN
INTRAMUSCULAR | Status: DC | PRN
  Administered 2013-05-23 (×5): 40 ug via INTRAVENOUS

## 2013-05-23 MED ORDER — CLOPIDOGREL BISULFATE 75 MG PO TABS
75.0000 mg | ORAL_TABLET | Freq: Every day | ORAL | Status: DC
  Administered 2013-05-24 – 2013-06-12 (×20): 75 mg
  Filled 2013-05-23 (×21): qty 1

## 2013-05-23 MED ORDER — FENTANYL CITRATE 0.05 MG/ML IJ SOLN
INTRAMUSCULAR | Status: DC | PRN
  Administered 2013-05-23 (×2): 100 ug via INTRAVENOUS
  Administered 2013-05-23: 50 ug via INTRAVENOUS

## 2013-05-23 MED ORDER — IPRATROPIUM BROMIDE 0.02 % IN SOLN
0.5000 mg | Freq: Four times a day (QID) | RESPIRATORY_TRACT | Status: DC
  Administered 2013-05-23 – 2013-06-07 (×58): 0.5 mg via RESPIRATORY_TRACT
  Filled 2013-05-23 (×56): qty 2.5

## 2013-05-23 MED ORDER — FAMOTIDINE 40 MG/5ML PO SUSR
20.0000 mg | Freq: Two times a day (BID) | ORAL | Status: DC
  Administered 2013-05-23 – 2013-06-12 (×40): 20 mg
  Filled 2013-05-23 (×42): qty 2.5

## 2013-05-23 MED ORDER — MIDAZOLAM HCL 2 MG/2ML IJ SOLN: 2.0000 mg | INTRAMUSCULAR | Status: DC | PRN

## 2013-05-23 SURGICAL SUPPLY — 38 items
BLADE SURG 15 STRL LF DISP TIS (BLADE) ×1 IMPLANT
BLADE SURG 15 STRL SS (BLADE) ×1
BLADE SURG ROTATE 9660 (MISCELLANEOUS) IMPLANT
CANISTER SUCTION 2500CC (MISCELLANEOUS) ×2 IMPLANT
CLEANER TIP ELECTROSURG 2X2 (MISCELLANEOUS) ×2 IMPLANT
CLOTH BEACON ORANGE TIMEOUT ST (SAFETY) ×2 IMPLANT
COVER SURGICAL LIGHT HANDLE (MISCELLANEOUS) ×2 IMPLANT
CRADLE DONUT ADULT HEAD (MISCELLANEOUS) IMPLANT
ELECT COATED BLADE 2.86 ST (ELECTRODE) ×2 IMPLANT
ELECT REM PT RETURN 9FT ADLT (ELECTROSURGICAL) ×2
ELECTRODE REM PT RTRN 9FT ADLT (ELECTROSURGICAL) ×1 IMPLANT
GAUZE SPONGE 4X4 16PLY XRAY LF (GAUZE/BANDAGES/DRESSINGS) ×2 IMPLANT
GLOVE BIO SURGEON STRL SZ7.5 (GLOVE) ×2 IMPLANT
GOWN STRL NON-REIN LRG LVL3 (GOWN DISPOSABLE) ×4 IMPLANT
HOLDER TRACH TUBE VELCRO 19.5 (MISCELLANEOUS) ×2 IMPLANT
KIT BASIN OR (CUSTOM PROCEDURE TRAY) ×2 IMPLANT
KIT ROOM TURNOVER OR (KITS) ×2 IMPLANT
KIT SUCTION CATH 14FR (SUCTIONS) IMPLANT
NS IRRIG 1000ML POUR BTL (IV SOLUTION) ×2 IMPLANT
PACK EENT II TURBAN DRAPE (CUSTOM PROCEDURE TRAY) ×2 IMPLANT
PAD ARMBOARD 7.5X6 YLW CONV (MISCELLANEOUS) ×4 IMPLANT
PENCIL BUTTON HOLSTER BLD 10FT (ELECTRODE) ×2 IMPLANT
SPONGE DRAIN TRACH 4X4 STRL 2S (GAUZE/BANDAGES/DRESSINGS) ×2 IMPLANT
SPONGE GAUZE 4X4 12PLY (GAUZE/BANDAGES/DRESSINGS) ×2 IMPLANT
SPONGE INTESTINAL PEANUT (DISPOSABLE) IMPLANT
SUT MON AB 3-0 SH 27 (SUTURE) ×1
SUT MON AB 3-0 SH27 (SUTURE) ×1 IMPLANT
SUT SILK 0 FSL (SUTURE) ×2 IMPLANT
SUT SILK 2 0 REEL (SUTURE) ×2 IMPLANT
SUT SILK 2 0 SH CR/8 (SUTURE) ×2 IMPLANT
SUT VIC AB 2-0 FS1 27 (SUTURE) IMPLANT
SYR 20ML ECCENTRIC (SYRINGE) ×2 IMPLANT
SYR BULB 3OZ (MISCELLANEOUS) ×2 IMPLANT
SYR CONTROL 10ML LL (SYRINGE) IMPLANT
TOWEL OR 17X24 6PK STRL BLUE (TOWEL DISPOSABLE) ×2 IMPLANT
TOWEL OR 17X26 10 PK STRL BLUE (TOWEL DISPOSABLE) ×2 IMPLANT
TUBE CONNECTING 12X1/4 (SUCTIONS) ×2 IMPLANT
WATER STERILE IRR 1000ML POUR (IV SOLUTION) ×2 IMPLANT

## 2013-05-23 NOTE — Preoperative (Signed)
Beta Blockers   Reason not to administer Beta Blockers:Not Applicable 

## 2013-05-23 NOTE — Care Management Note (Signed)
    Page 1 of 2   05/29/2013     3:13:59 PM   CARE MANAGEMENT NOTE 05/29/2013  Patient:  [name] [name], [name] [name]   Account Number:  [id number]  Date Initiated:  05/21/2013  Documentation initiated by:  [facility]  Subjective/Objective Assessment:   Resp failure - requiring intubation.     DC Planning Services  CM consult      Choice offered to / List presented to:  C-3 Spouse   DME arranged  TRACH SUPPLIES  VENTILATOR      DME agency  [facility]    HH arranged  HH-1 RN  HH-2 PT  HH-3 OT  HH-5 SPEECH THERAPY  HH-7 RESPIRATORY THERAPY      HH agency  [facility]   Per UR Regulation:  Reviewed for med. necessity/level of care/duration of stay  If discussed at Long Length of Stay Meetings, dates discussed:   05/27/2013  05/29/2013   Comments:  Contact:  Ermine, Spofford   [phone]                Thompson,Stephanie Daughter     [phone]                Alexis, Mizuno Sister [phone]  05/29/13 1120 Henslee Lottman RN MSN BSN CCM Pt will need nocturnal vent for indefinite period of time. Wife now states she wants pt to return home and is willing to learn vent mgmt, trach care, suctioning, etc.  Pt currently has rolling walker and home O2 through [facility] and she states she would prefer equipment and home health services through [facility].  Referral made. She will talk with family members and determine 24/7 backup.  05/26/13 1039 Derotha Fishbaugh RN MSN BSN CCM Met with spouse who now states she does not want to take him home, prefers he go to SNF.  CSW notified.  05-23-13 11am Avie Arenas, RNBSN [phone] Elective trach today.   Wife and sister at bedside.  Wife is home with him 24/7 and her goal is to get him home with whatever equipment he has, including if he needs the vent on discharge.

## 2013-05-23 NOTE — Progress Notes (Signed)
Pt rec'd back from OR w/ trach in place.  Placed pt back on rest mode on vent for now.  No distress noted.

## 2013-05-23 NOTE — Transfer of Care (Signed)
Immediate Anesthesia Transfer of Care Note  Patient: [name] [name]  Procedure(s) Performed: Procedure(s): TRACHEOSTOMY (N/A)  Patient Location: ICU  Anesthesia Type:General  Level of Consciousness: sedated  Airway & Oxygen Therapy: Patient placed on Ventilator (see vital sign flow sheet for setting) and tracheostomy in place  Post-op Assessment: Post -op Vital signs reviewed and stable and report to ICU RN Harvin Hazel  Post vital signs: Reviewed and stable  Complications: No apparent anesthesia complications

## 2013-05-23 NOTE — Anesthesia Procedure Notes (Signed)
Date/Time: 05/23/2013 11:43 AM Performed by: Margaree Mackintosh Pre-anesthesia Checklist: Patient identified, Timeout performed, Emergency Drugs available, Suction available and Patient being monitored Patient Re-evaluated:Patient Re-evaluated prior to inductionOxygen Delivery Method: Circle system utilized Preoxygenation: Pre-oxygenation with 100% oxygen Intubation Type: Inhalational induction with existing ETT Placement Confirmation: positive ETCO2 and breath sounds checked- equal and bilateral Dental Injury: Teeth and Oropharynx as per pre-operative assessment

## 2013-05-23 NOTE — Progress Notes (Addendum)
PULMONARY  / CRITICAL CARE MEDICINE  Name: [name] [name] MRN: [id number] DOB: Feb 03, 1951    ADMISSION DATE:  05/20/2013 CONSULTATION DATE:  05/20/2013  REFERRING MD :  EDP PRIMARY SERVICE:  PCCM  CHIEF COMPLAINT:  Dyspnea  BRIEF PATIENT DESCRIPTION: 62 yo with past medical history of chronic severe restrictive pulmonary secondary to obesity and pleural fibrosis (previously reported as COPD), OSA with poor BiPAP compliance at home, OHS and recent intubation for acute respiratory failure brought to ED with progressive dyspnea and acute encephalopathy.  In ED intubated for airway protection.  PCCM was consulted.  SIGNIFICANT EVENTS / STUDIES:  10/7, early AM 10/8 - intubated in the ED, admitted to ICU 10/8 - stable on vent, 2D echo >>> LA and LV mildly dilated, mild LVH, EF 55-60%, grade 1 diastolic dysfunction, septum with abnormal function/dyssynergy, mild AS, calcified mitral annulus 10/9 - urinary retention overnight, Foley replaced; ENT consult for tracheostomy eval (Dr. [name]) 10/10 - to OR for trache  LINES / TUBES: ETT 10/7 >> 10/10 Trach 10/10 >>    CULTURES: 10/7 Urine >>> 25k CFU/mL yeast  ANTIBIOTICS: Levaquin 10/7 >>> 10/7  SUBJECTIVE: Intubated/synchronous, sedated, but able to answer questions. Some pain in left middle finger PIP joint.  VITAL SIGNS: Temp:  [98.5 F (36.9 C)-99 F (37.2 C)] 98.5 F (36.9 C) (10/10 0804) Pulse Rate:  [67-86] 86 (10/10 1000) Resp:  [17-30] 30 (10/10 1000) BP: (113-156)/(46-78) 128/66 mmHg (10/10 1000) SpO2:  [100 %] 100 % (10/10 1000) FiO2 (%):  [30 %] 30 % (10/10 1000) Weight:  [275 lb 2.2 oz (124.8 kg)] 275 lb 2.2 oz (124.8 kg) (10/10 0451)  HEMODYNAMICS:   VENTILATOR SETTINGS: Vent Mode:  [-] PSV FiO2 (%):  [30 %] 30 % Set Rate:  [14 bmp] 14 bmp Vt Set:  [450 mL] 450 mL PEEP:  [5 cmH20] 5 cmH20 Pressure Support:  [18 cmH20] 18 cmH20 Plateau Pressure:  [25 cmH20-29 cmH20] 29 cmH20  INTAKE / OUTPUT: Intake/Output      10/09 0701 - 10/10 0700 10/10 0701 - 10/11 0700   I.V. (mL/kg) 2175.9 (17.4) 33.9 (0.3)   Other     NG/GT 300    Total Intake(mL/kg) 2475.9 (19.8) 33.9 (0.3)   Urine (mL/kg/hr) 5105 (1.7) 275 (0.6)   Total Output 5105 275   Net -2629.1 -241.1         PHYSICAL EXAMINATION: General:  Appears acutely ill, mechanically ventilated, synchronous Neuro:  Sedated but awakes to stimuli, responds to questions/simple commands, RASS -1 HEENT:  PERRL, OETT / OGT in place Cardiovascular:  RRR, no m/r/g Lungs:  Bilateral diminished breath sounds esp at bases Abdomen:  Soft, nontender, bowel sounds present Musculoskeletal:  Moves all extremities; left third digit PIP joint tender, slightly swollen Skin:  Intact  LABS:  Recent Labs Lab 05/20/13 2225  05/20/13 2305 05/20/13 2351 05/21/13 0006 05/21/13 0330 05/21/13 0600 05/21/13 1225 05/22/13 0530 05/22/13 0850 05/23/13 0400 05/23/13 0835  HGB 9.8*  --   --   --   --   --   --   --   --  8.7* 8.8*  --   WBC 5.5  --   --   --   --   --   --   --   --  6.6 6.3  --   PLT 102*  --   --   --   --   --   --   --   --  95* 95*  --  NA 148*  --   --   --   --   --   --   --   --  146* 143  --   K 5.1  --   --   --   --   --   --   --   --  4.7 3.9  --   CL 97  --   --   --   --   --   --   --   --  97 94*  --   CO2 >45*  --   --   --   --   --   --   --   --  >45* 44*  --   GLUCOSE 160*  --   --   --   --   --   --   --   --  141* 118*  --   BUN 28*  --   --   --   --   --   --   --   --  57* 63*  --   CREATININE 1.61*  --   --   --   --   --   --   --   --  2.07* 2.02*  --   CALCIUM 9.4  --   --   --   --   --   --   --   --  9.3 9.2  --   MG 2.3  --   --   --   --   --   --   --   --   --   --   --   PHOS 5.9*  --   --   --   --   --   --   --   --   --   --   --   AST 18  --   --   --   --   --   --   --   --   --   --   --   ALT 12  --   --   --   --   --   --   --   --   --   --   --   ALKPHOS 80  --   --   --   --   --   --   --    --   --   --   --   BILITOT 0.4  --   --   --   --   --   --   --   --   --   --   --   PROT 7.7  --   --   --   --   --   --   --   --   --   --   --   ALBUMIN 3.7  --   --   --   --   --   --   --   --   --   --   --   INR  --   --  0.86  --   --   --   --   --   --   --   --  1.00  LATICACIDVEN  --   < >  --  0.3*  --   --  1.2 1.1  --   --   --   --  TROPONINI <0.30  --   --   --   --   --  <0.30 <0.30  --   --   --   --   PROBNP 821.6*  --   --   --   --   --   --   --   --   --   --   --   PHART  --   --   --   --  7.516* 7.589*  --   --  7.374  --   --   --   PCO2ART  --   --   --   --  61.8* 52.4*  --   --  82.7*  --   --   --   PO2ART  --   --   --   --  39.0* 48.0*  --   --  93.7  --   --   --   < > = values in this interval not displayed.  Recent Labs Lab 05/22/13 1608 05/22/13 1935 05/23/13 0030 05/23/13 0417 05/23/13 0803  GLUCAP 133* 113* 126* 110* 132*    CXR:  10/9 - left effusion/atelectasis little change  ASSESSMENT / PLAN:  PULMONARY A:  Acute on chronic respiratory failure - intubated Chronic restrictive pulmonary disease - hx of chronic pleural thickening, obesity.  Unlikely AECOPD  given known restrictive pathology OSA/OHS with poor BiPAP compliance at home. P:   To surgery for trache today Goal SpO2>92, pH>7.30 Continue full mechanical support, expect to come off vent after trache today Trend ABG / CXR Continue Albuterol / Atrovent PRN Hold home Spiriva, Pulmicort  CARDIOVASCULAR A: CAD s/p CABG.   Chronic diastolic CHF without frank exacerbation  Transiently hypotensive after intubation - BP now improved Troponins negative P:  Goal MAP>60 Continue ASA, Plavix, Lipitor Hold Metoprolol, Imdur - consider restart as BP tolerates TTE as above - known grade 1 diastolic dysfunction, ?new septal abnormality S/p Lasix 10/9, net negative fluid balance  RENAL A:  Acute on chronic renal failure - Cr rise 1.6 > 2, stable s/p Lasix 10/9  Urinary  retention - good output with Foley Hypernatremia - improved Hypovolemia - improved P:   Goal CVP>10 Trend BMP Continue free water per tube Monitor fluid balance - aim net negative  GASTROINTESTINAL A:  No active issues. P:   Continue tube feeds plus free water 200 mL q8h Famotidine for SUP  HEMATOLOGIC A:  Anemia, chronic likely related to multiple chronic illness. Hb stable 8-9. Thrombocytopenia - uncertain cause, chronic and present on admission, no obvious bleed P:  Trend CBC SCDs Cont home meds  INFECTIOUS A: Fever resolved, no evidence of acute infection. P:   Monitor for s/s of infection   ENDOCRINE  A:  DM2, diet controlled P:   CBGs q 8 hrs SSI if glu > 180  NEURO/RHEUM A:  Acute encephalopathy, resolved Pain due to ETT, resolved Hx of gout - swollen, painful left 3rd digit of left hand P:   D/C all sedation post trach PRN analgesia Mobilize PT ordered 10/10 Depomedrol given 10/09 for gout   Bobbye Morton, MD PGY-2, [facility] 05/23/2013, 10:47 AM   I have interviewed and examined the patient and reviewed the database. I have formulated the assessment and plan as reflected in the note above with amendments made by me. Transfer to SDU/vent bed  Billy Fischer, MD;  PCCM service; Mobile [phone]

## 2013-05-23 NOTE — Progress Notes (Signed)
Pt failed SBT due to high RR, however the pt is currently weaning on 5/18 and is tolerating well at this time. The pt is scheduled for trach placement at 11am. No complications noted. RT will monitor.

## 2013-05-23 NOTE — Anesthesia Postprocedure Evaluation (Signed)
  Anesthesia Post-op Note  Patient: [name] [name]  Procedure(s) Performed: Procedure(s): TRACHEOSTOMY (N/A)  Patient Location: ICU  Anesthesia Type:General  Level of Consciousness: sedated  Airway and Oxygen Therapy: Patient remains intubated per anesthesia plan and Patient placed on Ventilator (see vital sign flow sheet for setting)  Post-op Pain: none  Post-op Assessment: Post-op Vital signs reviewed, Patient's Cardiovascular Status Stable, Respiratory Function Stable and No signs of Nausea or vomiting  Post-op Vital Signs: Reviewed and stable  Complications: No apparent anesthesia complications

## 2013-05-23 NOTE — Anesthesia Preprocedure Evaluation (Signed)
Anesthesia Evaluation  Patient identified by MRN, date of birth, ID band  Airway       Dental   Pulmonary shortness of breath, sleep apnea , COPD Intubated ventilated         Cardiovascular hypertension, + angina + CAD, + Peripheral Vascular Disease and +CHF     Neuro/Psych    GI/Hepatic GERD-  ,  Endo/Other  diabetes  Renal/GU Renal disease     Musculoskeletal   Abdominal   Peds  Hematology   Anesthesia Other Findings   Reproductive/Obstetrics                           Anesthesia Physical Anesthesia Plan  ASA: IV  Anesthesia Plan: General   Post-op Pain Management:    Induction: Inhalational and Intravenous  Airway Management Planned: Oral ETT  Additional Equipment:   Intra-op Plan:   Post-operative Plan: Post-operative intubation/ventilation  Informed Consent:   Plan Discussed with:   Anesthesia Plan Comments:         Anesthesia Quick Evaluation

## 2013-05-23 NOTE — Brief Op Note (Signed)
05/20/2013 - 05/23/2013  12:41 PM  PATIENT:  [name] [name]  62 y.o. male  PRE-OPERATIVE DIAGNOSIS:  RESPIRATORY FAILURE  POST-OPERATIVE DIAGNOSIS:  RESPIRATORY FAILURE  PROCEDURE:  Procedure(s): TRACHEOSTOMY (N/A)  SURGEON:  Surgeon(s) and Role:    * Christia Reading, MD - Primary  PHYSICIAN ASSISTANT:   ASSISTANTS: none   ANESTHESIA:   general  EBL:  Total I/O In: 33.9 [I.V.:33.9] Out: 275 [Urine:275]  BLOOD ADMINISTERED:none  DRAINS: none   LOCAL MEDICATIONS USED:  LIDOCAINE   SPECIMEN:  No Specimen  DISPOSITION OF SPECIMEN:  N/A  COUNTS:  YES  TOURNIQUET:  * No tourniquets in log *  DICTATION: .Other Dictation: Dictation Number [id number]  PLAN OF CARE: Return to ICU  PATIENT DISPOSITION:  ICU - intubated and hemodynamically stable.   Delay start of Pharmacological VTE agent (>24hrs) due to surgical blood loss or risk of bleeding: yes

## 2013-05-23 NOTE — Progress Notes (Signed)
CRITICAL VALUE ALERT  Critical value received:  CO2  Date of notification:  05/23/2013  Time of notification:  0540  Critical value read back: yes  Nurse who received alert: Jaliel Deavers rn  MD notified (1st page): Dr Deterding  Time of first page: 0542  MD notified (2nd page):  Time of second page:  Responding MD: Dr Darrick Penna.  Time MD responded:0545

## 2013-05-24 LAB — GLUCOSE, CAPILLARY
Glucose-Capillary: 139 mg/dL — ABNORMAL HIGH (ref 70–99)
Glucose-Capillary: 146 mg/dL — ABNORMAL HIGH (ref 70–99)
Glucose-Capillary: 156 mg/dL — ABNORMAL HIGH (ref 70–99)
Glucose-Capillary: 192 mg/dL — ABNORMAL HIGH (ref 70–99)
Glucose-Capillary: 130 mg/dL — ABNORMAL HIGH (ref 70–99)

## 2013-05-24 MED ORDER — TRAMADOL 5 MG/ML ORAL SUSPENSION: 25.0000 mg | Freq: Four times a day (QID) | ORAL | Status: DC

## 2013-05-24 MED ORDER — PRO-STAT SUGAR FREE PO LIQD
60.0000 mL | Freq: Three times a day (TID) | ORAL | Status: DC
  Administered 2013-05-24 – 2013-05-27 (×10): 60 mL
  Filled 2013-05-24 (×12): qty 60

## 2013-05-24 MED ORDER — TRAMADOL HCL 50 MG PO TABS
25.0000 mg | ORAL_TABLET | Freq: Four times a day (QID) | ORAL | Status: DC
  Administered 2013-05-24 – 2013-06-07 (×53): 25 mg
  Filled 2013-05-24 (×53): qty 1

## 2013-05-24 MED ORDER — VITAL HIGH PROTEIN PO LIQD
1000.0000 mL | ORAL | Status: DC
  Administered 2013-05-25 – 2013-05-26 (×2): 1000 mL
  Filled 2013-05-24 (×6): qty 1000

## 2013-05-24 MED ORDER — METHYLPREDNISOLONE SODIUM SUCC 40 MG IJ SOLR
40.0000 mg | Freq: Once | INTRAMUSCULAR | Status: AC
  Administered 2013-05-24: 40 mg via INTRAVENOUS
  Filled 2013-05-24: qty 1

## 2013-05-24 MED ORDER — WHITE PETROLATUM GEL
Status: AC
  Administered 2013-05-24: 0.2
  Filled 2013-05-24: qty 5

## 2013-05-24 NOTE — Progress Notes (Signed)
PULMONARY  / CRITICAL CARE MEDICINE  Name: [name] [name] MRN: [id number] DOB: 11-19-1950    ADMISSION DATE:  05/20/2013 CONSULTATION DATE:  05/20/2013  REFERRING MD :  EDP PRIMARY SERVICE:  PCCM  CHIEF COMPLAINT:  Dyspnea  BRIEF PATIENT DESCRIPTION: 62 yo with past medical history of chronic severe restrictive pulmonary secondary to obesity and pleural fibrosis (previously reported as COPD), OSA with poor BiPAP compliance at home, OHS and recent intubation for acute respiratory failure brought to ED with progressive dyspnea and acute encephalopathy.  In ED intubated for airway protection.  PCCM was consulted.  SIGNIFICANT EVENTS / STUDIES:  10/7, early AM 10/8 - intubated in the ED, admitted to ICU 10/8 - stable on vent, 2D echo >>> LA and LV mildly dilated, mild LVH, EF 55-60%, grade 1 diastolic dysfunction, septum with abnormal function/dyssynergy, mild AS, calcified mitral annulus 10/9 - urinary retention overnight, Foley replaced; ENT consult for tracheostomy eval (Dr. [name]) 10/10 - to OR for trache  LINES / TUBES: ETT 10/7 >> 10/10 Trach 10/10 >>    CULTURES: 10/7 Urine >>> 25k CFU/mL yeast  ANTIBIOTICS: Levaquin 10/7 >>> 10/7  SUBJECTIVE:   S/p trach 10/10 , doing well -sats adequate on trach collar , min pain Left finger sore/tender , hx of Gout   VITAL SIGNS: Temp:  [98.3 F (36.8 C)-100.6 F (38.1 C)] 99.6 F (37.6 C) (10/11 0740) Pulse Rate:  [80-89] 81 (10/11 0500) Resp:  [15-34] 22 (10/11 0500) BP: (112-156)/(55-79) 125/64 mmHg (10/11 0855) SpO2:  [94 %-100 %] 98 % (10/11 0500) FiO2 (%):  [30 %-40 %] 35 % (10/11 0855) Weight:  [129.2 kg (284 lb 13.4 oz)] 129.2 kg (284 lb 13.4 oz) (10/11 0500)  HEMODYNAMICS:   VENTILATOR SETTINGS: Vent Mode:  [-] PRVC FiO2 (%):  [30 %-40 %] 35 % Set Rate:  [14 bmp] 14 bmp Vt Set:  [450 mL] 450 mL PEEP:  [5 cmH20] 5 cmH20 Plateau Pressure:  [21 cmH20-30 cmH20] 21 cmH20  INTAKE / OUTPUT: Intake/Output     10/10  0701 - 10/11 0700 10/11 0701 - 10/12 0700   I.V. (mL/kg) 643.9 (5)    NG/GT 400    Total Intake(mL/kg) 1043.9 (8.1)    Urine (mL/kg/hr) 2900 (0.9)    Blood 25 (0)    Total Output 2925     Net -1881.1           PHYSICAL EXAMINATION: General:  Alert, smiling up in bed  Neuro:  A/O x 3 , follows commands  HEENT:  Trach , dsg c/d ,  Cardiovascular:  RRR, no m/r/g Lungs:  Bilateral diminished breath sounds esp at bases Abdomen:  Soft, nontender, bowel sounds present Musculoskeletal:  Moves all extremities; left third digit PIP joint tender, slightly swollen Skin:  Intact  LABS: PULMONARY  Recent Labs Lab 05/21/13 0006 05/21/13 0330 05/22/13 0530  PHART 7.516* 7.589* 7.374  PCO2ART 61.8* 52.4* 82.7*  PO2ART 39.0* 48.0* 93.7  HCO3 51.0* 50.3* 47.2*  TCO2 >50 52.0 49.7  O2SAT 82.0 93.0 97.7    CBC  Recent Labs Lab 05/20/13 2225 05/22/13 0850 05/23/13 0400  HGB 9.8* 8.7* 8.8*  HCT 36.7* 31.6* 31.4*  WBC 5.5 6.6 6.3  PLT 102* 95* 95*    COAGULATION  Recent Labs Lab 05/20/13 2305 05/23/13 0835  INR 0.86 1.00    CARDIAC   Recent Labs Lab 05/20/13 2225 05/21/13 0600 05/21/13 1225  TROPONINI <0.30 <0.30 <0.30    Recent Labs Lab 05/20/13 2225  PROBNP 821.6*     CHEMISTRY  Recent Labs Lab 05/20/13 2225 05/22/13 0850 05/23/13 0400  NA 148* 146* 143  K 5.1 4.7 3.9  CL 97 97 94*  CO2 >45* >45* 44*  GLUCOSE 160* 141* 118*  BUN 28* 57* 63*  CREATININE 1.61* 2.07* 2.02*  CALCIUM 9.4 9.3 9.2  MG 2.3  --   --   PHOS 5.9*  --   --    Estimated Creatinine Clearance: 54 ml/min (by C-G formula based on Cr of 2.02).   LIVER  Recent Labs Lab 05/20/13 2225 05/20/13 2305 05/23/13 0835  AST 18  --   --   ALT 12  --   --   ALKPHOS 80  --   --   BILITOT 0.4  --   --   PROT 7.7  --   --   ALBUMIN 3.7  --   --   INR  --  0.86 1.00     INFECTIOUS  Recent Labs Lab 05/20/13 2351 05/21/13 0600 05/21/13 1225  LATICACIDVEN 0.3* 1.2 1.1      ENDOCRINE CBG (last 3)   Recent Labs  05/24/13 0017 05/24/13 0418 05/24/13 0723  GLUCAP 163* 139* 146*    IMAGING x48h  No results found.    Recent Labs Lab 05/23/13 1620 05/23/13 2001 05/24/13 0017 05/24/13 0418 05/24/13 0723  GLUCAP 112* 137* 163* 139* 146*    CXR:  10/9 - left effusion/atelectasis little change  ASSESSMENT / PLAN:  PULMONARY A:  Acute on chronic respiratory failure - s/p Trach 10/10  Chronic restrictive pulmonary disease - hx of chronic pleural thickening, obesity.  Unlikely AECOPD  given known restrictive pathology OSA/OHS with poor BiPAP compliance at home. S/p trach  P:   Trach collar as tolerated, sat goal >90% Check cxr in am  Continue Albuterol / Atrovent Hold home Spiriva, Pulmicort  CARDIOVASCULAR A: CAD s/p CABG.   Chronic diastolic CHF without frank exacerbation  Transiently hypotensive after intubation - BP now improved Troponins negative P:  Goal MAP>60 Continue ASA, Plavix, Lipitor Hold Metoprolol, Imdur - consider restart as BP tolerates TTE as above - known grade 1 diastolic dysfunction, ?new septal abnormality S/p Lasix 10/9, net negative fluid balance-remains in neg balance   RENAL A:  Acute on chronic renal failure - Cr rise 1.6 > 2, stable s/p Lasix 10/9  Urinary retention - good output with Foley Hypernatremia - improved Hypovolemia - improved P:   Goal CVP>10 Trend BMP Continue free water per tube Monitor fluid balance - aim net negative  GASTROINTESTINAL A:  No active issues. P:   Continue tube feeds plus free water 200 mL q8h Famotidine for SUP  HEMATOLOGIC A:  Anemia, chronic likely related to multiple chronic illness. Hb stable 8-9. Thrombocytopenia - uncertain cause, chronic and present on admission, no obvious bleed P:  Trend CBC SCDs Cont home meds  INFECTIOUS A: Fever resolved, no evidence of acute infection. P:   Monitor for s/s of infection   ENDOCRINE  A:  DM2, diet  controlled P:   CBGs q 8 hrs SSI if glu > 180  NEURO/RHEUM A:  Acute encephalopathy, resolved Pain due to ETT, resolved Hx of gout - swollen, painful left 3rd digit of left hand P:   PRN analgesia Mobilize PT ordered 10/10 Repeat solumedrol today  for gout (3 rd dose )   ?Transfer to SDU/vent bed Julio Sicks, NP-C  PCCM  [phone]  05/24/2013, 9:55 AM   STAFF  NOTE: I, Dr Lavinia Sharps have personally reviewed patient's available data, including medical history, events of note, physical examination and test results as part of my evaluation. I have discussed with resident/NP and other care providers such as pharmacist, RN and RRT.  In addition,  I personally evaluated patient and elicited key findings of acute on chronic resp failure on basis of obesity, OSA andpleural fibrosis. S/p trach by ENT. Doing well. Move to sDu. No family at bedside.  Rest per NP/medical resident whose note is outlined above and that I agree with     Dr. Kalman Shan, M.D., [facility].C.P Pulmonary and Critical Care Medicine Staff Physician [facility] [place] [facility] Pager: [phone], If no answer or between  15:00h - 7:00h: call [phone]  05/24/2013 11:53 AM

## 2013-05-24 NOTE — Op Note (Signed)
NAMEMarland [name]  WILKIE, ZENON NO.:  [id number]  MEDICAL RECORD NO.:  [id number]  LOCATION:  2M08C                        FACILITY:  [facility]  PHYSICIAN:  Antony Contras, MD     DATE OF BIRTH:  [birth year]  DATE OF PROCEDURE:  05/23/2013 DATE OF DISCHARGE:                              OPERATIVE REPORT   PREOPERATIVE DIAGNOSIS:  Chronic respiratory failure.  POSTOPERATIVE DIAGNOSIS:  Chronic respiratory failure.  PROCEDURE:  Tracheostomy.  SURGEON:  Antony Contras, MD.  ANESTHESIA:  General endotracheal anesthesia.  COMPLICATIONS:  None.  INDICATION:  The patient is a 62 year old African American male with a long history of restrictive lung disease that has progress and required multiple hospitalizations in the last year including intubations.  He was most recently admitted on May 20, 2013, intubated.  His pulmonologist has recommended tracheostomy for better long-term pulmonary management and he presents to the operative room for surgical management.  FINDINGS:  The anatomy of the anterior neck in the region of dissection was normal.  DESCRIPTION OF PROCEDURE:  The patient was identified in the intensive care unit, and informed consent having been obtained from the patient's wife including discussion of risks, benefits, alternatives, the patient was brought to the operative suite, and put on the operative table in supine position.  Anesthesia was induced and the patient was maintained via endotracheal anesthesia.  A shoulder roll was placed and the anterior neck was prepped and draped in sterile fashion.  Incision was marked with a marking pen in a vertical fashion in the lower neck and injected with 1% lidocaine with 1:100,000 epinephrine.  An incision was made with a 15 blade scalpel through the skin and an area of subcutaneous fat was then removed using Bovie electrocautery. Dissection continued down through the midline raphae of the strap muscles, and this  went down to identify the cricoid cartilage.  Some of the overlying tissue was divided and ligated.  The cricoid was then elevated with a cricoid hook and the anterior tracheal wall was then cleared off using Bovie electrocautery with partial division of the thyroid isthmus.  An incision was then made between rings 2 and 3 of the anterior tracheal wall using 15 blade scalpel.  This was extended through the tracheal wall using scissors and extended in either direction laterally to extend the incision in the tracheal wall. Inferiorly directed cuts were then made through the third ring at the lateral extent of the incision on either side to allow the ring to be folded out as a Bjork flap.  A 3-0 Monocryl suture was then passed through the skin in the inferior extent of the incision and then through the tracheal flap in 2 positions and these were secured with hemostats momentarily.  With the sutures in place, the endotracheal tube was backed out to above the tracheostomy site and a #8 cuffed Shiley trach tube was placed into the site and cuff inflated and inner cannula placed.  The tracheostomy was hooked up to the anesthesia circuit and the patient was successfully ventilated.  The Monocryl sutures were then tied down and then the trach flange was secured to the anterior neck skin using 0 silk suture in 4  quadrants.  The patient was then cleaned off and a trach sponge was added followed by a Velcro trach tie.  At this point, the patient was returned to anesthesia for wake-up, was moved to the intensive care unit in stable condition.     Antony Contras, MD     DDB/MEDQ  D:  05/23/2013  T:  05/24/2013  Job:  [id number]

## 2013-05-24 NOTE — Evaluation (Signed)
Physical Therapy Evaluation Patient Details Name: [name] [name] MRN: [id number] DOB: Mar 14, 1951 Today's Date: 05/24/2013 Time: 4098-1191 PT Time Calculation (min): 14 min  PT Assessment / Plan / Recommendation History of Present Illness  62 yo with past medical history of chronic severe restrictive pulmonary secondary to obesity and pleural fibrosis (previously reported as COPD), OSA with poor BiPAP compliance at home, OHS and recent intubation for acute respiratory failure brought to ED with progressive dyspnea and acute encephalopathy.  In ED intubated for airway protection. Trach placed 10/10 and pt now on trach collar.  Clinical Impression  Pt admitted with above. Pt currently with functional limitations due to the deficits listed below (see PT Problem List).  Pt will benefit from skilled PT to increase their independence and safety with mobility to allow discharge to the venue listed below.       PT Assessment  Patient needs continued PT services    Follow Up Recommendations  Home health PT;Supervision/Assistance - 24 hour    Does the patient have the potential to tolerate intense rehabilitation      Barriers to Discharge        Equipment Recommendations  None recommended by PT    Recommendations for Other Services     Frequency Min 3X/week    Precautions / Restrictions Precautions Precautions: Fall   Pertinent Vitals/Pain VSS      Mobility  Bed Mobility Bed Mobility: Supine to Sit;Sitting - Scoot to Edge of Bed Supine to Sit: 4: Min assist;HOB elevated Sitting - Scoot to [place] of Bed: 4: Min assist Details for Bed Mobility Assistance: Assist to bring trunk up. Transfers Transfers: Sit to Stand;Stand to [employer] Transfers Sit to Stand: 4: Min assist;With upper extremity assist;From bed Stand to Sit: 4: Min assist;With upper extremity assist;With armrests;To chair/3-in-1 Stand Pivot Transfers: 4: Min assist (+1 for lines) Details for Transfer  Assistance: Assist to bring hips up.    Exercises     PT Diagnosis: Difficulty walking;Generalized weakness  PT Problem List: Decreased strength;Decreased activity tolerance;Decreased balance;Decreased mobility PT Treatment Interventions: DME instruction;Gait training;Patient/family education;Stair training;Functional mobility training;Therapeutic activities;Therapeutic exercise;Balance training     PT Goals(Current goals can be found in the care plan section) Acute Rehab PT Goals Patient Stated Goal: Return home PT Goal Formulation: With patient Time For Goal Achievement: 05/31/13 Potential to Achieve Goals: Good  Visit Information  Last PT Received On: 05/24/13 Assistance Needed: +2 (for lines) History of Present Illness: 62 yo with past medical history of chronic severe restrictive pulmonary secondary to obesity and pleural fibrosis (previously reported as COPD), OSA with poor BiPAP compliance at home, OHS and recent intubation for acute respiratory failure brought to ED with progressive dyspnea and acute encephalopathy.  In ED intubated for airway protection. Trach placed 10/10 and pt now on trach collar.       Prior Functioning  Home Living Family/patient expects to be discharged to:: Private residence Living Arrangements: Spouse/significant other;Children Available Help at Discharge: Family;Available PRN/intermittently Type of Home: Apartment Home Access: Level entry Home Layout: One level Home Equipment: Walker - 2 wheels;Cane - single point;Other (comment) (O2.) Prior Function Level of Independence: Independent with assistive device(s) Comments: Amb with rolling walker recently. Still drives.  Uses O2 at home. Communication Communication: Tracheostomy Dominant Hand: Right    Cognition  Cognition Arousal/Alertness: Awake/alert Behavior During Therapy: WFL for tasks assessed/performed Overall Cognitive Status: Within Functional Limits for tasks assessed     Extremity/Trunk Assessment Upper Extremity Assessment Upper Extremity Assessment: Overall  WFL for tasks assessed Lower Extremity Assessment Lower Extremity Assessment: Generalized weakness   Balance Balance Balance Assessed: Yes Static Sitting Balance Static Sitting - Balance Support: No upper extremity supported Static Sitting - Level of Assistance: 6: Modified independent (Device/Increase time) Static Standing Balance Static Standing - Balance Support: Bilateral upper extremity supported Static Standing - Level of Assistance: 5: Stand by assistance  End of Session PT - End of Session Activity Tolerance: Patient tolerated treatment well Patient left: in chair;with call bell/phone within reach;with family/visitor present Nurse Communication: Mobility status  GP     Calix Heinbaugh 05/24/2013, 4:09 PM  [employer] PT [phone]

## 2013-05-24 NOTE — Progress Notes (Addendum)
NUTRITION CONSULT/FOLLOW UP  Intervention:    Initiate Vital HP formula at 25 ml/hr and in 4 hours advance to goal rate of 35 ml/hr with Prostat liquid protein 60 ml 3 times daily via tube to provide 1440 kcals (64% of estimated kcal needs), 163 gm protein (100% of estimated protein needs), 702 ml of free water RD to follow for nutrition care plan  Nutrition Dx:   Inadequate oral intake related to inability to eat as evidenced by NPO status, ongoing  Goal:   Enteral nutrition to provide 60-70% of estimated calorie needs (22-25 kcals/kg ideal body weight) and 100% of estimated protein needs, based on ASPEN guidelines for permissive underfeeding in critically ill obese individuals, currently unmet  Monitor:   EN regimen & tolerance, respiratory status, weight, labs, I/O's  Assessment:   Patient with history of COPD, OSA, and OHS, and recent intubation for acute respiratory failure admitted with dyspnea.   Patient s/p procedure 10/11: TRACHEOSTOMY  Patient is currently on ventilator support MV: 9.1 Temp: 37.6  Vital AF 1.2 formula initiated via Adult Tube Feeding Protocol.  RD consulted for EN management -- will change EN regimen to better meet kcal, protein goals.  Height: Ht Readings from Last 1 Encounters:  05/20/13 6' 0.83" (1.85 m)    Weight Status:   Wt Readings from Last 1 Encounters:  05/24/13 284 lb 13.4 oz (129.2 kg)    Body mass index is 37.75 kg/(m^2).  Re-estimated needs:  Kcal: 2245 Protein: 160-170 gm Fluid: per MD  Skin: neck incision   Diet Order: NPO   Intake/Output Summary (Last 24 hours) at 05/24/13 0823 Last data filed at 05/24/13 0600  Gross per 24 hour  Intake   1030 ml  Output   2800 ml  Net  -1770 ml    Labs:   Recent Labs Lab 05/20/13 2225 05/22/13 0850 05/23/13 0400  NA 148* 146* 143  K 5.1 4.7 3.9  CL 97 97 94*  CO2 >45* >45* 44*  BUN 28* 57* 63*  CREATININE 1.61* 2.07* 2.02*  CALCIUM 9.4 9.3 9.2  MG 2.3  --   --    PHOS 5.9*  --   --   GLUCOSE 160* 141* 118*    CBG (last 3)   Recent Labs  05/24/13 0017 05/24/13 0418 05/24/13 0723  GLUCAP 163* 139* 146*    Scheduled Meds: . albuterol  2.5 mg Nebulization Q6H  . antiseptic oral rinse  1 application Mouth Rinse QID  . aspirin  81 mg Per Tube Daily  . atorvastatin  40 mg Per Tube q1800  . chlorhexidine  15 mL Mouth/Throat BID  . clopidogrel  75 mg Per Tube Daily  . famotidine  20 mg Per Tube BID  . feeding supplement (PRO-STAT SUGAR FREE 64)  60 mL Per Tube 5 X Daily  . feeding supplement (VITAL AF 1.2 CAL)  1,000 mL Per Tube Q24H  . free water  200 mL Per Tube Q8H  . ipratropium  0.5 mg Nebulization Q6H    Continuous Infusions: . sodium chloride 10 mL/hr (05/21/13 0956)  . dextrose 10 mL/hr at 05/23/13 0751    Maureen Chatters, RD, LDN Pager #: [phone] After-Hours Pager #: [phone]

## 2013-05-25 ENCOUNTER — Inpatient Hospital Stay (HOSPITAL_COMMUNITY): Payer: Medicare Other

## 2013-05-25 DIAGNOSIS — E662 Morbid (severe) obesity with alveolar hypoventilation: Secondary | ICD-10-CM

## 2013-05-25 DIAGNOSIS — I5032 Chronic diastolic (congestive) heart failure: Secondary | ICD-10-CM

## 2013-05-25 DIAGNOSIS — I509 Heart failure, unspecified: Secondary | ICD-10-CM

## 2013-05-25 LAB — BASIC METABOLIC PANEL
BUN: 76 mg/dL — ABNORMAL HIGH (ref 6–23)
Calcium: 8.9 mg/dL (ref 8.4–10.5)
GFR calc Af Amer: 43 mL/min — ABNORMAL LOW (ref >90)
GFR calc non Af Amer: 37 mL/min — ABNORMAL LOW (ref >90)
Potassium: 4.3 mEq/L (ref 3.5–5.1)
Sodium: 144 mEq/L (ref 135–145)

## 2013-05-25 LAB — CBC
Hemoglobin: 9.5 g/dL — ABNORMAL LOW (ref 13.0–17.0)
MCH: 27.7 pg (ref 26.0–34.0)
RBC: 3.43 MIL/uL — ABNORMAL LOW (ref 4.22–5.81)
WBC: 6.9 10*3/uL (ref 4.0–10.5)

## 2013-05-25 LAB — GLUCOSE, CAPILLARY
Glucose-Capillary: 157 mg/dL — ABNORMAL HIGH (ref 70–99)
Glucose-Capillary: 127 mg/dL — ABNORMAL HIGH (ref 70–99)
Glucose-Capillary: 126 mg/dL — ABNORMAL HIGH (ref 70–99)
Glucose-Capillary: 124 mg/dL — ABNORMAL HIGH (ref 70–99)

## 2013-05-25 NOTE — Progress Notes (Signed)
PULMONARY  / CRITICAL CARE MEDICINE  Name: [name] [name] MRN: [id number] DOB: 08/20/1950    ADMISSION DATE:  05/20/2013 CONSULTATION DATE:  05/20/2013  REFERRING MD :  EDP PRIMARY SERVICE:  PCCM  CHIEF COMPLAINT:  Dyspnea  BRIEF PATIENT DESCRIPTION: 62 yo with past medical history of chronic severe restrictive pulmonary secondary to obesity and pleural fibrosis (previously reported as COPD), OSA with poor BiPAP compliance at home, OHS and recent intubation for acute respiratory failure brought to ED with progressive dyspnea and acute encephalopathy.  In ED intubated for airway protection.  PCCM was consulted.  SIGNIFICANT EVENTS / STUDIES:  10/7, early AM 10/8 - intubated in the ED, admitted to ICU 10/8 - stable on vent, 2D echo >>> LA and LV mildly dilated, mild LVH, EF 55-60%, grade 1 diastolic dysfunction, septum with abnormal function/dyssynergy, mild AS, calcified mitral annulus 10/9 - urinary retention overnight, Foley replaced; ENT consult for tracheostomy eval (Dr. [name]) 10/10 - to OR for trache  LINES / TUBES: ETT 10/7 >> 10/10 Trach 10/10 >>    CULTURES: 10/7 Urine >>> 25k CFU/mL yeast  ANTIBIOTICS: Levaquin 10/7 >>> 10/7  SUBJECTIVE:   Doing well on TC this am, and is on mandatory vent at HS?  No increased wob noted.  VITAL SIGNS: Temp:  [97.9 F (36.6 C)-99.7 F (37.6 C)] 97.9 F (36.6 C) (10/12 1131) Pulse Rate:  [74-94] 74 (10/12 1131) Resp:  [15-26] 21 (10/12 1131) BP: (106-123)/(55-74) 122/61 mmHg (10/12 1131) SpO2:  [95 %-100 %] 100 % (10/12 1131) FiO2 (%):  [28 %-35 %] 28 % (10/12 1131)  HEMODYNAMICS:   VENTILATOR SETTINGS: Vent Mode:  [-]  FiO2 (%):  [28 %-35 %] 28 %  INTAKE / OUTPUT: Intake/Output     10/11 0701 - 10/12 0700 10/12 0701 - 10/13 0700   I.V. (mL/kg) 40 (0.3)    NG/GT 1475    Total Intake(mL/kg) 1515 (12.2)    Urine (mL/kg/hr) 2825 (1) 1100 (1.6)   Blood     Total Output 2825 1100   Net -1310 -1100         PHYSICAL  EXAMINATION: General:  Alert, no acute distress  Neuro:  A/O x 3 , moves all 4. HEENT:  Trach , nose without purulence, no LN or TMG Cardiovascular:  RRR, no m/r/g Lungs:  Bilateral diminished breath sounds, basilar crackles present  Abdomen:  Soft, nontender, bowel sounds present Musculoskeletal:  Moves all extremities, mild edema   LABS:  CHEMISTRY  Recent Labs Lab 05/20/13 2225 05/22/13 0850 05/23/13 0400 05/25/13 0400  NA 148* 146* 143 144  K 5.1 4.7 3.9 4.3  CL 97 97 94* 99  CO2 >45* >45* 44* 37*  GLUCOSE 160* 141* 118* 127*  BUN 28* 57* 63* 76*  CREATININE 1.61* 2.07* 2.02* 1.88*  CALCIUM 9.4 9.3 9.2 8.9  MG 2.3  --   --   --   PHOS 5.9*  --   --   --    Estimated Creatinine Clearance: 56.7 ml/min (by C-G formula based on Cr of 1.88).  ASSESSMENT / PLAN:  PULMONARY A:  Acute on chronic respiratory failure - s/p Trach 10/10  Chronic restrictive pulmonary disease - hx of chronic pleural thickening, obesity.  Unlikely AECOPD  given known restrictive pathology OSA/OHS with poor BiPAP compliance at home. S/p trach  P:   Trach collar as tolerated, sat goal >90% Check cxr in am  Continue Albuterol / Atrovent   CARDIOVASCULAR A: CAD s/p CABG.  Chronic diastolic CHF without frank exacerbation  Transiently hypotensive after intubation - BP now improved Troponins negative P:  Hold Metoprolol, Imdur - consider restart as BP tolerates TTE as above - known grade 1 diastolic dysfunction, ?new septal abnormality   RENAL A:  Acute on chronic renal failure - Hypovolemia - improved Creat improved today, but bun a little higher.  Continue to monitor P:   Trend BMP Continue free water per tube  HEMATOLOGIC A:  Anemia, chronic likely related to multiple chronic illness. Hb stable 8-9. Thrombocytopenia - uncertain cause, chronic and present on admission, no obvious bleed P:  Trend CBC SCDs   ENDOCRINE  A:  DM2, diet controlled P:   CBGs q 8 hrs SSI if glu >  180  NEURO/RHEUM A:  Acute encephalopathy, resolved Pain due to ETT, resolved Hx of gout - swollen, painful left 3rd digit of left hand P:   PRN analgesia Mobilize PT ordered 10/10

## 2013-05-25 NOTE — Progress Notes (Signed)
eLink Physician-Brief Progress Note Patient Name: [name] [name] DOB: 16-Jun-1951 MRN: [id number]  Date of Service  05/25/2013   HPI/Events of Note    Called by RT > patient still very comfortable on ATC; no nocturnal vent needed 10/11>10/12 overnight  eICU Interventions  Continue ATC as tolerated overnight 10/12, leave vent in room in case needed      MCQUAID, DOUGLAS 05/25/2013, 7:20 PM

## 2013-05-25 NOTE — Progress Notes (Signed)
Patient doing well on ATC.  Spoke with Dr Kendrick Fries to ensure that it was ok to leave patient off of ventilator at night.  MD stated that this was ok.  RT will continue to monitor patients respiratory status and will place back on vent if needed.  As long as patients respiratory status is good, patient will remain on ATC.

## 2013-05-26 DIAGNOSIS — D649 Anemia, unspecified: Secondary | ICD-10-CM

## 2013-05-26 DIAGNOSIS — J9600 Acute respiratory failure, unspecified whether with hypoxia or hypercapnia: Secondary | ICD-10-CM

## 2013-05-26 DIAGNOSIS — J449 Chronic obstructive pulmonary disease, unspecified: Secondary | ICD-10-CM

## 2013-05-26 LAB — BASIC METABOLIC PANEL
BUN: 84 mg/dL — ABNORMAL HIGH (ref 6–23)
CO2: 40 mEq/L (ref 19–32)
Calcium: 9.4 mg/dL (ref 8.4–10.5)
Chloride: 98 mEq/L (ref 96–112)
Creatinine, Ser: 1.87 mg/dL — ABNORMAL HIGH (ref 0.50–1.35)
Potassium: 3.9 mEq/L (ref 3.5–5.1)

## 2013-05-26 LAB — GLUCOSE, CAPILLARY
Glucose-Capillary: 120 mg/dL — ABNORMAL HIGH (ref 70–99)
Glucose-Capillary: 151 mg/dL — ABNORMAL HIGH (ref 70–99)
Glucose-Capillary: 189 mg/dL — ABNORMAL HIGH (ref 70–99)

## 2013-05-26 MED ORDER — PREDNISONE 20 MG PO TABS
30.0000 mg | ORAL_TABLET | Freq: Every day | ORAL | Status: DC
  Administered 2013-05-27 – 2013-05-28 (×2): 30 mg via ORAL
  Filled 2013-05-26 (×3): qty 1

## 2013-05-26 NOTE — Progress Notes (Signed)
Rehab Admissions Coordinator Note:  Patient was screened by Clois Dupes for appropriateness for an Inpatient Acute Rehab Consult.  At this time, we are recommending SNF rehab.  [employer] will not approve at this time.  Clois Dupes 05/26/2013, 3:54 PM  I can be reached at [phone].

## 2013-05-26 NOTE — Evaluation (Signed)
[facility] - Evaluation Patient Details  Name: [name] [name] MRN: [id number] Date of Birth: 27-Jul-1951  Today's Date: 05/26/2013 Time: 1500-1520 SLP Time Calculation (min): 20 min  Past Medical History:  Past Medical History  Diagnosis Date  . Coronary atherosclerosis of unspecified type of vessel, native or graft   . Chronic pulmonary heart disease, unspecified   . Chronic respiratory failure   . Unspecified sleep apnea   . Chronic airway obstruction, not elsewhere classified   . Angina   . Shortness of breath   . Anemia   . Chronic kidney disease   . Anxiety   . Hypertension   . CHF (congestive heart failure)   . GERD (gastroesophageal reflux disease)   . History of gout   . Chronic diastolic CHF (congestive heart failure) 10/28/2007    Hospitalized with diastolic CHF controlled with diuresis and BiPAP 2D April 2012 EF 55-60 with severe LVH and grade 1 diastolic dysfunction    . 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 PVD (peripheral vascular disease) 07/13/2011    S/P Lt SFA PTA Sept 2009, known occluded Rt SFA   . Obstructive sleep apnea 10/28/2007    BiPAP 13/10 with O2 3 L Advanced.      . Swelling of limb     Venous Duplex, 09/03/2012 - no evidence of thrombus or thrombophelitis  . S/P CABG (coronary artery bypass graft)     Nuc, 09/17/2012 - Significant ST abnormalities that are suggestive of ischemia, low risk nuclear study  . TIA (transient ischemic attack)     2D Echo, 12/05/2010 - EF 55-60%, severe LVH  . PVD (peripheral vascular disease)   . COPD (chronic obstructive pulmonary disease)   . OSA (obstructive sleep apnea)   . Chronic renal insufficiency, stage III (moderate)   . Hyperlipidemia    Past Surgical History:  Past Surgical History  Procedure Laterality Date  . Coronary artery bypass graft  07/12/2000    x6,  . Appendectomy    . Bilateral vats ablation    . Coronary angioplasty with stent placement     . Hemmorhoids    . Lung sx  2005    Growth on outside of right lung  . Esophagogastroduodenoscopy  08/11/2011    Procedure: ESOPHAGOGASTRODUODENOSCOPY (EGD);  Surgeon: Barrie Folk, MD;  Location: [facility] ENDOSCOPY;  Service: Endoscopy;  Laterality: N/A;  . Colonoscopy  08/11/2011    Procedure: COLONOSCOPY;  Surgeon: Barrie Folk, MD;  Location: [facility] ENDOSCOPY;  Service: Endoscopy;  Laterality: N/A;  . Cataract extraction    . Ptca  04/16/2008    Left SFA-60% stenosis stented with a 7x6 Nitinolself-expanding stent resulting in reduction of 60% mid segmental L SFA stenosis to 0% residual  . Cardiac catheterization  02/16/2010    RCA-80% stenosis stented with a 3x12 Taxus ION DES resulting in reduction of a total occlusion to 0%  . Cardiac catheterization  03/29/2009    Ostial/Proximal PDA-SVG-75% stenosis stented with a 3.0x23 Xience V DES reduction resulting in 75% stenosis to 0%  . Cardiac catheterization  08/05/2004    Circumflex OM stented with a 2.5x13 Cypher stent resulting in reduction of 60% to 0%  . Cardiac catheterization  06/28/2000    Moderate left main and severe three-vessel disease, consider CABG  . Abdominal aortogram  12/03/2002    70% stenosis at the origin of the profunda femoris, 75% mid superficial femoral artery stenosis  HPI:  62 yo with past medical history of chronic severe restrictive pulmonary secondary to obesity and pleural fibrosis (previously reported as COPD), OSA with poor BiPAP compliance at home, OHS and recent intubation for acute respiratory failure brought to ED with progressive dyspnea and acute encephalopathy.  In ED intubated for airway protection. Trach placed 10/10 and pt now on trach collar.   Assessment / Plan / Recommendation Clinical Impression  PMSV evaluation complete. Patient presents with absent upper airway patency as evidenced by ability to inhale through PMSV without ability to exhale and immediate CO2 trapping. Large burst of air noted with valve  removal after maximum of 5 seconds consistently across trials. Cues for coughing/throat clearing in attempt to move secretions unsuccessful in increasing patency. Question impact of secretions vs edema vs large bore trach at this time. SLP will continue to f/u for PMSV trials. To be worn with SLP only at this time.     SLP Assessment  Patient needs continued Speech Lanaguage Pathology Services    Follow Up Recommendations  Inpatient Rehab    Frequency and Duration min 3x week  2 weeks   Pertinent Vitals/Pain none    SLP Goals Potential to Achieve Goals: Good Progress/Goals/Alternative treatment plan discussed with pt/caregiver and they: Agree (see care plan)   PMSV Trial  PMSV was placed for: 5 seconds Able to redirect subglottic air through upper airway: No Able to Attain Phonation: No Voice Quality: Aphonic Able to Expectorate Secretions: No attempts Level of Secretion Expectoration with PMSV: Not observed Respirations During Trial: 31 SpO2 During Trial: 93 % Pulse During Trial: 120 Behavior: Alert;Controlled;Cooperative   Tracheostomy Tube  Additional Tracheostomy Tube Assessment Fenestrated: No Secretion Description: thick, bloody, moderate per RN Frequency of Tracheal Suctioning: 3-4 times per shift Level of Secretion Expectoration: Tracheal    Vent Dependency  Vent Dependent: No FiO2 (%): 28 %    Cuff Deflation Trial Tolerated Cuff Deflation: Yes Length of Time for Cuff Deflation Trial: 3 minutes Behavior: Alert;Controlled;Cooperative  [place], CCC-SLP [phone]   [name] [name] 05/26/2013, 3:55 PM

## 2013-05-26 NOTE — Progress Notes (Signed)
PULMONARY  / CRITICAL CARE MEDICINE  Name: [name] [name] MRN: [id number] DOB: [birth year]    ADMISSION DATE:  05/20/2013 CONSULTATION DATE:  05/20/2013  REFERRING MD :  EDP PRIMARY SERVICE:  PCCM  CHIEF COMPLAINT:  Dyspnea  BRIEF PATIENT DESCRIPTION: 61 y/o with past medical history of chronic severe restrictive pulmonary secondary to obesity and pleural fibrosis (previously reported as COPD), OSA with poor BiPAP compliance at home, OHS and recent intubation for acute respiratory failure brought to ED with progressive dyspnea and acute encephalopathy.  In ED intubated for airway protection.  PCCM was consulted.  SIGNIFICANT EVENTS / STUDIES:  10/07 - early AM 10/8 - intubated in the ED, admitted to ICU 10/08 - stable on vent, 2D echo >>> LA and LV mildly dilated, mild LVH, EF 55-60%, grade 1 diastolic dysfunction, septum with abnormal function/dyssynergy, mild AS, calcified mitral annulus 10/09 - urinary retention overnight, Foley replaced; ENT consult for tracheostomy eval (Dr. [name]) 10/10 - to OR for trach  LINES / TUBES: ETT 10/7 >> 10/10 Trach 10/10 ([place])>>   CULTURES: 10/7 Urine >>> 25k CFU/mL yeast  ANTIBIOTICS: Levaquin 10/7 >>> 10/7  SUBJECTIVE:  RN reports elevated pCO2 @ 40, concern for gouty flare  VITAL SIGNS: Temp:  [97.8 F (36.6 C)-98.4 F (36.9 C)] 98.4 F (36.9 C) (10/13 0717) Pulse Rate:  [74-97] 94 (10/13 0717) Resp:  [16-26] 22 (10/13 0717) BP: (106-128)/(47-68) 106/58 mmHg (10/13 0717) SpO2:  [89 %-100 %] 100 % (10/13 0821) FiO2 (%):  [28 %] 28 % (10/13 0821)  VENTILATOR SETTINGS: Vent Mode:  [-]  FiO2 (%):  [28 %] 28 %  INTAKE / OUTPUT: Intake/Output     10/12 0701 - 10/13 0700 10/13 0701 - 10/14 0700   I.V. (mL/kg)     NG/GT 1500    Total Intake(mL/kg) 1500 (12.1)    Urine (mL/kg/hr) 2925 (1)    Total Output 2925     Net -1425           PHYSICAL EXAMINATION: General:  Alert, no acute distress  Neuro:  A/O x 3 , moves all  4. HEENT:  Trach , nose without purulence, no LN or TMG Cardiovascular:  RRR, no 2-3/6 murmur heard best at 4ICS nipple line Lungs:  Bilateral diminished breath sounds, soft basilar crackles present  Abdomen:  Soft, nontender, bowel sounds present Musculoskeletal:  Moves all extremities, mild edema   LABS:  CHEMISTRY  Recent Labs Lab 05/20/13 2225 05/22/13 0850 05/23/13 0400 05/25/13 0400 05/26/13 0628  NA 148* 146* 143 144 145  K 5.1 4.7 3.9 4.3 3.9  CL 97 97 94* 99 98  CO2 >45* >45* 44* 37* 40*  GLUCOSE 160* 141* 118* 127* 138*  BUN 28* 57* 63* 76* 84*  CREATININE 1.61* 2.07* 2.02* 1.88* 1.87*  CALCIUM 9.4 9.3 9.2 8.9 9.4  MG 2.3  --   --   --   --   PHOS 5.9*  --   --   --   --    Estimated Creatinine Clearance: 57 ml/min (by C-G formula based on Cr of 1.87).  ASSESSMENT / PLAN:  PULMONARY A:   Acute on chronic respiratory failure - s/p Trach 10/10  Chronic restrictive pulmonary disease - hx of chronic pleural thickening, obesity.  Unlikely AECOPD  given known restrictive pathology OSA/OHS - with poor BiPAP compliance at home. S/p trach  P:   Trach collar as tolerated, sat goal >90% Check cxr in am  Continue Albuterol / Atrovent SLP  to eval for swallowing / PMV Cuff down as goal  CARDIOVASCULAR A:  CAD s/p CABG.   Chronic diastolic CHF - without frank exacerbation  Transiently hypotensive after intubation - BP now improved Troponins negative P:  Hold Metoprolol, Imdur - consider restart as BP tolerates TTE as above - known grade 1 diastolic dysfunction, ?new septal abnormality  RENAL A:   Acute on chronic renal failure  Hypovolemia - improved P:   Trend BMP Continue free water per tube, follow na  HEMATOLOGIC A:   Anemia - chronic likely related to multiple chronic illness. Hb stable 8-9. Thrombocytopenia - uncertain cause, chronic and present on admission, no obvious bleed P:  Trend CBC SCDs  ENDOCRINE  A:   DM2 - diet controlled P:    CBGs q 8 hrs SSI if glu > 180 Initiate prednisone for acute gout, hold other agents due to renal insufficiency  NEURO/RHEUM A:   Acute encephalopathy - resolved Pain due to ETT - resolved Acute Gout Flare - swollen, painful left 3rd digit of left hand, R elbow P:   PRN analgesia Initiate prednisone for acute gout, hold other agents due to renal insufficiency Mobilize PT ordered 10/10  [name], NP-C [facility] Pgr: [phone] or [phone]  I have fully examined this patient and agree with above findings.    And edited infull  Mcarthur Rossetti. Tyson Alias, MD, FACP Pgr: [phone] [facility]

## 2013-05-26 NOTE — Progress Notes (Signed)
Physical Therapy Treatment Patient Details Name: [name] [name] MRN: [id number] DOB: [birth year] Today's Date: 05/26/2013 Time: 4098-1191 PT Time Calculation (min): 38 min  PT Assessment / Plan / Recommendation  History of Present Illness 62 yo with past medical history of chronic severe restrictive pulmonary secondary to obesity and pleural fibrosis (previously reported as COPD), OSA with poor BiPAP compliance at home, OHS and recent intubation for acute respiratory failure brought to ED with progressive dyspnea and acute encephalopathy.  In ED intubated for airway protection. Trach placed 10/10 and pt now on trach collar.   PT Comments   Pt admitted with above. Pt currently with functional limitations due to balance and endurance deficits.  Pt will benefit from skilled PT to increase their independence and safety with mobility to allow discharge to the venue listed below.   Follow Up Recommendations  Supervision/Assistance - 24 hour;CIR                 Equipment Recommendations  Other (comment) (TBA)    Recommendations for Other Services Rehab consult  Frequency Min 3X/week   Progress towards PT Goals Progress towards PT goals: Progressing toward goals  Plan Current plan remains appropriate    Precautions / Restrictions Precautions Precautions: Fall Restrictions Weight Bearing Restrictions: No   Pertinent Vitals/Pain VSS, No pain    Mobility  Bed Mobility Bed Mobility: Not assessed Transfers Transfers: Sit to Stand;Stand to Sit Sit to Stand: 1: +2 Total assist;With upper extremity assist;From chair/3-in-1;With armrests Sit to Stand: Patient Percentage: 50% Stand to Sit: 1: +2 Total assist;With upper extremity assist;To chair/3-in-1;With armrests Stand to Sit: Patient Percentage: 50% Stand Pivot Transfers: Not tested (comment) Details for Transfer Assistance: Pt in low recliner therefore needed assist with pad to acheive sit to stand from low surface even with pillows.   Needed assist to control descent into chair as well.   Ambulation/Gait Ambulation/Gait Assistance: 1: +2 Total assist Ambulation/Gait: Patient Percentage: 70% Ambulation Distance (Feet): 12 Feet (3 feet with sitting rest break and then 9 feet) Assistive device: Rolling walker Ambulation/Gait Assistance Details: Pt ambulated with RW with cues for sequencing steps and RW as well as cues for upright posture.  Had to assist pt with steering RW as well.  Pt with instability noted in bil knees needing support.   Gait Pattern: Step-to pattern;Decreased stride length;Decreased step length - left;Wide base of support;Trunk flexed Gait velocity: decreased Stairs: No Wheelchair Mobility Wheelchair Mobility: No    Exercises General Exercises - Lower Extremity Ankle Circles/Pumps: AROM;Both;10 reps;Seated Long Arc Quad: AROM;Both;10 reps;Seated Hip Flexion/Marching: AROM;Both;10 reps;Seated   PT Goals (current goals can now be found in the care plan section)    Visit Information  Last PT Received On: 05/26/13 Assistance Needed: +2 History of Present Illness: 62 yo with past medical history of chronic severe restrictive pulmonary secondary to obesity and pleural fibrosis (previously reported as COPD), OSA with poor BiPAP compliance at home, OHS and recent intubation for acute respiratory failure brought to ED with progressive dyspnea and acute encephalopathy.  In ED intubated for airway protection. Trach placed 10/10 and pt now on trach collar.    Subjective Data  Subjective: Trach in place.  Pt mouths, "I have gout."   Cognition  Cognition Arousal/Alertness: Awake/alert Behavior During Therapy: WFL for tasks assessed/performed Overall Cognitive Status: Within Functional Limits for tasks assessed    Balance  Static Sitting Balance Static Sitting - Level of Assistance: Not tested (comment) Static Standing Balance Static Standing - Balance Support:  Bilateral upper extremity supported;During  functional activity Static Standing - Level of Assistance: 4: Min assist (using RW and therapist support)  End of Session PT - End of Session Equipment Utilized During Treatment: Gait belt;Oxygen (pt on 28% trach collar on arrival; ambulated pt on 35% trach) Activity Tolerance: Patient limited by fatigue Patient left: in chair;with call bell/phone within reach (with ST in room) Nurse Communication: Mobility status;Need for lift equipment        INGOLD,Thijs Brunton 05/26/2013, 3:45 PM [facility] [phone] (pager)

## 2013-05-26 NOTE — Progress Notes (Signed)
With critical CO2 of 40, MD was paged.

## 2013-05-27 ENCOUNTER — Inpatient Hospital Stay (HOSPITAL_COMMUNITY): Payer: Medicare Other

## 2013-05-27 ENCOUNTER — Ambulatory Visit: Payer: Medicare Other | Admitting: Internal Medicine

## 2013-05-27 ENCOUNTER — Encounter (HOSPITAL_COMMUNITY): Payer: Self-pay | Admitting: Otolaryngology

## 2013-05-27 LAB — TYPE AND SCREEN: Unit division: 0

## 2013-05-27 LAB — BASIC METABOLIC PANEL
BUN: 100 mg/dL — ABNORMAL HIGH (ref 6–23)
CO2: 39 mEq/L — ABNORMAL HIGH (ref 19–32)
Chloride: 100 mEq/L (ref 96–112)
Creatinine, Ser: 2.16 mg/dL — ABNORMAL HIGH (ref 0.50–1.35)
GFR calc Af Amer: 36 mL/min — ABNORMAL LOW (ref >90)
GFR calc non Af Amer: 31 mL/min — ABNORMAL LOW (ref >90)
Glucose, Bld: 160 mg/dL — ABNORMAL HIGH (ref 70–99)
Potassium: 4.1 mEq/L (ref 3.5–5.1)

## 2013-05-27 LAB — CBC
HCT: 32.2 % — ABNORMAL LOW (ref 39.0–52.0)
Hemoglobin: 9.2 g/dL — ABNORMAL LOW (ref 13.0–17.0)
MCH: 27.4 pg (ref 26.0–34.0)
MCHC: 28.6 g/dL — ABNORMAL LOW (ref 30.0–36.0)
MCV: 95.8 fL (ref 78.0–100.0)
Platelets: 147 10*3/uL — ABNORMAL LOW (ref 150–400)

## 2013-05-27 LAB — GLUCOSE, CAPILLARY
Glucose-Capillary: 145 mg/dL — ABNORMAL HIGH (ref 70–99)
Glucose-Capillary: 176 mg/dL — ABNORMAL HIGH (ref 70–99)
Glucose-Capillary: 175 mg/dL — ABNORMAL HIGH (ref 70–99)
Glucose-Capillary: 137 mg/dL — ABNORMAL HIGH (ref 70–99)

## 2013-05-27 MED ORDER — FREE WATER
200.0000 mL | Status: DC
  Administered 2013-05-27 – 2013-05-28 (×6): 200 mL

## 2013-05-27 MED ORDER — PRO-STAT SUGAR FREE PO LIQD
30.0000 mL | Freq: Two times a day (BID) | ORAL | Status: DC
  Administered 2013-05-27 – 2013-06-03 (×14): 30 mL
  Filled 2013-05-27 (×15): qty 30

## 2013-05-27 MED ORDER — GLUCERNA 1.2 CAL PO LIQD
1000.0000 mL | ORAL | Status: DC
  Administered 2013-05-27 – 2013-06-02 (×7): 1000 mL
  Filled 2013-05-27 (×16): qty 1000

## 2013-05-27 NOTE — Progress Notes (Signed)
Speech Language Pathology Treatment: [name] [name] Speaking valve  Patient Details Name: [name] [name] MRN: [id number] DOB: 1951-01-20 Today's Date: 05/27/2013 Time: 1037-1050 SLP Time Calculation (min): 13 min  Assessment / Plan / Recommendation Clinical Impression  F/u with pt after yesterday's PMSV eval.  Per Dr. [name], trach to be downsized to #6 cuffless tomorrow, which should facilitate improved toleration of valve.  Valve was place briefly this morning per spouse's request with expected results consistent with yesterday's assessment - pt not able to utilize upper airway.  Educated pt and wife re: Geologist, engineering of trach and PMSV using booklet/drawings.  Discussed plan to return tomorrow after trach has been downsized - they verbalize agreement.        Pertinent Vitals RR upper 30s; SpO2 95%  SLP Plan  Continue with current plan of care    Recommendations  Await trach downsize for continued PMV use; assess readiness for swallow eval 10/15.      Patient may use Passy-Muir Speech Valve: with SLP only PMSV Supervision: Full       Plan: Continue with current plan of care  [name] [name] L. [name] [name], [place] CCC/SLP Pager [phone]      [name] [name] 05/27/2013, 11:20 AM

## 2013-05-27 NOTE — Progress Notes (Addendum)
PULMONARY  / CRITICAL CARE MEDICINE  Name: [name] [name] MRN: [id number] DOB: July 04, 1951    ADMISSION DATE:  05/20/2013 CONSULTATION DATE:  05/20/2013  REFERRING MD :  EDP PRIMARY SERVICE:  PCCM  CHIEF COMPLAINT:  Dyspnea  BRIEF PATIENT DESCRIPTION: 62 y/o with past medical history of chronic severe restrictive pulmonary secondary to obesity and pleural fibrosis (previously reported as COPD), OSA with poor BiPAP compliance at home, OHS and recent intubation for acute respiratory failure brought to ED with progressive dyspnea and acute encephalopathy.  In ED intubated for airway protection.  PCCM was consulted.  SIGNIFICANT EVENTS / STUDIES:  10/07 - early AM 10/8 - intubated in the ED, admitted to ICU 10/08 - stable on vent, 2D echo >>> LA and LV mildly dilated, mild LVH, EF 55-60%, grade 1 diastolic dysfunction, septum with abnormal function/dyssynergy, mild AS, calcified mitral annulus 10/09 - urinary retention overnight, Foley replaced; ENT consult for tracheostomy eval (Dr. [name]) 10/10 - to OR for trach 10/13- did not tolerate PMV well  LINES / TUBES: ETT 10/7 >> 10/10 Trach 10/10 ([place])>>   CULTURES: 10/7 Urine >>> 25k CFU/mL yeast  ANTIBIOTICS: Levaquin 10/7 >>> 10/7  SUBJECTIVE:  RN reports elevated pCO2 @ 40, concern for gouty flare  VITAL SIGNS: Temp:  [98 F (36.7 C)-98.7 F (37.1 C)] 98.1 F (36.7 C) (10/14 0900) Pulse Rate:  [98-106] 102 (10/14 1016) Resp:  [18-28] 28 (10/14 1016) BP: (99-120)/(51-74) 113/74 mmHg (10/14 1016) SpO2:  [89 %-100 %] 89 % (10/14 1016) FiO2 (%):  [28 %] 28 % (10/14 1016) Weight:  [123.7 kg (272 lb 11.3 oz)] 123.7 kg (272 lb 11.3 oz) (10/14 0426)  VENTILATOR SETTINGS: Vent Mode:  [-]  FiO2 (%):  [28 %] 28 %  INTAKE / OUTPUT: Intake/Output     10/13 0701 - 10/14 0700 10/14 0701 - 10/15 0700   NG/GT 385    Total Intake(mL/kg) 385 (3.1)    Urine (mL/kg/hr) 1800 (0.6) 375 (0.8)   Total Output 1800 375   Net -1415 -375          PHYSICAL EXAMINATION: General:  Alert, no acute distress  Neuro:  A/O x 3 , moves all 4. HEENT:  Trach , nose without purulence, no LN or TMG, some thick clear sec  Cardiovascular:  RRR, no 2-3/6 murmur heard best at 4ICS nipple line Lungs:  Scattered rhonchi  Abdomen:  Soft, nontender, bowel sounds present Musculoskeletal:  Moves all extremities, mild edema   LABS:  CHEMISTRY  Recent Labs Lab 05/20/13 2225 05/22/13 0850 05/23/13 0400 05/25/13 0400 05/26/13 0628 05/27/13 0500  NA 148* 146* 143 144 145 147*  K 5.1 4.7 3.9 4.3 3.9 4.1  CL 97 97 94* 99 98 100  CO2 >45* >45* 44* 37* 40* 39*  GLUCOSE 160* 141* 118* 127* 138* 160*  BUN 28* 57* 63* 76* 84* 100*  CREATININE 1.61* 2.07* 2.02* 1.88* 1.87* 2.16*  CALCIUM 9.4 9.3 9.2 8.9 9.4 9.3  MG 2.3  --   --   --   --   --   PHOS 5.9*  --   --   --   --   --    Estimated Creatinine Clearance: 49.4 ml/min (by C-G formula based on Cr of 2.16). PCXR: basilar atx, overall aeration about the same  ASSESSMENT / PLAN:  PULMONARY A:   Acute on chronic respiratory failure - s/p Trach 10/10  Chronic restrictive pulmonary disease - hx of chronic pleural thickening, obesity.  Unlikely AECOPD  given known restrictive pathology OSA/OHS - with poor BiPAP compliance at home. S/p trach  Will be trach dep indef.  P:   Trach collar as tolerated, sat goal >90% Continue Albuterol / Atrovent SLP to eval for swallowing / PMV, may take a while Hope downsize will improve PMV, ENT to downsize Cuff down as goal Improved pcxr with neg 1.3 liters, hold with crt trend  CARDIOVASCULAR A:  CAD s/p CABG.   Chronic diastolic CHF - without frank exacerbation  Transiently hypotensive after intubation - BP now improved Troponins negative TTE as above - known grade 1 diastolic dysfunction, ?new septal abnormality  P:  Hold Metoprolol, Imdur - consider restart as BP tolerates  RENAL A:   Acute on chronic renal failure  Hypovolemia -  improved P:   Trend BMP Continue free water per tube, follow na Hold diuretics  Probably have maximized lasix  HEMATOLOGIC A:   Anemia - chronic likely related to multiple chronic illness. Hb stable 8-9. Thrombocytopenia - uncertain cause, chronic and present on admission, no obvious bleed P:  Trend CBC SCDs  ENDOCRINE  A:   DM2 - diet controlled P:   CBGs q 8 hrs SSI if glu > 180 Initiate prednisone for acute gout, hold other agents due to renal insufficiency  NEURO/RHEUM A:   Acute encephalopathy - resolved Pain due to ETT - resolved Acute Gout Flare - swollen, painful left 3rd digit of left hand, R elbow P:   PRN analgesia Initiated prednisone for acute gout, hold other agents due to renal insufficiency Mobilize PT ordered 10/10   Mcarthur Rossetti. Tyson Alias, MD, FACP Pgr: [phone] [facility]

## 2013-05-27 NOTE — Progress Notes (Signed)
4 Days Post-Op  Subjective: Has been off ventilator completely for the past couple of days.  Did not tolerate PMV yesterday.  Objective: Vital signs in last 24 hours: Temp:  [98 F (36.7 C)-98.7 F (37.1 C)] 98 F (36.7 C) (10/14 0426) Pulse Rate:  [98-106] 100 (10/14 0426) Resp:  [18-27] 24 (10/14 0426) BP: (99-116)/(47-71) 99/56 mmHg (10/14 0426) SpO2:  [91 %-100 %] 96 % (10/14 0426) FiO2 (%):  [28 %] 28 % (10/14 0426) Weight:  [123.7 kg (272 lb 11.3 oz)] 123.7 kg (272 lb 11.3 oz) (10/14 0426) Last BM Date:  (unknown)  Intake/Output from previous day: 10/13 0701 - 10/14 0700 In: 385 [NG/GT:385] Out: 1800 [Urine:1800] Intake/Output this shift:    General appearance: alert, cooperative and no distress Neck: #8 Shiley trach in place with cuff inflated.  No active bleeding.  Lab Results:   Recent Labs  05/25/13 0400 05/27/13 0500  WBC 6.9 9.7  HGB 9.5* 9.2*  HCT 32.7* 32.2*  PLT 107* 147*   BMET  Recent Labs  05/26/13 0628 05/27/13 0500  NA 145 147*  K 3.9 4.1  CL 98 100  CO2 40* 39*  GLUCOSE 138* 160*  BUN 84* 100*  CREATININE 1.87* 2.16*  CALCIUM 9.4 9.3   PT/INR No results found for this basename: LABPROT, INR,  in the last 72 hours ABG No results found for this basename: PHART, PCO2, PO2, HCO3,  in the last 72 hours  Studies/Results: Dg Chest Port 1 View  05/27/2013   CLINICAL DATA:  Shortness of breath. Assess for airspace disease.  EXAM: PORTABLE CHEST - 1 VIEW  COMPARISON:  05/25/2013  FINDINGS: Tracheostomy and enteric tube in stable position.  Unchanged cardiomegaly. Status post CABG.  Indistinct opacities at the bases, relatively mild and possibly from venous congestion, atelectasis or hypoaeration. Probable small pleural effusions. No edema or pneumothorax.  IMPRESSION: Stable appearance of the chest compared to 2 days prior. No increasing or asymmetric opacity to suggest developing pneumonia.   Electronically Signed   By: Tiburcio Pea M.D.    On: 05/27/2013 06:10    Anti-infectives: Anti-infectives   Start     Dose/Rate Route Frequency Ordered Stop   05/22/13 0800  levofloxacin (LEVAQUIN) IVPB 750 mg  Status:  Discontinued     750 mg 100 mL/hr over 90 Minutes Intravenous Every 24 hours 05/21/13 0332 05/21/13 1039   05/21/13 0400  levofloxacin (LEVAQUIN) IVPB 750 mg     750 mg 100 mL/hr over 90 Minutes Intravenous  Once 05/21/13 0332 05/21/13 1610      Assessment/Plan: s/p Procedure(s): TRACHEOSTOMY (N/A) Will downsize to #6 cuffless tube tomorrow and then resume PMV trials.  LOS: 7 days    Donavyn Fecher 05/27/2013

## 2013-05-27 NOTE — Progress Notes (Addendum)
Physical Therapy Treatment Patient Details Name: [name] [name] MRN: [id number] DOB: 14-Apr-1951 Today's Date: 05/27/2013 Time: 2440-1027 PT Time Calculation (min): 34 min  PT Assessment / Plan / Recommendation  History of Present Illness 62 yo with past medical history of chronic severe restrictive pulmonary secondary to obesity and pleural fibrosis (previously reported as COPD), OSA with poor BiPAP compliance at home, OHS and recent intubation for acute respiratory failure brought to ED with progressive dyspnea and acute encephalopathy.  In ED intubated for airway protection. Trach placed 10/10 and pt now on trach collar.   PT Comments   Pt admitted with above. Pt currently with functional limitations due to the deficits listed below (see PT Problem List). Pt will need SNF with therapy to reach Modif I to go home with wife. Pt very motivated and works hard in therapy.    Pt will benefit from skilled PT to increase their independence and safety with mobility to allow discharge to the venue listed below.   Follow Up Recommendations  SNF;Supervision/Assistance - 24 hour                 Equipment Recommendations  Other (comment) (TBA)        Frequency Min 3X/week   Progress towards PT Goals Progress towards PT goals: Progressing toward goals  Plan Discharge plan needs to be updated    Precautions / Restrictions Precautions Precautions: Fall Restrictions Weight Bearing Restrictions: No   Pertinent Vitals/Pain VSS, No pain    Mobility  Bed Mobility Bed Mobility: Not assessed Transfers Transfers: Sit to Stand;Stand to Sit Sit to Stand: 1: +2 Total assist;With upper extremity assist;From chair/3-in-1;With armrests Sit to Stand: Patient Percentage: 50% Stand to Sit: 1: +2 Total assist;With upper extremity assist;To chair/3-in-1;With armrests Stand to Sit: Patient Percentage: 50% Stand Pivot Transfers: Not tested (comment) Details for Transfer Assistance: Pt in low recliner  therefore needed assist with pad to acheive sit to stand from low surface even with pillows.  Pt having more difficulty iniating stand today questionably secondary to pt stating his LEs were hurting more today.  Needed assist to control descent into chair as well.   Ambulation/Gait Ambulation/Gait Assistance: 1: +2 Total assist Ambulation/Gait: Patient Percentage: 70% Ambulation Distance (Feet): 24 Feet Assistive device: Rolling walker Ambulation/Gait Assistance Details: Pt ambulated with RW with cues for sequencing steps and RW.  Needed assist to weight shift as well.  Cues for upright posture.  Assisted with steering RW.  Bil knee instability continues following pt with chair.   Gait Pattern: Step-to pattern;Decreased stride length;Decreased step length - left;Wide base of support;Trunk flexed Gait velocity: decreased Stairs: No Wheelchair Mobility Wheelchair Mobility: No    Exercises General Exercises - Lower Extremity Ankle Circles/Pumps: AROM;Both;10 reps;Seated Long Arc Quad: AROM;Both;10 reps;Seated Hip Flexion/Marching: AROM;Both;10 reps;Seated   PT Goals (current goals can now be found in the care plan section)    Visit Information  Last PT Received On: 05/27/13 Assistance Needed: +2 History of Present Illness: 62 yo with past medical history of chronic severe restrictive pulmonary secondary to obesity and pleural fibrosis (previously reported as COPD), OSA with poor BiPAP compliance at home, OHS and recent intubation for acute respiratory failure brought to ED with progressive dyspnea and acute encephalopathy.  In ED intubated for airway protection. Trach placed 10/10 and pt now on trach collar.    Subjective Data  Subjective: Trach in place.  Pt mouths, "I will try."   Cognition  Cognition Arousal/Alertness: Awake/alert Behavior During Therapy: [facility]  for tasks assessed/performed Overall Cognitive Status: Within Functional Limits for tasks assessed    Balance  Static  Standing Balance Static Standing - Balance Support: Bilateral upper extremity supported;During functional activity Static Standing - Level of Assistance: 4: Min assist (using RW and therapist support)  End of Session PT - End of Session Equipment Utilized During Treatment: Gait belt;Oxygen (pt on 28% trach collar on arrival; ambulated pt on 35% trach) Activity Tolerance: Patient limited by fatigue Patient left: in chair;with call bell/phone within reach Nurse Communication: Mobility status;Need for lift equipment       INGOLD,Mylon Mabey 05/27/2013, 11:44 AM  [facility] [phone] (pager)

## 2013-05-27 NOTE — Progress Notes (Addendum)
NUTRITION FOLLOW UP  Intervention:    Change EN regimen to Glucerna 1.2 formula -- initiate at 20 ml/hr and increase by 10 ml every 4 hours to goal rate of 70 ml/hr with Prostat liquid protein 30 ml twice daily via tube to provide 2216 kcals, 131 gm protein, 1352 ml of free water RD to follow for nutrition care plan  Nutrition Dx:   Inadequate oral intake related to inability to eat as evidenced by NPO status, ongoing  New Goal:   EN to meet > 90% of estimated nutrition needs, currently unmet  Monitor:   EN regimen & tolerance, respiratory status, weight, labs, I/O's  Assessment:   Patient with history of COPD, OSA, and OHS, and recent intubation for acute respiratory failure admitted with dyspnea.   Patient s/p procedure 10/11:  TRACHEOSTOMY  Patient currently off ventilatory support -- on ATC.  Vital HP formula infusing at goal rate of 35 ml/hr via NGT with Prostat liquid protein 60 ml 3 times daily via tube to provide 1440 kcals (64% of estimated kcal needs), 163 gm protein (100% of estimated protein needs), 702 ml of free water.  Free water flushes at 200 ml every 4 hours.  RD with EN management privileges, initially consulted 10/8.  Height: Ht Readings from Last 1 Encounters:  05/20/13 6' 0.83" (1.85 m)    Weight Status:   Wt Readings from Last 1 Encounters:  05/27/13 272 lb 11.3 oz (123.7 kg)    Body mass index is 36.14 kg/(m^2).  Re-estimated needs:  Kcal: 2200-2300 Protein: 125-135 gm Fluid: 2.2-2.4 L  Skin: neck incision   Diet Order: NPO   Intake/Output Summary (Last 24 hours) at 05/27/13 1500 Last data filed at 05/27/13 0905  Gross per 24 hour  Intake    105 ml  Output   1625 ml  Net  -1520 ml    Labs:   Recent Labs Lab 05/20/13 2225  05/25/13 0400 05/26/13 0628 05/27/13 0500  NA 148*  < > 144 145 147*  K 5.1  < > 4.3 3.9 4.1  CL 97  < > 99 98 100  CO2 >45*  < > 37* 40* 39*  BUN 28*  < > 76* 84* 100*  CREATININE 1.61*  < > 1.88* 1.87*  2.16*  CALCIUM 9.4  < > 8.9 9.4 9.3  MG 2.3  --   --   --   --   PHOS 5.9*  --   --   --   --   GLUCOSE 160*  < > 127* 138* 160*  < > = values in this interval not displayed.  CBG (last 3)   Recent Labs  05/27/13 0427 05/27/13 0902 05/27/13 1240  GLUCAP 145* 176* 214*    Scheduled Meds: . albuterol  2.5 mg Nebulization Q6H  . antiseptic oral rinse  1 application Mouth Rinse QID  . aspirin  81 mg Per Tube Daily  . atorvastatin  40 mg Per Tube q1800  . chlorhexidine  15 mL Mouth/Throat BID  . clopidogrel  75 mg Per Tube Daily  . famotidine  20 mg Per Tube BID  . feeding supplement (PRO-STAT SUGAR FREE 64)  60 mL Per Tube TID  . free water  200 mL Per Tube Q4H  . ipratropium  0.5 mg Nebulization Q6H  . predniSONE  30 mg Oral Q breakfast  . traMADol  25 mg Per Tube Q6H    Continuous Infusions: . sodium chloride 10 mL/hr (05/21/13 0956)  .  dextrose 10 mL/hr at 05/23/13 0751  . feeding supplement (VITAL HIGH PROTEIN) 1,000 mL (05/26/13 1319)    Maureen Chatters, RD, LDN Pager #: [phone] After-Hours Pager #: [phone]

## 2013-05-27 NOTE — Clinical Social Work Note (Signed)
CSW informed via RNCM that pt will need SNF. CSW will complete assessment and facilitate this process tomorrow.    Maryclare Labrador, MSW, [facility] Clinical Social Worker [phone]

## 2013-05-28 LAB — COMPREHENSIVE METABOLIC PANEL
AST: 9 U/L (ref 0–37)
Albumin: 3 g/dL — ABNORMAL LOW (ref 3.5–5.2)
BUN: 109 mg/dL — ABNORMAL HIGH (ref 6–23)
CO2: 40 mEq/L (ref 19–32)
Calcium: 9.7 mg/dL (ref 8.4–10.5)
Creatinine, Ser: 2.19 mg/dL — ABNORMAL HIGH (ref 0.50–1.35)
GFR calc non Af Amer: 31 mL/min — ABNORMAL LOW (ref >90)
Sodium: 148 mEq/L — ABNORMAL HIGH (ref 135–145)
Total Bilirubin: 0.3 mg/dL (ref 0.3–1.2)
Total Protein: 7.8 g/dL (ref 6.0–8.3)

## 2013-05-28 LAB — GLUCOSE, CAPILLARY
Glucose-Capillary: 169 mg/dL — ABNORMAL HIGH (ref 70–99)
Glucose-Capillary: 216 mg/dL — ABNORMAL HIGH (ref 70–99)
Glucose-Capillary: 201 mg/dL — ABNORMAL HIGH (ref 70–99)
Glucose-Capillary: 163 mg/dL — ABNORMAL HIGH (ref 70–99)

## 2013-05-28 LAB — CBC
MCH: 27.3 pg (ref 26.0–34.0)
MCV: 97.3 fL (ref 78.0–100.0)
Platelets: 181 10*3/uL (ref 150–400)
RDW: 15.6 % — ABNORMAL HIGH (ref 11.5–15.5)

## 2013-05-28 LAB — BLOOD GAS, ARTERIAL
Acid-Base Excess: 13.2 mmol/L — ABNORMAL HIGH (ref 0.0–2.0)
Bicarbonate: 40.1 mEq/L — ABNORMAL HIGH (ref 20.0–24.0)
Drawn by: 36496
FIO2: 0.28 %
O2 Saturation: 94.2 %
Patient temperature: 98.6
TCO2: 42.8 mmol/L (ref 0–100)
pH, Arterial: 7.286 — ABNORMAL LOW (ref 7.350–7.450)

## 2013-05-28 MED ORDER — PREDNISONE 20 MG PO TABS
20.0000 mg | ORAL_TABLET | Freq: Every day | ORAL | Status: DC
  Administered 2013-05-29: 20 mg via ORAL
  Filled 2013-05-28 (×2): qty 1

## 2013-05-28 MED ORDER — FREE WATER
300.0000 mL | Status: DC
  Administered 2013-05-28 – 2013-05-30 (×13): 300 mL

## 2013-05-28 NOTE — Clinical Social Work Note (Signed)
Pt's wife Corrie Dandy also provided home phone number: [phone].   Maryclare Labrador, MSW, [facility] Clinical Social Worker [phone]

## 2013-05-28 NOTE — Progress Notes (Signed)
SLP Cancellation Note  Patient Details Name: KARMA HINEY MRN: [id number] DOB: [birth year]   Cancelled treatment:       Reason Eval/Treat Not Completed: Medical issues which prohibited therapy. Patient with increased bleeding at trach site s/p trach change this am. Discussed with RN. Agreed to hold off on PMSV placement until bleeding decreases. Will f/u either this pm or in am 10/16.  [name] MA, CCC-SLP [phone]    Aziel Morgan [name] 05/28/2013, 9:24 AM

## 2013-05-28 NOTE — Progress Notes (Signed)
5 Days Post-Op  Subjective: Continues to do well off the ventilator.  Objective: Vital signs in last 24 hours: Temp:  [97.8 F (36.6 C)-99.1 F (37.3 C)] 97.8 F (36.6 C) (10/15 0700) Pulse Rate:  [78-104] 91 (10/15 0700) Resp:  [14-28] 16 (10/15 0700) BP: (104-120)/(53-76) 113/59 mmHg (10/15 0700) SpO2:  [89 %-100 %] 98 % (10/15 0700) FiO2 (%):  [28 %] 28 % (10/15 0700) Last BM Date: 05/27/13  Intake/Output from previous day: 10/14 0701 - 10/15 0700 In: 1265 [NG/GT:1265] Out: 2075 [Urine:2075] Intake/Output this shift:    General appearance: alert, cooperative and no distress Neck: #8 cuffed Shiley in place.  No active bleeding.  Lab Results:   Recent Labs  05/27/13 0500 05/28/13 0527  WBC 9.7 9.8  HGB 9.2* 9.2*  HCT 32.2* 32.8*  PLT 147* 181   BMET  Recent Labs  05/27/13 0500 05/28/13 0527  NA 147* 148*  K 4.1 4.2  CL 100 101  CO2 39* 40*  GLUCOSE 160* 165*  BUN 100* 109*  CREATININE 2.16* 2.19*  CALCIUM 9.3 9.7   PT/INR No results found for this basename: LABPROT, INR,  in the last 72 hours ABG  Recent Labs  05/28/13 0456  PHART 7.286*  HCO3 40.1*    Studies/Results: Dg Chest Port 1 View  05/27/2013   CLINICAL DATA:  Shortness of breath. Assess for airspace disease.  EXAM: PORTABLE CHEST - 1 VIEW  COMPARISON:  05/25/2013  FINDINGS: Tracheostomy and enteric tube in stable position.  Unchanged cardiomegaly. Status post CABG.  Indistinct opacities at the bases, relatively mild and possibly from venous congestion, atelectasis or hypoaeration. Probable small pleural effusions. No edema or pneumothorax.  IMPRESSION: Stable appearance of the chest compared to 2 days prior. No increasing or asymmetric opacity to suggest developing pneumonia.   Electronically Signed   By: Tiburcio Pea M.D.   On: 05/27/2013 06:10    Anti-infectives: Anti-infectives   Start     Dose/Rate Route Frequency Ordered Stop   05/22/13 0800  levofloxacin (LEVAQUIN) IVPB 750  mg  Status:  Discontinued     750 mg 100 mL/hr over 90 Minutes Intravenous Every 24 hours 05/21/13 0332 05/21/13 1039   05/21/13 0400  levofloxacin (LEVAQUIN) IVPB 750 mg     750 mg 100 mL/hr over 90 Minutes Intravenous  Once 05/21/13 0332 05/21/13 0611      Assessment/Plan: s/p Procedure(s): TRACHEOSTOMY (N/A) Tracheostomy tube changed to #6 cuffless tube with ease.  Able to make voice around blocked tube.  Speech path can return to try PMV trial again.  He will be discharged with this trach tube and needs trach teaching and a suction machine arranged for home.  LOS: 8 days    [name] [name] 05/28/2013

## 2013-05-28 NOTE — Clinical Social Work Note (Signed)
Clinical Social Work Department BRIEF PSYCHOSOCIAL ASSESSMENT 05/28/2013  Patient:  [name] [name], [name] [name]     Account Number:  [id number]     Admit date:  05/20/2013  Clinical Social Worker:  Varney Biles  Date/Time:  05/28/2013 01:13 PM  Referred by:  Physician  Date Referred:  05/28/2013 Referred for  SNF Placement   Other Referral:   Interview type:  Patient Other interview type:   CSW also spoke with pt's wife, who was in the room during CSW visit.    PSYCHOSOCIAL DATA Living Status:  FAMILY Admitted from facility:   Level of care:   Primary support name:  [name] [name] Primary support relationship to patient:   Degree of support available:   Good--pt's wife visits him regularly in hospital, and pt was living at home with wife before hospital admission. Pt also has a sister named [name] [name] and a daughter, [name] [name].    CURRENT CONCERNS Current Concerns  Post-Acute Placement   Other Concerns:    SOCIAL WORK ASSESSMENT / PLAN CSW explained that when pt is medically ready for discharge, he would benefit from a SNF. CSW explained SNF search and CSW role, and family gave CSW permission to send pt's clinicals to SNFs in [place]. Wife explains that she and pt live in [place], and something close to home would be ideal. CSW has sent clinicals to SNFs in [facility] and will provide bed offers when available.   Assessment/plan status:  Psychosocial Support/Ongoing Assessment of Needs Other assessment/ plan:   Information/referral to community resources:   SNF.    PATIENT'S/FAMILY'S RESPONSE TO PLAN OF CARE: Pt and his wife understanding of CSW role and agreeable to SNF search. Expressed some concern about how SNF stay will be paid for--CSW explained that they can call the phone number on their insurance card and ask questions or they can contact individual facilities once bed offers have been given to ask about insurance coverage. Pt and wife understanding and  accepting of this.      Maryclare Labrador, MSW, [facility] Clinical Social Worker [phone]

## 2013-05-28 NOTE — Progress Notes (Signed)
Leaving patient on 28% ATC after attaining a blood gas and consulting MD.

## 2013-05-28 NOTE — Progress Notes (Signed)
CRITICAL VALUE ALERT  Critical value received:  CO2: 87.1  Date of notification:  05/28/2013  Time of notification:  0510  Critical value read back:yes  Nurse who received alert:  Fransisco Hertz, RN   MD notified (1st page):  DR. Darrick Penna  Time of first page:  0510  MD notified (2nd page):  Time of second page:  Responding MD:  Dr. Darrick Penna  Time MD responded:  0510  No new orders received.

## 2013-05-28 NOTE — Clinical Social Work Placement (Signed)
Clinical Social Work Department CLINICAL SOCIAL WORK PLACEMENT NOTE 05/28/2013  Patient:  [name] [name], [name] [name]  Account Number:  [id number] Admit date:  05/20/2013  Clinical Social Worker:  Maryclare Labrador, Theresia Majors  Date/time:  05/28/2013 01:18 PM  Clinical Social Work is seeking post-discharge placement for this patient at the following level of care:   SKILLED NURSING   (*CSW will update this form in Epic as items are completed)   05/28/2013  Patient/family provided with [facility] Department of Clinical Social Work's list of facilities offering this level of care within the geographic area requested by the patient (or if unable, by the patient's family).  05/28/2013  Patient/family informed of their freedom to choose among providers that offer the needed level of care, that participate in Medicare, Medicaid or managed care program needed by the patient, have an available bed and are willing to accept the patient.  05/28/2013  Patient/family informed of [facility]' ownership interest in [facility], as well as of the fact that they are under no obligation to receive care at this facility.  PASARR submitted to EDS on 05/28/2013 PASARR number received from EDS on 05/28/2013  FL2 transmitted to all facilities in geographic area requested by pt/family on  05/28/2013 FL2 transmitted to all facilities within larger geographic area on   Patient informed that his/her managed care company has contracts with or will negotiate with  certain facilities, including the following:     Patient/family informed of bed offers received:   Patient chooses bed at  Physician recommends and patient chooses bed at    Patient to be transferred to  on   Patient to be transferred to facility by   The following physician request were entered in Epic:   Additional Comments:    Maryclare Labrador, MSW, [facility] Clinical Social Worker [phone]

## 2013-05-28 NOTE — Progress Notes (Signed)
PULMONARY  / CRITICAL CARE MEDICINE  Name: [name] [name] MRN: [id number] DOB: 1951-03-15    ADMISSION DATE:  05/20/2013 CONSULTATION DATE:  05/20/2013  REFERRING MD :  EDP PRIMARY SERVICE:  PCCM  CHIEF COMPLAINT:  Dyspnea  BRIEF PATIENT DESCRIPTION: 62 y/o with past medical history of chronic severe restrictive pulmonary secondary to obesity and pleural fibrosis (previously reported as COPD), OSA with poor BiPAP compliance at home, OHS and recent intubation for acute respiratory failure brought to ED with progressive dyspnea and acute encephalopathy.  In ED intubated for airway protection.  PCCM was consulted.  SIGNIFICANT EVENTS / STUDIES:  10/07 - early AM 10/8 - intubated in the ED, admitted to ICU 10/08 - stable on vent, 2D echo >>> LA and LV mildly dilated, mild LVH, EF 55-60%, grade 1 diastolic dysfunction, septum with abnormal function/dyssynergy, mild AS, calcified mitral annulus 10/09 - urinary retention overnight, Foley replaced; ENT consult for tracheostomy eval (Dr. [name]) 10/10 - to OR for trach 10/13- did not tolerate PMV well 10/15- acidotic  At 5 am abg  LINES / TUBES: ETT 10/7 >> 10/10 Trach 10/10 ([facility])>>   CULTURES: 10/7 Urine >>> 25k CFU/mL yeast  ANTIBIOTICS: Levaquin 10/7 >>> 10/7  SUBJECTIVE:  abg acidotic am  VITAL SIGNS: Temp:  [97.8 F (36.6 C)-99.1 F (37.3 C)] 97.8 F (36.6 C) (10/15 0700) Pulse Rate:  [78-104] 86 (10/15 0900) Resp:  [14-24] 20 (10/15 0900) BP: (104-125)/(53-76) 112/59 mmHg (10/15 0900) SpO2:  [94 %-100 %] 97 % (10/15 0900) FiO2 (%):  [28 %] 28 % (10/15 0800)  VENTILATOR SETTINGS: Vent Mode:  [-]  FiO2 (%):  [28 %] 28 %  INTAKE / OUTPUT: Intake/Output     10/14 0701 - 10/15 0700 10/15 0701 - 10/16 0700   NG/GT 1325 410   Total Intake(mL/kg) 1325 (10.7) 410 (3.3)   Urine (mL/kg/hr) 2075 (0.7) 550 (1)   Total Output 2075 550   Net -750 -140         PHYSICAL EXAMINATION: General:  Alert, no acute distress   Neuro:  A/O x 3 , moves all 4. HEENT:  Trach , nose without purulence, no LN or TMG, some thick clear sec  Cardiovascular:  RRR, no 2-3/6 murmur heard best at 4ICS nipple line Lungs:  Scattered rhonchi  Abdomen:  Soft, nontender, bowel sounds present Musculoskeletal:  Moves all extremities, mild edema   LABS:  CHEMISTRY  Recent Labs Lab 05/23/13 0400 05/25/13 0400 05/26/13 0628 05/27/13 0500 05/28/13 0527  NA 143 144 145 147* 148*  K 3.9 4.3 3.9 4.1 4.2  CL 94* 99 98 100 101  CO2 44* 37* 40* 39* 40*  GLUCOSE 118* 127* 138* 160* 165*  BUN 63* 76* 84* 100* 109*  CREATININE 2.02* 1.88* 1.87* 2.16* 2.19*  CALCIUM 9.2 8.9 9.4 9.3 9.7   Estimated Creatinine Clearance: 48.7 ml/min (by C-G formula based on Cr of 2.19). PCXR: basilar atx, overall aeration about the same  ASSESSMENT / PLAN:  PULMONARY A:   Acute on chronic respiratory failure - s/p Trach 10/10  Chronic restrictive pulmonary disease - hx of chronic pleural thickening, obesity.  Unlikely AECOPD  given known restrictive pathology OSA/OHS - with poor BiPAP compliance at home. S/p trach  Will be trach dep indef.  Down sized to 6 (cuffless) 10/15 Still has a lot of secretions s/p trach change  P:   Trach collar as tolerated, sat goal >90% Continue Albuterol / Atrovent SLP to eval for swallowing / PMV,  may take a while-->suspect we may need to give him a day or so given recent trach change  Cuff down as goal With abg this am noted, requires nocturnal vent for ohs  CARDIOVASCULAR A:  CAD s/p CABG.   Chronic diastolic CHF - without frank exacerbation  Transiently hypotensive after intubation - BP now improved Troponins negative TTE as above - known grade 1 diastolic dysfunction, ?new septal abnormality  P:  Hold Metoprolol, Imdur - consider restart as BP tolerates  RENAL A:   Acute on chronic renal failure  Scr remains elevated. Think he has a significant amount of insensible loss from his trach  secretions   P:   Trend BMP Continue free water per tube, follow na Hold diuretics    HEMATOLOGIC A:   Anemia - chronic likely related to multiple chronic illness. Hb stable 8-9. Thrombocytopenia - uncertain cause, chronic and present on admission, no obvious bleed P:  Trend CBC SCDs  ENDOCRINE  A:   DM2 - diet controlled P:   CBGs q 8 hrs SSI if glu > 180 Cont pred for gout, will taper as tolerated   NEURO/RHEUM A:   Acute encephalopathy - resolved Pain due to ETT - resolved Acute Gout Flare - swollen, painful left 3rd digit of left hand, R elbow P:   PRN analgesia Initiated prednisone for acute gout, hold other agents due to renal insufficiency Mobilize PT ordered 10/10   Mcarthur Rossetti. Tyson Alias, MD, FACP Pgr: [phone] [place]

## 2013-05-29 ENCOUNTER — Inpatient Hospital Stay (HOSPITAL_COMMUNITY): Payer: Medicare Other

## 2013-05-29 LAB — BASIC METABOLIC PANEL
CO2: 39 mEq/L — ABNORMAL HIGH (ref 19–32)
Chloride: 99 mEq/L (ref 96–112)
Creatinine, Ser: 2.1 mg/dL — ABNORMAL HIGH (ref 0.50–1.35)
GFR calc Af Amer: 37 mL/min — ABNORMAL LOW (ref >90)
Potassium: 4.3 mEq/L (ref 3.5–5.1)

## 2013-05-29 LAB — GLUCOSE, CAPILLARY
Glucose-Capillary: 183 mg/dL — ABNORMAL HIGH (ref 70–99)
Glucose-Capillary: 185 mg/dL — ABNORMAL HIGH (ref 70–99)
Glucose-Capillary: 169 mg/dL — ABNORMAL HIGH (ref 70–99)

## 2013-05-29 LAB — BLOOD GAS, ARTERIAL
Bicarbonate: 39.7 mEq/L — ABNORMAL HIGH (ref 20.0–24.0)
Drawn by: 34762
O2 Saturation: 92.3 %
PEEP: 5 cmH2O
RATE: 14 resp/min
TCO2: 41.9 mmol/L (ref 0–100)
pCO2 arterial: 69.1 mmHg (ref 35.0–45.0)
pH, Arterial: 7.372 (ref 7.350–7.450)
pO2, Arterial: 65.3 mmHg — ABNORMAL LOW (ref 80.0–100.0)

## 2013-05-29 LAB — PROCALCITONIN: Procalcitonin: 2.32 ng/mL

## 2013-05-29 MED ORDER — PREDNISONE 10 MG PO TABS
10.0000 mg | ORAL_TABLET | Freq: Every day | ORAL | Status: AC
  Administered 2013-05-30 – 2013-05-31 (×2): 10 mg via ORAL
  Filled 2013-05-29 (×2): qty 1

## 2013-05-29 MED ORDER — INSULIN ASPART 100 UNIT/ML ~~LOC~~ SOLN
0.0000 [IU] | SUBCUTANEOUS | Status: DC
  Administered 2013-05-29 – 2013-05-31 (×10): 3 [IU] via SUBCUTANEOUS
  Administered 2013-05-31: 5 [IU] via SUBCUTANEOUS
  Administered 2013-05-31 – 2013-06-01 (×8): 3 [IU] via SUBCUTANEOUS
  Administered 2013-06-02 (×3): 2 [IU] via SUBCUTANEOUS
  Administered 2013-06-02: 3 [IU] via SUBCUTANEOUS
  Administered 2013-06-02 – 2013-06-03 (×3): 2 [IU] via SUBCUTANEOUS
  Administered 2013-06-03: 3 [IU] via SUBCUTANEOUS
  Administered 2013-06-03 (×3): 2 [IU] via SUBCUTANEOUS
  Administered 2013-06-03 – 2013-06-04 (×2): 3 [IU] via SUBCUTANEOUS
  Administered 2013-06-04: 2 [IU] via SUBCUTANEOUS
  Administered 2013-06-04 (×2): 3 [IU] via SUBCUTANEOUS
  Administered 2013-06-04: 2 [IU] via SUBCUTANEOUS
  Administered 2013-06-05 (×2): 3 [IU] via SUBCUTANEOUS
  Administered 2013-06-05 (×2): 2 [IU] via SUBCUTANEOUS
  Administered 2013-06-05 (×2): 3 [IU] via SUBCUTANEOUS
  Administered 2013-06-06 (×2): 2 [IU] via SUBCUTANEOUS
  Administered 2013-06-06: 3 [IU] via SUBCUTANEOUS
  Administered 2013-06-06: 2 [IU] via SUBCUTANEOUS
  Administered 2013-06-06: 3 [IU] via SUBCUTANEOUS
  Administered 2013-06-06 – 2013-06-07 (×4): 2 [IU] via SUBCUTANEOUS
  Administered 2013-06-07: 5 [IU] via SUBCUTANEOUS
  Administered 2013-06-07: 3 [IU] via SUBCUTANEOUS
  Administered 2013-06-07: 2 [IU] via SUBCUTANEOUS
  Administered 2013-06-08: 3 [IU] via SUBCUTANEOUS
  Administered 2013-06-08: 5 [IU] via SUBCUTANEOUS
  Administered 2013-06-08: 3 [IU] via SUBCUTANEOUS
  Administered 2013-06-08: 2 [IU] via SUBCUTANEOUS
  Administered 2013-06-08: 5 [IU] via SUBCUTANEOUS
  Administered 2013-06-09: 3 [IU] via SUBCUTANEOUS
  Administered 2013-06-09: 5 [IU] via SUBCUTANEOUS
  Administered 2013-06-09: 3 [IU] via SUBCUTANEOUS
  Administered 2013-06-10: 8 [IU] via SUBCUTANEOUS
  Administered 2013-06-10 (×2): 2 [IU] via SUBCUTANEOUS
  Administered 2013-06-11: 3 [IU] via SUBCUTANEOUS
  Administered 2013-06-11 (×2): 5 [IU] via SUBCUTANEOUS

## 2013-05-29 MED ORDER — LORAZEPAM 2 MG/ML IJ SOLN
0.5000 mg | Freq: Once | INTRAMUSCULAR | Status: AC
  Administered 2013-05-29: 0.5 mg via INTRAVENOUS
  Filled 2013-05-29: qty 1

## 2013-05-29 NOTE — Progress Notes (Signed)
[facility] PCCM  Called to bedside because patient was not tolerating vent with cuffless trache.  Lots of coughing, felt uncomfortable, air coming through mouth.  Very low exhaled volumes, difficult triggering vent.  Tried closed mouth breathing, suctioning.  Tried multiple vent modes: PSV, PRVC, ACPC, none of these helped.    Changed trache to 6 shiley cuffed.  Good volumes on vent, normal oxygenation, good color change on end-tidal CO2 detector.  0.5mg  ativan given to help patient rest has he was quite anxious with the trach change, trouble with mechanical ventilation.  Yolonda Kida PCCM Pager: [phone] Cell: [phone] If no response, call [phone]

## 2013-05-29 NOTE — Progress Notes (Signed)
PULMONARY  / CRITICAL CARE MEDICINE  Name: [name] [name] MRN: [id number] DOB: 14-Apr-1951    ADMISSION DATE:  05/20/2013 CONSULTATION DATE:  05/20/2013  REFERRING MD :  EDP PRIMARY SERVICE:  PCCM  CHIEF COMPLAINT:  Dyspnea  BRIEF PATIENT DESCRIPTION: 62 y/o with past medical history of chronic severe restrictive pulmonary secondary to obesity and pleural fibrosis (previously reported as COPD), OSA with poor BiPAP compliance at home, OHS and recent intubation for acute respiratory failure brought to ED with progressive dyspnea and acute encephalopathy.  In ED intubated for airway protection.  PCCM was consulted.  SIGNIFICANT EVENTS / STUDIES:  10/07 - early AM 10/8 - intubated in the ED, admitted to ICU 10/08 - stable on vent, 2D echo >>> LA and LV mildly dilated, mild LVH, EF 55-60%, grade 1 diastolic dysfunction, septum with abnormal function/dyssynergy, mild AS, calcified mitral annulus 10/09 - urinary retention overnight, Foley replaced; ENT consult for tracheostomy eval (Dr. [name]) 10/10 - to OR for trach 10/13- did not tolerate PMV well 10/15- acidotic  At 5 am abg 10/16- Trach change to 6 Shiley cuffed  LINES / TUBES: ETT 10/7 >> 10/10 Trach 10/10 ([facility])>>  Trach 10/15>> Downsized to 6 Shiley uncuffed  CULTURES: 10/7 Urine >>> 25k CFU/mL yeast Sputum 10/16>>>  ANTIBIOTICS: Levaquin 10/7 >>> 10/7  SUBJECTIVE:  Dyspneic, uncomfortable while on 30% TC. Increased secretions.    VITAL SIGNS: Temp:  [97.6 F (36.4 C)-98.3 F (36.8 C)] 98 F (36.7 C) (10/16 0747) Pulse Rate:  [81-95] 95 (10/16 1000) Resp:  [15-28] 18 (10/16 1000) BP: (91-160)/(49-82) 134/76 mmHg (10/16 1000) SpO2:  [90 %-100 %] 93 % (10/16 1000) FiO2 (%):  [28 %-30 %] 30 % (10/16 0814)  VENTILATOR SETTINGS: Vent Mode:  [-] PRVC FiO2 (%):  [28 %-30 %] 30 % Set Rate:  [14 bmp] 14 bmp Vt Set:  [450 mL] 450 mL PEEP:  [5 cmH20] 5 cmH20 Plateau Pressure:  [13 cmH20-17 cmH20] 13 cmH20  INTAKE /  OUTPUT: Intake/Output     10/15 0701 - 10/16 0700 10/16 0701 - 10/17 0700   I.V. (mL/kg) 510 (4.1)    NG/GT 1570    Total Intake(mL/kg) 2080 (16.8)    Urine (mL/kg/hr) 3450 (1.2) 425 (1)   Total Output 3450 425   Net -1370 -425         PHYSICAL EXAMINATION: General:  Alert, uncomfortable, but calm.  Attempts communication.  Neuro:  A/O x 3 , moves all 4. HEENT:  Trach scant frank bleeding at base, creamy, thick, non-oddoress secretions , nose without purulence, no LN or TMG.   Cardiovascular:  RRR, no 2-3/6 murmur heard best at 4ICS nipple line Lungs:  Scattered rhonchi  Abdomen:  Soft, nontender, bowel sounds present Musculoskeletal:  Moves all extremities, mild edema. Swollen, tender 3rd finger on left hand. Left and right elbow tender and warm to touch.    LABS:  CHEMISTRY  Recent Labs Lab 05/25/13 0400 05/26/13 0628 05/27/13 0500 05/28/13 0527 05/29/13 0525  NA 144 145 147* 148* 146*  K 4.3 3.9 4.1 4.2 4.3  CL 99 98 100 101 99  CO2 37* 40* 39* 40* 39*  GLUCOSE 127* 138* 160* 165* 213*  BUN 76* 84* 100* 109* 113*  CREATININE 1.88* 1.87* 2.16* 2.19* 2.10*  CALCIUM 8.9 9.4 9.3 9.7 9.4   Estimated Creatinine Clearance: 50.8 ml/min (by C-G formula based on Cr of 2.1). PCXR: Unchanged lower opacities. Multiple small pleural effusions.   ASSESSMENT / PLAN:  PULMONARY  A:   Acute on chronic respiratory failure - s/p Trach 10/10  Chronic restrictive pulmonary disease - hx of chronic pleural thickening, obesity.  Unlikely AECOPD  given known restrictive pathology OSA/OHS - with poor BiPAP compliance at home. S/p trach  Will be trach dep indef.  Down sized to 6 (cuffless) 10/15 Changed to 6 cuffed 10/16 due to large air leak and low exhaled volumes Increased tracheal secretions with increased viscosity   P:   - Consider 6 Shiley XLT to eliminate air leak and need for nocturnal vent - Send sputum culture  - Trach collar as tolerated, sat goal >90% - Continue  Albuterol / Atrovent - With abg this am noted - SLP evaluation when ready   CARDIOVASCULAR A:  CAD s/p CABG.   Chronic diastolic CHF - without frank exacerbation  Transiently hypotensive after intubation - BP now improved Troponins negative TTE as above - known grade 1 diastolic dysfunction, ?new septal abnormality  P:  Hold Metoprolol, Imdur - consider restart as BP tolerates  RENAL A:   Acute on chronic renal failure  - Continued significant amount of insensible loss from trach secretions   P:   - Trend BMP - Continue free water per tube, follow na -  Hold diuretics , follow trend crt with this approach   HEMATOLOGIC A:   Anemia - chronic likely related to multiple chronic illness. Hb stable 8-9. Thrombocytopenia - uncertain cause, chronic and present on admission, no obvious bleed P:  - Trend CBC - SCDs - Ambulation per PT as tolerated   ENDOCRINE  A:   DM2 - diet controlled P:   - CBGs q 8 hrs - SSI if glu > 180   NEURO/RHEUM A:   Acute encephalopathy - resolved Pain due to ETT - improving  Acute Gout Flare - swollen, painful left 3rd digit of left hand, R elbow P:   - PRN analgesia - Initiated prednisone for acute gout, hold other agents due to renal insufficiency - Mobilize per PT   Evaluate need for XLT trach due to last-nights concerns with Dr. Celestia Khat.  Patient and family have been made abreast to care regarding trach and nocturnal ventilatory support. Case management currently working on home health plan.   Pedro Earls, NP-S PCCM  Mcarthur Rossetti. Tyson Alias, MD, FACP Pgr: [phone] [place] [facility]

## 2013-05-29 NOTE — Clinical Social Work Note (Signed)
Per RNCM, pt's wife will be completing vent training to take pt home. CSW continues to follow case and collaborate with RNCM.    Maryclare Labrador, MSW, [facility] Clinical Social Worker [phone]

## 2013-05-29 NOTE — Progress Notes (Signed)
Placed pt. On vent per MD order for nocturnal rest.  Pt. Coughing and  Very uncomfortable on vent.  Pt. Has very low exhaled volumes.  Placed pt. Back on 28% ATC.  ELink notified. RN aware.

## 2013-05-29 NOTE — Progress Notes (Signed)
SLP Cancellation Note  Patient Details Name: [name] [name] MRN: [id number] DOB: [birth year]   Cancelled treatment:       Reason Eval/Treat Not Completed: Medical issues which prohibited therapy.  Noted respiratory difficulties last pm. Discussed with RN. Patient remains with copious secretions. Possible clot removal during suctioning by RT this am. SLP holding off on PMSV trials today. Will f/u 10/17.  PMSV can be helpful in assisting with secretion movement however copious secretions likely to impact function at this time.   [name] MA, CCC-SLP [phone]    [name] [name] 05/29/2013, 11:18 AM

## 2013-05-29 NOTE — Progress Notes (Signed)
Placed patient back on trach collar 30% 8lpm per MD.  Patient is tolerating well.  RN notified.

## 2013-05-29 NOTE — Progress Notes (Signed)
Inpatient Diabetes Program Recommendations  AACE/ADA: New Consensus Statement on Inpatient Glycemic Control (2013)  Target Ranges:  Prepandial:   less than 140 mg/dL      Peak postprandial:   less than 180 mg/dL (1-2 hours)      Critically ill patients:  140 - 180 mg/dL     Results for HARISH, BRAM (MRN [id number]) as of 05/29/2013 08:46  Ref. Range 05/28/2013 00:15 05/28/2013 07:35 05/28/2013 12:12 05/28/2013 17:05 05/28/2013 20:04  Glucose-Capillary Latest Range: 70-99 mg/dL 409 (H) 811 (H) 914 (H) 201 (H) 163 (H)    Results for VASILI, FOK (MRN [id number]) as of 05/29/2013 08:46  Ref. Range 05/29/2013 00:21 05/29/2013 05:47 05/29/2013 07:58  Glucose-Capillary Latest Range: 70-99 mg/dL 086 (H) 578 (H) 469 (H)    **Per H&P, patient with history of Type 2 DM.  CBGs >180 mg/dl.  **MD- Please start Novolog Moderate correction scale (SSI) Q4 hours per Glycemic Control order set   Will follow. Ambrose Finland RN, MSN, CDE Diabetes Coordinator Inpatient Diabetes Program Team Pager: [phone] (8a-10p)

## 2013-05-29 NOTE — Progress Notes (Signed)
[facility] PCCM  Called to bedside again, low tidal volumes.  Oxygenation normal.    Exam: Uncomfortable Diminished breath sounds R base   Impression: 1) Respiratory distress> oxygenating well, unclear cause: mucus plugging? Asynchrony?  Plan: -CXR -ABG -consider back to trach collar if ABG/CXR OK as he has been most comfortable this evening on ACT  Yolonda Kida PCCM Pager: [phone] Cell: [phone] If no response, call [phone]

## 2013-05-29 NOTE — Progress Notes (Signed)
Low exhaled volumes continue.  Pt. Appears uncomfortable.  Pt. Lavaged and suctioned X2.  Moderate amount of thin bloody secretions.  Pt. Produced a small clot via spontaneous cough.

## 2013-05-29 NOTE — Progress Notes (Signed)
Physical Therapy Treatment Patient Details Name: [name] [name] MRN: [id number] DOB: May 16, 1951 Today's Date: 05/29/2013 Time: 1022-1052 PT Time Calculation (min): 30 min  PT Assessment / Plan / Recommendation  History of Present Illness 62 yo with past medical history of chronic severe restrictive pulmonary secondary to obesity and pleural fibrosis (previously reported as COPD), OSA with poor BiPAP compliance at home, OHS and recent intubation for acute respiratory failure brought to ED with progressive dyspnea and acute encephalopathy.  In ED intubated for airway protection. Trach placed 10/10 and pt now on trach collar.   PT Comments   Pt admitted with above. Pt currently with functional limitations due to weakness, balance and endurance deficits limiting mobility.  Pt will benefit from skilled PT to increase their independence and safety with mobility to allow discharge to the venue listed below.   Follow Up Recommendations  Supervision/Assistance - 24 hour;Home health PT (Wife wants to take pt home and avoid SNF)                 Equipment Recommendations  Wheelchair (measurements PT);Wheelchair cushion (measurements PT);Rolling walker with 5" wheels;3in1 (PT);Hospital bed        Frequency Min 3X/week   Progress towards PT Goals Progress towards PT goals: Progressing toward goals  Plan Discharge plan needs to be updated    Precautions / Restrictions Precautions Precautions: Fall Restrictions Weight Bearing Restrictions: No   Pertinent Vitals/Pain VSS, No pain    Mobility  Bed Mobility Bed Mobility: Not assessed Transfers Transfers: Sit to Stand;Stand to Sit Sit to Stand: 1: +2 Total assist;With upper extremity assist;From chair/3-in-1;With armrests Sit to Stand: Patient Percentage: 60% Stand to Sit: 1: +2 Total assist;With upper extremity assist;To chair/3-in-1;With armrests Stand to Sit: Patient Percentage: 70% Stand Pivot Transfers: Not tested (comment) Details  for Transfer Assistance: Pt in low recliner therefore needed assist with pad to acheive sit to stand from low surface even with pillows.  Needed assist to control descent into chair as well.    ASked wife to assist pt and wife preferred to observe.   Ambulation/Gait Ambulation/Gait Assistance: 1: +2 Total assist Ambulation/Gait: Patient Percentage: 70% Ambulation Distance (Feet): 155 Feet Assistive device: Rolling walker Ambulation/Gait Assistance Details: Pt ambulated with RW with cues for sequencing steps and RW as well as cues for upright posture.  Had to assist with steering RW.   Continues with knee instability as he fatigues.   Gait Pattern: Step-to pattern;Decreased stride length;Decreased step length - left;Wide base of support;Trunk flexed Gait velocity: decreased Stairs: No Wheelchair Mobility Wheelchair Mobility: No    Exercises General Exercises - Lower Extremity Long Arc Quad: AROM;Both;10 reps;Seated Hip Flexion/Marching: AROM;Both;10 reps;Seated    PT Goals (current goals can now be found in the care plan section)    Visit Information  Last PT Received On: 05/29/13 Assistance Needed: +2 History of Present Illness: 62 yo with past medical history of chronic severe restrictive pulmonary secondary to obesity and pleural fibrosis (previously reported as COPD), OSA with poor BiPAP compliance at home, OHS and recent intubation for acute respiratory failure brought to ED with progressive dyspnea and acute encephalopathy.  In ED intubated for airway protection. Trach placed 10/10 and pt now on trach collar.    Subjective Data  Subjective: Trach in place.  Pt mouths, "I will try."   Cognition  Cognition Arousal/Alertness: Awake/alert Behavior During Therapy: WFL for tasks assessed/performed Overall Cognitive Status: Within Functional Limits for tasks assessed    Balance  Static Sitting  Balance Static Sitting - Level of Assistance: Not tested (comment) Static Standing  Balance Static Standing - Balance Support: Bilateral upper extremity supported;During functional activity Static Standing - Level of Assistance: 4: Min assist (using RW and therapist support)  End of Session PT - End of Session Equipment Utilized During Treatment: Gait belt;Oxygen (pt on 28% trach collar on arrival; ambulated pt on 35% trach) Activity Tolerance: Patient limited by fatigue Patient left: in chair;with call bell/phone within reach Nurse Communication: Mobility status;Need for lift equipment        INGOLD,Dezhane Staten 05/29/2013, 1:18 PM [facility] [phone] (pager)

## 2013-05-30 ENCOUNTER — Inpatient Hospital Stay (HOSPITAL_COMMUNITY): Payer: Medicare Other

## 2013-05-30 ENCOUNTER — Encounter (HOSPITAL_COMMUNITY): Payer: Self-pay | Admitting: *Deleted

## 2013-05-30 LAB — GLUCOSE, CAPILLARY
Glucose-Capillary: 161 mg/dL — ABNORMAL HIGH (ref 70–99)
Glucose-Capillary: 163 mg/dL — ABNORMAL HIGH (ref 70–99)
Glucose-Capillary: 164 mg/dL — ABNORMAL HIGH (ref 70–99)
Glucose-Capillary: 184 mg/dL — ABNORMAL HIGH (ref 70–99)

## 2013-05-30 LAB — COMPREHENSIVE METABOLIC PANEL
ALT: 8 U/L (ref 0–53)
AST: 16 U/L (ref 0–37)
Alkaline Phosphatase: 77 U/L (ref 39–117)
CO2: 38 mEq/L — ABNORMAL HIGH (ref 19–32)
Calcium: 9.7 mg/dL (ref 8.4–10.5)
Chloride: 97 mEq/L (ref 96–112)
GFR calc Af Amer: 43 mL/min — ABNORMAL LOW (ref >90)
GFR calc non Af Amer: 37 mL/min — ABNORMAL LOW (ref >90)
Glucose, Bld: 195 mg/dL — ABNORMAL HIGH (ref 70–99)
Potassium: 4.1 mEq/L (ref 3.5–5.1)
Sodium: 146 mEq/L — ABNORMAL HIGH (ref 135–145)

## 2013-05-30 MED ORDER — FENTANYL CITRATE 0.05 MG/ML IJ SOLN
INTRAMUSCULAR | Status: AC
  Administered 2013-05-30 (×2): 50 ug
  Filled 2013-05-30: qty 4

## 2013-05-30 MED ORDER — MIDAZOLAM HCL 2 MG/2ML IJ SOLN
INTRAMUSCULAR | Status: AC
  Administered 2013-05-30: 2 mg
  Filled 2013-05-30: qty 4

## 2013-05-30 MED ORDER — FREE WATER
350.0000 mL | Status: DC
  Administered 2013-05-30 – 2013-06-05 (×36): 350 mL

## 2013-05-30 MED ORDER — FUROSEMIDE 10 MG/ML IJ SOLN
INTRAMUSCULAR | Status: AC
  Filled 2013-05-30: qty 8

## 2013-05-30 MED ORDER — ETOMIDATE 2 MG/ML IV SOLN
INTRAVENOUS | Status: AC
  Administered 2013-05-30: 20 mg
  Filled 2013-05-30: qty 10

## 2013-05-30 MED ORDER — FUROSEMIDE 10 MG/ML IJ SOLN
60.0000 mg | Freq: Once | INTRAMUSCULAR | Status: AC
  Administered 2013-05-30: 60 mg via INTRAVENOUS

## 2013-05-30 NOTE — Progress Notes (Signed)
PULMONARY  / CRITICAL CARE MEDICINE  Name: [name] [name] MRN: [id number] DOB: Dec 21, 1950    ADMISSION DATE:  05/20/2013 CONSULTATION DATE:  05/20/2013  REFERRING MD :  EDP PRIMARY SERVICE:  PCCM  CHIEF COMPLAINT:  Dyspnea  BRIEF PATIENT DESCRIPTION: 62 y/o with past medical history of chronic severe restrictive pulmonary secondary to obesity and pleural fibrosis (previously reported as COPD), OSA with poor BiPAP compliance at home, OHS and recent intubation for acute respiratory failure brought to ED with progressive dyspnea and acute encephalopathy.  In ED intubated for airway protection.  PCCM was consulted.  SIGNIFICANT EVENTS / STUDIES:  10/07 - early AM 10/8 - intubated in the ED, admitted to ICU 10/08 - stable on vent, 2D echo >>> LA and LV mildly dilated, mild LVH, EF 55-60%, grade 1 diastolic dysfunction, septum with abnormal function/dyssynergy, mild AS, calcified mitral annulus 10/09 - urinary retention overnight, Foley replaced; ENT consult for tracheostomy eval (Dr. [name]) 10/10 - to OR for trach 10/13- did not tolerate PMV well 10/15- acidotic  At 5 am abg 10/16- Trach change to 6 Shiley cuffed 10/17 xlt cuffed distal>>>  LINES / TUBES: ETT 10/7 >> 10/10 Trach 10/10 (Bates)>>  Trach 10/15>> Downsized to 6 Shiley uncuffed  CULTURES: 10/7 Urine >>> 25k CFU/mL yeast Sputum 10/16>>>  ANTIBIOTICS: Levaquin 10/7 >>> 10/7  SUBJECTIVE:  Dyspneic, uncomfortable while on 30% TC. Increased secretions.  Then hypoxia severe  VITAL SIGNS: Temp:  [98.2 F (36.8 C)-98.9 F (37.2 C)] 98.6 F (37 C) (10/17 0808) Pulse Rate:  [78-115] 115 (10/17 1010) Resp:  [17-25] 17 (10/17 0808) BP: (108-129)/(61-89) 129/67 mmHg (10/17 0808) SpO2:  [90 %-100 %] 100 % (10/17 1010) FiO2 (%):  [28 %-98 %] 98 % (10/17 1207)  VENTILATOR SETTINGS: Vent Mode:  [-] PRVC FiO2 (%):  [28 %-98 %] 98 % Set Rate:  [14 bmp] 14 bmp Vt Set:  [450 mL] 450 mL PEEP:  [5 cmH20] 5 cmH20 Plateau  Pressure:  [13 cmH20] 13 cmH20  INTAKE / OUTPUT: Intake/Output     10/16 0701 - 10/17 0700 10/17 0701 - 10/18 0700   I.V. (mL/kg) 579.5 (4.7)    NG/GT 1352.2    Total Intake(mL/kg) 1931.7 (15.6)    Urine (mL/kg/hr) 2950 (1) 435 (0.7)   Total Output 2950 435   Net -1018.3 -435         PHYSICAL EXAMINATION: General:  Alert, uncomfortable, but calm.  Attempts communication.  Neuro:  A/O x 3 , moves all 4. HEENT:  Trach scant frank bleeding and clotts at base of trach Cardiovascular:  RRR, no 2-3/6 murmur heard best at 4ICS nipple line Lungs:  Scattered rhonchi  Abdomen:  Soft, nontender, bowel sounds present Musculoskeletal:  Moves all extremities, mild edema. Swollen, tender 3rd finger on left hand. Left and right elbow tender and warm to touch.    LABS:  CHEMISTRY  Recent Labs Lab 05/26/13 0628 05/27/13 0500 05/28/13 0527 05/29/13 0525 05/30/13 0655  NA 145 147* 148* 146* 146*  K 3.9 4.1 4.2 4.3 4.1  CL 98 100 101 99 97  CO2 40* 39* 40* 39* 38*  GLUCOSE 138* 160* 165* 213* 195*  BUN 84* 100* 109* 113* 102*  CREATININE 1.87* 2.16* 2.19* 2.10* 1.87*  CALCIUM 9.4 9.3 9.7 9.4 9.7   Estimated Creatinine Clearance: 57 ml/min (by C-G formula based on Cr of 1.87). PCXR: Unchanged lower opacities. Multiple small pleural effusions.   ASSESSMENT / PLAN:  PULMONARY A:   Acute on  chronic respiratory failure - s/p Trach 10/10  Chronic restrictive pulmonary disease - hx of chronic pleural thickening, obesity.  Unlikely AECOPD  given known restrictive pathology OSA/OHS - with poor BiPAP compliance at home. S/p trach  Will be trach dep indef.  Down sized to 6 (cuffless) 10/15 Changed to 6 cuffed 10/17 due to large air leak and low exhaled volumes Increased tracheal bleeding.  hypoxia  P:   - changed to 6 xlt distal , bronch performed and removed clots / blood - f/u sputum culture  - Trach collar as tolerated, sat goal >90% - Continue Albuterol / Atrovent - SLP  evaluation when ready -vent pee 5p 100%, Tv re reduction as leak gone  CARDIOVASCULAR A:  CAD s/p CABG.   Chronic diastolic CHF - without frank exacerbation  Transiently hypotensive after intubation - BP now improved Troponins negative TTE as above - known grade 1 diastolic dysfunction, ?new septal abnormality  P:  Hold Metoprolol, Imdur - consider restart as BP tolerates  RENAL A:   Acute on chronic renal failure  - Continued significant amount of insensible loss from trach secretions   P:   - Trend BMP - Continue free water per tube, follow na -  Hold diuretics , follow trend crt with this approach   HEMATOLOGIC A:   Anemia - chronic likely related to multiple chronic illness. Hb stable 8-9. Thrombocytopenia - uncertain cause, chronic and present on admission, no obvious bleed P:  - Trend CBC - SCDs - Ambulation per PT as tolerated  Avoid anticoagulants   ENDOCRINE  A:   DM2 - diet controlled P:   - CBGs q 8 hrs - SSI if glu > 180   NEURO/RHEUM A:   Acute encephalopathy - resolved Pain due to ETT - improving  Acute Gout Flare - swollen, painful left 3rd digit of left hand, R elbow P:   - PRN analgesia - Initiated prednisone for acute gout, hold other agents due to renal insufficiency - Mobilize per PT   Mcarthur Rossetti. Tyson Alias, MD, FACP Pgr: [phone] [facility]

## 2013-05-30 NOTE — Procedures (Signed)
Tracheostomy Change Note  Patient Details:   Name: GHALI MORISSETTE DOB: 1950-12-05 MRN: [id number]    Airway Documentation:     Airway examined & trach placement confirmed by bronchoscopy by Dr Tyson Alias. Shiley 6XL distal placed Evaluation  O2 sats: stable throughout Complications: No apparent complications Patient did tolerate procedure well. Bilateral Breath Sounds: Rhonchi Suctioning: Airway  Kendall Flack Royal 05/30/2013, 4:05 PM

## 2013-05-30 NOTE — Progress Notes (Signed)
SLP PMSV cancellation note  Pt's trach downsized to 6 (cuffless) 10/15; changed to 6 cuffed 10/17 due to large air leak and low exhaled volumes;  Increased tracheal bleeding.  Dyspneic, uncomfortable while on 30% TC,  Increased secretions per critical care.  Will hold SLP treatment today and PMSV trials and continue to follow for readiness.  Despina Boan L. Samson Frederic, [place] CCC/SLP Pager [phone]

## 2013-05-30 NOTE — Progress Notes (Signed)
Pt urine bloody red, new development from previous color of amber. Notified Dr.Deterding of pt status. No new orders received. Will continue to monitor pt.

## 2013-05-30 NOTE — Procedures (Signed)
Bronchoscopy Procedure Note ROOK MAUE [id number] [birth year]  Procedure: Bronchoscopy Indications: Diagnostic evaluation of the airways and Remove secretions  Procedure Details Consent: Risks of procedure as well as the alternatives and risks of each were explained to the (patient/caregiver).  Consent for procedure obtained. Time Out: Verified patient identification, verified procedure, site/side was marked, verified correct patient position, special equipment/implants available, medications/allergies/relevent history reviewed, required imaging and test results available.  Performed  In preparation for procedure, patient was given 100% FiO2 and bronchoscope lubricated. Sedation: Etomidate  Airway entered and the following bronchi were examined: RUL, RML, RLL, LUL, LLL and Bronchi.   Procedures performed: Brushings performed - no Bronchoscope removed.  , Patient placed back on 100% FiO2 at conclusion of procedure.    Evaluation Hemodynamic Status: BP stable throughout; O2 sats: stable throughout Patient's Current Condition: stable Specimens:  None Complications: No apparent complications Patient did tolerate procedure well.   Nelda Bucks. 05/30/2013   1. Rt Eccessory lobe take off 3 cm above carina, did not enter this 2. Severe old blood all lobes rt , suctioned, thick old clot removed, a lot of distal clots removed, improved O2 3. Left wnl 4. Lavaged saline all lobes rt  Mcarthur Rossetti. Tyson Alias, MD, FACP Pgr: [phone] [facility]

## 2013-05-30 NOTE — Progress Notes (Signed)
Patient's secretion is white and thin.  He is able to cough up the secretions.  Inner cannula was changed but not the trach ties.

## 2013-05-30 NOTE — Progress Notes (Signed)
Physical Therapy Treatment Patient Details Name: [name] [name] MRN: [id number] DOB: 16-May-1951 Today's Date: 05/30/2013 Time: 6213-0865 PT Time Calculation (min): 26 min  PT Assessment / Plan / Recommendation  History of Present Illness 62 yo with past medical history of chronic severe restrictive pulmonary secondary to obesity and pleural fibrosis (previously reported as COPD), OSA with poor BiPAP compliance at home, OHS and recent intubation for acute respiratory failure brought to ED with progressive dyspnea and acute encephalopathy.  In ED intubated for airway protection. Trach placed 10/10 and pt now on trach collar.   PT Comments   Pt continues to progress with mobility with increased ambulation today. Pt greatest difficulty at this time is transfers from low surfaces. Pt educated for HEP and encouraged to perform throughout the day. Pt with bleeding around trach with RN addressing and MD aware. Will continue to follow.   Follow Up Recommendations  Supervision/Assistance - 24 hour;Home health PT     Does the patient have the potential to tolerate intense rehabilitation     Barriers to Discharge        Equipment Recommendations       Recommendations for Other Services    Frequency     Progress towards PT Goals Progress towards PT goals: Progressing toward goals  Plan Current plan remains appropriate    Precautions / Restrictions Precautions Precautions: Fall Precaution Comments: trach, panda   Pertinent Vitals/Pain sats 88-94% on RA with ambulation 100% on 28% trach collar at rest HR 115 Pt with minimal abdominal and left hand pain grossly 3/10    Mobility  Bed Mobility Bed Mobility: Not assessed Details for Bed Mobility Assistance: pt denied bed mobility states he has been sleeping in the chair Transfers Sit to Stand: 1: +2 Total assist;With upper extremity assist;From chair/3-in-1;With armrests Sit to Stand: Patient Percentage: 60% Stand to Sit: 4: Min  assist;With armrests;To chair/3-in-1 Details for Transfer Assistance: cueing for hand placement with assist to stand from low surface Ambulation/Gait Ambulation/Gait Assistance: 4: Min assist Ambulation Distance (Feet): 180 Feet Assistive device: Rolling walker Ambulation/Gait Assistance Details: +1 to follow with chair but pt able to complete gait with minimal fatigue and did not require seated rest on RA. Cueing throughout for upright posture and to steer further away from obstacles with RW Gait Pattern: Step-to pattern;Decreased stride length;Decreased step length - left;Wide base of support;Trunk flexed Gait velocity: decreased Stairs: No    Exercises General Exercises - Lower Extremity Long Arc Quad: AROM;Both;Seated;20 reps Hip Flexion/Marching: AROM;Both;Seated;20 reps Toe Raises: AROM;Seated;Both;20 reps Heel Raises: AROM;Seated;Both;20 reps   PT Diagnosis:    PT Problem List:   PT Treatment Interventions:     PT Goals (current goals can now be found in the care plan section)    Visit Information  Last PT Received On: 05/30/13 Assistance Needed: +2 (safety) History of Present Illness: 62 yo with past medical history of chronic severe restrictive pulmonary secondary to obesity and pleural fibrosis (previously reported as COPD), OSA with poor BiPAP compliance at home, OHS and recent intubation for acute respiratory failure brought to ED with progressive dyspnea and acute encephalopathy.  In ED intubated for airway protection. Trach placed 10/10 and pt now on trach collar.    Subjective Data      Cognition  Cognition Arousal/Alertness: Awake/alert Behavior During Therapy: WFL for tasks assessed/performed Overall Cognitive Status: Within Functional Limits for tasks assessed    Balance     End of Session PT - End of Session Equipment Utilized  During Treatment: Gait belt Activity Tolerance: Patient tolerated treatment well Patient left: in chair;with call bell/phone  within reach Nurse Communication: Mobility status   GP     Toney Sang Beth 05/30/2013, 10:14 AM [name], PT [phone]

## 2013-05-30 NOTE — Progress Notes (Signed)
Paged CCM due to Pt hypoxemia. Per Dr Tyson Alias, left message w ENT office that trach needs changing. Pt returned to bed, currently on 98% ATC. Changed IC & suctioned, Sp02 improved. CXR done.

## 2013-05-30 NOTE — Procedures (Signed)
Procedure note  I placed bronch through trach, trach NOT in airway Able to change angle and see airway Removed bronch, placed bouche easily Removed trach Placed new extra long 6 without difficulty Bronch reveals wel placed trach cuff up  Mcarthur Rossetti. Tyson Alias, MD, FACP Pgr: [phone] [facility]

## 2013-05-30 NOTE — Progress Notes (Signed)
Trach collar and trach site has old blood around it.  As per report, patient has been bleeding on the stoma and had bloody secretions.  MD and RT are planning to changed the trach to a longer size.

## 2013-05-30 NOTE — Clinical Social Work Note (Signed)
CSW signing off on pt, as he will be going home with wife per [facility].   Maryclare Labrador, MSW, [facility] Clinical Social Worker [phone]

## 2013-05-31 DIAGNOSIS — M109 Gout, unspecified: Secondary | ICD-10-CM

## 2013-05-31 LAB — CBC
HCT: 29.8 % — ABNORMAL LOW (ref 39.0–52.0)
MCHC: 28.5 g/dL — ABNORMAL LOW (ref 30.0–36.0)
Platelets: 230 10*3/uL (ref 150–400)
RDW: 16 % — ABNORMAL HIGH (ref 11.5–15.5)
WBC: 9.7 10*3/uL (ref 4.0–10.5)

## 2013-05-31 LAB — COMPREHENSIVE METABOLIC PANEL
ALT: 10 U/L (ref 0–53)
Alkaline Phosphatase: 68 U/L (ref 39–117)
BUN: 111 mg/dL — ABNORMAL HIGH (ref 6–23)
CO2: 37 mEq/L — ABNORMAL HIGH (ref 19–32)
Chloride: 98 mEq/L (ref 96–112)
Creatinine, Ser: 2.08 mg/dL — ABNORMAL HIGH (ref 0.50–1.35)
GFR calc Af Amer: 38 mL/min — ABNORMAL LOW (ref >90)
GFR calc non Af Amer: 33 mL/min — ABNORMAL LOW (ref >90)
Glucose, Bld: 187 mg/dL — ABNORMAL HIGH (ref 70–99)
Potassium: 5.8 mEq/L — ABNORMAL HIGH (ref 3.5–5.1)
Sodium: 145 mEq/L (ref 135–145)
Total Bilirubin: 0.3 mg/dL (ref 0.3–1.2)
Total Protein: 7.3 g/dL (ref 6.0–8.3)

## 2013-05-31 LAB — PROCALCITONIN: Procalcitonin: 0.82 ng/mL

## 2013-05-31 LAB — GLUCOSE, CAPILLARY
Glucose-Capillary: 154 mg/dL — ABNORMAL HIGH (ref 70–99)
Glucose-Capillary: 171 mg/dL — ABNORMAL HIGH (ref 70–99)
Glucose-Capillary: 175 mg/dL — ABNORMAL HIGH (ref 70–99)
Glucose-Capillary: 177 mg/dL — ABNORMAL HIGH (ref 70–99)
Glucose-Capillary: 211 mg/dL — ABNORMAL HIGH (ref 70–99)

## 2013-05-31 MED ORDER — SODIUM CHLORIDE 0.9 % IV SOLN
INTRAVENOUS | Status: DC
  Administered 2013-05-31 – 2013-06-02 (×4): via INTRAVENOUS

## 2013-05-31 NOTE — Progress Notes (Signed)
PULMONARY  / CRITICAL CARE MEDICINE  Name: [name] [name] MRN: [id number] DOB: [birth year]    ADMISSION DATE:  05/20/2013 CONSULTATION DATE:  05/20/2013  REFERRING MD :  EDP PRIMARY SERVICE:  PCCM  CHIEF COMPLAINT:  Dyspnea  BRIEF PATIENT DESCRIPTION: 62 y/o with past medical history of chronic severe restrictive pulmonary secondary to obesity and pleural fibrosis (previously reported as COPD), OSA with poor BiPAP compliance at home, OHS and recent intubation for acute respiratory failure brought to ED with progressive dyspnea and acute encephalopathy.  In ED intubated for airway protection.  PCCM was consulted.  SIGNIFICANT EVENTS / STUDIES:  10/07 - early AM 10/8 - intubated in the ED, admitted to ICU 10/08 - stable on vent, 2D echo >>> LA and LV mildly dilated, mild LVH, EF 55-60%, grade 1 diastolic dysfunction, septum with abnormal function/dyssynergy, mild AS, calcified mitral annulus 10/09 - urinary retention overnight, Foley replaced; ENT consult for tracheostomy eval (Dr. [name]) 10/10 - to OR for trach 10/13- did not tolerate PMV well 10/15- acidotic  At 5 am abg 10/16- Trach change to 6 Shiley cuffed 10/17 xlt cuffed distal>>>  LINES / TUBES: ETT 10/7 >> 10/10 Trach 10/10 (Bates)>>  Trach 10/15>> Downsized to 6 Shiley uncuffed  CULTURES: 10/7 Urine >>> 25k CFU/mL yeast Sputum 10/16>>>  ANTIBIOTICS: Levaquin 10/7 >>> 10/7  SUBJECTIVE:  Not in pain, getting enough air but admits not really comfortable  VITAL SIGNS: Temp:  [97.9 F (36.6 C)-98.9 F (37.2 C)] 98.2 F (36.8 C) (10/18 0742) Pulse Rate:  [63-108] 84 (10/18 0800) Resp:  [15-28] 16 (10/18 0800) BP: (102-163)/(38-88) 119/65 mmHg (10/18 0800) SpO2:  [80 %-100 %] 98 % (10/18 0800) FiO2 (%):  [40 %-98 %] 40 % (10/18 0742)  VENTILATOR SETTINGS: Vent Mode:  [-] PRVC FiO2 (%):  [40 %-98 %] 40 % Set Rate:  [14 bmp] 14 bmp Vt Set:  [450 mL] 450 mL PEEP:  [5 cmH20] 5 cmH20 Plateau Pressure:  [22 cmH20-24  cmH20] 24 cmH20  INTAKE / OUTPUT: Intake/Output     10/17 0701 - 10/18 0700 10/18 0701 - 10/19 0700   I.V. (mL/kg) 690 (5.6)    NG/GT 1610    Total Intake(mL/kg) 2300 (18.6)    Urine (mL/kg/hr) 3110 (1)    Total Output 3110     Net -810           PHYSICAL EXAMINATION: General:  Alert, uncomfortable, but calm.  Nods and shakes head. Up in chair Neuro:  A/O x 3 , moves all 4. HEENT:  Trach 6  xlt cuffed, T-collar No stridor Cardiovascular:  RRR,  2-3/6 murmur heard best at 4ICS nipple line Lungs:  Shallow excursion, quiet Abdomen:  Soft, nontender, bowel sounds present Musculoskeletal:  Moves all extremities, mild edema. Swollen, tender 3rd finger on left hand. Left and right elbow tender and warm to touch.    LABS:  CHEMISTRY  Recent Labs Lab 05/27/13 0500 05/28/13 0527 05/29/13 0525 05/30/13 0655 05/31/13 0450  NA 147* 148* 146* 146* 145  K 4.1 4.2 4.3 4.1 5.8*  CL 100 101 99 97 98  CO2 39* 40* 39* 38* 37*  GLUCOSE 160* 165* 213* 195* 187*  BUN 100* 109* 113* 102* 111*  CREATININE 2.16* 2.19* 2.10* 1.87* 2.08*  CALCIUM 9.3 9.7 9.4 9.7 9.0   Estimated Creatinine Clearance: 51.3 ml/min (by C-G formula based on Cr of 2.08). PCXR:  10/17 Unchanged lower opacities. Multiple small pleural effusions.   ASSESSMENT / PLAN:  PULMONARY  A:   Acute on chronic respiratory failure - s/p Trach 10/10  Chronic restrictive pulmonary disease - hx of chronic pleural thickening, obesity.  Unlikely AECOPD  given known restrictive pathology OSA/OHS - with poor BiPAP compliance at home. S/p trach  Will be trach dep indef.  Down sized to 6 (cuffless) 10/15 Changed to 6 cuffed 10/17 due to large air leak and low exhaled volumes Increased tracheal bleeding.  Hypoxia- now 100% on 40% TBar  P:   -  10/17-changed to 6 xlt distal , bronch performed and removed clots / blood - f/u sputum culture  - Trach collar as tolerated, sat goal >90%  - Continue Albuterol / Atrovent - SLP  evaluation when ready -vent peep 5 100%, Tv 8cc/ Kg noct vent -CXR 10/19  CARDIOVASCULAR A:  CAD s/p CABG.   Chronic diastolic CHF - without frank exacerbation  Transiently hypotensive after intubation - BP now improved Troponins negative TTE as above - known grade 1 diastolic dysfunction, ?new septal abnormality  P:  Hold Metoprolol, Imdur - consider restart as BP tolerates  RENAL A:   Acute on chronic renal failure  - Continued significant amount of insensible loss from trach secretions  -  K 5.8 on Glucerna  P:   - Trend BMP - Continue free water per tube, follow na -  Hold diuretics , follow trend crt with this approach   HEMATOLOGIC A:   Anemia - chronic likely related to multiple chronic illness. Hb stable 8-9. Thrombocytopenia - uncertain cause, chronic and present on admission, no obvious bleed P:  - Trend CBC - SCDs - Ambulation per PT as tolerated  Avoid anticoagulants   ENDOCRINE  A:   DM2 - diet controlled P:   - CBGs q 8 hrs - SSI if glu > 180   NEURO/RHEUM A:   Acute encephalopathy - resolved Pain due to ETT - improving  Acute Gout Flare - swollen, painful left 3rd digit of left hand, R elbow P:   - PRN analgesia - Initiated prednisone for acute gout, hold other agents due to renal insufficiency - Mobilize per PT   CD Young, MDPCCM m- [phone] After hours- [phone] [facility]

## 2013-06-01 ENCOUNTER — Inpatient Hospital Stay (HOSPITAL_COMMUNITY): Payer: Medicare Other

## 2013-06-01 LAB — CBC WITH DIFFERENTIAL/PLATELET
Basophils Absolute: 0 10*3/uL (ref 0.0–0.1)
Basophils Relative: 0 % (ref 0–1)
Eosinophils Absolute: 0.2 10*3/uL (ref 0.0–0.7)
Hemoglobin: 7.9 g/dL — ABNORMAL LOW (ref 13.0–17.0)
MCHC: 28.3 g/dL — ABNORMAL LOW (ref 30.0–36.0)
Monocytes Relative: 12 % (ref 3–12)
Neutro Abs: 6.8 10*3/uL (ref 1.7–7.7)
Neutrophils Relative %: 76 % (ref 43–77)
Platelets: 246 10*3/uL (ref 150–400)
RDW: 15.9 % — ABNORMAL HIGH (ref 11.5–15.5)

## 2013-06-01 LAB — CULTURE, RESPIRATORY W GRAM STAIN: Gram Stain: NONE SEEN

## 2013-06-01 LAB — GLUCOSE, CAPILLARY
Glucose-Capillary: 151 mg/dL — ABNORMAL HIGH (ref 70–99)
Glucose-Capillary: 160 mg/dL — ABNORMAL HIGH (ref 70–99)
Glucose-Capillary: 162 mg/dL — ABNORMAL HIGH (ref 70–99)
Glucose-Capillary: 165 mg/dL — ABNORMAL HIGH (ref 70–99)
Glucose-Capillary: 167 mg/dL — ABNORMAL HIGH (ref 70–99)
Glucose-Capillary: 177 mg/dL — ABNORMAL HIGH (ref 70–99)

## 2013-06-01 LAB — BASIC METABOLIC PANEL
Calcium: 8.9 mg/dL (ref 8.4–10.5)
Chloride: 98 mEq/L (ref 96–112)
GFR calc Af Amer: 42 mL/min — ABNORMAL LOW (ref >90)
Potassium: 4.7 mEq/L (ref 3.5–5.1)

## 2013-06-01 NOTE — Progress Notes (Signed)
PULMONARY  / CRITICAL CARE MEDICINE  Name: [name] [name] MRN: [id number] DOB: 23-Aug-1950    ADMISSION DATE:  05/20/2013 CONSULTATION DATE:  05/20/2013  REFERRING MD :  EDP PRIMARY SERVICE:  PCCM  CHIEF COMPLAINT:  Dyspnea  BRIEF PATIENT DESCRIPTION: 62 y/o with past medical history of chronic severe restrictive pulmonary secondary to obesity and pleural fibrosis (previously reported as COPD), OSA with poor BiPAP compliance at home, OHS and recent intubation for acute respiratory failure brought to ED with progressive dyspnea and acute encephalopathy.  In ED intubated for airway protection.  PCCM was consulted.  SIGNIFICANT EVENTS / STUDIES:  10/07 - early AM 10/8 - intubated in the ED, admitted to ICU 10/08 - stable on vent, 2D echo >>> LA and LV mildly dilated, mild LVH, EF 55-60%, grade 1 diastolic dysfunction, septum with abnormal function/dyssynergy, mild AS, calcified mitral annulus 10/09 - urinary retention overnight, Foley replaced; ENT consult for tracheostomy eval (Dr. [name]) 10/10 - to OR for trach 10/13- did not tolerate PMV well 10/15- acidotic  At 5 am abg 10/16- Trach change to 6 Shiley cuffed 10/17 xlt cuffed distal>>>  LINES / TUBES: ETT 10/7 >> 10/10 Trach 10/10 (Bates)>>  Trach 10/15>> Downsized to 6 Shiley uncuffed  CULTURES: 10/7 Urine >>> 25k CFU/mL yeast Sputum 10/16>>>  ANTIBIOTICS: Levaquin 10/7 >>> 10/7  SUBJECTIVE: No reported complaints  VITAL SIGNS: Temp:  [98 F (36.7 C)-98.5 F (36.9 C)] 98 F (36.7 C) (10/19 0700) Pulse Rate:  [77-94] 91 (10/19 0700) Resp:  [17-29] 28 (10/19 0700) BP: (92-132)/(56-111) 92/62 mmHg (10/19 0700) SpO2:  [95 %-100 %] 98 % (10/19 0910) FiO2 (%):  [35 %-40 %] 35 % (10/19 0910)  VENTILATOR SETTINGS: Vent Mode:  [-] PRVC FiO2 (%):  [35 %-40 %] 35 % Set Rate:  [14 bmp] 14 bmp Vt Set:  [450 mL] 450 mL PEEP:  [5 cmH20] 5 cmH20 Plateau Pressure:  [19 cmH20-26 cmH20] 19 cmH20  INTAKE / OUTPUT: Intake/Output      10/18 0701 - 10/19 0700 10/19 0701 - 10/20 0700   I.V. (mL/kg) 360 (2.9)    NG/GT 1890    Total Intake(mL/kg) 2250 (18.2)    Urine (mL/kg/hr) 2450 (0.8)    Total Output 2450     Net -200           PHYSICAL EXAMINATION: General:  Asleep. Up in chair, elected not to disturb Neuro:  A/O x 3 , moves all 4. HEENT:  Trach 6  xlt cuffed, T-collar No stridor Cardiovascular:  RRR,  2-3/6 murmur heard best at 4ICS nipple line Lungs:  Shallow excursion, unlabored Abdomen:  bowel sounds present Musculoskeletal:  Has been moving all ext.   LABS:  CHEMISTRY  Recent Labs Lab 05/28/13 0527 05/29/13 0525 05/30/13 0655 05/31/13 0450 06/01/13 0415  NA 148* 146* 146* 145 142  K 4.2 4.3 4.1 5.8* 4.7  CL 101 99 97 98 98  CO2 40* 39* 38* 37* 34*  GLUCOSE 165* 213* 195* 187* 165*  BUN 109* 113* 102* 111* 106*  CREATININE 2.19* 2.10* 1.87* 2.08* 1.91*  CALCIUM 9.7 9.4 9.7 9.0 8.9   Estimated Creatinine Clearance: 55.8 ml/min (by C-G formula based on Cr of 1.91). PCXR:  10/17 Unchanged lower opacities. Multiple small pleural effusions.   CXR 10/19, persistent basilar atx, improving edema  ASSESSMENT / PLAN:  PULMONARY A:   Acute on chronic respiratory failure - s/p Trach 10/10  Chronic restrictive pulmonary disease - hx of chronic pleural thickening, obesity.  Unlikely AECOPD  given known restrictive pathology OSA/OHS - with poor BiPAP compliance at home. S/p trach  Will be trach dep indef.  Down sized to 6 (cuffless) 10/15 Changed to 6 cuffed 10/17 due to large air leak and low exhaled volumes Decreased tracheal bleeding.  Hypoxia- now 98% on 35% TBar  P:   -  10/17-changed to 6 xlt distal , bronch performed and removed clots / blood - f/u sputum culture  - Continue Trach collar as tolerated, sat goal >90%  - Continue Albuterol / Atrovent - SLP evaluation when ready -vent peep 5 100%, Tv 8cc/ Kg noct vent -CXR 10/19  CARDIOVASCULAR A:  CAD s/p CABG.   Chronic  diastolic CHF - without frank exacerbation  Transiently hypotensive after intubation - BP now improved Troponins negative TTE as above - known grade 1 diastolic dysfunction, ?new septal abnormality  P:  Hold Metoprolol, Imdur - consider restart as BP tolerates  RENAL A:   Acute on chronic renal failure  - Continued significant amount of insensible loss from trach secretions, sweat  P:   - Trend BMP- K now 4.7 - Continue free water per tube, follow na -  Hold diuretics , follow trend crt with this approach   HEMATOLOGIC A:   Anemia - chronic likely related to multiple chronic illness. Hb stable 8-9. Thrombocytopenia - uncertain cause, chronic and present on admission, no obvious bleed P:  - Trend CBC - SCDs - Ambulation per PT as tolerated  Avoid anticoagulants   ENDOCRINE  A:   DM2 - diet controlled P:   - CBGs q 8 hrs - SSI if glu > 180   NEURO/RHEUM A:   Acute encephalopathy - resolved Pain due to ETT - improving  Acute Gout Flare - swollen, painful left 3rd digit of left hand, R elbow P:   - PRN analgesia - Initiated prednisone for acute gout, hold other agents due to renal insufficiency - Mobilize per PT   CD Baraka Klatt, MDPCCM m- [phone] After hours- [phone] [facility]

## 2013-06-02 DIAGNOSIS — Z930 Tracheostomy status: Secondary | ICD-10-CM

## 2013-06-02 LAB — GLUCOSE, CAPILLARY
Glucose-Capillary: 142 mg/dL — ABNORMAL HIGH (ref 70–99)
Glucose-Capillary: 153 mg/dL — ABNORMAL HIGH (ref 70–99)
Glucose-Capillary: 141 mg/dL — ABNORMAL HIGH (ref 70–99)
Glucose-Capillary: 139 mg/dL — ABNORMAL HIGH (ref 70–99)

## 2013-06-02 NOTE — Progress Notes (Signed)
SLP Cancellation Note  Patient Details Name: [name] [name] MRN: [id number] DOB: [birth year]   Cancelled treatment:       Reason Eval/Treat Not Completed: Medical issues which prohibited therapy.  Spoke with nursing who reports continued bloody secretions requiring suctioning every hour. Rn advised to hold off on PMSV trials today but agreeable to re-attempt tomorrow. Pending healing of trach site and degree of secretions, at some point, may wish to consider proceeding with swallow evaluation whether or not patient is tolerating PMSV. Will f/u.   [name] MA, CCC-SLP [phone]    [name] 06/02/2013, 4:17 PM

## 2013-06-02 NOTE — Progress Notes (Signed)
Ventilator currently on standby. Patient stated he was not ready to be placed back on. Will place patient back on mechanical ventilation when he becomes tired. RT will continue to monitor.

## 2013-06-02 NOTE — Progress Notes (Signed)
Pt OOB in chair throughout shift. walks with PT in PM. Pt requires suctioning every hour for bloody secretions with clots in evidence.

## 2013-06-02 NOTE — Progress Notes (Signed)
PULMONARY  / CRITICAL CARE MEDICINE  Name: RONTRELL MOQUIN MRN: [id number] DOB: 1950/11/10    ADMISSION DATE:  05/20/2013 CONSULTATION DATE:  05/20/2013  REFERRING MD :  EDP PRIMARY SERVICE:  PCCM  CHIEF COMPLAINT:  Dyspnea  BRIEF PATIENT DESCRIPTION: 62 y/o with past medical history of chronic severe restrictive pulmonary secondary to obesity and pleural fibrosis (previously reported as COPD), OSA with poor BiPAP compliance at home, OHS and recent intubation for acute respiratory failure brought to ED with progressive dyspnea and acute encephalopathy.  In ED intubated for airway protection.  PCCM was consulted.  SIGNIFICANT EVENTS / STUDIES:  10/07 - early AM 10/8 - intubated in the ED, admitted to ICU 10/08 - stable on vent, 2D echo >>> LA and LV mildly dilated, mild LVH, EF 55-60%, grade 1 diastolic dysfunction, septum with abnormal function/dyssynergy, mild AS, calcified mitral annulus 10/09 - urinary retention overnight, Foley replaced; ENT consult for tracheostomy eval (Dr. [name]) 10/10 - to OR for trach 10/13- did not tolerate PMV well 10/15- acidotic  At 5 am abg 10/16- Trach change to 6 Shiley cuffed 10/17 xlt cuffed distal>>>  LINES / TUBES: ETT 10/7 >> 10/10 Trach 10/10 (Bates)>>  Trach 10/15>> Downsized to 6 Shiley uncuffed  CULTURES: 10/7 Urine >>> 25k CFU/mL yeast Sputum 10/16>>>  ANTIBIOTICS: Levaquin 10/7 >>> 10/7  SUBJECTIVE: No reported complaints  VITAL SIGNS: Temp:  [98.1 F (36.7 C)-99.3 F (37.4 C)] 98.1 F (36.7 C) (10/20 0831) Pulse Rate:  [42-94] 85 (10/20 0831) Resp:  [15-84] 84 (10/20 0831) BP: (83-121)/(54-66) 109/54 mmHg (10/20 0831) SpO2:  [90 %-100 %] 94 % (10/20 0831) FiO2 (%):  [35 %-40 %] 35 % (10/20 0823)  VENTILATOR SETTINGS: Vent Mode:  [-] PRVC FiO2 (%):  [35 %-40 %] 35 % Set Rate:  [14 bmp] 14 bmp Vt Set:  [450 mL] 450 mL PEEP:  [5 cmH20] 5 cmH20 Plateau Pressure:  [22 cmH20-24 cmH20] 22 cmH20  INTAKE /  OUTPUT: Intake/Output     10/19 0701 - 10/20 0700 10/20 0701 - 10/21 0700   I.V. (mL/kg) 660 (5.3) 90 (0.7)   NG/GT 2660 620   Total Intake(mL/kg) 3320 (26.8) 710 (5.7)   Urine (mL/kg/hr) 2225 (0.7)    Total Output 2225     Net +1095 +710         PHYSICAL EXAMINATION: General:  Up in chair, elected not to disturb Neuro:  A/O x 3 , moves all 4. HEENT:  Trach 6  xlt cuffed, T-collar No stridor Cardiovascular:  RRR,  2-3/6 murmur heard best at 4ICS nipple line Lungs:  Shallow excursion, unlabored Abdomen:  bowel sounds present Musculoskeletal:  Has been moving all ext.  LABS:  CHEMISTRY  Recent Labs Lab 05/28/13 0527 05/29/13 0525 05/30/13 0655 05/31/13 0450 06/01/13 0415  NA 148* 146* 146* 145 142  K 4.2 4.3 4.1 5.8* 4.7  CL 101 99 97 98 98  CO2 40* 39* 38* 37* 34*  GLUCOSE 165* 213* 195* 187* 165*  BUN 109* 113* 102* 111* 106*  CREATININE 2.19* 2.10* 1.87* 2.08* 1.91*  CALCIUM 9.7 9.4 9.7 9.0 8.9   Estimated Creatinine Clearance: 55.8 ml/min (by C-G formula based on Cr of 1.91). PCXR:  10/17 Unchanged lower opacities. Multiple small pleural effusions.   CXR 10/19, persistent basilar atx, improving edema  ASSESSMENT / PLAN:  PULMONARY A:   Acute on chronic respiratory failure - s/p Trach 10/10  Chronic restrictive pulmonary disease - hx of chronic pleural thickening, obesity.  Unlikely AECOPD  given known restrictive pathology OSA/OHS - with poor BiPAP compliance at home. S/p trach  Will be trach dep indef.  Down sized to 6 (cuffless) 10/15 Changed to 6 cuffed 10/17 due to large air leak and low exhaled volumes Decreased tracheal bleeding.  Hypoxia- now 98% on 35% TBar  P:   -  10/17-changed to 6 xlt distal , bronch performed and removed clots / blood - Continue Trach collar as tolerated, sat goal >90%  - Continue Albuterol / Atrovent - SLP evaluation when ready - Goal is vent at night and TC during the day.  CARDIOVASCULAR A:  CAD s/p CABG.    Chronic diastolic CHF - without frank exacerbation  Transiently hypotensive after intubation - BP now improved Troponins negative TTE as above - known grade 1 diastolic dysfunction, ?new septal abnormality  P:  - Hold Metoprolol, Imdur - consider restart as BP tolerates.  Remains soft for now.  RENAL A:   Acute on chronic renal failure  - Continued significant amount of insensible loss from trach secretions, sweat  P:   - Trend BMP- K now 4.7 - Continue free water per tube, follow na -  Hold diuretics for today and check BMET in AM then will determine need for diuretics.  HEMATOLOGIC A:   Anemia - chronic likely related to multiple chronic illness. Hb stable 8-9. Thrombocytopenia - uncertain cause, chronic and present on admission, no obvious bleed P:  - Trend CBC. - SCDs. - Ambulation per PT as tolerated. - Avoid anticoagulants.  ENDOCRINE  A:   DM2 - diet controlled P:   - CBGs q 8 hrs - SSI if glu > 180   NEURO/RHEUM A:   Acute encephalopathy - resolved Pain due to ETT - improving  Acute Gout Flare - swollen, painful left 3rd digit of left hand, R elbow P:   - PRN analgesia - Initiated prednisone for acute gout, hold other agents due to renal insufficiency - Mobilize per PT  Alyson Reedy, M.D. [facility] Pulmonary/Critical Care Medicine. Pager: [phone]. After hours pager: [phone].

## 2013-06-02 NOTE — Progress Notes (Signed)
Physical Therapy Treatment Patient Details Name: [name] [name] MRN: [id number] DOB: Apr 28, 1951 Today's Date: 06/02/2013 Time: 4098-1191 PT Time Calculation (min): 42 min  PT Assessment / Plan / Recommendation  History of Present Illness 62 yo with past medical history of chronic severe restrictive pulmonary secondary to obesity and pleural fibrosis (previously reported as COPD), OSA with poor BiPAP compliance at home, OHS and recent intubation for acute respiratory failure brought to ED with progressive dyspnea and acute encephalopathy.  In ED intubated for airway protection. Trach placed 10/10 and pt now on trach collar.   PT Comments   Pt continues to progress with mobility and activity tolerance. Pt very pleasant and encouraged to mobilize with nursing and continue HEP. Pt sats remained 93-97% on trach collar 30% with gait today with HR 94-114. Pt reports mild pain in hand still and numbness in hands and feet that is "off and on". Will continue to follow.   Follow Up Recommendations  Supervision/Assistance - 24 hour;Home health PT     Does the patient have the potential to tolerate intense rehabilitation     Barriers to Discharge        Equipment Recommendations       Recommendations for Other Services    Frequency     Progress towards PT Goals Progress towards PT goals: Progressing toward goals  Plan Current plan remains appropriate    Precautions / Restrictions Precautions Precautions: Fall Precaution Comments: trach, panda   Pertinent Vitals/Pain     Mobility  Bed Mobility Bed Mobility: Not assessed Details for Bed Mobility Assistance: pt denied bed mobility states he has been sleeping in the chair Transfers Sit to Stand: 4: Min assist;From chair/3-in-1;With armrests Stand to Sit: 4: Min guard;To chair/3-in-1;With armrests Details for Transfer Assistance: cueing for hand placement with assist to stand from low surface Ambulation/Gait Ambulation/Gait Assistance:  4: Min guard Ambulation Distance (Feet): 260 Feet Assistive device: Rolling walker Ambulation/Gait Assistance Details: cueing for posture and to step into RW, pt able to increase distance today with decresed fatigue with use of trach collar. Pt with increased static standing after gait grossly 4 min for weight, gown and linen change Gait Pattern: Step-through pattern;Decreased stride length;Trunk flexed;Wide base of support Gait velocity: decreased Stairs: No    Exercises General Exercises - Lower Extremity Long Arc Quad: AROM;Both;Seated;20 reps Hip Flexion/Marching: AROM;Both;Seated;20 reps Toe Raises: AROM;Seated;Both;20 reps Heel Raises: AROM;Seated;Both;20 reps   PT Diagnosis:    PT Problem List:   PT Treatment Interventions:     PT Goals (current goals can now be found in the care plan section)    Visit Information  Last PT Received On: 06/02/13 Assistance Needed: +1 History of Present Illness: 62 yo with past medical history of chronic severe restrictive pulmonary secondary to obesity and pleural fibrosis (previously reported as COPD), OSA with poor BiPAP compliance at home, OHS and recent intubation for acute respiratory failure brought to ED with progressive dyspnea and acute encephalopathy.  In ED intubated for airway protection. Trach placed 10/10 and pt now on trach collar.    Subjective Data      Cognition  Cognition Arousal/Alertness: Awake/alert Behavior During Therapy: WFL for tasks assessed/performed Overall Cognitive Status: Within Functional Limits for tasks assessed    Balance     End of Session PT - End of Session Activity Tolerance: Patient tolerated treatment well Patient left: in chair;with call bell/phone within reach Nurse Communication: Mobility status   GP     Toney Sang [facility] 06/02/2013,  2:46 PM [name], PT [phone]

## 2013-06-03 ENCOUNTER — Other Ambulatory Visit (HOSPITAL_COMMUNITY): Payer: Self-pay | Admitting: *Deleted

## 2013-06-03 ENCOUNTER — Inpatient Hospital Stay (HOSPITAL_COMMUNITY): Payer: Medicare Other

## 2013-06-03 LAB — GLUCOSE, CAPILLARY
Glucose-Capillary: 165 mg/dL — ABNORMAL HIGH (ref 70–99)
Glucose-Capillary: 124 mg/dL — ABNORMAL HIGH (ref 70–99)
Glucose-Capillary: 142 mg/dL — ABNORMAL HIGH (ref 70–99)

## 2013-06-03 LAB — BASIC METABOLIC PANEL
BUN: 81 mg/dL — ABNORMAL HIGH (ref 6–23)
CO2: 36 mEq/L — ABNORMAL HIGH (ref 19–32)
Calcium: 9.2 mg/dL (ref 8.4–10.5)
Chloride: 108 mEq/L (ref 96–112)
Glucose, Bld: 185 mg/dL — ABNORMAL HIGH (ref 70–99)
Potassium: 5.4 mEq/L — ABNORMAL HIGH (ref 3.5–5.1)
Sodium: 151 mEq/L — ABNORMAL HIGH (ref 135–145)

## 2013-06-03 LAB — CBC
HCT: 30.2 % — ABNORMAL LOW (ref 39.0–52.0)
Hemoglobin: 8 g/dL — ABNORMAL LOW (ref 13.0–17.0)
RBC: 2.98 MIL/uL — ABNORMAL LOW (ref 4.22–5.81)
WBC: 9.3 10*3/uL (ref 4.0–10.5)

## 2013-06-03 LAB — MAGNESIUM: Magnesium: 3.2 mg/dL — ABNORMAL HIGH (ref 1.5–2.5)

## 2013-06-03 LAB — PHOSPHORUS: Phosphorus: 4 mg/dL (ref 2.3–4.6)

## 2013-06-03 MED ORDER — SODIUM CHLORIDE 0.9 % IN NEBU
INHALATION_SOLUTION | RESPIRATORY_TRACT | Status: AC
  Filled 2013-06-03: qty 3

## 2013-06-03 MED ORDER — FLUCONAZOLE 40 MG/ML PO SUSR
100.0000 mg | Freq: Every day | ORAL | Status: DC
  Administered 2013-06-04 – 2013-06-10 (×7): 100 mg via ORAL
  Filled 2013-06-03 (×7): qty 2.5

## 2013-06-03 MED ORDER — SODIUM POLYSTYRENE SULFONATE 15 GM/60ML PO SUSP
30.0000 g | Freq: Once | ORAL | Status: AC
  Administered 2013-06-03: 30 g
  Filled 2013-06-03: qty 120

## 2013-06-03 MED ORDER — GLUCERNA 1.2 CAL PO LIQD
1000.0000 mL | ORAL | Status: DC
  Administered 2013-06-04 – 2013-06-08 (×5): 1000 mL
  Filled 2013-06-03 (×12): qty 1000

## 2013-06-03 MED ORDER — PRO-STAT SUGAR FREE PO LIQD
30.0000 mL | Freq: Three times a day (TID) | ORAL | Status: DC
  Administered 2013-06-03 – 2013-06-10 (×18): 30 mL
  Filled 2013-06-03 (×23): qty 30

## 2013-06-03 MED ORDER — FLUCONAZOLE 40 MG/ML PO SUSR
200.0000 mg | Freq: Once | ORAL | Status: AC
  Administered 2013-06-03: 200 mg via ORAL
  Filled 2013-06-03 (×2): qty 5

## 2013-06-03 NOTE — Progress Notes (Signed)
Arrived to pt's room to start trach education with pt and wife. No family at bedside at this time. Pt having increased amounts of frank red blood, very thick secretions requiring frequent suctioning. Concerned of ability of family and pt to care for this trach at home due to amount of secretions and increased blood. Questionable discharge home on Thursday. Will follow up on 06/04/13.

## 2013-06-03 NOTE — Progress Notes (Addendum)
Received referral from long length of stay meeting. Met with [name] [name] and wife at bedside to explain and discuss [facility] Care Management services. However, after meeting with patient and family, it appears patient's PCP is Dr Rhona Leavens who is not listed at this time as a [facility] PCP. Wife states Dr Rhona Leavens will be a new PCP for patient. [facility] Care Management will be unable to follow patient at current time due to PCP. Left [facility] Care Management information with wife anyway in case patient will become eligible in the future. Raiford Noble, MSN- Ed, RN,BSN- [facility] [phone]

## 2013-06-03 NOTE — Progress Notes (Signed)
PULMONARY  / CRITICAL CARE MEDICINE  Name: [name] [name] MRN: [id number] DOB: [birth year]    ADMISSION DATE:  05/20/2013 CONSULTATION DATE:  05/20/2013  REFERRING MD :  EDP PRIMARY SERVICE:  PCCM  CHIEF COMPLAINT:  Dyspnea  BRIEF PATIENT DESCRIPTION: 62 y/o with past medical history of chronic severe restrictive pulmonary secondary to obesity and pleural fibrosis (previously reported as COPD), OSA with poor BiPAP compliance at home, OHS and recent intubation for acute respiratory failure brought to ED with progressive dyspnea and acute encephalopathy.  In ED intubated for airway protection.  PCCM was consulted.  SIGNIFICANT EVENTS / STUDIES:  10/07 - early AM 10/8 - intubated in the ED, admitted to ICU 10/08 - stable on vent, 2D echo >>> LA and LV mildly dilated, mild LVH, EF 55-60%, grade 1 diastolic dysfunction, septum with abnormal function/dyssynergy, mild AS, calcified mitral annulus 10/09 - urinary retention overnight, Foley replaced; ENT consult for tracheostomy eval (Dr. [name]) 10/10 - to OR for trach 10/13- did not tolerate PMV well 10/15- acidotic  At 5 am abg 10/16- Trach change to 6 Shiley cuffed 10/17 xlt cuffed distal>>>  LINES / TUBES: ETT 10/7 >> 10/10 Trach 10/10 (Bates)>>  Trach 10/15>> Downsized to 6 Shiley uncuffed  CULTURES: 10/7 Urine >>> 25k CFU/mL yeast Sputum 10/16>>>  ANTIBIOTICS: Levaquin 10/7 >>> 10/7  SUBJECTIVE: Complains of sore mouth, and wants to eat.  VITAL SIGNS: Temp:  [98 F (36.7 C)-98.7 F (37.1 C)] 98.2 F (36.8 C) (10/21 0740) Pulse Rate:  [80-114] 80 (10/21 0748) Resp:  [17-30] 22 (10/21 0748) BP: (101-129)/(57-73) 112/64 mmHg (10/21 0748) SpO2:  [93 %-100 %] 93 % (10/21 0748) FiO2 (%):  [30 %-40 %] 40 % (10/21 0748) Weight:  [267 lb (121.11 kg)] 267 lb (121.11 kg) (10/20 1400)  VENTILATOR SETTINGS: Vent Mode:  [-] PRVC FiO2 (%):  [30 %-40 %] 40 % Set Rate:  [14 bmp] 14 bmp Vt Set:  [450 mL] 450 mL PEEP:  [5 cmH20] 5  cmH20 Plateau Pressure:  [22 cmH20-29 cmH20] 22 cmH20  INTAKE / OUTPUT: Intake/Output     10/20 0701 - 10/21 0700 10/21 0701 - 10/22 0700   I.V. (mL/kg) 750 (6.2) 60 (0.5)   NG/GT 2920 490   Total Intake(mL/kg) 3670 (30.3) 550 (4.5)   Urine (mL/kg/hr) 2900 (1) 400 (1.2)   Total Output 2900 400   Net +770 +150         PHYSICAL EXAMINATION: General:  Up in chair, alert and appropriate. Neuro:  A/O x 3 , moves all 4. HEENT:  Trach 6  xlt cuffed, T-collar No stridor, thrush noted. Cardiovascular:  RRR,  2-3/6 murmur heard best at 4ICS nipple line Lungs:  Shallow excursion, unlabored, BR bloody secretions and clots with frequent suctioning required. Abdomen:  bowel sounds present, tolerating tube feeds. Musculoskeletal:  Has been moving all ext.and ambulating with PT.  LABS:  CHEMISTRY  Recent Labs Lab 05/29/13 0525 05/30/13 0655 05/31/13 0450 06/01/13 0415 06/03/13 0415  NA 146* 146* 145 142 151*  K 4.3 4.1 5.8* 4.7 5.4*  CL 99 97 98 98 108  CO2 39* 38* 37* 34* 36*  GLUCOSE 213* 195* 187* 165* 185*  BUN 113* 102* 111* 106* 81*  CREATININE 2.10* 1.87* 2.08* 1.91* 1.72*  CALCIUM 9.4 9.7 9.0 8.9 9.2  MG  --   --   --   --  3.2*  PHOS  --   --   --   --  4.0  Estimated Creatinine Clearance: 61.3 ml/min (by C-G formula based on Cr of 1.72). PCXR:  10/17 Unchanged lower opacities. Multiple small pleural effusions.   CXR 10/19, persistent basilar atx, improving edema  ASSESSMENT / PLAN:  PULMONARY A:   Acute on chronic respiratory failure - s/p Trach 10/10  Chronic restrictive pulmonary disease - hx of chronic pleural thickening, obesity.  Unlikely AECOPD  given known restrictive pathology OSA/OHS - with poor BiPAP compliance at home. S/p trach  Will be trach dep indef.  Down sized to 6 (cuffless) 10/15 Changed to 6 cuffed 10/17 due to large air leak and low exhaled volumes  Hypoxia- now 96% on 35% TBar 10/21: Bright red bloody secretions with clots, requiring  frequent suctioning.  P:   - 10/17-changed to 6 xlt distal , bronch performed and removed clots / blood - Continue Trach collar as tolerated, sat goal >90%  - Continue Albuterol / Atrovent - SLP evaluation in process - Goal is vent at night and TC during the day.( Tolerating well 10/21) - Suspect airway trauma, allow pt. to cough up secretions as able, less invasive     Suctioning to allow for airway healing if sats allow.  CARDIOVASCULAR A:  CAD s/p CABG.   Chronic diastolic CHF - without frank exacerbation  Transiently hypotensive after intubation - BP now improved Troponins negative TTE as above - known grade 1 diastolic dysfunction, ?new septal abnormality  P:  - Hold Metoprolol, Imdur - consider restart as BP tolerates.  Remains soft 10/21.  RENAL A:   Acute on chronic renal failure  Hypernatremia Hyperkalemia - Continued significant amount of insensible loss from trach secretions, sweat  P:   - Trend BMP- K now 5.4( 10/21) - Continue free water per tube, follow Na ( Free water deficit 5.3L 10/21) - Hold diuretics for today( 10/21) and trend BMP. - Kayexalate today as ordered. - BMET in AM.  HEMATOLOGIC A:   Anemia - chronic likely related to multiple chronic illness. Hb stable 8-9. Thrombocytopenia - uncertain cause, chronic and present on admission, no obvious bleed Bloody secretions per trach P:  - Trend CBC. - SCDs. - Ambulation per PT as tolerated. - Avoid anticoagulants. - Avoid un-necessary suctioning  ENDOCRINE  A:   DM2 - diet controlled( Tube Feeds) P:   - CBGs q 8 hrs - SSI if glu > 180   Infectious: A:  Thrush P:  Diflucan po  NEURO/RHEUM A:   Acute encephalopathy - resolved Pain due to ETT - improving  Acute Gout Flare - swollen, painful left 3rd digit of left hand, R elbow P:   - PRN analgesia - Initiated prednisone for acute gout, hold other agents due to renal insufficiency - Mobilize per PT  Scribed by Kandice Robinsons, RN, ACNP  Student [employer] for [name] NP-C  Patient seen and examined, agree with above note.  I dictated the care and orders written for this patient under my direction.  Alyson Reedy, MD [phone]

## 2013-06-03 NOTE — Progress Notes (Signed)
Patient was placed on mechanical ventilation to rest. RT will continue to monitor.

## 2013-06-03 NOTE — Progress Notes (Signed)
NUTRITION FOLLOW UP  Intervention:    Change EN regimen to Glucerna 1.2 formula at goal rate of 60 ml/hr with Prostat liquid protein 30 ml 3 times daily via tube to provide 2028 total kcals, 131 gm protein, 1159 ml of free water RD to follow for nutrition care plan  Nutrition Dx:   Inadequate oral intake related to inability to eat as evidenced by NPO status, ongoing  Goal:  Pt to meet >/= 90% of their estimated nutrition needs, met  Monitor:   EN regimen & tolerance, respiratory status, weight, labs, I/O's  Assessment:   Patient with history of COPD, OSA, and OHS, and recent intubation for acute respiratory failure admitted with dyspnea.   Patient s/p procedure 10/11:  TRACHEOSTOMY   Patient currently on nocturnal ventilatory support: MV: 7.4 Temp: 36.8  Glucerna 1.2 formula currently infusing at goal rate of 70 ml/hr via NGT with Prostat liquid protein 30 ml twice daily via tube providing 2216 kcals, 131 gm protein, 1352 ml of free water.  Disposition: patient going home with wife.  RD with EN management privileges, initially consulted 10/8.  Height: Ht Readings from Last 1 Encounters:  05/20/13 6' 0.83" (1.85 m)    Weight Status ---> fluctuating  Wt Readings from Last 1 Encounters:  06/02/13 267 lb (121.11 kg)    Body mass index is 35.39 kg/(m^2).  Re-estimated needs:  Kcal: 2000-2200 Protein: 125-135 gm Fluid: 2.2-2.3 L  Skin: neck incision   Diet Order: NPO   Intake/Output Summary (Last 24 hours) at 06/03/13 1105 Last data filed at 06/03/13 0900  Gross per 24 hour  Intake   3310 ml  Output   3300 ml  Net     10 ml    Labs:   Recent Labs Lab 05/31/13 0450 06/01/13 0415 06/03/13 0415  NA 145 142 151*  K 5.8* 4.7 5.4*  CL 98 98 108  CO2 37* 34* 36*  BUN 111* 106* 81*  CREATININE 2.08* 1.91* 1.72*  CALCIUM 9.0 8.9 9.2  MG  --   --  3.2*  PHOS  --   --  4.0  GLUCOSE 187* 165* 185*    CBG (last 3)   Recent Labs  06/03/13 0018  06/03/13 0420 06/03/13 0745  GLUCAP 144* 165* 174*    Scheduled Meds: . albuterol  2.5 mg Nebulization Q6H  . antiseptic oral rinse  1 application Mouth Rinse QID  . aspirin  81 mg Per Tube Daily  . atorvastatin  40 mg Per Tube q1800  . chlorhexidine  15 mL Mouth/Throat BID  . clopidogrel  75 mg Per Tube Daily  . famotidine  20 mg Per Tube BID  . feeding supplement (PRO-STAT SUGAR FREE 64)  30 mL Per Tube BID  . free water  350 mL Per Tube Q4H  . insulin aspart  0-15 Units Subcutaneous Q4H  . ipratropium  0.5 mg Nebulization Q6H  . traMADol  25 mg Per Tube Q6H    Continuous Infusions: . sodium chloride 30 mL/hr at 06/02/13 2207  . feeding supplement (GLUCERNA 1.2 CAL) 1,000 mL (06/02/13 2002)    Maureen Chatters, RD, LDN Pager #: [phone] After-Hours Pager #: [phone]

## 2013-06-04 ENCOUNTER — Inpatient Hospital Stay (HOSPITAL_COMMUNITY): Payer: Medicare Other

## 2013-06-04 ENCOUNTER — Inpatient Hospital Stay (HOSPITAL_COMMUNITY): Admission: RE | Admit: 2013-06-04 | Payer: Medicare Other | Source: Ambulatory Visit

## 2013-06-04 DIAGNOSIS — J441 Chronic obstructive pulmonary disease with (acute) exacerbation: Secondary | ICD-10-CM

## 2013-06-04 LAB — CBC
HCT: 29.5 % — ABNORMAL LOW (ref 39.0–52.0)
Hemoglobin: 8 g/dL — ABNORMAL LOW (ref 13.0–17.0)
MCV: 100.7 fL — ABNORMAL HIGH (ref 78.0–100.0)
RBC: 2.93 MIL/uL — ABNORMAL LOW (ref 4.22–5.81)
WBC: 9.5 10*3/uL (ref 4.0–10.5)

## 2013-06-04 LAB — GLUCOSE, CAPILLARY
Glucose-Capillary: 148 mg/dL — ABNORMAL HIGH (ref 70–99)
Glucose-Capillary: 160 mg/dL — ABNORMAL HIGH (ref 70–99)
Glucose-Capillary: 167 mg/dL — ABNORMAL HIGH (ref 70–99)
Glucose-Capillary: 138 mg/dL — ABNORMAL HIGH (ref 70–99)

## 2013-06-04 LAB — BASIC METABOLIC PANEL
BUN: 65 mg/dL — ABNORMAL HIGH (ref 6–23)
CO2: 38 mEq/L — ABNORMAL HIGH (ref 19–32)
Chloride: 105 mEq/L (ref 96–112)
Creatinine, Ser: 1.64 mg/dL — ABNORMAL HIGH (ref 0.50–1.35)
GFR calc non Af Amer: 44 mL/min — ABNORMAL LOW (ref >90)
Glucose, Bld: 150 mg/dL — ABNORMAL HIGH (ref 70–99)
Potassium: 4.7 mEq/L (ref 3.5–5.1)
Sodium: 149 mEq/L — ABNORMAL HIGH (ref 135–145)

## 2013-06-04 LAB — PROTIME-INR
INR: 1.11 (ref 0.00–1.49)
Prothrombin Time: 14.1 seconds (ref 11.6–15.2)

## 2013-06-04 MED ORDER — FUROSEMIDE 10 MG/ML IJ SOLN
40.0000 mg | Freq: Four times a day (QID) | INTRAMUSCULAR | Status: AC
  Administered 2013-06-04 – 2013-06-05 (×3): 40 mg via INTRAVENOUS
  Filled 2013-06-04 (×2): qty 4

## 2013-06-04 MED ORDER — DEXTROSE 5 % IV SOLN
INTRAVENOUS | Status: DC
  Administered 2013-06-04 – 2013-06-05 (×2): via INTRAVENOUS

## 2013-06-04 MED ORDER — ACETAMINOPHEN 160 MG/5ML PO SOLN
650.0000 mg | ORAL | Status: DC | PRN
  Administered 2013-06-04 – 2013-06-11 (×4): 650 mg
  Filled 2013-06-04 (×4): qty 20.3

## 2013-06-04 NOTE — Progress Notes (Signed)
PT Cancellation Note  Patient Details Name: ASHTIAN VILLACIS MRN: [id number] DOB: August 13, 1951   Cancelled Treatment:    Reason Eval/Treat Not Completed: Medical issues which prohibited therapy. Noted events of the am.  Spoke with nursing who states that pt needs to rest all day.  Will return in am and treat pt as able.  Thanks.   INGOLD,Sloan Galentine 06/04/2013, 10:50 AM [facility] [phone] (pager)

## 2013-06-04 NOTE — Progress Notes (Signed)
Chaplain responded to code blue. No family present and no assistance needed at this time.  Will follow as needed.  Rev. [place], [place] [phone]

## 2013-06-04 NOTE — Procedures (Signed)
Preop diagnosis: Tracheostomy dependence, hemoptysis Postop diagnosis: same Procedure: Tracheoscopy and trach tube change Surgeon: [name] Anesth: none Comp: none Findings.  Bleeding appears to be coming from an abrasion at the take-off of the right accessory bronchus which is where the end of the extended length tube sits versus where suction has been rubbing.  The rest of the trachea and proximal bronchi appears normal.  A #8 standard cuffed Shiley tube was replaced. Description: After discussing risks and benefits, the ventilator was detached from the indwelling trach tube and the fiberoptic scope was passed through the tube to evaluate the distal trachea and bronchi.  The cuff on the trach tube was deflated and the tube removed.  The scope was then passed through the stoma to view the remaining trachea and proximal bronchi.  This was done after gently suctioning his airway.  After completion, the scope was removed and a standard #6 cuffed Shiley tube was placed.  The scope was passed through the tube and the tip was felt to not be positioned well enough.  Thus, the tube was exchanged for a standard #8 cuffed Shiley tube and was better positioned.  He was put back on the ventilator and returned to nursing care in stable condition.

## 2013-06-04 NOTE — Progress Notes (Signed)
SLP Cancellation Note  Patient Details Name: MALEAK BRAZZEL MRN: [id number] DOB: Nov 29, 1950   Cancelled treatment:       Reason Eval/Treat Not Completed: Medical issues which prohibited therapy. Spoke with RN and noted events of this am. Patient now back on vent. Will f/u for appropriateness to resume SLP treatment.   [name] MA, CCC-SLP [phone]    Rozetta Stumpp [name] 06/04/2013, 10:26 AM

## 2013-06-04 NOTE — Progress Notes (Signed)
PULMONARY  / CRITICAL CARE MEDICINE  Name: [name] [name] MRN: [id number] DOB: Sep 15, 1950    ADMISSION DATE:  05/20/2013 CONSULTATION DATE:  05/20/2013  REFERRING MD :  EDP PRIMARY SERVICE:  PCCM  CHIEF COMPLAINT:  Dyspnea  BRIEF PATIENT DESCRIPTION: 62 y/o with past medical history of chronic severe restrictive pulmonary secondary to obesity and pleural fibrosis (previously reported as COPD), OSA with poor BiPAP compliance at home, OHS and recent intubation for acute respiratory failure brought to ED with progressive dyspnea and acute encephalopathy.  In ED intubated for airway protection.  PCCM was consulted.  SIGNIFICANT EVENTS / STUDIES:  10/07 - early AM 10/8 - intubated in the ED, admitted to ICU 10/08 - stable on vent, 2D echo >>> LA and LV mildly dilated, mild LVH, EF 55-60%, grade 1 diastolic dysfunction, septum with abnormal function/dyssynergy, mild AS, calcified mitral annulus 10/09 - urinary retention overnight, Foley replaced; ENT consult for tracheostomy eval (Dr. Jenne [name]) 10/10 - to OR for trach 10/13- did not tolerate PMV well 10/15- acidotic  At 5 am abg 10/16- Trach change to 6 Shiley cuffed 10/17 xlt cuffed distal>>>  LINES / TUBES: ETT 10/7 >> 10/10 Trach 10/10 (Bates)>>  Trach 10/15>> Downsized to 6 Shiley uncuffed  CULTURES: 10/7 Urine >>> 25k CFU/mL yeast Sputum 10/16>>>Few Gram positive rods  ANTIBIOTICS: Levaquin 10/7 >>> 10/7 Diflucan 10/21>>> SUBJECTIVE: States mouth feels better. Very anxious about episode of bradycardia/ tracheal bleeding  this am( 10/22).  VITAL SIGNS: Temp:  [97.3 F (36.3 C)-98.8 F (37.1 C)] 98.2 F (36.8 C) (10/22 0854) Pulse Rate:  [78-143] 123 (10/22 0854) Resp:  [15-34] 26 (10/22 0854) BP: (109-131)/(59-65) 120/62 mmHg (10/22 0854) SpO2:  [96 %-100 %] 100 % (10/22 0854) FiO2 (%):  [35 %-100 %] 40 % (10/22 0854)  VENTILATOR SETTINGS: Vent Mode:  [-] PRVC FiO2 (%):  [35 %-100 %] 40 % Set Rate:  [14 bmp] 14 bmp Vt  Set:  [450 mL] 450 mL PEEP:  [5 cmH20] 5 cmH20 Plateau Pressure:  [21 cmH20-28 cmH20] 21 cmH20  INTAKE / OUTPUT: Intake/Output     10/21 0701 - 10/22 0700 10/22 0701 - 10/23 0700   I.V. (mL/kg) 60 (0.5)    NG/GT 2713 410   Total Intake(mL/kg) 2773 (22.9) 410 (3.4)   Urine (mL/kg/hr) 3050 (1) 400 (0.9)   Total Output 3050 400   Net -277 +10         PHYSICAL EXAMINATION: General:  In bed, on vent, full support,anxious. Neuro:  A/O x 3 , moves all 4. HEENT:  Trach 6  xlt cuffed, T-collar No stridor, thrush noted, -JVD. Cardiovascular: Tachycardia,  2-3/6 murmur heard best at 4ICS nipple line Lungs:  Shallow excursion, labored, BR bloody secretions from trach.  Abdomen:  bowel sounds present, tolerating tube feeds. Musculoskeletal:  Has been moving all ext.and ambulating with PT.  LABS:  CHEMISTRY  Recent Labs Lab 05/30/13 0655 05/31/13 0450 06/01/13 0415 06/03/13 0415 06/04/13 0511  NA 146* 145 142 151* 149*  K 4.1 5.8* 4.7 5.4* 4.7  CL 97 98 98 108 105  CO2 38* 37* 34* 36* 38*  GLUCOSE 195* 187* 165* 185* 150*  BUN 102* 111* 106* 81* 65*  CREATININE 1.87* 2.08* 1.91* 1.72* 1.64*  CALCIUM 9.7 9.0 8.9 9.2 9.1  MG  --   --   --  3.2*  --   PHOS  --   --   --  4.0  --    Estimated Creatinine Clearance: 64.3  ml/min (by C-G formula based on Cr of 1.64). PCXR:  10/17 Unchanged lower opacities. Multiple small pleural effusions.   CXR 10/22,small pleural effusions remain, persistent mild left  basilar atx,   ASSESSMENT / PLAN:  PULMONARY A:   Acute on chronic respiratory failure - s/p Trach 10/10  Chronic restrictive pulmonary disease - hx of chronic pleural thickening, obesity.  Unlikely AECOPD  given known restrictive pathology OSA/OHS - with poor BiPAP compliance at home. S/p trach  Will be trach dep indef.  Down sized to 6 (cuffless) 10/15 Changed to 6 cuffed 10/17 due to large air leak and low exhaled volumes  Hypoxia- 10/21: Bright red bloody secretions with  clots, requiring frequent suctioning.  ? Airway Trauma/ Bloody secretions ( 10/22) Brady event 2/2 blood clot airway occlusion. P:   - 10/17-changed to 6 xlt distal , bronch performed and removed clots / blood - Rest on vent today with goal of trach collar when able.  - Continue Albuterol / Atrovent - SLP evaluation in process - Goal is vent at night and TC during the day. - Suspect airway trauma, in light of brady event 10/22 will ask Dr. Jenne [name] to come and evaluate the trach.  CARDIOVASCULAR A:  CAD s/p CABG.   Chronic diastolic CHF - without frank exacerbation  Transiently hypotensive after intubation - BP now improved Troponins negative Tachycardia TTE as above - known grade 1 diastolic dysfunction, ? new septal abnormality  P:  - Hold Metoprolol, Imdur - consider restart as BP tolerates.  Remains soft 10/21. - Monitor, tachycardia may be 2/2 anxiety.  RENAL A:   Acute on chronic renal failure  Hypernatremia Hyperkalemia-resolved 10/22 - Continued significant amount of insensible loss from trach secretions, sweat  P:   - Trend BMP- K now 4.7( 10/22) - Continue free water per tube, follow Na  - D5W@50  - Lasix 40 mg Q 6 iv x 3 doses - Potassium down trending with kayexalate on 10/21.   HEMATOLOGIC A:   Anemia - chronic likely related to multiple chronic illness. Hb stable 8-9. Thrombocytopenia - uncertain cause, chronic and present on admission, no obvious bleed Bloody secretions/ clots per trach P:  - Trend CBC. - SCDs. - Ambulation per PT as tolerated. - Avoid anticoagulants. - Avoid un-necessary suctioning - Dr. Jenne [name] consulted to look at trach.  ENDOCRINE  A:   DM2 - diet controlled ( tube feeds at goal) P:   - CBGs q 8 hrs - SSI if glu > 180   Infectious: A:  Thrush P:  Diflucan po started 10/21  NEURO/RHEUM A:   Acute encephalopathy - resolved Pain due to ETT - improving  Acute Gout Flare - swollen, painful left 3rd digit of left hand, R  elbow P:   - PRN analgesia - Initiated prednisone for acute gout, hold other agents due to renal insufficiency - Mobilize per PT as able.  Scribed by [name] Robinsons, RN, ACNP Student [name] [name] for [name] Brim NP-C  Called Dr. Jenne [name] to evaluate trach, CXR ordered and noted, no need for bronch at this time, continue removal of clots, the patient will need to be fully placed back on the vent for now until patient is seen by ENT.  The goal eventually will be TC during the day and vent at night.  Will need to decrease total body sodium as ordered via D5W and lasix as ordered.  AM labs ordered.  Will not wean today.  CC time 35 min.  Patient seen and  examined, agree with above note.  I dictated the care and orders written for this patient under my direction.  Rush Farmer, MD [phone]

## 2013-06-04 NOTE — Progress Notes (Signed)
Pt has had bright red blood clots that were suctioned and pt coughed up all night long. Upon suctioning this morning, this nurse met resistance and pt indicated he could not breathe well. Tried to suction again and sats dropped to 60s and RT called. Then HR dropped as low as 30s. Pt was bagged and suctioned bright red blood clots until sats returned to 100% and HR went up. Dr. Herma Carson notified and aware. Pt's wife, Corrie Dandy, called and updated on the episode by this nurse.

## 2013-06-04 NOTE — Progress Notes (Signed)
12 Days Post-Op  Subjective: Asked to evaluate patient due to bleeding through the trach tube since Friday.  He was changed to an extended length tube that day.  Bleeding is not coming around the tube but through it.  He is coughing up clots.  Objective: Vital signs in last 24 hours: Temp:  [98 F (36.7 C)-98.8 F (37.1 C)] 98.8 F (37.1 C) (10/22 1133) Pulse Rate:  [78-143] 120 (10/22 1212) Resp:  [15-34] 31 (10/22 1212) BP: (105-131)/(59-83) 105/83 mmHg (10/22 1212) SpO2:  [96 %-100 %] 100 % (10/22 1212) FiO2 (%):  [35 %-100 %] 40 % (10/22 1212) Last BM Date: 05/30/13  Intake/Output from previous day: 10/21 0701 - 10/22 0700 In: 2773 [I.V.:60; NG/GT:2713] Out: 3050 [Urine:3050] Intake/Output this shift: Total I/O In: 1000 [NG/GT:1000] Out: 650 [Urine:650]  General appearance: alert, cooperative, no distress and currently on ventilator. Neck: #6 distal extended length trach tube in place, bloody secretions suctioned through tube.  Lab Results:   Recent Labs  06/03/13 0415 06/04/13 0511  WBC 9.3 9.5  HGB 8.0* 8.0*  HCT 30.2* 29.5*  PLT 269 265   BMET  Recent Labs  06/03/13 0415 06/04/13 0511  NA 151* 149*  K 5.4* 4.7  CL 108 105  CO2 36* 38*  GLUCOSE 185* 150*  BUN 81* 65*  CREATININE 1.72* 1.64*  CALCIUM 9.2 9.1   PT/INR No results found for this basename: LABPROT, INR,  in the last 72 hours ABG No results found for this basename: PHART, PCO2, PO2, HCO3,  in the last 72 hours  Studies/Results: Dg Chest Port 1 View  06/04/2013   CLINICAL DATA:  Followup infiltrates  EXAM: PORTABLE CHEST - 1 VIEW  COMPARISON:  06/01/2013  FINDINGS: Cardiac shadow remains enlarged. A tracheostomy tube and nasogastric catheter are again seen and stable. Small pleural effusions remain as well as mild left basilar atelectasis. No new focal abnormality is seen.  IMPRESSION: Stable appearance when compared with previous exam.   Electronically Signed   By: Alcide Clever M.D.    On: 06/04/2013 08:16   Dg Abd Portable 1v  06/03/2013   CLINICAL DATA:  Respiratory failure.  EXAM: PORTABLE ABDOMEN - 1 VIEW  COMPARISON:  None.  FINDINGS: A nasogastric tube extends into the distal stomach. Visualized bowel gas shows no evidence of overt obstruction or significant ileus. No abnormal calcifications are identified. Soft tissue evaluation is limited due to body habitus.  IMPRESSION: No evidence of bowel obstruction or significant ileus.   Electronically Signed   By: Irish Lack M.D.   On: 06/03/2013 15:28    Anti-infectives: Anti-infectives   Start     Dose/Rate Route Frequency Ordered Stop   06/04/13 1000  fluconazole (DIFLUCAN) 40 MG/ML suspension 100 mg     100 mg Oral Daily 06/03/13 1426     06/03/13 1430  fluconazole (DIFLUCAN) 40 MG/ML suspension 200 mg     200 mg Oral  Once 06/03/13 1426 06/03/13 1700   05/22/13 0800  levofloxacin (LEVAQUIN) IVPB 750 mg  Status:  Discontinued     750 mg 100 mL/hr over 90 Minutes Intravenous Every 24 hours 05/21/13 0332 05/21/13 1039   05/21/13 0400  levofloxacin (LEVAQUIN) IVPB 750 mg     750 mg 100 mL/hr over 90 Minutes Intravenous  Once 05/21/13 0332 05/21/13 1610      Assessment/Plan: s/p Procedure(s): TRACHEOSTOMY (N/A) Fiberoptic exam of the trachea and primary bronchi was performed at the bedside.  See procedure note.  Bleeding  appears to be coming from an abrasion of the take-off of the right-sided accessory bronchus where the distal end of the extended length tube is rubbing.  I changed his trach tube to a standard #8 cuffed tube to alleviate the problem.  Speech path will need to try again with the Passy-Muir valve with this size tube.  He failed previously.  If the valve still does not work, perhaps a proximal extended tube will be best.  Will follow.  LOS: 15 days    Silvio Sausedo 06/04/2013

## 2013-06-04 NOTE — Progress Notes (Signed)
Call to pt's room by RN for pt desating into the 60's. Upon arrival the sats were still dropping and HR dropped into the 30s. Pt was unable to be bagged. Normal saline was pushed down pt's trach and suctioned multiple, tenacious blood clots were removed. Pt was bagged and sats returned to 100%. Pt was suctioned 2 more times still yielding multiple, bright red blood clots. Pt was placed back in bed and placed back on ventilator settings, 450/14/+5/100%. Pt is stable at this time. Will pass on all information to day shift RT.

## 2013-06-05 DIAGNOSIS — R042 Hemoptysis: Secondary | ICD-10-CM

## 2013-06-05 LAB — CBC
HCT: 29.4 % — ABNORMAL LOW (ref 39.0–52.0)
Hemoglobin: 8.1 g/dL — ABNORMAL LOW (ref 13.0–17.0)
MCHC: 27.6 g/dL — ABNORMAL LOW (ref 30.0–36.0)
Platelets: 257 10*3/uL (ref 150–400)
WBC: 10.3 10*3/uL (ref 4.0–10.5)

## 2013-06-05 LAB — COMPREHENSIVE METABOLIC PANEL
AST: 35 U/L (ref 0–37)
Albumin: 2.5 g/dL — ABNORMAL LOW (ref 3.5–5.2)
CO2: 35 mEq/L — ABNORMAL HIGH (ref 19–32)
Calcium: 8.9 mg/dL (ref 8.4–10.5)
Chloride: 99 mEq/L (ref 96–112)
Creatinine, Ser: 2.1 mg/dL — ABNORMAL HIGH (ref 0.50–1.35)
Sodium: 143 mEq/L (ref 135–145)
Total Bilirubin: 0.3 mg/dL (ref 0.3–1.2)
Total Protein: 7.2 g/dL (ref 6.0–8.3)

## 2013-06-05 LAB — GLUCOSE, CAPILLARY
Glucose-Capillary: 148 mg/dL — ABNORMAL HIGH (ref 70–99)
Glucose-Capillary: 152 mg/dL — ABNORMAL HIGH (ref 70–99)
Glucose-Capillary: 162 mg/dL — ABNORMAL HIGH (ref 70–99)
Glucose-Capillary: 172 mg/dL — ABNORMAL HIGH (ref 70–99)

## 2013-06-05 LAB — MAGNESIUM: Magnesium: 2.7 mg/dL — ABNORMAL HIGH (ref 1.5–2.5)

## 2013-06-05 LAB — PHOSPHORUS: Phosphorus: 4.1 mg/dL (ref 2.3–4.6)

## 2013-06-05 MED ORDER — METOPROLOL TARTRATE 1 MG/ML IV SOLN
5.0000 mg | Freq: Three times a day (TID) | INTRAVENOUS | Status: DC
  Administered 2013-06-05 – 2013-06-06 (×4): 5 mg via INTRAVENOUS
  Filled 2013-06-05 (×6): qty 5

## 2013-06-05 MED ORDER — DEXTROSE 5 % IV SOLN: INTRAVENOUS | Status: DC

## 2013-06-05 MED ORDER — ALPRAZOLAM 0.25 MG PO TABS
0.5000 mg | ORAL_TABLET | Freq: Once | ORAL | Status: AC
  Administered 2013-06-05: 0.5 mg via ORAL
  Filled 2013-06-05: qty 2

## 2013-06-05 MED ORDER — FREE WATER
250.0000 mL | Freq: Four times a day (QID) | Status: DC
  Administered 2013-06-05 – 2013-06-06 (×4): 250 mL

## 2013-06-05 NOTE — Progress Notes (Signed)
[facility] PCCM  Back to bedside for distress. Coughing persists Cannot pass suction catheter through #8 shiley, much resistance, not suctioning secretions. Similar to last week when shiley dislodged. Bronched, replaced XLT, see note. Some bright red blood of accessory bronchus noted, suctioned and no active bleeding at end of bronch XLT #6 in place qHS vent again this evening.  Yolonda Kida PCCM Pager: [phone] Cell: [phone] If no response, call [phone]

## 2013-06-05 NOTE — Progress Notes (Signed)
Physical Therapy Treatment Patient Details Name: [name] [name] MRN: [id number] DOB: 25-Apr-1951 Today's Date: 06/05/2013 Time: 4098-1191 PT Time Calculation (min): 35 min  PT Assessment / Plan / Recommendation  History of Present Illness 62 yo with past medical history of chronic severe restrictive pulmonary secondary to obesity and pleural fibrosis (previously reported as COPD), OSA with poor BiPAP compliance at home, OHS and recent intubation for acute respiratory failure brought to ED with progressive dyspnea and acute encephalopathy.  In ED intubated for airway protection. Trach placed 10/10 and pt now on trach collar.   PT Comments   Pt admitted with above. Pt currently with functional limitations due to continued deficits in balance and endurance.   Pt will benefit from skilled PT to increase their independence and safety with mobility to allow discharge to the venue listed below.   Follow Up Recommendations  Supervision/Assistance - 24 hour;Home health PT                 Equipment Recommendations  Wheelchair (measurements PT);Wheelchair cushion (measurements PT);Rolling walker with 5" wheels;3in1 (PT);Hospital bed        Frequency Min 3X/week   Progress towards PT Goals Progress towards PT goals: Progressing toward goals  Plan Current plan remains appropriate    Precautions / Restrictions Precautions Precautions: Fall Precaution Comments: trach with vent with 40% FiO2, panda Restrictions Weight Bearing Restrictions: No   Pertinent Vitals/Pain VSS, No pain other than from trying to have BM with constipation    Mobility  Bed Mobility Bed Mobility: Rolling Right;Right Sidelying to Sit;Sitting - Scoot to Edge of Bed Rolling Right: 4: Min guard;With rail Right Sidelying to Sit: 4: Min assist;HOB elevated;With rails Supine to Sit: 4: Min assist;HOB elevated Sitting - Scoot to Edge of Bed: 4: Min assist Details for Bed Mobility Assistance: min asssit for  lines Transfers Transfers: Sit to Stand;Stand to [employer] Transfers Sit to Stand: 4: Min guard;With upper extremity assist;From bed Stand to Sit: 4: Min guard;With upper extremity assist;To chair/3-in-1;With armrests Stand Pivot Transfers: 4: Min guard Details for Transfer Assistance: cueing for hand placement with assist to stand from surface.  Pt assisted with +2 for lines as pt on vent to get to 3N1.  Pt tried to have BM but is constipated.  Nurse in room and assisting PT as pt very uncomforable and trying to have BM.  Pt went a little.  Pt decided to sit up in chair therfore wswapped 3N1 for recliner.   Ambulation/Gait Ambulation/Gait Assistance: Not tested (comment) Stairs: No Wheelchair Mobility Wheelchair Mobility: No    Exercises General Exercises - Lower Extremity Ankle Circles/Pumps: AROM;Both;10 reps;Seated Long Arc Quad: AROM;Both;Seated;20 reps Hip Flexion/Marching: AROM;Both;Seated;20 reps    PT Goals (current goals can now be found in the care plan section)    Visit Information  Last PT Received On: 06/05/13 Assistance Needed: +2 History of Present Illness: 62 yo with past medical history of chronic severe restrictive pulmonary secondary to obesity and pleural fibrosis (previously reported as COPD), OSA with poor BiPAP compliance at home, OHS and recent intubation for acute respiratory failure brought to ED with progressive dyspnea and acute encephalopathy.  In ED intubated for airway protection. Trach placed 10/10 and pt now on trach collar.    Subjective Data  Subjective: Trach in place with pt on vent.  Pt mouths, "I need to get on the toilet."   Cognition  Cognition Arousal/Alertness: Awake/alert Behavior During Therapy: WFL for tasks assessed/performed Overall Cognitive Status: Within Functional  Limits for tasks assessed    Balance  Static Sitting Balance Static Sitting - Balance Support: No upper extremity supported Static Sitting - Level of  Assistance: 5: Stand by assistance Static Standing Balance Static Standing - Balance Support: Bilateral upper extremity supported;During functional activity Static Standing - Level of Assistance: 4: Min assist (steadying assist by PT)  End of Session PT - End of Session Equipment Utilized During Treatment: Gait belt (Ventilator) Activity Tolerance: Patient limited by fatigue Patient left: in chair;with call bell/phone within reach;with family/visitor present Nurse Communication: Mobility status   INGOLD,Jilene Spohr 06/05/2013, 4:18 PM [facility] [phone] (pager)

## 2013-06-05 NOTE — Progress Notes (Signed)
SLP Cancellation Note  Patient Details Name: [name] [name] MRN: [id number] DOB: 05-07-1951   Cancelled treatment:       Reason Eval/Treat Not Completed: Medical issues which prohibited therapy.  Patient back on vent. Plan to allow to rest/recover over the weekend and f/u for possible PMSV trials if stable off vent on Monday 10/27.  [name] MA, CCC-SLP [phone]    [name] 06/05/2013, 10:51 AM

## 2013-06-05 NOTE — Progress Notes (Signed)
Dr Vassie Loll called regarding pt's possible aspiration. Pt coughed up thick tan secretions into HME and vent tubing, RR 35. Pt sats maintaining high 90s on 40% Vent. Tube feeds held. Will continue to monitor pt.

## 2013-06-05 NOTE — Procedures (Signed)
Tracheostomy Change Note  Patient Details:   Name: [name] [name] DOB: 01-Sep-1950 MRN: [id number]    Airway Documentation:     Evaluation  O2 sats: stable throughout Complications: No apparent complications Patient did tolerate procedure well. Bilateral Breath Sounds: Rhonchi Suctioning: Airway Removed #8 Shiley and replaced with #6 XL shiley trach. Patient was stable during procedure. Bronchoscopy used for procedure. Sx small amount of thick mucus from trach.  Leafy Half 06/05/2013, 8:33 PM

## 2013-06-05 NOTE — Progress Notes (Signed)
PULMONARY  / CRITICAL CARE MEDICINE  Name: [name] [name] MRN: [id number] DOB: 07/04/1951    ADMISSION DATE:  05/20/2013 CONSULTATION DATE:  05/20/2013  REFERRING MD :  EDP PRIMARY SERVICE:  PCCM  CHIEF COMPLAINT:  Dyspnea  BRIEF PATIENT DESCRIPTION: 62 y/o with past medical history of chronic severe restrictive pulmonary secondary to obesity and pleural fibrosis (previously reported as COPD), OSA with poor BiPAP compliance at home, OHS and recent intubation for acute respiratory failure brought to ED with progressive dyspnea and acute encephalopathy.  In ED intubated for airway protection.  PCCM was consulted.  SIGNIFICANT EVENTS / STUDIES:  10/07 - early AM 10/8 - intubated in the ED, admitted to ICU 10/08 - stable on vent, 2D echo >>> LA and LV mildly dilated, mild LVH, EF 55-60%, grade 1 diastolic dysfunction, septum with abnormal function/dyssynergy, mild AS, calcified mitral annulus 10/09 - urinary retention overnight, Foley replaced; ENT consult for tracheostomy eval (Dr. [name]) 10/10 - to OR for trach 10/13- did not tolerate PMV well 10/15- acidotic  At 5 am abg 10/16- Trach change to 6 Shiley cuffed 10/17 xlt cuffed distal>>> 10/22- Trach changed to #8 Shiley cuffed from distal due to airway irritation and bleeding per ENT  LINES / TUBES: ETT 10/7 >> 10/10 Trach 10/10 (Bates)>>  Trach 10/15>> Downsized to 6 Shiley uncuffed  CULTURES: 10/7 Urine >>> 25k CFU/mL yeast Sputum 10/16>>>Few Gram positive rods  ANTIBIOTICS: Levaquin 10/7 >>> 10/7 Diflucan 10/21>>> SUBJECTIVE: Patient feels he is still having a lot of bleeding and needs to be suctioned frequently.  Mouth continues to feel better.  Nurses report that he has been tachycardic intermittently.  VITAL SIGNS: Temp:  [98.4 F (36.9 C)-101.3 F (38.5 C)] 99.7 F (37.6 C) (10/23 0741) Pulse Rate:  [53-145] 141 (10/23 1200) Resp:  [21-34] 24 (10/23 1200) BP: (90-134)/(58-93) 106/70 mmHg (10/23 1200) SpO2:  [96  %-100 %] 96 % (10/23 1200) FiO2 (%):  [40 %] 40 % (10/23 1200) Weight:  [267 lb 13.7 oz (121.5 kg)] 267 lb 13.7 oz (121.5 kg) (10/23 0503)  VENTILATOR SETTINGS: Vent Mode:  [-] PRVC FiO2 (%):  [40 %] 40 % Set Rate:  [14 bmp] 14 bmp Vt Set:  [450 mL] 450 mL PEEP:  [5 cmH20] 5 cmH20 Plateau Pressure:  [17 cmH20-22 cmH20] 21 cmH20  INTAKE / OUTPUT: Intake/Output     10/22 0701 - 10/23 0700 10/23 0701 - 10/24 0700   I.V. (mL/kg) 917.5 (7.6) 250 (2.1)   NG/GT 2520 770   Total Intake(mL/kg) 3437.5 (28.3) 1020 (8.4)   Urine (mL/kg/hr) 2575 (0.9) 450 (0.7)   Total Output 2575 450   Net +862.5 +570         PHYSICAL EXAMINATION: General:  In bed, on vent, full support,resting comfortably Neuro:  A/O x 3 , moves all 4. HEENT:  Trach #8 shiley, JVD. Cardiovascular: Tachycardia,  2-3/6 murmur heard best at 4ICS nipple line Lungs:  Intermittent rhonchii, BR bloody secretions from trach.  Abdomen:  bowel sounds present, tolerating tube feeds. Musculoskeletal:  No gross deformities, moves extremities x 4  LABS:  CHEMISTRY  Recent Labs Lab 05/31/13 0450 06/01/13 0415 06/03/13 0415 06/04/13 0511 06/05/13 0500  NA 145 142 151* 149* 143  K 5.8* 4.7 5.4* 4.7 4.4  CL 98 98 108 105 99  CO2 37* 34* 36* 38* 35*  GLUCOSE 187* 165* 185* 150* 180*  BUN 111* 106* 81* 65* 72*  CREATININE 2.08* 1.91* 1.72* 1.64* 2.10*  CALCIUM 9.0 8.9  9.2 9.1 8.9  MG  --   --  3.2*  --  2.7*  PHOS  --   --  4.0  --  4.1   Estimated Creatinine Clearance: 50.3 ml/min (by C-G formula based on Cr of 2.1). PCXR:  10/17 Unchanged lower opacities. Multiple small pleural effusions.   CXR NNF 10/22 small pleural effusions remain, persistent mild left basilar atx   ASSESSMENT / PLAN:  PULMONARY A:   Acute on chronic respiratory failure - s/p Trach 10/10  Chronic restrictive pulmonary disease - hx of chronic pleural thickening  Unlikely AECOPD  given known restrictive pathology OSA/OHS - S/p trach  Airway  Trauma/ Bloody secretions  P:   - 10/22 trach changed to #8 Shiley cuffed from long #6 cuffed due to bleeding - Avoid un-necessary suctioning - Attempt trach collar trials when secretions clear - Continue Albuterol / Atrovent - SLP evaluation in process - Goal is vent at night and TC during the day. - Trach dependent indefinitely  CARDIOVASCULAR A:  CAD s/p CABG.   Chronic diastolic CHF - without frank exacerbation  Transiently hypotensive after intubation - BP now improved Troponins negative Tachycardia  P:  - Restart metoprolol - monitor tachycardia  RENAL A:   Acute on chronic renal failure  Hypernatremia - resolved 10/23 Hyperkalemia-resolved 10/22  P:   - Trend BMP, monitor K - Continue free water per tube, decrease to 350 q6h - D5W@KVO  - Hold lasix for now, will reconsider in AM if renal function improves.  HEMATOLOGIC A:   Anemia - chronic likely related to multiple chronic illness. Hb stable 8-9. Thrombocytopenia - resolved 10/23 P:  - Intermittently check cbc - SCDs - Avoid anticoagulants.  ENDOCRINE  A:   DM2 - diet controlled ( tube feeds at goal) P:   - CBGs q 8 hrs - SSI if glu > 180   Infectious: A:  Thrush P:  Diflucan po day 2/7  NEURO/RHEUM A:   Acute encephalopathy - resolved Pain due to ETT - improving  Acute Gout Flare -resolved P:   - PRN analgesia - Mobilize per PT as able.  Patient's trach continues to have thick and bloody sputum.  If no improvement tomorrow in secretions will call Dr. [name] to take another look.  Sodium and potassium have trended downward, and will continue to use D5W and free water, but will decrease both as we don't want to fluid overload a CHF patient.  Creatinine has increased with lasix use, so hesitant to use.  Will reevaluate lasix use tomorrow.  Will continue to treat thrush.    Terri Piedra, Student-PA [place] 06/05/13, 1:13 pm  Patient seen and examined, agree with above note.  I  dictated the care and orders written for this patient under my direction.  Alyson Reedy, MD [phone]

## 2013-06-05 NOTE — Progress Notes (Signed)
[facility] PCCM  Asked to evaluate Mr. Trickett for vent alarm Breathing comfortably but anxious, O2 saturation normal, HR normal  Trach collar now Try vent again later this evening for qHS.  Yolonda Kida PCCM Pager: [phone] Cell: [phone] If no response, call [phone]

## 2013-06-05 NOTE — Procedures (Signed)
PCCM Bronchoscopy Procedure Note  The patient was informed of the risks (including but not limited to bleeding, infection, respiratory failure, lung injury, tooth/oral injury) and benefits of the procedure and gave consent, see chart.  Indication: dislodged trach, hemoptysis  Location: 2601  Condition pre procedure: mild respiratory distress  Medications for procedure: xanax  Procedure description: The bronchoscope was introduced through the tracheostoy and passed to the bilateral lungs to the level of the subsegmental bronchi throughout the tracheobronchial tree.  Airway exam revealed blood but no active bleeding at site of accessory bronchus; minimal thin secretions throughout tracheobronchial tree which were suctioned.  #6 XLT replaced  Procedures performed: replaced trach  Specimens sent: none  Condition post procedure: stable  Plan: minimize suctioning, back on vent tonight

## 2013-06-05 NOTE — Progress Notes (Signed)
eLink Physician-Brief Progress Note Patient Name: [name] [name] DOB: 07/10/1951 MRN: [id number]  Date of Service  06/05/2013   HPI/Events of Note     eICU Interventions  Xanax 0.5 mg x 1 for anxiety   Intervention Category Minor Interventions: Agitation / anxiety - evaluation and management  Jovana Rembold V. 06/05/2013, 6:05 PM

## 2013-06-06 ENCOUNTER — Inpatient Hospital Stay (HOSPITAL_COMMUNITY): Payer: Medicare Other

## 2013-06-06 LAB — BASIC METABOLIC PANEL
BUN: 79 mg/dL — ABNORMAL HIGH (ref 6–23)
BUN: 81 mg/dL — ABNORMAL HIGH (ref 6–23)
Calcium: 9 mg/dL (ref 8.4–10.5)
Chloride: 97 mEq/L (ref 96–112)
Creatinine, Ser: 2.26 mg/dL — ABNORMAL HIGH (ref 0.50–1.35)
GFR calc Af Amer: 34 mL/min — ABNORMAL LOW (ref >90)
GFR calc Af Amer: 35 mL/min — ABNORMAL LOW (ref >90)
GFR calc non Af Amer: 30 mL/min — ABNORMAL LOW (ref >90)
GFR calc non Af Amer: 30 mL/min — ABNORMAL LOW (ref >90)
Glucose, Bld: 158 mg/dL — ABNORMAL HIGH (ref 70–99)
Glucose, Bld: 103 mg/dL — ABNORMAL HIGH (ref 70–99)
Potassium: 5.2 mEq/L — ABNORMAL HIGH (ref 3.5–5.1)
Sodium: 139 mEq/L (ref 135–145)

## 2013-06-06 LAB — MAGNESIUM: Magnesium: 2.6 mg/dL — ABNORMAL HIGH (ref 1.5–2.5)

## 2013-06-06 LAB — CBC
MCH: 27.1 pg (ref 26.0–34.0)
MCHC: 28.1 g/dL — ABNORMAL LOW (ref 30.0–36.0)
Platelets: 232 10*3/uL (ref 150–400)
RDW: 15.8 % — ABNORMAL HIGH (ref 11.5–15.5)

## 2013-06-06 LAB — GLUCOSE, CAPILLARY
Glucose-Capillary: 130 mg/dL — ABNORMAL HIGH (ref 70–99)
Glucose-Capillary: 135 mg/dL — ABNORMAL HIGH (ref 70–99)
Glucose-Capillary: 141 mg/dL — ABNORMAL HIGH (ref 70–99)
Glucose-Capillary: 158 mg/dL — ABNORMAL HIGH (ref 70–99)
Glucose-Capillary: 168 mg/dL — ABNORMAL HIGH (ref 70–99)

## 2013-06-06 LAB — PHOSPHORUS: Phosphorus: 5.3 mg/dL — ABNORMAL HIGH (ref 2.3–4.6)

## 2013-06-06 MED ORDER — METOPROLOL TARTRATE 1 MG/ML IV SOLN
5.0000 mg | Freq: Three times a day (TID) | INTRAVENOUS | Status: DC
  Administered 2013-06-07: 5 mg via INTRAVENOUS
  Filled 2013-06-06 (×3): qty 5

## 2013-06-06 MED ORDER — RACEPINEPHRINE HCL 2.25 % IN NEBU
0.5000 mL | INHALATION_SOLUTION | RESPIRATORY_TRACT | Status: AC | PRN
  Filled 2013-06-06: qty 0.5

## 2013-06-06 MED ORDER — METOPROLOL TARTRATE 1 MG/ML IV SOLN: 5.0000 mg | Freq: Three times a day (TID) | INTRAVENOUS | Status: DC

## 2013-06-06 MED ORDER — FUROSEMIDE 10 MG/ML IJ SOLN
40.0000 mg | Freq: Once | INTRAMUSCULAR | Status: AC
  Administered 2013-06-06: 40 mg via INTRAVENOUS
  Filled 2013-06-06: qty 4

## 2013-06-06 NOTE — Consult Note (Signed)
[name] [name], [name] [name] [id number] July 05, 1951 [name] [name]*  Reason for Consult: bleeding/bloody secretions from tracheostomy  HPI: 62yo trached by Dr. [name] who is on Aspirin and Plavix and has had several trach changes recently and frequent bloody secretions from trach with clots, though to be coming from an accessory bronchus. ENT is consulted for bloody secretions from the trach.  Allergies:  Allergies  Allergen Reactions  . Amlodipine Besy-Benazepril Hcl Swelling    Lips swell  . Percocet [Oxycodone-Acetaminophen] Other (See Comments)    hallucinations    ROS: respiratory failure, bleeding from trach, otherwise negative x 10 systems except per HPI  PMH:  Past Medical History  Diagnosis Date  . Coronary atherosclerosis of unspecified type of vessel, native or graft   . Chronic pulmonary heart disease, unspecified   . Chronic respiratory failure   . Unspecified sleep apnea   . Chronic airway obstruction, not elsewhere classified   . Angina   . Shortness of breath   . Anemia   . Chronic kidney disease   . Anxiety   . Hypertension   . CHF (congestive heart failure)   . GERD (gastroesophageal reflux disease)   . History of gout   . Chronic diastolic CHF (congestive heart failure) 10/28/2007    Hospitalized with diastolic CHF controlled with diuresis and BiPAP 2D April 2012 EF 55-60 with severe LVH and grade 1 diastolic dysfunction    . 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 PVD (peripheral vascular disease) 07/13/2011    S/P Lt SFA PTA Sept 2009, known occluded Rt SFA   . Obstructive sleep apnea 10/28/2007    BiPAP 13/10 with O2 3 L Advanced.      . Swelling of limb     Venous Duplex, 09/03/2012 - no evidence of thrombus or thrombophelitis  . S/P CABG (coronary artery bypass graft)     Nuc, 09/17/2012 - Significant ST abnormalities that are suggestive of ischemia, low risk nuclear study  . TIA (transient ischemic attack)     2D Echo,  12/05/2010 - EF 55-60%, severe LVH  . PVD (peripheral vascular disease)   . COPD (chronic obstructive pulmonary disease)   . OSA (obstructive sleep apnea)   . Chronic renal insufficiency, stage III (moderate)   . Hyperlipidemia     FH:  Family History  Problem Relation Age of Onset  . Diabetes Sister     SH:  History   Social History  . Marital Status: Married    Spouse Name: N/A    Number of Children: N/A  . Years of Education: N/A   Occupational History  . Not on file.   Social History Main Topics  . Smoking status: Former Smoker -- 1.00 packs/day for 25 years    Types: Cigarettes    Quit date: 08/14/1998  . Smokeless tobacco: Never Used  . Alcohol Use: Yes     Comment: social  . Drug Use: No  . Sexual Activity: Yes   Other Topics Concern  . Not on file   Social History Narrative  . No narrative on file    PSH:  Past Surgical History  Procedure Laterality Date  . Coronary artery bypass graft  07/12/2000    x6,  . Appendectomy    . Bilateral vats ablation    . Coronary angioplasty with stent placement    . Hemmorhoids    . Lung sx  2005    Growth on outside of right lung  .  Esophagogastroduodenoscopy  08/11/2011    Procedure: ESOPHAGOGASTRODUODENOSCOPY (EGD);  Surgeon: Barrie Folk, MD;  Location: [facility] ENDOSCOPY;  Service: Endoscopy;  Laterality: N/A;  . Colonoscopy  08/11/2011    Procedure: COLONOSCOPY;  Surgeon: Barrie Folk, MD;  Location: [facility] ENDOSCOPY;  Service: Endoscopy;  Laterality: N/A;  . Cataract extraction    . Ptca  04/16/2008    Left SFA-60% stenosis stented with a 7x6 Nitinolself-expanding stent resulting in reduction of 60% mid segmental L SFA stenosis to 0% residual  . Cardiac catheterization  02/16/2010    RCA-80% stenosis stented with a 3x12 Taxus ION DES resulting in reduction of a total occlusion to 0%  . Cardiac catheterization  03/29/2009    Ostial/Proximal PDA-SVG-75% stenosis stented with a 3.0x23 Xience V DES reduction resulting in 75%  stenosis to 0%  . Cardiac catheterization  08/05/2004    Circumflex OM stented with a 2.5x13 Cypher stent resulting in reduction of 60% to 0%  . Cardiac catheterization  06/28/2000    Moderate left main and severe three-vessel disease, consider CABG  . Abdominal aortogram  12/03/2002    70% stenosis at the origin of the profunda femoris, 75% mid superficial femoral artery stenosis  . Tracheostomy tube placement N/A 05/23/2013    Procedure: TRACHEOSTOMY;  Surgeon: Christia Reading, MD;  Location: [facility] OR;  Service: ENT;  Laterality: N/A;    Physical  Exam: CN 2-12 grossly intact and symmetric. EAC/TMs normal BL. Oral cavity, lips, gums, ororpharynx normal with no masses or lesions. Skin warm and dry. Nasal cavity without polyps or purulence. External nose and ears without masses or lesions. EOMI, PERRLA. Neck with 6 cuffed XLT trach in place with some blood-tinged secretions in the vent circuit and trach.  Procedure Note: 31575 flexible tracheoscopy. Informed verbal consent was obtained after explaining the risks (including bleeding and infection), benefits and alternatives of the procedure. Verbal timeout was performed prior to the procedure. The vent circuit was temporarily removed. The 4mm flexible scope was advanced through the trach tube. The mucosa of the trachea distal to the tip of the tracheostomy tube appeared normal with no sources of bleeding. There was significant clot coming up from the right mainstem bronchus and sessile, nonobstructing clot on the right side of the carina. No bleeding was seen from around the tracheostomy stoma. The patient tolerated the procedure with no immediate complications.  A/P: persistant bleeding/bloddy tracheal secretions which appear to be coming from the right mainstem bronchus and are likely exacerbated by the patient's aspirin and plavix and frequent trach changes. Would treat with racemic epinephrine nebs as needed which will decrease the bleeding, and can use  topical lidocaine per the trach and gentle saline bullets via the trach to minimize coughing. No evidence of a source of bleeding in the trachea itself on tracheoscopy. Would minimize trach changes if possible and suction only as needed to clear the secretions, etc.   Melvenia Beam 06/06/2013 3:09 AM

## 2013-06-06 NOTE — Progress Notes (Signed)
PT Cancellation Note  Patient Details Name: KAREM TOMASO MRN: [id number] DOB: [birth year]   Cancelled Treatment:    Reason Eval/Treat Not Completed: Medical issues which prohibited therapy.  Pt had a bad night per nursing.  Nursing asked PT to HOLD therapy today.  PT will return Monday and continue as able.  Thanks.     INGOLD,Anaeli Cornwall 06/06/2013, 11:32 AM  [facility] [phone] (pager)

## 2013-06-06 NOTE — Progress Notes (Signed)
PULMONARY  / CRITICAL CARE MEDICINE  Name: [name] [name] MRN: [id number] DOB: 1950/12/28    ADMISSION DATE:  05/20/2013 CONSULTATION DATE:  05/20/2013  REFERRING MD :  EDP PRIMARY SERVICE:  PCCM  CHIEF COMPLAINT:  Dyspnea  BRIEF PATIENT DESCRIPTION: 62 y/o with past medical history of chronic severe restrictive pulmonary secondary to obesity and pleural fibrosis (previously reported as COPD), OSA with poor BiPAP compliance at home, OHS and recent intubation for acute respiratory failure brought to ED with progressive dyspnea and acute encephalopathy.  In ED intubated for airway protection.  PCCM was consulted.  SIGNIFICANT EVENTS / STUDIES:  10/07 - early AM 10/8 - intubated in the ED, admitted to ICU 10/08 - stable on vent, 2D echo >>> LA and LV mildly dilated, mild LVH, EF 55-60%, grade 1 diastolic dysfunction, septum with abnormal function/dyssynergy, mild AS, calcified mitral annulus 10/09 - urinary retention overnight, Foley replaced; ENT consult for tracheostomy eval (Dr. [name]) 10/10 - to OR for trach 10/13- did not tolerate PMV well 10/15- acidotic  At 5 am abg 10/16- Trach change to 6 Shiley cuffed 10/17 xlt cuffed distal>>> 10/22- Trach changed to #8 Shiley cuffed from distal due to airway irritation and bleeding per ENT 10/23 - Bronch for continued bloody discharge and shortness of breath 10/23 - Trach changed to XLT #6  LINES / TUBES: ETT 10/7 >> 10/10 Trach 10/10 (Bates)>>  Trach 10/15>> XLT #6  CULTURES: 10/7 Urine >>> 25k CFU/mL yeast Sputum 10/16>>>Few Gram positive rods  ANTIBIOTICS: Levaquin 10/7 >>> 10/7 Diflucan 10/21>>> SUBJECTIVE: Patient continues to have significant bloody discharge from trach today requiring change of suction tubing.  Patient is anxious because of bleeding  VITAL SIGNS: Temp:  [98.3 F (36.8 C)-99.4 F (37.4 C)] 98.4 F (36.9 C) (10/24 0800) Pulse Rate:  [85-146] 92 (10/24 0800) Resp:  [15-36] 36 (10/24 0800) BP:  (85-109)/(38-93) 108/73 mmHg (10/24 0800) SpO2:  [96 %-100 %] 100 % (10/24 0800) FiO2 (%):  [40 %] 40 % (10/24 0800) Weight:  [279 lb 5.2 oz (126.7 kg)] 279 lb 5.2 oz (126.7 kg) (10/24 0500)  VENTILATOR SETTINGS: Vent Mode:  [-] PRVC FiO2 (%):  [40 %] 40 % Set Rate:  [14 bmp] 14 bmp Vt Set:  [450 mL] 450 mL PEEP:  [5 cmH20] 5 cmH20  INTAKE / OUTPUT: Intake/Output     10/23 0701 - 10/24 0700 10/24 0701 - 10/25 0700   I.V. (mL/kg) 352.5 (2.8)    Other 60    NG/GT 1130    Total Intake(mL/kg) 1542.5 (12.2)    Urine (mL/kg/hr) 1540 (0.5) 350 (0.9)   Total Output 1540 350   Net +2.5 -350        Urine Occurrence 400 x    Stool Occurrence 2 x     PHYSICAL EXAMINATION: General:  In bed, on vent, full support,resting comfortably Neuro:  A/O x 3 , moves all 4. HEENT:  Trach #6 XLT, JVD, bloody discharge seen in suction tubing. Cardiovascular: Tachycardia,  2-3/6 murmur heard best at 4ICS nipple line Lungs:  Intermittent rhonchii, BR bloody secretions from trach.  Abdomen:  bowel sounds present, tolerating tube feeds. Musculoskeletal:  No gross deformities, moves extremities x 4  LABS:  CHEMISTRY  Recent Labs Lab 06/01/13 0415 06/03/13 0415 06/04/13 0511 06/05/13 0500 06/06/13 0550  NA 142 151* 149* 143 141  K 4.7 5.4* 4.7 4.4 4.9  CL 98 108 105 99 97  CO2 34* 36* 38* 35* 34*  GLUCOSE 165* 185*  150* 180* 158*  BUN 106* 81* 65* 72* 79*  CREATININE 1.91* 1.72* 1.64* 2.10* 2.26*  CALCIUM 8.9 9.2 9.1 8.9 9.0  MG  --  3.2*  --  2.7* 2.6*  PHOS  --  4.0  --  4.1 5.3*   Estimated Creatinine Clearance: 47.8 ml/min (by C-G formula based on Cr of 2.26). PCXR:  10/17 Unchanged lower opacities. Multiple small pleural effusions.   CXR 10/24 increase in effusions indicating worsening edema  ASSESSMENT / PLAN:  PULMONARY A:   Acute on chronic respiratory failure - s/p Trach 10/10  Chronic restrictive pulmonary disease - hx of chronic pleural thickening  Unlikely AECOPD  given  known restrictive pathology OSA/OHS - S/p trach  Airway Trauma/ Bloody secretions  P:   - 10/24 trach changed to #6 XLT - Racemic epi treatments for bleeding per ENT - Gentle saline to help with coughing per ENT - Avoid un-necessary suctioning beyond trach length. - Attempt trach collar trials when secretions clear. - Continue Albuterol / Atrovent - Goal is vent at night and TC during the day. - Trach dependent indefinitely  CARDIOVASCULAR A:  CAD s/p CABG.   Chronic diastolic CHF - without frank exacerbation  Transiently hypotensive - BP now improved Tachycardia  P:  - Cont metoprolol - Monitor tachycardia  RENAL A:   Acute on chronic renal failure  Hypernatremia - resolved 10/23 Hyperkalemia-resolved 10/22  P:   - Trend BMP, monitor K - D/C free water - D5W@KVO  - Lasix 40 mg as effusions worsening on chest xray  HEMATOLOGIC A:   Anemia - chronic likely related to multiple chronic illness. Hb stable 8-9. Thrombocytopenia - resolved 10/23 P:  - Intermittently check cbc - SCDs - Avoid anticoagulants.  ENDOCRINE  A:   DM2 - diet controlled ( tube feeds at goal) P:   - CBGs q 8 hrs - SSI if glu > 180   Infectious: A:  Thrush P: - Diflucan po day 3/7  NEURO/RHEUM A:   Acute encephalopathy - resolved Pain due to ETT - improving  Acute Gout Flare -resolved P:   - PRN analgesia - Mobilize per PT as able.  Courtney Forcucci, [employer] 06/06/13, 10:40 am  Patient's trach continues to have thick and bloody sputum.  Patient seen by Dr. Emeline Darling last night.  He recommended the use of racemic epi and to attempt to decrease the amount of suctioning and changes of the trach.  Patient also had some transient hypotension and tachypnea overnight.  Question as to whether tachypnea is due to anxiety, and if benzos may have had some effect in overnight hypotension.  There is slight worsening of pleural effusions on CXR.  Will give one dose of lasix and  will monitor closely due to two soft BP readings over the night.  Have discontinued free water at this time due to worsening of edema on CXR and Na levels have normalized.  It is unknown at this time how long it will take to fix bleeding around trach site.  May be prudent to revisit discussion of short term PEG as we solve this problem as patient is at an increased risk of sinusitis with NG tube for prolonged period.  Will continue with diflucan.     Patient seen and examined, agree with above note.  I dictated the care and orders written for this patient under my direction.  Alyson Reedy, MD [phone]

## 2013-06-07 ENCOUNTER — Inpatient Hospital Stay (HOSPITAL_COMMUNITY): Payer: Medicare Other

## 2013-06-07 DIAGNOSIS — I119 Hypertensive heart disease without heart failure: Secondary | ICD-10-CM

## 2013-06-07 LAB — CBC
HCT: 25.8 % — ABNORMAL LOW (ref 39.0–52.0)
Hemoglobin: 7.2 g/dL — ABNORMAL LOW (ref 13.0–17.0)
MCHC: 27.9 g/dL — ABNORMAL LOW (ref 30.0–36.0)
Platelets: 239 10*3/uL (ref 150–400)
RBC: 2.71 MIL/uL — ABNORMAL LOW (ref 4.22–5.81)
WBC: 9.3 10*3/uL (ref 4.0–10.5)

## 2013-06-07 LAB — GLUCOSE, CAPILLARY
Glucose-Capillary: 131 mg/dL — ABNORMAL HIGH (ref 70–99)
Glucose-Capillary: 135 mg/dL — ABNORMAL HIGH (ref 70–99)
Glucose-Capillary: 142 mg/dL — ABNORMAL HIGH (ref 70–99)
Glucose-Capillary: 144 mg/dL — ABNORMAL HIGH (ref 70–99)

## 2013-06-07 MED ORDER — METHYLPREDNISOLONE SODIUM SUCC 125 MG IJ SOLR
125.0000 mg | Freq: Once | INTRAMUSCULAR | Status: AC
  Administered 2013-06-07: 125 mg via INTRAVENOUS
  Filled 2013-06-07 (×2): qty 2

## 2013-06-07 MED ORDER — TRAMADOL HCL 50 MG PO TABS
50.0000 mg | ORAL_TABLET | Freq: Four times a day (QID) | ORAL | Status: DC
  Administered 2013-06-07 – 2013-06-11 (×11): 50 mg
  Filled 2013-06-07 (×12): qty 1

## 2013-06-07 MED ORDER — BUDESONIDE 0.5 MG/2ML IN SUSP
0.5000 mg | Freq: Two times a day (BID) | RESPIRATORY_TRACT | Status: DC
  Administered 2013-06-08 – 2013-06-12 (×8): 0.5 mg via RESPIRATORY_TRACT
  Filled 2013-06-07 (×14): qty 2

## 2013-06-07 MED ORDER — ARFORMOTEROL TARTRATE 15 MCG/2ML IN NEBU
15.0000 ug | INHALATION_SOLUTION | Freq: Two times a day (BID) | RESPIRATORY_TRACT | Status: DC
  Administered 2013-06-07 – 2013-06-12 (×10): 15 ug via RESPIRATORY_TRACT
  Filled 2013-06-07 (×13): qty 2

## 2013-06-07 NOTE — Progress Notes (Signed)
PULMONARY  / CRITICAL CARE MEDICINE  Name: [name] [name] MRN: [id number] DOB: 1950-12-02    ADMISSION DATE:  05/20/2013 CONSULTATION DATE:  05/20/2013  REFERRING MD :  EDP PRIMARY SERVICE:  PCCM  CHIEF COMPLAINT:  Dyspnea  BRIEF PATIENT DESCRIPTION: 62 y/o with past medical history of chronic severe restrictive pulmonary secondary to obesity and pleural fibrosis (previously reported as COPD), OSA with poor BiPAP compliance at home, OHS and recent intubation for acute respiratory failure brought to ED with progressive dyspnea and acute encephalopathy.  In ED intubated for airway protection.  PCCM was consulted.  SIGNIFICANT EVENTS / STUDIES:  10/07 - early AM 10/8 - intubated in the ED, admitted to ICU 10/08 - stable on vent, 2D echo >>> LA and LV mildly dilated, mild LVH, EF 55-60%, grade 1 diastolic dysfunction, septum with abnormal function/dyssynergy, mild AS, calcified mitral annulus 10/09 - urinary retention overnight, Foley replaced; ENT consult for tracheostomy eval (Dr. [name]) 10/10 - to OR for trach 10/13- did not tolerate PMV well 10/15- acidotic  At 5 am abg 10/16- Trach change to 6 Shiley cuffed 10/17 xlt cuffed distal>>> 10/22- Trach changed to #8 Shiley cuffed from distal due to airway irritation and bleeding per ENT 10/23 - Bronch for continued bloody discharge and shortness of breath 10/23 - Trach changed to XLT #6  LINES / TUBES: ETT 10/7 >> 10/10 Trach 10/10 (Bates)>>  Trach 10/15 = XLT #6 >>  CULTURES: 10/7 Urine >>> 25k CFU/mL yeast Sputum 10/16>>>Few Gram positive rods> nl flora Blood x 2  10/22 >>  ANTIBIOTICS: Levaquin 10/7 >>> 10/7 Diflucan 10/21>>>   SUBJECTIVE:   Wants ice chips, nad in chair  VITAL SIGNS: Temp:  [98.1 F (36.7 C)-98.6 F (37 C)] 98.4 F (36.9 C) (10/25 0742) Pulse Rate:  [69-93] 71 (10/25 0800) Resp:  [15-38] 22 (10/25 0800) BP: (82-107)/(53-72) 102/72 mmHg (10/25 0800) SpO2:  [96 %-100 %] 100 % (10/25 0800) FiO2 (%):   [40 %] 40 % (10/25 0751)  VENTILATOR SETTINGS: Vent Mode:  [-] PRVC FiO2 (%):  [40 %] 40 % Set Rate:  [14 bmp] 14 bmp Vt Set:  [450 mL] 450 mL PEEP:  [5 cmH20] 5 cmH20 Plateau Pressure:  [20 cmH20-30 cmH20] 20 cmH20  INTAKE / OUTPUT:  Intake/Output Summary (Last 24 hours) at 06/07/13 0913 Last data filed at 06/07/13 0800  Gross per 24 hour  Intake   1140 ml  Output   2000 ml  Net   -860 ml   PHYSICAL EXAMINATION: General:  In chair, miserable but no specific complaints Neuro:  A/O x 3 , moves all 4. HEENT:  Trach #6 XLT, JVD, bloody discharge seen in suction tubing. Cardiovascular: Tachycardia,  2-3/6 murmur heard best at 4ICS nipple line Lungs:  Min BR bloody secretions from trach/ marked air trapping on flow loops, corresponds to mid exp wheeze Abdomen:  bowel sounds present, tolerating tube feeds. Musculoskeletal:  No gross deformities, moves extremities x 4  LABS:  CHEMISTRY  Recent Labs Lab 06/03/13 0415 06/04/13 0511 06/05/13 0500 06/06/13 0550 06/06/13 1510  NA 151* 149* 143 141 139  K 5.4* 4.7 4.4 4.9 5.2*  CL 108 105 99 97 97  CO2 36* 38* 35* 34* 30  GLUCOSE 185* 150* 180* 158* 103*  BUN 81* 65* 72* 79* 81*  CREATININE 1.72* 1.64* 2.10* 2.26* 2.22*  CALCIUM 9.2 9.1 8.9 9.0 8.8  MG 3.2*  --  2.7* 2.6*  --   PHOS 4.0  --  4.1 5.3*  --    Estimated Creatinine Clearance: 48.6 ml/min (by C-G formula based on Cr of 2.22).   PCXR:  10/25 Stable small pleural effusions and bibasilar atelectatic change.  Stable cardiomegaly. Tube positions are not appreciably changed. No  pneumothorax.     ASSESSMENT / PLAN:  PULMONARY A:   Acute on chronic respiratory failure - s/p Trach 10/10  Chronic restrictive pulmonary disease - hx of chronic pleural thickening   OSA/OHS - S/p trach  Airway Trauma/ Bloody secretions on plavix/asa  Wheezing/ airtrapping noted am 10/25   P:   - 10/24 trach changed to #6 XLT - Racemic epi treatments for bleeding per ENT -  Gentle saline to help with coughing per ENT - Avoid un-necessary suctioning beyond trach length. - Attempt trach collar trials when secretions clear. - Continue Albuterol / Atrovent - Goal is vent at night and TC during the day. - Trach dependent indefinitely - changed lopressor to bisoprolol 10/25 due to wheeze/air trapping  - hold plavix and asa if worsens at all. - changed to brov/bud 10/25 and one dose solumedrol for wheezing  CARDIOVASCULAR A:  CAD s/p CABG.   Chronic diastolic CHF - without frank exacerbation  Transiently hypotensive - BP now improved Tachycardia  P:  - changed lopressor to bisoprolol am 10/25 due to wheeze - Monitor tachycardia  RENAL A:   Acute on chronic renal failure  Hypernatremia - resolved 10/23 Hyperkalemia-resolved 10/22  P:   - Trend BMP, monitor K - D/C free water - D5W@KVO     HEMATOLOGIC A:   Anemia - chronic likely related to multiple chronic illness. Hb stable 8-9. Thrombocytopenia - resolved 10/23 P:  - Intermittently check cbc - SCDs - Avoid anticoagulants though note on asa/plavix am 10/25  ENDOCRINE  A:   DM2 - diet controlled ( tube feeds at goal) P:   - CBGs q 8 hrs - SSI if glu > 180   Infectious: A:  Thrush P: - Diflucan per dashboard, to complete 7 d on 10/28  NEURO/RHEUM A:   Acute encephalopathy - resolved Pain due to ETT - improving  Acute Gout Flare -resolved P:   - PRN analgesia - Mobilize per PT as able.     Sandrea Hughs, MD Pulmonary and Critical Care Medicine [facility] Cell [phone] After 5:30 PM or weekends, call [phone]

## 2013-06-08 ENCOUNTER — Inpatient Hospital Stay (HOSPITAL_COMMUNITY): Payer: Medicare Other

## 2013-06-08 LAB — GLUCOSE, CAPILLARY
Glucose-Capillary: 202 mg/dL — ABNORMAL HIGH (ref 70–99)
Glucose-Capillary: 146 mg/dL — ABNORMAL HIGH (ref 70–99)
Glucose-Capillary: 178 mg/dL — ABNORMAL HIGH (ref 70–99)

## 2013-06-08 MED ORDER — PREDNISONE 5 MG/ML PO CONC
20.0000 mg | Freq: Every day | ORAL | Status: DC
  Administered 2013-06-08 – 2013-06-12 (×5): 20 mg
  Filled 2013-06-08 (×6): qty 4

## 2013-06-08 NOTE — Progress Notes (Signed)
Abdominal xray done to confirm placement of newly placed NG tube.  Called results to Dr. Sherene Sires, received permission to use NG tube in it's current location.  Will continue to monitor.

## 2013-06-08 NOTE — Progress Notes (Signed)
PULMONARY  / CRITICAL CARE MEDICINE  Name: [name] [name] MRN: [id number] DOB: Jan 01, 1951    ADMISSION DATE:  05/20/2013 CONSULTATION DATE:  05/20/2013  REFERRING MD :  EDP PRIMARY SERVICE:  PCCM  CHIEF COMPLAINT:  Dyspnea  BRIEF PATIENT DESCRIPTION: 62 y/o with past medical history of chronic severe restrictive pulmonary secondary to obesity and pleural fibrosis (previously reported as COPD), OSA with poor BiPAP compliance at home, OHS and recent intubation for acute respiratory failure brought to ED with progressive dyspnea and acute encephalopathy.  In ED intubated for airway protection.  PCCM was consulted.  SIGNIFICANT EVENTS / STUDIES:  10/07 - early AM 10/8 - intubated in the ED, admitted to ICU 10/08 - stable on vent, 2D echo >>> LA and LV mildly dilated, mild LVH, EF 55-60%, grade 1 diastolic dysfunction, septum with abnormal function/dyssynergy, mild AS, calcified mitral annulus 10/09 - urinary retention overnight, Foley replaced; ENT consult for tracheostomy eval (Dr. [name]) 10/10 - to OR for trach 10/13- did not tolerate PMV well 10/15- acidotic  At 5 am abg 10/16- Trach change to 6 Shiley cuffed 10/17 xlt cuffed distal>>> 10/22- Trach changed to #8 Shiley cuffed from distal due to airway irritation and bleeding per ENT 10/23 - Bronch for continued bloody discharge and shortness of breath 10/23 - Trach changed to XLT #6  LINES / TUBES: ETT 10/7 >> 10/10 Trach 10/10 (Bates)>>  Trach 10/15 = XLT #6 >>  CULTURES: 10/7 Urine >>> 25k CFU/mL yeast Sputum 10/16>>>Few Gram positive rods> nl flora Blood x 2  10/22 >>  ANTIBIOTICS: Levaquin 10/7 >>> 10/7 Diflucan 10/21>>>   SUBJECTIVE/overnight:     nad in chair/ no more hemoptysis, still on asa/plavix  VITAL SIGNS: Temp:  [98 F (36.7 C)-98.6 F (37 C)] 98.5 F (36.9 C) (10/26 0802) Pulse Rate:  [62-83] 69 (10/26 0802) Resp:  [15-29] 21 (10/26 0802) BP: (88-119)/(55-64) 119/62 mmHg (10/26 0802) SpO2:  [97  %-100 %] 97 % (10/26 0802) FiO2 (%):  [40 %] 40 % (10/26 0802)  VENTILATOR SETTINGS: Vent Mode:  [-] CPAP;PSV FiO2 (%):  [40 %] 40 % Set Rate:  [14 bmp] 14 bmp Vt Set:  [450 mL] 450 mL PEEP:  [5 cmH20] 5 cmH20 Pressure Support:  [10 cmH20] 10 cmH20 Plateau Pressure:  [12 cmH20-21 cmH20] 12 cmH20  INTAKE / OUTPUT:  Intake/Output Summary (Last 24 hours) at 06/08/13 0924 Last data filed at 06/08/13 0700  Gross per 24 hour  Intake   1805 ml  Output   1225 ml  Net    580 ml   PHYSICAL EXAMINATION: General:  In chair, miserable but no specific complaints Neuro:  A/O x 3 , moves all 4. HEENT:  Trach #6 XLT, JVD, bloody discharge seen in suction tubing. Cardiovascular: Tachycardia,  2-3/6 murmur heard best at 4ICS nipple line Lungs:  Min BR bloody secretions from trach/ marked air trapping on flow loops, corresponds to mid exp wheeze Abdomen:  bowel sounds present, tolerating tube feeds. Musculoskeletal:  No gross deformities, moves extremities x 4  LABS:  CHEMISTRY  Recent Labs Lab 06/03/13 0415 06/04/13 0511 06/05/13 0500 06/06/13 0550 06/06/13 1510  NA 151* 149* 143 141 139  K 5.4* 4.7 4.4 4.9 5.2*  CL 108 105 99 97 97  CO2 36* 38* 35* 34* 30  GLUCOSE 185* 150* 180* 158* 103*  BUN 81* 65* 72* 79* 81*  CREATININE 1.72* 1.64* 2.10* 2.26* 2.22*  CALCIUM 9.2 9.1 8.9 9.0 8.8  MG 3.2*  --  2.7* 2.6*  --   PHOS 4.0  --  4.1 5.3*  --    Estimated Creatinine Clearance: 48.6 ml/min (by C-G formula based on Cr of 2.22).   PCXR:  10/25 Stable small pleural effusions and bibasilar atelectatic change.  Stable cardiomegaly. Tube positions are not appreciably changed. No  pneumothorax.     ASSESSMENT / PLAN:  PULMONARY A:   Acute on chronic respiratory failure - s/p Trach 10/10  Chronic restrictive pulmonary disease - hx of chronic pleural thickening   OSA/OHS - S/p trach  Airway Trauma/ Bloody secretions on plavix/asa  Wheezing/ airtrapping noted am 10/25   P:   -  10/24 trach changed to #6 XLT - Racemic epi treatments for bleeding per ENT - Gentle saline to help with coughing per ENT - Avoid un-necessary suctioning beyond trach length. - Attempt trach collar trials when secretions clear.  - Goal is vent at night and TC during the day. - Trach dependent indefinitely - changed lopressor to bisoprolol 10/25 due to wheeze/air trapping  - hold plavix and asa if worsens at all. - changed to brov/bud 10/25 and one dose solumedrol for wheezing, improved 10/26 so added low dose prednisone   CARDIOVASCULAR A:  CAD s/p CABG.   Chronic diastolic CHF - without frank exacerbation  Transiently hypotensive - BP now improved Tachycardia  P:  - changed lopressor to bisoprolol am 10/25 due to wheeze - Monitor tachycardia  RENAL A:   Acute on chronic renal failure  Hypernatremia - resolved 10/23 Hyperkalemia-resolved 10/22 Lab Results  Component Value Date   CREATININE 2.22* 06/06/2013   CREATININE 2.26* 06/06/2013   CREATININE 2.10* 06/05/2013    P:   - Trend BMP, monitor K      HEMATOLOGIC A:   Anemia - chronic likely related to multiple chronic illness. Hb stable 8-9. Thrombocytopenia - resolved 10/23  P:  - Intermittently check cbc - SCDs - Avoid anticoagulants though note on asa/plavix am 10/25  ENDOCRINE  A:   DM2 - diet controlled ( tube feeds at goal) P:   - CBGs q 8 hrs - SSI if glu > 180   Infectious: A:  Thrush P: - Diflucan per dashboard, to complete 7 d on 10/28  NEURO/RHEUM A:   Acute encephalopathy - resolved Pain due to ETT - improving  Acute Gout Flare -resolved P:   - PRN analgesia - Mobilize per PT as able.     Sandrea Hughs, MD Pulmonary and Critical Care Medicine [facility] Cell [phone] After 5:30 PM or weekends, call [phone]

## 2013-06-09 ENCOUNTER — Inpatient Hospital Stay (HOSPITAL_COMMUNITY): Payer: Medicare Other

## 2013-06-09 LAB — GLUCOSE, CAPILLARY
Glucose-Capillary: 156 mg/dL — ABNORMAL HIGH (ref 70–99)
Glucose-Capillary: 110 mg/dL — ABNORMAL HIGH (ref 70–99)
Glucose-Capillary: 109 mg/dL — ABNORMAL HIGH (ref 70–99)
Glucose-Capillary: 119 mg/dL — ABNORMAL HIGH (ref 70–99)

## 2013-06-09 LAB — BASIC METABOLIC PANEL
CO2: 33 mEq/L — ABNORMAL HIGH (ref 19–32)
Calcium: 9 mg/dL (ref 8.4–10.5)
Chloride: 100 mEq/L (ref 96–112)
Creatinine, Ser: 1.99 mg/dL — ABNORMAL HIGH (ref 0.50–1.35)
GFR calc Af Amer: 40 mL/min — ABNORMAL LOW (ref >90)
Glucose, Bld: 100 mg/dL — ABNORMAL HIGH (ref 70–99)
Potassium: 4.8 mEq/L (ref 3.5–5.1)

## 2013-06-09 LAB — CBC
HCT: 28.5 % — ABNORMAL LOW (ref 39.0–52.0)
Hemoglobin: 8 g/dL — ABNORMAL LOW (ref 13.0–17.0)
MCH: 26.8 pg (ref 26.0–34.0)
MCV: 95.3 fL (ref 78.0–100.0)
Platelets: 274 10*3/uL (ref 150–400)
WBC: 9.9 10*3/uL (ref 4.0–10.5)

## 2013-06-09 MED ORDER — RACEPINEPHRINE HCL 2.25 % IN NEBU
0.5000 mL | INHALATION_SOLUTION | RESPIRATORY_TRACT | Status: DC | PRN
  Filled 2013-06-09: qty 0.5

## 2013-06-09 NOTE — Progress Notes (Signed)
Pt became unable to breathe.  Rt popped off trach and pt coughed out a large blood clot.  After, pt settled down and was fine.

## 2013-06-09 NOTE — Progress Notes (Signed)
SLP note  Change PMSV use to intermittently with staff and with po intake with FULL SUPERVISION.  [name] MA, CCC-SLP [phone]

## 2013-06-09 NOTE — Progress Notes (Signed)
Physical Therapy Treatment Patient Details Name: NAPHTALI ZYWICKI MRN: [id number] DOB: 08-Apr-1951 Today's Date: 06/09/2013 Time: 7829-5621 PT Time Calculation (min): 23 min  PT Assessment / Plan / Recommendation  History of Present Illness 62 yo with past medical history of chronic severe restrictive pulmonary secondary to obesity and pleural fibrosis (previously reported as COPD), OSA with poor BiPAP compliance at home, OHS and recent intubation for acute respiratory failure brought to ED with progressive dyspnea and acute encephalopathy.  In ED intubated for airway protection. Trach placed 10/10 and pt now on trach collar.   PT Comments   Pt very motivated & willing to participate in therapy.  Sitting up in recliner upon therapist arrival.  Feel pt could have increased ambulation distance but was limited due him being on vent at this time.  If pt cont's to be on vent would recommend coordinating with RT to allow increased ambulation distance.     Follow Up Recommendations  Supervision/Assistance - 24 hour;Home health PT     Does the patient have the potential to tolerate intense rehabilitation     Barriers to Discharge        Equipment Recommendations  Wheelchair (measurements PT);Wheelchair cushion (measurements PT);Rolling walker with 5" wheels;3in1 (PT);Hospital bed    Recommendations for Other Services    Frequency Min 3X/week   Progress towards PT Goals Progress towards PT goals: Progressing toward goals  Plan Current plan remains appropriate    Precautions / Restrictions Precautions Precautions: Fall Precaution Comments: trach with vent with 40% FiO2, panda Restrictions Weight Bearing Restrictions: No   Pertinent Vitals/Pain 02 sats 100% on 40% vent    Mobility  Bed Mobility Bed Mobility: Not assessed Transfers Transfers: Sit to Stand;Stand to Sit Sit to Stand: 5: Supervision;With upper extremity assist;With armrests;From chair/3-in-1 Stand to Sit: 5:  Supervision;With upper extremity assist;With armrests;To chair/3-in-1 Details for Transfer Assistance: Pt demonstrated safe hand placement & technique Ambulation/Gait Ambulation/Gait Assistance: 4: Min guard (+2 for equipment) Ambulation Distance (Feet): 12 Feet (4' x 3.  Distance limited by vent machine) Assistive device: Rolling walker Ambulation/Gait Assistance Details: Pt ambulated ~4' forwards + backwards x 3 without need to sit.  Distance forwards limited due to pt on vent machine.  Feel pt could increase distance but will need to coordinate with RT if on vent.   Gait Pattern: Step-through pattern;Decreased stride length Stairs: No Wheelchair Mobility Wheelchair Mobility: No    Exercises General Exercises - Lower Extremity Ankle Circles/Pumps: AROM;Both;10 reps Long Arc Quad: AROM;Strengthening;Both;10 reps Hip Flexion/Marching: AROM;Strengthening;Both;10 reps Toe Raises: AROM;Both;10 reps Heel Raises: AROM;Both;10 reps    PT Goals (current goals can now be found in the care plan section) Acute Rehab PT Goals PT Goal Formulation: With patient Time For Goal Achievement: 05/31/13 Potential to Achieve Goals: Good  Visit Information  Last PT Received On: 06/09/13 Assistance Needed: +2 (try to coordinate with RT) History of Present Illness: 62 yo with past medical history of chronic severe restrictive pulmonary secondary to obesity and pleural fibrosis (previously reported as COPD), OSA with poor BiPAP compliance at home, OHS and recent intubation for acute respiratory failure brought to ED with progressive dyspnea and acute encephalopathy.  In ED intubated for airway protection. Trach placed 10/10 and pt now on trach collar.    Subjective Data      Cognition  Cognition Arousal/Alertness: Awake/alert Behavior During Therapy: WFL for tasks assessed/performed Overall Cognitive Status: Within Functional Limits for tasks assessed    Balance     End of  Session PT - End of  Session Equipment Utilized During Treatment: Gait belt;Other (comment) (40% 02 on vent) Activity Tolerance: Patient tolerated treatment well Patient left: in chair;with call bell/phone within reach Nurse Communication: Mobility status   GP     Lara Mulch 06/09/2013, 2:00 PM  Verdell Face, PTA [phone] 06/09/2013

## 2013-06-09 NOTE — Progress Notes (Signed)
17 Days Post-Op  Subjective: Bleeding seems to have improved.  On ventilator currently.  Objective: Vital signs in last 24 hours: Temp:  [97.9 F (36.6 C)-98.7 F (37.1 C)] 97.9 F (36.6 C) (10/27 0753) Pulse Rate:  [56-71] 63 (10/27 0753) Resp:  [16-27] 18 (10/27 0753) BP: (102-130)/(58-94) 128/62 mmHg (10/27 0753) SpO2:  [99 %-100 %] 100 % (10/27 0753) FiO2 (%):  [40 %-100 %] 100 % (10/27 0753) Weight:  [120.2 kg (264 lb 15.9 oz)] 120.2 kg (264 lb 15.9 oz) (10/26 2123) Last BM Date: 06/06/13  Intake/Output from previous day: 10/26 0701 - 10/27 0700 In: 480 [I.V.:300; NG/GT:180] Out: 850 [Urine:850] Intake/Output this shift: Total I/O In: -  Out: 150 [Urine:150]  General appearance: alert, cooperative and no distress Neck: #6 XLT trach in place.  No active bleeding.  Breathing comfortably.  Lab Results:   Recent Labs  06/07/13 0550 06/09/13 0515  WBC 9.3 9.9  HGB 7.2* 8.0*  HCT 25.8* 28.5*  PLT 239 274   BMET  Recent Labs  06/06/13 1510 06/09/13 0515  NA 139 143  K 5.2* 4.8  CL 97 100  CO2 30 33*  GLUCOSE 103* 100*  BUN 81* 97*  CREATININE 2.22* 1.99*  CALCIUM 8.8 9.0   PT/INR No results found for this basename: LABPROT, INR,  in the last 72 hours ABG No results found for this basename: PHART, PCO2, PO2, HCO3,  in the last 72 hours  Studies/Results: Dg Chest Port 1 View  06/09/2013   CLINICAL DATA:  FOLLOW UP hemoptysis.  EXAM: PORTABLE CHEST - 1 VIEW  COMPARISON:  Chest radiograph June 07, 2013  FINDINGS: Cardiac silhouette remains at least moderately enlarged, status post median sternotomy. Blunting of the bilateral costophrenic angles persists with bibasilar strandy densities. Mild central pulmonary vasculature congestion. No pneumothorax. Interval removal of nasogastric tube, tracheostomy tube tip projects 7 cm above equina. Soft tissue planes and included osseous structures are nonsuspicious.  IMPRESSION: Stable cardiomegaly with small  bilateral pleural effusions versus pleural thickening, bibasilar presumed atelectasis.  Interval removal of feeding tube, no apparent change in position of tracheostomy tube.   Electronically Signed   By: Awilda Metro   On: 06/09/2013 05:54   Dg Abd Portable 1v  06/08/2013   CLINICAL DATA:  Nasogastric tube placement  EXAM: PORTABLE ABDOMEN - 1 VIEW  COMPARISON:  06/03/2013  FINDINGS: Study limited by motion and portable technique.  Nasogastric tube projects over the proximal stomach. The level of side port is indeterminate, possibly near the gastroesophageal junction.  Upper abdominal bowel gas pattern shows no overt obstruction. There is a retrocardiac opacity, also seen on chest radiograph 06/07/2013.  IMPRESSION: Gastric suction tube is coiled in the proximal stomach.   Electronically Signed   By: Tiburcio Pea M.D.   On: 06/08/2013 21:00    Anti-infectives: Anti-infectives   Start     Dose/Rate Route Frequency Ordered Stop   06/04/13 1000  fluconazole (DIFLUCAN) 40 MG/ML suspension 100 mg     100 mg Oral Daily 06/03/13 1426     06/03/13 1430  fluconazole (DIFLUCAN) 40 MG/ML suspension 200 mg     200 mg Oral  Once 06/03/13 1426 06/03/13 1700   05/22/13 0800  levofloxacin (LEVAQUIN) IVPB 750 mg  Status:  Discontinued     750 mg 100 mL/hr over 90 Minutes Intravenous Every 24 hours 05/21/13 0332 05/21/13 1039   05/21/13 0400  levofloxacin (LEVAQUIN) IVPB 750 mg     750 mg 100 mL/hr  over 90 Minutes Intravenous  Once 05/21/13 0332 05/21/13 1610      Assessment/Plan: s/p Procedure(s): TRACHEOSTOMY (N/A) Seems to be doing better with changes to suction protocol.  Continue to avoid deep suctioning.  LOS: 20 days    [name] [name] 06/09/2013

## 2013-06-09 NOTE — Progress Notes (Signed)
Bedside RN reports that he dislodged his feeding tube again. Checked with D Tyson Alias here in e-link and the plan is to leave tube out until day shift.

## 2013-06-09 NOTE — Progress Notes (Signed)
SLP Cancellation Note  Patient Details Name: [name] [name] MRN: [id number] DOB: 1950-12-19   Cancelled treatment:       Reason Eval/Treat Not Completed: Medical issues which prohibited therapy.  Patient remains on ventilator. Will f/u for readiness for PMSV trials when off vent.  [name] MA, CCC-SLP [phone]    [name] 06/09/2013, 8:39 AM

## 2013-06-09 NOTE — Evaluation (Signed)
Clinical/Bedside Swallow Evaluation Patient Details  Name: [name] [name] MRN: [id number] Date of Birth: [birth year]  Today's Date: 06/09/2013 Time: 1200-1215 SLP Time Calculation (min): 15 min  Past Medical History:  Past Medical History  Diagnosis Date  . Coronary atherosclerosis of unspecified type of vessel, native or graft   . Chronic pulmonary heart disease, unspecified   . Chronic respiratory failure   . Unspecified sleep apnea   . Chronic airway obstruction, not elsewhere classified   . Angina   . Shortness of breath   . Anemia   . Chronic kidney disease   . Anxiety   . Hypertension   . CHF (congestive heart failure)   . GERD (gastroesophageal reflux disease)   . History of gout   . Chronic diastolic CHF (congestive heart failure) 10/28/2007    Hospitalized with diastolic CHF controlled with diuresis and BiPAP 2D April 2012 EF 55-60 with severe LVH and grade 1 diastolic dysfunction    . 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 PVD (peripheral vascular disease) 07/13/2011    S/P Lt SFA PTA Sept 2009, known occluded Rt SFA   . Obstructive sleep apnea 10/28/2007    BiPAP 13/10 with O2 3 L Advanced.      . Swelling of limb     Venous Duplex, 09/03/2012 - no evidence of thrombus or thrombophelitis  . S/P CABG (coronary artery bypass graft)     Nuc, 09/17/2012 - Significant ST abnormalities that are suggestive of ischemia, low risk nuclear study  . TIA (transient ischemic attack)     2D Echo, 12/05/2010 - EF 55-60%, severe LVH  . PVD (peripheral vascular disease)   . COPD (chronic obstructive pulmonary disease)   . OSA (obstructive sleep apnea)   . Chronic renal insufficiency, stage III (moderate)   . Hyperlipidemia    Past Surgical History:  Past Surgical History  Procedure Laterality Date  . Coronary artery bypass graft  07/12/2000    x6,  . Appendectomy    . Bilateral vats ablation    . Coronary angioplasty with stent placement    .  Hemmorhoids    . Lung sx  2005    Growth on outside of right lung  . Esophagogastroduodenoscopy  08/11/2011    Procedure: ESOPHAGOGASTRODUODENOSCOPY (EGD);  Surgeon: Barrie Folk, MD;  Location: [facility] ENDOSCOPY;  Service: Endoscopy;  Laterality: N/A;  . Colonoscopy  08/11/2011    Procedure: COLONOSCOPY;  Surgeon: Barrie Folk, MD;  Location: [facility] ENDOSCOPY;  Service: Endoscopy;  Laterality: N/A;  . Cataract extraction    . Ptca  04/16/2008    Left SFA-60% stenosis stented with a 7x6 Nitinolself-expanding stent resulting in reduction of 60% mid segmental L SFA stenosis to 0% residual  . Cardiac catheterization  02/16/2010    RCA-80% stenosis stented with a 3x12 Taxus ION DES resulting in reduction of a total occlusion to 0%  . Cardiac catheterization  03/29/2009    Ostial/Proximal PDA-SVG-75% stenosis stented with a 3.0x23 Xience V DES reduction resulting in 75% stenosis to 0%  . Cardiac catheterization  08/05/2004    Circumflex OM stented with a 2.5x13 Cypher stent resulting in reduction of 60% to 0%  . Cardiac catheterization  06/28/2000    Moderate left main and severe three-vessel disease, consider CABG  . Abdominal aortogram  12/03/2002    70% stenosis at the origin of the profunda femoris, 75% mid superficial femoral artery stenosis  . Tracheostomy tube  placement N/A 05/23/2013    Procedure: TRACHEOSTOMY;  Surgeon: Christia Reading, MD;  Location: [facility] OR;  Service: ENT;  Laterality: N/A;   HPI:  62 yo with past medical history of chronic severe restrictive pulmonary secondary to obesity and pleural fibrosis (previously reported as COPD), OSA with poor BiPAP compliance at home, OHS and recent intubation for acute respiratory failure brought to ED with progressive dyspnea and acute encephalopathy.  In ED intubated for airway protection. Trach placed 10/10 and pt now on trach collar.   Assessment / Plan / Recommendation Clinical Impression  Bedside evaluation of swallowing complete in this 62 year old  male who is s/p tracheostomy to determine readiness for an instrumental study. Patient did endorse consuming water over the weekend without difficulty. Slight delayed cough noted of unknown origin. Otherwise, swallowing abilitites appear functional. Given intubation and multiple trach changes, will proceed with instrumental study this pm.     Aspiration Risk  Moderate    Diet Recommendation NPO   Medication Administration: Via alternative means    Other  Recommendations Recommended Consults: FEES Oral Care Recommendations: Oral care Q4 per protocol   Follow Up Recommendations  Home health SLP       Pertinent Vitals/Pain none        Swallow Study    General HPI: 62 yo with past medical history of chronic severe restrictive pulmonary secondary to obesity and pleural fibrosis (previously reported as COPD), OSA with poor BiPAP compliance at home, OHS and recent intubation for acute respiratory failure brought to ED with progressive dyspnea and acute encephalopathy.  In ED intubated for airway protection. Trach placed 10/10 and pt now on trach collar. Type of Study: Bedside swallow evaluation Previous Swallow Assessment: none Diet Prior to this Study: NPO Temperature Spikes Noted: No Respiratory Status: Trach collar Trach Size and Type: #6;Extra long;Cuff;Deflated;With PMSV in place History of Recent Intubation: Yes Length of Intubations (days): 3 days Date extubated: 05/23/13 Behavior/Cognition: Alert;Cooperative Oral Cavity - Dentition: Adequate natural dentition Self-Feeding Abilities: Able to feed self Patient Positioning: Upright in chair Baseline Vocal Quality: Clear Volitional Cough: Strong Volitional Swallow: Able to elicit    Oral/Motor/Sensory Function Overall Oral Motor/Sensory Function: Appears within functional limits for tasks assessed   Ice Chips Ice chips: Within functional limits Presentation: Spoon;Self Fed   Thin Liquid Thin Liquid: Impaired Presentation:  Cup;Self Fed Pharyngeal  Phase Impairments: Cough - Delayed (expectoration of thin, bloody secretions)    Nectar Thick Nectar Thick Liquid: Not tested   Honey Thick Honey Thick Liquid: Not tested   Puree Puree: Not tested   Solid   GO   Shakema Surita MA, CCC-SLP [phone]  Solid: Not tested       Jariyah Hackley [name] 06/09/2013,1:39 PM

## 2013-06-09 NOTE — Progress Notes (Signed)
PULMONARY  / CRITICAL CARE MEDICINE  Name: [name] [name] MRN: [id number] DOB: 1951-05-01    ADMISSION DATE:  05/20/2013 CONSULTATION DATE:  05/20/2013  REFERRING MD :  EDP PRIMARY SERVICE:  PCCM  CHIEF COMPLAINT:  Dyspnea  BRIEF PATIENT DESCRIPTION: 62 y/o with past medical history of chronic severe restrictive pulmonary secondary to obesity and pleural fibrosis (previously reported as COPD), OSA with poor BiPAP compliance at home, OHS and recent intubation for acute respiratory failure brought to ED with progressive dyspnea and acute encephalopathy.  In ED intubated for airway protection.  PCCM was consulted.  SIGNIFICANT EVENTS / STUDIES:  10/07 - early AM 10/8 - intubated in the ED, admitted to ICU 10/08 - stable on vent, 2D echo >>> LA and LV mildly dilated, mild LVH, EF 55-60%, grade 1 diastolic dysfunction, septum with abnormal function/dyssynergy, mild AS, calcified mitral annulus 10/09 - urinary retention overnight, Foley replaced; ENT consult for tracheostomy eval (Dr. [name]) 10/10 - to OR for trach 10/13- did not tolerate PMV well 10/15- acidotic  At 5 am abg 10/16- Trach change to 6 Shiley cuffed 10/17 xlt cuffed distal>>> 10/22- Trach changed to #8 Shiley cuffed from distal due to airway irritation and bleeding per ENT 10/23 - Bronch for continued bloody discharge and shortness of breath 10/23 - Trach changed to XLT #6  LINES / TUBES: ETT 10/7 >> 10/10 Trach 10/10 ([place])>>  Trach 10/15 = XLT #6 >>  CULTURES: 10/7 Urine >>> 25k CFU/mL yeast Sputum 10/16>>>Few Gram positive rods> nl flora Blood x 2  10/22 >> negative  ANTIBIOTICS: Levaquin 10/7 >>> 10/7 Diflucan 10/21>>>    SUBJECTIVE/overnight:   NAD, resting comfortably in chair.  About to work with PT/OT.  NG tube dislodged last night  VITAL SIGNS: Temp:  [97.9 F (36.6 C)-98.7 F (37.1 C)] 97.9 F (36.6 C) (10/27 0753) Pulse Rate:  [56-71] 63 (10/27 0753) Resp:  [16-27] 18 (10/27 0753) BP:  (102-130)/(58-94) 128/62 mmHg (10/27 0753) SpO2:  [99 %-100 %] 100 % (10/27 0753) FiO2 (%):  [40 %-100 %] 100 % (10/27 0753) Weight:  [264 lb 15.9 oz (120.2 kg)] 264 lb 15.9 oz (120.2 kg) (10/26 2123)  VENTILATOR SETTINGS: Vent Mode:  [-] PSV;CPAP FiO2 (%):  [40 %-100 %] 100 % Set Rate:  [14 bmp] 14 bmp Vt Set:  [450 mL] 450 mL PEEP:  [5 cmH20] 5 cmH20 Pressure Support:  [10 cmH20] 10 cmH20 Plateau Pressure:  [20 cmH20-30 cmH20] 21 cmH20  INTAKE / OUTPUT:  Intake/Output Summary (Last 24 hours) at 06/09/13 1007 Last data filed at 06/09/13 0837  Gross per 24 hour  Intake    300 ml  Output   1225 ml  Net   -925 ml   PHYSICAL EXAMINATION: General:  In chair, resting comfortably Neuro:  A/O x 3 , moves all 4. HEENT:  Trach #6 XLT, JVD, decreased bloody discharge seen in suction tubing. Cardiovascular: Tachycardia,  2-3/6 murmur heard best at 4ICS nipple line Lungs:  Min BR bloody secretions from trach, CTA, no noted wheeze Abdomen:  bowel sounds present, tolerating tube feeds.  Musculoskeletal:  No gross deformities, moves extremities x 4  LABS:  CHEMISTRY  Recent Labs Lab 06/03/13 0415 06/04/13 0511 06/05/13 0500 06/06/13 0550 06/06/13 1510 06/09/13 0515  NA 151* 149* 143 141 139 143  K 5.4* 4.7 4.4 4.9 5.2* 4.8  CL 108 105 99 97 97 100  CO2 36* 38* 35* 34* 30 33*  GLUCOSE 185* 150* 180* 158* 103* 100*  BUN 81* 65* 72* 79* 81* 97*  CREATININE 1.72* 1.64* 2.10* 2.26* 2.22* 1.99*  CALCIUM 9.2 9.1 8.9 9.0 8.8 9.0  MG 3.2*  --  2.7* 2.6*  --   --   PHOS 4.0  --  4.1 5.3*  --   --    Estimated Creatinine Clearance: 52.8 ml/min (by C-G formula based on Cr of 1.99).   PCXR:  10/25 Continued small pleural effusions and bibasilar atelectatic change.     ASSESSMENT / PLAN:  PULMONARY A:   Acute on chronic respiratory failure - s/p Trach 10/10  Chronic restrictive pulmonary disease - hx of chronic pleural thickening   OSA/OHS - S/p trach  Airway Trauma/ Bloody  secretions on plavix/asa  Wheezing/ airtrapping noted am 10/25   P:   - Racemic epi treatments for bleeding per ENT - Gentle saline to help with coughing per ENT - Avoid un-necessary suctioning beyond trach length. - Attempt trach collar trials when secretions clear. - Goal is vent at night and TC during the day. - Trach dependent indefinitely - BDs as ordered - Low dose prednisone Day 2/3  CARDIOVASCULAR A:  CAD s/p CABG.   Chronic diastolic CHF - without frank exacerbation  Transiently hypotensive - BP now improved Tachycardia  P:  - Monitor tachycardia  RENAL A:   Acute on chronic renal failure  Hypernatremia - resolved 10/23 Hyperkalemia-resolved 10/22 Lab Results  Component Value Date   CREATININE 1.99* 06/09/2013   CREATININE 2.22* 06/06/2013   CREATININE 2.26* 06/06/2013    P:   - Trend BMP, monitor K  HEMATOLOGIC A:   Anemia - chronic likely related to multiple chronic illness. Hb stable 8-9. Thrombocytopenia - resolved 10/23  P:  - Intermittently check cbc - SCDs - Cont Asa/plavix therapy.  Consider d/c if bleeding continues/worsens  ENDOCRINE  A:   DM2 - diet controlled ( tube feeds at goal) P:   - CBGs q 8 hrs - SSI if glu > 180  - reinsert NG tube to continue tube feedings  Infectious: A:  Thrush P: - Diflucan Day 6/7  NEURO/RHEUM A:   Acute encephalopathy - resolved Pain due to ETT - improving  Acute Gout Flare -resolved P:   - PRN analgesia - Mobilize per PT as able.  Carley Hammed Physician Assistant Student 06/09/13, 10:24 am  Bleeding from trach site continues to decrease with change of suction protocol, no deep suction.  Will continue with minimal suctioning and no further than trach length to avoid irritation.  Okay to continue ASA and plavix at this time, but hold if bleeding worsens.  Patient had wheezing and air trapping on 10/26.  Was started on low dose prednisone with a single dose of solumedrol.  Wheezing not  heard on exam today will continue low dose prednisone x 3 days before d/c'ing.  NG tube dislodged last night.  Will check swallow evaluation today now that he is off the vent.  If fails will place NGT.  TC during the day and full vent support at night (may use home vent).  Patient seen and examined, agree with above note.  I dictated the care and orders written for this patient under my direction.  Alyson Reedy, MD [phone]

## 2013-06-09 NOTE — Procedures (Signed)
Objective Swallowing Evaluation: Fiberoptic Endoscopic Evaluation of Swallowing  Patient Details  Name: [name] [name] MRN: [id number] Date of Birth: 31-Dec-1950  Today's Date: 06/09/2013 Time: 4098-1191 SLP Time Calculation (min): 35 min  Past Medical History:  Past Medical History  Diagnosis Date  . Coronary atherosclerosis of unspecified type of vessel, native or graft   . Chronic pulmonary heart disease, unspecified   . Chronic respiratory failure   . Unspecified sleep apnea   . Chronic airway obstruction, not elsewhere classified   . Angina   . Shortness of breath   . Anemia   . Chronic kidney disease   . Anxiety   . Hypertension   . CHF (congestive heart failure)   . GERD (gastroesophageal reflux disease)   . History of gout   . Chronic diastolic CHF (congestive heart failure) 10/28/2007    Hospitalized with diastolic CHF controlled with diuresis and BiPAP 2D April 2012 EF 55-60 with severe LVH and grade 1 diastolic dysfunction    . 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 PVD (peripheral vascular disease) 07/13/2011    S/P Lt SFA PTA Sept 2009, known occluded Rt SFA   . Obstructive sleep apnea 10/28/2007    BiPAP 13/10 with O2 3 L Advanced.      . Swelling of limb     Venous Duplex, 09/03/2012 - no evidence of thrombus or thrombophelitis  . S/P CABG (coronary artery bypass graft)     Nuc, 09/17/2012 - Significant ST abnormalities that are suggestive of ischemia, low risk nuclear study  . TIA (transient ischemic attack)     2D Echo, 12/05/2010 - EF 55-60%, severe LVH  . PVD (peripheral vascular disease)   . COPD (chronic obstructive pulmonary disease)   . OSA (obstructive sleep apnea)   . Chronic renal insufficiency, stage III (moderate)   . Hyperlipidemia    Past Surgical History:  Past Surgical History  Procedure Laterality Date  . Coronary artery bypass graft  07/12/2000    x6,  . Appendectomy    . Bilateral vats ablation    .  Coronary angioplasty with stent placement    . Hemmorhoids    . Lung sx  2005    Growth on outside of right lung  . Esophagogastroduodenoscopy  08/11/2011    Procedure: ESOPHAGOGASTRODUODENOSCOPY (EGD);  Surgeon: Barrie Folk, MD;  Location: [facility] ENDOSCOPY;  Service: Endoscopy;  Laterality: N/A;  . Colonoscopy  08/11/2011    Procedure: COLONOSCOPY;  Surgeon: Barrie Folk, MD;  Location: [facility] ENDOSCOPY;  Service: Endoscopy;  Laterality: N/A;  . Cataract extraction    . Ptca  04/16/2008    Left SFA-60% stenosis stented with a 7x6 Nitinolself-expanding stent resulting in reduction of 60% mid segmental L SFA stenosis to 0% residual  . Cardiac catheterization  02/16/2010    RCA-80% stenosis stented with a 3x12 Taxus ION DES resulting in reduction of a total occlusion to 0%  . Cardiac catheterization  03/29/2009    Ostial/Proximal PDA-SVG-75% stenosis stented with a 3.0x23 Xience V DES reduction resulting in 75% stenosis to 0%  . Cardiac catheterization  08/05/2004    Circumflex OM stented with a 2.5x13 Cypher stent resulting in reduction of 60% to 0%  . Cardiac catheterization  06/28/2000    Moderate left main and severe three-vessel disease, consider CABG  . Abdominal aortogram  12/03/2002    70% stenosis at the origin of the profunda femoris, 75% mid superficial femoral  artery stenosis  . Tracheostomy tube placement N/A 05/23/2013    Procedure: TRACHEOSTOMY;  Surgeon: Christia Reading, MD;  Location: [facility] OR;  Service: ENT;  Laterality: N/A;   HPI:  62 yo with past medical history of chronic severe restrictive pulmonary secondary to obesity and pleural fibrosis (previously reported as COPD), OSA with poor BiPAP compliance at home, OHS and recent intubation for acute respiratory failure brought to ED with progressive dyspnea and acute encephalopathy.  In ED intubated for airway protection. Trach placed 10/10 and pt now on trach collar.     Assessment / Plan / Recommendation Clinical Impression  Dysphagia  Diagnosis: Suspected primary esophageal dysphagia;Moderate cervical esophageal phase dysphagia;Moderate pharyngeal phase dysphagia Clinical impression: Patient presents with a moderate, suspected primary esophageal dysphagia with a resultant pharyngeal component. Overall function characterized by a relatively timely swallow with good pharyngeal clearance with only trace-mild residuals remaining immediately post swallow. Within 2-3 seconds however, backflow of bolus noted from esophagus, resulting in residuals around CP segment and leading to intermittent penetration with intact sensation and clearance with spontaneous throat clearing/cough and dry swallow. Additional clinician cueing for chin tuck posture and alternation of liquids and solids aided in relaxing UES, decreasing backflow of bolus, and decreasing penetration episodes. Unknown origin of backflow; ? edema vs GER. Recommend initiation of conservative diet, dysphagia 2 (chopped) with thin liquid, with strict aspiration precautions. SLP will f/u closely for tolerance. Recommend repeat instrumental testing (MBS to visualize cervical esophagus) in 3-5 days to determine potential to advance diet and/or lift precautions.      Treatment Recommendation  Therapy as outlined in treatment plan below    Diet Recommendation Dysphagia 2 (Fine chop);Thin liquid   Liquid Administration via: Cup;No straw Medication Administration: Crushed with puree Supervision: Patient able to self feed;Full supervision/cueing for compensatory strategies Compensations: Slow rate;Small sips/bites;Multiple dry swallows after each bite/sip;Follow solids with liquid Postural Changes and/or Swallow Maneuvers: Chin tuck;Seated upright 90 degrees;Upright 30-60 min after meal    Other  Recommendations Recommended Consults: MBS (3-5 days) Oral Care Recommendations: Oral care BID   Follow Up Recommendations  Home health SLP    Frequency and Duration min 3x week  2 weeks            General HPI: 62 yo with past medical history of chronic severe restrictive pulmonary secondary to obesity and pleural fibrosis (previously reported as COPD), OSA with poor BiPAP compliance at home, OHS and recent intubation for acute respiratory failure brought to ED with progressive dyspnea and acute encephalopathy.  In ED intubated for airway protection. Trach placed 10/10 and pt now on trach collar. Type of Study: Fiberoptic Endoscopic Evaluation of Swallowing Reason for Referral: Objectively evaluate swallowing function Previous Swallow Assessment: none Diet Prior to this Study: NPO Temperature Spikes Noted: No Respiratory Status: Trach collar Trach Size and Type: #6;Extra long;Cuff;Deflated;With PMSV in place History of Recent Intubation: Yes Length of Intubations (days): 3 days Date extubated: 05/23/13 Behavior/Cognition: Alert;Cooperative Oral Cavity - Dentition: Adequate natural dentition Oral Motor / Sensory Function: Within functional limits Self-Feeding Abilities: Able to feed self Patient Positioning: Upright in chair Baseline Vocal Quality: Clear Volitional Cough: Strong Volitional Swallow: Able to elicit Anatomy: Other (Comment) (mild pharyngeal edema) Pharyngeal Secretions: Standing secretions in (comment) (around UES)    Reason for Referral Objectively evaluate swallowing function   Oral Phase Oral Preparation/Oral Phase Oral Phase: WFL   Pharyngeal Phase Pharyngeal Phase Pharyngeal Phase: Impaired Pharyngeal - Thin Pharyngeal - Ice Chips: Delayed swallow initiation;Premature spillage to  pyriform sinuses;Penetration/Aspiration after swallow;Pharyngeal residue - cp segment Penetration/Aspiration details (ice chips): Material enters airway, CONTACTS cords then ejected out Pharyngeal - Thin Cup: Pharyngeal residue - cp segment;Delayed swallow initiation;Premature spillage to valleculae;Penetration/Aspiration after swallow Penetration/Aspiration details (thin  cup): Material enters airway, CONTACTS cords then ejected out Pharyngeal - Thin Straw: Pharyngeal residue - cp segment Pharyngeal - Solids Pharyngeal - Puree: Delayed swallow initiation;Premature spillage to valleculae;Pharyngeal residue - cp segment;Pharyngeal residue - valleculae Pharyngeal - Mechanical Soft: Delayed swallow initiation;Premature spillage to valleculae;Pharyngeal residue - valleculae;Pharyngeal residue - cp segment  Cervical Esophageal Phase    GO    Cervical Esophageal Phase Cervical Esophageal Phase: Impaired Cervical Esophageal Phase - Thin Thin Cup: Reduced cricopharyngeal relaxation;Esophageal backflow into the pharynx Thin Straw: Reduced cricopharyngeal relaxation;Esophageal backflow into the pharynx Cervical Esophageal Phase - Solids Puree: Reduced cricopharyngeal relaxation;Esophageal backflow into the pharynx Mechanical Soft: Reduced cricopharyngeal relaxation;Esophageal backflow into the pharynx        [name] MA, CCC-SLP [phone]  Ege Muckey [name] 06/09/2013, 3:24 PM

## 2013-06-09 NOTE — Progress Notes (Signed)
Speech Language Pathology Treatment: [name] Speaking valve  Patient Details Name: [name] [name] MRN: [id number] DOB: 1951/06/17 Today's Date: 06/09/2013 Time: 1140-1200 SLP Time Calculation (min): 20 min  Assessment / Plan / Recommendation Clinical Impression  F/u PMSV trials complete. Janina Mayo now a #6.0 XLT trach in place. Leak speech noted immediately upon cuff deflation indicative of patent upper airway. Valve placed by SLP once secretions cleared (orally and via trach). Min verbal cueing provided for increased volume. Phonation clear and breath support good for participation in basic conversation. Vitals remained stable. Patient without overt evidence of CO2 retention however did endorse feeling slightly light headed. Valve removed following session. Plan for FEES this pm to assess swallow. Overall, patient with good tolerance of PMSV however given tenuous medical status overall, recommend PMSV use with full supervision.    HPI HPI: 62 yo with past medical history of chronic severe restrictive pulmonary secondary to obesity and pleural fibrosis (previously reported as COPD), OSA with poor BiPAP compliance at home, OHS and recent intubation for acute respiratory failure brought to ED with progressive dyspnea and acute encephalopathy.  In ED intubated for airway protection. Trach placed 10/10 and pt now on trach collar.   Pertinent Vitals none  SLP Plan  Goals updated    Recommendations Medication Administration: Via alternative means      Patient may use Passy-Muir Speech Valve: Intermittently with supervision PMSV Supervision: Full       Oral Care Recommendations: Oral care Q4 per protocol Follow up Recommendations: Home health SLP Plan: Goals updated    GO   [name] MA, CCC-SLP [phone]   Idalee Foxworthy [name] 06/09/2013, 1:14 PM

## 2013-06-10 LAB — GLUCOSE, CAPILLARY
Glucose-Capillary: 70 mg/dL (ref 70–99)
Glucose-Capillary: 107 mg/dL — ABNORMAL HIGH (ref 70–99)

## 2013-06-10 LAB — BASIC METABOLIC PANEL
Calcium: 8.7 mg/dL (ref 8.4–10.5)
Chloride: 99 mEq/L (ref 96–112)
GFR calc Af Amer: 58 mL/min — ABNORMAL LOW (ref >90)
GFR calc Af Amer: 45 mL/min — ABNORMAL LOW (ref >90)
GFR calc non Af Amer: 50 mL/min — ABNORMAL LOW (ref >90)
GFR calc non Af Amer: 39 mL/min — ABNORMAL LOW (ref >90)
Glucose, Bld: 136 mg/dL — ABNORMAL HIGH (ref 70–99)
Potassium: 4.1 mEq/L (ref 3.5–5.1)
Potassium: 5 mEq/L (ref 3.5–5.1)
Sodium: 140 mEq/L (ref 135–145)
Sodium: 140 mEq/L (ref 135–145)

## 2013-06-10 MED ORDER — GLUCERNA SHAKE PO LIQD
237.0000 mL | Freq: Two times a day (BID) | ORAL | Status: DC
  Administered 2013-06-10 – 2013-06-11 (×3): 237 mL via ORAL

## 2013-06-10 NOTE — Progress Notes (Addendum)
NUTRITION FOLLOW UP  Intervention:    Glucerna Shake twice daily (220 kcals, 10 gm protein per 8 fl oz bottle) RD to follow for nutrition care plan  New Nutrition Dx:   Increased nutrient needs related to wound healing as evidenced by estimated nutrition needs, ongoing  Goal:   Pt to meet >/= 90% of their estimated nutrition needs, progressing  Monitor:   PO & supplemental intake, weight, labs, I/O's  Assessment:   Patient with PMH of COPD, OSA, OHS and recent intubation for acute respiratory failure admitted with dyspnea.   Patient s/p procedure 10/11:  TRACHEOSTOMY   Patient currently on nocturnal ventilatory support:  MV: 8.6 Temp: 37.0  Patient pulled out NGT 10/27, 1237 AM.  Feeding tube not replaced and Glucerna 1.2 formula discontinued.  S/p FEES 10/27 -- advanced to Dys 2--thin liquid diet with strict aspiration precautions.  Would benefit from addition of nutrition supplements to maximize kcal, protein intake -- RD to order.  Disposition: patient going home with wife.  Height: Ht Readings from Last 1 Encounters:  05/20/13 6' 0.83" (1.85 m)    Weight Status ---> stable Wt Readings from Last 1 Encounters:  06/08/13 264 lb 15.9 oz (120.2 kg)    Body mass index is 35.12 kg/(m^2).  Re-estimated needs:  Kcal: 2100-2200 Protein: 125-135 gm Fluid: 2.1-2.2 L  Skin: Stage II pressure ulcer to thigh  Diet Order: Dysphagia 2, thin liquids   Intake/Output Summary (Last 24 hours) at 06/10/13 1352 Last data filed at 06/10/13 1300  Gross per 24 hour  Intake      0 ml  Output   2750 ml  Net  -2750 ml    Labs:   Recent Labs Lab 06/05/13 0500 06/06/13 0550 06/06/13 1510 06/09/13 0515 06/10/13 0440  NA 143 141 139 143 140  K 4.4 4.9 5.2* 4.8 4.1  CL 99 97 97 100 102  CO2 35* 34* 30 33* 24  BUN 72* 79* 81* 97* 24*  CREATININE 2.10* 2.26* 2.22* 1.99* 1.46*  CALCIUM 8.9 9.0 8.8 9.0 8.7  MG 2.7* 2.6*  --   --   --   PHOS 4.1 5.3*  --   --   --   GLUCOSE  180* 158* 103* 100* 136*    CBG (last 3)   Recent Labs  06/10/13 0407 06/10/13 0747 06/10/13 1252  GLUCAP 70 98 139*    Scheduled Meds: . antiseptic oral rinse  1 application Mouth Rinse QID  . arformoterol  15 mcg Nebulization BID  . aspirin  81 mg Per Tube Daily  . atorvastatin  40 mg Per Tube q1800  . budesonide  0.5 mg Nebulization BID  . chlorhexidine  15 mL Mouth/Throat BID  . clopidogrel  75 mg Per Tube Daily  . famotidine  20 mg Per Tube BID  . feeding supplement (PRO-STAT SUGAR FREE 64)  30 mL Per Tube TID  . insulin aspart  0-15 Units Subcutaneous Q4H  . predniSONE  20 mg Per Tube Q breakfast  . traMADol  50 mg Per Tube Q6H    Continuous Infusions: . sodium chloride 30 mL/hr at 06/02/13 2207  . dextrose 10 mL (06/05/13 1345)  . feeding supplement (GLUCERNA 1.2 CAL) Stopped (06/09/13 0024)    Maureen Chatters, RD, LDN Pager #: [phone] After-Hours Pager #: [phone]

## 2013-06-10 NOTE — Progress Notes (Signed)
Arrived to pt's room to start trach education with pt and wife. Wife is not present at this time. I left my name and number with RN, will call when wife arrives.

## 2013-06-10 NOTE — Progress Notes (Signed)
Speech Language Pathology Treatment: Dysphagia;Passy Muir Speaking valve  Patient Details Name: [name] [name] MRN: [id number] DOB: 26-Oct-1950 Today's Date: 06/10/2013 Time: 1600-1630 SLP Time Calculation (min): 30 min  Assessment / Plan / Recommendation Clinical Impression  Pt seen for PMSV and swallowing treatment. Pt wore PMSV for approximately 20 minutes, achieving adequate phonation with VS stable throughout. No evidence of CO2 retention noted. Pt verbalized his swallowing compensatory strategies with Min cues for recall prior to engaging in PO trials of dys 2 textures and thin liquids. Min cues were provided during PO intake for utilization of strategies. Immediate throat clear was observed x2 throughout consumption; suspect sensed penetration with clearance as observed during FEES 10/27. Speaking valve was removed upon completion of therapy. Recommend to continue current diet as well as intermittent PMSV use with full supervision.   HPI HPI: 62 yo with past medical history of chronic severe restrictive pulmonary secondary to obesity and pleural fibrosis (previously reported as COPD), OSA with poor BiPAP compliance at home, OHS and recent intubation for acute respiratory failure brought to ED with progressive dyspnea and acute encephalopathy.  In ED intubated for airway protection. Trach placed 10/10 and pt now on trach collar.   Pertinent Vitals N/A  SLP Plan  Continue with current plan of care    Recommendations Diet recommendations: Dysphagia 2 (fine chop);Thin liquid Liquids provided via: Cup Medication Administration: Crushed with puree Supervision: Patient able to self feed;Full supervision/cueing for compensatory strategies Compensations: Slow rate;Small sips/bites;Multiple dry swallows after each bite/sip;Follow solids with liquid Postural Changes and/or Swallow Maneuvers: Chin tuck;Seated upright 90 degrees;Upright 30-60 min after meal      Patient may use Passy-Muir Speech  Valve: Intermittently with supervision PMSV Supervision: Full MD: Please consider changing trach tube to : Smaller size;Cuffless       Oral Care Recommendations: Oral care BID Follow up Recommendations: Home health SLP Plan: Continue with current plan of care    GO     Maxcine Ham 06/10/2013, 4:55 PM  Maxcine Ham, M.A. CCC-SLP [phone]

## 2013-06-10 NOTE — Progress Notes (Signed)
PULMONARY  / CRITICAL CARE MEDICINE  Name: [name] [name] MRN: [id number] DOB: 04-Nov-1950    ADMISSION DATE:  05/20/2013 CONSULTATION DATE:  05/20/2013  REFERRING MD :  EDP PRIMARY SERVICE:  PCCM  CHIEF COMPLAINT:  Dyspnea  BRIEF PATIENT DESCRIPTION: 62 y/o with past medical history of chronic severe restrictive pulmonary secondary to obesity and pleural fibrosis (previously reported as COPD), OSA with poor BiPAP compliance at home, OHS and recent intubation for acute respiratory failure brought to ED with progressive dyspnea and acute encephalopathy.  In ED intubated for airway protection.  PCCM was consulted.  SIGNIFICANT EVENTS / STUDIES:  10/07 - early AM 10/8 - intubated in the ED, admitted to ICU 10/08 - stable on vent, 2D echo >>> LA and LV mildly dilated, mild LVH, EF 55-60%, grade 1 diastolic dysfunction, septum with abnormal function/dyssynergy, mild AS, calcified mitral annulus 10/09 - urinary retention overnight, Foley replaced; ENT consult for tracheostomy eval (Dr. [name]) 10/10 - to OR for trach 10/13- did not tolerate PMV well 10/15- acidotic  At 5 am abg 10/16- Trach change to 6 Shiley cuffed 10/17 xlt cuffed distal>>> 10/22- Trach changed to #8 Shiley cuffed from distal due to airway irritation and bleeding per ENT 10/23 - Bronch for continued bloody discharge and shortness of breath 10/23 - Trach changed to XLT #6  LINES / TUBES: ETT 10/7 >> 10/10 Trach 10/10 (Bates)>>  Trach 10/15 = XLT #6 >>  CULTURES: 10/7 Urine >>> 25k CFU/mL yeast Sputum 10/16>>>Few Gram positive rods> nl flora Blood x 2  10/22 >> negative  ANTIBIOTICS: Levaquin 10/7 >>> 10/7 Diflucan 10/21>>> 10/28   SUBJECTIVE/overnight:     VITAL SIGNS: Temp:  [97.8 F (36.6 C)-98.5 F (36.9 C)] 98.5 F (36.9 C) (10/28 0744) Pulse Rate:  [58-79] 64 (10/28 0801) Resp:  [13-31] 19 (10/28 0801) BP: (101-123)/(53-75) 118/68 mmHg (10/28 0801) SpO2:  [95 %-100 %] 100 % (10/28 0801) FiO2 (%):   [40 %-100 %] 40 % (10/28 0801)  VENTILATOR SETTINGS: Vent Mode:  [-] PRVC FiO2 (%):  [40 %-100 %] 40 % Set Rate:  [14 bmp] 14 bmp Vt Set:  [450 mL] 450 mL PEEP:  [5 cmH20] 5 cmH20  INTAKE / OUTPUT:  Intake/Output Summary (Last 24 hours) at 06/10/13 1131 Last data filed at 06/10/13 1914  Gross per 24 hour  Intake      0 ml  Output   2150 ml  Net  -2150 ml   PHYSICAL EXAMINATION: General:  In chair, resting comfortably Neuro:  A/O x 3 , moves all 4, follows commands HEENT:  Trach #6 XLT, JVD, decreased bloody discharge seen in suction tubing. Cardiovascular: Tachycardia,  2-3/6 murmur heard best at 4ICS nipple line Lungs:  Min BR bloody secretions from trach, CTA, no noted wheeze Abdomen:  bowel sounds present, tolerating tube feeds.  Musculoskeletal:  No gross deformities, moves extremities x 4  LABS:  CHEMISTRY  Recent Labs Lab 06/05/13 0500 06/06/13 0550 06/06/13 1510 06/09/13 0515 06/10/13 0440  NA 143 141 139 143 140  K 4.4 4.9 5.2* 4.8 4.1  CL 99 97 97 100 102  CO2 35* 34* 30 33* 24  GLUCOSE 180* 158* 103* 100* 136*  BUN 72* 79* 81* 97* 24*  CREATININE 2.10* 2.26* 2.22* 1.99* 1.46*  CALCIUM 8.9 9.0 8.8 9.0 8.7  MG 2.7* 2.6*  --   --   --   PHOS 4.1 5.3*  --   --   --    Estimated Creatinine  Clearance: 72 ml/min (by C-G formula based on Cr of 1.46).   PCXR:  10/27 Minimal bibasilar atelectasis    ASSESSMENT / PLAN:  PULMONARY A:   Acute on chronic respiratory failure - s/p Trach 10/10  Chronic restrictive pulmonary disease - hx of chronic pleural thickening   OSA/OHS - S/p trach  Airway Trauma/ Bloody secretions on plavix/asa  Wheezing/ airtrapping noted am 10/25   P:   - Racemic epi treatments for bleeding per ENT - Gentle saline to help with coughing per ENT - Avoid un-necessary suctioning beyond trach length. -Trach collar during day, home vent at night - Trach dependent indefinitely - BDs as ordered - Low dose prednisone Day 3/5 -  supervised use of speech valve   CARDIOVASCULAR A:  CAD s/p CABG.   Chronic diastolic CHF - without frank exacerbation  Transiently hypotensive - BP now improved Tachycardia  P:  - Monitor tachycardia  RENAL A:   Acute on chronic renal failure  Hypernatremia - resolved 10/23 Hyperkalemia-resolved 10/22 Lab Results  Component Value Date   CREATININE 1.46* 06/10/2013   CREATININE 1.99* 06/09/2013   CREATININE 2.22* 06/06/2013    P:   - Trend BMP, monitor K  HEMATOLOGIC A:   Anemia - chronic likely related to multiple chronic illness. Hb stable 8-9. Thrombocytopenia - resolved 10/23  P:  - Intermittently check cbc - SCDs - Cont Asa/plavix therapy.  Consider d/c if bleeding continues/worsens  ENDOCRINE  A:   DM2 - diet controlled ( tube feeds at goal) P:   - CBGs q 8 hrs - SSI if glu > 180  - d/c tube feeds - Dysphagia 2 diet with direct supervision of all substances PO  Infectious: A:  Thrush P: - Diflucan Day 7/7  NEURO/RHEUM A:   Acute encephalopathy - resolved Pain due to ETT - improving  Acute Gout Flare -resolved P:   - PRN analgesia - Mobilize per PT as able.  Carley Hammed Physician Assistant Student 06/10/13, 11:45 am  Patient doing well on trach collar at this time.  Has progressed to directly observed PO diet and use of  Speech valve.  Will continue to use home vent settings at night time.  Will d/c diflucan today as full course of seven days completed.  Will continue the use of prednisone for 5 days.  Continue with current suction protocol.     Patient seen and examined, agree with above note.  I dictated the care and orders written for this patient under my direction.  Alyson Reedy, MD [phone]

## 2013-06-10 NOTE — Progress Notes (Signed)
Physical Therapy Treatment Patient Details Name: [name] [name] MRN: [id number] DOB: 1951/04/07 Today's Date: 06/10/2013 Time: 4098-1191 PT Time Calculation (min): 35 min  PT Assessment / Plan / Recommendation  History of Present Illness 62 yo with past medical history of chronic severe restrictive pulmonary secondary to obesity and pleural fibrosis (previously reported as COPD), OSA with poor BiPAP compliance at home, OHS and recent intubation for acute respiratory failure brought to ED with progressive dyspnea and acute encephalopathy.  In ED intubated for airway protection. Trach placed 10/10 and pt now on trach collar.   PT Comments   Pt continues to progress well with mobility and gait. Pt with increased ambulation distance today and overall improved function. Pt with great attitude, eager to go home and encouraged to continue HEP.   Follow Up Recommendations  Home health PT;Supervision - Intermittent     Does the patient have the potential to tolerate intense rehabilitation     Barriers to Discharge        Equipment Recommendations       Recommendations for Other Services    Frequency     Progress towards PT Goals Progress towards PT goals: Goals met and updated - see care plan  Plan Current plan remains appropriate    Precautions / Restrictions Precautions Precautions: Fall Precaution Comments: trach collar   Pertinent Vitals/Pain No pain sats maintained in mid 90s throughout on 35%    Mobility  Bed Mobility Bed Mobility: Not assessed Transfers Sit to Stand: 6: Modified independent (Device/Increase time);From chair/3-in-1;With armrests Stand to Sit: 6: Modified independent (Device/Increase time);To chair/3-in-1;With armrests Ambulation/Gait Ambulation/Gait Assistance: 5: Supervision Ambulation Distance (Feet): 600 Feet Assistive device: Rolling walker Ambulation/Gait Assistance Details: cues for posture and position in RW with two standing rest breaks, slow  steady gait Gait Pattern: Step-through pattern;Decreased stride length Gait velocity: decreased Stairs: No    Exercises General Exercises - Lower Extremity Long Arc Quad: AROM;Right;Left;20 reps;Other reps (comment);Seated (40 reps on RLE, 20 reps LLE) Hip ABduction/ADduction: AROM;Seated;Both;20 reps Hip Flexion/Marching: AROM;Both;20 reps;Seated Toe Raises: AROM;Both;Seated;20 reps Heel Raises: AROM;Both;20 reps;Seated   PT Diagnosis:    PT Problem List:   PT Treatment Interventions:     PT Goals (current goals can now be found in the care plan section)    Visit Information  Last PT Received On: 06/10/13 Assistance Needed: +1 History of Present Illness: 62 yo with past medical history of chronic severe restrictive pulmonary secondary to obesity and pleural fibrosis (previously reported as COPD), OSA with poor BiPAP compliance at home, OHS and recent intubation for acute respiratory failure brought to ED with progressive dyspnea and acute encephalopathy.  In ED intubated for airway protection. Trach placed 10/10 and pt now on trach collar.    Subjective Data      Cognition  Cognition Arousal/Alertness: Awake/alert Behavior During Therapy: WFL for tasks assessed/performed Overall Cognitive Status: Within Functional Limits for tasks assessed    Balance     End of Session PT - End of Session Equipment Utilized During Treatment: Oxygen Activity Tolerance: Patient tolerated treatment well Patient left: in chair;with call bell/phone within reach;with family/visitor present Nurse Communication: Mobility status   GP     [name] 06/10/2013, 2:10 PM [name], PT [phone]

## 2013-06-11 LAB — BASIC METABOLIC PANEL
BUN: 67 mg/dL — ABNORMAL HIGH (ref 6–23)
Calcium: 9.1 mg/dL (ref 8.4–10.5)
Creatinine, Ser: 1.75 mg/dL — ABNORMAL HIGH (ref 0.50–1.35)
GFR calc Af Amer: 47 mL/min — ABNORMAL LOW (ref >90)
GFR calc non Af Amer: 40 mL/min — ABNORMAL LOW (ref >90)
Glucose, Bld: 188 mg/dL — ABNORMAL HIGH (ref 70–99)
Sodium: 136 mEq/L (ref 135–145)

## 2013-06-11 LAB — CULTURE, BLOOD (ROUTINE X 2): Culture: NO GROWTH

## 2013-06-11 LAB — GLUCOSE, CAPILLARY

## 2013-06-11 NOTE — Progress Notes (Signed)
Trach education started with pt and wife. Wife seems to be nervous and needs further education on trach care and suctioning before discharging pt home. Spoke with RN and RT for pt today and plan is for wife to do all suctioning and trach care when she is present with staff observing for wife to become more comfortable. Will follow back up in am with wife.

## 2013-06-11 NOTE — Progress Notes (Signed)
Spoke with pt regarding home ventilator, as it was not present in room. Pt stated wife and home health took ventilator home to prepare for him going home. Pt also stated that wife was not coming back to stay with him tonight. RT will place pt on our vent and monitor through the night.

## 2013-06-11 NOTE — Progress Notes (Signed)
PULMONARY  / CRITICAL CARE MEDICINE  Name: [name] [name] MRN: [id number] DOB: [birth year]    ADMISSION DATE:  05/20/2013 CONSULTATION DATE:  05/20/2013  REFERRING MD :  EDP PRIMARY SERVICE:  PCCM  CHIEF COMPLAINT:  Dyspnea  BRIEF PATIENT DESCRIPTION: 62 y/o with past medical history of chronic severe restrictive pulmonary secondary to obesity and pleural fibrosis (previously reported as COPD), OSA with poor BiPAP compliance at home, OHS and recent intubation for acute respiratory failure brought to ED with progressive dyspnea and acute encephalopathy.  In ED intubated for airway protection.  PCCM was consulted.  SIGNIFICANT EVENTS / STUDIES:  10/07 - early AM 10/8 - intubated in the ED, admitted to ICU 10/08 - stable on vent, 2D echo >>> LA and LV mildly dilated, mild LVH, EF 55-60%, grade 1 diastolic dysfunction, septum with abnormal function/dyssynergy, mild AS, calcified mitral annulus 10/09 - urinary retention overnight, Foley replaced; ENT consult for tracheostomy eval (Dr. [name]) 10/10 - to OR for trach 10/13- did not tolerate PMV well 10/15- acidotic  At 5 am abg 10/16- Trach change to 6 Shiley cuffed 10/17 xlt cuffed distal>>> 10/22- Trach changed to #8 Shiley cuffed from distal due to airway irritation and bleeding per ENT 10/23 - Bronch for continued bloody discharge and shortness of breath 10/23 - Trach changed to XLT #6  LINES / TUBES: ETT 10/7 >> 10/10 Trach 10/10 ([facility]ates)>>  Trach 10/15 = XLT #6 >>  CULTURES: 10/7 Urine >>> 25k CFU/mL yeast Sputum 10/16>>>Few Gram positive rods> nl flora Blood x 2  10/22 >> negative  ANTIBIOTICS: Levaquin 10/7 >>> 10/7 Diflucan 10/21>>> 10/28   SUBJECTIVE/overnight:     VITAL SIGNS: Temp:  [98.4 F (36.9 C)-98.6 F (37 C)] 98.4 F (36.9 C) (10/29 0815) Pulse Rate:  [59-92] 61 (10/29 0815) Resp:  [14-25] 24 (10/29 0815) BP: (112-188)/(56-88) 149/69 mmHg (10/29 0815) SpO2:  [98 %-100 %] 100 % (10/29 0815) FiO2 (%):   [35 %-40 %] 35 % (10/29 0815)  VENTILATOR SETTINGS: Vent Mode:  [-]  FiO2 (%):  [35 %-40 %] 35 % Set Rate:  [14 bmp] 14 bmp Vt Set:  [450 mL] 450 mL PEEP:  [5 cmH20] 5 cmH20  INTAKE / OUTPUT:  Intake/Output Summary (Last 24 hours) at 06/11/13 1125 Last data filed at 06/11/13 0831  Gross per 24 hour  Intake     60 ml  Output   2250 ml  Net  -2190 ml   PHYSICAL EXAMINATION: General:  In chair, resting comfortably Neuro:  A/O x 3 , moves all 4, follows commands HEENT:  Trach #6 XLT, JVD, decreased bloody discharge seen in suction tubing. Cardiovascular: Tachycardia,  2-3/6 murmur heard best at 4ICS nipple line Lungs: CTA, no noted wheeze, on trach collar, with speech valve Abdomen:  bowel sounds present, soft and nontender.  Musculoskeletal:  No gross deformities, moves extremities x 4  LABS:  CHEMISTRY  Recent Labs Lab 06/05/13 0500 06/06/13 0550 06/06/13 1510 06/09/13 0515 06/10/13 0440 06/10/13 1317  NA 143 141 139 143 140 140  K 4.4 4.9 5.2* 4.8 4.1 5.0  CL 99 97 97 100 102 99  CO2 35* 34* 30 33* 24 30  GLUCOSE 180* 158* 103* 100* 136* 167*  BUN 72* 79* 81* 97* 24* 84*  CREATININE 2.10* 2.26* 2.22* 1.99* 1.46* 1.80*  CALCIUM 8.9 9.0 8.8 9.0 8.7 9.1  MG 2.7* 2.6*  --   --   --   --   PHOS 4.1 5.3*  --   --   --   --  Estimated Creatinine Clearance: 58.4 ml/min (by C-G formula based on Cr of 1.8).   PCXR:  NNF  ASSESSMENT / PLAN:  PULMONARY A:   Acute on chronic respiratory failure - s/p Trach 10/10  Chronic restrictive pulmonary disease - hx of chronic pleural thickening   OSA/OHS - S/p trach  Airway Trauma/ Bloody secretions on plavix/asa  Wheezing/ airtrapping noted am 10/25   P:   - Avoid un-necessary suctioning beyond trach length. -Trach collar during day, home vent at night - Trach dependent indefinitely - BDs as ordered - Low dose prednisone Day 4/5 - supervised use of speech valve   CARDIOVASCULAR A:  CAD s/p CABG.   Chronic  diastolic CHF - without frank exacerbation  Transiently hypotensive - BP now improved Tachycardia  P:  - Monitor tachycardia  RENAL A:   Acute on chronic renal failure  Hypernatremia - resolved 10/23 Hyperkalemia-resolved 10/22 Lab Results  Component Value Date   CREATININE 1.80* 06/10/2013   CREATININE 1.46* 06/10/2013   CREATININE 1.99* 06/09/2013    P:   - Trend BMP, monitor K  HEMATOLOGIC A:   Anemia - chronic likely related to multiple chronic illness. Hb stable 8-9. Thrombocytopenia - resolved 10/23  P:  - Intermittently check cbc - SCDs - Cont Asa/plavix therapy.  Consider d/c if bleeding continues/worsens  ENDOCRINE  A:   DM2 - diet controlled  P:   - CBGs q 8 hrs - SSI if glu > 180  - advanced to diet  Infectious: A:  Thrush resolved No acute issues P: -monitor fever curve  NEURO/RHEUM A:   Acute encephalopathy - resolved Pain due to ETT - improving  Acute Gout Flare -resolved P:   - PRN analgesia - Mobilize per PT as able.  Carley Hammed Physician Assistant Student 06/11/13, 11:29 am  Patient doing well.  Is ready to go home, pt and wife need further training on vent and trach care before he can be safely discharged.  Potassium elevated yesterday at 5.0 today and SCr and BUN slightly bumped up.  Will follow today on afternoon Bmet.  GFR remains relatively stable. Will dc low dose prednisone tomorrow.       Patient seen and examined, agree with above note.  I dictated the care and orders written for this patient under my direction.  Alyson Reedy, MD [phone]

## 2013-06-12 ENCOUNTER — Telehealth: Payer: Self-pay | Admitting: Pulmonary Disease

## 2013-06-12 DIAGNOSIS — D696 Thrombocytopenia, unspecified: Secondary | ICD-10-CM

## 2013-06-12 DIAGNOSIS — N179 Acute kidney failure, unspecified: Secondary | ICD-10-CM

## 2013-06-12 DIAGNOSIS — J9610 Chronic respiratory failure, unspecified whether with hypoxia or hypercapnia: Secondary | ICD-10-CM

## 2013-06-12 LAB — GLUCOSE, CAPILLARY
Glucose-Capillary: 100 mg/dL — ABNORMAL HIGH (ref 70–99)
Glucose-Capillary: 92 mg/dL (ref 70–99)
Glucose-Capillary: 102 mg/dL — ABNORMAL HIGH (ref 70–99)

## 2013-06-12 MED ORDER — CLOPIDOGREL BISULFATE 75 MG PO TABS: 75.0000 mg | ORAL_TABLET | Freq: Every day | ORAL | Status: DC

## 2013-06-12 MED ORDER — ASPIRIN 81 MG PO CHEW: 81.0000 mg | CHEWABLE_TABLET | Freq: Every day | ORAL | Status: DC

## 2013-06-12 MED ORDER — FAMOTIDINE 40 MG/5ML PO SUSR: 20.0000 mg | Freq: Two times a day (BID) | ORAL | Status: DC

## 2013-06-12 MED ORDER — TRAMADOL HCL 50 MG PO TABS: 50.0000 mg | ORAL_TABLET | Freq: Four times a day (QID) | ORAL | Status: DC | PRN

## 2013-06-12 MED ORDER — GLUCERNA SHAKE PO LIQD: 237.0000 mL | Freq: Two times a day (BID) | ORAL | Status: DC

## 2013-06-12 MED ORDER — ARFORMOTEROL TARTRATE 15 MCG/2ML IN NEBU: 15.0000 ug | INHALATION_SOLUTION | Freq: Two times a day (BID) | RESPIRATORY_TRACT | Status: DC

## 2013-06-12 MED ORDER — ACETAMINOPHEN 160 MG/5ML PO SOLN: 650.0000 mg | ORAL | Status: DC | PRN

## 2013-06-12 MED ORDER — FUROSEMIDE 20 MG PO TABS: 20.0000 mg | ORAL_TABLET | Freq: Every day | ORAL | Status: DC

## 2013-06-12 MED ORDER — FUROSEMIDE 20 MG PO TABS
20.0000 mg | ORAL_TABLET | Freq: Every day | ORAL | Status: DC
  Administered 2013-06-12: 20 mg via ORAL
  Filled 2013-06-12: qty 1

## 2013-06-12 MED ORDER — ALBUTEROL SULFATE (2.5 MG/3ML) 0.083% IN NEBU: 2.5000 mg | INHALATION_SOLUTION | RESPIRATORY_TRACT | Status: DC | PRN

## 2013-06-12 MED ORDER — ATORVASTATIN CALCIUM 40 MG PO TABS: 40.0000 mg | ORAL_TABLET | Freq: Every day | ORAL | Status: DC

## 2013-06-12 MED ORDER — BUDESONIDE 0.5 MG/2ML IN SUSP: 0.5000 mg | Freq: Two times a day (BID) | RESPIRATORY_TRACT | Status: DC

## 2013-06-12 MED ORDER — INSULIN ASPART 100 UNIT/ML ~~LOC~~ SOLN: 0.0000 [IU] | Freq: Three times a day (TID) | SUBCUTANEOUS | Status: DC

## 2013-06-12 NOTE — Progress Notes (Signed)
F/u w/ wife for continued trach education.  Wife performed trach care, suctioned pt, took on /off oxygen equipment for pt.  Wife was able to adequately trouble shoot when pt started coughing/gagging during trach care and suctioning.  Reviewed w/ wife signs of trach/lung infection, emergency management, trach supplies, cleaning supplies, pt bathing w/ trach and getting dressed.  Also, wife given information re: [facility] trach clinic info.    Noted: a clear suture under trach.  I discussed this w/ PA Toni Amend) she will discuss this w/ Dr. Molli Knock.    Answered all questions, wife and pt state they feel comfortable w/ trach education and trach care.  Home health came in and said pt will have a home health RN to go out to pt house to help w/ trach care also.

## 2013-06-12 NOTE — Progress Notes (Signed)
D/c instructions reviewed and given to patient. All questions answered and pt verbalized understanding.

## 2013-06-12 NOTE — Progress Notes (Signed)
Came to bedside to continue trach education w/ patient and wife.  Wife is not present, pt states he is not sure when she will be in today.  I spoke w/ RN re: this, and a RT will check back later today to continue trach education f/u.

## 2013-06-12 NOTE — Progress Notes (Signed)
[facility] called to transport pt home 

## 2013-06-12 NOTE — Progress Notes (Signed)
PULMONARY  / CRITICAL CARE MEDICINE  Name: [name] [name] MRN: [id number] DOB: 30-Jul-1951    ADMISSION DATE:  05/20/2013 CONSULTATION DATE:  05/20/2013  REFERRING MD :  EDP PRIMARY SERVICE:  PCCM  CHIEF COMPLAINT:  Dyspnea  BRIEF PATIENT DESCRIPTION: 62 y/o with past medical history of chronic severe restrictive pulmonary secondary to obesity and pleural fibrosis (previously reported as COPD), OSA with poor BiPAP compliance at home, OHS and recent intubation for acute respiratory failure brought to ED with progressive dyspnea and acute encephalopathy.  In ED intubated for airway protection.  PCCM was consulted.  SIGNIFICANT EVENTS / STUDIES:  10/07 - early AM 10/8 - intubated in the ED, admitted to ICU 10/08 - stable on vent, 2D echo >>> LA and LV mildly dilated, mild LVH, EF 55-60%, grade 1 diastolic dysfunction, septum with abnormal function/dyssynergy, mild AS, calcified mitral annulus 10/09 - urinary retention overnight, Foley replaced; ENT consult for tracheostomy eval (Dr. [name]) 10/10 - to OR for trach 10/13- did not tolerate PMV well 10/15- acidotic  At 5 am abg 10/16- Trach change to 6 Shiley cuffed 10/17 xlt cuffed distal>>> 10/22- Trach changed to #8 Shiley cuffed from distal due to airway irritation and bleeding per ENT 10/23 - Bronch for continued bloody discharge and shortness of breath 10/23 - Trach changed to XLT #6  LINES / TUBES: ETT 10/7 >> 10/10 Trach 10/10 (Bates)>>  Trach 10/15 = XLT #6 >>  CULTURES: 10/7 Urine >>> 25k CFU/mL yeast Sputum 10/16>>>Few Gram positive rods> nl flora Blood x 2  10/22 >> negative  ANTIBIOTICS: Levaquin 10/7 >>> 10/7 Diflucan 10/21>>> 10/28   SUBJECTIVE/overnight:   No acute change.  Wife needing more trach education per RT.   VITAL SIGNS: Temp:  [98 F (36.7 C)-98.6 F (37 C)] 98.4 F (36.9 C) (10/30 0714) Pulse Rate:  [59-75] 69 (10/30 0714) Resp:  [15-32] 29 (10/30 0714) BP: (115-165)/(68-111) 115/68 mmHg (10/30  0714) SpO2:  [83 %-100 %] 100 % (10/30 0714) FiO2 (%):  [35 %-40 %] 35 % (10/30 0714)  VENTILATOR SETTINGS: Vent Mode:  [-] PRVC FiO2 (%):  [35 %-40 %] 35 % Set Rate:  [14 bmp] 14 bmp Vt Set:  [450 mL] 450 mL PEEP:  [5 cmH20] 5 cmH20  INTAKE / OUTPUT:  Intake/Output Summary (Last 24 hours) at 06/12/13 0935 Last data filed at 06/12/13 0755  Gross per 24 hour  Intake    120 ml  Output   2301 ml  Net  -2181 ml   PHYSICAL EXAMINATION: General:  In chair, resting comfortably Neuro:  A/O x 3 , moves all 4, follows commands HEENT:  Trach #6 XLT, JVD, decreased bloody discharge seen in suction tubing. Cardiovascular: Tachycardia,  2-3/6 murmur heard best at 4ICS nipple line Lungs: CTA, no noted wheeze, on trach collar, with speech valve Abdomen:  bowel sounds present, soft and nontender.  Musculoskeletal:  No gross deformities, moves extremities x 4  LABS:  CHEMISTRY  Recent Labs Lab 06/06/13 0550 06/06/13 1510 06/09/13 0515 06/10/13 0440 06/10/13 1317 06/11/13 1405  NA 141 139 143 140 140 136  K 4.9 5.2* 4.8 4.1 5.0 5.5*  CL 97 97 100 102 99 95*  CO2 34* 30 33* 24 30 34*  GLUCOSE 158* 103* 100* 136* 167* 188*  BUN 79* 81* 97* 24* 84* 67*  CREATININE 2.26* 2.22* 1.99* 1.46* 1.80* 1.75*  CALCIUM 9.0 8.8 9.0 8.7 9.1 9.1  MG 2.6*  --   --   --   --   --  PHOS 5.3*  --   --   --   --   --    Estimated Creatinine Clearance: 60.1 ml/min (by C-G formula based on Cr of 1.75).  No results found.   ASSESSMENT / PLAN:  PULMONARY A:   Acute on chronic respiratory failure - s/p Trach 10/10  Chronic restrictive pulmonary disease - hx of chronic pleural thickening   OSA/OHS - S/p trach  Airway Trauma/ Bloody secretions on plavix/asa  Wheezing/ airtrapping noted am 10/25   P:   - Avoid un-necessary suctioning beyond trach length. -Trach collar during day, home vent at night - Trach dependent indefinitely - BDs as ordered - Low dose prednisone Day 4/5 - supervised use  of speech valve   CARDIOVASCULAR A:  CAD s/p CABG.   Chronic diastolic CHF - without frank exacerbation  Transiently hypotensive - BP now improved Tachycardia  P:  - Monitor tachycardia  RENAL A:   Acute on chronic renal failure  Hypernatremia - resolved 10/23 Hyperkalemia-resolved 10/22 Lab Results  Component Value Date   CREATININE 1.75* 06/11/2013   CREATININE 1.80* 06/10/2013   CREATININE 1.46* 06/10/2013    P:   - Trend BMP, monitor K  HEMATOLOGIC A:   Anemia - chronic likely related to multiple chronic illness. Hb stable 8-9. Thrombocytopenia - resolved 10/23  P:  - Intermittently check cbc - SCDs - Cont Asa/plavix therapy.  Consider d/c if bleeding continues/worsens  ENDOCRINE  A:   DM2 - diet controlled  P:   - CBGs q 8 hrs - SSI if glu > 180  - advanced to diet  Infectious: A:  Thrush resolved No acute issues P: -monitor fever curve  NEURO/RHEUM A:   Acute encephalopathy - resolved Pain due to ETT - improving  Acute Gout Flare -resolved P:   - PRN analgesia - Mobilize per PT as able.   Patient doing well.  Is ready to go home, pt and wife need further training on vent and trach care before he can be safely discharged.  Potassium elevated yesterday at 5.0 today and SCr and BUN slightly bumped up.  Will follow today on afternoon Bmet.  GFR remains relatively stable. Will dc low dose prednisone tomorrow.       Danford Bad, NP 06/12/2013  9:36 AM Pager: [phone] or [phone]  *Care during the described time interval was provided by me and/or other providers on the critical care team. I have reviewed this patient's available data, including medical history, events of note, physical examination and test results as part of my evaluation.  Alyson Reedy, M.D. [facility] Pulmonary/Critical Care Medicine. Pager: [phone]. After hours pager: [phone].

## 2013-06-12 NOTE — Clinical Social Work Note (Addendum)
CSW has arranged for transportation via [facility] from [facility] to his home. CSW has alerted pt's nurse. CSW signing off.  ADDENDUM: [facility] cancelled because discharge paperwork not complete. CSW alerted RNCM, who alerted the appropriate provider to fill out discharge paperwork. CSW printed medical necessity form and left this with RNCM. Pt's nurse also alerted transportation was cancelled. Pt informed that paperwork is being completed and when it is finished he will be discharged. RNCM or pt's nurse will call to arrange transportation.  Maryclare Labrador, MSW, [facility] Clinical Social Worker [phone]

## 2013-06-12 NOTE — Discharge Summary (Signed)
Physician Discharge Summary  Patient ID: [name] [name] MRN: [id number] DOB/AGE: 1950-11-07 62 y.o.  Admit date: 05/20/2013 Discharge date: 06/12/2013    Discharge Diagnoses:  Principal Problem:   Acute-on-chronic respiratory failure Active Problems:   Chronic diastolic CHF (congestive heart failure)   C O P D   RESPIRATORY FAILURE, CHRONIC, on BiPap   Obstructive sleep apnea   PVD (peripheral vascular disease)   Chronic renal insufficiency, stage III (moderate)   Type 2 diabetes mellitus with vascular disease   Hypertensive heart disease   Anemia, past Hx GI bleed 5/12   Encephalopathy acute   Respiratory acidosis   Gout flare   Tracheostomy status   Thrombocytopenia, unspecified   Acute on chronic renal failure    Brief Summary: [name] [name] is a 62 y.o. y/o male with a PMH of severe restrictive pulmonary disease r/t obesity and pleural fibrosis (previously reported as COPD), OSA/OHS with poor bipap compliance.  Presented 05/20/13 with progressive dyspnea and acute encephalopathy r/t acute on chronic respiratory failure in setting of above. He was intubated for airway protection in ER.  Therapeutic interventions included sedation protocol, mechanical ventilation and supportive care. He was treated briefly with empiric abx but had no evidence of infection and these were stopped.  Due to poor vent weaning and repeated acute decompensation of chronic hypercarbic respiratory failure, decision was made for early trach for long term management of his pulmonary disease.  He underwent tracheostomy  10/10 by ENT and was quickly able to tolerate ATC, but did become hypercarbic after several days of continuous ATC, and it was determined that he will need long term qhs vent.  He passed swallow eval and is tolerating diet and meds by mouth.   He had an episode of trach bleeding on 10/22 on asa, plavix.  Plavix was held and ENT performed tracheoscopy and trach change to #8 shiley.  He is  now tolerating #6XLT trach.  He had additional episode of mild trach bleeding and wheezing 10/25 and was treated with racemic epi neb and low dose prednisone x 5 days which is complete with no further bleeding and no further noted bronchospasm.  His course was additionally complicated by: acute on chronic renal failure that responded to IVFs and stopping diuretics. From this point on his care was focused on vent weaning, vent teaching, trach care teaching, and working of advancing diet.  Pt and family have received extensive teaching regarding trach care and vent management and he is ready for d/c to home with trach and qhs vent.   SIGNIFICANT EVENTS / STUDIES:  10/07 - early AM 10/8 - intubated in the ED, admitted to ICU  10/08 - stable on vent, 2D echo >>> LA and LV mildly dilated, mild LVH, EF 55-60%, grade 1 diastolic dysfunction, septum with abnormal function/dyssynergy, mild AS, calcified mitral annulus  10/09 - urinary retention overnight, Foley replaced; ENT consult for tracheostomy eval (Dr. [name])  10/10 - to OR for trach 10/13- did not tolerate PMV well  10/15- acidotic At 5 am abg  10/16- Trach change to 6 Shiley cuffed  10/17  xlt cuffed distal>>>  10/22- Trach changed to #8 Shiley cuffed from distal due to airway irritation and bleeding per ENT  10/23 - Bronch for continued bloody discharge and shortness of breath  10/23 - Trach changed to XLT #6  10/25 - mld trach bleeding, wheezing   LINES / TUBES:  ETT 10/7 >> 10/10  Trach 10/10 ([place])>>  Trach 10/15 =  XLT #6 >>   CULTURES:  10/7 Urine >>> 25k CFU/mL yeast  Sputum 10/16>>>Few Gram positive rods> nl flora  Blood x 2 10/22 >> negative   ANTIBIOTICS:  Levaquin 10/7 >>> 10/7  Diflucan 10/21>>> 10/28                                                                    Discharge Plan by Discharge Diagnosis  Acute on chronic respiratory failure - s/p Trach 10/10  Chronic restrictive pulmonary disease - hx of chronic pleural  thickening  OSA/OHS - S/p trach 10/10 [name] - ENT) Airway Trauma/ Bloody secretions on plavix/asa  Wheezing/ airtrapping noted am 10/25  PLAN -  - daytime ATC, qhs vent >> PRVC 14/450/5/40% - Avoid un-necessary suctioning beyond trach length.  - Trach dependent indefinitely  - BDs as ordered  - supervised use of speech valve - outpt pulmonary f/u  ** He will f/u with Rubye Oaks NP for immediate post hospital f/u then will transition over to Dr. Elly Modena for further mgmt.  - outpt ENT f/u for trach mgmt   CAD s/p CABG.  Chronic diastolic CHF - without frank exacerbation  Transiently hypotensive - BP now improved  Tachycardia  PLAN -  - plavix held r/t repeated issues with trach site bleeding  - low dose daily lasix  - will d/c off home isosorbide, metoprolol with relative bradycardia  - outpt cards f/u as below   Anemia - chronic likely related to multiple chronic illness. Hb stable 8-9.  Thrombocytopenia - resolved 10/23  PLAN -  - cont off plavix for now  - outpt f/u CBC  DM2 - diet controlled  PLAN -  - outpt f/u   Acute encephalopathy - resolved   Acute Gout Flare -resolved  P:  - PRN analgesia  - Mobilize as able   Filed Vitals:   06/12/13 0800 06/12/13 1000 06/12/13 1106 06/12/13 1200  BP: 129/67 122/74 122/74 130/81  Pulse: 63 71 70 111  Temp:   98.2 F (36.8 C)   TempSrc:   Oral   Resp: 15 23 22    Height:      Weight:      SpO2: 100% 96% 94% 100%     Discharge Labs  BMET  Recent Labs Lab 06/06/13 0550 06/06/13 1510 06/09/13 0515 06/10/13 0440 06/10/13 1317 06/11/13 1405  NA 141 139 143 140 140 136  K 4.9 5.2* 4.8 4.1 5.0 5.5*  CL 97 97 100 102 99 95*  CO2 34* 30 33* 24 30 34*  GLUCOSE 158* 103* 100* 136* 167* 188*  BUN 79* 81* 97* 24* 84* 67*  CREATININE 2.26* 2.22* 1.99* 1.46* 1.80* 1.75*  CALCIUM 9.0 8.8 9.0 8.7 9.1 9.1  MG 2.6*  --   --   --   --   --   PHOS 5.3*  --   --   --   --   --      CBC   Recent Labs Lab  06/06/13 0550 06/07/13 0550 06/09/13 0515  HGB 8.2* 7.2* 8.0*  HCT 29.2* 25.8* 28.5*  WBC 12.0* 9.3 9.9  PLT 232 239 274   Anti-Coagulation No results found for this basename: INR,  in the last 168 hours  Future Appointments Provider Department Dept Phone   06/23/2013 2:15 PM Julio Sicks, NP [facility] [phone]   06/27/2013 9:15 AM Runell Gess, MD [facility] [phone]           Follow-up Information   Follow up with Christia Reading, MD On 06/26/2013. (1:50pm )    Specialty:  Otolaryngology   Contact information:   [address] [place] [address] [phone]       Follow up with [facility], NP On 06/23/2013. (2:15pm )    Specialty:  Nurse Practitioner   Contact information:   [address] [place] [address] [phone]       Follow up with Runell Gess, MD On 06/27/2013. (9:15am )    Specialty:  Cardiology   Contact information:   [address] [place] [address] [phone]          Medication List    STOP taking these medications       aspirin EC 81 MG tablet  Replaced by:  aspirin 81 MG chewable tablet     budesonide 0.25 MG/2ML nebulizer solution  Commonly known as:  PULMICORT  Replaced by:  budesonide 0.5 MG/2ML nebulizer solution     CENTRUM SILVER PO     COLCRYS 0.6 MG tablet  Generic drug:  colchicine     isosorbide mononitrate 60 MG 24 hr tablet  Commonly known as:  IMDUR     metoprolol tartrate 25 MG tablet  Commonly known as:  LOPRESSOR     mometasone 50 MCG/ACT nasal spray  Commonly known as:  NASONEX     pantoprazole 40 MG tablet  Commonly known as:  PROTONIX     rosuvastatin 10 MG tablet  Commonly known as:  CRESTOR     tamsulosin 0.4 MG Caps capsule  Commonly known as:  FLOMAX     tiotropium 18 MCG inhalation capsule  Commonly known as:  SPIRIVA      TAKE these medications       acetaminophen 160 MG/5ML solution   Commonly known as:  TYLENOL  Take 20.3 mLs (650 mg total) by mouth every 4 (four) hours as needed for fever.     albuterol (2.5 MG/3ML) 0.083% nebulizer solution  Commonly known as:  PROVENTIL  Take 3 mLs (2.5 mg total) by nebulization every 4 (four) hours as needed for wheezing.     arformoterol 15 MCG/2ML Nebu  Commonly known as:  BROVANA  Take 2 mLs (15 mcg total) by nebulization 2 (two) times daily.     aspirin 81 MG chewable tablet  Chew 1 tablet (81 mg total) by mouth daily.     atorvastatin 40 MG tablet  Commonly known as:  LIPITOR  Take 1 tablet (40 mg total) by mouth daily at 6 PM.     budesonide 0.5 MG/2ML nebulizer solution  Commonly known as:  PULMICORT  Take 2 mLs (0.5 mg total) by nebulization 2 (two) times daily.     clopidogrel 75 MG tablet  Commonly known as:  PLAVIX  Take 1 tablet (75 mg total) by mouth daily.     famotidine 40 MG/5ML suspension  Commonly known as:  PEPCID  Take 2.5 mLs (20 mg total) by mouth 2 (two) times daily.     feeding supplement (GLUCERNA SHAKE) Liqd  Take 237 mLs by mouth 2 (two) times daily between meals.     furosemide 20 MG tablet  Commonly known as:  LASIX  Take 1 tablet (20 mg total) by mouth daily.     nitroGLYCERIN 0.4 MG SL tablet  Commonly known as:  NITROSTAT  Place 1 tablet (0.4 mg total) under the tongue every 5 (five) minutes as needed. For chest pain     traMADol 50 MG tablet  Commonly known as:  ULTRAM  Take 1 tablet (50 mg total) by mouth every 6 (six) hours as needed for pain.          Disposition: 06-Home-Health Care Svc  Discharged Condition: [name] [name] has met maximum benefit of inpatient care and is medically stable and cleared for discharge.  Patient is pending follow up as above.      Time spent on disposition:  Greater than 35 minutes.   SignedDanford Bad, NP 06/12/2013  2:20 PM Pager: [phone] or [phone]  *Care during the described time interval was  provided by me and/or other providers on the critical care team. I have reviewed this patient's available data, including medical history, events of note, physical examination and test results as part of my evaluation.

## 2013-06-12 NOTE — Telephone Encounter (Signed)
Contacted by hospital RN.  Patient discharged without Tramadol Rx properly signed. D/c summary reviewed. Pharmacy contacted.

## 2013-06-12 NOTE — Progress Notes (Signed)
Pt's home meds received from main pharmacy and given to pt's wife.

## 2013-06-17 NOTE — Discharge Summary (Signed)
Patient seen and examined, agree with above note.  I dictated the care and orders written for this patient under my direction.  Meredith Kilbride G Taheem Fricke, MD [phone] 

## 2013-06-23 ENCOUNTER — Encounter: Payer: Self-pay | Admitting: Adult Health

## 2013-06-23 ENCOUNTER — Ambulatory Visit (INDEPENDENT_AMBULATORY_CARE_PROVIDER_SITE_OTHER)
Admission: RE | Admit: 2013-06-23 | Discharge: 2013-06-23 | Disposition: A | Discharge status: Home / Self Care | Payer: Medicare Other | Source: Ambulatory Visit | Attending: Adult Health | Admitting: Adult Health

## 2013-06-23 ENCOUNTER — Ambulatory Visit (INDEPENDENT_AMBULATORY_CARE_PROVIDER_SITE_OTHER): Payer: Medicare Other | Admitting: Adult Health

## 2013-06-23 ENCOUNTER — Other Ambulatory Visit (INDEPENDENT_AMBULATORY_CARE_PROVIDER_SITE_OTHER): Payer: Medicare Other

## 2013-06-23 VITALS — BP 144/78 | HR 94 | Temp 98.8°F | Ht 73.0 in | Wt 258.2 lb

## 2013-06-23 DIAGNOSIS — J449 Chronic obstructive pulmonary disease, unspecified: Secondary | ICD-10-CM

## 2013-06-23 DIAGNOSIS — Z930 Tracheostomy status: Secondary | ICD-10-CM

## 2013-06-23 DIAGNOSIS — J4489 Other specified chronic obstructive pulmonary disease: Secondary | ICD-10-CM

## 2013-06-23 LAB — BASIC METABOLIC PANEL
Calcium: 9 mg/dL (ref 8.4–10.5)
Chloride: 99 mEq/L (ref 96–112)
GFR: 74.08 mL/min (ref >60.00)
Glucose, Bld: 103 mg/dL — ABNORMAL HIGH (ref 70–99)
Sodium: 141 mEq/L (ref 135–145)

## 2013-06-23 LAB — CBC WITH DIFFERENTIAL/PLATELET
Basophils Absolute: 0 10*3/uL (ref 0.0–0.1)
Eosinophils Relative: 2.3 % (ref 0.0–5.0)
HCT: 25.7 % — ABNORMAL LOW (ref 39.0–52.0)
Hemoglobin: 8.1 g/dL — ABNORMAL LOW (ref 13.0–17.0)
Lymphs Abs: 0.9 10*3/uL (ref 0.7–4.0)
MCV: 86.4 fl (ref 78.0–100.0)
Monocytes Absolute: 0.4 10*3/uL (ref 0.1–1.0)
Neutro Abs: 4 10*3/uL (ref 1.4–7.7)
Neutrophils Relative %: 73.2 % (ref 43.0–77.0)
Platelets: 140 10*3/uL — ABNORMAL LOW (ref 150.0–400.0)
RDW: 15.6 % — ABNORMAL HIGH (ref 11.5–14.6)
WBC: 5.4 10*3/uL (ref 4.5–10.5)

## 2013-06-23 MED ORDER — LEVOFLOXACIN 500 MG PO TABS: 500.0000 mg | ORAL_TABLET | Freq: Every day | ORAL | Status: AC

## 2013-06-23 NOTE — Progress Notes (Signed)
Quick Note:  ATC pt x2 - line busy ATC pt spouse x1 - line rang with no answer and no option to LM [facility] informing pt of cxr results and pending Levaquin rx sent to pharmacy by TP Will call pt again tomorrow morning to ensure message/rx was received. ______

## 2013-06-23 NOTE — Assessment & Plan Note (Signed)
Chronic respiratory failure, now, trach dependent. Patient was continued on trach collar during the daytime and vent support. At nighttime. Patient will followup with ENT Dr. [name]. This week for trach evaluation. Labs and chest x-ray are pending today.

## 2013-06-23 NOTE — Progress Notes (Signed)
Patient ID: [name] [name], male    DOB: [birth year], 62 y.o.   MRN: [id number]  HPI 01/23/11- COPD, CAD/diastolic dysfunction, OSA, chronic respiratory failure, obesity Hypoventilation, cor pulmonale Hosp since last here- once for "shakes"-they told him wasn't using CPAP right, once for epistaxis, once to place urinary stent, and has had a right ? renal biopsy. Told anemic.  Breathing stable, but can't lie flat- feels smothered even on O2.  Runs O2 2-3L/M usually. I gave permision to go to 4 during exertion. Notes "tiresome" feeling midchest, related to exertion/ climbing stairs and relieved by rest. Told to pace himself and not try to push through that.  Discussed need for O2. He doesn't sleep well with his BiPAP 15/12. Discussed sleep hygiene- discussed room temperature. He was more comfortable with autotitration in hosp and we discussed a trial of that at home.  Has restarted diuretic since leg edema has come back.   05/25/11-  COPD, CAD/diastolic dysfunction, OSA, chronic respiratory failure, obesity Hypoventilation, cor pulmonale Went to ER last week- short of breath. Air wasn't satisfying. Was started back on shot for anemia. Feels some better.  Reports sneezing, postnasal drainage and a sense of mucus in his upper chest. Denies fever, sore throat and doesn't think he has a cold.  06/28/11-  COPD, CAD/diastolic dysfunction, OSA, chronic respiratory failure, obesity Hypoventilation, cor pulmonale Recently hospitalized October 25-29, notes reviewed with him and x-ray images reviewed by me. He was hospitalized for hypercapnic respiratory failure with transient encephalopathy and renal insufficiency. Since discharge he has regained some ankle edema while off of furosemide. He has a nephrology appointment later this month. He is concerned that he was taken off his diabetes medicines and is directed to discuss this with his primary physician immediately. He has some persistent soreness across his  anterior chest wall at the level of the xiphoid but otherwise breathing better with less exertional dyspnea and before he went in the hospital. He remains dependent on continuous oxygen at 2 L. He continues his BiPAP machine all night, every night but says he was sleeping better with it  on autotitration with a lower pressure used in the hospital.  09/25/11- COPD, CAD/diastolic dysfunction, OSA, chronic respiratory failure, obesity Hypoventilation, cor pulmonale Since last here he was hospitalized for respiratory failure with CHF and obesity hypoventilation. Better with diuresis. BiPAP 13/10 is now comfortable and used all night every night with supplemental oxygen 4 L. Tolerated colonoscopy without respiratory distress. Uses a rescue inhaler only occasionally but says it does help then.  11/23/11- COPD, CAD/diastolic dysfunction, OSA, chronic respiratory failure, obesity Hypoventilation, cor pulmonale She is noticing some increased shortness of breath on exertion such as getting in the car but no increase in ankle edema which is well controlled. Occasional cough is not progressive or productive. Noticing more rhinorrhea with the pollen. Continues BiPAP  I 13/E 10 all night every night.  02/07/12- COPD, CAD/diastolic dysfunction, OSA, chronic respiratory failure, obesity Hypoventilation, cor pulmonale  pt states doing some better.wakes up out of a nap coughing feels like he's choking.  Post Hosp- 01/14/12- 01/23/12-discharge diagnoses reviewed with him: Coronary artery disease/CABG, diastolic dysfunction, COPD with chronic respiratory failure, acute respiratory failure with hypoxia, acute on chronic renal failure, DM. He was intubated for 4 days and treated with Zosyn, vancomycin and Avelox, Levaquin, Zithromax and Rocephin. Cultures negative. He was to wean off of prednisone over 3 days. He was hoarse after intubation but that has resolved. He is using oxygen 1-2 L and  BiPAP  13/10 at night. He feels he is  pretty close to his baseline.  03/28/12-  COPD, CAD/diastolic dysfunction/ chronic CHF, OSA, chronic respiratory failure, obesity Hypoventilation, cor pulmonale  6 MWT today. Pt states breathing has been "okay" since last OV. Pt states having trouble falling alseep-not sleeping well on BiPAP. Pt states mask fit well but thinks the pressure needs to be adjusted again. Currently 13/10+ oxygen at 2 L/ Advanced. He sleeps better in a recliner with oxygen but without his BiPAP. Getting iron injections for anemia. We discussed shortness of breath and anemia. Pain left anterior axillary line described as a sore ache over the past week. He is able to raise his arm. Thinks he maybe lifted something wrong. COPD assessment test (CAT) 21/40 CXR 01/14/12- 1 view- IMPRESSION:  Stable support apparatus. Residual mild interstitial prominence  with slight improvement in aeration. No convincing pulmonary  edema. Probable small left pleural effusion with left basilar  atelectasis or infiltrate.  Original Report Authenticated By: Natasha Mead, M.D.   11/08/12-COPD, CAD/diastolic dysfunction/ chronic CHF, OSA, chronic respiratory failure, obesity Hypoventilation, cor pulmonale Hospitalized again between March 9 and 12 for acute on chronic renal failure with congestive heart failure, chronic respiratory failure on BiPAP and acute respiratory failure with hypoxia. He says he is feeling better at this time. Continues oxygen 3 L/Advanced. He has been using BiPAP 13/10 but thinks he slept better with CPAP. We discussed options. Usually when he decompensates it is from fluid overload triggering CO2 retention and hypoventilation.  CXR 10/20/12 IMPRESSION:  Vascular congestion and cardiomegaly, with small bilateral pleural  effusions and bibasilar airspace opacification. Findings are most  compatible with recurrent pulmonary edema, though underlying  pneumonia cannot be excluded.  Original Report Authenticated By: Tonia Ghent,  M.D.  12/02/2012 Post Hospital follow up  Returns for a post hospital follow up .  Reports doing well overall but still having some SOB, thinks this may be due to his pressure not being high enough on BIPAP. Since decreasing BIPAP 1 month ago  down to 12/10 he does not feel as good. Trouble sleeping.  Was admitted for decompensated cor pulmonale w/ fluid overload with subsequent acute on chronic hypercarbic resp failure.  He admits he can not afford his advair and spiriva- therefore he is not taking.  He does not wear BIPAP everynight. We discussed the implications of this and untreated OHS/OSA. He naps a lot but does not wear BIPAP.  He was treated with diuresis with improvement.  Discharged on Lasix 40mg  . Lower dose due to bump in scr. Has OP follow up with Renal d/t renal insufficiency.   02/05/13- COPD, CAD/diastolic dysfunction/ chronic CHF, OSA, chronic respiratory failure, obesity Hypoventilation, cor pulmonale Hosp 6/16-6/19/14 Acute on Chronic Resp Failure Discharge Plan:  -pt educated on BiPAP compliance at home  -continue pulmicort, scheduled albuterol / atrovent (med cost has been an issue)  -wean prednisone to off  -given information for [employer] at [employer]. Pulmonary Rehab was a cost issue (45$ per visit)  -RN home health to review medications and reinforce need for BiPAP 12/12 wO2 2-3L for sleep, O2 3-4 awake.  FOLLOWS FOR: Breathing has improved since leaving the hospital. Reports DOE, chest tightness with exertion and slight coughing from time to time. Wife needs FMLA form. CXR 01/29/13 1View IMPRESSION:  Slightly improved aeration in the lungs may represent decreasing  edema.  Original Report Authenticated By: Richarda Overlie, M.  04/11/13- COPD, CAD/diastolic dysfunction/ chronic CHF, OSA, chronic  respiratory failure, obesity Hypoventilation, cor pulmonale FOLLOWS FOR: reports breathing is essentially unchanged but he feels like he's "burning through more oxygen than  I used to." denies wheezing, increased SOB, tightness, increased cough, f/c/s Just in hospital again acute on chronic respiratory failure with CHF and COPD. Today he feels well except for frontal headache. Continuing oxygen at 2.5 L. BIPAP Auto 5-15/ O2 2L/ Advanced CXR 1V 8/14 IMPRESSION:  1. Extubated, enteric tube removed. Stable lung volumes.  2. Stable ventilation. Small pleural effusions.  Electronically Signed  By: Augusto Gamble  On: 04/02/2013 07:43  06/23/2013 Post Hospital follow up  Patient presents for a post hospital followup. Patient was admitted October 7 through October 30 for acute on chronic respiratory failure. Patient did require intubation for airway protection in the emergency room due to acute encephalopathy. Unfortunately, patient was unable to be weaned from the ventilator and underwent a tracheostomy. Patient was weaned to trach collar during the daytime and vent support. At bedtime. Janina Mayo was placed by ENT Dr. [name]. He was transitioned to an extra long #6 trach prior to discharge.  Patient returns today accompanied by his wife. Patient reports that he has been doing okay. He strength. He is tolerating vent support at night. However, he does feel that this is uncomfortable in certain times. Vent settings at night are PRVC at a rate of 14 with a tidal volume of 450 cc with FiO2 of 40%, and a PEEP of 5. Patient has been using Passy-Muir valve for speech however, over the last couple days, feels it's been more difficulties than his usual. He does have follow with ENT later this week for trach evaluation. Patient denies any hemoptysis, trach site. Bleeding, Orthopnea, PND, or increased leg swelling. Has been eating okay without any noted , dysphagia. Appetite is decreased with no nausea, vomiting, diarrhea.  Review of Systems-see HPI Constitutional:   No- weight loss,  No-night sweats, fevers, chills, +fatigue, lassitude. HEENT:   No-  headaches, difficulty swallowing,  tooth/dental problems, sore throat,       No-sneezing, itching, ear ache, nasal congestion, post nasal drip,  CV:   no-orthopnea, PND,  Much less-swelling in lower extremities, anasarca, dizziness, palpitations Resp: + shortness of breath with exertion or at rest.              No-   productive cough,  + non-productive cough,  No-  coughing up of blood.              No-   change in color of mucus.  No- wheezing.   Skin: No-   rash or lesions. GI:  No-   heartburn, indigestion, abdominal pain, nausea, vomiting,  GU: MS:  No-   joint pain or swelling.  Neuro- nothing unusual Psych:  No- change in mood or affect. No depression or anxiety.  No memory loss. Objective:  OBJ- Physical Exam GEN: A/Ox3; pleasant , NAD, chronically ill appearing   HEENT:  [place]/AT,  EACs-clear, TMs-wnl, NOSE-clear, THROAT-clear, no lesions, no postnasal drip or exudate noted. Trach in place, Dressing clean and dry   NECK:  Supple w/ fair ROM; no JVD; normal carotid impulses w/o bruits; no thyromegaly or nodules palpated; no lymphadenopathy.  RESP  Diminished BS in bases no accessory muscle use, no dullness to percussion  CARD:  RRR, no m/r/g  , tr peripheral edema, pulses intact, no cyanosis or clubbing.  GI:   Soft & nt; nml bowel sounds; no organomegaly or masses detected.  Musco: Warm  bil, no deformities or joint swelling noted.   Neuro: alert, no focal deficits noted.    Skin: Warm, no lesions or rashes

## 2013-06-23 NOTE — Addendum Note (Signed)
Addended by: Julio Sicks on: 06/23/2013 05:28 PM   Modules accepted: Orders

## 2013-06-23 NOTE — Patient Instructions (Addendum)
Continue on current regimen.  follow up with ENT this week for [facility] evaluation.  I will call with labs and xray results.  Continue on Trach Collar during daytime  Continue on Vent support At bedtime   Follow up Dr. Marchelle Gearing in 6-8 weeks and As needed .  Please contact office for sooner follow up if symptoms do not improve or worsen or seek emergency care   Late Add : ? Right sided infiltrate  Add Levaquin 500mg  daily x 7 days #7 , no refills.

## 2013-06-23 NOTE — Assessment & Plan Note (Signed)
Compensated on present regimen. Patient will continue on Brovana  and budesonide twice daily with albuterol as needed.

## 2013-06-24 ENCOUNTER — Telehealth: Payer: Self-pay | Admitting: Internal Medicine

## 2013-06-24 NOTE — Progress Notes (Signed)
Quick Note:  LMOM TCB x1. ______ 

## 2013-06-24 NOTE — Telephone Encounter (Signed)
Spoke with pt's wife and verified that they received message regarding Levaquin rx and f/u with Ramaswamy.

## 2013-06-27 ENCOUNTER — Encounter: Payer: Self-pay | Admitting: Cardiovascular Disease

## 2013-06-27 ENCOUNTER — Ambulatory Visit (INDEPENDENT_AMBULATORY_CARE_PROVIDER_SITE_OTHER): Payer: Medicare Other | Admitting: Cardiovascular Disease

## 2013-06-27 VITALS — BP 142/84 | HR 101 | Ht 73.0 in | Wt 261.0 lb

## 2013-06-27 DIAGNOSIS — I5032 Chronic diastolic (congestive) heart failure: Secondary | ICD-10-CM

## 2013-06-27 DIAGNOSIS — E678 Other specified hyperalimentation: Secondary | ICD-10-CM

## 2013-06-27 DIAGNOSIS — I509 Heart failure, unspecified: Secondary | ICD-10-CM

## 2013-06-27 DIAGNOSIS — I2510 Atherosclerotic heart disease of native coronary artery without angina pectoris: Secondary | ICD-10-CM

## 2013-06-27 NOTE — Assessment & Plan Note (Signed)
Coronary artery bypass graft in 2001 PCI and stenting of an OM branch December 2005. His last cardiac catheterization performed July 2011 revealed a patent LIMA to the LAD and 80% stenosis beyond that, patent vein graft to large ramus branch and a vein to the PDA which had occluded and was restented with drug-eluting stents. His EF was normal. His last Myoview performed earlier this year was nonischemic. He denies chest pain.

## 2013-06-27 NOTE — Assessment & Plan Note (Signed)
Normal LV systolic function by 2-D echocardiogram performed last month with diastolic dysfunction on diuretics. His lower chin edema has almost completely resolved which is amazing for him

## 2013-06-27 NOTE — Patient Instructions (Signed)
Your physician wants you to follow-up in: 6 months with Nada Boozer NP or [facility] and 1 year with Dr Allyson Sabal. You will receive a reminder letter in the mail two months in advance. If you don't receive a letter, please call our office to schedule the follow-up appointment.

## 2013-06-27 NOTE — Assessment & Plan Note (Signed)
Hypertension well controlled on current medications 

## 2013-06-27 NOTE — Progress Notes (Signed)
06/27/2013 [name] [name]   01/09/1951  [id number]  Primary Physician CHIU, Scheryl Marten, MD Primary Cardiologist: Runell Gess MD [name]   HPI: The patient is a 62 year old obese African American male with history of coronary artery disease, diastolic dysfunction, COPD who is O2 dependent, peripheral vascular disease, chronic renal insufficiency stage 3, diabetes mellitus, hypertension and obstructive sleep apnea. The patient was hospitalized earlier in the year with acute respiratory failure and hypoxia, likely related to volume overload from diastolic dyslipidemia. He was diuresed with IV Lasix, metolazone. He was also seen from April 3rd to April 7th, apparently with a COPD exacerbation. The patient has a stent in the left SFA which was put in in September of 2009 for claudication. He also does have a short-segment occlusion of the right mid SFA with tibioperoneal disease as well. He had coronary artery bypass grafting in 2001 with PCI stenting of his OM branch in December of 2005. Last cardiac catheterization was in July of 2011 revealing a patent LIMA to the LAD, 80% stenosis beyond that, and a patent vein to the large venous branch, a total vein to the PDA which was restented using a Taxus ion drug-eluting stent with a patent circumflex to the obtuse marginal branch and normal LV function.   His last nuclear stress test was February of this year. His ejection fraction was 62%; it was a low risk study with normal nuclear imaging. No significant change from previous study. This study was done for ST changes to his EKG which are still present and look similar to previous EKG with a right bundle branch block and slightly tachycardic at 104.   The patient presents today for followup. He states that his lower extremity edema has been getting better for the last week, and in actuality his weight is down 8 pounds from the last office visit in March. He has been taking Lasix 80  mg daily. He does sleep with 2 pillows, and he does complain of some dyspnea on exertion which is better with rest. He also complains of some chest pain which comes and goes, typically with walking. He has taken nitroglycerin once last week and it seemed to help. He denies nausea, vomiting, fever, orthopnea, PND, abdominal pain, hematuria, hematochezia.  He was last seen in the office 12/11/12 by Huey Bienenstock PA-C. I saw him 6 months ago. He has remained stable. His medications have been adjusted including his diuretic as long-acting oral nitrate. He has lost 20 pounds in last 6 months and no longer has lower extremity edema.  [name] was admitted to the hospital 05/20/13 at discharge 3 weeks later for acute on chronic respiratory failure requiring prolonged intubation and tracheostomy. He is currently being taken care of her pulmonary point of view by Dr. Marchelle Gearing.    Current Outpatient Prescriptions  Medication Sig Dispense Refill  . acetaminophen (TYLENOL) 160 MG/5ML solution Take 20.3 mLs (650 mg total) by mouth every 4 (four) hours as needed for fever.  120 mL  0  . albuterol (PROVENTIL) (2.5 MG/3ML) 0.083% nebulizer solution Take 3 mLs (2.5 mg total) by nebulization every 4 (four) hours as needed for wheezing.  75 mL  0  . arformoterol (BROVANA) 15 MCG/2ML NEBU Take 2 mLs (15 mcg total) by nebulization 2 (two) times daily.  120 mL  1  . aspirin 81 MG chewable tablet Chew 1 tablet (81 mg total) by mouth daily.      Marland Kitchen atorvastatin (LIPITOR) 40 MG  tablet Take 1 tablet (40 mg total) by mouth daily at 6 PM.    0  . budesonide (PULMICORT) 0.5 MG/2ML nebulizer solution Take 2 mLs (0.5 mg total) by nebulization 2 (two) times daily.  120 mL  1  . COLCRYS 0.6 MG tablet       . famotidine (PEPCID) 40 MG/5ML suspension Take 2.5 mLs (20 mg total) by mouth 2 (two) times daily.    0  . feeding supplement, GLUCERNA SHAKE, (GLUCERNA SHAKE) LIQD Take 237 mLs by mouth 2 (two) times daily between meals.    0  .  furosemide (LASIX) 20 MG tablet Take 1 tablet (20 mg total) by mouth daily.    0  . levofloxacin (LEVAQUIN) 500 MG tablet Take 1 tablet (500 mg total) by mouth daily.  7 tablet  0  . nitroGLYCERIN (NITROSTAT) 0.4 MG SL tablet Place 1 tablet (0.4 mg total) under the tongue every 5 (five) minutes as needed. For chest pain  25 tablet  6  . traMADol (ULTRAM) 50 MG tablet Take 1 tablet (50 mg total) by mouth every 6 (six) hours as needed for pain.    0   No current facility-administered medications for this visit.   Facility-Administered Medications Ordered in Other Visits  Medication Dose Route Frequency Provider Last Rate Last Dose  . darbepoetin alfa-polysorbate (ARANESP) injection 40 mcg  40 mcg Subcutaneous Q14 Days Irena Cords, MD   40 mcg at 06/19/11 1345    Allergies  Allergen Reactions  . Amlodipine Besy-Benazepril Hcl Swelling    Lips swell  . Percocet [Oxycodone-Acetaminophen] Other (See Comments)    hallucinations    History   Social History  . Marital Status: Married    Spouse Name: N/A    Number of Children: N/A  . Years of Education: N/A   Occupational History  . Not on file.   Social History Main Topics  . Smoking status: Former Smoker -- 1.00 packs/day for 25 years    Types: Cigarettes    Quit date: 08/14/1998  . Smokeless tobacco: Never Used  . Alcohol Use: Yes     Comment: social  . Drug Use: No  . Sexual Activity: Yes   Other Topics Concern  . Not on file   Social History Narrative  . No narrative on file     Review of Systems: General: negative for chills, fever, night sweats or weight changes.  Cardiovascular: negative for chest pain, dyspnea on exertion, edema, orthopnea, palpitations, paroxysmal nocturnal dyspnea or shortness of breath Dermatological: negative for rash Respiratory: negative for cough or wheezing Urologic: negative for hematuria Abdominal: negative for nausea, vomiting, diarrhea, bright red blood per rectum, melena, or  hematemesis Neurologic: negative for visual changes, syncope, or dizziness All other systems reviewed and are otherwise negative except as noted above.    Blood pressure 142/84, pulse 101, height 6\' 1"  (1.854 m), weight 261 lb (118.389 kg).  General appearance: alert and no distress Neck: no adenopathy, no carotid bruit, no JVD, supple, symmetrical, trachea midline and thyroid not enlarged, symmetric, no tenderness/mass/nodules Lungs: clear to auscultation bilaterally Heart: regular rate and rhythm, S1, S2 normal, no murmur, click, rub or gallop Extremities: extremities normal, atraumatic, no cyanosis or edema  EKG not performed today  ASSESSMENT AND PLAN:   CORONARY ARTERY DISEASE, CABG 2001, last PCI 7/11 Coronary artery bypass graft in 2001 PCI and stenting of an OM branch December 2005. His last cardiac catheterization performed July 2011 revealed a patent LIMA  to the LAD and 80% stenosis beyond that, patent vein graft to large ramus branch and a vein to the PDA which had occluded and was restented with drug-eluting stents. His EF was normal. His last Myoview performed earlier this year was nonischemic. He denies chest pain.  Chronic diastolic CHF (congestive heart failure) Normal LV systolic function by 2-D echocardiogram performed last month with diastolic dysfunction on diuretics. His lower chin edema has almost completely resolved which is amazing for him  OBESITY HYPOVENTILATION SYNDROME Recent admission for respiratory distress requiring intubation ultimate tracheostomy followed by Dr. Marchelle Gearing  Hypertensive heart disease Hypertension well controlled on current medications      Runell Gess MD [facility], [facility] 06/27/2013 10:07 AM

## 2013-06-27 NOTE — Assessment & Plan Note (Signed)
Recent admission for respiratory distress requiring intubation ultimate tracheostomy followed by Dr. Marchelle Gearing

## 2013-06-30 ENCOUNTER — Telehealth: Payer: Self-pay | Admitting: Internal Medicine

## 2013-06-30 DIAGNOSIS — J449 Chronic obstructive pulmonary disease, unspecified: Secondary | ICD-10-CM

## 2013-06-30 NOTE — Telephone Encounter (Signed)
CY, please advise if okay to send order as patient is not due to see MR until 07/2013

## 2013-06-30 NOTE — Telephone Encounter (Signed)
I spoke with pt spouse. She is wanting an order sent to [facility] for a larger portable tank. She reports when pt came to OV alst time his tank ran out before he got here with his current tank. Please advise Dr. Maple Hudson if okay to send order? thanks

## 2013-06-30 NOTE — Telephone Encounter (Signed)
Order has been sent. Spouse aware and message sent to [facility]

## 2013-06-30 NOTE — Telephone Encounter (Signed)
Ok to order DME - needs larger portable O2 tank for 2-3 L/min continuous with exertion

## 2013-07-14 ENCOUNTER — Other Ambulatory Visit: Payer: Self-pay | Admitting: *Deleted

## 2013-07-14 MED ORDER — FUROSEMIDE 20 MG PO TABS: 20.0000 mg | ORAL_TABLET | Freq: Every day | ORAL | Status: DC

## 2013-07-21 ENCOUNTER — Ambulatory Visit (INDEPENDENT_AMBULATORY_CARE_PROVIDER_SITE_OTHER): Payer: Medicare Other | Admitting: Internal Medicine

## 2013-07-21 ENCOUNTER — Encounter: Payer: Self-pay | Admitting: Internal Medicine

## 2013-07-21 VITALS — BP 126/82 | HR 116 | Ht 73.0 in | Wt 268.2 lb

## 2013-07-21 DIAGNOSIS — J9610 Chronic respiratory failure, unspecified whether with hypoxia or hypercapnia: Secondary | ICD-10-CM

## 2013-07-21 DIAGNOSIS — J929 Pleural plaque without asbestos: Secondary | ICD-10-CM

## 2013-07-21 NOTE — Progress Notes (Signed)
Subjective:    Patient ID: [name] [name], male    DOB: Aug 26, 1950, 62 y.o.   MRN: [id number]  HPI  01/23/11- COPD, CAD/diastolic dysfunction, OSA, chronic respiratory failure, obesity Hypoventilation, cor pulmonale Hosp since last here- once for "shakes"-they told him wasn't using CPAP right, once for epistaxis, once to place urinary stent, and has had a right ? renal biopsy. Told anemic.  Breathing stable, but can't lie flat- feels smothered even on O2.  Runs O2 2-3L/M usually. I gave permision to go to 4 during exertion. Notes "tiresome" feeling midchest, related to exertion/ climbing stairs and relieved by rest. Told to pace himself and not try to push through that.  Discussed need for O2. He doesn't sleep well with his BiPAP 15/12. Discussed sleep hygiene- discussed room temperature. He was more comfortable with autotitration in hosp and we discussed a trial of that at home.  Has restarted diuretic since leg edema has come back.   05/25/11-  COPD, CAD/diastolic dysfunction, OSA, chronic respiratory failure, obesity Hypoventilation, cor pulmonale Went to ER last week- short of breath. Air wasn't satisfying. Was started back on shot for anemia. Feels some better.  Reports sneezing, postnasal drainage and a sense of mucus in his upper chest. Denies fever, sore throat and doesn't think he has a cold.  06/28/11-  COPD, CAD/diastolic dysfunction, OSA, chronic respiratory failure, obesity Hypoventilation, cor pulmonale Recently hospitalized October 25-29, notes reviewed with him and x-ray images reviewed by me. He was hospitalized for hypercapnic respiratory failure with transient encephalopathy and renal insufficiency. Since discharge he has regained some ankle edema while off of furosemide. He has a nephrology appointment later this month. He is concerned that he was taken off his diabetes medicines and is directed to discuss this with his primary physician immediately. He has some persistent  soreness across his anterior chest wall at the level of the xiphoid but otherwise breathing better with less exertional dyspnea and before he went in the hospital. He remains dependent on continuous oxygen at 2 L. He continues his BiPAP machine all night, every night but says he was sleeping better with it  on autotitration with a lower pressure used in the hospital.  09/25/11- COPD, CAD/diastolic dysfunction, OSA, chronic respiratory failure, obesity Hypoventilation, cor pulmonale Since last here he was hospitalized for respiratory failure with CHF and obesity hypoventilation. Better with diuresis. BiPAP 13/10 is now comfortable and used all night every night with supplemental oxygen 4 L. Tolerated colonoscopy without respiratory distress. Uses a rescue inhaler only occasionally but says it does help then.  11/23/11- COPD, CAD/diastolic dysfunction, OSA, chronic respiratory failure, obesity Hypoventilation, cor pulmonale She is noticing some increased shortness of breath on exertion such as getting in the car but no increase in ankle edema which is well controlled. Occasional cough is not progressive or productive. Noticing more rhinorrhea with the pollen. Continues BiPAP  I 13/E 10 all night every night.  02/07/12- COPD, CAD/diastolic dysfunction, OSA, chronic respiratory failure, obesity Hypoventilation, cor pulmonale  pt states doing some better.wakes up out of a nap coughing feels like he's choking.  Post Hosp- 01/14/12- 01/23/12-discharge diagnoses reviewed with him: Coronary artery disease/CABG, diastolic dysfunction, COPD with chronic respiratory failure, acute respiratory failure with hypoxia, acute on chronic renal failure, DM. He was intubated for 4 days and treated with Zosyn, vancomycin and Avelox, Levaquin, Zithromax and Rocephin. Cultures negative. He was to wean off of prednisone over 3 days. He was hoarse after intubation but that has resolved. He  is using oxygen 1-2 L and BiPAP  13/10 at  night. He feels he is pretty close to his baseline.  03/28/12-  COPD, CAD/diastolic dysfunction/ chronic CHF, OSA, chronic respiratory failure, obesity Hypoventilation, cor pulmonale  6 MWT today. Pt states breathing has been "okay" since last OV. Pt states having trouble falling alseep-not sleeping well on BiPAP. Pt states mask fit well but thinks the pressure needs to be adjusted again. Currently 13/10+ oxygen at 2 L/ Advanced. He sleeps better in a recliner with oxygen but without his BiPAP. Getting iron injections for anemia. We discussed shortness of breath and anemia. Pain left anterior axillary line described as a sore ache over the past week. He is able to raise his arm. Thinks he maybe lifted something wrong. COPD assessment test (CAT) 21/40 CXR 01/14/12- 1 view- IMPRESSION:  Stable support apparatus. Residual mild interstitial prominence  with slight improvement in aeration. No convincing pulmonary  edema. Probable small left pleural effusion with left basilar  atelectasis or infiltrate.  Original Report Authenticated By: [name], M.D.   [birth year]-KXFG, CAD/diastolic dysfunction/ chronic CHF, OSA, chronic respiratory failure, obesity Hypoventilation, cor pulmonale Hospitalized again between March 9 and 12 for acute on chronic renal failure with congestive heart failure, chronic respiratory failure on BiPAP and acute respiratory failure with hypoxia. He says he is feeling better at this time. Continues oxygen 3 L/Advanced. He has been using BiPAP 13/10 but thinks he slept better with CPAP. We discussed options. Usually when he decompensates it is from fluid overload triggering CO2 retention and hypoventilation.  CXR 10/20/12 IMPRESSION:  Vascular congestion and cardiomegaly, with small bilateral pleural  effusions and bibasilar airspace opacification. Findings are most  compatible with recurrent pulmonary edema, though underlying  pneumonia cannot be excluded.  Original Report  Authenticated By: [name], M.D.  12/02/2012 [facility] follow up  Returns for a post hospital follow up .  Reports doing well overall but still having some SOB, thinks this may be due to his pressure not being high enough on BIPAP. Since decreasing BIPAP 1 month ago  down to 12/10 he does not feel as good. Trouble sleeping.  Was admitted for decompensated cor pulmonale w/ fluid overload with subsequent acute on chronic hypercarbic resp failure.  He admits he can not afford his advair and spiriva- therefore he is not taking.  He does not wear BIPAP everynight. We discussed the implications of this and untreated OHS/OSA. He naps a lot but does not wear BIPAP.  He was treated with diuresis with improvement.  Discharged on Lasix 40mg  . Lower dose due to bump in scr. Has OP follow up with Renal d/t renal insufficiency.   02/05/13- COPD, CAD/diastolic dysfunction/ chronic CHF, OSA, chronic respiratory failure, obesity Hypoventilation, cor pulmonale Hosp 6/16-6/19/14 Acute on Chronic Resp Failure Discharge Plan:  -pt educated on BiPAP compliance at home  -continue pulmicort, scheduled albuterol / atrovent (med cost has been an issue)  -wean prednisone to off  -given information for [employer] at [employer]. Pulmonary Rehab was a cost issue (45$ per visit)  -RN home health to review medications and reinforce need for BiPAP 12/12 wO2 2-3L for sleep, O2 3-4 awake.  FOLLOWS FOR: Breathing has improved since leaving the hospital. Reports DOE, chest tightness with exertion and slight coughing from time to time. Wife needs FMLA form. CXR 01/29/13 1View IMPRESSION:  Slightly improved aeration in the lungs may represent decreasing  edema.  Original Report Authenticated By: Markus Daft, M.  04/11/13- COPD,  CAD/diastolic dysfunction/ chronic CHF, OSA, chronic respiratory failure, obesity Hypoventilation, cor pulmonale FOLLOWS FOR: reports breathing is essentially unchanged but he feels like he's  "burning through more oxygen than I used to." denies wheezing, increased SOB, tightness, increased cough, f/c/s Just in hospital again acute on chronic respiratory failure with CHF and COPD. Today he feels well except for frontal headache. Continuing oxygen at 2.5 L. BIPAP Auto 5-15/ O2 2L/ Advanced CXR 1V 8/14 IMPRESSION:  1. Extubated, enteric tube removed. Stable lung volumes.  2. Stable ventilation. Small pleural effusions.  Electronically Signed  By: Augusto Gamble  On: 04/02/2013 07:43  06/23/2013 Post Hospital follow up  Patient presents for a post hospital followup. Patient was admitted October 7 through October 30 for acute on chronic respiratory failure. Patient did require intubation for airway protection in the emergency room due to acute encephalopathy. Unfortunately, patient was unable to be weaned from the ventilator and underwent a tracheostomy. Patient was weaned to trach collar during the daytime and vent support. At bedtime. Janina Mayo was placed by ENT Dr. [name]. He was transitioned to an extra long #6 trach prior to discharge.  Patient returns today accompanied by his wife. Patient reports that he has been doing okay. He strength. He is tolerating vent support at night. However, he does feel that this is uncomfortable in certain times. Vent settings at night are PRVC at a rate of 14 with a tidal volume of 450 cc with FiO2 of 40%, and a PEEP of 5. Patient has been using Passy-Muir valve for speech however, over the last couple days, feels it's been more difficulties than his usual. He does have follow with ENT later this week for trach evaluation. Patient denies any hemoptysis, trach site. Bleeding, Orthopnea, PND, or increased leg swelling. Has been eating okay without any noted , dysphagia. Appetite is decreased with no nausea, vomiting, diarrhea.  REC Continue on current regimen.  follow up with ENT this week for [facility] evaluation.  I will call with labs and xray results.   Continue on Trach Collar during daytime  Continue on Vent support At bedtime   Follow up Dr. Marchelle Gearing in 6-8 weeks and As needed .  Please contact office for sooner follow up if symptoms do not improve or worsen or seek emergency care   Late Add : ? Right sided infiltrate  Add Levaquin 500mg  daily x 7 days #7 , no refills.    OV 07/21/2013   Followup chronic respiratory failure status post tracheostomy. Since seeing my nurse practitioner a month ago he is doing well. Uses ventilator at night and daytime is on tracheostomy collar. He is not compliant with his Passy-Muir valve because it gives him a sensation of suffocation. But he is able to talk with open tracheostomy. He still reports chronic stable dyspnea on exertion for walking a block or sometimes half a block only. This is relieved by rest. Rated as moderate. There is no associated chest pain no worsening cough or phlegm. He did have some hemoptysis yesterday and he is trying to get in contact with the ENT specialist. As noted just beside bleeding. He denies any orthopnea or paroxysmal nocturnal dyspnea. Eating okay.  Lab review I do note that is not had a CT scan of the chest since 2012  Review of Systems  Constitutional: Negative for fever and unexpected weight change.  HENT: Negative for congestion, dental problem, ear pain, nosebleeds, postnasal drip, rhinorrhea, sinus pressure, sneezing, sore throat and trouble swallowing.   Eyes:  Negative for redness and itching.  Respiratory: Positive for cough. Negative for chest tightness, shortness of breath and wheezing.   Cardiovascular: Negative for palpitations and leg swelling.  Gastrointestinal: Negative for nausea and vomiting.  Genitourinary: Negative for dysuria.  Musculoskeletal: Negative for joint swelling.  Skin: Negative for rash.  Neurological: Negative for headaches.  Hematological: Does not bruise/bleed easily.  Psychiatric/Behavioral: Negative for dysphoric mood. The  patient is not nervous/anxious.        Objective:   Physical Exam  Nursing note and vitals reviewed. Constitutional: He is oriented to person, place, and time. He appears well-developed and well-nourished. No distress.  HENT:  Head: Normocephalic and atraumatic.  Right Ear: External ear normal.  Left Ear: External ear normal.  Mouth/Throat: Oropharynx is clear and moist. No oropharyngeal exudate.  Tracheostomy is oxygen on. Guttoral breathing  Eyes: Conjunctivae and EOM are normal. Pupils are equal, round, and reactive to light. Right eye exhibits no discharge. Left eye exhibits no discharge. No scleral icterus.  Neck: Normal range of motion. Neck supple. No JVD present. No tracheal deviation present. No thyromegaly present.  Cardiovascular: Normal rate, regular rhythm and intact distal pulses.  Exam reveals no gallop and no friction rub.   No murmur heard. Pulmonary/Chest: Effort normal and breath sounds normal. No respiratory distress. He has no wheezes. He has no rales. He exhibits no tenderness.  OVerall diminished  Abdominal: Soft. Bowel sounds are normal. He exhibits no distension and no mass. There is no tenderness. There is no rebound and no guarding.  Musculoskeletal: Normal range of motion. He exhibits no edema and no tenderness.  Lymphadenopathy:    He has no cervical adenopathy.  Neurological: He is alert and oriented to person, place, and time. He has normal reflexes. No cranial nerve deficit. Coordination normal.  Skin: Skin is warm and dry. No rash noted. He is not diaphoretic. No erythema. No pallor.  Psychiatric: He has a normal mood and affect. His behavior is normal. Judgment and thought content normal.          Assessment & Plan:

## 2013-07-21 NOTE — Patient Instructions (Signed)
Glad you are doing well CMA will refer you to pumonary rehab on basis of chronic resp failure Please do CT scan chest for pleural fibrosis, pleural plaques Followup with ENT REtrun to see me or my NP in 8 weeks

## 2013-07-24 ENCOUNTER — Ambulatory Visit (INDEPENDENT_AMBULATORY_CARE_PROVIDER_SITE_OTHER)
Admission: RE | Admit: 2013-07-24 | Discharge: 2013-07-24 | Disposition: A | Discharge status: Home / Self Care | Payer: Medicare Other | Source: Ambulatory Visit | Attending: Internal Medicine | Admitting: Internal Medicine

## 2013-07-24 DIAGNOSIS — J929 Pleural plaque without asbestos: Secondary | ICD-10-CM

## 2013-07-24 DIAGNOSIS — J9610 Chronic respiratory failure, unspecified whether with hypoxia or hypercapnia: Secondary | ICD-10-CM

## 2013-07-24 DIAGNOSIS — R091 Pleurisy: Secondary | ICD-10-CM

## 2013-07-28 ENCOUNTER — Telehealth: Payer: Self-pay | Admitting: Internal Medicine

## 2013-07-28 NOTE — Telephone Encounter (Signed)
I called and spoke with pt. He is aware will call once MR lets [place] know regarding his results. Please advise MR thanks

## 2013-07-28 NOTE — Telephone Encounter (Signed)
IMPRESSION: CT chest 07/24/13 1. No acute findings.  2. Stable appearance of bilateral fibro thorax.  3. Mild emphysema  4. Prior CABG procedure  5. Gallstone.    Electronically Signed  By: Signa Kell M.D.  On: 07/24/2013 14:17   Keep fu with TP in Feb 2015   Dr. Kalman Shan, M.D., F.C.C.P Pulmonary and Critical Care Medicine Staff Physician  System [place] Pager: [phone], If no answer or between  15:00h - 7:00h: call [phone]  07/28/2013 10:26 PM

## 2013-07-29 NOTE — Telephone Encounter (Signed)
Pt advised of results. Dee Paden, CMA  

## 2013-07-30 ENCOUNTER — Telehealth: Payer: Self-pay | Admitting: Adult Health

## 2013-07-30 NOTE — Telephone Encounter (Addendum)
Called, spoke with pt - Reports he started to have increased SOB around Dec 8-9.  On Dec 11, he had CT Chest done.  On Monday, he had a new trach placed by Dr. [name], ENT.  Pt reports SOB has worsened since having trach changed.Marland Kitchen  Has increased SOB with "voice box" on and with exertion.  He has some wheezing - not much, some chest tightness with SOB, and coughing some with not much secretions.  He is using albuterol neb with not much relief and the little relief he does receive doesn't last long.  Pt is unable to come in today for OV.  He reports he was seen by TP in Nov and was feeling same way.  Report the abx that was given helped.  Pt was seen by TP on 06/23/13 with the following instructions:  Patient Instructions     Continue on current regimen.  follow up with ENT this week for [facility] evaluation.  I will call with labs and xray results.  Continue on Trach Collar during daytime  Continue on Vent support At bedtime  Follow up Dr. Marchelle Gearing in 6-8 weeks and As needed .  Please contact office for sooner follow up if symptoms do not improve or worsen or seek emergency care  Late Add : ? Right sided infiltrate  Add Levaquin 500mg  daily x 7 days #7 , no refills.    Pt was seen by MR on 07/21/13.  As MR is night float today, will send msg to doc of the day.  KC, pls advise.  Thank you.

## 2013-07-30 NOTE — Telephone Encounter (Signed)
He needs ov for evaluation, and if worsens in the meantime, should go to ER. He should also let his ENT doctor know that his breathing has been worse since having his trach changed.

## 2013-07-30 NOTE — Telephone Encounter (Signed)
Called, spoke with pt -  Informed him of below recs per [facility].  We have scheduled him to see Dr. Sherene Sires tomorrow morning at 11:30 am.  Pt is aware.  He will call Dr. [name] office to inform them of what is going on as well and will seek emergency care if needed prior to OV.

## 2013-07-31 ENCOUNTER — Encounter: Payer: Self-pay | Admitting: Internal Medicine

## 2013-07-31 ENCOUNTER — Ambulatory Visit (INDEPENDENT_AMBULATORY_CARE_PROVIDER_SITE_OTHER): Payer: Medicare Other | Admitting: Internal Medicine

## 2013-07-31 VITALS — BP 136/82 | HR 106 | Temp 98.2°F

## 2013-07-31 DIAGNOSIS — I5032 Chronic diastolic (congestive) heart failure: Secondary | ICD-10-CM

## 2013-07-31 DIAGNOSIS — I509 Heart failure, unspecified: Secondary | ICD-10-CM

## 2013-07-31 DIAGNOSIS — J9610 Chronic respiratory failure, unspecified whether with hypoxia or hypercapnia: Secondary | ICD-10-CM

## 2013-07-31 NOTE — Progress Notes (Signed)
Subjective:    Patient ID: [name] [name], male    DOB: Aug 26, 1950, 62 y.o.   MRN: [id number]  HPI  01/23/11- COPD, CAD/diastolic dysfunction, OSA, chronic respiratory failure, obesity Hypoventilation, cor pulmonale Hosp since last here- once for "shakes"-they told him wasn't using CPAP right, once for epistaxis, once to place urinary stent, and has had a right ? renal biopsy. Told anemic.  Breathing stable, but can't lie flat- feels smothered even on O2.  Runs O2 2-3L/M usually. I gave permision to go to 4 during exertion. Notes "tiresome" feeling midchest, related to exertion/ climbing stairs and relieved by rest. Told to pace himself and not try to push through that.  Discussed need for O2. He doesn't sleep well with his BiPAP 15/12. Discussed sleep hygiene- discussed room temperature. He was more comfortable with autotitration in hosp and we discussed a trial of that at home.  Has restarted diuretic since leg edema has come back.   05/25/11-  COPD, CAD/diastolic dysfunction, OSA, chronic respiratory failure, obesity Hypoventilation, cor pulmonale Went to ER last week- short of breath. Air wasn't satisfying. Was started back on shot for anemia. Feels some better.  Reports sneezing, postnasal drainage and a sense of mucus in his upper chest. Denies fever, sore throat and doesn't think he has a cold.  06/28/11-  COPD, CAD/diastolic dysfunction, OSA, chronic respiratory failure, obesity Hypoventilation, cor pulmonale Recently hospitalized October 25-29, notes reviewed with him and x-ray images reviewed by me. He was hospitalized for hypercapnic respiratory failure with transient encephalopathy and renal insufficiency. Since discharge he has regained some ankle edema while off of furosemide. He has a nephrology appointment later this month. He is concerned that he was taken off his diabetes medicines and is directed to discuss this with his primary physician immediately. He has some persistent  soreness across his anterior chest wall at the level of the xiphoid but otherwise breathing better with less exertional dyspnea and before he went in the hospital. He remains dependent on continuous oxygen at 2 L. He continues his BiPAP machine all night, every night but says he was sleeping better with it  on autotitration with a lower pressure used in the hospital.  09/25/11- COPD, CAD/diastolic dysfunction, OSA, chronic respiratory failure, obesity Hypoventilation, cor pulmonale Since last here he was hospitalized for respiratory failure with CHF and obesity hypoventilation. Better with diuresis. BiPAP 13/10 is now comfortable and used all night every night with supplemental oxygen 4 L. Tolerated colonoscopy without respiratory distress. Uses a rescue inhaler only occasionally but says it does help then.  11/23/11- COPD, CAD/diastolic dysfunction, OSA, chronic respiratory failure, obesity Hypoventilation, cor pulmonale She is noticing some increased shortness of breath on exertion such as getting in the car but no increase in ankle edema which is well controlled. Occasional cough is not progressive or productive. Noticing more rhinorrhea with the pollen. Continues BiPAP  I 13/E 10 all night every night.  02/07/12- COPD, CAD/diastolic dysfunction, OSA, chronic respiratory failure, obesity Hypoventilation, cor pulmonale  pt states doing some better.wakes up out of a nap coughing feels like he's choking.  Post Hosp- 01/14/12- 01/23/12-discharge diagnoses reviewed with him: Coronary artery disease/CABG, diastolic dysfunction, COPD with chronic respiratory failure, acute respiratory failure with hypoxia, acute on chronic renal failure, DM. He was intubated for 4 days and treated with Zosyn, vancomycin and Avelox, Levaquin, Zithromax and Rocephin. Cultures negative. He was to wean off of prednisone over 3 days. He was hoarse after intubation but that has resolved. He  is using oxygen 1-2 L and BiPAP  13/10 at  night. He feels he is pretty close to his baseline.  03/28/12-  COPD, CAD/diastolic dysfunction/ chronic CHF, OSA, chronic respiratory failure, obesity Hypoventilation, cor pulmonale  6 MWT today. Pt states breathing has been "okay" since last OV. Pt states having trouble falling alseep-not sleeping well on BiPAP. Pt states mask fit well but thinks the pressure needs to be adjusted again. Currently 13/10+ oxygen at 2 L/ Advanced. He sleeps better in a recliner with oxygen but without his BiPAP. Getting iron injections for anemia. We discussed shortness of breath and anemia. Pain left anterior axillary line described as a sore ache over the past week. He is able to raise his arm. Thinks he maybe lifted something wrong. COPD assessment test (CAT) 21/40 CXR 01/14/12- 1 view- IMPRESSION:  Stable support apparatus. Residual mild interstitial prominence  with slight improvement in aeration. No convincing pulmonary  edema. Probable small left pleural effusion with left basilar  atelectasis or infiltrate.  Original Report Authenticated By: [name], M.D.   [birth year]49-KXFG, CAD/diastolic dysfunction/ chronic CHF, OSA, chronic respiratory failure, obesity Hypoventilation, cor pulmonale Hospitalized again between March 9 and 12 for acute on chronic renal failure with congestive heart failure, chronic respiratory failure on BiPAP and acute respiratory failure with hypoxia. He says he is feeling better at this time. Continues oxygen 3 L/Advanced. He has been using BiPAP 13/10 but thinks he slept better with CPAP. We discussed options. Usually when he decompensates it is from fluid overload triggering CO2 retention and hypoventilation.  CXR 10/20/12 IMPRESSION:  Vascular congestion and cardiomegaly, with small bilateral pleural  effusions and bibasilar airspace opacification. Findings are most  compatible with recurrent pulmonary edema, though underlying  pneumonia cannot be excluded.  Original Report  Authenticated By: [name], M.D.  12/02/2012 [facility] follow up  Returns for a post hospital follow up .  Reports doing well overall but still having some SOB, thinks this may be due to his pressure not being high enough on BIPAP. Since decreasing BIPAP 1 month ago  down to 12/10 he does not feel as good. Trouble sleeping.  Was admitted for decompensated cor pulmonale w/ fluid overload with subsequent acute on chronic hypercarbic resp failure.  He admits he can not afford his advair and spiriva- therefore he is not taking.  He does not wear BIPAP everynight. We discussed the implications of this and untreated OHS/OSA. He naps a lot but does not wear BIPAP.  He was treated with diuresis with improvement.  Discharged on Lasix 40mg  . Lower dose due to bump in scr. Has OP follow up with Renal d/t renal insufficiency.   02/05/13- COPD, CAD/diastolic dysfunction/ chronic CHF, OSA, chronic respiratory failure, obesity Hypoventilation, cor pulmonale Hosp 6/16-6/19/14 Acute on Chronic Resp Failure Discharge Plan:  -pt educated on BiPAP compliance at home  -continue pulmicort, scheduled albuterol / atrovent (med cost has been an issue)  -wean prednisone to off  -given information for [employer] at [employer]. Pulmonary Rehab was a cost issue (45$ per visit)  -RN home health to review medications and reinforce need for BiPAP 12/12 wO2 2-3L for sleep, O2 3-4 awake.  FOLLOWS FOR: Breathing has improved since leaving the hospital. Reports DOE, chest tightness with exertion and slight coughing from time to time. Wife needs FMLA form. CXR 01/29/13 1View IMPRESSION:  Slightly improved aeration in the lungs may represent decreasing  edema.  Original Report Authenticated By: Markus Daft, M.  04/11/13- COPD,  CAD/diastolic dysfunction/ chronic CHF, OSA, chronic respiratory failure, obesity Hypoventilation, cor pulmonale FOLLOWS FOR: reports breathing is essentially unchanged but he feels like he's  "burning through more oxygen than I used to." denies wheezing, increased SOB, tightness, increased cough, f/c/s Just in hospital again acute on chronic respiratory failure with CHF and COPD. Today he feels well except for frontal headache. Continuing oxygen at 2.5 L. BIPAP Auto 5-15/ O2 2L/ Advanced CXR 1V 8/14 IMPRESSION:  1. Extubated, enteric tube removed. Stable lung volumes.  2. Stable ventilation. Small pleural effusions.  Electronically Signed  By: Augusto Gamble  On: 04/02/2013 07:43  06/23/2013 Post Hospital follow up  Patient presents for a post hospital followup. Patient was admitted October 7 through October 30 for acute on chronic respiratory failure. Patient did require intubation for airway protection in the emergency room due to acute encephalopathy. Unfortunately, patient was unable to be weaned from the ventilator and underwent a tracheostomy. Patient was weaned to trach collar during the daytime and vent support. At bedtime. Janina Mayo was placed by ENT Dr. [name]. He was transitioned to an extra long #6 trach prior to discharge.  Patient returns today accompanied by his wife. Patient reports that he has been doing okay. He strength. He is tolerating vent support at night. However, he does feel that this is uncomfortable in certain times. Vent settings at night are PRVC at a rate of 14 with a tidal volume of 450 cc with FiO2 of 40%, and a PEEP of 5. Patient has been using Passy-Muir valve for speech however, over the last couple days, feels it's been more difficulties than his usual. He does have follow with ENT later this week for trach evaluation. Patient denies any hemoptysis, trach site. Bleeding, Orthopnea, PND, or increased leg swelling. Has been eating okay without any noted , dysphagia. Appetite is decreased with no nausea, vomiting, diarrhea.  REC Continue on current regimen.  follow up with ENT this week for [facility] evaluation.  I will call with labs and xray results.   Continue on Trach Collar during daytime  Continue on Vent support At bedtime   Follow up Dr. Marchelle Gearing in 6-8 weeks and As needed .  Please contact office for sooner follow up if symptoms do not improve or worsen or seek emergency care   Late Add : ? Right sided infiltrate  Add Levaquin 500mg  daily x 7 days #7 , no refills.    OV 07/21/2013   Followup chronic respiratory failure status post tracheostomy. Since seeing my nurse practitioner a month ago he is doing well. Uses ventilator at night and daytime is on tracheostomy collar. He is not compliant with his Passy-Muir valve because it gives him a sensation of suffocation. But he is able to talk with open tracheostomy. He still reports chronic stable dyspnea on exertion for walking a block or sometimes half a block only. This is relieved by rest. Rated as moderate. There is no associated chest pain no worsening cough or phlegm. He did have some hemoptysis yesterday and he is trying to get in contact with the ENT specialist. As noted just beside bleeding. He denies any orthopnea or paroxysmal nocturnal dyspnea. Eating okay.  Lab review I do note that is not had a CT scan of the chest since 2012 rec Glad you are doing well CMA will refer you to pumonary rehab on basis of chronic resp failure Please do CT scan chest for pleural fibrosis, pleural plaques Followup with ENT> Dr [name], changed trach on  12/15  07/31/2013 f/u ov/[place]ie Basher re: worse since 07/24/13 (day of CT Chest - see below) Chief Complaint  Patient presents with  . Acute Visit    Pt c/o increased SOB for the past several days. He states gets out of breath with any sort of exertion.  He has minimal cough that is non prod.   mucus is clear/ not bloody Baseline use of alb not much at all,  One every few days at most Assoc with increased fluid in legs  But followed by Allyson Sabal who adjusts his lasix  No obvious patterns in day to day or daytime variabilty or assoc chronic cough or cp or  chest tightness, subjective wheeze overt sinus or hb symptoms. No unusual exp hx or h/o childhood pna/ asthma or knowledge of premature birth.  Sleeping ok without nocturnal  or early am exacerbation  of respiratory  c/o's or need for noct saba. Also denies any obvious fluctuation of symptoms with weather or environmental changes or other aggravating or alleviating factors except as outlined above   Current Medications, Allergies, Complete Past Medical History, Past Surgical History, Family History, and Social History were reviewed in [employer] record.  ROS  The following are not active complaints unless bolded sore throat, dysphagia, dental problems, itching, sneezing,  nasal congestion or excess/ purulent secretions, ear ache,   fever, chills, sweats, unintended wt loss, pleuritic or exertional cp, hemoptysis,  orthopnea pnd or leg swelling, presyncope, palpitations, heartburn, abdominal pain, anorexia, nausea, vomiting, diarrhea  or change in bowel or urinary habits, change in stools or urine, dysuria,hematuria,  rash, arthralgias, visual complaints, headache, numbness weakness or ataxia or problems with walking or coordination,  change in mood/affect or memory.              Objective:   Physical Exam  Nursing note and vitals reviewed. Elderly w/c bound bm nad  HENT:  Head: Normocephalic and atraumatic.  Right Ear: External ear normal.  Left Ear: External ear normal.  Mouth/Throat: Oropharynx is clear and moist. No oropharyngeal exudate.  Tracheostomy in place/ on T collar 02 Eyes: Conjunctivae and EOM are normal. Pupils are equal, round, and reactive to light. Right eye exhibits no discharge. Left eye exhibits no discharge. No scleral icterus.  Neck: Normal range of motion. Neck supple. No JVD present. No tracheal deviation present. No thyromegaly present.  Cardiovascular: Normal rate, regular rhythm and intact distal pulses.  Exam reveals no gallop and no  friction rub.   No murmur heard. Pulmonary/Chest: Effort normal and breath sounds normal. No respiratory distress. He has no wheezes. He has no rales. He exhibits no tenderness.  Overall diminished  Abdominal: Soft. Bowel sounds are normal. He exhibits no distension and no mass. There is no tenderness. There is no rebound and no guarding.  Musculoskeletal: Normal range of motion. He exhibits 2+ pitting bilateral pitting LE's sym with no calf tenderness.  Lymphadenopathy:    He has no cervical adenopathy.  Neurological: He is alert and oriented to person, place, and time. He has normal reflexes. No cranial nerve deficit. Coordination normal.  Skin: Skin is warm and dry. No rash noted. He is not diaphoretic. No erythema. No pallor.  Psychiatric: He has a normal mood and affect. His behavior is normal. Judgment and thought content normal.     CT s contrast 07/24/13 1. No acute findings.  2. Stable appearance of bilateral fibro thorax.  3. Mild emphysema  4. Prior CABG procedure  5. Gallstone.  Assessment & Plan:

## 2013-07-31 NOTE — Patient Instructions (Signed)
When more short of breath first try nebulizer with albuterol up to every 4 hours as needed  If still short of breath ok to resume the ventilator during the day   Take two lasix a day until you get further input from Dr Allyson Sabal

## 2013-08-01 NOTE — Assessment & Plan Note (Signed)
Glad you are doing well CMA will refer you to pumonary rehab on basis of chronic resp failure Please do CT scan chest for pleural fibrosis, pleural plaques Followup with ENT REtrun to see me or my NP in 8 week

## 2013-08-01 NOTE — Assessment & Plan Note (Signed)
Records indicate he has home vent and his breathing is better when he's on it so if more sob should use the vent more consistently daytime as well - the only new feature to his chronic sob is that his legs are more swollen but Dr Allyson Sabal is in charge of his lasix > his last CT s contrast done the day he started having more breathing problems did not suggest pna or chf so no urgency to cards f/u but in meantime asked him to increase lasix to 2 daily   See instructions for specific recommendations which were reviewed directly with the patient who was given a copy with highlighter outlining the key components.

## 2013-08-01 NOTE — Assessment & Plan Note (Signed)
05/21/13 echoLeft ventricle: The cavity size was mildly dilated. Wall thickness was increased in a pattern of mild LVH. Systolic function was normal. The estimated ejection fraction was in the range of 55% to 60%. Wall motion was normal; there were no regional wall motion abnormalities. Doppler parameters are consistent with abnormal left ventricular relaxation (grade 1 diastolic dysfunction). - Ventricular septum: Septal motion showed abnormal function and dyssynergy. - Aortic valve: Trileaflet; mildly thickened, moderately calcified leaflets. There was mild stenosis. Trivial regurgitation. Valve area: 1.53cm^2(VTI). Valve area: 1.67cm^2 (Vmax). - Mitral valve: Mildly calcified annulus. - Left atrium: The atrium was moderately dilated.    rec double lasix until checks with Dr Allyson Sabal as he has significant pitting edema on exam and this correlates with worse sob

## 2013-08-04 ENCOUNTER — Telehealth (HOSPITAL_COMMUNITY): Payer: Self-pay | Admitting: *Deleted

## 2013-08-11 ENCOUNTER — Telehealth (HOSPITAL_COMMUNITY): Payer: Self-pay | Admitting: *Deleted

## 2013-08-18 ENCOUNTER — Telehealth (HOSPITAL_COMMUNITY): Payer: Self-pay | Admitting: *Deleted

## 2013-08-19 ENCOUNTER — Telehealth (HOSPITAL_COMMUNITY): Payer: Self-pay | Admitting: *Deleted

## 2013-09-15 ENCOUNTER — Ambulatory Visit (INDEPENDENT_AMBULATORY_CARE_PROVIDER_SITE_OTHER): Payer: Medicare Other | Admitting: Adult Health

## 2013-09-15 ENCOUNTER — Encounter: Payer: Self-pay | Admitting: Adult Health

## 2013-09-15 VITALS — BP 124/74 | HR 84 | Temp 99.1°F | Ht 73.0 in | Wt 273.0 lb

## 2013-09-15 DIAGNOSIS — J9610 Chronic respiratory failure, unspecified whether with hypoxia or hypercapnia: Secondary | ICD-10-CM

## 2013-09-15 DIAGNOSIS — J449 Chronic obstructive pulmonary disease, unspecified: Secondary | ICD-10-CM

## 2013-09-15 MED ORDER — DOXYCYCLINE HYCLATE 100 MG PO TABS: 100.0000 mg | ORAL_TABLET | Freq: Two times a day (BID) | ORAL | Status: DC

## 2013-09-15 MED ORDER — PREDNISONE 10 MG PO TABS: ORAL_TABLET | ORAL | Status: DC

## 2013-09-15 NOTE — Progress Notes (Signed)
Subjective:    Patient ID: [name] [name], male    DOB: Aug 26, 1950, 63 y.o.   MRN: [id number]  HPI  01/23/11- COPD, CAD/diastolic dysfunction, OSA, chronic respiratory failure, obesity Hypoventilation, cor pulmonale Hosp since last here- once for "shakes"-they told him wasn't using CPAP right, once for epistaxis, once to place urinary stent, and has had a right ? renal biopsy. Told anemic.  Breathing stable, but can't lie flat- feels smothered even on O2.  Runs O2 2-3L/M usually. I gave permision to go to 4 during exertion. Notes "tiresome" feeling midchest, related to exertion/ climbing stairs and relieved by rest. Told to pace himself and not try to push through that.  Discussed need for O2. He doesn't sleep well with his BiPAP 15/12. Discussed sleep hygiene- discussed room temperature. He was more comfortable with autotitration in hosp and we discussed a trial of that at home.  Has restarted diuretic since leg edema has come back.   05/25/11-  COPD, CAD/diastolic dysfunction, OSA, chronic respiratory failure, obesity Hypoventilation, cor pulmonale Went to ER last week- short of breath. Air wasn't satisfying. Was started back on shot for anemia. Feels some better.  Reports sneezing, postnasal drainage and a sense of mucus in his upper chest. Denies fever, sore throat and doesn't think he has a cold.  06/28/11-  COPD, CAD/diastolic dysfunction, OSA, chronic respiratory failure, obesity Hypoventilation, cor pulmonale Recently hospitalized October 25-29, notes reviewed with him and x-ray images reviewed by me. He was hospitalized for hypercapnic respiratory failure with transient encephalopathy and renal insufficiency. Since discharge he has regained some ankle edema while off of furosemide. He has a nephrology appointment later this month. He is concerned that he was taken off his diabetes medicines and is directed to discuss this with his primary physician immediately. He has some persistent  soreness across his anterior chest wall at the level of the xiphoid but otherwise breathing better with less exertional dyspnea and before he went in the hospital. He remains dependent on continuous oxygen at 2 L. He continues his BiPAP machine all night, every night but says he was sleeping better with it  on autotitration with a lower pressure used in the hospital.  09/25/11- COPD, CAD/diastolic dysfunction, OSA, chronic respiratory failure, obesity Hypoventilation, cor pulmonale Since last here he was hospitalized for respiratory failure with CHF and obesity hypoventilation. Better with diuresis. BiPAP 13/10 is now comfortable and used all night every night with supplemental oxygen 4 L. Tolerated colonoscopy without respiratory distress. Uses a rescue inhaler only occasionally but says it does help then.  11/23/11- COPD, CAD/diastolic dysfunction, OSA, chronic respiratory failure, obesity Hypoventilation, cor pulmonale She is noticing some increased shortness of breath on exertion such as getting in the car but no increase in ankle edema which is well controlled. Occasional cough is not progressive or productive. Noticing more rhinorrhea with the pollen. Continues BiPAP  I 13/E 10 all night every night.  02/07/12- COPD, CAD/diastolic dysfunction, OSA, chronic respiratory failure, obesity Hypoventilation, cor pulmonale  pt states doing some better.wakes up out of a nap coughing feels like he's choking.  Post Hosp- 01/14/12- 01/23/12-discharge diagnoses reviewed with him: Coronary artery disease/CABG, diastolic dysfunction, COPD with chronic respiratory failure, acute respiratory failure with hypoxia, acute on chronic renal failure, DM. He was intubated for 4 days and treated with Zosyn, vancomycin and Avelox, Levaquin, Zithromax and Rocephin. Cultures negative. He was to wean off of prednisone over 3 days. He was hoarse after intubation but that has resolved. He  is using oxygen 1-2 L and BiPAP  13/10 at  night. He feels he is pretty close to his baseline.  03/28/12-  COPD, CAD/diastolic dysfunction/ chronic CHF, OSA, chronic respiratory failure, obesity Hypoventilation, cor pulmonale  6 MWT today. Pt states breathing has been "okay" since last OV. Pt states having trouble falling alseep-not sleeping well on BiPAP. Pt states mask fit well but thinks the pressure needs to be adjusted again. Currently 13/10+ oxygen at 2 L/ Advanced. He sleeps better in a recliner with oxygen but without his BiPAP. Getting iron injections for anemia. We discussed shortness of breath and anemia. Pain left anterior axillary line described as a sore ache over the past week. He is able to raise his arm. Thinks he maybe lifted something wrong. COPD assessment test (CAT) 21/40 CXR 01/14/12- 1 view- IMPRESSION:  Stable support apparatus. Residual mild interstitial prominence  with slight improvement in aeration. No convincing pulmonary  edema. Probable small left pleural effusion with left basilar  atelectasis or infiltrate.  Original Report Authenticated By: [name], M.D.   02/13/49-KXFG, CAD/diastolic dysfunction/ chronic CHF, OSA, chronic respiratory failure, obesity Hypoventilation, cor pulmonale Hospitalized again between March 9 and 12 for acute on chronic renal failure with congestive heart failure, chronic respiratory failure on BiPAP and acute respiratory failure with hypoxia. He says he is feeling better at this time. Continues oxygen 3 L/Advanced. He has been using BiPAP 13/10 but thinks he slept better with CPAP. We discussed options. Usually when he decompensates it is from fluid overload triggering CO2 retention and hypoventilation.  CXR 10/20/12 IMPRESSION:  Vascular congestion and cardiomegaly, with small bilateral pleural  effusions and bibasilar airspace opacification. Findings are most  compatible with recurrent pulmonary edema, though underlying  pneumonia cannot be excluded.  Original Report  Authenticated By: [name], M.D.  12/02/2012 [facility] follow up  Returns for a post hospital follow up .  Reports doing well overall but still having some SOB, thinks this may be due to his pressure not being high enough on BIPAP. Since decreasing BIPAP 1 month ago  down to 12/10 he does not feel as good. Trouble sleeping.  Was admitted for decompensated cor pulmonale w/ fluid overload with subsequent acute on chronic hypercarbic resp failure.  He admits he can not afford his advair and spiriva- therefore he is not taking.  He does not wear BIPAP everynight. We discussed the implications of this and untreated OHS/OSA. He naps a lot but does not wear BIPAP.  He was treated with diuresis with improvement.  Discharged on Lasix 40mg  . Lower dose due to bump in scr. Has OP follow up with Renal d/t renal insufficiency.   02/05/13- COPD, CAD/diastolic dysfunction/ chronic CHF, OSA, chronic respiratory failure, obesity Hypoventilation, cor pulmonale Hosp 6/16-6/19/14 Acute on Chronic Resp Failure Discharge Plan:  -pt educated on BiPAP compliance at home  -continue pulmicort, scheduled albuterol / atrovent (med cost has been an issue)  -wean prednisone to off  -given information for [employer] at [employer]. Pulmonary Rehab was a cost issue (45$ per visit)  -RN home health to review medications and reinforce need for BiPAP 12/12 wO2 2-3L for sleep, O2 3-4 awake.  FOLLOWS FOR: Breathing has improved since leaving the hospital. Reports DOE, chest tightness with exertion and slight coughing from time to time. Wife needs FMLA form. CXR 01/29/13 1View IMPRESSION:  Slightly improved aeration in the lungs may represent decreasing  edema.  Original Report Authenticated By: Markus Daft, M.  04/11/13- COPD,  CAD/diastolic dysfunction/ chronic CHF, OSA, chronic respiratory failure, obesity Hypoventilation, cor pulmonale FOLLOWS FOR: reports breathing is essentially unchanged but he feels like he's  "burning through more oxygen than I used to." denies wheezing, increased SOB, tightness, increased cough, f/c/s Just in hospital again acute on chronic respiratory failure with CHF and COPD. Today he feels well except for frontal headache. Continuing oxygen at 2.5 L. BIPAP Auto 5-15/ O2 2L/ Advanced CXR 1V 8/14 IMPRESSION:  1. Extubated, enteric tube removed. Stable lung volumes.  2. Stable ventilation. Small pleural effusions.  Electronically Signed  By: Lars Pinks  On: 04/02/2013 07:43  06/23/2013 [facility] follow up  Patient presents for a post hospital followup. Patient was admitted October 7 through October 30 for acute on chronic respiratory failure. Patient did require intubation for airway protection in the emergency room due to acute encephalopathy. Unfortunately, patient was unable to be weaned from the ventilator and underwent a tracheostomy. Patient was weaned to trach collar during the daytime and vent support. At bedtime. Lurline Idol was placed by ENT Dr. Redmond Baseman. He was transitioned to an extra long #6 trach prior to discharge.  Patient returns today accompanied by his wife. Patient reports that he has been doing okay. He strength. He is tolerating vent support at night. However, he does feel that this is uncomfortable in certain times. Vent settings at night are PRVC at a rate of 14 with a tidal volume of 450 cc with FiO2 of 40%, and a PEEP of 5. Patient has been using Passy-Muir valve for speech however, over the last couple days, feels it's been more difficulties than his usual. He does have follow with ENT later this week for trach evaluation. Patient denies any hemoptysis, trach site. Bleeding, Orthopnea, PND, or increased leg swelling. Has been eating okay without any noted , dysphagia. Appetite is decreased with no nausea, vomiting, diarrhea.  REC Continue on current regimen.  follow up with ENT this week for [facility] evaluation.  I will call with labs and xray results.   Continue on Trach Collar during daytime  Continue on Vent support At bedtime   Follow up Dr. Chase Caller in 6-8 weeks and As needed .  Please contact office for sooner follow up if symptoms do not improve or worsen or seek emergency care   Late Add : ? Right sided infiltrate  Add Levaquin 500mg  daily x 7 days #7 , no refills.    OV 07/21/2013 Followup chronic respiratory failure status post tracheostomy. Since seeing my nurse practitioner a month ago he is doing well. Uses ventilator at night and daytime is on tracheostomy collar. He is not compliant with his Passy-Muir valve because it gives him a sensation of suffocation. But he is able to talk with open tracheostomy. He still reports chronic stable dyspnea on exertion for walking a block or sometimes half a block only. This is relieved by rest. Rated as moderate. There is no associated chest pain no worsening cough or phlegm. He did have some hemoptysis yesterday and he is trying to get in contact with the ENT specialist. As noted just beside bleeding. He denies any orthopnea or paroxysmal nocturnal dyspnea. Eating okay.  Lab review I do note that is not had a CT scan of the chest since 2012 rec Glad you are doing well CMA will refer you to pumonary rehab on basis of chronic resp failure Please do CT scan chest for pleural fibrosis, pleural plaques Followup with ENT> Dr Redmond Baseman, changed trach on 12/15  07/31/2013 f/u ov/Wert re: worse since 07/24/13 (day of CT Chest - see below) Chief Complaint  Patient presents with  . Acute Visit    Pt c/o increased SOB for the past several days. He states gets out of breath with any sort of exertion.  He has minimal cough that is non prod.   mucus is clear/ not bloody Baseline use of alb not much at all,  One every few days at most Assoc with increased fluid in legs  But followed by Gwenlyn Found who adjusts his lasix  No obvious patterns in day to day or daytime variabilty or assoc chronic cough or cp or  chest tightness, subjective wheeze overt sinus or hb symptoms. No unusual exp hx or h/o childhood pna/ asthma or knowledge of premature birth.  Sleeping ok without nocturnal  or early am exacerbation  of respiratory  c/o's or need for noct saba. Also denies any obvious fluctuation of symptoms with weather or environmental changes or other aggravating or alleviating factors except as outlined above    09/15/2013 Follow up  Returns for a 2 month follow up for COPD, chronic resp failure -trach dependent.  Reports breathing has worsened x2 weeks w/ increased SOB, chest tightness, cough with white mucus. Coughing and congestion make it difficult to use vent at night . Denies any f/c/s, nausea, vomiting, hemoptysis, edema.  Was referred to pulmonary rehab however could not afford co-pay amounts. We discussed looking into pt assistance.     Current Medications, Allergies, Complete Past Medical History, Past Surgical History, Family History, and Social History were reviewed in [employer] record.  ROS  The following are not active complaints unless bolded sore throat, dysphagia, dental problems, itching, sneezing,  nasal congestion or excess/ purulent secretions, ear ache,   fever, chills, sweats, unintended wt loss, pleuritic or exertional cp, hemoptysis,  orthopnea pnd or   presyncope, palpitations, heartburn, abdominal pain, anorexia, nausea, vomiting, diarrhea  or change in bowel or urinary habits, change in stools or urine, dysuria,hematuria,  rash, arthralgias, visual complaints, headache, numbness    change in mood/affect or memory.              Objective:   Physical Exam  GEN: A/Ox3; pleasant , NAD, chronically ill appearing male   HEENT:  [place]/AT,  EACs-clear, TMs-wnl, NOSE-clear, THROAT-clear, no lesions, no postnasal drip or exudate noted.   NECK:  Supple w/ fair ROM; no JVD; normal carotid impulses w/o bruits; no thyromegaly or nodules palpated; no  lymphadenopathy.Trach midline and intact, dressing clean and dry.   RESP  Diminished BS in bases , no accessory muscle use, no dullness to percussion  CARD:  RRR, no m/r/g  , no peripheral edema, pulses intact, no cyanosis or clubbing.  GI:   Soft & nt; nml bowel sounds; no organomegaly or masses detected.  Musco: Warm bil, no deformities or joint swelling noted.   Neuro: alert, no focal deficits noted.    Skin: Warm, no lesions or rashes    CT s contrast 07/24/13 1. No acute findings.  2. Stable appearance of bilateral fibro thorax.  3. Mild emphysema  4. Prior CABG procedure  5. Gallstone.      Assessment & Plan:

## 2013-09-15 NOTE — Patient Instructions (Signed)
Doxycycline 100mg  Twice daily  For 7 days  Mucinex DM Twice daily  As needed  Cough/congestion  Prednisone taper over next week.  Will look into assitance for pulmonary rehab, if not able then consider Physical therapy at home.  Continue on Trach Collar during daytime  Continue on Vent support At bedtime   Follow up Dr. Chase Caller in 6-8 weeks and As needed .  Please contact office for sooner follow up if symptoms do not improve or worsen or seek emergency care

## 2013-09-18 NOTE — Assessment & Plan Note (Addendum)
Mild Flare with bronchitis    Plan  Doxycycline 100mg  Twice daily  For 7 days  Mucinex DM Twice daily  As needed  Cough/congestion  Prednisone taper over next week.  Will look into assitance for pulmonary rehab, if not able then consider Physical therapy at home.  Continue on Trach Collar during daytime  Continue on Vent support At bedtime   Follow up Dr. Chase Caller in 6-8 weeks and As needed .  Please contact office for sooner follow up if symptoms do not improve or worsen or seek emergency care

## 2013-09-18 NOTE — Assessment & Plan Note (Signed)
Chronic RF -nocturnal vent dependent   Plan  Will look into assitance for pulmonary rehab, if not able then consider Physical therapy at home.  Continue on Trach Collar during daytime  Continue on Vent support At bedtime   Follow up Dr. Chase Caller in 6-8 weeks and As needed .  Please contact office for sooner follow up if symptoms do not improve or worsen or seek emergency care

## 2013-10-07 ENCOUNTER — Other Ambulatory Visit (HOSPITAL_COMMUNITY): Payer: Self-pay

## 2013-10-08 ENCOUNTER — Encounter (HOSPITAL_COMMUNITY): Payer: Medicare Other

## 2013-10-13 ENCOUNTER — Telehealth: Payer: Self-pay | Admitting: Internal Medicine

## 2013-10-13 DIAGNOSIS — J4489 Other specified chronic obstructive pulmonary disease: Secondary | ICD-10-CM

## 2013-10-13 DIAGNOSIS — J449 Chronic obstructive pulmonary disease, unspecified: Secondary | ICD-10-CM

## 2013-10-13 MED ORDER — PREDNISONE 10 MG PO TABS: ORAL_TABLET | ORAL | Status: DC

## 2013-10-13 MED ORDER — LEVOFLOXACIN 500 MG PO TABS
500.0000 mg | ORAL_TABLET | Freq: Every day | ORAL | Status: DC
Start: 2013-10-13 — End: 2013-10-28

## 2013-10-13 NOTE — Telephone Encounter (Signed)
take levaquin 500mg  once daily  X 6 days  Take prednisone 40 mg daily x 2 days, then 20mg  daily x 2 days, then 10mg  daily x 2 days, then 5mg  daily x 2 days and stop   If worse go to ER  Likely AECOPD

## 2013-10-13 NOTE — Telephone Encounter (Signed)
Pt returning call pls call back.[name] [name] °

## 2013-10-13 NOTE — Telephone Encounter (Signed)
lmomtcb to inform pt of below recs and advised we are working with [facility] to see about getting him more o2 tanks. Rxs sent to [facility].

## 2013-10-13 NOTE — Telephone Encounter (Signed)
Order has been placed. Attempted to call pt, no answer. Will try back later.

## 2013-10-13 NOTE — Telephone Encounter (Signed)
Called, spoke with pt.  C/o runny nose with clear drainage and cough - occas prod with small amount of clear mucus.  Symptoms started on Saturday.  Also has some wheezing and crackling in voice after taking breathing txs.  SOB is unchanged.  No chest tightness/CP or fever.  Hasn't tried any OTC meds yet - requesting recs from MR.  Offered OV -- pt unable to come in today.  He does have a pending OV with MR on March 17.  MR, pls advise.  Thank you.  Also, pt states he has 5 tanks from [facility].  He is trying to get out of the house more now, so he is running out of o2 in the tanks sooner.  He requested to have more tanks provided by [facility] but was advised they would need an order from our office.  He is requesting another 4-5 tanks if possible.  I called Lenna Sciara, [phone], to see if there is a limit on the # of tanks and to see if anything else needs to be done other than an order.  She will check on this and will call back.  [facility]  Allergies verified with pt: Allergies  Allergen Reactions  . Amlodipine Besy-Benazepril Hcl Swelling    Lips swell  . Percocet [Oxycodone-Acetaminophen] Other (See Comments)    hallucinations

## 2013-10-13 NOTE — Telephone Encounter (Signed)
Melissa returned call. Please call back at - [phone]

## 2013-10-13 NOTE — Telephone Encounter (Signed)
Called spoke with Melissa.  Informed her that pt is trying to be more active outside the home and is running out of O2 with the 5 tanks at his home, and that pt would like to know if he can have an additional 4-5 tanks at time to equal a total of 9-10 tanks at a time.  Melissa stated that she is unsure if there is a limit to the number of tanks a patient can have a time and that we can go ahead and place the order for the additional tanks.  She does wonder if an order for an evaluation for portable O2 or conserving device by one of their RT's would be appropriate -- she is aware that this is a trach patient.  MR please advise if you would like pt to be evaluated for a portable conserving device.  He uses 4lpm continuously thru trach collar.  Thanks.

## 2013-10-13 NOTE — Telephone Encounter (Signed)
Pt is aware that we are still waiting on MR's response.  MR - please advise. Thanks.

## 2013-10-13 NOTE — Telephone Encounter (Signed)
Yes please evaluate for conserving device.  Dr. Brand Males, M.D., [facility].C.P Pulmonary and Critical Care Medicine Staff Physician [place] [facility] Pager: [phone], If no answer or between  15:00h - 7:00h: call [phone]  10/13/2013 5:30 PM

## 2013-10-14 NOTE — Telephone Encounter (Signed)
[email or web address].Jennifer Castillo, CMA  

## 2013-10-14 NOTE — Telephone Encounter (Signed)
Pt aware. Lovett Coffin, CMA  

## 2013-10-15 ENCOUNTER — Telehealth: Payer: Self-pay | Admitting: Internal Medicine

## 2013-10-15 MED ORDER — BUDESONIDE 0.5 MG/2ML IN SUSP: 0.5000 mg | Freq: Two times a day (BID) | RESPIRATORY_TRACT | Status: DC

## 2013-10-15 MED ORDER — ARFORMOTEROL TARTRATE 15 MCG/2ML IN NEBU: 15.0000 ug | INHALATION_SOLUTION | Freq: Two times a day (BID) | RESPIRATORY_TRACT | Status: DC

## 2013-10-15 NOTE — Telephone Encounter (Signed)
Called and spoke w/ pt. Aware rx's have been sent. Nothing further needed

## 2013-10-16 ENCOUNTER — Encounter (HOSPITAL_COMMUNITY)
Admission: RE | Admit: 2013-10-16 | Discharge: 2013-10-16 | Disposition: A | Discharge status: Home / Self Care | Payer: Medicare Other | Source: Ambulatory Visit | Attending: Nephrology | Admitting: Nephrology

## 2013-10-16 DIAGNOSIS — N039 Chronic nephritic syndrome with unspecified morphologic changes: Principal | ICD-10-CM

## 2013-10-16 DIAGNOSIS — D631 Anemia in chronic kidney disease: Secondary | ICD-10-CM | POA: Insufficient documentation

## 2013-10-16 DIAGNOSIS — N183 Chronic kidney disease, stage 3 unspecified: Secondary | ICD-10-CM | POA: Insufficient documentation

## 2013-10-16 LAB — IRON AND TIBC
IRON: 48 ug/dL (ref 42–135)
SATURATION RATIOS: 21 % (ref 20–55)
TIBC: 233 ug/dL (ref 215–435)
UIBC: 185 ug/dL (ref 125–400)

## 2013-10-16 LAB — RENAL FUNCTION PANEL
Albumin: 3.3 g/dL — ABNORMAL LOW (ref 3.5–5.2)
BUN: 36 mg/dL — AB (ref 6–23)
CALCIUM: 9.2 mg/dL (ref 8.4–10.5)
CO2: 33 meq/L — AB (ref 19–32)
Chloride: 100 mEq/L (ref 96–112)
Creatinine, Ser: 1.73 mg/dL — ABNORMAL HIGH (ref 0.50–1.35)
GFR calc non Af Amer: 41 mL/min — ABNORMAL LOW (ref >90)
GFR, EST AFRICAN AMERICAN: 47 mL/min — AB (ref >90)
Glucose, Bld: 157 mg/dL — ABNORMAL HIGH (ref 70–99)
PHOSPHORUS: 2.6 mg/dL (ref 2.3–4.6)
Potassium: 3.9 mEq/L (ref 3.7–5.3)
Sodium: 144 mEq/L (ref 137–147)

## 2013-10-16 LAB — POCT HEMOGLOBIN-HEMACUE: HEMOGLOBIN: 9.1 g/dL — AB (ref 13.0–17.0)

## 2013-10-16 MED ORDER — SODIUM CHLORIDE 0.9 % IV SOLN
1020.0000 mg | Freq: Once | INTRAVENOUS | Status: AC
  Administered 2013-10-16: 1020 mg via INTRAVENOUS
  Filled 2013-10-16: qty 34

## 2013-10-16 MED ORDER — DARBEPOETIN ALFA-POLYSORBATE 100 MCG/0.5ML IJ SOLN
INTRAMUSCULAR | Status: AC
  Administered 2013-10-16: 100 ug via SUBCUTANEOUS
  Filled 2013-10-16: qty 0.5

## 2013-10-16 MED ORDER — DARBEPOETIN ALFA-POLYSORBATE 500 MCG/ML IJ SOLN
100.0000 ug | INTRAMUSCULAR | Status: DC
  Filled 2013-10-16: qty 1

## 2013-10-17 LAB — FERRITIN: Ferritin: 458 ng/mL — ABNORMAL HIGH (ref 22–322)

## 2013-10-28 ENCOUNTER — Ambulatory Visit (INDEPENDENT_AMBULATORY_CARE_PROVIDER_SITE_OTHER): Payer: Medicare Other | Admitting: Internal Medicine

## 2013-10-28 ENCOUNTER — Encounter: Payer: Self-pay | Admitting: Internal Medicine

## 2013-10-28 VITALS — BP 168/90 | HR 88 | Ht 73.0 in | Wt 290.0 lb

## 2013-10-28 DIAGNOSIS — I5032 Chronic diastolic (congestive) heart failure: Secondary | ICD-10-CM

## 2013-10-28 DIAGNOSIS — J449 Chronic obstructive pulmonary disease, unspecified: Secondary | ICD-10-CM

## 2013-10-28 NOTE — Progress Notes (Signed)
Subjective:    Patient ID: [name] [name], male    DOB: Sep 24, 1950, 63 y.o.   MRN: [id number]  HPI 01/23/11- COPD, CAD/diastolic dysfunction, OSA, chronic respiratory failure, obesity Hypoventilation, cor pulmonale Hosp since last here- once for "shakes"-they told him wasn't using CPAP right, once for epistaxis, once to place urinary stent, and has had a right ? renal biopsy. Told anemic.  Breathing stable, but can't lie flat- feels smothered even on O2.  Runs O2 2-3L/M usually. I gave permision to go to 4 during exertion. Notes "tiresome" feeling midchest, related to exertion/ climbing stairs and relieved by rest. Told to pace himself and not try to push through that.  Discussed need for O2. He doesn't sleep well with his BiPAP 15/12. Discussed sleep hygiene- discussed room temperature. He was more comfortable with autotitration in hosp and we discussed a trial of that at home.  Has restarted diuretic since leg edema has come back.   05/25/11-  COPD, CAD/diastolic dysfunction, OSA, chronic respiratory failure, obesity Hypoventilation, cor pulmonale Went to ER last week- short of breath. Air wasn't satisfying. Was started back on shot for anemia. Feels some better.  Reports sneezing, postnasal drainage and a sense of mucus in his upper chest. Denies fever, sore throat and doesn't think he has a cold.  06/28/11-  COPD, CAD/diastolic dysfunction, OSA, chronic respiratory failure, obesity Hypoventilation, cor pulmonale Recently hospitalized October 25-29, notes reviewed with him and x-ray images reviewed by me. He was hospitalized for hypercapnic respiratory failure with transient encephalopathy and renal insufficiency. Since discharge he has regained some ankle edema while off of furosemide. He has a nephrology appointment later this month. He is concerned that he was taken off his diabetes medicines and is directed to discuss this with his primary physician immediately. He has some persistent  soreness across his anterior chest wall at the level of the xiphoid but otherwise breathing better with less exertional dyspnea and before he went in the hospital. He remains dependent on continuous oxygen at 2 L. He continues his BiPAP machine all night, every night but says he was sleeping better with it  on autotitration with a lower pressure used in the hospital.  09/25/11- COPD, CAD/diastolic dysfunction, OSA, chronic respiratory failure, obesity Hypoventilation, cor pulmonale Since last here he was hospitalized for respiratory failure with CHF and obesity hypoventilation. Better with diuresis. BiPAP 13/10 is now comfortable and used all night every night with supplemental oxygen 4 L. Tolerated colonoscopy without respiratory distress. Uses a rescue inhaler only occasionally but says it does help then.  11/23/11- COPD, CAD/diastolic dysfunction, OSA, chronic respiratory failure, obesity Hypoventilation, cor pulmonale She is noticing some increased shortness of breath on exertion such as getting in the car but no increase in ankle edema which is well controlled. Occasional cough is not progressive or productive. Noticing more rhinorrhea with the pollen. Continues BiPAP  I 13/E 10 all night every night.  02/07/12- COPD, CAD/diastolic dysfunction, OSA, chronic respiratory failure, obesity Hypoventilation, cor pulmonale  pt states doing some better.wakes up out of a nap coughing feels like he's choking.  Post Hosp- 01/14/12- 01/23/12-discharge diagnoses reviewed with him: Coronary artery disease/CABG, diastolic dysfunction, COPD with chronic respiratory failure, acute respiratory failure with hypoxia, acute on chronic renal failure, DM. He was intubated for 4 days and treated with Zosyn, vancomycin and Avelox, Levaquin, Zithromax and Rocephin. Cultures negative. He was to wean off of prednisone over 3 days. He was hoarse after intubation but that has resolved. He is  using oxygen 1-2 L and BiPAP  13/10 at  night. He feels he is pretty close to his baseline.  03/28/12-  COPD, CAD/diastolic dysfunction/ chronic CHF, OSA, chronic respiratory failure, obesity Hypoventilation, cor pulmonale  6 MWT today. Pt states breathing has been "okay" since last OV. Pt states having trouble falling alseep-not sleeping well on BiPAP. Pt states mask fit well but thinks the pressure needs to be adjusted again. Currently 13/10+ oxygen at 2 L/ Advanced. He sleeps better in a recliner with oxygen but without his BiPAP. Getting iron injections for anemia. We discussed shortness of breath and anemia. Pain left anterior axillary line described as a sore ache over the past week. He is able to raise his arm. Thinks he maybe lifted something wrong. COPD assessment test (CAT) 21/40 CXR 01/14/12- 1 view- IMPRESSION:  Stable support apparatus. Residual mild interstitial prominence  with slight improvement in aeration. No convincing pulmonary  edema. Probable small left pleural effusion with left basilar  atelectasis or infiltrate.  Original Report Authenticated By: [name], M.D.   [id number], CAD/diastolic dysfunction/ chronic CHF, OSA, chronic respiratory failure, obesity Hypoventilation, cor pulmonale Hospitalized again between March 9 and 12 for acute on chronic renal failure with congestive heart failure, chronic respiratory failure on BiPAP and acute respiratory failure with hypoxia. He says he is feeling better at this time. Continues oxygen 3 L/Advanced. He has been using BiPAP 13/10 but thinks he slept better with CPAP. We discussed options. Usually when he decompensates it is from fluid overload triggering CO2 retention and hypoventilation.  CXR 10/20/12 IMPRESSION:  Vascular congestion and cardiomegaly, with small bilateral pleural  effusions and bibasilar airspace opacification. Findings are most  compatible with recurrent pulmonary edema, though underlying  pneumonia cannot be excluded.  Original Report  Authenticated By: [name], M.D.  12/02/2012 [facility] follow up  Returns for a post hospital follow up .  Reports doing well overall but still having some SOB, thinks this may be due to his pressure not being high enough on BIPAP. Since decreasing BIPAP 1 month ago  down to 12/10 he does not feel as good. Trouble sleeping.  Was admitted for decompensated cor pulmonale w/ fluid overload with subsequent acute on chronic hypercarbic resp failure.  He admits he can not afford his advair and spiriva- therefore he is not taking.  He does not wear BIPAP everynight. We discussed the implications of this and untreated OHS/OSA. He naps a lot but does not wear BIPAP.  He was treated with diuresis with improvement.  Discharged on Lasix 40mg  . Lower dose due to bump in scr. Has OP follow up with Renal d/t renal insufficiency.   02/05/13- COPD, CAD/diastolic dysfunction/ chronic CHF, OSA, chronic respiratory failure, obesity Hypoventilation, cor pulmonale Hosp 6/16-6/19/14 Acute on Chronic Resp Failure Discharge Plan:  -pt educated on BiPAP compliance at home  -continue pulmicort, scheduled albuterol / atrovent (med cost has been an issue)  -wean prednisone to off  -given information for [employer] at [employer]. Pulmonary Rehab was a cost issue (45$ per visit)  -RN home health to review medications and reinforce need for BiPAP 12/12 wO2 2-3L for sleep, O2 3-4 awake.  FOLLOWS FOR: Breathing has improved since leaving the hospital. Reports DOE, chest tightness with exertion and slight coughing from time to time. Wife needs FMLA form. CXR 01/29/13 1View IMPRESSION:  Slightly improved aeration in the lungs may represent decreasing  edema.  Original Report Authenticated By: Markus Daft, M.  04/11/13- COPD, CAD/diastolic  dysfunction/ chronic CHF, OSA, chronic respiratory failure, obesity Hypoventilation, cor pulmonale FOLLOWS FOR: reports breathing is essentially unchanged but he feels like he's  "burning through more oxygen than I used to." denies wheezing, increased SOB, tightness, increased cough, f/c/s Just in hospital again acute on chronic respiratory failure with CHF and COPD. Today he feels well except for frontal headache. Continuing oxygen at 2.5 L. BIPAP Auto 5-15/ O2 2L/ Advanced CXR 1V 8/14 IMPRESSION:  1. Extubated, enteric tube removed. Stable lung volumes.  2. Stable ventilation. Small pleural effusions.  Electronically Signed  By: Lars Pinks  On: 04/02/2013 07:43  06/23/2013 [facility] follow up  Patient presents for a post hospital followup. Patient was admitted October 7 through October 30 for acute on chronic respiratory failure. Patient did require intubation for airway protection in the emergency room due to acute encephalopathy. Unfortunately, patient was unable to be weaned from the ventilator and underwent a tracheostomy. Patient was weaned to trach collar during the daytime and vent support. At bedtime. [name] [name] was placed by ENT Dr. Redmond Baseman. He was transitioned to an extra long #6 trach prior to discharge.  Patient returns today accompanied by his wife. Patient reports that he has been doing okay. He strength. He is tolerating vent support at night. However, he does feel that this is uncomfortable in certain times. Vent settings at night are PRVC at a rate of 14 with a tidal volume of 450 cc with FiO2 of 40%, and a PEEP of 5. Patient has been using Passy-Muir valve for speech however, over the last couple days, feels it's been more difficulties than his usual. He does have follow with ENT later this week for trach evaluation. Patient denies any hemoptysis, trach site. Bleeding, Orthopnea, PND, or increased leg swelling. Has been eating okay without any noted , dysphagia. Appetite is decreased with no nausea, vomiting, diarrhea.  REC Continue on current regimen.  follow up with ENT this week for [facility] evaluation.  I will call with labs and xray results.   Continue on Trach Collar during daytime  Continue on Vent support At bedtime   Follow up Dr. Chase Caller in 6-8 weeks and As needed .  Please contact office for sooner follow up if symptoms do not improve or worsen or seek emergency care   Late Add : ? Right sided infiltrate  Add Levaquin 500mg  daily x 7 days #7 , no refills.    OV 07/21/2013 Followup chronic respiratory failure status post tracheostomy. Since seeing my nurse practitioner a month ago he is doing well. Uses ventilator at night and daytime is on tracheostomy collar. He is not compliant with his Passy-Muir valve because it gives him a sensation of suffocation. But he is able to talk with open tracheostomy. He still reports chronic stable dyspnea on exertion for walking a block or sometimes half a block only. This is relieved by rest. Rated as moderate. There is no associated chest pain no worsening cough or phlegm. He did have some hemoptysis yesterday and he is trying to get in contact with the ENT specialist. As noted just beside bleeding. He denies any orthopnea or paroxysmal nocturnal dyspnea. Eating okay.  Lab review I do note that is not had a CT scan of the chest since 2012 rec Glad you are doing well CMA will refer you to pumonary rehab on basis of chronic resp failure Please do CT scan chest for pleural fibrosis, pleural plaques Followup with ENT> Dr Redmond Baseman, changed trach on 12/15  07/31/2013  f/u ov/Wert re: worse since 07/24/13 (day of CT Chest - see below) Chief Complaint  Patient presents with  . Acute Visit    Pt c/o increased SOB for the past several days. He states gets out of breath with any sort of exertion.  He has minimal cough that is non prod.   mucus is clear/ not bloody Baseline use of alb not much at all,  One every few days at most Assoc with increased fluid in legs  But followed by Gwenlyn Found who adjusts his lasix  No obvious patterns in day to day or daytime variabilty or assoc chronic cough or cp or  chest tightness, subjective wheeze overt sinus or hb symptoms. No unusual exp hx or h/o childhood pna/ asthma or knowledge of premature birth.  Sleeping ok without nocturnal  or early am exacerbation  of respiratory  c/o's or need for noct saba. Also denies any obvious fluctuation of symptoms with weather or environmental changes or other aggravating or alleviating factors except as outlined above    09/15/2013 Follow up  Returns for a 2 month follow up for COPD, chronic resp failure -trach dependent.  Reports breathing has worsened x2 weeks w/ increased SOB, chest tightness, cough with white mucus. Coughing and congestion make it difficult to use vent at night . Denies any f/c/s, nausea, vomiting, hemoptysis, edema.  Was referred to pulmonary rehab however could not afford co-pay amounts. We discussed looking into pt assistance.    OV 10/28/2013  Chief Complaint  Patient presents with  . Follow-up    Pt is c/o rattling in his chest, and dry cough. Pt also c/o increased swelling in both ankles.     Followup chronic respiratory failure status post tracheostomy by ENT. Chronic respiratory failure on account of COPD, OSA, diastolic heart failure  - Overall he is stable. He reports using the ventilator at night but occasionally he is noncompliant. When he does not use the ventilator the next day he is more fatigued according to his wife. In the daytime he is on trach collar on room air of liters of oxygen. He occasionally uses his Passy-Muir valve for talk but otherwise feels a sense of suffocation. The last few weeks he's noticed some increased pedal edema and despite increasing and self titrating his Lasix. In association with this he is more rattling in his chest and more shortness of breath and cough but there is no change in sputum color. The sputum is not pink and frothy. He has not seen his cardiologist Dr. Eli Phillips a while and he plans to see him more expeditiously. Denies any chest pain  cough fever chills or wheezing.  Review of Systems  Constitutional: Negative for fever and unexpected weight change.  HENT: Negative for congestion, dental problem, ear pain, nosebleeds, postnasal drip, rhinorrhea, sinus pressure, sneezing, sore throat and trouble swallowing.   Eyes: Negative for redness and itching.  Respiratory: Positive for cough and shortness of breath. Negative for chest tightness and wheezing.   Cardiovascular: Negative for palpitations and leg swelling.  Gastrointestinal: Negative for nausea and vomiting.  Genitourinary: Negative for dysuria.  Musculoskeletal: Negative for joint swelling.  Skin: Negative for rash.  Neurological: Negative for headaches.  Hematological: Does not bruise/bleed easily.  Psychiatric/Behavioral: Negative for dysphoric mood. The patient is not nervous/anxious.        Objective:   Physical Exam  Nursing note and vitals reviewed. Constitutional: He is oriented to person, place, and time. He appears well-developed and well-nourished. No  distress.  Body mass index is 38.27 kg/(m^2).   HENT:  Head: Normocephalic and atraumatic.  Right Ear: External ear normal.  Left Ear: External ear normal.  Mouth/Throat: Oropharynx is clear and moist. No oropharyngeal exudate.  Trach +. Speaking through [place]  Eyes: Conjunctivae and EOM are normal. Pupils are equal, round, and reactive to light. Right eye exhibits no discharge. Left eye exhibits no discharge. No scleral icterus.  Neck: Normal range of motion. Neck supple. No JVD present. No tracheal deviation present. No thyromegaly present.  Cardiovascular: Normal rate, regular rhythm and intact distal pulses.  Exam reveals no gallop and no friction rub.   No murmur heard. Pulmonary/Chest: Effort normal and breath sounds normal. No respiratory distress. He has no wheezes. He has no rales. He exhibits no tenderness.  Abdominal: Soft. Bowel sounds are normal. He exhibits no distension and no mass. There  is no tenderness. There is no rebound and no guarding.  Musculoskeletal: Normal range of motion. He exhibits edema. He exhibits no tenderness.  ++ edema  Lymphadenopathy:    He has no cervical adenopathy.  Neurological: He is alert and oriented to person, place, and time. He has normal reflexes. No cranial nerve deficit. Coordination normal.  Skin: Skin is warm and dry. No rash noted. He is not diaphoretic. No erythema. No pallor.  Psychiatric: He has a normal mood and affect. His behavior is normal. Judgment and thought content normal.          Assessment & Plan:

## 2013-10-28 NOTE — Patient Instructions (Signed)
Copd appears stable However, diastolic chf might be worse My office will help you seen by Dr Gwenlyn Found office next day or two  REturn to see me in 4 months or sooner if needed  - continue trach care through ENT clinic

## 2013-10-30 ENCOUNTER — Encounter: Payer: Self-pay | Admitting: Cardiology

## 2013-10-30 ENCOUNTER — Ambulatory Visit (INDEPENDENT_AMBULATORY_CARE_PROVIDER_SITE_OTHER): Payer: Medicare Other | Admitting: Cardiology

## 2013-10-30 ENCOUNTER — Encounter (HOSPITAL_COMMUNITY)
Admission: RE | Admit: 2013-10-30 | Discharge: 2013-10-30 | Disposition: A | Discharge status: Home / Self Care | Payer: Medicare Other | Source: Ambulatory Visit | Attending: Nephrology | Admitting: Nephrology

## 2013-10-30 VITALS — BP 150/80 | HR 87 | Ht 73.0 in | Wt 288.0 lb

## 2013-10-30 DIAGNOSIS — I119 Hypertensive heart disease without heart failure: Secondary | ICD-10-CM

## 2013-10-30 DIAGNOSIS — E678 Other specified hyperalimentation: Secondary | ICD-10-CM

## 2013-10-30 DIAGNOSIS — J449 Chronic obstructive pulmonary disease, unspecified: Secondary | ICD-10-CM

## 2013-10-30 DIAGNOSIS — N183 Chronic kidney disease, stage 3 unspecified: Secondary | ICD-10-CM

## 2013-10-30 DIAGNOSIS — I2510 Atherosclerotic heart disease of native coronary artery without angina pectoris: Secondary | ICD-10-CM

## 2013-10-30 DIAGNOSIS — J4489 Other specified chronic obstructive pulmonary disease: Secondary | ICD-10-CM

## 2013-10-30 DIAGNOSIS — I509 Heart failure, unspecified: Secondary | ICD-10-CM

## 2013-10-30 DIAGNOSIS — G4733 Obstructive sleep apnea (adult) (pediatric): Secondary | ICD-10-CM

## 2013-10-30 DIAGNOSIS — I5032 Chronic diastolic (congestive) heart failure: Secondary | ICD-10-CM

## 2013-10-30 LAB — POCT HEMOGLOBIN-HEMACUE: Hemoglobin: 9.3 g/dL — ABNORMAL LOW (ref 13.0–17.0)

## 2013-10-30 MED ORDER — FUROSEMIDE 40 MG PO TABS: 40.0000 mg | ORAL_TABLET | Freq: Every day | ORAL | Status: DC

## 2013-10-30 MED ORDER — DARBEPOETIN ALFA-POLYSORBATE 500 MCG/ML IJ SOLN
100.0000 ug | INTRAMUSCULAR | Status: DC
  Filled 2013-10-30: qty 1

## 2013-10-30 MED ORDER — DARBEPOETIN ALFA-POLYSORBATE 100 MCG/0.5ML IJ SOLN
INTRAMUSCULAR | Status: AC
  Administered 2013-10-30: 100 ug via SUBCUTANEOUS
  Filled 2013-10-30: qty 0.5

## 2013-10-30 NOTE — Progress Notes (Signed)
10/30/2013 [name] [name]   1951-07-16  [id number]  Primary Physicia Ron Parker, MD Primary Cardiologist: Dr Gwenlyn Found  HPI:  63 y/o with chronic respiratory failure referred here by Dr Chase Caller for suspected acute on chronic diastolic CHF. The pt has chronic obesity and hypoventilation syndrome. He has a trach and is on Bipap. Last echo in Oct 2014 showed an EF of 55-60% with grade 1 diastolic dysfunction. He says he has had some increased swelling over the past two weeks. He occasional takes an extra Lasix with improvement. He denies any increased dyspnea.    Current Outpatient Prescriptions  Medication Sig Dispense Refill  . acetaminophen (TYLENOL) 160 MG/5ML solution Take 20.3 mLs (650 mg total) by mouth every 4 (four) hours as needed for fever.  120 mL  0  . albuterol (PROVENTIL) (2.5 MG/3ML) 0.083% nebulizer solution Take 3 mLs (2.5 mg total) by nebulization every 4 (four) hours as needed for wheezing.  75 mL  0  . allopurinol (ZYLOPRIM) 100 MG tablet Take 1 tablet by mouth daily.      Marland Kitchen arformoterol (BROVANA) 15 MCG/2ML NEBU Take 2 mLs (15 mcg total) by nebulization 2 (two) times daily.  120 mL  1  . aspirin 81 MG chewable tablet Chew 1 tablet (81 mg total) by mouth daily.      . budesonide (PULMICORT) 0.5 MG/2ML nebulizer solution Take 2 mLs (0.5 mg total) by nebulization 2 (two) times daily.  120 mL  1  . COLCRYS 0.6 MG tablet Take 0.6 mg by mouth daily.       . nitroGLYCERIN (NITROSTAT) 0.4 MG SL tablet Place 1 tablet (0.4 mg total) under the tongue every 5 (five) minutes as needed. For chest pain  25 tablet  6  . traMADol (ULTRAM) 50 MG tablet Take 1 tablet (50 mg total) by mouth every 6 (six) hours as needed for pain.    0  . furosemide (LASIX) 40 MG tablet Take 1 tablet (40 mg total) by mouth daily.  90 tablet  3   No current facility-administered medications for this visit.   Facility-Administered Medications Ordered in Other Visits  Medication Dose Route Frequency  Provider Last Rate Last Dose  . darbepoetin alfa-polysorbate (ARANESP) injection 40 mcg  40 mcg Subcutaneous Q14 Days Donetta Potts, MD   40 mcg at 06/19/11 1345    Allergies  Allergen Reactions  . Amlodipine Besy-Benazepril Hcl Swelling    Lips swell  . Percocet [Oxycodone-Acetaminophen] Other (See Comments)    hallucinations    History   Social History  . Marital Status: Married    Spouse Name: N/A    Number of Children: N/A  . Years of Education: N/A   Occupational History  . Not on file.   Social History Main Topics  . Smoking status: Former Smoker -- 1.00 packs/day for 25 years    Types: Cigarettes    Quit date: 08/14/1998  . Smokeless tobacco: Never Used  . Alcohol Use: Yes     Comment: social  . Drug Use: No  . Sexual Activity: Yes   Other Topics Concern  . Not on file   Social History Narrative  . No narrative on file     Review of Systems: General: negative for chills, fever, night sweats or weight changes.  Cardiovascular: negative for chest pain, dyspnea on exertion, edema, orthopnea, palpitations, paroxysmal nocturnal dyspnea or shortness of breath Dermatological: negative for rash Respiratory: negative for cough or wheezing Urologic: negative for hematuria  Abdominal: negative for nausea, vomiting, diarrhea, bright red blood per rectum, melena, or hematemesis Neurologic: negative for visual changes, syncope, or dizziness All other systems reviewed and are otherwise negative except as noted above.    Blood pressure 150/80, pulse 87, height 6\' 1"  (1.854 m), weight 288 lb (130.636 kg).  General appearance: alert, cooperative, no distress, morbidly obese and trach, O2 Lungs: decreased breath sounds bilat Heart: regular rate and rhythm and 2/6 systolic murmur at LSB Extremities: 1+ edema  EKG NSR RBBB  ASSESSMENT AND PLAN:   Chronic diastolic CHF (congestive heart failure) He says his wgt is up 20 lbs, no increased dyspnea but some increased  LE edema  Chronic renal insufficiency, stage III (moderate) Last SCr 1.7  CORONARY ARTERY DISEASE, CABG 2001, last PCI 7/11 .  OBESITY HYPOVENTILATION SYNDROME Trach  Obstructive sleep apnea .  Hypertensive heart disease .  C O P D Cor pulmonale   PLAN  Increase Lasix to 40 mg daily, check in two weeks with BMP.  Mindee Robledo KPA-C 10/30/2013 10:58 AM

## 2013-10-30 NOTE — Assessment & Plan Note (Signed)
He says his wgt is up 20 lbs, no increased dyspnea but some increased LE edema

## 2013-10-30 NOTE — Assessment & Plan Note (Signed)
Last SCr 1.7 

## 2013-10-30 NOTE — Assessment & Plan Note (Addendum)
Trach

## 2013-10-30 NOTE — Assessment & Plan Note (Signed)
Cor pulmonale

## 2013-10-30 NOTE — Patient Instructions (Signed)
Increase Lasix to 40 mg daily Return in two weeks for follow up

## 2013-11-01 NOTE — Assessment & Plan Note (Signed)
Copd appears stable However, diastolic chf might be worse My office will help you seen by Dr Gwenlyn Found office next day or two  REturn to see me in 4 months or sooner if needed  - continue trach care through ENT clinic

## 2013-11-12 ENCOUNTER — Ambulatory Visit (INDEPENDENT_AMBULATORY_CARE_PROVIDER_SITE_OTHER): Payer: Medicare Other | Admitting: Cardiology

## 2013-11-12 ENCOUNTER — Encounter: Payer: Self-pay | Admitting: Cardiology

## 2013-11-12 VITALS — BP 150/80 | HR 80 | Ht 73.0 in | Wt 289.0 lb

## 2013-11-12 DIAGNOSIS — I5032 Chronic diastolic (congestive) heart failure: Secondary | ICD-10-CM

## 2013-11-12 DIAGNOSIS — E678 Other specified hyperalimentation: Secondary | ICD-10-CM

## 2013-11-12 DIAGNOSIS — N183 Chronic kidney disease, stage 3 unspecified: Secondary | ICD-10-CM

## 2013-11-12 DIAGNOSIS — I2510 Atherosclerotic heart disease of native coronary artery without angina pectoris: Secondary | ICD-10-CM

## 2013-11-12 DIAGNOSIS — I509 Heart failure, unspecified: Secondary | ICD-10-CM

## 2013-11-12 DIAGNOSIS — G4733 Obstructive sleep apnea (adult) (pediatric): Secondary | ICD-10-CM

## 2013-11-12 LAB — BASIC METABOLIC PANEL
BUN: 33 mg/dL — ABNORMAL HIGH (ref 6–23)
CO2: 34 mEq/L — ABNORMAL HIGH (ref 19–32)
Calcium: 8.8 mg/dL (ref 8.4–10.5)
Chloride: 102 mEq/L (ref 96–112)
Creat: 1.73 mg/dL — ABNORMAL HIGH (ref 0.50–1.35)
Glucose, Bld: 103 mg/dL — ABNORMAL HIGH (ref 70–99)
Potassium: 4.9 mEq/L (ref 3.5–5.3)
Sodium: 144 mEq/L (ref 135–145)

## 2013-11-12 MED ORDER — METOLAZONE 2.5 MG PO TABS: ORAL_TABLET | ORAL | Status: DC

## 2013-11-12 NOTE — Patient Instructions (Signed)
Take Metolazone 2.5 mg three times a week 30 minutes before Lasix Return in 3 weeks

## 2013-11-12 NOTE — Assessment & Plan Note (Signed)
Last SCr 1.73, Followed by Dr Arty Baumgartner

## 2013-11-12 NOTE — Assessment & Plan Note (Signed)
No angina 

## 2013-11-12 NOTE — Assessment & Plan Note (Signed)
He is seen in follow up after his Lasix was increased to 40 mg daily.

## 2013-11-12 NOTE — Assessment & Plan Note (Signed)
S/P trach, followed by Dr Lynford Citizen

## 2013-11-12 NOTE — Assessment & Plan Note (Addendum)
BiPAP auto 5-15 with O2  L Advanced.

## 2013-11-12 NOTE — Progress Notes (Signed)
11/12/2013 [name] [name]   [birth year]  [id number]  Primary [place], [place] Primary Cardiologist: Dr Gwenlyn Found  HPI:  [name] [name] is here for follow up of his acute on chronic diastolic CHF. The pt has chronic obesity and hypoventilation syndrome. He has a trach and is on Bipap. Last echo in Oct 2014 showed an EF of 55-60% with grade 1 diastolic dysfunction. He says he has had some increased swelling over the past two weeks. He occasional takes an extra Lasix with improvement. He denies any increased dyspnea. I increased his daily Lasix to 40 mg and he is here for follow up. His main complaint is lower extremity edema. He has has some improvement since his Lasix was increased. His wgt has not changed.     Current Outpatient Prescriptions  Medication Sig Dispense Refill  . acetaminophen (TYLENOL) 160 MG/5ML solution Take 20.3 mLs (650 mg total) by mouth every 4 (four) hours as needed for fever.  120 mL  0  . albuterol (PROVENTIL) (2.5 MG/3ML) 0.083% nebulizer solution Take 3 mLs (2.5 mg total) by nebulization every 4 (four) hours as needed for wheezing.  75 mL  0  . allopurinol (ZYLOPRIM) 100 MG tablet Take 1 tablet by mouth daily.      Marland Kitchen arformoterol (BROVANA) 15 MCG/2ML NEBU Take 2 mLs (15 mcg total) by nebulization 2 (two) times daily.  120 mL  1  . aspirin 81 MG chewable tablet Chew 1 tablet (81 mg total) by mouth daily.      . budesonide (PULMICORT) 0.5 MG/2ML nebulizer solution Take 2 mLs (0.5 mg total) by nebulization 2 (two) times daily.  120 mL  1  . COLCRYS 0.6 MG tablet Take 0.6 mg by mouth daily.       . furosemide (LASIX) 40 MG tablet Take 1 tablet (40 mg total) by mouth daily.  90 tablet  3  . nitroGLYCERIN (NITROSTAT) 0.4 MG SL tablet Place 1 tablet (0.4 mg total) under the tongue every 5 (five) minutes as needed. For chest pain  25 tablet  6  . traMADol (ULTRAM) 50 MG tablet Take 1 tablet (50 mg total) by mouth every 6 (six) hours as needed for pain.    0  .  metolazone (ZAROXOLYN) 2.5 MG tablet Take 2.5 mg M/W/F 30 minutes before Lasix  30 tablet  3   No current facility-administered medications for this visit.   Facility-Administered Medications Ordered in Other Visits  Medication Dose Route Frequency Provider Last Rate Last Dose  . darbepoetin alfa-polysorbate (ARANESP) injection 40 mcg  40 mcg Subcutaneous Q14 Days Donetta Potts, MD   40 mcg at 06/19/11 1345    Allergies  Allergen Reactions  . Amlodipine Besy-Benazepril Hcl Swelling    Lips swell  . Percocet [Oxycodone-Acetaminophen] Other (See Comments)    hallucinations    History   Social History  . Marital Status: Married    Spouse Name: N/A    Number of Children: N/A  . Years of Education: N/A   Occupational History  . Not on file.   Social History Main Topics  . Smoking status: Former Smoker -- 1.00 packs/day for 25 years    Types: Cigarettes    Quit date: 08/14/1998  . Smokeless tobacco: Never Used  . Alcohol Use: Yes     Comment: social  . Drug Use: No  . Sexual Activity: Yes   Other Topics Concern  . Not on file   Social History Narrative  .  No narrative on file     Review of Systems: General: negative for chills, fever, night sweats or weight changes.  Cardiovascular: negative for chest pain, dyspnea on exertion, edema, orthopnea, palpitations, paroxysmal nocturnal dyspnea or shortness of breath Dermatological: negative for rash Respiratory: negative for cough or wheezing Urologic: negative for hematuria Abdominal: negative for nausea, vomiting, diarrhea, bright red blood per rectum, melena, or hematemesis Neurologic: negative for visual changes, syncope, or dizziness All other systems reviewed and are otherwise negative except as noted above.    Blood pressure 150/80, pulse 80, height 6\' 1"  (1.854 m), weight 289 lb (131.09 kg).  General appearance: alert, cooperative, no distress and moderately obese Neck: Trach in place Lungs: decreased  breath sounds in both lun fields Heart: regular rate and rhythm and soft systolic murmur Extremities: 2-3+ bilat LE edema   ASSESSMENT AND PLAN:   Chronic diastolic CHF (congestive heart failure) He is seen in follow up after his Lasix was increased to 40 mg daily.  Chronic renal insufficiency, stage III (moderate) Last SCr 1.73, Followed by Dr Arty Baumgartner  OBESITY HYPOVENTILATION SYNDROME S/P trach, followed by Dr Lynford Citizen  CORONARY ARTERY DISEASE, CABG 2001, last PCI 7/11 No angina  Obstructive sleep apnea BiPAP auto 5-15 with O2  L Advanced.    PLAN  I added Zaroxolyn 2.5 mg M/W/F to his current medications. He'll get a BMP today. I'll follow him up in three weeks. I suspect he is at least 10 lbs volume overloaded.   [name] [name] KPA-C 11/12/2013 11:31 AM

## 2013-11-13 ENCOUNTER — Inpatient Hospital Stay (HOSPITAL_COMMUNITY): Admission: RE | Admit: 2013-11-13 | Payer: Medicare Other | Source: Ambulatory Visit

## 2013-11-19 ENCOUNTER — Encounter (HOSPITAL_COMMUNITY)
Admission: RE | Admit: 2013-11-19 | Discharge: 2013-11-19 | Disposition: A | Discharge status: Home / Self Care | Payer: Medicare Other | Source: Ambulatory Visit | Attending: Nephrology | Admitting: Nephrology

## 2013-11-19 DIAGNOSIS — D631 Anemia in chronic kidney disease: Secondary | ICD-10-CM | POA: Insufficient documentation

## 2013-11-19 DIAGNOSIS — N039 Chronic nephritic syndrome with unspecified morphologic changes: Principal | ICD-10-CM

## 2013-11-19 DIAGNOSIS — N183 Chronic kidney disease, stage 3 unspecified: Secondary | ICD-10-CM | POA: Insufficient documentation

## 2013-11-19 LAB — RENAL FUNCTION PANEL
ALBUMIN: 3.4 g/dL — AB (ref 3.5–5.2)
BUN: 48 mg/dL — ABNORMAL HIGH (ref 6–23)
CO2: 34 mEq/L — ABNORMAL HIGH (ref 19–32)
Calcium: 9.6 mg/dL (ref 8.4–10.5)
Chloride: 95 mEq/L — ABNORMAL LOW (ref 96–112)
Creatinine, Ser: 1.71 mg/dL — ABNORMAL HIGH (ref 0.50–1.35)
GFR calc non Af Amer: 41 mL/min — ABNORMAL LOW (ref >90)
GFR, EST AFRICAN AMERICAN: 48 mL/min — AB (ref >90)
GLUCOSE: 113 mg/dL — AB (ref 70–99)
PHOSPHORUS: 3.5 mg/dL (ref 2.3–4.6)
POTASSIUM: 4.4 meq/L (ref 3.7–5.3)
SODIUM: 141 meq/L (ref 137–147)

## 2013-11-19 LAB — POCT HEMOGLOBIN-HEMACUE: Hemoglobin: 10.2 g/dL — ABNORMAL LOW (ref 13.0–17.0)

## 2013-11-19 MED ORDER — HEPARIN SOD (PORK) LOCK FLUSH 100 UNIT/ML IV SOLN
INTRAVENOUS | Status: AC
  Filled 2013-11-19: qty 5

## 2013-11-19 MED ORDER — DARBEPOETIN ALFA-POLYSORBATE 100 MCG/0.5ML IJ SOLN
INTRAMUSCULAR | Status: AC
  Filled 2013-11-19: qty 0.5

## 2013-11-19 MED ORDER — DARBEPOETIN ALFA-POLYSORBATE 100 MCG/0.5ML IJ SOLN
100.0000 ug | INTRAMUSCULAR | Status: DC
  Administered 2013-11-19: 100 ug via SUBCUTANEOUS

## 2013-12-03 ENCOUNTER — Ambulatory Visit (INDEPENDENT_AMBULATORY_CARE_PROVIDER_SITE_OTHER): Payer: Medicare Other | Admitting: Cardiology

## 2013-12-03 ENCOUNTER — Encounter: Payer: Self-pay | Admitting: Cardiology

## 2013-12-03 VITALS — BP 132/76 | HR 84 | Ht 73.0 in | Wt 285.0 lb

## 2013-12-03 DIAGNOSIS — I509 Heart failure, unspecified: Secondary | ICD-10-CM

## 2013-12-03 DIAGNOSIS — E1159 Type 2 diabetes mellitus with other circulatory complications: Secondary | ICD-10-CM

## 2013-12-03 DIAGNOSIS — N183 Chronic kidney disease, stage 3 unspecified: Secondary | ICD-10-CM

## 2013-12-03 DIAGNOSIS — E678 Other specified hyperalimentation: Secondary | ICD-10-CM

## 2013-12-03 DIAGNOSIS — I798 Other disorders of arteries, arterioles and capillaries in diseases classified elsewhere: Secondary | ICD-10-CM

## 2013-12-03 DIAGNOSIS — I2510 Atherosclerotic heart disease of native coronary artery without angina pectoris: Secondary | ICD-10-CM

## 2013-12-03 DIAGNOSIS — I5032 Chronic diastolic (congestive) heart failure: Secondary | ICD-10-CM

## 2013-12-03 LAB — BASIC METABOLIC PANEL
BUN: 43 mg/dL — ABNORMAL HIGH (ref 6–23)
CO2: 39 mEq/L — ABNORMAL HIGH (ref 19–32)
Calcium: 9 mg/dL (ref 8.4–10.5)
Chloride: 93 mEq/L — ABNORMAL LOW (ref 96–112)
Creat: 1.61 mg/dL — ABNORMAL HIGH (ref 0.50–1.35)
Glucose, Bld: 115 mg/dL — ABNORMAL HIGH (ref 70–99)
Potassium: 4.2 mEq/L (ref 3.5–5.3)
Sodium: 139 mEq/L (ref 135–145)

## 2013-12-03 NOTE — Assessment & Plan Note (Signed)
Here for follow up. His home wgt is down to 283. He is doing better.

## 2013-12-03 NOTE — Assessment & Plan Note (Signed)
S/P trach, Dr Chase Caller follows.

## 2013-12-03 NOTE — Patient Instructions (Addendum)
Lab today Follow up with Dr Gwenlyn Found 6 months. If your wgt gets down to 275- stop Metolazone.

## 2013-12-03 NOTE — Progress Notes (Signed)
12/03/2013 [name] [name]   Jul 26, 1951  [id number]  Primary Physicia Ron [place], [place] Primary Cardiologist: Dr Gwenlyn Found  HPI:  63 y/o obese AA male with chronic respiratory failure referred by to [place] Dr Chase Caller for suspected acute on chronic diastolic CHF on 6/38/75. The pt has chronic obesity and hypoventilation syndrome. He has a trach and is on Bipap. Last echo in Oct 2014 showed an EF of 55-60% with grade 1 diastolic dysfunction. He has a history of CAD and is S/P CABG 2001, his last PCI was in July 2011. Myoview Feb 2014 was low risk.          He says he has had some increased swelling over the past two weeks. He occasional takes an extra Lasix with improvement. He denies any increased dyspnea. His wgt was 288 at that visit. I added Metolazone and increased his daily Lasix to 40 mg. He is here for follow up. Since his LOV he has been doing well. His edema is still present but better. He denies any increased DOE. His wgt at home is 283.     Current Outpatient Prescriptions  Medication Sig Dispense Refill  . acetaminophen (TYLENOL) 160 MG/5ML solution Take 20.3 mLs (650 mg total) by mouth every 4 (four) hours as needed for fever.  120 mL  0  . albuterol (PROVENTIL) (2.5 MG/3ML) 0.083% nebulizer solution Take 3 mLs (2.5 mg total) by nebulization every 4 (four) hours as needed for wheezing.  75 mL  0  . allopurinol (ZYLOPRIM) 100 MG tablet Take 1 tablet by mouth daily.      Marland Kitchen arformoterol (BROVANA) 15 MCG/2ML NEBU Take 2 mLs (15 mcg total) by nebulization 2 (two) times daily.  120 mL  1  . aspirin 81 MG chewable tablet Chew 1 tablet (81 mg total) by mouth daily.      . budesonide (PULMICORT) 0.5 MG/2ML nebulizer solution Take 2 mLs (0.5 mg total) by nebulization 2 (two) times daily.  120 mL  1  . COLCRYS 0.6 MG tablet Take 0.6 mg by mouth daily.       . furosemide (LASIX) 40 MG tablet Take 1 tablet (40 mg total) by mouth daily.  90 tablet  3  . metolazone (ZAROXOLYN) 2.5 MG tablet Take  2.5 mg M/W/F 30 minutes before Lasix  30 tablet  3  . nitroGLYCERIN (NITROSTAT) 0.4 MG SL tablet Place 1 tablet (0.4 mg total) under the tongue every 5 (five) minutes as needed. For chest pain  25 tablet  6  . traMADol (ULTRAM) 50 MG tablet Take 1 tablet (50 mg total) by mouth every 6 (six) hours as needed for pain.    0   No current facility-administered medications for this visit.   Facility-Administered Medications Ordered in Other Visits  Medication Dose Route Frequency Provider Last Rate Last Dose  . darbepoetin alfa-polysorbate (ARANESP) injection 40 mcg  40 mcg Subcutaneous Q14 Days Donetta Potts, MD   40 mcg at 06/19/11 1345    Allergies  Allergen Reactions  . Amlodipine Besy-Benazepril Hcl Swelling    Lips swell  . Percocet [Oxycodone-Acetaminophen] Other (See Comments)    hallucinations    History   Social History  . Marital Status: Married    Spouse Name: N/A    Number of Children: N/A  . Years of Education: N/A   Occupational History  . Not on file.   Social History Main Topics  . Smoking status: Former Smoker -- 1.00 packs/day for 25  years    Types: Cigarettes    Quit date: 08/14/1998  . Smokeless tobacco: Never Used  . Alcohol Use: Yes     Comment: social  . Drug Use: No  . Sexual Activity: Yes   Other Topics Concern  . Not on file   Social History Narrative  . No narrative on file     Review of Systems: General: negative for chills, fever, night sweats or weight changes.  Cardiovascular: negative for chest pain, orthopnea, palpitations, paroxysmal nocturnal dyspnea or shortness of breath Dermatological: negative for rash Respiratory: negative for cough or wheezing Urologic: negative for hematuria Abdominal: negative for nausea, vomiting, diarrhea, bright red blood per rectum, melena, or hematemesis Neurologic: negative for visual changes, syncope, or dizziness All other systems reviewed and are otherwise negative except as noted  above.    Blood pressure 132/76, pulse 84, height 6\' 1"  (1.854 m), weight 285 lb (129.275 kg).  General appearance: alert, cooperative, no distress, morbidly obese and Trach Lungs: decreased at both bases Heart: regular rate and rhythm Extremities: bilat 2+ LE edema    ASSESSMENT AND PLAN:   Chronic diastolic CHF (congestive heart failure) Here for follow up. His home wgt is down to 283. He is doing better.  Chronic renal insufficiency, stage III (moderate) Followed by Dr Arty Baumgartner  CORONARY ARTERY DISEASE, CABG 2001, last PCI 7/11 Stable, low risk Myoview Feb 2014, no angina  OBESITY HYPOVENTILATION SYNDROME S/P trach, Dr Chase Caller follows.   Type 2 diabetes mellitus with vascular disease Type 2   PLAN  I suggested he continue his current medications. If his wgt gets down to 275 I suggested he stop his Metolazone. He is scheduled to see Dr Charlyne Petrin in follow up. He can see Dr Gwenlyn Found in 6 months.   Doreene Burke [facility] 12/03/2013 11:03 AM

## 2013-12-03 NOTE — Assessment & Plan Note (Signed)
Followed by Dr Arty Baumgartner

## 2013-12-03 NOTE — Assessment & Plan Note (Signed)
Type 2

## 2013-12-03 NOTE — Assessment & Plan Note (Signed)
Stable, low risk Myoview Feb 2014, no angina

## 2013-12-10 ENCOUNTER — Encounter (HOSPITAL_COMMUNITY)
Admission: RE | Admit: 2013-12-10 | Discharge: 2013-12-10 | Disposition: A | Discharge status: Home / Self Care | Payer: Medicare Other | Source: Ambulatory Visit | Attending: Nephrology | Admitting: Nephrology

## 2013-12-10 LAB — IRON AND TIBC
Iron: 70 ug/dL (ref 42–135)
SATURATION RATIOS: 31 % (ref 20–55)
TIBC: 225 ug/dL (ref 215–435)
UIBC: 155 ug/dL (ref 125–400)

## 2013-12-10 LAB — FERRITIN: Ferritin: 503 ng/mL — ABNORMAL HIGH (ref 22–322)

## 2013-12-10 LAB — POCT HEMOGLOBIN-HEMACUE: Hemoglobin: 9.8 g/dL — ABNORMAL LOW (ref 13.0–17.0)

## 2013-12-10 MED ORDER — DARBEPOETIN ALFA-POLYSORBATE 500 MCG/ML IJ SOLN
100.0000 ug | INTRAMUSCULAR | Status: DC
  Filled 2013-12-10: qty 1

## 2013-12-10 MED ORDER — DARBEPOETIN ALFA-POLYSORBATE 100 MCG/0.5ML IJ SOLN
INTRAMUSCULAR | Status: AC
  Administered 2013-12-10: 100 ug via SUBCUTANEOUS
  Filled 2013-12-10: qty 0.5

## 2013-12-24 ENCOUNTER — Encounter (HOSPITAL_COMMUNITY)
Admission: RE | Admit: 2013-12-24 | Discharge: 2013-12-24 | Disposition: A | Discharge status: Home / Self Care | Payer: Medicare Other | Source: Ambulatory Visit | Attending: Nephrology | Admitting: Nephrology

## 2013-12-24 DIAGNOSIS — N183 Chronic kidney disease, stage 3 unspecified: Secondary | ICD-10-CM | POA: Insufficient documentation

## 2013-12-24 DIAGNOSIS — D631 Anemia in chronic kidney disease: Secondary | ICD-10-CM | POA: Insufficient documentation

## 2013-12-24 DIAGNOSIS — N039 Chronic nephritic syndrome with unspecified morphologic changes: Principal | ICD-10-CM

## 2013-12-24 LAB — RENAL FUNCTION PANEL
Albumin: 3.4 g/dL — ABNORMAL LOW (ref 3.5–5.2)
BUN: 48 mg/dL — ABNORMAL HIGH (ref 6–23)
CALCIUM: 9.1 mg/dL (ref 8.4–10.5)
CHLORIDE: 100 meq/L (ref 96–112)
CO2: 37 meq/L — AB (ref 19–32)
CREATININE: 1.81 mg/dL — AB (ref 0.50–1.35)
GFR calc Af Amer: 45 mL/min — ABNORMAL LOW (ref >90)
GFR calc non Af Amer: 39 mL/min — ABNORMAL LOW (ref >90)
GLUCOSE: 123 mg/dL — AB (ref 70–99)
Phosphorus: 3.8 mg/dL (ref 2.3–4.6)
Potassium: 4.5 mEq/L (ref 3.7–5.3)
Sodium: 145 mEq/L (ref 137–147)

## 2013-12-24 LAB — POCT HEMOGLOBIN-HEMACUE: Hemoglobin: 9.8 g/dL — ABNORMAL LOW (ref 13.0–17.0)

## 2013-12-24 MED ORDER — DARBEPOETIN ALFA-POLYSORBATE 100 MCG/0.5ML IJ SOLN
INTRAMUSCULAR | Status: AC
  Filled 2013-12-24: qty 0.5

## 2013-12-24 MED ORDER — DARBEPOETIN ALFA-POLYSORBATE 500 MCG/ML IJ SOLN
100.0000 ug | INTRAMUSCULAR | Status: DC
  Administered 2013-12-24: 100 ug via SUBCUTANEOUS
  Filled 2013-12-24: qty 1

## 2013-12-26 MED FILL — Darbepoetin Alfa-Polysorbate 80 Soln Inj 100 MCG/0.5ML: INTRAMUSCULAR | Qty: 0.5 | Status: AC

## 2014-01-07 ENCOUNTER — Encounter (HOSPITAL_COMMUNITY): Payer: Medicare Other

## 2014-01-14 ENCOUNTER — Encounter (HOSPITAL_COMMUNITY)
Admission: RE | Admit: 2014-01-14 | Discharge: 2014-01-14 | Disposition: A | Discharge status: Home / Self Care | Payer: Medicare Other | Source: Ambulatory Visit | Attending: Nephrology | Admitting: Nephrology

## 2014-01-14 DIAGNOSIS — N183 Chronic kidney disease, stage 3 unspecified: Secondary | ICD-10-CM | POA: Insufficient documentation

## 2014-01-14 DIAGNOSIS — D631 Anemia in chronic kidney disease: Secondary | ICD-10-CM | POA: Insufficient documentation

## 2014-01-14 DIAGNOSIS — N039 Chronic nephritic syndrome with unspecified morphologic changes: Principal | ICD-10-CM

## 2014-01-14 LAB — POCT HEMOGLOBIN-HEMACUE: Hemoglobin: 9.7 g/dL — ABNORMAL LOW (ref 13.0–17.0)

## 2014-01-14 MED ORDER — DARBEPOETIN ALFA-POLYSORBATE 100 MCG/0.5ML IJ SOLN
INTRAMUSCULAR | Status: AC
  Administered 2014-01-14: 100 ug via SUBCUTANEOUS
  Filled 2014-01-14: qty 0.5

## 2014-01-14 MED ORDER — DARBEPOETIN ALFA-POLYSORBATE 500 MCG/ML IJ SOLN
100.0000 ug | INTRAMUSCULAR | Status: DC
  Filled 2014-01-14: qty 1

## 2014-01-28 ENCOUNTER — Other Ambulatory Visit: Payer: Self-pay | Admitting: *Deleted

## 2014-01-28 ENCOUNTER — Encounter (HOSPITAL_COMMUNITY)
Admission: RE | Admit: 2014-01-28 | Discharge: 2014-01-28 | Disposition: A | Discharge status: Home / Self Care | Payer: Medicare Other | Source: Ambulatory Visit | Attending: Nephrology | Admitting: Nephrology

## 2014-01-28 LAB — RENAL FUNCTION PANEL
ALBUMIN: 3.5 g/dL (ref 3.5–5.2)
BUN: 48 mg/dL — ABNORMAL HIGH (ref 6–23)
CHLORIDE: 93 meq/L — AB (ref 96–112)
CO2: 37 mEq/L — ABNORMAL HIGH (ref 19–32)
Calcium: 9.5 mg/dL (ref 8.4–10.5)
Creatinine, Ser: 1.76 mg/dL — ABNORMAL HIGH (ref 0.50–1.35)
GFR calc non Af Amer: 40 mL/min — ABNORMAL LOW (ref >90)
GFR, EST AFRICAN AMERICAN: 46 mL/min — AB (ref >90)
GLUCOSE: 136 mg/dL — AB (ref 70–99)
PHOSPHORUS: 2.9 mg/dL (ref 2.3–4.6)
POTASSIUM: 4.1 meq/L (ref 3.7–5.3)
SODIUM: 141 meq/L (ref 137–147)

## 2014-01-28 LAB — POCT HEMOGLOBIN-HEMACUE: HEMOGLOBIN: 9.9 g/dL — AB (ref 13.0–17.0)

## 2014-01-28 MED ORDER — DARBEPOETIN ALFA-POLYSORBATE 500 MCG/ML IJ SOLN
100.0000 ug | INTRAMUSCULAR | Status: DC
  Filled 2014-01-28: qty 1

## 2014-01-28 MED ORDER — NITROGLYCERIN 0.4 MG SL SUBL: 0.4000 mg | SUBLINGUAL_TABLET | SUBLINGUAL | Status: AC | PRN

## 2014-01-28 MED ORDER — DARBEPOETIN ALFA-POLYSORBATE 100 MCG/0.5ML IJ SOLN
INTRAMUSCULAR | Status: AC
  Administered 2014-01-28: 100 ug via SUBCUTANEOUS
  Filled 2014-01-28: qty 0.5

## 2014-01-28 NOTE — Telephone Encounter (Signed)
Electronic refill for NTG to [facility].

## 2014-02-11 ENCOUNTER — Encounter (HOSPITAL_COMMUNITY): Payer: Medicare Other

## 2014-02-24 ENCOUNTER — Encounter: Payer: Self-pay | Admitting: Internal Medicine

## 2014-02-24 ENCOUNTER — Ambulatory Visit (INDEPENDENT_AMBULATORY_CARE_PROVIDER_SITE_OTHER): Payer: Medicare Other | Admitting: Internal Medicine

## 2014-02-24 ENCOUNTER — Encounter (INDEPENDENT_AMBULATORY_CARE_PROVIDER_SITE_OTHER): Payer: Self-pay

## 2014-02-24 VITALS — BP 140/64 | HR 80 | Ht 73.0 in | Wt 307.0 lb

## 2014-02-24 DIAGNOSIS — I5032 Chronic diastolic (congestive) heart failure: Secondary | ICD-10-CM

## 2014-02-24 DIAGNOSIS — I509 Heart failure, unspecified: Secondary | ICD-10-CM

## 2014-02-24 DIAGNOSIS — J9610 Chronic respiratory failure, unspecified whether with hypoxia or hypercapnia: Secondary | ICD-10-CM

## 2014-02-24 DIAGNOSIS — Z930 Tracheostomy status: Secondary | ICD-10-CM

## 2014-02-24 MED ORDER — PREDNISONE 10 MG PO TABS: ORAL_TABLET | ORAL | Status: DC

## 2014-02-24 NOTE — Progress Notes (Signed)
Subjective:    Patient ID: [name] [name], male    DOB: [birth year], 63 y.o.   MRN: [id number]  HPI  HPI 01/23/11- COPD, CAD/diastolic dysfunction, OSA, chronic respiratory failure, obesity Hypoventilation, cor pulmonale Hosp since last here- once for "shakes"-they told him wasn't using CPAP right, once for epistaxis, once to place urinary stent, and has had a right ? renal biopsy. Told anemic.  Breathing stable, but can't lie flat- feels smothered even on O2.  Runs O2 2-3L/M usually. I gave permision to go to 4 during exertion. Notes "tiresome" feeling midchest, related to exertion/ climbing stairs and relieved by rest. Told to pace himself and not try to push through that.  Discussed need for O2. He doesn't sleep well with his BiPAP 15/12. Discussed sleep hygiene- discussed room temperature. He was more comfortable with autotitration in hosp and we discussed a trial of that at home.  Has restarted diuretic since leg edema has come back.   05/25/11-  COPD, CAD/diastolic dysfunction, OSA, chronic respiratory failure, obesity Hypoventilation, cor pulmonale Went to ER last week- short of breath. Air wasn't satisfying. Was started back on shot for anemia. Feels some better.  Reports sneezing, postnasal drainage and a sense of mucus in his upper chest. Denies fever, sore throat and doesn't think he has a cold.  06/28/11-  COPD, CAD/diastolic dysfunction, OSA, chronic respiratory failure, obesity Hypoventilation, cor pulmonale Recently hospitalized October 25-29, notes reviewed with him and x-ray images reviewed by me. He was hospitalized for hypercapnic respiratory failure with transient encephalopathy and renal insufficiency. Since discharge he has regained some ankle edema while off of furosemide. He has a nephrology appointment later this month. He is concerned that he was taken off his diabetes medicines and is directed to discuss this with his primary physician immediately. He has some  persistent soreness across his anterior chest wall at the level of the xiphoid but otherwise breathing better with less exertional dyspnea and before he went in the hospital. He remains dependent on continuous oxygen at 2 L. He continues his BiPAP machine all night, every night but says he was sleeping better with it  on autotitration with a lower pressure used in the hospital.  09/25/11- COPD, CAD/diastolic dysfunction, OSA, chronic respiratory failure, obesity Hypoventilation, cor pulmonale Since last here he was hospitalized for respiratory failure with CHF and obesity hypoventilation. Better with diuresis. BiPAP 13/10 is now comfortable and used all night every night with supplemental oxygen 4 L. Tolerated colonoscopy without respiratory distress. Uses a rescue inhaler only occasionally but says it does help then.  11/23/11- COPD, CAD/diastolic dysfunction, OSA, chronic respiratory failure, obesity Hypoventilation, cor pulmonale She is noticing some increased shortness of breath on exertion such as getting in the car but no increase in ankle edema which is well controlled. Occasional cough is not progressive or productive. Noticing more rhinorrhea with the pollen. Continues BiPAP  I 13/E 10 all night every night.  02/07/12- COPD, CAD/diastolic dysfunction, OSA, chronic respiratory failure, obesity Hypoventilation, cor pulmonale  pt states doing some better.wakes up out of a nap coughing feels like he's choking.  Post Hosp- 01/14/12- 01/23/12-discharge diagnoses reviewed with him: Coronary artery disease/CABG, diastolic dysfunction, COPD with chronic respiratory failure, acute respiratory failure with hypoxia, acute on chronic renal failure, DM. He was intubated for 4 days and treated with Zosyn, vancomycin and Avelox, Levaquin, Zithromax and Rocephin. Cultures negative. He was to wean off of prednisone over 3 days. He was hoarse after intubation but that has resolved.  He is using oxygen 1-2 L and BiPAP   13/10 at night. He feels he is pretty close to his baseline.  03/28/12-  COPD, CAD/diastolic dysfunction/ chronic CHF, OSA, chronic respiratory failure, obesity Hypoventilation, cor pulmonale  6 MWT today. Pt states breathing has been "okay" since last OV. Pt states having trouble falling alseep-not sleeping well on BiPAP. Pt states mask fit well but thinks the pressure needs to be adjusted again. Currently 13/10+ oxygen at 2 L/ Advanced. He sleeps better in a recliner with oxygen but without his BiPAP. Getting iron injections for anemia. We discussed shortness of breath and anemia. Pain left anterior axillary line described as a sore ache over the past week. He is able to raise his arm. Thinks he maybe lifted something wrong. COPD assessment test (CAT) 21/40 CXR 01/14/12- 1 view- IMPRESSION:  Stable support apparatus. Residual mild interstitial prominence  with slight improvement in aeration. No convincing pulmonary  edema. Probable small left pleural effusion with left basilar  atelectasis or infiltrate.  Original Report Authenticated By: [name], M.D.   2/35/36-RWER, CAD/diastolic dysfunction/ chronic CHF, OSA, chronic respiratory failure, obesity Hypoventilation, cor pulmonale Hospitalized again between March 9 and 12 for acute on chronic renal failure with congestive heart failure, chronic respiratory failure on BiPAP and acute respiratory failure with hypoxia. He says he is feeling better at this time. Continues oxygen 3 L/Advanced. He has been using BiPAP 13/10 but thinks he slept better with CPAP. We discussed options. Usually when he decompensates it is from fluid overload triggering CO2 retention and hypoventilation.  CXR 10/20/12 IMPRESSION:  Vascular congestion and cardiomegaly, with small bilateral pleural  effusions and bibasilar airspace opacification. Findings are most  compatible with recurrent pulmonary edema, though underlying  pneumonia cannot be excluded.  Original Report  Authenticated By: [name], M.D.  12/02/2012 [facility] follow up  Returns for a post hospital follow up .  Reports doing well overall but still having some SOB, thinks this may be due to his pressure not being high enough on BIPAP. Since decreasing BIPAP 1 month ago  down to 12/10 he does not feel as good. Trouble sleeping.  Was admitted for decompensated cor pulmonale w/ fluid overload with subsequent acute on chronic hypercarbic resp failure.  He admits he can not afford his advair and spiriva- therefore he is not taking.  He does not wear BIPAP everynight. We discussed the implications of this and untreated OHS/OSA. He naps a lot but does not wear BIPAP.  He was treated with diuresis with improvement.  Discharged on Lasix 40mg  . Lower dose due to bump in scr. Has OP follow up with Renal d/t renal insufficiency.   02/05/13- COPD, CAD/diastolic dysfunction/ chronic CHF, OSA, chronic respiratory failure, obesity Hypoventilation, cor pulmonale Hosp 6/16-6/19/14 Acute on Chronic Resp Failure Discharge Plan:  -pt educated on BiPAP compliance at home  -continue pulmicort, scheduled albuterol / atrovent (med cost has been an issue)  -wean prednisone to off  -given information for [employer] at [employer]. Pulmonary Rehab was a cost issue (45$ per visit)  -RN home health to review medications and reinforce need for BiPAP 12/12 wO2 2-3L for sleep, O2 3-4 awake.  FOLLOWS FOR: Breathing has improved since leaving the hospital. Reports DOE, chest tightness with exertion and slight coughing from time to time. Wife needs FMLA form. CXR 01/29/13 1View IMPRESSION:  Slightly improved aeration in the lungs may represent decreasing  edema.  Original Report Authenticated By: Markus Daft, M.  04/11/13-  COPD, CAD/diastolic dysfunction/ chronic CHF, OSA, chronic respiratory failure, obesity Hypoventilation, cor pulmonale FOLLOWS FOR: reports breathing is essentially unchanged but he feels like he's  "burning through more oxygen than I used to." denies wheezing, increased SOB, tightness, increased cough, f/c/s Just in hospital again acute on chronic respiratory failure with CHF and COPD. Today he feels well except for frontal headache. Continuing oxygen at 2.5 L. BIPAP Auto 5-15/ O2 2L/ Advanced CXR 1V 8/14 IMPRESSION:  1. Extubated, enteric tube removed. Stable lung volumes.  2. Stable ventilation. Small pleural effusions.  Electronically Signed  By: Lars Pinks  On: 04/02/2013 07:43  06/23/2013 [facility] follow up  Patient presents for a post hospital followup. Patient was admitted October 7 through October 30 for acute on chronic respiratory failure. Patient did require intubation for airway protection in the emergency room due to acute encephalopathy. Unfortunately, patient was unable to be weaned from the ventilator and underwent a tracheostomy. Patient was weaned to trach collar during the daytime and vent support. At bedtime. Lurline Idol was placed by ENT Dr. Redmond Baseman. He was transitioned to an extra long #6 trach prior to discharge.  Patient returns today accompanied by his wife. Patient reports that he has been doing okay. He strength. He is tolerating vent support at night. However, he does feel that this is uncomfortable in certain times. Vent settings at night are PRVC at a rate of 14 with a tidal volume of 450 cc with FiO2 of 40%, and a PEEP of 5. Patient has been using Passy-Muir valve for speech however, over the last couple days, feels it's been more difficulties than his usual. He does have follow with ENT later this week for trach evaluation. Patient denies any hemoptysis, trach site. Bleeding, Orthopnea, PND, or increased leg swelling. Has been eating okay without any noted , dysphagia. Appetite is decreased with no nausea, vomiting, diarrhea.  REC Continue on current regimen.  follow up with ENT this week for [facility] evaluation.  I will call with labs and xray results.    Continue on Trach Collar during daytime  Continue on Vent support At bedtime   Follow up Dr. Chase Caller in 6-8 weeks and As needed .  Please contact office for sooner follow up if symptoms do not improve or worsen or seek emergency care   Late Add : ? Right sided infiltrate  Add Levaquin 500mg  daily x 7 days #7 , no refills.    OV 07/21/2013 Followup chronic respiratory failure status post tracheostomy. Since seeing my nurse practitioner a month ago he is doing well. Uses ventilator at night and daytime is on tracheostomy collar. He is not compliant with his Passy-Muir valve because it gives him a sensation of suffocation. But he is able to talk with open tracheostomy. He still reports chronic stable dyspnea on exertion for walking a block or sometimes half a block only. This is relieved by rest. Rated as moderate. There is no associated chest pain no worsening cough or phlegm. He did have some hemoptysis yesterday and he is trying to get in contact with the ENT specialist. As noted just beside bleeding. He denies any orthopnea or paroxysmal nocturnal dyspnea. Eating okay.  Lab review I do note that is not had a CT scan of the chest since 2012 rec Glad you are doing well CMA will refer you to pumonary rehab on basis of chronic resp failure Please do CT scan chest for pleural fibrosis, pleural plaques Followup with ENT> Dr Redmond Baseman, changed trach on  12/15  07/31/2013 f/u ov/Wert re: worse since 07/24/13 (day of CT Chest - see below) Chief Complaint  Patient presents with  . Acute Visit    Pt c/o increased SOB for the past several days. He states gets out of breath with any sort of exertion.  He has minimal cough that is non prod.   mucus is clear/ not bloody Baseline use of alb not much at all,  One every few days at most Assoc with increased fluid in legs  But followed by Gwenlyn Found who adjusts his lasix  No obvious patterns in day to day or daytime variabilty or assoc chronic cough or cp or  chest tightness, subjective wheeze overt sinus or hb symptoms. No unusual exp hx or h/o childhood pna/ asthma or knowledge of premature birth.  Sleeping ok without nocturnal  or early am exacerbation  of respiratory  c/o's or need for noct saba. Also denies any obvious fluctuation of symptoms with weather or environmental changes or other aggravating or alleviating factors except as outlined above    09/15/2013 Follow up  Returns for a 2 month follow up for COPD, chronic resp failure -trach dependent.  Reports breathing has worsened x2 weeks w/ increased SOB, chest tightness, cough with white mucus. Coughing and congestion make it difficult to use vent at night . Denies any f/c/s, nausea, vomiting, hemoptysis, edema.  Was referred to pulmonary rehab however could not afford co-pay amounts. We discussed looking into pt assistance.    OV 10/28/2013  Chief Complaint  Patient presents with  . Follow-up    Pt is c/o rattling in his chest, and dry cough. Pt also c/o increased swelling in both ankles.     Followup chronic respiratory failure status post tracheostomy by ENT. Chronic respiratory failure on account of COPD, OSA, diastolic heart failure  - Overall he is stable. He reports using the ventilator at night but occasionally he is noncompliant. When he does not use the ventilator the next day he is more fatigued according to his wife. In the daytime he is on trach collar on room air of liters of oxygen. He occasionally uses his Passy-Muir valve for talk but otherwise feels a sense of suffocation. The last few weeks he's noticed some increased pedal edema and despite increasing and self titrating his Lasix. In association with this he is more rattling in his chest and more shortness of breath and cough but there is no change in sputum color. The sputum is not pink and frothy. He has not seen his cardiologist Dr.? Dr Adora Fridge a while and he plans to see him more expeditiously. Denies any chest pain  cough fever chills or wheezing.  Copd appears stable However, diastolic chf might be worse My office will help you seen by Dr Gwenlyn Found office next day or two  REturn to see me in 4 months or sooner if needed  - continue trach care through ENT clinic   OV 02/24/2014  Chief Complaint  Patient presents with  . Follow-up    Pt c/o increase SOB, cough with clear mucous, intermittent edema in BIL ankles. C/o upper mid sternal CP and left upper CP when on ventilator.    Followup chronic respiratory failure on account of obesity, sleep apnea, COPD and diastolic heart failure  - He continues to feel poorly. For the last month or so he produces increased shortness of breath associated with worsening pedal edema. Is only on Lasix 40 mg once daily. He says he is  compliant with his nebulizers. Although, he is noncompliant with his ventilator stating that the pressures are too high. He probably uses it less than 50% of the time. In addition he is very keen on getting his tracheostomy removed permanently this is despite our advice that he keep it in permanently. He is frustrated by his quality of life. I've explained to him that weight loss is paramount but he is not too keen on this. He reports that his cough, sputum or in sputum color or roughly the same as baseline without any fever    Review of Systems  Constitutional: Negative for fever and unexpected weight change.  HENT: Positive for congestion. Negative for dental problem, ear pain, nosebleeds, postnasal drip, rhinorrhea, sinus pressure, sneezing, sore throat and trouble swallowing.   Eyes: Negative for redness and itching.  Respiratory: Positive for cough and shortness of breath. Negative for chest tightness and wheezing.   Cardiovascular: Positive for chest pain. Negative for palpitations and leg swelling.  Gastrointestinal: Negative for nausea and vomiting.  Genitourinary: Negative for dysuria.  Musculoskeletal: Negative for joint swelling.   Skin: Negative for rash.  Neurological: Negative for headaches.  Hematological: Does not bruise/bleed easily.  Psychiatric/Behavioral: Negative for dysphoric mood. The patient is not nervous/anxious.        Objective:   Physical Exam  Filed Vitals:   02/24/14 1151  BP: 140/64  Pulse: 80  Height: 6\' 1"  (1.854 m)  Weight: 307 lb (139.254 kg)  SpO2: 96%    ursing note and vitals reviewed. Constitutional: He is oriented to person, place, and time. He appears well-developed and well-nourished. No distress.  Body mass index is 38.27 kg/(m^2).   HENT:  Head: Normocephalic and atraumatic.  Right Ear: External ear normal.  Left Ear: External ear normal.  Mouth/Throat: Oropharynx is clear and moist. No oropharyngeal exudate.  Trach +. Speaking through [place]  Eyes: Conjunctivae and EOM are normal. Pupils are equal, round, and reactive to light. Right eye exhibits no discharge. Left eye exhibits no discharge. No scleral icterus.  Neck: Normal range of motion. Neck supple. No JVD present. No tracheal deviation present. No thyromegaly present.  Cardiovascular: Normal rate, regular rhythm and intact distal pulses.  Exam reveals no gallop and no friction rub.   No murmur heard. Pulmonary/Chest: Effort normal and breath sounds normal. No respiratory distress. He has no wheezes. He has no rales. He exhibits no tenderness.  Abdominal: Soft. Bowel sounds are normal. He exhibits no distension and no mass. There is no tenderness. There is no rebound and no guarding.  Musculoskeletal: Normal range of motion. He exhibits edema. He exhibits no tenderness.  ++ edema  Lymphadenopathy:    He has no cervical adenopathy.  Neurological: He is alert and oriented to person, place, and time. He has normal reflexes. No cranial nerve deficit. Coordination normal.  Skin: Skin is warm and dry. No rash noted. He is not diaphoretic. No erythema. No pallor.  Psychiatric: He has a normal mood and affect. His behavior  is normal. Judgment and thought content normal.        Assessment & Plan:  Still think you are not doing better because of diastolic heart congestion that is likely related to not using ventilator regularly  Understand you want tracheostomy removed permanently; I recommend you keep it permanently till weight loss happens  Plan  - will send message to home respiratory care to reduce pressure on ventilator  - increase lasix to 40mg  twice daily  - in  case there is bronchitis -Please take prednisone 40 mg x1 day, then 30 mg x1 day, then 20 mg x1 day, then 10 mg x1 day, and then 5 mg x1 day and stop   Followup  -2-3 weeks with NP Tammy; might need blood work at followup - she can decide

## 2014-02-24 NOTE — Patient Instructions (Addendum)
Still think you are not doing better because of diastolic heart congestion that is likely related to not using ventilator regularly  Understand you want tracheostomy removed permanently; I recommend you keep it permanently till weight loss happens  Plan  - will send message to home respiratory care to reduce pressure on ventilator  - increase lasix to 40mg  twice daily  - in case there is bronchitis -Please take prednisone 40 mg x1 day, then 30 mg x1 day, then 20 mg x1 day, then 10 mg x1 day, and then 5 mg x1 day and stop   Followup  -2-3 weeks with NP Tammy; might need blood work at followup - she can decide

## 2014-03-06 ENCOUNTER — Encounter (HOSPITAL_COMMUNITY)
Admission: RE | Admit: 2014-03-06 | Discharge: 2014-03-06 | Disposition: A | Discharge status: Home / Self Care | Payer: Medicare Other | Source: Ambulatory Visit | Attending: Nephrology | Admitting: Nephrology

## 2014-03-06 DIAGNOSIS — N183 Chronic kidney disease, stage 3 unspecified: Secondary | ICD-10-CM | POA: Insufficient documentation

## 2014-03-06 DIAGNOSIS — D631 Anemia in chronic kidney disease: Secondary | ICD-10-CM | POA: Insufficient documentation

## 2014-03-06 DIAGNOSIS — N039 Chronic nephritic syndrome with unspecified morphologic changes: Principal | ICD-10-CM

## 2014-03-06 LAB — IRON AND TIBC
Iron: 42 ug/dL (ref 42–135)
SATURATION RATIOS: 17 % — AB (ref 20–55)
TIBC: 241 ug/dL (ref 215–435)
UIBC: 199 ug/dL (ref 125–400)

## 2014-03-06 LAB — RENAL FUNCTION PANEL
Albumin: 3.5 g/dL (ref 3.5–5.2)
Anion gap: 10 (ref 5–15)
BUN: 53 mg/dL — AB (ref 6–23)
CHLORIDE: 94 meq/L — AB (ref 96–112)
CO2: 39 mEq/L — ABNORMAL HIGH (ref 19–32)
Calcium: 8.9 mg/dL (ref 8.4–10.5)
Creatinine, Ser: 1.75 mg/dL — ABNORMAL HIGH (ref 0.50–1.35)
GFR calc Af Amer: 47 mL/min — ABNORMAL LOW (ref >90)
GFR calc non Af Amer: 40 mL/min — ABNORMAL LOW (ref >90)
GLUCOSE: 130 mg/dL — AB (ref 70–99)
Phosphorus: 2.5 mg/dL (ref 2.3–4.6)
Potassium: 4.3 mEq/L (ref 3.7–5.3)
Sodium: 143 mEq/L (ref 137–147)

## 2014-03-06 LAB — FERRITIN: Ferritin: 590 ng/mL — ABNORMAL HIGH (ref 22–322)

## 2014-03-06 LAB — POCT HEMOGLOBIN-HEMACUE: HEMOGLOBIN: 9.3 g/dL — AB (ref 13.0–17.0)

## 2014-03-06 MED ORDER — DARBEPOETIN ALFA-POLYSORBATE 500 MCG/ML IJ SOLN
100.0000 ug | INTRAMUSCULAR | Status: DC
  Filled 2014-03-06: qty 1

## 2014-03-06 MED ORDER — DARBEPOETIN ALFA-POLYSORBATE 100 MCG/0.5ML IJ SOLN
INTRAMUSCULAR | Status: AC
  Administered 2014-03-06: 100 ug via SUBCUTANEOUS
  Filled 2014-03-06: qty 0.5

## 2014-03-10 ENCOUNTER — Ambulatory Visit (INDEPENDENT_AMBULATORY_CARE_PROVIDER_SITE_OTHER): Payer: Medicare Other | Admitting: Adult Health

## 2014-03-10 DIAGNOSIS — I509 Heart failure, unspecified: Secondary | ICD-10-CM

## 2014-03-10 DIAGNOSIS — J449 Chronic obstructive pulmonary disease, unspecified: Secondary | ICD-10-CM

## 2014-03-10 DIAGNOSIS — I5032 Chronic diastolic (congestive) heart failure: Secondary | ICD-10-CM

## 2014-03-10 DIAGNOSIS — J9610 Chronic respiratory failure, unspecified whether with hypoxia or hypercapnia: Secondary | ICD-10-CM

## 2014-03-10 DIAGNOSIS — J4489 Other specified chronic obstructive pulmonary disease: Secondary | ICD-10-CM

## 2014-03-10 NOTE — Patient Instructions (Signed)
Continue Pulmicort and Brovana Neb Twice daily  , rinse after use.  Continue on Trach Collar during daytime  Continue on Vent support At bedtime   Follow up Dr. Chase Caller in 2 months and As needed .  Please contact office for sooner follow up if symptoms do not improve or worsen or seek emergency care

## 2014-03-11 ENCOUNTER — Telehealth: Payer: Self-pay | Admitting: Internal Medicine

## 2014-03-11 NOTE — Telephone Encounter (Signed)
[id number] x1 

## 2014-03-12 MED ORDER — ALBUTEROL SULFATE (2.5 MG/3ML) 0.083% IN NEBU: 2.5000 mg | INHALATION_SOLUTION | RESPIRATORY_TRACT | Status: DC | PRN

## 2014-03-12 NOTE — Telephone Encounter (Signed)
RX for albuterol neb has been sent to the pharm Pt is aware nothing further needed

## 2014-03-13 NOTE — Progress Notes (Signed)
Subjective:    Patient ID: [name] [name], male    DOB: [birth year], 63 y.o.   MRN: [id number]  HPI  HPI 01/23/11- COPD, CAD/diastolic dysfunction, OSA, chronic respiratory failure, obesity Hypoventilation, cor pulmonale Hosp since last here- once for "shakes"-they told him wasn't using CPAP right, once for epistaxis, once to place urinary stent, and has had a right ? renal biopsy. Told anemic.  Breathing stable, but can't lie flat- feels smothered even on O2.  Runs O2 2-3L/M usually. I gave permision to go to 4 during exertion. Notes "tiresome" feeling midchest, related to exertion/ climbing stairs and relieved by rest. Told to pace himself and not try to push through that.  Discussed need for O2. He doesn't sleep well with his BiPAP 15/12. Discussed sleep hygiene- discussed room temperature. He was more comfortable with autotitration in hosp and we discussed a trial of that at home.  Has restarted diuretic since leg edema has come back.   05/25/11-  COPD, CAD/diastolic dysfunction, OSA, chronic respiratory failure, obesity Hypoventilation, cor pulmonale Went to ER last week- short of breath. Air wasn't satisfying. Was started back on shot for anemia. Feels some better.  Reports sneezing, postnasal drainage and a sense of mucus in his upper chest. Denies fever, sore throat and doesn't think he has a cold.  06/28/11-  COPD, CAD/diastolic dysfunction, OSA, chronic respiratory failure, obesity Hypoventilation, cor pulmonale Recently hospitalized October 25-29, notes reviewed with him and x-ray images reviewed by me. He was hospitalized for hypercapnic respiratory failure with transient encephalopathy and renal insufficiency. Since discharge he has regained some ankle edema while off of furosemide. He has a nephrology appointment later this month. He is concerned that he was taken off his diabetes medicines and is directed to discuss this with his primary physician immediately. He has some  persistent soreness across his anterior chest wall at the level of the xiphoid but otherwise breathing better with less exertional dyspnea and before he went in the hospital. He remains dependent on continuous oxygen at 2 L. He continues his BiPAP machine all night, every night but says he was sleeping better with it  on autotitration with a lower pressure used in the hospital.  09/25/11- COPD, CAD/diastolic dysfunction, OSA, chronic respiratory failure, obesity Hypoventilation, cor pulmonale Since last here he was hospitalized for respiratory failure with CHF and obesity hypoventilation. Better with diuresis. BiPAP 13/10 is now comfortable and used all night every night with supplemental oxygen 4 L. Tolerated colonoscopy without respiratory distress. Uses a rescue inhaler only occasionally but says it does help then.  11/23/11- COPD, CAD/diastolic dysfunction, OSA, chronic respiratory failure, obesity Hypoventilation, cor pulmonale She is noticing some increased shortness of breath on exertion such as getting in the car but no increase in ankle edema which is well controlled. Occasional cough is not progressive or productive. Noticing more rhinorrhea with the pollen. Continues BiPAP  I 13/E 10 all night every night.  02/07/12- COPD, CAD/diastolic dysfunction, OSA, chronic respiratory failure, obesity Hypoventilation, cor pulmonale  pt states doing some better.wakes up out of a nap coughing feels like he's choking.  Post Hosp- 01/14/12- 01/23/12-discharge diagnoses reviewed with him: Coronary artery disease/CABG, diastolic dysfunction, COPD with chronic respiratory failure, acute respiratory failure with hypoxia, acute on chronic renal failure, DM. He was intubated for 4 days and treated with Zosyn, vancomycin and Avelox, Levaquin, Zithromax and Rocephin. Cultures negative. He was to wean off of prednisone over 3 days. He was hoarse after intubation but that has resolved.  He is using oxygen 1-2 L and BiPAP   13/10 at night. He feels he is pretty close to his baseline.  03/28/12-  COPD, CAD/diastolic dysfunction/ chronic CHF, OSA, chronic respiratory failure, obesity Hypoventilation, cor pulmonale  6 MWT today. Pt states breathing has been "okay" since last OV. Pt states having trouble falling alseep-not sleeping well on BiPAP. Pt states mask fit well but thinks the pressure needs to be adjusted again. Currently 13/10+ oxygen at 2 L/ Advanced. He sleeps better in a recliner with oxygen but without his BiPAP. Getting iron injections for anemia. We discussed shortness of breath and anemia. Pain left anterior axillary line described as a sore ache over the past week. He is able to raise his arm. Thinks he maybe lifted something wrong. COPD assessment test (CAT) 21/40 CXR 01/14/12- 1 view- IMPRESSION:  Stable support apparatus. Residual mild interstitial prominence  with slight improvement in aeration. No convincing pulmonary  edema. Probable small left pleural effusion with left basilar  atelectasis or infiltrate.  Original Report Authenticated By: [name], M.D.   02/03/28-NLGX, CAD/diastolic dysfunction/ chronic CHF, OSA, chronic respiratory failure, obesity Hypoventilation, cor pulmonale Hospitalized again between March 9 and 12 for acute on chronic renal failure with congestive heart failure, chronic respiratory failure on BiPAP and acute respiratory failure with hypoxia. He says he is feeling better at this time. Continues oxygen 3 L/Advanced. He has been using BiPAP 13/10 but thinks he slept better with CPAP. We discussed options. Usually when he decompensates it is from fluid overload triggering CO2 retention and hypoventilation.  CXR 10/20/12 IMPRESSION:  Vascular congestion and cardiomegaly, with small bilateral pleural  effusions and bibasilar airspace opacification. Findings are most  compatible with recurrent pulmonary edema, though underlying  pneumonia cannot be excluded.  Original Report  Authenticated By: [name], M.D.  12/02/2012 [facility] follow up  Returns for a post hospital follow up .  Reports doing well overall but still having some SOB, thinks this may be due to his pressure not being high enough on BIPAP. Since decreasing BIPAP 1 month ago  down to 12/10 he does not feel as good. Trouble sleeping.  Was admitted for decompensated cor pulmonale w/ fluid overload with subsequent acute on chronic hypercarbic resp failure.  He admits he can not afford his advair and spiriva- therefore he is not taking.  He does not wear BIPAP everynight. We discussed the implications of this and untreated OHS/OSA. He naps a lot but does not wear BIPAP.  He was treated with diuresis with improvement.  Discharged on Lasix 40mg  . Lower dose due to bump in scr. Has OP follow up with Renal d/t renal insufficiency.   02/05/13- COPD, CAD/diastolic dysfunction/ chronic CHF, OSA, chronic respiratory failure, obesity Hypoventilation, cor pulmonale Hosp 6/16-6/19/14 Acute on Chronic Resp Failure Discharge Plan:  -pt educated on BiPAP compliance at home  -continue pulmicort, scheduled albuterol / atrovent (med cost has been an issue)  -wean prednisone to off  -given information for [employer] at [employer]. Pulmonary Rehab was a cost issue (45$ per visit)  -RN home health to review medications and reinforce need for BiPAP 12/12 wO2 2-3L for sleep, O2 3-4 awake.  FOLLOWS FOR: Breathing has improved since leaving the hospital. Reports DOE, chest tightness with exertion and slight coughing from time to time. Wife needs FMLA form. CXR 01/29/13 1View IMPRESSION:  Slightly improved aeration in the lungs may represent decreasing  edema.  Original Report Authenticated By: Markus Daft, M.  04/11/13-  COPD, CAD/diastolic dysfunction/ chronic CHF, OSA, chronic respiratory failure, obesity Hypoventilation, cor pulmonale FOLLOWS FOR: reports breathing is essentially unchanged but he feels like he's  "burning through more oxygen than I used to." denies wheezing, increased SOB, tightness, increased cough, f/c/s Just in hospital again acute on chronic respiratory failure with CHF and COPD. Today he feels well except for frontal headache. Continuing oxygen at 2.5 L. BIPAP Auto 5-15/ O2 2L/ Advanced CXR 1V 8/14 IMPRESSION:  1. Extubated, enteric tube removed. Stable lung volumes.  2. Stable ventilation. Small pleural effusions.  Electronically Signed  By: Lars Pinks  On: 04/02/2013 07:43  06/23/2013 [facility] follow up  Patient presents for a post hospital followup. Patient was admitted October 7 through October 30 for acute on chronic respiratory failure. Patient did require intubation for airway protection in the emergency room due to acute encephalopathy. Unfortunately, patient was unable to be weaned from the ventilator and underwent a tracheostomy. Patient was weaned to trach collar during the daytime and vent support. At bedtime. Lurline Idol was placed by ENT Dr. Redmond Baseman. He was transitioned to an extra long #6 trach prior to discharge.  Patient returns today accompanied by his wife. Patient reports that he has been doing okay. He strength. He is tolerating vent support at night. However, he does feel that this is uncomfortable in certain times. Vent settings at night are PRVC at a rate of 14 with a tidal volume of 450 cc with FiO2 of 40%, and a PEEP of 5. Patient has been using Passy-Muir valve for speech however, over the last couple days, feels it's been more difficulties than his usual. He does have follow with ENT later this week for trach evaluation. Patient denies any hemoptysis, trach site. Bleeding, Orthopnea, PND, or increased leg swelling. Has been eating okay without any noted , dysphagia. Appetite is decreased with no nausea, vomiting, diarrhea.  REC Continue on current regimen.  follow up with ENT this week for [facility] evaluation.  I will call with labs and xray results.    Continue on Trach Collar during daytime  Continue on Vent support At bedtime   Follow up Dr. Chase Caller in 6-8 weeks and As needed .  Please contact office for sooner follow up if symptoms do not improve or worsen or seek emergency care   Late Add : ? Right sided infiltrate  Add Levaquin 500mg  daily x 7 days #7 , no refills.    OV 07/21/2013 Followup chronic respiratory failure status post tracheostomy. Since seeing my nurse practitioner a month ago he is doing well. Uses ventilator at night and daytime is on tracheostomy collar. He is not compliant with his Passy-Muir valve because it gives him a sensation of suffocation. But he is able to talk with open tracheostomy. He still reports chronic stable dyspnea on exertion for walking a block or sometimes half a block only. This is relieved by rest. Rated as moderate. There is no associated chest pain no worsening cough or phlegm. He did have some hemoptysis yesterday and he is trying to get in contact with the ENT specialist. As noted just beside bleeding. He denies any orthopnea or paroxysmal nocturnal dyspnea. Eating okay.  Lab review I do note that is not had a CT scan of the chest since 2012 rec Glad you are doing well CMA will refer you to pumonary rehab on basis of chronic resp failure Please do CT scan chest for pleural fibrosis, pleural plaques Followup with ENT> Dr Redmond Baseman, changed trach on  12/15  07/31/2013 f/u ov/Wert re: worse since 07/24/13 (day of CT Chest - see below) Chief Complaint  Patient presents with  . Acute Visit    Pt c/o increased SOB for the past several days. He states gets out of breath with any sort of exertion.  He has minimal cough that is non prod.   mucus is clear/ not bloody Baseline use of alb not much at all,  One every few days at most Assoc with increased fluid in legs  But followed by Gwenlyn Found who adjusts his lasix  No obvious patterns in day to day or daytime variabilty or assoc chronic cough or cp or  chest tightness, subjective wheeze overt sinus or hb symptoms. No unusual exp hx or h/o childhood pna/ asthma or knowledge of premature birth.  Sleeping ok without nocturnal  or early am exacerbation  of respiratory  c/o's or need for noct saba. Also denies any obvious fluctuation of symptoms with weather or environmental changes or other aggravating or alleviating factors except as outlined above    09/15/2013 Follow up  Returns for a 2 month follow up for COPD, chronic resp failure -trach dependent.  Reports breathing has worsened x2 weeks w/ increased SOB, chest tightness, cough with white mucus. Coughing and congestion make it difficult to use vent at night . Denies any f/c/s, nausea, vomiting, hemoptysis, edema.  Was referred to pulmonary rehab however could not afford co-pay amounts. We discussed looking into pt assistance.    OV 10/28/2013  Chief Complaint  Patient presents with  . Follow-up    Pt is c/o rattling in his chest, and dry cough. Pt also c/o increased swelling in both ankles.     Followup chronic respiratory failure status post tracheostomy by ENT. Chronic respiratory failure on account of COPD, OSA, diastolic heart failure  - Overall he is stable. He reports using the ventilator at night but occasionally he is noncompliant. When he does not use the ventilator the next day he is more fatigued according to his wife. In the daytime he is on trach collar on room air of liters of oxygen. He occasionally uses his Passy-Muir valve for talk but otherwise feels a sense of suffocation. The last few weeks he's noticed some increased pedal edema and despite increasing and self titrating his Lasix. In association with this he is more rattling in his chest and more shortness of breath and cough but there is no change in sputum color. The sputum is not pink and frothy. He has not seen his cardiologist Dr.? Dr Adora Fridge a while and he plans to see him more expeditiously. Denies any chest pain  cough fever chills or wheezing.  Copd appears stable However, diastolic chf might be worse My office will help you seen by Dr Gwenlyn Found office next day or two  REturn to see me in 4 months or sooner if needed  - continue trach care through ENT clinic   OV 02/24/2014  Chief Complaint  Patient presents with  . Follow-up    Pt c/o increase SOB, cough with clear mucous, intermittent edema in BIL ankles. C/o upper mid sternal CP and left upper CP when on ventilator.    Followup chronic respiratory failure on account of obesity, sleep apnea, COPD and diastolic heart failure  - He continues to feel poorly. For the last month or so he produces increased shortness of breath associated with worsening pedal edema. Is only on Lasix 40 mg once daily. He says he is  compliant with his nebulizers. Although, he is noncompliant with his ventilator stating that the pressures are too high. He probably uses it less than 50% of the time. In addition he is very keen on getting his tracheostomy removed permanently this is despite our advice that he keep it in permanently. He is frustrated by his quality of life. I've explained to him that weight loss is paramount but he is not too keen on this. He reports that his cough, sputum or in sputum color or roughly the same as baseline without any fever >>pred taper , increased lasix 40mg  Twice daily    03/10/14 Follow up COPD/OHS/ Trach Depend RF /Noct Vent Returns for follow up for COPD . Seen last ov with COPD flare and suspected decompensated Diastolic CHF. He was treated with prednisone taper and diuretics were increased.  He says he is some better but gets winded easily.  Encouraged to use vent at night .  Leg swelling slightly better. Labs last week showed scr stable.  No chest pain , hemoptyiss, fever or n/vd/    Review of Systems  Constitutional: Negative for fever and unexpected weight change.  HENT: Negative for dental problem, ear pain, nosebleeds, postnasal  drip, rhinorrhea, sinus pressure, sneezing, sore throat and trouble swallowing.   Eyes: Negative for redness and itching.  Respiratory: Positive for cough and shortness of breath. Negative for chest tightness and wheezing.   Cardiovascular:  Negative for palpitations and leg swelling.  Gastrointestinal: Negative for nausea and vomiting.  Genitourinary: Negative for dysuria.  Musculoskeletal: Negative for joint swelling.  Skin: Negative for rash.  Neurological: Negative for headaches.  Hematological: Does not bruise/bleed easily.  Psychiatric/Behavioral: Negative for dysphoric mood. The patient is not nervous/anxious.        Objective:   Physical Exam     HENT:  Head: Normocephalic and atraumatic.  Right Ear: External ear normal.  Left Ear: External ear normal.  Mouth/Throat: Oropharynx is clear and moist. No oropharyngeal exudate.  Trach +. Speaking through [place]  Eyes: Conjunctivae and EOM are normal. Pupils are equal, round, and reactive to light. Right eye exhibits no discharge. Left eye exhibits no discharge. No scleral icterus.  Neck: Normal range of motion. Neck supple. No JVD present. No tracheal deviation present. No thyromegaly present.  Cardiovascular: Normal rate, regular rhythm and intact distal pulses.  Exam reveals no gallop and no friction rub.   No murmur heard. Pulmonary/Chest: Effort normal and breath sounds normal. No respiratory distress. He has no wheezes. He has no rales. He exhibits no tenderness.  Abdominal: Soft. Bowel sounds are normal. He exhibits no distension and no mass. There is no tenderness. There is no rebound and no guarding.  Musculoskeletal: Normal range of motion. He exhibits edema. He exhibits no tenderness.  + edema  Lymphadenopathy:    He has no cervical adenopathy.  Neurological: He is alert and oriented to person, place, and time. He has normal reflexes. No cranial nerve deficit. Coordination normal.  Skin: Skin is warm and dry. No rash  noted. He is not diaphoretic. No erythema. No pallor.  Psychiatric: He has a normal mood and affect. His behavior is normal. Judgment and thought content normal.        Assessment & Plan:

## 2014-03-13 NOTE — Assessment & Plan Note (Signed)
Continue Pulmicort and Brovana Neb Twice daily  , rinse after use.    Follow up Dr. Chase Caller in 2 months and As needed .  Please contact office for sooner follow up if symptoms do not improve or worsen or seek emergency care

## 2014-03-13 NOTE — Assessment & Plan Note (Signed)
Continue Pulmicort and Brovana Neb Twice daily  , rinse after use.  Continue on Trach Collar during daytime  Continue on Vent support At bedtime   Follow up Dr. Chase Caller in 2 months and As needed .  Please contact office for sooner follow up if symptoms do not improve or worsen or seek emergency care

## 2014-03-13 NOTE — Assessment & Plan Note (Signed)
Cont on diuretics  Low salt diet  Keep legs elevated

## 2014-03-16 NOTE — Assessment & Plan Note (Signed)
Still think you are not doing better because of diastolic heart congestion that is likely related to not using ventilator regularly  Understand you want tracheostomy removed permanently; I recommend you keep it permanently till weight loss happens  Plan  - will send message to home respiratory care to reduce pressure on ventilator  - increase lasix to 40mg  twice daily  - in case there is bronchitis -Please take prednisone 40 mg x1 day, then 30 mg x1 day, then 20 mg x1 day, then 10 mg x1 day, and then 5 mg x1 day and stop   Followup  -2-3 weeks with NP Tammy; might need blood work at followup - she can decide  > 50% of this > 25 min visit spent in face to face counseling (15 min visit converted to 25 min)

## 2014-03-19 ENCOUNTER — Other Ambulatory Visit (HOSPITAL_COMMUNITY): Payer: Self-pay | Admitting: *Deleted

## 2014-03-20 ENCOUNTER — Encounter (HOSPITAL_COMMUNITY)
Admission: RE | Admit: 2014-03-20 | Discharge: 2014-03-20 | Disposition: A | Discharge status: Home / Self Care | Payer: Medicare Other | Source: Ambulatory Visit | Attending: Nephrology | Admitting: Nephrology

## 2014-03-20 DIAGNOSIS — N039 Chronic nephritic syndrome with unspecified morphologic changes: Secondary | ICD-10-CM | POA: Diagnosis not present

## 2014-03-20 DIAGNOSIS — N183 Chronic kidney disease, stage 3 unspecified: Secondary | ICD-10-CM | POA: Diagnosis not present

## 2014-03-20 DIAGNOSIS — D631 Anemia in chronic kidney disease: Secondary | ICD-10-CM | POA: Insufficient documentation

## 2014-03-20 LAB — POCT HEMOGLOBIN-HEMACUE: HEMOGLOBIN: 9 g/dL — AB (ref 13.0–17.0)

## 2014-03-20 MED ORDER — SODIUM CHLORIDE 0.9 % IV SOLN
1020.0000 mg | Freq: Once | INTRAVENOUS | Status: AC
  Administered 2014-03-20: 1020 mg via INTRAVENOUS
  Filled 2014-03-20: qty 34

## 2014-03-20 MED ORDER — DARBEPOETIN ALFA-POLYSORBATE 500 MCG/ML IJ SOLN
100.0000 ug | INTRAMUSCULAR | Status: DC
  Filled 2014-03-20: qty 1

## 2014-03-20 MED ORDER — DARBEPOETIN ALFA-POLYSORBATE 100 MCG/0.5ML IJ SOLN
INTRAMUSCULAR | Status: AC
  Administered 2014-03-20: 100 ug via SUBCUTANEOUS
  Filled 2014-03-20: qty 0.5

## 2014-04-02 ENCOUNTER — Telehealth: Payer: Self-pay | Admitting: Cardiovascular Disease

## 2014-04-02 MED ORDER — FUROSEMIDE 40 MG PO TABS: 40.0000 mg | ORAL_TABLET | Freq: Two times a day (BID) | ORAL | Status: DC

## 2014-04-02 NOTE — Telephone Encounter (Signed)
Rx refill sent to patient pharmacy   

## 2014-04-02 NOTE — Telephone Encounter (Signed)
Need a new prescription for his Furosemide 40 mg #90. Please call to [facility] [phone].

## 2014-04-03 ENCOUNTER — Encounter (HOSPITAL_COMMUNITY)
Admission: RE | Admit: 2014-04-03 | Discharge: 2014-04-03 | Disposition: A | Discharge status: Home / Self Care | Payer: Medicare Other | Source: Ambulatory Visit | Attending: Nephrology | Admitting: Nephrology

## 2014-04-03 DIAGNOSIS — D631 Anemia in chronic kidney disease: Secondary | ICD-10-CM | POA: Diagnosis not present

## 2014-04-03 LAB — RENAL FUNCTION PANEL
ANION GAP: 10 (ref 5–15)
Albumin: 3.3 g/dL — ABNORMAL LOW (ref 3.5–5.2)
BUN: 35 mg/dL — ABNORMAL HIGH (ref 6–23)
CO2: 39 meq/L — AB (ref 19–32)
Calcium: 8.8 mg/dL (ref 8.4–10.5)
Chloride: 93 mEq/L — ABNORMAL LOW (ref 96–112)
Creatinine, Ser: 1.72 mg/dL — ABNORMAL HIGH (ref 0.50–1.35)
GFR calc non Af Amer: 41 mL/min — ABNORMAL LOW (ref >90)
GFR, EST AFRICAN AMERICAN: 47 mL/min — AB (ref >90)
GLUCOSE: 149 mg/dL — AB (ref 70–99)
POTASSIUM: 4.2 meq/L (ref 3.7–5.3)
Phosphorus: 2.4 mg/dL (ref 2.3–4.6)
SODIUM: 142 meq/L (ref 137–147)

## 2014-04-03 LAB — POCT HEMOGLOBIN-HEMACUE: Hemoglobin: 9.6 g/dL — ABNORMAL LOW (ref 13.0–17.0)

## 2014-04-03 MED ORDER — DARBEPOETIN ALFA-POLYSORBATE 100 MCG/0.5ML IJ SOLN
INTRAMUSCULAR | Status: AC
  Administered 2014-04-03: 100 ug via SUBCUTANEOUS
  Filled 2014-04-03: qty 0.5

## 2014-04-03 MED ORDER — DARBEPOETIN ALFA-POLYSORBATE 500 MCG/ML IJ SOLN
100.0000 ug | INTRAMUSCULAR | Status: DC
  Filled 2014-04-03: qty 1

## 2014-04-17 ENCOUNTER — Encounter (HOSPITAL_COMMUNITY)
Admission: RE | Admit: 2014-04-17 | Discharge: 2014-04-17 | Disposition: A | Discharge status: Home / Self Care | Payer: Medicare Other | Source: Ambulatory Visit | Attending: Nephrology | Admitting: Nephrology

## 2014-04-17 DIAGNOSIS — N183 Chronic kidney disease, stage 3 unspecified: Secondary | ICD-10-CM | POA: Insufficient documentation

## 2014-04-17 DIAGNOSIS — D631 Anemia in chronic kidney disease: Secondary | ICD-10-CM | POA: Insufficient documentation

## 2014-04-17 DIAGNOSIS — N039 Chronic nephritic syndrome with unspecified morphologic changes: Secondary | ICD-10-CM | POA: Diagnosis present

## 2014-04-17 LAB — POCT HEMOGLOBIN-HEMACUE: HEMOGLOBIN: 9.3 g/dL — AB (ref 13.0–17.0)

## 2014-04-17 MED ORDER — DARBEPOETIN ALFA-POLYSORBATE 500 MCG/ML IJ SOLN
100.0000 ug | INTRAMUSCULAR | Status: DC
  Filled 2014-04-17: qty 1

## 2014-04-17 MED ORDER — DARBEPOETIN ALFA-POLYSORBATE 100 MCG/0.5ML IJ SOLN
INTRAMUSCULAR | Status: AC
  Administered 2014-04-17: 100 ug via SUBCUTANEOUS
  Filled 2014-04-17: qty 0.5

## 2014-05-01 ENCOUNTER — Encounter (HOSPITAL_COMMUNITY): Payer: Medicare Other

## 2014-05-08 ENCOUNTER — Encounter (HOSPITAL_COMMUNITY)
Admission: RE | Admit: 2014-05-08 | Discharge: 2014-05-08 | Disposition: A | Discharge status: Home / Self Care | Payer: Medicare Other | Source: Ambulatory Visit | Attending: Nephrology | Admitting: Nephrology

## 2014-05-08 DIAGNOSIS — D631 Anemia in chronic kidney disease: Secondary | ICD-10-CM | POA: Diagnosis not present

## 2014-05-08 LAB — RENAL FUNCTION PANEL
ALBUMIN: 3.5 g/dL (ref 3.5–5.2)
ANION GAP: 9 (ref 5–15)
BUN: 49 mg/dL — AB (ref 6–23)
CHLORIDE: 94 meq/L — AB (ref 96–112)
CO2: 41 meq/L — AB (ref 19–32)
Calcium: 8.7 mg/dL (ref 8.4–10.5)
Creatinine, Ser: 1.98 mg/dL — ABNORMAL HIGH (ref 0.50–1.35)
GFR calc Af Amer: 40 mL/min — ABNORMAL LOW (ref >90)
GFR calc non Af Amer: 34 mL/min — ABNORMAL LOW (ref >90)
Glucose, Bld: 103 mg/dL — ABNORMAL HIGH (ref 70–99)
Phosphorus: 3 mg/dL (ref 2.3–4.6)
Potassium: 4 mEq/L (ref 3.7–5.3)
Sodium: 144 mEq/L (ref 137–147)

## 2014-05-08 LAB — IRON AND TIBC
Iron: 41 ug/dL — ABNORMAL LOW (ref 42–135)
Saturation Ratios: 14 % — ABNORMAL LOW (ref 20–55)
TIBC: 291 ug/dL (ref 215–435)
UIBC: 250 ug/dL (ref 125–400)

## 2014-05-08 LAB — HEMOGLOBIN AND HEMATOCRIT, BLOOD
HCT: 21.7 % — ABNORMAL LOW (ref 39.0–52.0)
HEMOGLOBIN: 6.6 g/dL — AB (ref 13.0–17.0)

## 2014-05-08 LAB — FERRITIN: FERRITIN: 576 ng/mL — AB (ref 22–322)

## 2014-05-08 MED ORDER — DARBEPOETIN ALFA-POLYSORBATE 500 MCG/ML IJ SOLN
100.0000 ug | INTRAMUSCULAR | Status: DC
  Filled 2014-05-08: qty 1

## 2014-05-08 MED ORDER — DARBEPOETIN ALFA-POLYSORBATE 100 MCG/0.5ML IJ SOLN
INTRAMUSCULAR | Status: AC
  Administered 2014-05-08: 100 ug via SUBCUTANEOUS
  Filled 2014-05-08: qty 0.5

## 2014-05-08 NOTE — Progress Notes (Signed)
HGB 6.4 by hemocue.  Stat H & H ordered and repeat value was 6.6.  Mary at Dr [employer] office notified.  No orders received at this time.  Pt. Instructed that if he was to have chest pain or shortness of breath to go to the ED.  Pt and wife voice understanding.  Office will follow up.

## 2014-05-11 LAB — POCT HEMOGLOBIN-HEMACUE: Hemoglobin: 6.4 g/dL — CL (ref 13.0–17.0)

## 2014-05-12 ENCOUNTER — Encounter: Payer: Self-pay | Admitting: Internal Medicine

## 2014-05-12 ENCOUNTER — Ambulatory Visit (INDEPENDENT_AMBULATORY_CARE_PROVIDER_SITE_OTHER): Payer: Medicare Other | Admitting: Internal Medicine

## 2014-05-12 VITALS — BP 126/72 | HR 91 | Ht 73.0 in | Wt 305.0 lb

## 2014-05-12 DIAGNOSIS — J9610 Chronic respiratory failure, unspecified whether with hypoxia or hypercapnia: Secondary | ICD-10-CM

## 2014-05-12 DIAGNOSIS — R0609 Other forms of dyspnea: Secondary | ICD-10-CM

## 2014-05-12 DIAGNOSIS — R0989 Other specified symptoms and signs involving the circulatory and respiratory systems: Secondary | ICD-10-CM

## 2014-05-12 DIAGNOSIS — R0689 Other abnormalities of breathing: Secondary | ICD-10-CM

## 2014-05-12 DIAGNOSIS — Z930 Tracheostomy status: Secondary | ICD-10-CM

## 2014-05-12 DIAGNOSIS — R0600 Dyspnea, unspecified: Secondary | ICD-10-CM | POA: Insufficient documentation

## 2014-05-12 NOTE — Patient Instructions (Addendum)
I thin your shortness of breath is from many reasons - weight, copd, diastolic heart dysfunction and deconditioning. VEry difficult to treat. I do not think you are in copd flare up today  PLAN Continue Pulmicort and Brovana Neb Twice daily  , rinse after use.  Continue on Trach Collar during daytime  Continue on Vent support At bedtime   REfer to pulmonary rehab  Follow up Dr. Chase Caller in 6 months and As needed .  Please contact office for sooner follow up if symptoms do not improve or worsen or seek emergency care

## 2014-05-12 NOTE — Progress Notes (Signed)
Subjective:    Patient ID: [name] [name], male    DOB: [birth year], 63 y.o.   MRN: [id number]   HPI   HPI 01/23/11- COPD, CAD/diastolic dysfunction, OSA, chronic respiratory failure, obesity Hypoventilation, cor pulmonale Hosp since last here- once for "shakes"-they told him wasn't using CPAP right, once for epistaxis, once to place urinary stent, and has had a right ? renal biopsy. Told anemic.  Breathing stable, but can't lie flat- feels smothered even on O2.  Runs O2 2-3L/M usually. I gave permision to go to 4 during exertion. Notes "tiresome" feeling midchest, related to exertion/ climbing stairs and relieved by rest. Told to pace himself and not try to push through that.  Discussed need for O2. He doesn't sleep well with his BiPAP 15/12. Discussed sleep hygiene- discussed room temperature. He was more comfortable with autotitration in hosp and we discussed a trial of that at home.  Has restarted diuretic since leg edema has come back.   05/25/11-  COPD, CAD/diastolic dysfunction, OSA, chronic respiratory failure, obesity Hypoventilation, cor pulmonale Went to ER last week- short of breath. Air wasn't satisfying. Was started back on shot for anemia. Feels some better.  Reports sneezing, postnasal drainage and a sense of mucus in his upper chest. Denies fever, sore throat and doesn't think he has a cold.  06/28/11-  COPD, CAD/diastolic dysfunction, OSA, chronic respiratory failure, obesity Hypoventilation, cor pulmonale Recently hospitalized October 25-29, notes reviewed with him and x-ray images reviewed by me. He was hospitalized for hypercapnic respiratory failure with transient encephalopathy and renal insufficiency. Since discharge he has regained some ankle edema while off of furosemide. He has a nephrology appointment later this month. He is concerned that he was taken off his diabetes medicines and is directed to discuss this with his primary physician immediately. He has some  persistent soreness across his anterior chest wall at the level of the xiphoid but otherwise breathing better with less exertional dyspnea and before he went in the hospital. He remains dependent on continuous oxygen at 2 L. He continues his BiPAP machine all night, every night but says he was sleeping better with it  on autotitration with a lower pressure used in the hospital.  09/25/11- COPD, CAD/diastolic dysfunction, OSA, chronic respiratory failure, obesity Hypoventilation, cor pulmonale Since last here he was hospitalized for respiratory failure with CHF and obesity hypoventilation. Better with diuresis. BiPAP 13/10 is now comfortable and used all night every night with supplemental oxygen 4 L. Tolerated colonoscopy without respiratory distress. Uses a rescue inhaler only occasionally but says it does help then.  11/23/11- COPD, CAD/diastolic dysfunction, OSA, chronic respiratory failure, obesity Hypoventilation, cor pulmonale She is noticing some increased shortness of breath on exertion such as getting in the car but no increase in ankle edema which is well controlled. Occasional cough is not progressive or productive. Noticing more rhinorrhea with the pollen. Continues BiPAP  I 13/E 10 all night every night.  02/07/12- COPD, CAD/diastolic dysfunction, OSA, chronic respiratory failure, obesity Hypoventilation, cor pulmonale  pt states doing some better.wakes up out of a nap coughing feels like he's choking.  Post Hosp- 01/14/12- 01/23/12-discharge diagnoses reviewed with him: Coronary artery disease/CABG, diastolic dysfunction, COPD with chronic respiratory failure, acute respiratory failure with hypoxia, acute on chronic renal failure, DM. He was intubated for 4 days and treated with Zosyn, vancomycin and Avelox, Levaquin, Zithromax and Rocephin. Cultures negative. He was to wean off of prednisone over 3 days. He was hoarse after intubation but that  has resolved. He is using oxygen 1-2 L and BiPAP   13/10 at night. He feels he is pretty close to his baseline.  03/28/12-  COPD, CAD/diastolic dysfunction/ chronic CHF, OSA, chronic respiratory failure, obesity Hypoventilation, cor pulmonale  6 MWT today. Pt states breathing has been "okay" since last OV. Pt states having trouble falling alseep-not sleeping well on BiPAP. Pt states mask fit well but thinks the pressure needs to be adjusted again. Currently 13/10+ oxygen at 2 L/ Advanced. He sleeps better in a recliner with oxygen but without his BiPAP. Getting iron injections for anemia. We discussed shortness of breath and anemia. Pain left anterior axillary line described as a sore ache over the past week. He is able to raise his arm. Thinks he maybe lifted something wrong. COPD assessment test (CAT) 21/40 CXR 01/14/12- 1 view- IMPRESSION:  Stable support apparatus. Residual mild interstitial prominence  with slight improvement in aeration. No convincing pulmonary  edema. Probable small left pleural effusion with left basilar  atelectasis or infiltrate.  Original Report Authenticated By: [name], M.D.   03/08/35-UYQI, CAD/diastolic dysfunction/ chronic CHF, OSA, chronic respiratory failure, obesity Hypoventilation, cor pulmonale Hospitalized again between March 9 and 12 for acute on chronic renal failure with congestive heart failure, chronic respiratory failure on BiPAP and acute respiratory failure with hypoxia. He says he is feeling better at this time. Continues oxygen 3 L/Advanced. He has been using BiPAP 13/10 but thinks he slept better with CPAP. We discussed options. Usually when he decompensates it is from fluid overload triggering CO2 retention and hypoventilation.  CXR 10/20/12 IMPRESSION:  Vascular congestion and cardiomegaly, with small bilateral pleural  effusions and bibasilar airspace opacification. Findings are most  compatible with recurrent pulmonary edema, though underlying  pneumonia cannot be excluded.  Original Report  Authenticated By: [name], M.D.  12/02/2012 [facility] follow up  Returns for a post hospital follow up .  Reports doing well overall but still having some SOB, thinks this may be due to his pressure not being high enough on BIPAP. Since decreasing BIPAP 1 month ago  down to 12/10 he does not feel as good. Trouble sleeping.  Was admitted for decompensated cor pulmonale w/ fluid overload with subsequent acute on chronic hypercarbic resp failure.  He admits he can not afford his advair and spiriva- therefore he is not taking.  He does not wear BIPAP everynight. We discussed the implications of this and untreated OHS/OSA. He naps a lot but does not wear BIPAP.  He was treated with diuresis with improvement.  Discharged on Lasix 40mg  . Lower dose due to bump in scr. Has OP follow up with Renal d/t renal insufficiency.   02/05/13- COPD, CAD/diastolic dysfunction/ chronic CHF, OSA, chronic respiratory failure, obesity Hypoventilation, cor pulmonale Hosp 6/16-6/19/14 Acute on Chronic Resp Failure Discharge Plan:  -pt educated on BiPAP compliance at home  -continue pulmicort, scheduled albuterol / atrovent (med cost has been an issue)  -wean prednisone to off  -given information for [employer] at [employer]. Pulmonary Rehab was a cost issue (45$ per visit)  -RN home health to review medications and reinforce need for BiPAP 12/12 wO2 2-3L for sleep, O2 3-4 awake.  FOLLOWS FOR: Breathing has improved since leaving the hospital. Reports DOE, chest tightness with exertion and slight coughing from time to time. Wife needs FMLA form. CXR 01/29/13 1View IMPRESSION:  Slightly improved aeration in the lungs may represent decreasing  edema.  Original Report Authenticated By: Markus Daft, M.  04/11/13- COPD, CAD/diastolic dysfunction/ chronic CHF, OSA, chronic respiratory failure, obesity Hypoventilation, cor pulmonale FOLLOWS FOR: reports breathing is essentially unchanged but he feels like he's  "burning through more oxygen than I used to." denies wheezing, increased SOB, tightness, increased cough, f/c/s Just in hospital again acute on chronic respiratory failure with CHF and COPD. Today he feels well except for frontal headache. Continuing oxygen at 2.5 L. BIPAP Auto 5-15/ O2 2L/ Advanced CXR 1V 8/14 IMPRESSION:  1. Extubated, enteric tube removed. Stable lung volumes.  2. Stable ventilation. Small pleural effusions.  Electronically Signed  By: Lars Pinks  On: 04/02/2013 07:43  06/23/2013 [facility] follow up  Patient presents for a post hospital followup. Patient was admitted October 7 through October 30 for acute on chronic respiratory failure. Patient did require intubation for airway protection in the emergency room due to acute encephalopathy. Unfortunately, patient was unable to be weaned from the ventilator and underwent a tracheostomy. Patient was weaned to trach collar during the daytime and vent support. At bedtime. Lurline Idol was placed by ENT Dr. Redmond Baseman. He was transitioned to an extra long #6 trach prior to discharge.  Patient returns today accompanied by his wife. Patient reports that he has been doing okay. He strength. He is tolerating vent support at night. However, he does feel that this is uncomfortable in certain times. Vent settings at night are PRVC at a rate of 14 with a tidal volume of 450 cc with FiO2 of 40%, and a PEEP of 5. Patient has been using Passy-Muir valve for speech however, over the last couple days, feels it's been more difficulties than his usual. He does have follow with ENT later this week for trach evaluation. Patient denies any hemoptysis, trach site. Bleeding, Orthopnea, PND, or increased leg swelling. Has been eating okay without any noted , dysphagia. Appetite is decreased with no nausea, vomiting, diarrhea.  REC Continue on current regimen.  follow up with ENT this week for [facility] evaluation.  I will call with labs and xray results.    Continue on Trach Collar during daytime  Continue on Vent support At bedtime   Follow up Dr. Chase Caller in 6-8 weeks and As needed .  Please contact office for sooner follow up if symptoms do not improve or worsen or seek emergency care   Late Add : ? Right sided infiltrate  Add Levaquin 500mg  daily x 7 days #7 , no refills.    OV 07/21/2013 Followup chronic respiratory failure status post tracheostomy. Since seeing my nurse practitioner a month ago he is doing well. Uses ventilator at night and daytime is on tracheostomy collar. He is not compliant with his Passy-Muir valve because it gives him a sensation of suffocation. But he is able to talk with open tracheostomy. He still reports chronic stable dyspnea on exertion for walking a block or sometimes half a block only. This is relieved by rest. Rated as moderate. There is no associated chest pain no worsening cough or phlegm. He did have some hemoptysis yesterday and he is trying to get in contact with the ENT specialist. As noted just beside bleeding. He denies any orthopnea or paroxysmal nocturnal dyspnea. Eating okay.  Lab review I do note that is not had a CT scan of the chest since 2012 rec Glad you are doing well CMA will refer you to pumonary rehab on basis of chronic resp failure Please do CT scan chest for pleural fibrosis, pleural plaques Followup with ENT> Dr Redmond Baseman, changed trach  on 12/15  07/31/2013 f/u ov/Wert re: worse since 07/24/13 (day of CT Chest - see below) Chief Complaint  Patient presents with  . Acute Visit    Pt c/o increased SOB for the past several days. He states gets out of breath with any sort of exertion.  He has minimal cough that is non prod.   mucus is clear/ not bloody Baseline use of alb not much at all,  One every few days at most Assoc with increased fluid in legs  But followed by Gwenlyn Found who adjusts his lasix  No obvious patterns in day to day or daytime variabilty or assoc chronic cough or cp or  chest tightness, subjective wheeze overt sinus or hb symptoms. No unusual exp hx or h/o childhood pna/ asthma or knowledge of premature birth.  Sleeping ok without nocturnal  or early am exacerbation  of respiratory  c/o's or need for noct saba. Also denies any obvious fluctuation of symptoms with weather or environmental changes or other aggravating or alleviating factors except as outlined above    09/15/2013 Follow up  Returns for a 2 month follow up for COPD, chronic resp failure -trach dependent.  Reports breathing has worsened x2 weeks w/ increased SOB, chest tightness, cough with white mucus. Coughing and congestion make it difficult to use vent at night . Denies any f/c/s, nausea, vomiting, hemoptysis, edema.  Was referred to pulmonary rehab however could not afford co-pay amounts. We discussed looking into pt assistance.    OV 10/28/2013  Chief Complaint  Patient presents with  . Follow-up    Pt is c/o rattling in his chest, and dry cough. Pt also c/o increased swelling in both ankles.     Followup chronic respiratory failure status post tracheostomy by ENT. Chronic respiratory failure on account of COPD, OSA, diastolic heart failure  - Overall he is stable. He reports using the ventilator at night but occasionally he is noncompliant. When he does not use the ventilator the next day he is more fatigued according to his wife. In the daytime he is on trach collar on room air of liters of oxygen. He occasionally uses his Passy-Muir valve for talk but otherwise feels a sense of suffocation. The last few weeks he's noticed some increased pedal edema and despite increasing and self titrating his Lasix. In association with this he is more rattling in his chest and more shortness of breath and cough but there is no change in sputum color. The sputum is not pink and frothy. He has not seen his cardiologist Dr.? Dr Adora Fridge a while and he plans to see him more expeditiously. Denies any chest pain  cough fever chills or wheezing.  Copd appears stable However, diastolic chf might be worse My office will help you seen by Dr Gwenlyn Found office next day or two  REturn to see me in 4 months or sooner if needed  - continue trach care through ENT clinic   OV 02/24/2014  Chief Complaint  Patient presents with  . Follow-up    Pt c/o increase SOB, cough with clear mucous, intermittent edema in BIL ankles. C/o upper mid sternal CP and left upper CP when on ventilator.    Followup chronic respiratory failure on account of obesity, sleep apnea, COPD and diastolic heart failure  - He continues to feel poorly. For the last month or so he produces increased shortness of breath associated with worsening pedal edema. Is only on Lasix 40 mg once daily. He says he  is compliant with his nebulizers. Although, he is noncompliant with his ventilator stating that the pressures are too high. He probably uses it less than 50% of the time. In addition he is very keen on getting his tracheostomy removed permanently this is despite our advice that he keep it in permanently. He is frustrated by his quality of life. I've explained to him that weight loss is paramount but he is not too keen on this. He reports that his cough, sputum or in sputum color or roughly the same as baseline without any fever >>pred taper , increased lasix 40mg  Twice daily    03/10/14 Follow up COPD/OHS/ Trach Depend RF /Noct Vent Returns for follow up for COPD . Seen last ov with COPD flare and suspected decompensated Diastolic CHF. He was treated with prednisone taper and diuretics were increased.  He says he is some better but gets winded easily.  Encouraged to use vent at night .  Leg swelling slightly better. Labs last week showed scr stable.  No chest pain , hemoptyiss, fever or n/vd/     O 05/12/2014  Chief Complaint  Patient presents with  . Follow-up    Pt states his breathing has slightly worsened since last OV. Pt states he has a  slight increase in SOB, non prod cough and chest pain when coming off the vent in the morning.    Followup chronic respiratory failure on account of obesity, sleep apnea, COPD and diastolic heart failure and Trach Depend RF /Noct Vent   - presents with wife. Continues to report frustration having trach in life; more resigned to having it. He is frustrated by class 3 exertional fatigue and dyspnea. REcently hgb 6.3gm% at renal; no obvious bleed and renal managing it but dyspnea is chronic. Noc hange in baseline cough and white sputum of moderate intensity. Continues vent at night; more compliant.  Uptodate with flu shot. He has cards appt pending in  <  2 months per hx   Review of Systems  Constitutional: Positive for fatigue. Negative for fever and unexpected weight change.  HENT: Negative for congestion, dental problem, ear pain, nosebleeds, postnasal drip, rhinorrhea, sinus pressure, sneezing, sore throat and trouble swallowing.   Eyes: Negative for redness and itching.  Respiratory: Positive for cough and shortness of breath. Negative for chest tightness and wheezing.   Cardiovascular: Positive for chest pain. Negative for palpitations and leg swelling.  Gastrointestinal: Positive for constipation. Negative for nausea and vomiting.  Genitourinary: Negative for dysuria.  Musculoskeletal: Negative for joint swelling.  Skin: Negative for rash.  Neurological: Negative for headaches.  Hematological: Does not bruise/bleed easily.  Psychiatric/Behavioral: Negative for dysphoric mood. The patient is not nervous/anxious.        Objective:   Physical Exam  Filed Vitals:   05/12/14 1339  BP: 126/72  Pulse: 91  Height: 6\' 1"  (1.854 m)  Weight: 305 lb (138.347 kg)  SpO2: 99%      HENT:  Head: Normocephalic and atraumatic.  Right Ear: External ear normal.  Left Ear: External ear normal.  Mouth/Throat: Oropharynx is clear and moist. No oropharyngeal exudate.  Trach +. Speaking through  Brielle  Eyes: Conjunctivae and EOM are normal. Pupils are equal, round, and reactive to light. Right eye exhibits no discharge. Left eye exhibits no discharge. No scleral icterus.  Neck: Normal range of motion. Neck supple. No JVD present. No tracheal deviation present. No thyromegaly present.  Cardiovascular: Normal rate, regular rhythm and intact distal pulses.  Exam  reveals no gallop and no friction rub.   No murmur heard. Pulmonary/Chest: Effort normal and breath sounds normal. No respiratory distress. He has no wheezes. He has no rales. He exhibits no tenderness.  Abdominal: Soft. Bowel sounds are normal. He exhibits no distension and no mass. There is no tenderness. There is no rebound and no guarding.  Musculoskeletal: Normal range of motion. He exhibits edema. He exhibits no tenderness.  + edema  Lymphadenopathy:    He has no cervical adenopathy.  Neurological: He is alert and oriented to person, place, and time. He has normal reflexes. No cranial nerve deficit. Coordination normal.  Skin: Skin is warm and dry. No rash noted. He is not diaphoretic. No erythema. No pallor.  Psychiatric: He has a normal mood an     Assessment & Plan:  #Chronic respiratory failure  I think your shortness of breath is from many reasons - weight, copd, diastolic heart dysfunction and deconditioning. VEry difficult to treat. I do not think you are in copd flare up today  PLAN Continue Pulmicort and Brovana Neb Twice daily  , rinse after use.  Continue on Trach Collar during daytime  Continue on Vent support At bedtime   REfer to pulmonary rehab  Follow up Dr. Chase Caller in 6 months and As needed .  Please contact office for sooner follow up if symptoms do not improve or worsen or seek emergency care   Dr. Brand Males, M.D., [facility].C.P Pulmonary and Critical Care Medicine Staff Physician [place] [facility] Pager: [phone], If no answer or between  15:00h -  7:00h: call [phone]  05/12/2014 1:57 PM

## 2014-05-14 ENCOUNTER — Telehealth: Payer: Self-pay | Admitting: Internal Medicine

## 2014-05-14 NOTE — Telephone Encounter (Signed)
Called and spoke to pt. Informed pt Brovana neb will be up front for pt. Pt verbalized understanding and denied any further questions or concerns at this time.

## 2014-05-21 ENCOUNTER — Other Ambulatory Visit (HOSPITAL_COMMUNITY): Payer: Self-pay | Admitting: *Deleted

## 2014-05-22 ENCOUNTER — Encounter (HOSPITAL_COMMUNITY)
Admission: RE | Admit: 2014-05-22 | Discharge: 2014-05-22 | Disposition: A | Discharge status: Home / Self Care | Payer: Medicare Other | Source: Ambulatory Visit | Attending: Nephrology | Admitting: Nephrology

## 2014-05-22 ENCOUNTER — Encounter (HOSPITAL_COMMUNITY): Payer: Medicare Other

## 2014-05-22 DIAGNOSIS — N183 Chronic kidney disease, stage 3 (moderate): Secondary | ICD-10-CM | POA: Insufficient documentation

## 2014-05-22 DIAGNOSIS — D631 Anemia in chronic kidney disease: Secondary | ICD-10-CM | POA: Insufficient documentation

## 2014-05-22 MED ORDER — DARBEPOETIN ALFA-POLYSORBATE 100 MCG/0.5ML IJ SOLN
INTRAMUSCULAR | Status: AC
  Administered 2014-05-22: 100 ug via SUBCUTANEOUS
  Filled 2014-05-22: qty 0.5

## 2014-05-22 MED ORDER — SODIUM CHLORIDE 0.9 % IV SOLN
1020.0000 mg | Freq: Once | INTRAVENOUS | Status: AC
  Administered 2014-05-22: 1020 mg via INTRAVENOUS
  Filled 2014-05-22: qty 34

## 2014-05-22 MED ORDER — DARBEPOETIN ALFA-POLYSORBATE 500 MCG/ML IJ SOLN
100.0000 ug | INTRAMUSCULAR | Status: DC
  Filled 2014-05-22: qty 1

## 2014-05-22 NOTE — Progress Notes (Signed)
Patient's Hgb 6.0.  Jashan Cotten at Dr. Elissa Hefty office notified.  Orders received to give Feraheme 1020mg  IV x 1 and to give patient Aranesp injection. Will continue to monitor patient.

## 2014-05-25 LAB — POCT HEMOGLOBIN-HEMACUE: Hemoglobin: 6 g/dL — CL (ref 13.0–17.0)

## 2014-05-29 ENCOUNTER — Telehealth (HOSPITAL_COMMUNITY): Payer: Self-pay

## 2014-05-29 NOTE — Telephone Encounter (Signed)
I have called and left a message with Sabastien to inquire about participation in Pulmonary Rehab. Will send letter in mail and follow up.

## 2014-06-01 ENCOUNTER — Telehealth (HOSPITAL_COMMUNITY): Payer: Self-pay

## 2014-06-01 NOTE — Telephone Encounter (Signed)
Called patient regarding entrance to Pulmonary Rehab.  Patient states that they are interested in attending the program.  Dillinger is going to verify insurance coverage and follow up.

## 2014-06-05 ENCOUNTER — Encounter (HOSPITAL_COMMUNITY)
Admission: RE | Admit: 2014-06-05 | Discharge: 2014-06-05 | Disposition: A | Discharge status: Home / Self Care | Payer: Medicare Other | Source: Ambulatory Visit | Attending: Nephrology | Admitting: Nephrology

## 2014-06-05 DIAGNOSIS — D631 Anemia in chronic kidney disease: Secondary | ICD-10-CM | POA: Diagnosis not present

## 2014-06-05 LAB — RENAL FUNCTION PANEL
Albumin: 3.4 g/dL — ABNORMAL LOW (ref 3.5–5.2)
Anion gap: 10 (ref 5–15)
BUN: 44 mg/dL — ABNORMAL HIGH (ref 6–23)
CALCIUM: 9.4 mg/dL (ref 8.4–10.5)
CO2: 41 meq/L — AB (ref 19–32)
CREATININE: 1.82 mg/dL — AB (ref 0.50–1.35)
Chloride: 90 mEq/L — ABNORMAL LOW (ref 96–112)
GFR calc Af Amer: 44 mL/min — ABNORMAL LOW (ref >90)
GFR calc non Af Amer: 38 mL/min — ABNORMAL LOW (ref >90)
Glucose, Bld: 152 mg/dL — ABNORMAL HIGH (ref 70–99)
Phosphorus: 2.3 mg/dL (ref 2.3–4.6)
Potassium: 4 mEq/L (ref 3.7–5.3)
Sodium: 141 mEq/L (ref 137–147)

## 2014-06-05 MED ORDER — DARBEPOETIN ALFA-POLYSORBATE 500 MCG/ML IJ SOLN
100.0000 ug | INTRAMUSCULAR | Status: DC
  Filled 2014-06-05: qty 1

## 2014-06-05 MED ORDER — DARBEPOETIN ALFA-POLYSORBATE 100 MCG/0.5ML IJ SOLN
INTRAMUSCULAR | Status: AC
  Administered 2014-06-05: 100 ug via SUBCUTANEOUS
  Filled 2014-06-05: qty 0.5

## 2014-06-08 LAB — POCT HEMOGLOBIN-HEMACUE: HEMOGLOBIN: 7.6 g/dL — AB (ref 13.0–17.0)

## 2014-06-12 ENCOUNTER — Encounter (HOSPITAL_COMMUNITY)
Admission: RE | Admit: 2014-06-12 | Discharge: 2014-06-12 | Disposition: A | Discharge status: Home / Self Care | Payer: Medicare Other | Source: Ambulatory Visit | Attending: Internal Medicine | Admitting: Internal Medicine

## 2014-06-18 ENCOUNTER — Inpatient Hospital Stay (HOSPITAL_COMMUNITY): Admission: RE | Admit: 2014-06-18 | Payer: Medicare Other | Source: Ambulatory Visit

## 2014-06-19 ENCOUNTER — Encounter (HOSPITAL_COMMUNITY)
Admission: RE | Admit: 2014-06-19 | Discharge: 2014-06-19 | Disposition: A | Discharge status: Home / Self Care | Payer: Medicare Other | Source: Ambulatory Visit | Attending: Nephrology | Admitting: Nephrology

## 2014-06-19 DIAGNOSIS — D631 Anemia in chronic kidney disease: Secondary | ICD-10-CM | POA: Diagnosis present

## 2014-06-19 DIAGNOSIS — N183 Chronic kidney disease, stage 3 (moderate): Secondary | ICD-10-CM | POA: Insufficient documentation

## 2014-06-19 LAB — COMPREHENSIVE METABOLIC PANEL
ALT: 8 U/L (ref 0–53)
ANION GAP: 7 (ref 5–15)
AST: 13 U/L (ref 0–37)
Albumin: 3.4 g/dL — ABNORMAL LOW (ref 3.5–5.2)
Alkaline Phosphatase: 82 U/L (ref 39–117)
BUN: 50 mg/dL — AB (ref 6–23)
CALCIUM: 9.6 mg/dL (ref 8.4–10.5)
CO2: 44 mEq/L (ref 19–32)
CREATININE: 1.81 mg/dL — AB (ref 0.50–1.35)
Chloride: 93 mEq/L — ABNORMAL LOW (ref 96–112)
GFR, EST AFRICAN AMERICAN: 45 mL/min — AB (ref >90)
GFR, EST NON AFRICAN AMERICAN: 38 mL/min — AB (ref >90)
GLUCOSE: 125 mg/dL — AB (ref 70–99)
Potassium: 4.5 mEq/L (ref 3.7–5.3)
SODIUM: 144 meq/L (ref 137–147)
TOTAL PROTEIN: 7.5 g/dL (ref 6.0–8.3)
Total Bilirubin: <0.2 mg/dL — ABNORMAL LOW (ref 0.3–1.2)

## 2014-06-19 LAB — CBC
HEMATOCRIT: 26.3 % — AB (ref 39.0–52.0)
HEMOGLOBIN: 7.6 g/dL — AB (ref 13.0–17.0)
MCH: 27.7 pg (ref 26.0–34.0)
MCHC: 28.9 g/dL — ABNORMAL LOW (ref 30.0–36.0)
MCV: 96 fL (ref 78.0–100.0)
Platelets: 185 10*3/uL (ref 150–400)
RBC: 2.74 MIL/uL — ABNORMAL LOW (ref 4.22–5.81)
RDW: 14.3 % (ref 11.5–15.5)
WBC: 4.7 10*3/uL (ref 4.0–10.5)

## 2014-06-19 LAB — IRON AND TIBC
IRON: 45 ug/dL (ref 42–135)
SATURATION RATIOS: 18 % — AB (ref 20–55)
TIBC: 245 ug/dL (ref 215–435)
UIBC: 200 ug/dL (ref 125–400)

## 2014-06-19 LAB — URINALYSIS, ROUTINE W REFLEX MICROSCOPIC
Bilirubin Urine: NEGATIVE
Glucose, UA: NEGATIVE mg/dL
Hgb urine dipstick: NEGATIVE
KETONES UR: NEGATIVE mg/dL
LEUKOCYTES UA: NEGATIVE
NITRITE: NEGATIVE
PROTEIN: NEGATIVE mg/dL
Specific Gravity, Urine: 1.01 (ref 1.005–1.030)
Urobilinogen, UA: 0.2 mg/dL (ref 0.0–1.0)
pH: 5.5 (ref 5.0–8.0)

## 2014-06-19 LAB — PHOSPHORUS: Phosphorus: 3 mg/dL (ref 2.3–4.6)

## 2014-06-19 LAB — URIC ACID: Uric Acid, Serum: 6.6 mg/dL (ref 4.0–7.8)

## 2014-06-19 LAB — FERRITIN: Ferritin: 592 ng/mL — ABNORMAL HIGH (ref 22–322)

## 2014-06-19 MED ORDER — DARBEPOETIN ALFA 100 MCG/0.5ML IJ SOSY
100.0000 ug | PREFILLED_SYRINGE | INTRAMUSCULAR | Status: DC
  Administered 2014-06-19: 100 ug via SUBCUTANEOUS

## 2014-06-19 MED ORDER — DARBEPOETIN ALFA 100 MCG/0.5ML IJ SOSY
PREFILLED_SYRINGE | INTRAMUSCULAR | Status: AC
  Filled 2014-06-19: qty 0.5

## 2014-06-30 ENCOUNTER — Encounter: Payer: Self-pay | Admitting: Cardiovascular Disease

## 2014-06-30 ENCOUNTER — Ambulatory Visit (INDEPENDENT_AMBULATORY_CARE_PROVIDER_SITE_OTHER): Payer: Medicare Other | Admitting: Cardiovascular Disease

## 2014-06-30 VITALS — BP 122/72 | HR 94 | Ht 73.0 in | Wt 316.7 lb

## 2014-06-30 DIAGNOSIS — I5032 Chronic diastolic (congestive) heart failure: Secondary | ICD-10-CM

## 2014-06-30 DIAGNOSIS — I110 Hypertensive heart disease with heart failure: Secondary | ICD-10-CM

## 2014-06-30 DIAGNOSIS — I509 Heart failure, unspecified: Secondary | ICD-10-CM

## 2014-06-30 DIAGNOSIS — I2510 Atherosclerotic heart disease of native coronary artery without angina pectoris: Secondary | ICD-10-CM

## 2014-06-30 DIAGNOSIS — I739 Peripheral vascular disease, unspecified: Secondary | ICD-10-CM

## 2014-06-30 NOTE — Assessment & Plan Note (Signed)
History of CAD status post bypass surgery in 2001. He's had multiple procedures and interventions since most recently RCA SVG stenting 7/11. He had normal LV function at that time. Myoview stress testing performed in February 2014 revealed an EF of 62% with no evidence of ischemia. He denies chest pain but is chronically short of breath and has a trach collar.

## 2014-06-30 NOTE — Progress Notes (Signed)
06/30/2014 [name] [name]   1950/11/25  [id number]  Primary Physician [name] Fendt, MD Primary Cardiologist: [name] Harp MD [name] [name]   HPI:  The patient is a 63 year old obese African American male with history of coronary artery disease, diastolic dysfunction, COPD who is O2 dependent, peripheral vascular disease, chronic renal insufficiency stage 3, diabetes mellitus, hypertension and obstructive sleep apnea. I last saw him in the office one year ago.The patient was hospitalized earlier in the year with acute respiratory failure and hypoxia, likely related to volume overload from diastolic dyslipidemia. He was diuresed with IV Lasix, metolazone. He was also seen from April 3rd to April 7th, apparently with a COPD exacerbation. The patient has a stent in the left SFA which was put in in September of 2009 for claudication. He also does have a short-segment occlusion of the right mid SFA with tibioperoneal disease as well. He had coronary artery bypass grafting in 2001 with PCI stenting of his OM branch in December of 2005. Last cardiac catheterization was in July of 2011 revealing a patent LIMA to the LAD, 80% stenosis beyond that, and a patent vein to the large venous branch, a total vein to the PDA which was restented using a Taxus ion drug-eluting stent with a patent circumflex to the obtuse marginal branch and normal LV function.   His last nuclear stress test was February of this year. His ejection fraction was 62%; it was a low risk study with normal nuclear imaging. No significant change from previous study. This study was done for ST changes to his EKG which are still present and look similar to previous EKG with a right bundle branch block and slightly tachycardic at 104.   The patient presents today for followup. He states that his lower extremity edema has been getting better for the last week, and in actuality his weight is down 8 pounds from the last office  visit in March. He has been taking Lasix 80 mg daily. He does sleep with 2 pillows, and he does complain of some dyspnea on exertion which is better with rest. He also complains of some chest pain which comes and goes, typically with walking. He has taken nitroglycerin once last week and it seemed to help. He denies nausea, vomiting, fever, orthopnea, PND, abdominal pain, hematuria, hematochezia.  He was last seen in the office 12/11/12 by [name] Craw PA-C. I saw him 6 months ago. He has remained stable. His medications have been adjusted including his diuretic as long-acting oral nitrate. He has lost 20 pounds in last 6 months and no longer has lower extremity edema.   [name] [name] saw [name] Ransom PA-C in the office 6 months ago. He's remained stable since. Dr. Chase [name] follows him from a pulmonary point of view.    Current Outpatient Prescriptions  Medication Sig Dispense Refill  . acetaminophen (TYLENOL) 160 MG/5ML solution Take 20.3 mLs (650 mg total) by mouth every 4 (four) hours as needed for fever. 120 mL 0  . albuterol (PROVENTIL) (2.5 MG/3ML) 0.083% nebulizer solution Take 3 mLs (2.5 mg total) by nebulization every 4 (four) hours as needed for wheezing. 360 mL 0  . allopurinol (ZYLOPRIM) 100 MG tablet Take 1 tablet by mouth daily.    Marland Kitchen arformoterol (BROVANA) 15 MCG/2ML NEBU Take 2 mLs (15 mcg total) by nebulization 2 (two) times daily. 120 mL 1  . aspirin 81 MG chewable tablet Chew 1 tablet (81 mg total) by mouth daily.    Marland Kitchen  budesonide (PULMICORT) 0.5 MG/2ML nebulizer solution Take 2 mLs (0.5 mg total) by nebulization 2 (two) times daily. 120 mL 1  . clopidogrel (PLAVIX) 75 MG tablet Take 1 tablet by mouth daily.    Marland Kitchen COLCRYS 0.6 MG tablet Take 0.6 mg by mouth daily as needed.     . furosemide (LASIX) 40 MG tablet Take 1 tablet (40 mg total) by mouth 2 (two) times daily. 90 tablet 1  . metolazone (ZAROXOLYN) 2.5 MG tablet Take 2.5 mg M/W/F 30 minutes before Lasix 30 tablet 3  . nitroGLYCERIN  (NITROSTAT) 0.4 MG SL tablet Place 1 tablet (0.4 mg total) under the tongue every 5 (five) minutes as needed. For chest pain 25 tablet 6  . traMADol (ULTRAM) 50 MG tablet Take 1 tablet (50 mg total) by mouth every 6 (six) hours as needed for pain.  0   No current facility-administered medications for this visit.    Allergies  Allergen Reactions  . Amlodipine Besy-Benazepril Hcl Swelling    Lips swell  . Percocet [Oxycodone-Acetaminophen] Other (See Comments)    hallucinations    History   Social History  . Marital Status: Married    Spouse Name: N/A    Number of Children: N/A  . Years of Education: N/A   Occupational History  . Not on file.   Social History Main Topics  . Smoking status: Former Smoker -- 1.00 packs/day for 25 years    Types: Cigarettes    Quit date: 08/14/1998  . Smokeless tobacco: Never Used  . Alcohol Use: Yes     Comment: social  . Drug Use: No  . Sexual Activity: Yes   Other Topics Concern  . Not on file   Social History Narrative     Review of Systems: General: negative for chills, fever, night sweats or weight changes.  Cardiovascular: negative for chest pain, dyspnea on exertion, edema, orthopnea, palpitations, paroxysmal nocturnal dyspnea or shortness of breath Dermatological: negative for rash Respiratory: negative for cough or wheezing Urologic: negative for hematuria Abdominal: negative for nausea, vomiting, diarrhea, bright red blood per rectum, melena, or hematemesis Neurologic: negative for visual changes, syncope, or dizziness All other systems reviewed and are otherwise negative except as noted above.    Blood pressure 122/72, pulse 94, height 6\' 1"  (1.854 m), weight 316 lb 11.2 oz (143.654 kg).  General appearance: alert and no distress Neck: no adenopathy, no carotid bruit, no JVD, supple, symmetrical, trachea midline and thyroid not enlarged, symmetric, no tenderness/mass/nodules Lungs: clear to auscultation  bilaterally Heart: regular rate and rhythm, S1, S2 normal, no murmur, click, rub or gallop Extremities: 2+ pitting edema bilaterally with venous stasis changes  EKG normal sinus rhythm at 94 with right bundle branch block unchanged from prior EKGs. I personally reviewed this EKG  ASSESSMENT AND PLAN:   Coronary atherosclerosis History of CAD status post bypass surgery in 2001. He's had multiple procedures and interventions since most recently RCA SVG stenting 7/11. He had normal LV function at that time. Myoview stress testing performed in February 2014 revealed an EF of 62% with no evidence of ischemia. He denies chest pain but is chronically short of breath and has a trach collar.  Chronic diastolic CHF (congestive heart failure) History of normal LV systolic function with grade 1 diastolic dysfunction and chronic lower extremity edema on high-dose diuretics. He has been instructed to take when necessary diuretics should his weight increased and/or his edema increased.  PVD (peripheral vascular disease) History of peripheral arterial  disease status post left SFA PTA in September 2009 with a known occluded right SFA. He really denies claudication I suspect because he is not particularly ambulatory.      [name] Harp MD FACP,FACC,FAHA, [facility] 06/30/2014 2:01 PM

## 2014-06-30 NOTE — Assessment & Plan Note (Signed)
History of normal LV systolic function with grade 1 diastolic dysfunction and chronic lower extremity edema on high-dose diuretics. He has been instructed to take when necessary diuretics should his weight increased and/or his edema increased.

## 2014-06-30 NOTE — Assessment & Plan Note (Signed)
History of peripheral arterial disease status post left SFA PTA in September 2009 with a known occluded right SFA. He really denies claudication I suspect because he is not particularly ambulatory.

## 2014-06-30 NOTE — Patient Instructions (Signed)
We request that you follow-up in: 6 months with [name], PA-C and in 12 months with Dr Gwenlyn Found.  You will receive a reminder letter in the mail two months in advance. If you don't receive a letter, please call our office to schedule the follow-up appointment.

## 2014-07-03 ENCOUNTER — Encounter (HOSPITAL_COMMUNITY)
Admission: RE | Admit: 2014-07-03 | Discharge: 2014-07-03 | Disposition: A | Discharge status: Home / Self Care | Payer: Medicare Other | Source: Ambulatory Visit | Attending: Nephrology | Admitting: Nephrology

## 2014-07-03 DIAGNOSIS — D631 Anemia in chronic kidney disease: Secondary | ICD-10-CM | POA: Diagnosis not present

## 2014-07-03 LAB — RENAL FUNCTION PANEL
Albumin: 3.3 g/dL — ABNORMAL LOW (ref 3.5–5.2)
Anion gap: 6 (ref 5–15)
BUN: 52 mg/dL — ABNORMAL HIGH (ref 6–23)
CO2: 43 meq/L — AB (ref 19–32)
Calcium: 9.3 mg/dL (ref 8.4–10.5)
Chloride: 91 mEq/L — ABNORMAL LOW (ref 96–112)
Creatinine, Ser: 1.98 mg/dL — ABNORMAL HIGH (ref 0.50–1.35)
GFR calc non Af Amer: 34 mL/min — ABNORMAL LOW (ref >90)
GFR, EST AFRICAN AMERICAN: 40 mL/min — AB (ref >90)
GLUCOSE: 97 mg/dL (ref 70–99)
Phosphorus: 2.1 mg/dL — ABNORMAL LOW (ref 2.3–4.6)
Potassium: 3.9 mEq/L (ref 3.7–5.3)
SODIUM: 140 meq/L (ref 137–147)

## 2014-07-03 LAB — POCT HEMOGLOBIN-HEMACUE: Hemoglobin: 7.5 g/dL — ABNORMAL LOW (ref 13.0–17.0)

## 2014-07-03 MED ORDER — DARBEPOETIN ALFA 100 MCG/0.5ML IJ SOSY
PREFILLED_SYRINGE | INTRAMUSCULAR | Status: AC
  Administered 2014-07-03: 100 ug via SUBCUTANEOUS
  Filled 2014-07-03: qty 0.5

## 2014-07-03 MED ORDER — DARBEPOETIN ALFA 100 MCG/0.5ML IJ SOSY: 100.0000 ug | PREFILLED_SYRINGE | INTRAMUSCULAR | Status: DC

## 2014-07-13 ENCOUNTER — Telehealth: Payer: Self-pay | Admitting: Cardiovascular Disease

## 2014-07-13 NOTE — Telephone Encounter (Signed)
She wants to know if you received fax from 07-08-14. She need this back asap please.

## 2014-07-13 NOTE — Telephone Encounter (Signed)
Okay to stop Plavix for colonoscopy

## 2014-07-13 NOTE — Telephone Encounter (Signed)
Dr. Gwenlyn Found reviewed chart. Paper authorizing medication to be held faxed back to [phone]

## 2014-07-13 NOTE — Telephone Encounter (Signed)
Please Advise

## 2014-07-13 NOTE — Telephone Encounter (Signed)
Surgical clearance fax received. Routed to [place], Therapist, sports

## 2014-07-14 ENCOUNTER — Other Ambulatory Visit: Payer: Self-pay | Admitting: Cardiovascular Disease

## 2014-07-14 ENCOUNTER — Other Ambulatory Visit: Payer: Self-pay | Admitting: Internal Medicine

## 2014-07-14 MED ORDER — CLOPIDOGREL BISULFATE 75 MG PO TABS: 75.0000 mg | ORAL_TABLET | Freq: Every day | ORAL | Status: DC

## 2014-07-14 NOTE — Telephone Encounter (Signed)
Rx was sent to pharmacy electronically. 

## 2014-07-14 NOTE — Telephone Encounter (Signed)
Pt need a new prescription for his generic Plavix. Please call to [facility] -[phone]

## 2014-07-17 ENCOUNTER — Other Ambulatory Visit (HOSPITAL_COMMUNITY): Payer: Self-pay | Admitting: Internal Medicine

## 2014-07-17 ENCOUNTER — Ambulatory Visit (HOSPITAL_COMMUNITY)
Admission: RE | Admit: 2014-07-17 | Discharge: 2014-07-17 | Disposition: A | Discharge status: Home / Self Care | Payer: Medicare Other | Source: Ambulatory Visit | Attending: Internal Medicine | Admitting: Internal Medicine

## 2014-07-17 ENCOUNTER — Encounter (HOSPITAL_COMMUNITY)
Admission: RE | Admit: 2014-07-17 | Discharge: 2014-07-17 | Disposition: A | Discharge status: Home / Self Care | Payer: Medicare Other | Source: Ambulatory Visit | Attending: Nephrology | Admitting: Nephrology

## 2014-07-17 DIAGNOSIS — D631 Anemia in chronic kidney disease: Secondary | ICD-10-CM | POA: Diagnosis not present

## 2014-07-17 DIAGNOSIS — R0989 Other specified symptoms and signs involving the circulatory and respiratory systems: Secondary | ICD-10-CM | POA: Diagnosis not present

## 2014-07-17 DIAGNOSIS — N183 Chronic kidney disease, stage 3 (moderate): Secondary | ICD-10-CM | POA: Insufficient documentation

## 2014-07-17 DIAGNOSIS — R05 Cough: Secondary | ICD-10-CM | POA: Insufficient documentation

## 2014-07-17 DIAGNOSIS — R0602 Shortness of breath: Secondary | ICD-10-CM | POA: Diagnosis not present

## 2014-07-17 DIAGNOSIS — J441 Chronic obstructive pulmonary disease with (acute) exacerbation: Secondary | ICD-10-CM

## 2014-07-17 LAB — POCT HEMOGLOBIN-HEMACUE: HEMOGLOBIN: 7.9 g/dL — AB (ref 13.0–17.0)

## 2014-07-17 MED ORDER — DARBEPOETIN ALFA 100 MCG/0.5ML IJ SOSY
100.0000 ug | PREFILLED_SYRINGE | INTRAMUSCULAR | Status: DC
  Administered 2014-07-17: 100 ug via SUBCUTANEOUS

## 2014-07-20 ENCOUNTER — Telehealth: Payer: Self-pay | Admitting: Internal Medicine

## 2014-07-20 NOTE — Telephone Encounter (Signed)
Loma Sousa calling back [phone]

## 2014-07-20 NOTE — Telephone Encounter (Signed)
lmtcb for pt.  

## 2014-07-20 NOTE — Telephone Encounter (Signed)
Called, spoke with Claiborne Billings.  She will ask Loma Sousa to call office back.

## 2014-07-20 NOTE — Telephone Encounter (Signed)
I spoke with [name] [name] and she states the pt is set to have a colonoscopy with conscious sedation and they want to know if this if ok or should this be done at the hospital? They also sent a fax on this and it is in your look-at folder. Please advise. [place] Bing, CMA

## 2014-07-21 NOTE — Telephone Encounter (Signed)
Spoke with Freda Munro at [place] and advised of Dr Golden Pop recommendations.

## 2014-07-21 NOTE — Telephone Encounter (Signed)
Even t hough he has tracheostomy, safer due to his medical problems this is done in hospital outpatient procedure suite with anesthesia support   Dr. Brand Males, M.D., [facility].C.P Pulmonary and Critical Care Medicine Staff Physician [facility] Pager: [phone], If no answer or between  15:00h - 7:00h: call [phone]  07/21/2014 4:56 AM

## 2014-07-30 ENCOUNTER — Other Ambulatory Visit: Payer: Self-pay | Admitting: Nurse Practitioner

## 2014-07-31 ENCOUNTER — Encounter (HOSPITAL_COMMUNITY)
Admission: RE | Admit: 2014-07-31 | Discharge: 2014-07-31 | Disposition: A | Discharge status: Home / Self Care | Payer: Medicare Other | Source: Ambulatory Visit | Attending: Nephrology | Admitting: Nephrology

## 2014-07-31 DIAGNOSIS — D631 Anemia in chronic kidney disease: Secondary | ICD-10-CM | POA: Diagnosis not present

## 2014-07-31 LAB — RENAL FUNCTION PANEL
ALBUMIN: 3.6 g/dL (ref 3.5–5.2)
Anion gap: 6 (ref 5–15)
BUN: 54 mg/dL — ABNORMAL HIGH (ref 6–23)
CALCIUM: 10.7 mg/dL — AB (ref 8.4–10.5)
CO2: 45 meq/L — AB (ref 19–32)
Chloride: 93 mEq/L — ABNORMAL LOW (ref 96–112)
Creatinine, Ser: 1.73 mg/dL — ABNORMAL HIGH (ref 0.50–1.35)
GFR, EST AFRICAN AMERICAN: 47 mL/min — AB (ref >90)
GFR, EST NON AFRICAN AMERICAN: 41 mL/min — AB (ref >90)
Glucose, Bld: 113 mg/dL — ABNORMAL HIGH (ref 70–99)
Phosphorus: 2.6 mg/dL (ref 2.3–4.6)
Potassium: 4.2 mEq/L (ref 3.7–5.3)
SODIUM: 144 meq/L (ref 137–147)

## 2014-07-31 LAB — POCT HEMOGLOBIN-HEMACUE: Hemoglobin: 8.5 g/dL — ABNORMAL LOW (ref 13.0–17.0)

## 2014-07-31 MED ORDER — DARBEPOETIN ALFA 100 MCG/0.5ML IJ SOSY
PREFILLED_SYRINGE | INTRAMUSCULAR | Status: AC
  Administered 2014-07-31: 100 ug via SUBCUTANEOUS
  Filled 2014-07-31: qty 0.5

## 2014-07-31 MED ORDER — DARBEPOETIN ALFA 100 MCG/0.5ML IJ SOSY
100.0000 ug | PREFILLED_SYRINGE | INTRAMUSCULAR | Status: DC
  Administered 2014-07-31: 100 ug via SUBCUTANEOUS

## 2014-08-10 IMAGING — CR DG ABD PORTABLE 1V
2 series · 2 of 2 positions shown · non-contrast
Comparison: None.

CLINICAL DATA: Respiratory failure.

EXAM:
PORTABLE ABDOMEN - 1 VIEW

[AP (1 of 2)]
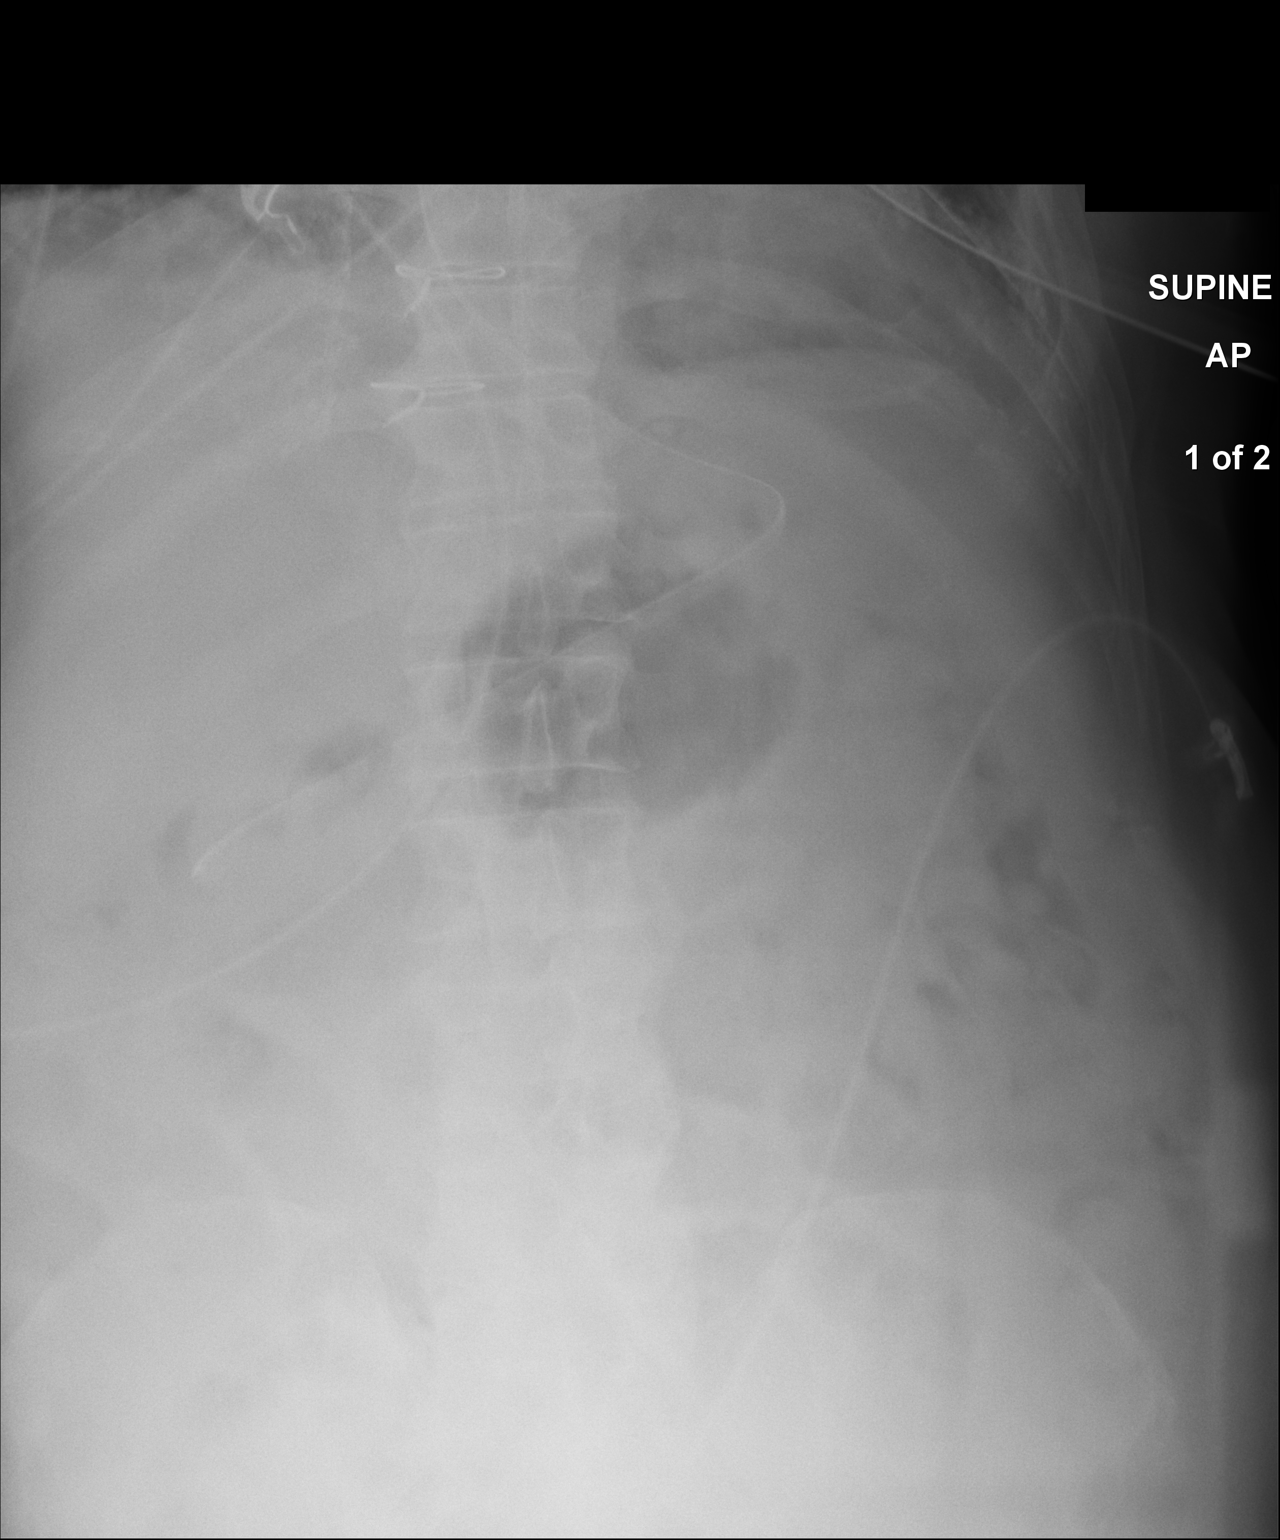

[AP (2 of 2)]
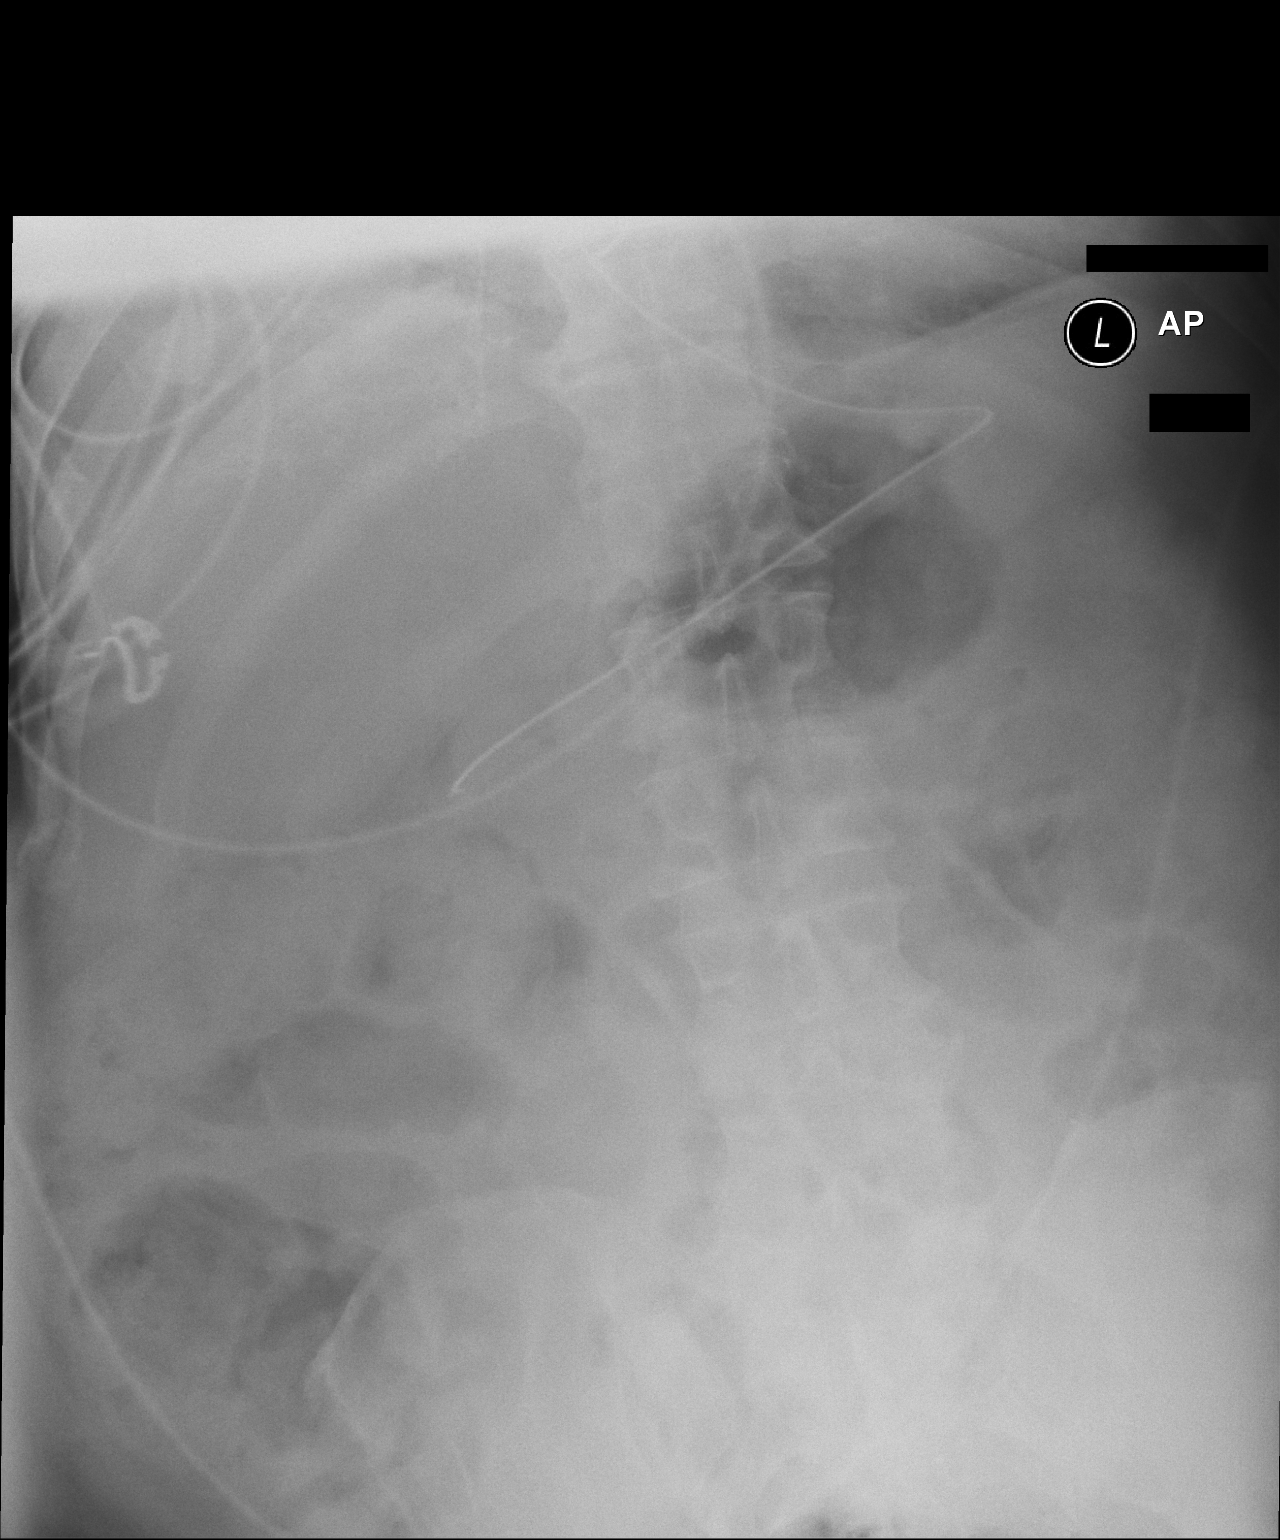

[2 of 2 positions shown; findings below may reference images not displayed]

FINDINGS: A nasogastric tube extends into the distal stomach. Visualized bowel
gas shows no evidence of overt obstruction or significant ileus. No
abnormal calcifications are identified. Soft tissue evaluation is
limited due to body habitus.
IMPRESSION: No evidence of bowel obstruction or significant ileus.

## 2014-08-13 ENCOUNTER — Ambulatory Visit: Payer: Medicare Other | Admitting: Internal Medicine

## 2014-08-17 ENCOUNTER — Encounter (HOSPITAL_COMMUNITY)
Admission: RE | Admit: 2014-08-17 | Discharge: 2014-08-17 | Disposition: A | Discharge status: Home / Self Care | Payer: Medicare Other | Source: Ambulatory Visit | Attending: Nephrology | Admitting: Nephrology

## 2014-08-17 DIAGNOSIS — N183 Chronic kidney disease, stage 3 (moderate): Secondary | ICD-10-CM | POA: Insufficient documentation

## 2014-08-17 DIAGNOSIS — D631 Anemia in chronic kidney disease: Secondary | ICD-10-CM | POA: Insufficient documentation

## 2014-08-17 LAB — IRON AND TIBC
Iron: 33 ug/dL — ABNORMAL LOW (ref 42–165)
Saturation Ratios: 13 % — ABNORMAL LOW (ref 20–55)
TIBC: 258 ug/dL (ref 215–435)
UIBC: 225 ug/dL (ref 125–400)

## 2014-08-17 LAB — POCT HEMOGLOBIN-HEMACUE: Hemoglobin: 8.4 g/dL — ABNORMAL LOW (ref 13.0–17.0)

## 2014-08-17 LAB — FERRITIN: Ferritin: 503 ng/mL — ABNORMAL HIGH (ref 22–322)

## 2014-08-17 MED ORDER — DARBEPOETIN ALFA 100 MCG/0.5ML IJ SOSY
100.0000 ug | PREFILLED_SYRINGE | INTRAMUSCULAR | Status: DC
  Administered 2014-08-17: 100 ug via SUBCUTANEOUS

## 2014-08-17 MED ORDER — DARBEPOETIN ALFA 100 MCG/0.5ML IJ SOSY
PREFILLED_SYRINGE | INTRAMUSCULAR | Status: AC
  Filled 2014-08-17: qty 0.5

## 2014-08-24 ENCOUNTER — Encounter (HOSPITAL_COMMUNITY): Payer: Self-pay | Admitting: *Deleted

## 2014-08-24 DIAGNOSIS — Z930 Tracheostomy status: Secondary | ICD-10-CM | POA: Diagnosis not present

## 2014-08-25 DIAGNOSIS — J9612 Chronic respiratory failure with hypercapnia: Secondary | ICD-10-CM | POA: Diagnosis not present

## 2014-08-25 DIAGNOSIS — Z930 Tracheostomy status: Secondary | ICD-10-CM | POA: Diagnosis not present

## 2014-08-26 ENCOUNTER — Other Ambulatory Visit: Payer: Self-pay | Admitting: Cardiology

## 2014-08-26 NOTE — Telephone Encounter (Signed)
Rx(s) sent to pharmacy electronically.  

## 2014-08-31 ENCOUNTER — Encounter (HOSPITAL_COMMUNITY): Payer: Self-pay

## 2014-09-05 DIAGNOSIS — R0602 Shortness of breath: Secondary | ICD-10-CM | POA: Diagnosis not present

## 2014-09-05 NOTE — Anesthesia Preprocedure Evaluation (Addendum)
Anesthesia Evaluation  Patient identified by MRN, date of birth, ID band Patient awake    Reviewed: Allergy & Precautions, NPO status , Patient's Chart, lab work & pertinent test results, reviewed documented beta blocker date and time   Airway Mallampati: Trach       Dental  (+) Edentulous Upper, Edentulous Lower   Pulmonary sleep apnea , COPD COPD inhaler, former smoker (quit 2000, 25 pack year),  Tracheostomy breath sounds clear to auscultation        Cardiovascular hypertension, Pt. on medications + angina + CAD, + Peripheral Vascular Disease and +CHF Rhythm:Regular  ECHO 2014 EF 60% without wall mltion abnormality, STRESS 07/2014 read as low risk, CABG 2001, STENTS last 2011   Neuro/Psych Anxiety TIA   GI/Hepatic GERD-  Medicated,BLOOD in Stools   Endo/Other  diabetes, Type 2  Renal/GU Renal InsufficiencyRenal diseaseGFR 41     Musculoskeletal   Abdominal (+)  Abdomen: soft.    Peds  Hematology   Anesthesia Other Findings   Reproductive/Obstetrics                          Anesthesia Physical Anesthesia Plan  ASA: III  Anesthesia Plan: MAC   Post-op Pain Management:    Induction: Intravenous  Airway Management Planned: Nasal Cannula  Additional Equipment:   Intra-op Plan:   Post-operative Plan:   Informed Consent: I have reviewed the patients History and Physical, chart, labs and discussed the procedure including the risks, benefits and alternatives for the proposed anesthesia with the patient or authorized representative who has indicated his/her understanding and acceptance.     Plan Discussed with:   Anesthesia Plan Comments: (Ask about plavix)       Anesthesia Quick Evaluation

## 2014-09-07 ENCOUNTER — Other Ambulatory Visit: Payer: Self-pay | Admitting: Gastroenterology

## 2014-09-07 ENCOUNTER — Ambulatory Visit (HOSPITAL_COMMUNITY): Payer: Medicare Other | Admitting: Anesthesiology

## 2014-09-07 ENCOUNTER — Ambulatory Visit (HOSPITAL_COMMUNITY)
Admission: RE | Admit: 2014-09-07 | Discharge: 2014-09-07 | Disposition: A | Discharge status: Home / Self Care | Payer: Medicare Other | Source: Ambulatory Visit | Attending: Gastroenterology | Admitting: Gastroenterology

## 2014-09-07 ENCOUNTER — Encounter (HOSPITAL_COMMUNITY)
Admission: RE | Disposition: A | Discharge status: Home / Self Care | Payer: Self-pay | Source: Ambulatory Visit | Attending: Gastroenterology

## 2014-09-07 ENCOUNTER — Encounter (HOSPITAL_COMMUNITY): Payer: Self-pay | Admitting: Gastroenterology

## 2014-09-07 DIAGNOSIS — R195 Other fecal abnormalities: Secondary | ICD-10-CM | POA: Insufficient documentation

## 2014-09-07 DIAGNOSIS — K219 Gastro-esophageal reflux disease without esophagitis: Secondary | ICD-10-CM | POA: Diagnosis not present

## 2014-09-07 DIAGNOSIS — Z87891 Personal history of nicotine dependence: Secondary | ICD-10-CM | POA: Insufficient documentation

## 2014-09-07 DIAGNOSIS — Z885 Allergy status to narcotic agent status: Secondary | ICD-10-CM | POA: Diagnosis not present

## 2014-09-07 DIAGNOSIS — K648 Other hemorrhoids: Secondary | ICD-10-CM | POA: Insufficient documentation

## 2014-09-07 DIAGNOSIS — N183 Chronic kidney disease, stage 3 (moderate): Secondary | ICD-10-CM | POA: Insufficient documentation

## 2014-09-07 DIAGNOSIS — F419 Anxiety disorder, unspecified: Secondary | ICD-10-CM | POA: Diagnosis not present

## 2014-09-07 DIAGNOSIS — I2781 Cor pulmonale (chronic): Secondary | ICD-10-CM | POA: Insufficient documentation

## 2014-09-07 DIAGNOSIS — I5032 Chronic diastolic (congestive) heart failure: Secondary | ICD-10-CM | POA: Diagnosis not present

## 2014-09-07 DIAGNOSIS — J449 Chronic obstructive pulmonary disease, unspecified: Secondary | ICD-10-CM | POA: Insufficient documentation

## 2014-09-07 DIAGNOSIS — E119 Type 2 diabetes mellitus without complications: Secondary | ICD-10-CM | POA: Diagnosis not present

## 2014-09-07 DIAGNOSIS — D649 Anemia, unspecified: Secondary | ICD-10-CM | POA: Diagnosis not present

## 2014-09-07 DIAGNOSIS — Z930 Tracheostomy status: Secondary | ICD-10-CM | POA: Diagnosis not present

## 2014-09-07 DIAGNOSIS — I129 Hypertensive chronic kidney disease with stage 1 through stage 4 chronic kidney disease, or unspecified chronic kidney disease: Secondary | ICD-10-CM | POA: Diagnosis not present

## 2014-09-07 DIAGNOSIS — Z888 Allergy status to other drugs, medicaments and biological substances status: Secondary | ICD-10-CM | POA: Insufficient documentation

## 2014-09-07 DIAGNOSIS — G4733 Obstructive sleep apnea (adult) (pediatric): Secondary | ICD-10-CM | POA: Diagnosis not present

## 2014-09-07 DIAGNOSIS — K449 Diaphragmatic hernia without obstruction or gangrene: Secondary | ICD-10-CM | POA: Diagnosis not present

## 2014-09-07 DIAGNOSIS — E785 Hyperlipidemia, unspecified: Secondary | ICD-10-CM | POA: Diagnosis not present

## 2014-09-07 DIAGNOSIS — K921 Melena: Secondary | ICD-10-CM | POA: Diagnosis not present

## 2014-09-07 DIAGNOSIS — I739 Peripheral vascular disease, unspecified: Secondary | ICD-10-CM | POA: Diagnosis not present

## 2014-09-07 DIAGNOSIS — Z8673 Personal history of transient ischemic attack (TIA), and cerebral infarction without residual deficits: Secondary | ICD-10-CM | POA: Insufficient documentation

## 2014-09-07 DIAGNOSIS — I2510 Atherosclerotic heart disease of native coronary artery without angina pectoris: Secondary | ICD-10-CM | POA: Insufficient documentation

## 2014-09-07 HISTORY — PX: ESOPHAGOGASTRODUODENOSCOPY (EGD) WITH PROPOFOL: SHX5813

## 2014-09-07 HISTORY — PX: COLONOSCOPY WITH PROPOFOL: SHX5780

## 2014-09-07 HISTORY — DX: Reserved for inherently not codable concepts without codable children: IMO0001

## 2014-09-07 SURGERY — COLONOSCOPY WITH PROPOFOL
Anesthesia: Monitor Anesthesia Care

## 2014-09-07 MED ORDER — PROPOFOL INFUSION 10 MG/ML OPTIME
INTRAVENOUS | Status: DC | PRN
  Administered 2014-09-07: 50 ug/kg/min via INTRAVENOUS

## 2014-09-07 MED ORDER — GLYCOPYRROLATE 0.2 MG/ML IJ SOLN
INTRAMUSCULAR | Status: DC | PRN
  Administered 2014-09-07: 0.1 mg via INTRAVENOUS
  Administered 2014-09-07: 0.2 mg via INTRAVENOUS
  Administered 2014-09-07 (×2): .05 mg via INTRAVENOUS

## 2014-09-07 MED ORDER — PROPOFOL 10 MG/ML IV BOLUS
INTRAVENOUS | Status: AC
  Filled 2014-09-07: qty 20

## 2014-09-07 MED ORDER — KETAMINE HCL 10 MG/ML IJ SOLN
INTRAMUSCULAR | Status: AC
  Filled 2014-09-07: qty 1

## 2014-09-07 MED ORDER — KETAMINE HCL 10 MG/ML IJ SOLN
INTRAMUSCULAR | Status: DC | PRN
  Administered 2014-09-07: 30 mg via INTRAVENOUS

## 2014-09-07 MED ORDER — LACTATED RINGERS IV SOLN
INTRAVENOUS | Status: DC
  Administered 2014-09-07: 1000 mL via INTRAVENOUS

## 2014-09-07 MED ORDER — EPHEDRINE SULFATE 50 MG/ML IJ SOLN
INTRAMUSCULAR | Status: AC
  Filled 2014-09-07: qty 1

## 2014-09-07 MED ORDER — SODIUM CHLORIDE 0.9 % IV SOLN: INTRAVENOUS | Status: DC

## 2014-09-07 MED ORDER — PROMETHAZINE HCL 25 MG/ML IJ SOLN: 6.2500 mg | INTRAMUSCULAR | Status: DC | PRN

## 2014-09-07 MED ORDER — GLYCOPYRROLATE 0.2 MG/ML IJ SOLN
INTRAMUSCULAR | Status: AC
  Filled 2014-09-07: qty 2

## 2014-09-07 MED ORDER — PHENYLEPHRINE 40 MCG/ML (10ML) SYRINGE FOR IV PUSH (FOR BLOOD PRESSURE SUPPORT)
PREFILLED_SYRINGE | INTRAVENOUS | Status: AC
  Filled 2014-09-07: qty 10

## 2014-09-07 MED ORDER — SODIUM CHLORIDE 0.9 % IV SOLN
INTRAVENOUS | Status: DC | PRN
  Administered 2014-09-07: 10:00:00 via INTRAVENOUS

## 2014-09-07 MED ORDER — MEPERIDINE HCL 100 MG/ML IJ SOLN: 6.2500 mg | INTRAMUSCULAR | Status: DC | PRN

## 2014-09-07 MED ORDER — PROPOFOL 10 MG/ML IV BOLUS
INTRAVENOUS | Status: DC | PRN
  Administered 2014-09-07: 30 mg via INTRAVENOUS
  Administered 2014-09-07: 10 mg via INTRAVENOUS
  Administered 2014-09-07: 30 mg via INTRAVENOUS
  Administered 2014-09-07: 10 mg via INTRAVENOUS

## 2014-09-07 MED ORDER — FENTANYL CITRATE 0.05 MG/ML IJ SOLN: 25.0000 ug | INTRAMUSCULAR | Status: DC | PRN

## 2014-09-07 MED ORDER — BUTAMBEN-TETRACAINE-BENZOCAINE 2-2-14 % EX AERO
INHALATION_SPRAY | CUTANEOUS | Status: DC | PRN
  Administered 2014-09-07: 2 via TOPICAL

## 2014-09-07 SURGICAL SUPPLY — 21 items

## 2014-09-07 NOTE — Discharge Instructions (Signed)
Esophagogastroduodenoscopy °Care After °Refer to this sheet in the next few weeks. These instructions provide you with information on caring for yourself after your procedure. Your caregiver may also give you more specific instructions. Your treatment has been planned according to current medical practices, but problems sometimes occur. Call your caregiver if you have any problems or questions after your procedure.  °HOME CARE INSTRUCTIONS °· Do not eat or drink anything until the numbing medicine (local anesthetic) has worn off and your gag reflex has returned. You will know that the local anesthetic has worn off when you can swallow comfortably. °· Do not drive for 12 hours after the procedure or as directed by your caregiver. °· Only take medicines as directed by your caregiver. °SEEK MEDICAL CARE IF:  °· You cannot stop coughing. °· You are not urinating at all or less than usual. °SEEK IMMEDIATE MEDICAL CARE IF: °· You have difficulty swallowing. °· You cannot eat or drink. °· You have worsening throat or chest pain. °· You have dizziness, lightheadedness, or you faint. °· You have nausea or vomiting. °· You have chills. °· You have a fever. °· You have severe abdominal pain. °· You have black, tarry, or bloody stools. °Document Released: 07/17/2012 Document Reviewed: 07/17/2012 °ExitCare® Patient Information ©2015 [employer]. This information is not intended to replace advice given to you by your health care provider. Make sure you discuss any questions you have with your health care provider. ° ° °Conscious Sedation, Adult, Care After °Refer to this sheet in the next few weeks. These instructions provide you with information on caring for yourself after your procedure. Your health care provider may also give you more specific instructions. Your treatment has been planned according to current medical practices, but problems sometimes occur. Call your health care provider if you have any problems or  questions after your procedure. °WHAT TO EXPECT AFTER THE PROCEDURE  °After your procedure: °· You may feel sleepy, clumsy, and have poor balance for several hours. °· Vomiting may occur if you eat too soon after the procedure. °HOME CARE INSTRUCTIONS °· Do not participate in any activities where you could become injured for at least 24 hours. Do not: °¨ Drive. °¨ Swim. °¨ Ride a bicycle. °¨ Operate heavy machinery. °¨ Cook. °¨ Use power tools. °¨ Climb ladders. °¨ Work from a high place. °· Do not make important decisions or sign legal documents until you are improved. °· If you vomit, drink water, juice, or soup when you can drink without vomiting. Make sure you have little or no nausea before eating solid foods. °· Only take over-the-counter or prescription medicines for pain, discomfort, or fever as directed by your health care provider. °· Make sure you and your family fully understand everything about the medicines given to you, including what side effects may occur. °· You should not drink alcohol, take sleeping pills, or take medicines that cause drowsiness for at least 24 hours. °· If you smoke, do not smoke without supervision. °· If you are feeling better, you may resume normal activities 24 hours after you were sedated. °· Keep all appointments with your health care provider. °SEEK MEDICAL CARE IF: °· Your skin is pale or bluish in color. °· You continue to feel nauseous or vomit. °· Your pain is getting worse and is not helped by medicine. °· You have bleeding or swelling. °· You are still sleepy or feeling clumsy after 24 hours. °SEEK IMMEDIATE MEDICAL CARE IF: °· You develop a   rash.  You have difficulty breathing.  You develop any type of allergic problem.  You have a fever. MAKE SURE YOU:  Understand these instructions.  Will watch your condition.  Will get help right away if you are not doing well or get worse. Document Released: 05/21/2013 Document Reviewed: 05/21/2013 [facility]  Patient Information 2015 [place], [place]. This information is not intended to replace advice given to you by your health care provider. Make sure you discuss any questions you have with your health care provider. Colonoscopy, Care After These instructions give you information on caring for yourself after your procedure. Your doctor may also give you more specific instructions. Call your doctor if you have any problems or questions after your procedure. HOME CARE  Do not drive for 24 hours.  Do not sign important papers or use machinery for 24 hours.  You may shower.  You may go back to your usual activities, but go slower for the first 24 hours.  Take rest breaks often during the first 24 hours.  Walk around or use warm packs on your belly (abdomen) if you have belly cramping or gas.  Drink enough fluids to keep your pee (urine) clear or pale yellow.  Resume your normal diet. Avoid heavy or fried foods.  Avoid drinking alcohol for 24 hours or as told by your doctor.  Only take medicines as told by your doctor. If a tissue sample (biopsy) was taken during the procedure:   Do not take aspirin or blood thinners for 7 days, or as told by your doctor.  Do not drink alcohol for 7 days, or as told by your doctor.  Eat soft foods for the first 24 hours. GET HELP IF: You still have a small amount of blood in your poop (stool) 2-3 days after the procedure. GET HELP RIGHT AWAY IF:  You have more than a small amount of blood in your poop.  You see clumps of tissue (blood clots) in your poop.  Your belly is puffy (swollen).  You feel sick to your stomach (nauseous) or throw up (vomit).  You have a fever.  You have belly pain that gets worse and medicine does not help. MAKE SURE YOU:  Understand these instructions.  Will watch your condition.  Will get help right away if you are not doing well or get worse. Document Released: 09/02/2010 Document Revised: 08/05/2013 Document  Reviewed: 04/07/2013 [facility] Patient Information 2015 [place], [place]. This information is not intended to replace advice given to you by your health care provider. Make sure you discuss any questions you have with your health care provider.

## 2014-09-07 NOTE — Op Note (Signed)
[facility] [place], [address]   ENDOSCOPY PROCEDURE REPORT  PATIENT: [name], [name]  MR#: [id number] BIRTHDATE: 19-Nov-1950 , 89  yrs. old GENDER: male ENDOSCOPIST: Teena Irani, MD REFERRED BY: PROCEDURE DATE:  October 01, 2014 PROCEDURE: ASA CLASS: INDICATIONS:  anemia and heme positive stool MEDICATIONS: propofol TOPICAL ANESTHETIC: Cetacaine spray  DESCRIPTION OF PROCEDURE: After the risks benefits and alternatives of the procedure were thoroughly explained, informed consent was obtained.  The Pentax Gastroscope [id number] endoscope was introduced through the mouth and advanced to the second portion of the duodenum , Without limitations.  The instrument was slowly withdrawn as the mucosa was fully examined.    esophagus: Small hiatal hernia       retroflex view normal Stomach: Normal Duodenum: Normal          The scope was then withdrawn from the patient and the procedure completed.  COMPLICATIONS: There were no immediate complications.  ENDOSCOPIC IMPRESSION: small hiatal hernia otherwise normal study  RECOMMENDATIONS: proceed with colonoscopy  REPEAT EXAM:  eSigned:  Teena Irani, MD 2014-10-01 10:29 AM    CC:  CPT CODES: ICD CODES:  The ICD and CPT codes recommended by this software are interpretations from the data that the clinical staff has captured with the software.  The verification of the translation of this report to the ICD and CPT codes and modifiers is the sole responsibility of the health care institution and practicing physician where this report was generated.  [place]. will not be held responsible for the validity of the ICD and CPT codes included on this report.  AMA assumes no liability for data contained or not contained herein. CPT is a Designer, television/film set of the [employer].

## 2014-09-07 NOTE — Op Note (Signed)
[facility] [place], [address]   COLONOSCOPY PROCEDURE REPORT     EXAM DATE: 09-25-2014  PATIENT NAME:      [name] [name], [name] [name]           MR #: [id number] BIRTHDATE:       1951/01/22      VISIT #:     [id number]  ATTENDING:     Teena Irani, MD     STATUS:     outpatient REFERRING MD: ASA CLASS:  INDICATIONS:  The patient is a 64 yr old male here for a colonoscopy due to anemia, heme positive stool PROCEDURE PERFORMED:     colonoscopy MEDICATIONS:     propofol ESTIMATED BLOOD LOSS:     None  CONSENT: The patient understands the risks and benefits of the procedure and understands that these risks include, but are not limited to: sedation, allergic reaction, infection, perforation and/or bleeding. Alternative means of evaluation and treatment include, among others: physical exam, x-rays, and/or surgical intervention. The patient elects to proceed with this endoscopic procedure.  DESCRIPTION OF PROCEDURE: During intra-op preparation period all mechanical & medical equipment was checked for proper function. Hand hygiene and appropriate measures for infection prevention was taken. After the risks, benefits and alternatives of the procedure were thoroughly explained, Informed consent was verified, confirmed and timeout was successfully executed by the treatment team. A digital exam The Pentax Ped Colon [id number] endoscope was introduced through the anus and advanced to the cecum     . No adverse events experienced. The prep was fair to good     . The instrument was then slowly withdrawn as the colon was fully examined. cecum and ascending transverse descending and sigmoid colon all appeared normal with no masses polyps diverticulosis or other abnormality.      retroflexion revealed moderate internal hemorrhoids were nonbleeding       The scope was then completely withdrawn from the patient and the procedure terminated. WITHDRAWAL  TIME:    ADVERSE EVENTS:      There were no immediate complications.  IMPRESSIONS:  RECOMMENDATIONS:     monitor stools and hemoglobin. RECALL:     repeat colonoscopy in 10 years if health allows   Teena Irani, MD eSigned:  Teena Irani, MD September 25, 2014 10:50 AM   cc:  CPT CODES: ICD CODES:  The ICD and CPT codes recommended by this software are interpretations from the data that the clinical staff has captured with the software.  The verification of the translation of this report to the ICD and CPT codes and modifiers is the sole responsibility of the health care institution and practicing physician where this report was generated.  [place]. will not be held responsible for the validity of the ICD and CPT codes included on this report.  AMA assumes no liability for data contained or not contained herein. CPT is a Designer, television/film set of the [employer].

## 2014-09-07 NOTE — Anesthesia Postprocedure Evaluation (Signed)
  Anesthesia Post-op Note  Patient: AVROHOM MCKELVIN  Procedure(s) Performed: Procedure(s): COLONOSCOPY WITH PROPOFOL (N/A) ESOPHAGOGASTRODUODENOSCOPY (EGD) WITH PROPOFOL (N/A)  Patient Location: PACU  Anesthesia Type:MAC  Level of Consciousness: awake and alert   Airway and Oxygen Therapy: Patient Spontanous Breathing and Patient connected to nasal cannula oxygen  Post-op Pain: none  Post-op Assessment: Post-op Vital signs reviewed, Patient's Cardiovascular Status Stable and Patent Airway  Post-op Vital Signs: Reviewed and stable  Last Vitals:  Filed Vitals:   09/07/14 1054  BP: 139/82  Pulse:   Temp:   Resp: 19    Complications: No apparent anesthesia complications

## 2014-09-07 NOTE — Transfer of Care (Signed)
Immediate Anesthesia Transfer of Care Note  Patient: [name] [name]  Procedure(s) Performed: Procedure(s): COLONOSCOPY WITH PROPOFOL (N/A) ESOPHAGOGASTRODUODENOSCOPY (EGD) WITH PROPOFOL (N/A)  Patient Location:Endo [facility]  Anesthesia Type:MAC  Level of Consciousness: Patient easily awoken, sedated, comfortable, cooperative, following commands, responds to stimulation.   Airway & Oxygen Therapy: Patient spontaneously breathing, ventilating well, oxygen via simple oxygen mask.  Post-op Assessment: Report given to PACU RN, vital signs reviewed and stable, moving all extremities.   Post vital signs: Reviewed and stable.  Complications: No apparent anesthesia complications

## 2014-09-07 NOTE — Addendum Note (Signed)
Addended by: Teena Irani on: 09/07/2014 10:20 AM   Modules accepted: Orders

## 2014-09-07 NOTE — H&P (Signed)
[facility] Consult Note  Referring Provider: No ref. provider found Primary Care Physician:  Philis Fendt, MD Primary Gastroenterologist:  Dr.  Laurel Dimmer Complaint: Anemia and heme positive stool HPI: [name] [name] is an 64 y.o. black male  with chronic airway disease status post trach, coronary artery disease and on Plavix who presents with heme positive stool and anemia. Procedures to be done under propofol sedation  Past Medical History  Diagnosis Date  . Coronary atherosclerosis of unspecified type of vessel, native or graft   . Chronic pulmonary heart disease, unspecified   . Chronic respiratory failure   . Chronic airway obstruction, not elsewhere classified   . Angina   . Anemia   . Anxiety   . Hypertension   . CHF (congestive heart failure)   . GERD (gastroesophageal reflux disease)   . History of gout   . Chronic diastolic CHF (congestive heart failure) 10/28/2007    Hospitalized with diastolic CHF controlled with diuresis and BiPAP 2D April 2012 EF 55-60 with severe LVH and grade 1 diastolic dysfunction    . 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 PVD (peripheral vascular disease) 07/13/2011    S/P Lt SFA PTA Sept 2009, known occluded Rt SFA   . Swelling of limb     Venous Duplex, 09/03/2012 - no evidence of thrombus or thrombophelitis  . S/P CABG (coronary artery bypass graft) x 6 2000 or 2001    Nuc, 09/17/2012 - Significant ST abnormalities that are suggestive of ischemia, low risk nuclear study  . TIA (transient ischemic attack)     2D Echo, 12/05/2010 - EF 55-60%, severe LVH  . PVD (peripheral vascular disease)   . COPD (chronic obstructive pulmonary disease)   . Hyperlipidemia   . Shortness of breath dyspnea     with activity  . Chronic kidney disease     sees dr Louanna Raw every 6 months  . Chronic renal insufficiency, stage III (moderate)   . Unspecified sleep apnea     uses oxygen 4 liter per minute all the  time by trach  . Obstructive sleep apnea 10/28/2007  . OSA (obstructive sleep apnea)     Past Surgical History  Procedure Laterality Date  . Coronary artery bypass graft  07/12/2000    x6,  . Appendectomy    . Bilateral vats ablation    . Hemmorhoids    . Lung sx  2005    Growth on outside of right lung  . Esophagogastroduodenoscopy  08/11/2011    Procedure: ESOPHAGOGASTRODUODENOSCOPY (EGD);  Surgeon: Missy Sabins, MD;  Location: [facility] ENDOSCOPY;  Service: Endoscopy;  Laterality: N/A;  . Colonoscopy  08/11/2011    Procedure: COLONOSCOPY;  Surgeon: Missy Sabins, MD;  Location: [place];  Service: Endoscopy;  Laterality: N/A;  . Cataract extraction    . Ptca  04/16/2008    Left SFA-60% stenosis stented with a 7x6 Nitinolself-expanding stent resulting in reduction of 60% mid segmental L SFA stenosis to 0% residual  . Cardiac catheterization  02/16/2010    RCA-80% stenosis stented with a 3x12 Taxus ION DES resulting in reduction of a total occlusion to 0%  . Cardiac catheterization  03/29/2009    Ostial/Proximal PDA-SVG-75% stenosis stented with a 3.0x23 Xience V DES reduction resulting in 75% stenosis to 0%  . Cardiac catheterization  08/05/2004    Circumflex OM stented with a 2.5x13 Cypher stent resulting in reduction of 60% to 0%  .  Cardiac catheterization  06/28/2000    Moderate left main and severe three-vessel disease, consider CABG  . Abdominal aortogram  12/03/2002    70% stenosis at the origin of the profunda femoris, 75% mid superficial femoral artery stenosis  . Tracheostomy tube placement N/A 05/23/2013    Procedure: TRACHEOSTOMY;  Surgeon: Melida Quitter, MD;  Location: [place];  Service: ENT;  Laterality: N/A;  . Coronary angioplasty with stent placement      x 2    Medications Prior to Admission  Medication Sig Dispense Refill  . albuterol (PROVENTIL) (2.5 MG/3ML) 0.083% nebulizer solution Take 3 mLs (2.5 mg total) by nebulization every 4 (four) hours as needed for wheezing.  360 mL 0  . allopurinol (ZYLOPRIM) 100 MG tablet Take 1 tablet by mouth daily.    Marland Kitchen arformoterol (BROVANA) 15 MCG/2ML NEBU Take 2 mLs (15 mcg total) by nebulization 2 (two) times daily. 120 mL 1  . aspirin 81 MG chewable tablet Chew 1 tablet (81 mg total) by mouth daily.    . budesonide (PULMICORT) 0.5 MG/2ML nebulizer solution Take 0.5 mg by nebulization 2 (two) times daily.    . clopidogrel (PLAVIX) 75 MG tablet Take 1 tablet (75 mg total) by mouth daily. 30 tablet 10  . COLCRYS 0.6 MG tablet Take 0.6 mg by mouth daily.     . furosemide (LASIX) 40 MG tablet Take 1 tablet (40 mg total) by mouth 2 (two) times daily. 90 tablet 1  . acetaminophen (TYLENOL) 160 MG/5ML solution Take 20.3 mLs (650 mg total) by mouth every 4 (four) hours as needed for fever. 120 mL 0  . budesonide (PULMICORT) 0.5 MG/2ML nebulizer solution TAKE 2 MLS (0.5 TOTAL) BY NEBULIZATION TWO TIMES DAILY (Patient not taking: Reported on 08/19/2014) 240 mL 2  . metolazone (ZAROXOLYN) 2.5 MG tablet TAKE ONE TABLET BY MOUTH ONCE DAILY ON  MONDAY,  WEDNESDAY,  AND  FRIDAY  30  MINUTES  BEFORE  LASIX 30 tablet 5  . nitroGLYCERIN (NITROSTAT) 0.4 MG SL tablet Place 1 tablet (0.4 mg total) under the tongue every 5 (five) minutes as needed. For chest pain 25 tablet 6  . traMADol (ULTRAM) 50 MG tablet Take 1 tablet (50 mg total) by mouth every 6 (six) hours as needed for pain. (Patient not taking: Reported on 08/19/2014)  0    Allergies:  Allergies  Allergen Reactions  . Amlodipine Besy-Benazepril Hcl Swelling    Lips swell  . Percocet [Oxycodone-Acetaminophen] Other (See Comments)    hallucinations    Family History  Problem Relation Age of Onset  . Diabetes Sister     Social History:  reports that he quit smoking about 16 years ago. His smoking use included Cigarettes. He has a 25 pack-year smoking history. He has never used smokeless tobacco. He reports that he does not drink alcohol or use illicit drugs.  Review of Systems:  negative except occasional dark stools   Blood pressure 165/75, pulse 91, temperature 98.4 F (36.9 C), temperature source Oral, resp. rate 18, height 6\' 1"  (1.854 m), weight 145.151 kg (320 lb), SpO2 100 %. Head: Normocephalic, without obvious abnormality, atraumatic Neck: no adenopathy, no carotid bruit, no JVD, supple, symmetrical, trachea midline and thyroid not enlarged, symmetric, no tenderness/mass/nodules Resp: Tracheostomy in place. clear to auscultation bilaterally Cardio: regular rate and rhythm, S1, S2 normal, no murmur, click, rub or gallop GI: Abdomen soft distended with normoactive bowel sounds. No hepatosplenomegaly mass or guarding Extremities: extremities normal, atraumatic, no cyanosis or edema  No results found for this or any previous visit (from the past 48 hour(s)). No results found.  Assessment: Anemia and heme positive stool in a patient with multiple comorbidities Plan:  Will proceed with EGD and colonoscopy under propofol sedation. Kaid Seeberger C 09/07/2014, 9:57 AM

## 2014-09-08 ENCOUNTER — Encounter (HOSPITAL_COMMUNITY): Payer: Self-pay | Admitting: Gastroenterology

## 2014-09-08 LAB — GLUCOSE, CAPILLARY: GLUCOSE-CAPILLARY: 125 mg/dL — AB (ref 70–99)

## 2014-09-12 DIAGNOSIS — J9610 Chronic respiratory failure, unspecified whether with hypoxia or hypercapnia: Secondary | ICD-10-CM | POA: Diagnosis not present

## 2014-09-25 DIAGNOSIS — Z930 Tracheostomy status: Secondary | ICD-10-CM | POA: Diagnosis not present

## 2014-09-29 ENCOUNTER — Other Ambulatory Visit: Payer: Self-pay | Admitting: Emergency Medicine

## 2014-09-29 MED ORDER — ALBUTEROL SULFATE (2.5 MG/3ML) 0.083% IN NEBU: 2.5000 mg | INHALATION_SOLUTION | RESPIRATORY_TRACT | Status: DC | PRN

## 2014-10-06 DIAGNOSIS — R0602 Shortness of breath: Secondary | ICD-10-CM | POA: Diagnosis not present

## 2014-10-09 ENCOUNTER — Other Ambulatory Visit: Payer: Self-pay | Admitting: Cardiology

## 2014-10-12 DIAGNOSIS — J9610 Chronic respiratory failure, unspecified whether with hypoxia or hypercapnia: Secondary | ICD-10-CM | POA: Diagnosis not present

## 2014-10-13 DIAGNOSIS — I10 Essential (primary) hypertension: Secondary | ICD-10-CM | POA: Diagnosis not present

## 2014-10-13 DIAGNOSIS — M6240 Contracture of muscle, unspecified site: Secondary | ICD-10-CM | POA: Diagnosis not present

## 2014-10-13 DIAGNOSIS — E784 Other hyperlipidemia: Secondary | ICD-10-CM | POA: Diagnosis not present

## 2014-10-13 DIAGNOSIS — I5032 Chronic diastolic (congestive) heart failure: Secondary | ICD-10-CM | POA: Diagnosis not present

## 2014-10-13 DIAGNOSIS — M1A9XX0 Chronic gout, unspecified, without tophus (tophi): Secondary | ICD-10-CM | POA: Diagnosis not present

## 2014-10-13 DIAGNOSIS — E1165 Type 2 diabetes mellitus with hyperglycemia: Secondary | ICD-10-CM | POA: Diagnosis not present

## 2014-10-23 ENCOUNTER — Telehealth: Payer: Self-pay | Admitting: Cardiovascular Disease

## 2014-10-23 ENCOUNTER — Other Ambulatory Visit: Payer: Self-pay | Admitting: *Deleted

## 2014-10-23 MED ORDER — FUROSEMIDE 40 MG PO TABS: 40.0000 mg | ORAL_TABLET | Freq: Two times a day (BID) | ORAL | Status: DC

## 2014-10-23 MED ORDER — FUROSEMIDE 40 MG PO TABS: 40.0000 mg | ORAL_TABLET | Freq: Every day | ORAL | Status: DC

## 2014-10-23 NOTE — Telephone Encounter (Signed)
Med refilled.

## 2014-10-23 NOTE — Telephone Encounter (Signed)
Pt would like to be contacted once the prescription is called in because he hasn't taken any today

## 2014-10-23 NOTE — Telephone Encounter (Signed)
°  1. Which medications need to be refilled? Furosemide (script needs to read 2 tabs po in AM and 1 tab po in PM)  2. Which pharmacy is medication to be sent to?[facility] on [address]  3. Do they need a 30 day or 90 day supply? 30  4. Would they like a call back once the medication has been sent to the pharmacy? yes

## 2014-10-27 DIAGNOSIS — E662 Morbid (severe) obesity with alveolar hypoventilation: Secondary | ICD-10-CM | POA: Diagnosis not present

## 2014-10-27 DIAGNOSIS — Z930 Tracheostomy status: Secondary | ICD-10-CM | POA: Diagnosis not present

## 2014-10-27 DIAGNOSIS — G4733 Obstructive sleep apnea (adult) (pediatric): Secondary | ICD-10-CM | POA: Diagnosis not present

## 2014-10-27 DIAGNOSIS — J449 Chronic obstructive pulmonary disease, unspecified: Secondary | ICD-10-CM | POA: Diagnosis not present

## 2014-10-29 ENCOUNTER — Encounter: Payer: Self-pay | Admitting: Physician Assistant

## 2014-10-29 ENCOUNTER — Ambulatory Visit (INDEPENDENT_AMBULATORY_CARE_PROVIDER_SITE_OTHER): Payer: Medicare Other | Admitting: Physician Assistant

## 2014-10-29 VITALS — BP 121/65 | HR 103 | Ht 73.0 in | Wt 315.0 lb

## 2014-10-29 DIAGNOSIS — I110 Hypertensive heart disease with heart failure: Secondary | ICD-10-CM | POA: Diagnosis not present

## 2014-10-29 DIAGNOSIS — I2510 Atherosclerotic heart disease of native coronary artery without angina pectoris: Secondary | ICD-10-CM

## 2014-10-29 DIAGNOSIS — J449 Chronic obstructive pulmonary disease, unspecified: Secondary | ICD-10-CM | POA: Diagnosis not present

## 2014-10-29 DIAGNOSIS — I5033 Acute on chronic diastolic (congestive) heart failure: Secondary | ICD-10-CM | POA: Diagnosis not present

## 2014-10-29 DIAGNOSIS — I509 Heart failure, unspecified: Secondary | ICD-10-CM

## 2014-10-29 DIAGNOSIS — D649 Anemia, unspecified: Secondary | ICD-10-CM

## 2014-10-29 DIAGNOSIS — N183 Chronic kidney disease, stage 3 unspecified: Secondary | ICD-10-CM

## 2014-10-29 DIAGNOSIS — I739 Peripheral vascular disease, unspecified: Secondary | ICD-10-CM

## 2014-10-29 DIAGNOSIS — Z930 Tracheostomy status: Secondary | ICD-10-CM

## 2014-10-29 LAB — BASIC METABOLIC PANEL
BUN: 64 mg/dL — AB (ref 6–23)
CO2: 49 mEq/L — ABNORMAL HIGH (ref 19–32)
Calcium: 9.8 mg/dL (ref 8.4–10.5)
Chloride: 87 mEq/L — ABNORMAL LOW (ref 96–112)
Creatinine, Ser: 2.08 mg/dL — ABNORMAL HIGH (ref 0.40–1.50)
GFR: 41.74 mL/min — ABNORMAL LOW (ref >60.00)
Glucose, Bld: 127 mg/dL — ABNORMAL HIGH (ref 70–99)
Potassium: 3.8 mEq/L (ref 3.5–5.1)
Sodium: 140 mEq/L (ref 135–145)

## 2014-10-29 NOTE — Progress Notes (Signed)
Cardiology Office Note   Date:  10/29/2014   ID:  [name] [name], DOB [birth year], MRN [id number]  Patient Care Team: Nolene Ebbs, MD as PCP - General (Internal Medicine) Lorretta Harp, MD as Attending Physician (Cardiology) Donato Heinz, MD (Nephrology) Brand Males, MD as Consulting Physician (Pulmonary Disease)     Chief Complaint  Patient presents with  . Leg Swelling     History of Present Illness: [name] [name] is a 64 y.o. male with a complex hx of CAD s/p CABG and multiple PCI procedures, diastolic HF, cor pulmonale, O2 dependent COPD/chronic respiratory failure s/p tracheostomy in 2014, PAD s/p stent to L SFA 2009 and known occlusion of R SFA with tibioperoneal disease, CKD stage 3, DM2, HTN, OSA, obesity, HL.  Last LHC in 2011 demonstrated an occluded S-PDA which was treated with a Taxus DES.  Last Myoview in 09/2012 was low risk.  Last seen by Dr. Quay Burow 06/2014.    He returns today with his wife with complaints of increasing edema in his LE. He has noted this for the last month.  Weights at home have been stable.  He notes worsening SOB.  He is NYHA 3.  He often sleeps with his ventilator at night.  He has chest discomfort and increased SOB in the AM when he uses it.  He feels better when he only uses the trach collar.  He has occasional chest pain at other times.  This is not like his angina.  He does not have associated nausea or diaphoresis.  He feels like it is MSK and it is not getting worse.  He can have it with activity and at rest.  Of note, he has missed several Aranesp injections recently.  His last Hgb was in the low 7s.  He gets his next injection tomorrow.  He tells me that if his Hgb is low enough, they will give him a transfusion with PRBCs.    Studies/Reports Reviewed Today:  Echo 05/21/13 - Mild LVH. EF 55-60%. Wall motion was normal.  Grade 1 diastolic dysfunction  - Ventricular septum: Septal motion showed abnormal function and  dyssynergy. - Aortic valve: Trileaflet; mildly thickened, moderately calcified leaflets. There was mild stenosis. Trivial regurgitation.  Mean gradient: 56mm Hg (S). Peak gradient: 36mm Hg (S). - Mitral valve: Mildly calcified annulus. - Left atrium: The atrium was moderately dilated.  Myoview 09/17/12 Overall Impression:  Low risk stress nuclear study.   Abnormal ECG response to Dobutamine without Signs/Symptoms of Angina or Dyspnea.   Normal nuclear imaging study with diaphragmatic attenuation.  No ischemia.  LV Ejection Fraction: 62%  Cardiac cath 02/2010 LM: Normal LAD: Proximal occluded LCx: Proximal stent patent, OM proximal stent patent, stent beyond OM patent; RI occluded RCA: Mid 80%; PDA occluded at the ostium LIMA-LAD patent, 80% beyond insertion SVG-RI: Patent SVG-PDA: Occluded LVEF: 60% PCI:  Taxus DES to S-PDA   Past Medical History  Diagnosis Date  . Coronary atherosclerosis of unspecified type of vessel, native or graft   . Chronic pulmonary heart disease, unspecified   . Chronic respiratory failure   . Chronic airway obstruction, not elsewhere classified   . Angina   . Anemia   . Anxiety   . Hypertension   . CHF (congestive heart failure)   . GERD (gastroesophageal reflux disease)   . History of gout   . Chronic diastolic CHF (congestive heart failure) 10/28/2007    Hospitalized with diastolic CHF controlled with diuresis and BiPAP 2D  April 2012 EF 55-60 with severe LVH and grade 1 diastolic dysfunction    . 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 PVD (peripheral vascular disease) 07/13/2011    S/P Lt SFA PTA Sept 2009, known occluded Rt SFA   . Swelling of limb     Venous Duplex, 09/03/2012 - no evidence of thrombus or thrombophelitis  . S/P CABG (coronary artery bypass graft) x 6 2000 or 2001    Nuc, 09/17/2012 - Significant ST abnormalities that are suggestive of ischemia, low risk nuclear study  . TIA (transient ischemic attack)      2D Echo, 12/05/2010 - EF 55-60%, severe LVH  . PVD (peripheral vascular disease)   . COPD (chronic obstructive pulmonary disease)   . Hyperlipidemia   . Shortness of breath dyspnea     with activity  . Chronic kidney disease     sees dr Louanna Raw every 6 months  . Chronic renal insufficiency, stage III (moderate)   . Unspecified sleep apnea     uses oxygen 4 liter per minute all the time by trach  . Obstructive sleep apnea 10/28/2007  . OSA (obstructive sleep apnea)     Past Surgical History  Procedure Laterality Date  . Coronary artery bypass graft  07/12/2000    x6,  . Appendectomy    . Bilateral vats ablation    . Hemmorhoids    . Lung sx  2005    Growth on outside of right lung  . Esophagogastroduodenoscopy  08/11/2011    Procedure: ESOPHAGOGASTRODUODENOSCOPY (EGD);  Surgeon: Missy Sabins, MD;  Location: [facility] ENDOSCOPY;  Service: Endoscopy;  Laterality: N/A;  . Colonoscopy  08/11/2011    Procedure: COLONOSCOPY;  Surgeon: Missy Sabins, MD;  Location: [place];  Service: Endoscopy;  Laterality: N/A;  . Cataract extraction    . Ptca  04/16/2008    Left SFA-60% stenosis stented with a 7x6 Nitinolself-expanding stent resulting in reduction of 60% mid segmental L SFA stenosis to 0% residual  . Cardiac catheterization  02/16/2010    RCA-80% stenosis stented with a 3x12 Taxus ION DES resulting in reduction of a total occlusion to 0%  . Cardiac catheterization  03/29/2009    Ostial/Proximal PDA-SVG-75% stenosis stented with a 3.0x23 Xience V DES reduction resulting in 75% stenosis to 0%  . Cardiac catheterization  08/05/2004    Circumflex OM stented with a 2.5x13 Cypher stent resulting in reduction of 60% to 0%  . Cardiac catheterization  06/28/2000    Moderate left main and severe three-vessel disease, consider CABG  . Abdominal aortogram  12/03/2002    70% stenosis at the origin of the profunda femoris, 75% mid superficial femoral artery stenosis  . Tracheostomy tube placement  N/A 05/23/2013    Procedure: TRACHEOSTOMY;  Surgeon: Melida Quitter, MD;  Location: [place];  Service: ENT;  Laterality: N/A;  . Coronary angioplasty with stent placement      x 2  . Colonoscopy with propofol N/A 09/07/2014    Procedure: COLONOSCOPY WITH PROPOFOL;  Surgeon: Missy Sabins, MD;  Location: [facility] ENDOSCOPY;  Service: Endoscopy;  Laterality: N/A;  . Esophagogastroduodenoscopy (egd) with propofol N/A 09/07/2014    Procedure: ESOPHAGOGASTRODUODENOSCOPY (EGD) WITH PROPOFOL;  Surgeon: Missy Sabins, MD;  Location: [facility] ENDOSCOPY;  Service: Endoscopy;  Laterality: N/A;     Current Outpatient Prescriptions  Medication Sig Dispense Refill  . acetaminophen (TYLENOL) 160 MG/5ML solution Take 20.3 mLs (650 mg total) by  mouth every 4 (four) hours as needed for fever. 120 mL 0  . albuterol (PROVENTIL) (2.5 MG/3ML) 0.083% nebulizer solution Take 3 mLs (2.5 mg total) by nebulization every 4 (four) hours as needed for wheezing. 360 mL 5  . allopurinol (ZYLOPRIM) 100 MG tablet Take 1 tablet by mouth daily.    Marland Kitchen arformoterol (BROVANA) 15 MCG/2ML NEBU Take 2 mLs (15 mcg total) by nebulization 2 (two) times daily. 120 mL 1  . aspirin 81 MG chewable tablet Chew 1 tablet (81 mg total) by mouth daily.    . budesonide (PULMICORT) 0.5 MG/2ML nebulizer solution Take 0.5 mg by nebulization 2 (two) times daily.    . clopidogrel (PLAVIX) 75 MG tablet Take 1 tablet (75 mg total) by mouth daily. 30 tablet 10  . COLCRYS 0.6 MG tablet Take 0.6 mg by mouth daily.     . furosemide (LASIX) 40 MG tablet Take 1 tablet (40 mg total) by mouth 2 (two) times daily. 90 tablet 3  . furosemide (LASIX) 40 MG tablet Take 1 tablet (40 mg total) by mouth daily. 30 tablet 6  . metolazone (ZAROXOLYN) 2.5 MG tablet TAKE ONE TABLET BY MOUTH ONCE DAILY ON  MONDAY,  WEDNESDAY,  AND  FRIDAY  30  MINUTES  BEFORE  LASIX 30 tablet 5  . nitroGLYCERIN (NITROSTAT) 0.4 MG SL tablet Place 1 tablet (0.4 mg total) under the tongue every 5 (five) minutes as  needed. For chest pain 25 tablet 6   No current facility-administered medications for this visit.    Allergies:   Amlodipine besy-benazepril hcl and Percocet    Social History:  The patient  reports that he quit smoking about 16 years ago. His smoking use included Cigarettes. He has a 25 pack-year smoking history. He has never used smokeless tobacco. He reports that he does not drink alcohol or use illicit drugs.   Family History:  The patient's family history includes Cancer in his sister; Diabetes in his sister; Hypertension in his sister. There is no history of Heart attack or Stroke.    ROS:   Please see the history of present illness.   Review of Systems  Constitution: Positive for malaise/fatigue. Negative for chills and fever.  Cardiovascular: Positive for leg swelling and orthopnea.  Respiratory: Positive for sleep disturbances due to breathing. Negative for cough and wheezing.   Gastrointestinal: Positive for constipation.  All other systems reviewed and are negative.    PHYSICAL EXAM: VS:  There were no vitals taken for this visit.    Wt Readings from Last 3 Encounters:  10/29/14 315 lb (142.883 kg)  09/07/14 320 lb (145.151 kg)  06/30/14 316 lb 11.2 oz (143.654 kg)     GEN: Well nourished, well developed, in no acute distress HEENT: normal Neck: I cannot assess JVD, Trach in place, no masses Cardiac:  Normal T0/W4, RRR; 2/6 systolic  murmur LSB,  no rubs or gallops, 3+ edema  Respiratory:  Crackles at bases bilaterally, no wheezing  GI: distended  MS: no deformity or atrophy Skin: warm and dry  Neuro:  CNs II-XII intact, Strength and sensation are intact Psych: Normal affect   EKG:  EKG is ordered today.  It demonstrates:   Sinus tachy HR 103, RBBB, no change from prior tracing.   Recent Labs: 06/19/2014: ALT 8; Platelets 185 07/31/2014: BUN 54*; Creatinine 1.73*; Potassium 4.2; Sodium 144 08/17/2014: Hemoglobin 8.4*    Lipid Panel None in last 1 year.      ASSESSMENT AND PLAN:  Acute on chronic diastolic CHF (congestive heart failure) He is volume overloaded.  I suspect this is exacerbated by worsening anemia in the setting of CKD.  He may get a transfusion tomorrow when he gets his Aranesp injection.  He will need IV Lasix after each unit.  For now, I will adjust his diuresis for a few days as his symptoms will likely improve with improved Hgb.      -  Increase Metolazone to 5 mg x 1 dose tomorrow. Then resume 2.5 mg MWF.    -  Increase Lasix to 80 mg in A and 40 mg in P for 3 days starting today.  Then resume usual dose.    -  BMET today.  Repeat in 1 week.  Coronary artery disease involving native coronary artery of native heart without angina pectoris Chest pain atypical for ischemia.  If symptoms do not improve with improved Hgb and volume status, consider arrange nuclear study.  Hypertensive heart disease with heart failure BP at target.  CKD (chronic kidney disease) stage 3, GFR 30-59 ml/min Keep close eye on renal function with diuresis.  Chronic obstructive pulmonary disease, unspecified COPD, unspecified chronic bronchitis type Continue O2.  Tracheostomy status FU with Pulmonary and ENT.  PVD (peripheral vascular disease) He has had some leg pain and was concerned his PAD was worse.  Will try to diurese him some.  If LE edema improves, consider FU ABIs.   Anemia, unspecified anemia type  As noted, he will receive FU Aranesp injection tomorrow and possibly PRBC transfusion if Hgb low enough.    Current medicines are reviewed at length with the patient today.  The patient does not have concerns regarding medicines.  The following changes have been made:  As above.  Labs/ tests ordered today include:   Orders Placed This Encounter  Procedures  . Basic Metabolic Panel (BMET)  . Basic Metabolic Panel (BMET)  . EKG 12-Lead    Disposition:   FU with Dr. Quay Burow or PA at [facility] in 2 weeks (of note, no  appointments available.  I will see in 2 weeks and FU with Dr. Quay Burow next available).   Signed, Versie Starks, MHS 10/29/2014 8:42 AM    [facility] [place], [place], [place]  [address] Phone: [phone]; Fax: [phone]

## 2014-10-29 NOTE — Patient Instructions (Addendum)
TAKE METOLAZONE 5 MG TOMORROW Friday 10/30/14; THEN RESUME REGULAR DOSE  STARTING TODAY 10/29/14 INCREASE LASIX TO 80 MG IN THE MORNING, 40 MG IN THE PM; DO THIS FOR 3 DAYS; AFTER THE 3 DAYS YOU WILL GO BACK ON YOUR REGULAR DOSE OF LASIX 40 MG TWICE DAILY  LAB WORK TODAY; BMET  REPEAT LAB BMET 3/21 OR 3/22  YOU WILL NEED TO FOLLOW UP WITH DR. Gwenlyn Found OR ONE OF HIS PA'S IN THE [place] OFFICE IN 1-2 WEEKS

## 2014-10-30 ENCOUNTER — Telehealth: Payer: Self-pay | Admitting: *Deleted

## 2014-10-30 ENCOUNTER — Encounter (HOSPITAL_COMMUNITY)
Admission: RE | Admit: 2014-10-30 | Discharge: 2014-10-30 | Disposition: A | Discharge status: Home / Self Care | Payer: Medicare Other | Source: Ambulatory Visit | Attending: Nephrology | Admitting: Nephrology

## 2014-10-30 DIAGNOSIS — D631 Anemia in chronic kidney disease: Secondary | ICD-10-CM | POA: Insufficient documentation

## 2014-10-30 DIAGNOSIS — N183 Chronic kidney disease, stage 3 (moderate): Secondary | ICD-10-CM | POA: Insufficient documentation

## 2014-10-30 LAB — RENAL FUNCTION PANEL
Albumin: 3.5 g/dL (ref 3.5–5.2)
Anion gap: 9 (ref 5–15)
BUN: 62 mg/dL — AB (ref 6–23)
CALCIUM: 9.2 mg/dL (ref 8.4–10.5)
CO2: 45 mmol/L — AB (ref 19–32)
Chloride: 87 mmol/L — ABNORMAL LOW (ref 96–112)
Creatinine, Ser: 2.23 mg/dL — ABNORMAL HIGH (ref 0.50–1.35)
GFR calc Af Amer: 35 mL/min — ABNORMAL LOW (ref >90)
GFR calc non Af Amer: 30 mL/min — ABNORMAL LOW (ref >90)
GLUCOSE: 116 mg/dL — AB (ref 70–99)
PHOSPHORUS: 4.2 mg/dL (ref 2.3–4.6)
Potassium: 4.2 mmol/L (ref 3.5–5.1)
Sodium: 141 mmol/L (ref 135–145)

## 2014-10-30 LAB — IRON AND TIBC
IRON: 62 ug/dL (ref 42–165)
SATURATION RATIOS: 22 % (ref 20–55)
TIBC: 278 ug/dL (ref 215–435)
UIBC: 216 ug/dL (ref 125–400)

## 2014-10-30 LAB — FERRITIN: Ferritin: 471 ng/mL — ABNORMAL HIGH (ref 22–322)

## 2014-10-30 LAB — POCT HEMOGLOBIN-HEMACUE: HEMOGLOBIN: 7.4 g/dL — AB (ref 13.0–17.0)

## 2014-10-30 MED ORDER — DARBEPOETIN ALFA 100 MCG/0.5ML IJ SOSY
100.0000 ug | PREFILLED_SYRINGE | INTRAMUSCULAR | Status: DC
  Administered 2014-10-30: 100 ug via SUBCUTANEOUS

## 2014-10-30 MED ORDER — DARBEPOETIN ALFA 100 MCG/0.5ML IJ SOSY
PREFILLED_SYRINGE | INTRAMUSCULAR | Status: AC
  Filled 2014-10-30: qty 0.5

## 2014-10-30 NOTE — Telephone Encounter (Signed)
s/w pt's wife [facility] about lab results with verbal understanding, repeat bmet 3/21.

## 2014-11-02 ENCOUNTER — Telehealth: Payer: Self-pay | Admitting: *Deleted

## 2014-11-02 ENCOUNTER — Other Ambulatory Visit (INDEPENDENT_AMBULATORY_CARE_PROVIDER_SITE_OTHER): Payer: Medicare Other | Admitting: *Deleted

## 2014-11-02 DIAGNOSIS — N183 Chronic kidney disease, stage 3 unspecified: Secondary | ICD-10-CM

## 2014-11-02 DIAGNOSIS — I110 Hypertensive heart disease with heart failure: Secondary | ICD-10-CM

## 2014-11-02 DIAGNOSIS — I5033 Acute on chronic diastolic (congestive) heart failure: Secondary | ICD-10-CM

## 2014-11-02 DIAGNOSIS — I509 Heart failure, unspecified: Secondary | ICD-10-CM

## 2014-11-02 LAB — BASIC METABOLIC PANEL
BUN: 58 mg/dL — AB (ref 6–23)
CALCIUM: 9.1 mg/dL (ref 8.4–10.5)
CHLORIDE: 86 meq/L — AB (ref 96–112)
CO2: 48 meq/L — AB (ref 19–32)
Creatinine, Ser: 2.21 mg/dL — ABNORMAL HIGH (ref 0.40–1.50)
GFR: 38.92 mL/min — ABNORMAL LOW (ref >60.00)
GLUCOSE: 139 mg/dL — AB (ref 70–99)
Potassium: 3.5 mEq/L (ref 3.5–5.1)
Sodium: 138 mEq/L (ref 135–145)

## 2014-11-02 NOTE — Addendum Note (Signed)
Addended by: Eulis Foster on: 11/02/2014 01:36 PM   Modules accepted: Orders

## 2014-11-02 NOTE — Telephone Encounter (Signed)
pt's wife notified about lab results with verbal understanding 

## 2014-11-04 DIAGNOSIS — R0602 Shortness of breath: Secondary | ICD-10-CM | POA: Diagnosis not present

## 2014-11-11 DIAGNOSIS — J9610 Chronic respiratory failure, unspecified whether with hypoxia or hypercapnia: Secondary | ICD-10-CM | POA: Diagnosis not present

## 2014-11-12 DIAGNOSIS — N179 Acute kidney failure, unspecified: Secondary | ICD-10-CM | POA: Diagnosis not present

## 2014-11-12 DIAGNOSIS — I129 Hypertensive chronic kidney disease with stage 1 through stage 4 chronic kidney disease, or unspecified chronic kidney disease: Secondary | ICD-10-CM | POA: Diagnosis not present

## 2014-11-12 DIAGNOSIS — N183 Chronic kidney disease, stage 3 (moderate): Secondary | ICD-10-CM | POA: Diagnosis not present

## 2014-11-12 DIAGNOSIS — D631 Anemia in chronic kidney disease: Secondary | ICD-10-CM | POA: Diagnosis not present

## 2014-11-13 ENCOUNTER — Encounter (HOSPITAL_COMMUNITY)
Admission: RE | Admit: 2014-11-13 | Discharge: 2014-11-13 | Disposition: A | Discharge status: Home / Self Care | Payer: Medicare Other | Source: Ambulatory Visit | Attending: Nephrology | Admitting: Nephrology

## 2014-11-13 ENCOUNTER — Telehealth: Payer: Self-pay | Admitting: Cardiovascular Disease

## 2014-11-13 DIAGNOSIS — D631 Anemia in chronic kidney disease: Secondary | ICD-10-CM | POA: Insufficient documentation

## 2014-11-13 DIAGNOSIS — N183 Chronic kidney disease, stage 3 (moderate): Secondary | ICD-10-CM | POA: Insufficient documentation

## 2014-11-13 LAB — POCT HEMOGLOBIN-HEMACUE: Hemoglobin: 7 g/dL — ABNORMAL LOW (ref 13.0–17.0)

## 2014-11-13 MED ORDER — DARBEPOETIN ALFA 100 MCG/0.5ML IJ SOSY
100.0000 ug | PREFILLED_SYRINGE | INTRAMUSCULAR | Status: DC
  Administered 2014-11-13: 100 ug via SUBCUTANEOUS

## 2014-11-13 MED ORDER — DARBEPOETIN ALFA 100 MCG/0.5ML IJ SOSY
PREFILLED_SYRINGE | INTRAMUSCULAR | Status: AC
  Administered 2014-11-13: 100 ug via SUBCUTANEOUS
  Filled 2014-11-13: qty 0.5

## 2014-11-13 NOTE — Telephone Encounter (Signed)
Received records from [facility] for appointment on 12/04/14 with Dr Gwenlyn Found.  Records given to [facility] (medical records) for Dr Kennon Holter schedule on 12/04/14. lp

## 2014-11-13 NOTE — Progress Notes (Signed)
hemocue today 7.0.  Pt states no symptoms, no bleeding seen.  I called and reported the hemocue result today as well as the one from 2 weeks ago to [place] at [facility].  No changes per Dr Arty Baumgartner

## 2014-11-16 ENCOUNTER — Telehealth: Payer: Self-pay | Admitting: Internal Medicine

## 2014-11-16 NOTE — Telephone Encounter (Signed)
If too fatigued to even speak - needs to go to ER at [place]. Othwerise I could Rx opd with abx/prednisone but too fatigued to speak is very worrisome that needs admission but only in ER or urgent care or urgen office visit they can eval that. Given trach status best he goes to ER

## 2014-11-16 NOTE — Telephone Encounter (Signed)
Spoke with patient's wife as patient was too fatigued to speak. Wife states patient is having increased congestion(light green and yellow in color) which is making it harder for him to breathe. Pt is denying any fever, chills, nausea or vomiting.    Allergies  Allergen Reactions  . Amlodipine Besy-Benazepril Hcl Swelling    Lips swell  . Percocet [Oxycodone-Acetaminophen] Other (See Comments)    hallucinations    MR, Please advise. Thanks.

## 2014-11-16 NOTE — Telephone Encounter (Signed)
Spoke with patient's wife-she is aware that its best for patient to go to ER now given he is too fatigued to speak and his Trach status. Wife verbalized her understanding and will get patient to ER now. Nothing more needed at this time.

## 2014-11-17 ENCOUNTER — Emergency Department (HOSPITAL_COMMUNITY): Payer: Medicare Other

## 2014-11-17 ENCOUNTER — Encounter (HOSPITAL_COMMUNITY): Payer: Self-pay | Admitting: Emergency Medicine

## 2014-11-17 ENCOUNTER — Inpatient Hospital Stay (HOSPITAL_COMMUNITY): Payer: Medicare Other

## 2014-11-17 ENCOUNTER — Inpatient Hospital Stay (HOSPITAL_COMMUNITY)
Admission: EM | Admit: 2014-11-17 | Discharge: 2014-11-19 | DRG: 871 | Disposition: A | Discharge status: Home / Self Care | Payer: Medicare Other | Attending: Internal Medicine | Admitting: Internal Medicine

## 2014-11-17 ENCOUNTER — Other Ambulatory Visit (HOSPITAL_COMMUNITY): Payer: Self-pay

## 2014-11-17 DIAGNOSIS — J9622 Acute and chronic respiratory failure with hypercapnia: Secondary | ICD-10-CM | POA: Diagnosis not present

## 2014-11-17 DIAGNOSIS — N179 Acute kidney failure, unspecified: Secondary | ICD-10-CM | POA: Diagnosis not present

## 2014-11-17 DIAGNOSIS — J449 Chronic obstructive pulmonary disease, unspecified: Secondary | ICD-10-CM | POA: Diagnosis present

## 2014-11-17 DIAGNOSIS — R05 Cough: Secondary | ICD-10-CM | POA: Diagnosis not present

## 2014-11-17 DIAGNOSIS — J941 Fibrothorax: Secondary | ICD-10-CM | POA: Diagnosis not present

## 2014-11-17 DIAGNOSIS — J9612 Chronic respiratory failure with hypercapnia: Secondary | ICD-10-CM | POA: Diagnosis present

## 2014-11-17 DIAGNOSIS — I2510 Atherosclerotic heart disease of native coronary artery without angina pectoris: Secondary | ICD-10-CM | POA: Diagnosis present

## 2014-11-17 DIAGNOSIS — I5032 Chronic diastolic (congestive) heart failure: Secondary | ICD-10-CM | POA: Diagnosis not present

## 2014-11-17 DIAGNOSIS — J189 Pneumonia, unspecified organism: Secondary | ICD-10-CM | POA: Diagnosis not present

## 2014-11-17 DIAGNOSIS — I700 Atherosclerosis of aorta: Secondary | ICD-10-CM | POA: Diagnosis not present

## 2014-11-17 DIAGNOSIS — J9621 Acute and chronic respiratory failure with hypoxia: Secondary | ICD-10-CM | POA: Diagnosis present

## 2014-11-17 DIAGNOSIS — Z87891 Personal history of nicotine dependence: Secondary | ICD-10-CM

## 2014-11-17 DIAGNOSIS — K219 Gastro-esophageal reflux disease without esophagitis: Secondary | ICD-10-CM | POA: Diagnosis present

## 2014-11-17 DIAGNOSIS — Z9911 Dependence on respirator [ventilator] status: Secondary | ICD-10-CM

## 2014-11-17 DIAGNOSIS — Z930 Tracheostomy status: Secondary | ICD-10-CM | POA: Diagnosis not present

## 2014-11-17 DIAGNOSIS — N183 Chronic kidney disease, stage 3 unspecified: Secondary | ICD-10-CM | POA: Diagnosis present

## 2014-11-17 DIAGNOSIS — G4733 Obstructive sleep apnea (adult) (pediatric): Secondary | ICD-10-CM | POA: Diagnosis not present

## 2014-11-17 DIAGNOSIS — I129 Hypertensive chronic kidney disease with stage 1 through stage 4 chronic kidney disease, or unspecified chronic kidney disease: Secondary | ICD-10-CM | POA: Diagnosis present

## 2014-11-17 DIAGNOSIS — Z951 Presence of aortocoronary bypass graft: Secondary | ICD-10-CM

## 2014-11-17 DIAGNOSIS — E119 Type 2 diabetes mellitus without complications: Secondary | ICD-10-CM | POA: Diagnosis not present

## 2014-11-17 DIAGNOSIS — J181 Lobar pneumonia, unspecified organism: Secondary | ICD-10-CM | POA: Diagnosis not present

## 2014-11-17 DIAGNOSIS — I739 Peripheral vascular disease, unspecified: Secondary | ICD-10-CM | POA: Diagnosis present

## 2014-11-17 DIAGNOSIS — J9602 Acute respiratory failure with hypercapnia: Secondary | ICD-10-CM | POA: Diagnosis not present

## 2014-11-17 DIAGNOSIS — R6510 Systemic inflammatory response syndrome (SIRS) of non-infectious origin without acute organ dysfunction: Secondary | ICD-10-CM | POA: Insufficient documentation

## 2014-11-17 DIAGNOSIS — E1159 Type 2 diabetes mellitus with other circulatory complications: Secondary | ICD-10-CM | POA: Diagnosis present

## 2014-11-17 DIAGNOSIS — A419 Sepsis, unspecified organism: Secondary | ICD-10-CM | POA: Diagnosis not present

## 2014-11-17 DIAGNOSIS — J9611 Chronic respiratory failure with hypoxia: Secondary | ICD-10-CM | POA: Diagnosis present

## 2014-11-17 DIAGNOSIS — J209 Acute bronchitis, unspecified: Secondary | ICD-10-CM | POA: Diagnosis present

## 2014-11-17 DIAGNOSIS — D649 Anemia, unspecified: Secondary | ICD-10-CM | POA: Diagnosis not present

## 2014-11-17 DIAGNOSIS — M109 Gout, unspecified: Secondary | ICD-10-CM | POA: Diagnosis not present

## 2014-11-17 DIAGNOSIS — R059 Cough, unspecified: Secondary | ICD-10-CM

## 2014-11-17 DIAGNOSIS — Z6841 Body Mass Index (BMI) 40.0 and over, adult: Secondary | ICD-10-CM | POA: Diagnosis not present

## 2014-11-17 DIAGNOSIS — F17210 Nicotine dependence, cigarettes, uncomplicated: Secondary | ICD-10-CM | POA: Diagnosis not present

## 2014-11-17 DIAGNOSIS — E785 Hyperlipidemia, unspecified: Secondary | ICD-10-CM | POA: Diagnosis present

## 2014-11-17 DIAGNOSIS — J9601 Acute respiratory failure with hypoxia: Secondary | ICD-10-CM | POA: Diagnosis not present

## 2014-11-17 DIAGNOSIS — N189 Chronic kidney disease, unspecified: Secondary | ICD-10-CM

## 2014-11-17 DIAGNOSIS — J9620 Acute and chronic respiratory failure, unspecified whether with hypoxia or hypercapnia: Secondary | ICD-10-CM | POA: Diagnosis not present

## 2014-11-17 DIAGNOSIS — E876 Hypokalemia: Secondary | ICD-10-CM | POA: Diagnosis not present

## 2014-11-17 DIAGNOSIS — R0602 Shortness of breath: Secondary | ICD-10-CM | POA: Diagnosis not present

## 2014-11-17 DIAGNOSIS — I10 Essential (primary) hypertension: Secondary | ICD-10-CM | POA: Diagnosis not present

## 2014-11-17 DIAGNOSIS — R918 Other nonspecific abnormal finding of lung field: Secondary | ICD-10-CM | POA: Diagnosis not present

## 2014-11-17 LAB — INFLUENZA PANEL BY PCR (TYPE A & B)
H1N1FLUPCR: NOT DETECTED
INFLBPCR: NEGATIVE
Influenza A By PCR: NEGATIVE

## 2014-11-17 LAB — CBC WITH DIFFERENTIAL/PLATELET
BASOS PCT: 0 % (ref 0–1)
Basophils Absolute: 0 10*3/uL (ref 0.0–0.1)
Eosinophils Absolute: 0.1 10*3/uL (ref 0.0–0.7)
Eosinophils Relative: 1 % (ref 0–5)
HCT: 28.5 % — ABNORMAL LOW (ref 39.0–52.0)
Hemoglobin: 7.9 g/dL — ABNORMAL LOW (ref 13.0–17.0)
LYMPHS PCT: 9 % — AB (ref 12–46)
Lymphs Abs: 0.9 10*3/uL (ref 0.7–4.0)
MCH: 25.6 pg — ABNORMAL LOW (ref 26.0–34.0)
MCHC: 27.7 g/dL — AB (ref 30.0–36.0)
MCV: 92.2 fL (ref 78.0–100.0)
MONOS PCT: 8 % (ref 3–12)
Monocytes Absolute: 0.8 10*3/uL (ref 0.1–1.0)
NEUTROS PCT: 82 % — AB (ref 43–77)
Neutro Abs: 8.4 10*3/uL — ABNORMAL HIGH (ref 1.7–7.7)
Platelets: 176 10*3/uL (ref 150–400)
RBC: 3.09 MIL/uL — ABNORMAL LOW (ref 4.22–5.81)
RDW: 15.3 % (ref 11.5–15.5)
WBC: 10.2 10*3/uL (ref 4.0–10.5)

## 2014-11-17 LAB — STREP PNEUMONIAE URINARY ANTIGEN: STREP PNEUMO URINARY ANTIGEN: NEGATIVE

## 2014-11-17 LAB — BRAIN NATRIURETIC PEPTIDE: B Natriuretic Peptide: 101.1 pg/mL — ABNORMAL HIGH (ref 0.0–100.0)

## 2014-11-17 LAB — LACTIC ACID, PLASMA
LACTIC ACID, VENOUS: 1.1 mmol/L (ref 0.5–2.0)
Lactic Acid, Venous: 1.3 mmol/L (ref 0.5–2.0)

## 2014-11-17 LAB — BASIC METABOLIC PANEL
ANION GAP: 18 — AB (ref 5–15)
BUN: 60 mg/dL — ABNORMAL HIGH (ref 6–23)
CO2: 38 mmol/L — ABNORMAL HIGH (ref 19–32)
Calcium: 9.4 mg/dL (ref 8.4–10.5)
Chloride: 82 mmol/L — ABNORMAL LOW (ref 96–112)
Creatinine, Ser: 2.45 mg/dL — ABNORMAL HIGH (ref 0.50–1.35)
GFR, EST AFRICAN AMERICAN: 31 mL/min — AB (ref >90)
GFR, EST NON AFRICAN AMERICAN: 27 mL/min — AB (ref >90)
Glucose, Bld: 154 mg/dL — ABNORMAL HIGH (ref 70–99)
Potassium: 3.4 mmol/L — ABNORMAL LOW (ref 3.5–5.1)
Sodium: 138 mmol/L (ref 135–145)

## 2014-11-17 LAB — APTT: APTT: 22 s — AB (ref 24–37)

## 2014-11-17 LAB — CREATININE, URINE, RANDOM: CREATININE, URINE: 45.25 mg/dL

## 2014-11-17 LAB — PROTIME-INR
INR: 0.99 (ref 0.00–1.49)
PROTHROMBIN TIME: 13.2 s (ref 11.6–15.2)

## 2014-11-17 LAB — PROCALCITONIN: Procalcitonin: 1.47 ng/mL

## 2014-11-17 LAB — MRSA PCR SCREENING: MRSA BY PCR: NEGATIVE

## 2014-11-17 MED ORDER — LEVOFLOXACIN IN D5W 750 MG/150ML IV SOLN
750.0000 mg | INTRAVENOUS | Status: DC
  Administered 2014-11-19: 750 mg via INTRAVENOUS
  Filled 2014-11-17: qty 150

## 2014-11-17 MED ORDER — DM-GUAIFENESIN ER 30-600 MG PO TB12
1.0000 | ORAL_TABLET | Freq: Two times a day (BID) | ORAL | Status: DC
  Administered 2014-11-17 – 2014-11-19 (×4): 1 via ORAL
  Filled 2014-11-17 (×9): qty 1

## 2014-11-17 MED ORDER — NITROGLYCERIN 0.4 MG SL SUBL: 0.4000 mg | SUBLINGUAL_TABLET | SUBLINGUAL | Status: DC | PRN

## 2014-11-17 MED ORDER — LEVOFLOXACIN IN D5W 750 MG/150ML IV SOLN: 750.0000 mg | INTRAVENOUS | Status: DC

## 2014-11-17 MED ORDER — POTASSIUM CHLORIDE 20 MEQ/15ML (10%) PO SOLN
40.0000 meq | Freq: Once | ORAL | Status: AC
  Administered 2014-11-17: 40 meq via ORAL
  Filled 2014-11-17: qty 30

## 2014-11-17 MED ORDER — ACETAMINOPHEN 500 MG PO TABS: 500.0000 mg | ORAL_TABLET | Freq: Four times a day (QID) | ORAL | Status: DC | PRN

## 2014-11-17 MED ORDER — COLCHICINE 0.6 MG PO TABS
0.6000 mg | ORAL_TABLET | Freq: Every day | ORAL | Status: DC
  Administered 2014-11-17 – 2014-11-18 (×2): 0.6 mg via ORAL
  Filled 2014-11-17 (×3): qty 1

## 2014-11-17 MED ORDER — SODIUM CHLORIDE 0.9 % IV BOLUS (SEPSIS): 1000.0000 mL | Freq: Once | INTRAVENOUS | Status: DC

## 2014-11-17 MED ORDER — FUROSEMIDE 10 MG/ML IJ SOLN
40.0000 mg | Freq: Once | INTRAMUSCULAR | Status: AC
  Administered 2014-11-17: 40 mg via INTRAVENOUS
  Filled 2014-11-17: qty 4

## 2014-11-17 MED ORDER — LEVOFLOXACIN IN D5W 750 MG/150ML IV SOLN
750.0000 mg | Freq: Once | INTRAVENOUS | Status: AC
  Administered 2014-11-17: 750 mg via INTRAVENOUS
  Filled 2014-11-17: qty 150

## 2014-11-17 MED ORDER — HEPARIN SODIUM (PORCINE) 5000 UNIT/ML IJ SOLN
5000.0000 [IU] | Freq: Three times a day (TID) | INTRAMUSCULAR | Status: DC
  Administered 2014-11-17 – 2014-11-19 (×7): 5000 [IU] via SUBCUTANEOUS
  Filled 2014-11-17 (×9): qty 1

## 2014-11-17 MED ORDER — ASPIRIN EC 81 MG PO TBEC
81.0000 mg | DELAYED_RELEASE_TABLET | Freq: Every day | ORAL | Status: DC
Start: 2014-11-17 — End: 2014-11-19
  Administered 2014-11-17 – 2014-11-19 (×3): 81 mg via ORAL
  Filled 2014-11-17 (×4): qty 1

## 2014-11-17 MED ORDER — ALLOPURINOL 100 MG PO TABS
100.0000 mg | ORAL_TABLET | Freq: Every day | ORAL | Status: DC
  Administered 2014-11-18 – 2014-11-19 (×2): 100 mg via ORAL
  Filled 2014-11-17 (×2): qty 1

## 2014-11-17 MED ORDER — ARFORMOTEROL TARTRATE 15 MCG/2ML IN NEBU
15.0000 ug | INHALATION_SOLUTION | Freq: Two times a day (BID) | RESPIRATORY_TRACT | Status: DC
  Administered 2014-11-17 – 2014-11-19 (×4): 15 ug via RESPIRATORY_TRACT
  Filled 2014-11-17 (×9): qty 2

## 2014-11-17 MED ORDER — BUDESONIDE 0.5 MG/2ML IN SUSP
0.5000 mg | Freq: Two times a day (BID) | RESPIRATORY_TRACT | Status: DC
  Administered 2014-11-17 – 2014-11-19 (×4): 0.5 mg via RESPIRATORY_TRACT
  Filled 2014-11-17 (×9): qty 2

## 2014-11-17 MED ORDER — ALBUTEROL SULFATE (2.5 MG/3ML) 0.083% IN NEBU
2.5000 mg | INHALATION_SOLUTION | RESPIRATORY_TRACT | Status: DC | PRN
  Filled 2014-11-17: qty 3

## 2014-11-17 MED ORDER — SODIUM CHLORIDE 0.9 % IV BOLUS (SEPSIS)
1000.0000 mL | Freq: Once | INTRAVENOUS | Status: AC
  Administered 2014-11-17: 1000 mL via INTRAVENOUS

## 2014-11-17 MED ORDER — DEXTROSE 5 % IV SOLN
2.0000 g | Freq: Once | INTRAVENOUS | Status: AC
  Administered 2014-11-17: 2 g via INTRAVENOUS
  Filled 2014-11-17: qty 2

## 2014-11-17 MED ORDER — CLOPIDOGREL BISULFATE 75 MG PO TABS
75.0000 mg | ORAL_TABLET | Freq: Every day | ORAL | Status: DC
  Administered 2014-11-17 – 2014-11-19 (×3): 75 mg via ORAL
  Filled 2014-11-17 (×3): qty 1

## 2014-11-17 MED ORDER — VANCOMYCIN HCL 10 G IV SOLR
2000.0000 mg | Freq: Once | INTRAVENOUS | Status: AC
  Administered 2014-11-17: 2000 mg via INTRAVENOUS
  Filled 2014-11-17: qty 2000

## 2014-11-17 NOTE — Progress Notes (Signed)
Upon inspection, pt continues to appear to have somewhat labored breathing. BBS are diminished (no wheezing noted). Have placed pt on humidified oxygen (ATC 35%). Pt is able to maintain a productive cough w/ yellow secretions, with occasional tracheal suctioning. Sputum was collected at 0200 for culture. Will continue to monitor.

## 2014-11-17 NOTE — H&P (Signed)
[facility] History and Physical  [name] [name] YBW:[id number] DOB: 1951/03/31 DOA: 11/17/2014  Referring physician: ED physician PCP: Philis Fendt, MD  Specialists:   Chief Complaint: productive cough and shortness of breath  HPI: [name] [name] is a 64 y.o. male with past medical history of hypertension, hyperlipidemia, GERD, gout, diastolic congestive heart failure, diabetes mellitus, CAD post status of CABG, TIA, COPD, chronic kidney disease, OSA, trach in placement, ventilator dependent nightly, who presents with productive cough and shortness breath.  Patient reports that he has been having cough for more than 1 days. He coughs up yellow colored sputum. He also has shortness of breath, but no chest pain. Patient has subjective fever or chills. Patient denies abdominal pain, diarrhea, constipation, dysuria, urgency, frequency, hematuria, skin rashes, joint pain. No unilateral weakness, numbness or tingling sensations. No vision change or hearing loss.  In ED, patient was found to have fever, tachycardia, worsening renal function. Chest x-ray showed congestive heart failure changes, no obvious infiltration. Patient is admitted to inpatient for further evaluation and treatment.  Review of Systems: As presented in the history of presenting illness, rest negative.  Where does patient live?  At home Can patient participate in ADLs? barely  Allergy:  Allergies  Allergen Reactions  . Amlodipine Besy-Benazepril Hcl Swelling    Lips swell  . Percocet [Oxycodone-Acetaminophen] Other (See Comments)    hallucinations    Past Medical History  Diagnosis Date  . Coronary atherosclerosis of unspecified type of vessel, native or graft   . Chronic pulmonary heart disease, unspecified   . Chronic respiratory failure   . Chronic airway obstruction, not elsewhere classified   . Angina   . Anemia   . Anxiety   . Hypertension   . CHF (congestive heart failure)   . GERD  (gastroesophageal reflux disease)   . History of gout   . Chronic diastolic CHF (congestive heart failure) 10/28/2007    Hospitalized with diastolic CHF controlled with diuresis and BiPAP 2D April 2012 EF 55-60 with severe LVH and grade 1 diastolic dysfunction    . 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 PVD (peripheral vascular disease) 07/13/2011    S/P Lt SFA PTA Sept 2009, known occluded Rt SFA   . Swelling of limb     Venous Duplex, 09/03/2012 - no evidence of thrombus or thrombophelitis  . S/P CABG (coronary artery bypass graft) x 6 2000 or 2001    Nuc, 09/17/2012 - Significant ST abnormalities that are suggestive of ischemia, low risk nuclear study  . TIA (transient ischemic attack)     2D Echo, 12/05/2010 - EF 55-60%, severe LVH  . PVD (peripheral vascular disease)   . COPD (chronic obstructive pulmonary disease)   . Hyperlipidemia   . Shortness of breath dyspnea     with activity  . Chronic kidney disease     sees dr Louanna Raw every 6 months  . Chronic renal insufficiency, stage III (moderate)   . Unspecified sleep apnea     uses oxygen 4 liter per minute all the time by trach  . Obstructive sleep apnea 10/28/2007  . OSA (obstructive sleep apnea)     Past Surgical History  Procedure Laterality Date  . Coronary artery bypass graft  07/12/2000    x6,  . Appendectomy    . Bilateral vats ablation    . Hemmorhoids    . Lung sx  2005    Growth on outside  of right lung  . Esophagogastroduodenoscopy  08/11/2011    Procedure: ESOPHAGOGASTRODUODENOSCOPY (EGD);  Surgeon: Missy Sabins, MD;  Location: [facility] ENDOSCOPY;  Service: Endoscopy;  Laterality: N/A;  . Colonoscopy  08/11/2011    Procedure: COLONOSCOPY;  Surgeon: Missy Sabins, MD;  Location: [place];  Service: Endoscopy;  Laterality: N/A;  . Cataract extraction    . Ptca  04/16/2008    Left SFA-60% stenosis stented with a 7x6 Nitinolself-expanding stent resulting in reduction of 60% mid  segmental L SFA stenosis to 0% residual  . Cardiac catheterization  02/16/2010    RCA-80% stenosis stented with a 3x12 Taxus ION DES resulting in reduction of a total occlusion to 0%  . Cardiac catheterization  03/29/2009    Ostial/Proximal PDA-SVG-75% stenosis stented with a 3.0x23 Xience V DES reduction resulting in 75% stenosis to 0%  . Cardiac catheterization  08/05/2004    Circumflex OM stented with a 2.5x13 Cypher stent resulting in reduction of 60% to 0%  . Cardiac catheterization  06/28/2000    Moderate left main and severe three-vessel disease, consider CABG  . Abdominal aortogram  12/03/2002    70% stenosis at the origin of the profunda femoris, 75% mid superficial femoral artery stenosis  . Tracheostomy tube placement N/A 05/23/2013    Procedure: TRACHEOSTOMY;  Surgeon: Melida Quitter, MD;  Location: [place];  Service: ENT;  Laterality: N/A;  . Coronary angioplasty with stent placement      x 2  . Colonoscopy with propofol N/A 09/07/2014    Procedure: COLONOSCOPY WITH PROPOFOL;  Surgeon: Missy Sabins, MD;  Location: [facility] ENDOSCOPY;  Service: Endoscopy;  Laterality: N/A;  . Esophagogastroduodenoscopy (egd) with propofol N/A 09/07/2014    Procedure: ESOPHAGOGASTRODUODENOSCOPY (EGD) WITH PROPOFOL;  Surgeon: Missy Sabins, MD;  Location: [facility] ENDOSCOPY;  Service: Endoscopy;  Laterality: N/A;    Social History:  reports that he quit smoking about 16 years ago. His smoking use included Cigarettes. He has a 25 pack-year smoking history. He has never used smokeless tobacco. He reports that he does not drink alcohol or use illicit drugs.  Family History:  Family History  Problem Relation Age of Onset  . Diabetes Sister   . Heart attack Neg Hx   . Stroke Neg Hx   . Hypertension Sister   . Cancer Sister      Prior to Admission medications   Medication Sig Start Date End Date Taking? Authorizing Provider  acetaminophen (TYLENOL) 500 MG tablet Take 500 mg by mouth every 6 (six) hours as needed for  mild pain or moderate pain.   Yes Historical Provider, MD  albuterol (PROVENTIL) (2.5 MG/3ML) 0.083% nebulizer solution Take 3 mLs (2.5 mg total) by nebulization every 4 (four) hours as needed for wheezing. 09/29/14 09/29/15 Yes Brand Males, MD  allopurinol (ZYLOPRIM) 100 MG tablet Take 1 tablet by mouth daily. 10/08/13  Yes Historical Provider, MD  arformoterol (BROVANA) 15 MCG/2ML NEBU Take 2 mLs (15 mcg total) by nebulization 2 (two) times daily. 10/15/13  Yes Brand Males, MD  aspirin 81 MG tablet Take 81 mg by mouth daily.   Yes Historical Provider, MD  budesonide (PULMICORT) 0.5 MG/2ML nebulizer solution Take 0.5 mg by nebulization 2 (two) times daily.   Yes Historical Provider, MD  clopidogrel (PLAVIX) 75 MG tablet Take 1 tablet (75 mg total) by mouth daily. 07/14/14  Yes Lorretta Harp, MD  COLCRYS 0.6 MG tablet Take 0.6 mg by mouth daily.  04/28/13  Yes Historical Provider, MD  furosemide (LASIX) 40 MG tablet Take 1 tablet (40 mg total) by mouth 2 (two) times daily. 10/23/14  Yes Lorretta Harp, MD  metolazone (ZAROXOLYN) 2.5 MG tablet TAKE ONE TABLET BY MOUTH ONCE DAILY ON  MONDAY,  WEDNESDAY,  AND  FRIDAY  30  MINUTES  BEFORE  LASIX 08/26/14  Yes Lorretta Harp, MD  nitroGLYCERIN (NITROSTAT) 0.4 MG SL tablet Place 1 tablet (0.4 mg total) under the tongue every 5 (five) minutes as needed. For chest pain 01/28/14  Yes Lorretta Harp, MD  acetaminophen (TYLENOL) 160 MG/5ML solution Take 20.3 mLs (650 mg total) by mouth every 4 (four) hours as needed for fever. Patient not taking: Reported on 11/17/2014 06/12/13   Marijean Heath, NP    Physical Exam: Filed Vitals:   11/17/14 0432 11/17/14 0500 11/17/14 0530 11/17/14 0536  BP: 132/64 124/71 127/63   Pulse: 126 126 128   Temp:      TempSrc:      Resp: 29 30 24    SpO2: 95% 94% 92% 92%   General: Not in acute distress HEENT:       Eyes: PERRL, EOMI, no scleral icterus       ENT: No discharge from the ears and nose, no  pharynx injection, no tonsillar enlargement.        Neck: No JVD, no bruit, no mass felt. Cardiac: S1/S2, RRR, No murmurs, No gallops or rubs Pulm: decreased air movement bilaterally. No rales, wheezing, rhonchi or rubs. Abd: Soft, nondistended, nontender, no rebound pain, no organomegaly, BS present Ext: 1+ pitting leg edema bilaterally. 2+DP/PT pulse bilaterally Musculoskeletal: No joint deformities, erythema, or stiffness, ROM full Skin: No rashes.  Neuro: Alert and oriented X3, cranial nerves II-XII grossly intact, muscle strength 5/5 in all extremeties, sensation to light touch intact.  Psych: Patient is not psychotic, no suicidal or hemocidal ideation.  Labs on Admission:  Basic Metabolic Panel:  Recent Labs Lab 11/17/14 0142  NA 138  K 3.4*  CL 82*  CO2 38*  GLUCOSE 154*  BUN 60*  CREATININE 2.45*  CALCIUM 9.4   Liver Function Tests: No results for input(s): AST, ALT, ALKPHOS, BILITOT, PROT, ALBUMIN in the last 168 hours. No results for input(s): LIPASE, AMYLASE in the last 168 hours. No results for input(s): AMMONIA in the last 168 hours. CBC:  Recent Labs Lab 11/13/14 1250 11/17/14 0142  WBC  --  10.2  NEUTROABS  --  8.4*  HGB 7.0* 7.9*  HCT  --  28.5*  MCV  --  92.2  PLT  --  176   Cardiac Enzymes: No results for input(s): CKTOTAL, CKMB, CKMBINDEX, TROPONINI in the last 168 hours.  BNP (last 3 results) No results for input(s): BNP in the last 8760 hours.  ProBNP (last 3 results) No results for input(s): PROBNP in the last 8760 hours.  CBG: No results for input(s): GLUCAP in the last 168 hours.  Radiological Exams on Admission: Dg Chest 2 View  11/17/2014   CLINICAL DATA:  Shortness of breath and cough since Sunday  EXAM: CHEST  2 VIEW  COMPARISON:  07/17/2014  FINDINGS: There is chronic cardiopericardial enlargement and upper mediastinal widening post CABG. Tracheostomy tube which is well seated.  Cephalized blood flow and interstitial coarsening.  Chronic thick pleural calcification of the left more than right lower chest. No significant effusion. No pneumothorax.  IMPRESSION: 1. Probable mild CHF. 2. Bilateral fibrothorax.   Electronically Signed   By: Neva Seat.D.  On: 11/17/2014 02:39   US Renal  11/17/2014   CLINICAL DATA:  Acute kidney injury, hypertension, diabetes, chronic kidney disease stage 3, creatinine 2.45  EXAM: RENAL/URINARY TRACT ULTRASOUND COMPLETE  COMPARISON:  CT abdomen and pelvis 04/12/2011  FINDINGS: Examination limited by body habitus and inability to lie flat.  Right Kidney:  Length: 12.1 cm. Normal cortical thickness. Increased cortical echogenicity. No mass, hydronephrosis or shadowing calcification.  Left Kidney:  Length: 11.5 cm. Normal cortical thickness. Increased cortical echogenicity. No hydronephrosis or shadowing calcifications. Hypoechoic nodules exophytic at inferior pole,13 x 11 x 12 mm and 17 x 15 x 15 mm, containing low level internal echoes.  Bladder:  Appears normal for degree of bladder distention.  IMPRESSION: Echogenic renal cortices bilaterally consistent with medical renal disease.  Questionable tiny complicated cysts fell lower pole LEFT kidney versus simple cysts with internal artifacts due to body habitus ; recommend either followup MR imaging without contrast to characterize or followup ultrasound in 6 months to assess stability.   Electronically Signed   By: Lavonia Dana M.D.   On: 11/17/2014 07:18    Assessment/Plan Principal Problem:   Cough Active Problems:   OSA (obstructive sleep apnea)   CAD (coronary artery disease)   Chronic diastolic CHF (congestive heart failure)   COPD (chronic obstructive pulmonary disease)   RESPIRATORY FAILURE Noct vent dep    PVD (peripheral vascular disease)   CKD (chronic kidney disease) stage 3, GFR 30-59 ml/min   Type 2 diabetes mellitus with vascular disease   Anemia, past Hx GI bleed 5/12   Acute respiratory failure with hypoxia    Acute-on-chronic respiratory failure   Morbid obesity    Tracheostomy status   Acute on chronic renal failure   Cough: patient has productive cough with fever and tachycardia. Patient is septic on admission. It is likely due to early stage of pneumonia, though chest x-ray did not show infiltration. Patient has history of COPD, but no wheezing or rhonchi on auscultation, less likely to have COPD exacerbation. -will admit to tele bed -will treat as CAP with IV levaquin (patient received 1 dose of vancomycin and cefepime IV emergency room) -will get Procalcitonin and trend lactic acid level -IVF: received 1L of NS bolus in ED, followed by 100 cc/h (Patient's diastolic congestive heart failure limited IV fluids). - Urine legionella and S. pneumococcal antigen - Albuterol Nebulizer prn for SOB and Mucinex for cough - Follow up blood culture x2, sputum culture and respiratory virus panel, plus Flu pcr -follow up CT-chest which was ordered by ED  AoCKD-III: Baseline creatinine 1.7-2.2. Patient's creatinine is 2.45, BUN 60. It is likely due to sepsis. -will check Feurea and US-renal -IVF as above -hold diuretics  Diastolic congestive heart failure: 2-D echo on 05/21/13 showed EF 55-60% with grade 1 diastolic dysfunction. Patient is on Lasix 40 mg bid at home. patient has 1+ pitting leg edema bilaterally, indicating mild fluid retention. Because of sepsis, patient needed IV fluid. -Will hold diuretics, including Lasix and metolazone. -Check BNP.  COPD: not like acutely exacerbated. -Continue home breathing treatment: Albuterol, Brovana, poulmicort  Trach and ventilator dependent: -consul to RT  CAD: s/p of CABG. No chest pain. -continue aspirin, Plavix, when necessary nitroglycerin -get EKG   DVT ppx: SQ Heparin     Code Status: Full code Family Communication:  Yes, patient's   wife    at bed side Disposition Plan: Admit to inpatient   Date of Service 11/17/2014    Ivor Costa [facility] Pager [phone]  If 7PM-7AM, please contact night-coverage [email or web address] Password [email or web address] 11/17/2014, 7:30 AM

## 2014-11-17 NOTE — Progress Notes (Signed)
ANTIBIOTIC CONSULT NOTE - INITIAL  Pharmacy Consult for Levaquin Indication: CAP  Allergies  Allergen Reactions  . Amlodipine Besy-Benazepril Hcl Swelling    Lips swell  . Percocet [Oxycodone-Acetaminophen] Other (See Comments)    hallucinations    Patient Measurements:    Vital Signs: Temp: 100 F (37.8 C) (04/05 0248) Temp Source: Rectal (04/05 0248) BP: 120/77 mmHg (04/05 1200) Pulse Rate: 109 (04/05 1200) Intake/Output from previous day: 04/04 0701 - 04/05 0700 In: 50 [I.V.:50] Out: 625 [Urine:625] Intake/Output from this shift: Total I/O In: 100 [I.V.:100] Out: 750 [Urine:750]  Labs:  Recent Labs  11/17/14 0142 11/17/14 0545  WBC 10.2  --   HGB 7.9*  --   PLT 176  --   LABCREA  --  45.25  CREATININE 2.45*  --    CrCl cannot be calculated (Unknown ideal weight.). No results for input(s): VANCOTROUGH, VANCOPEAK, VANCORANDOM, GENTTROUGH, GENTPEAK, GENTRANDOM, TOBRATROUGH, TOBRAPEAK, TOBRARND, AMIKACINPEAK, AMIKACINTROU, AMIKACIN in the last 72 hours.   Microbiology: No results found for this or any previous visit (from the past 720 hour(s)).  Medical History: Past Medical History  Diagnosis Date  . Coronary atherosclerosis of unspecified type of vessel, native or graft   . Chronic pulmonary heart disease, unspecified   . Chronic respiratory failure   . Chronic airway obstruction, not elsewhere classified   . Angina   . Anemia   . Anxiety   . Hypertension   . CHF (congestive heart failure)   . GERD (gastroesophageal reflux disease)   . History of gout   . Chronic diastolic CHF (congestive heart failure) 10/28/2007    Hospitalized with diastolic CHF controlled with diuresis and BiPAP 2D April 2012 EF 55-60 with severe LVH and grade 1 diastolic dysfunction    . 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 PVD (peripheral vascular disease) 07/13/2011    S/P Lt SFA PTA Sept 2009, known occluded Rt SFA   . Swelling of limb     Venous Duplex, 09/03/2012 - no evidence of thrombus or thrombophelitis  . S/P CABG (coronary artery bypass graft) x 6 2000 or 2001    Nuc, 09/17/2012 - Significant ST abnormalities that are suggestive of ischemia, low risk nuclear study  . TIA (transient ischemic attack)     2D Echo, 12/05/2010 - EF 55-60%, severe LVH  . PVD (peripheral vascular disease)   . COPD (chronic obstructive pulmonary disease)   . Hyperlipidemia   . Shortness of breath dyspnea     with activity  . Chronic kidney disease     sees dr Louanna Raw every 6 months  . Chronic renal insufficiency, stage III (moderate)   . Unspecified sleep apnea     uses oxygen 4 liter per minute all the time by trach  . Obstructive sleep apnea 10/28/2007  . OSA (obstructive sleep apnea)     Medications:  See electronic med rec  Assessment: 64 y.o. M admitted with SIRS likely secondary to acute bronchitis vs early PNA. Pt is chronic trach patient. To begin empiric Levaquin. Cultures pending. WBC wnl. PCT 1.47, LA wnl. Pt with acute on CKD (baseline SCr 1.7-2.2), SCr elevated to 2.45, est CrCl 27 ml/min. Noted pt received Cefepime 2gm, Levaquin 750mg , and Vancomycin 2gm ~0330 this a.m.  Goal of Therapy:  Resolution of infection  Plan:  Levaquin 750mg  IV q48h Will f/u renal function, micro data, and pt's clinical condition  Sherlon Handing, PharmD, BCPS Clinical pharmacist, pager [phone]  11/17/2014,1:27 PM

## 2014-11-17 NOTE — ED Notes (Signed)
Phlebotomy at bedside.

## 2014-11-17 NOTE — Progress Notes (Signed)
RT Note: Patient states that he wears a home ventilator at home at night. He says he is not compliant with it but he's suppose to go on it every night. I notified his RN [name] who spoke to the patients doctor regarding nocturnal ventilator. He is planning to transfer patient so that he can be placed on the vent at night. RT is awaiting further orders. Rt will continue to monitor.

## 2014-11-17 NOTE — ED Provider Notes (Addendum)
CSN: [id number]     Arrival date & time 11/17/14  0107 History   None    This chart was scribed for [name] Balls, MD by Forrestine Him, ED Scribe. This patient was seen in room A12C/A12C and the patient's care was started 1:42 AM.   Chief Complaint  Patient presents with  . Cough    The patient said he started havinbg a sore throat and he has been coughing.  His wife says she suctions him and the discharge is green.  He denies any fever but has been aching.  . Sore Throat   The history is provided by the patient and the spouse.    HPI Comments: [name] [name] is a 64 y.o. male with a PMHx of coronary atherosclerosis, chronic pulmonary heart disease, angina, HTN, CHF, GERD, DM, PVD, TIA, COPD, hyperlipidemia, and renal insufficiency who presents to the Emergency Department complaining of constant, ongoing generalized weakness x 1 day. He also reports sore throat and cough. Wife suctions pt at home with yellow/green and whit creamy mucous. No recent fever or chills. Pt has a history of pneumonia and was last diagnosed in late 2015.  He has sick contacts and his wife who have similar symptoms at home.  Pt with known allergies to Percocet and Amlodipine Besy-Bezazepril HCL.  Past Medical History  Diagnosis Date  . Coronary atherosclerosis of unspecified type of vessel, native or graft   . Chronic pulmonary heart disease, unspecified   . Chronic respiratory failure   . Chronic airway obstruction, not elsewhere classified   . Angina   . Anemia   . Anxiety   . Hypertension   . CHF (congestive heart failure)   . GERD (gastroesophageal reflux disease)   . History of gout   . Chronic diastolic CHF (congestive heart failure) 10/28/2007    Hospitalized with diastolic CHF controlled with diuresis and BiPAP 2D April 2012 EF 55-60 with severe LVH and grade 1 diastolic dysfunction    . 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 PVD (peripheral vascular disease) 07/13/2011     S/P Lt SFA PTA Sept 2009, known occluded Rt SFA   . Swelling of limb     Venous Duplex, 09/03/2012 - no evidence of thrombus or thrombophelitis  . S/P CABG (coronary artery bypass graft) x 6 2000 or 2001    Nuc, 09/17/2012 - Significant ST abnormalities that are suggestive of ischemia, low risk nuclear study  . TIA (transient ischemic attack)     2D Echo, 12/05/2010 - EF 55-60%, severe LVH  . PVD (peripheral vascular disease)   . COPD (chronic obstructive pulmonary disease)   . Hyperlipidemia   . Shortness of breath dyspnea     with activity  . Chronic kidney disease     sees dr Louanna Raw every 6 months  . Chronic renal insufficiency, stage III (moderate)   . Unspecified sleep apnea     uses oxygen 4 liter per minute all the time by trach  . Obstructive sleep apnea 10/28/2007  . OSA (obstructive sleep apnea)    Past Surgical History  Procedure Laterality Date  . Coronary artery bypass graft  07/12/2000    x6,  . Appendectomy    . Bilateral vats ablation    . Hemmorhoids    . Lung sx  2005    Growth on outside of right lung  . Esophagogastroduodenoscopy  08/11/2011    Procedure: ESOPHAGOGASTRODUODENOSCOPY (EGD);  Surgeon: Jenny Reichmann  Charolette Forward, MD;  Location: [facility] ENDOSCOPY;  Service: Endoscopy;  Laterality: N/A;  . Colonoscopy  08/11/2011    Procedure: COLONOSCOPY;  Surgeon: Missy Sabins, MD;  Location: [place];  Service: Endoscopy;  Laterality: N/A;  . Cataract extraction    . Ptca  04/16/2008    Left SFA-60% stenosis stented with a 7x6 Nitinolself-expanding stent resulting in reduction of 60% mid segmental L SFA stenosis to 0% residual  . Cardiac catheterization  02/16/2010    RCA-80% stenosis stented with a 3x12 Taxus ION DES resulting in reduction of a total occlusion to 0%  . Cardiac catheterization  03/29/2009    Ostial/Proximal PDA-SVG-75% stenosis stented with a 3.0x23 Xience V DES reduction resulting in 75% stenosis to 0%  . Cardiac catheterization  08/05/2004    Circumflex OM  stented with a 2.5x13 Cypher stent resulting in reduction of 60% to 0%  . Cardiac catheterization  06/28/2000    Moderate left main and severe three-vessel disease, consider CABG  . Abdominal aortogram  12/03/2002    70% stenosis at the origin of the profunda femoris, 75% mid superficial femoral artery stenosis  . Tracheostomy tube placement N/A 05/23/2013    Procedure: TRACHEOSTOMY;  Surgeon: Melida Quitter, MD;  Location: [place];  Service: ENT;  Laterality: N/A;  . Coronary angioplasty with stent placement      x 2  . Colonoscopy with propofol N/A 09/07/2014    Procedure: COLONOSCOPY WITH PROPOFOL;  Surgeon: Missy Sabins, MD;  Location: [facility] ENDOSCOPY;  Service: Endoscopy;  Laterality: N/A;  . Esophagogastroduodenoscopy (egd) with propofol N/A 09/07/2014    Procedure: ESOPHAGOGASTRODUODENOSCOPY (EGD) WITH PROPOFOL;  Surgeon: Missy Sabins, MD;  Location: [facility] ENDOSCOPY;  Service: Endoscopy;  Laterality: N/A;   Family History  Problem Relation Age of Onset  . Diabetes Sister   . Heart attack Neg Hx   . Stroke Neg Hx   . Hypertension Sister   . Cancer Sister    History  Substance Use Topics  . Smoking status: Former Smoker -- 1.00 packs/day for 25 years    Types: Cigarettes    Quit date: 08/14/1998  . Smokeless tobacco: Never Used  . Alcohol Use: No    Review of Systems  Constitutional: Positive for fever and fatigue. Negative for chills.  HENT: Positive for sore throat.   Respiratory: Positive for cough and shortness of breath (mild at baseline).   Musculoskeletal: Positive for myalgias.  All other systems reviewed and are negative.     Allergies  Amlodipine besy-benazepril hcl and Percocet  Home Medications   Prior to Admission medications   Medication Sig Start Date End Date Taking? Authorizing Provider  acetaminophen (TYLENOL) 160 MG/5ML solution Take 20.3 mLs (650 mg total) by mouth every 4 (four) hours as needed for fever. 06/12/13   Marijean Heath, NP  albuterol  (PROVENTIL) (2.5 MG/3ML) 0.083% nebulizer solution Take 3 mLs (2.5 mg total) by nebulization every 4 (four) hours as needed for wheezing. 09/29/14 09/29/15  Brand Males, MD  allopurinol (ZYLOPRIM) 100 MG tablet Take 1 tablet by mouth daily. 10/08/13   Historical Provider, MD  arformoterol (BROVANA) 15 MCG/2ML NEBU Take 2 mLs (15 mcg total) by nebulization 2 (two) times daily. 10/15/13   Brand Males, MD  aspirin 81 MG tablet Take 81 mg by mouth daily.    Historical Provider, MD  budesonide (PULMICORT) 0.5 MG/2ML nebulizer solution Take 0.5 mg by nebulization 2 (two) times daily.    Historical Provider, MD  clopidogrel (  PLAVIX) 75 MG tablet Take 1 tablet (75 mg total) by mouth daily. 07/14/14   Lorretta Harp, MD  COLCRYS 0.6 MG tablet Take 0.6 mg by mouth daily.  04/28/13   Historical Provider, MD  furosemide (LASIX) 40 MG tablet Take 1 tablet (40 mg total) by mouth 2 (two) times daily. 10/23/14   Lorretta Harp, MD  metolazone (ZAROXOLYN) 2.5 MG tablet TAKE ONE TABLET BY MOUTH ONCE DAILY ON  MONDAY,  WEDNESDAY,  AND  FRIDAY  30  MINUTES  BEFORE  LASIX 08/26/14   Lorretta Harp, MD  nitroGLYCERIN (NITROSTAT) 0.4 MG SL tablet Place 1 tablet (0.4 mg total) under the tongue every 5 (five) minutes as needed. For chest pain 01/28/14   Lorretta Harp, MD   Triage Vitals: BP 126/66 mmHg  Pulse 125  Temp(Src) 98.8 F (37.1 C) (Oral)  Resp 20  SpO2 98%   Physical Exam  Constitutional: He is oriented to person, place, and time. He appears well-developed and well-nourished.  HENT:  Head: Normocephalic and atraumatic.  Eyes: EOM are normal.  Neck: Normal range of motion.  Trach in place  Cardiovascular: Regular rhythm, normal heart sounds and intact distal pulses.  Tachycardia present.   Pulmonary/Chest: Effort normal and breath sounds normal. No respiratory distress.  Abdominal: Soft. He exhibits no distension. There is no tenderness.  Periumbilical hernia easily reducable  Musculoskeletal:  Normal range of motion.  2 plus pitting edema bilaterally  Neurological: He is alert and oriented to person, place, and time.  Skin: Skin is warm and dry.  Psychiatric: He has a normal mood and affect. Judgment normal.  Nursing note and vitals reviewed.   ED Course  Procedures (including critical care time)  DIAGNOSTIC STUDIES: Oxygen Saturation is 98% on 4L/trach, Normal by my interpretation.    COORDINATION OF CARE: 1:27 AM-Discussed treatment plan with pt at bedside and pt agreed to plan.     Labs Review Labs Reviewed  CBC WITH DIFFERENTIAL/PLATELET - Abnormal; Notable for the following:    RBC 3.09 (*)    Hemoglobin 7.9 (*)    HCT 28.5 (*)    MCH 25.6 (*)    MCHC 27.7 (*)    Neutrophils Relative % 82 (*)    Neutro Abs 8.4 (*)    Lymphocytes Relative 9 (*)    All other components within normal limits  BASIC METABOLIC PANEL - Abnormal; Notable for the following:    Potassium 3.4 (*)    Chloride 82 (*)    CO2 38 (*)    Glucose, Bld 154 (*)    BUN 60 (*)    Creatinine, Ser 2.45 (*)    GFR calc non Af Amer 27 (*)    GFR calc Af Amer 31 (*)    Anion gap 18 (*)    All other components within normal limits  CULTURE, RESPIRATORY (NON-EXPECTORATED)    Imaging Review Dg Chest 2 View  11/17/2014   CLINICAL DATA:  Shortness of breath and cough since Sunday  EXAM: CHEST  2 VIEW  COMPARISON:  07/17/2014  FINDINGS: There is chronic cardiopericardial enlargement and upper mediastinal widening post CABG. Tracheostomy tube which is well seated.  Cephalized blood flow and interstitial coarsening. Chronic thick pleural calcification of the left more than right lower chest. No significant effusion. No pneumothorax.  IMPRESSION: 1. Probable mild CHF. 2. Bilateral fibrothorax.   Electronically Signed   By: Monte Fantasia M.D.   On: 11/17/2014 02:39  EKG Interpretation None    Intepretation in MUSE but not crossing over.  Sinus tachycardia, RBBB, no sig changes from previous  MDM    Final diagnoses:  None     patient presents emergency department for generalized weakness, increased secretions, and new purulent secretions. Rectal temperature here is 37.8, he continues to be tachycardic to the 120s. Patient was given  IV fluids however this was stopped after x-ray was obtained showing mild CHF. Patient was subsequently given Lasix for treatment.  Potassium was replaced emergency department as well. Other laboratory abnormalities are within his normal limits. Will obtain CT scan of the chest to further assess for possible pneumonia.  Patient could not tolerate laying flat.  Given his history I still have concern for pneumonia.  Will draw cultures and give HCAP abx for treatment. He was seen in this hospital for colonoscopy Jan 25, inside the hospital setting which I believe counts as HCAP qualifier. PAtient was admitted to hospitalist for continued management.   I personally performed the services described in this documentation, which was scribed in my presence. The recorded information has been reviewed and is accurate.   [name] Balls, MD 11/17/14 1550  [name] Balls, MD 11/17/14 5427

## 2014-11-17 NOTE — ED Notes (Signed)
The patient said he started havinbg a sore throat and he has been coughing.  His wife says she suctions him and the discharge is green.  He denies any fever but has been aching.  The wife says he has been having SOB off and on for years so that is not new.  His MD is aware.  He is satting 98% on 4L/trach.

## 2014-11-17 NOTE — ED Notes (Signed)
Patient transported to Ultrasound 

## 2014-11-17 NOTE — ED Notes (Signed)
Respiratory at bedside.

## 2014-11-17 NOTE — ED Notes (Addendum)
Respiratory therapist at bedside setting up pt's o2.

## 2014-11-17 NOTE — Progress Notes (Signed)
Transported Pt from A12 to C29 on 6L VM/Trach set up. Placed pt back on trach collar. Pt states he feels his O2 is too high at 8L/35% Requested it be turned down. It is set at 5L/28%. Pt and Family aware if SOB to let RN know. RT will continue to monitor.

## 2014-11-17 NOTE — ED Notes (Signed)
MD (Oni) at bedside. 

## 2014-11-17 NOTE — Evaluation (Signed)
Physical Therapy Evaluation Patient Details Name: [name] [name] MRN: [id number] DOB: May 05, 1951 Today's Date: 11/17/2014   History of Present Illness  HPI: [name] [name] is a 64 y.o. male with past medical history of hypertension, hyperlipidemia, GERD, gout, diastolic congestive heart failure, diabetes mellitus, CAD post status of CABG, TIA, COPD, chronic kidney disease, OSA, trach in placement, ventilator dependent nightly, who presents with productive cough and shortness breath.  Clinical Impression   Pt admitted with above diagnosis. Pt currently with functional limitations due to the deficits listed below (see PT Problem List).  Pt will benefit from skilled PT to increase their independence and safety with mobility to allow discharge to the venue listed below.       Follow Up Recommendations Other (comment) (Pulmonary Rehab)    Equipment Recommendations  None recommended by PT    Recommendations for Other Services       Precautions / Restrictions Precautions Precautions: Fall Precaution Comments: Fall risk greatly reduced with use of assistive device Restrictions Other Position/Activity Restrictions: Walk on Trach collar (28% O2 on eval)      Mobility  Bed Mobility               General bed mobility comments: in chair upon arrival  Transfers Overall transfer level: Needs assistance Equipment used: Rolling walker (2 wheeled) Transfers: Sit to/from Stand Sit to Stand: Min guard         General transfer comment: Cues for hand placement  Ambulation/Gait Ambulation/Gait assistance: Min guard Ambulation Distance (Feet): 100 Feet Assistive device: Rolling walker (2 wheeled) Gait Pattern/deviations: Step-through pattern;Trunk flexed Gait velocity: slowed   General Gait Details: Cues to self-monitor for activity tolerance; noted DOE 2/4  Stairs            Wheelchair Mobility    Modified Rankin (Stroke Patients Only)       Balance                                             Pertinent Vitals/Pain Pain Assessment: No/denies pain    Home Living Family/patient expects to be discharged to:: Private residence Living Arrangements: Spouse/significant other Available Help at Discharge: Family Type of Home: House Home Access: Level entry     Home Layout: One level Home Equipment: Environmental consultant - 2 wheels;Cane - quad      Prior Function Level of Independence: Independent with assistive device(s)         Comments: uses cane; still driving      Hand Dominance        Extremity/Trunk Assessment   Upper Extremity Assessment: Overall WFL for tasks assessed           Lower Extremity Assessment: Generalized weakness (with fatigue)         Communication   Communication: No difficulties;Passy-Muir valve (he occludes his trach with PMV or his finger)  Cognition Arousal/Alertness: Awake/alert Behavior During Therapy: WFL for tasks assessed/performed Overall Cognitive Status: Within Functional Limits for tasks assessed                      General Comments      Exercises        Assessment/Plan    PT Assessment Patient needs continued PT services  PT Diagnosis Generalized weakness;Other (comment) (decr functional capacity)   PT Problem List Decreased strength;Decreased activity tolerance;Decreased mobility;Decreased balance;Cardiopulmonary status  limiting activity  PT Treatment Interventions DME instruction;Gait training;Stair training;Functional mobility training;Therapeutic activities;Therapeutic exercise;Patient/family education   PT Goals (Current goals can be found in the Care Plan section) Acute Rehab PT Goals Patient Stated Goal: not stated PT Goal Formulation: With patient Time For Goal Achievement: 12/01/14 Potential to Achieve Goals: Good    Frequency Min 3X/week   Barriers to discharge        Co-evaluation               End of Session Equipment Utilized During  Treatment: Oxygen Activity Tolerance: Patient tolerated treatment well Patient left: in chair;with call bell/phone within reach Nurse Communication: Mobility status         Time: 7341-9379 PT Time Calculation (min) (ACUTE ONLY): 22 min   Charges:   PT Evaluation $Initial PT Evaluation Tier I: 1 Procedure     PT G CodesQuin Hoop 11/17/2014, 4:41 PM  Roney Marion, [place] Pager [phone] Office [phone]

## 2014-11-17 NOTE — ED Notes (Signed)
Patient transported to CT 

## 2014-11-17 NOTE — ED Notes (Signed)
Called respiratory.   They will come to patient's room.

## 2014-11-17 NOTE — ED Notes (Signed)
Pt not able to tolerate CT scan; Md aware;

## 2014-11-17 NOTE — Progress Notes (Signed)
[facility] PROGRESS NOTE  [name] [name] KGU:[id number] DOB: 03/08/1951 DOA: 11/17/2014 PCP: Philis Fendt, MD  brief narrative 64 year old male with hypertension, hyperlipidemia, GERD and diastolic CHF, diabetes mellitus, CAD status CABG, COPD, chronic kidney disease, OSA with trach (ventilator dependent at night) presented with one-day history of productive cough or shortness of breath. Patient found to be tachycardic, tachypneic and febrile in the ED. Symptoms likely due to bronchitis versus early pneumonia.  Assessment/Plan: SIRS Likely secondary to acute bronchitis versus early pneumonia. Placed on empiric Levaquin. Follow blood culture, sputum culture, urine first trip antigen and Legionella. Continue trach. PCR negative. Supportive care with Tylenol and antitussives. Patient issued 1 L normal saline bolus in the ED followed by maintenance fluid. Will discontinue for the fluid.  Acute on chronic kidney disease stage III Creatinine elevated to 2.45 from baseline of 1.7-2.2. Monitor following IV hydration. Hold diuretics.  Diastolic CHF Has 1+ pitting edema bilaterally. Receiving IV hydration due to SIRS/sepsis. Holding diuretic for now given worsened renal function. Resume once his renal function improved.  COPD Has some rhonchi on exam. Will place on albuterol nebs . Resume home medications  OSA status post trach and vent dependent Respiratory therapist Consulted  Coronary artery disease s/p CABG Continue aspirin and Plavix.   diet: Heart healthy  DVT prophylaxis: Subcutaneous   Code Status:  full code Family Communication: Daughter at bedside disposition/plan: Continue inpatient monitoring  Consultants:None  Procedures: renal US    Antibiotics:  IV levaquin  HPI/Subjective: Patient seen and examined. Denies any respiratory distress. Admission H&P reviewed   Objective: Filed Vitals:   11/17/14 1100  BP: 128/76  Pulse: 107  Temp:   Resp: 14     Intake/Output Summary (Last 24 hours) at 11/17/14 1305 Last data filed at 11/17/14 6283  Gross per 24 hour  Intake    150 ml  Output   1375 ml  Net  -1225 ml   There were no vitals filed for this visit.  Exam:   General:  middle-aged obese male in no acute distress, trached  HEENT:  on trach collar, no pallor, moist oral mucosa, no JVD  Cardiovascular:S1 and S2, systolic murmur 2/6  Respiratory: Scattered rhonchi over left lung base , crackles or wheeze  GI: soft, nondistended, nontender, bowel sounds present  Musculoskeletal:  warm, 1+ pitting edema bilaterally  CNS: Alert and oriented  Data Reviewed: Basic Metabolic Panel:  Recent Labs Lab 11/17/14 0142  NA 138  K 3.4*  CL 82*  CO2 38*  GLUCOSE 154*  BUN 60*  CREATININE 2.45*  CALCIUM 9.4   Liver Function Tests: No results for input(s): AST, ALT, ALKPHOS, BILITOT, PROT, ALBUMIN in the last 168 hours. No results for input(s): LIPASE, AMYLASE in the last 168 hours. No results for input(s): AMMONIA in the last 168 hours. CBC:  Recent Labs Lab 11/13/14 1250 11/17/14 0142  WBC  --  10.2  NEUTROABS  --  8.4*  HGB 7.0* 7.9*  HCT  --  28.5*  MCV  --  92.2  PLT  --  176   Cardiac Enzymes: No results for input(s): CKTOTAL, CKMB, CKMBINDEX, TROPONINI in the last 168 hours. BNP (last 3 results)  Recent Labs  11/17/14 0838  BNP 101.1*    ProBNP (last 3 results) No results for input(s): PROBNP in the last 8760 hours.  CBG: No results for input(s): GLUCAP in the last 168 hours.  No results found for this or any previous visit (from the past 240  hour(s)).   Studies: Dg Chest 2 View  11/17/2014   CLINICAL DATA:  Shortness of breath and cough since Sunday  EXAM: CHEST  2 VIEW  COMPARISON:  07/17/2014  FINDINGS: There is chronic cardiopericardial enlargement and upper mediastinal widening post CABG. Tracheostomy tube which is well seated.  Cephalized blood flow and interstitial coarsening. Chronic  thick pleural calcification of the left more than right lower chest. No significant effusion. No pneumothorax.  IMPRESSION: 1. Probable mild CHF. 2. Bilateral fibrothorax.   Electronically Signed   By: Monte Fantasia M.D.   On: 11/17/2014 02:39   US Renal  11/17/2014   CLINICAL DATA:  Acute kidney injury, hypertension, diabetes, chronic kidney disease stage 3, creatinine 2.45  EXAM: RENAL/URINARY TRACT ULTRASOUND COMPLETE  COMPARISON:  CT abdomen and pelvis 04/12/2011  FINDINGS: Examination limited by body habitus and inability to lie flat.  Right Kidney:  Length: 12.1 cm. Normal cortical thickness. Increased cortical echogenicity. No mass, hydronephrosis or shadowing calcification.  Left Kidney:  Length: 11.5 cm. Normal cortical thickness. Increased cortical echogenicity. No hydronephrosis or shadowing calcifications. Hypoechoic nodules exophytic at inferior pole,13 x 11 x 12 mm and 17 x 15 x 15 mm, containing low level internal echoes.  Bladder:  Appears normal for degree of bladder distention.  IMPRESSION: Echogenic renal cortices bilaterally consistent with medical renal disease.  Questionable tiny complicated cysts fell lower pole LEFT kidney versus simple cysts with internal artifacts due to body habitus ; recommend either followup MR imaging without contrast to characterize or followup ultrasound in 6 months to assess stability.   Electronically Signed   By: Lavonia Dana M.D.   On: 11/17/2014 07:18    Scheduled Meds: . allopurinol  100 mg Oral Daily  . arformoterol  15 mcg Nebulization BID  . aspirin EC  81 mg Oral Daily  . budesonide  0.5 mg Nebulization BID  . clopidogrel  75 mg Oral Daily  . colchicine  0.6 mg Oral Daily  . dextromethorphan-guaiFENesin  1 tablet Oral BID  . heparin  5,000 Units Subcutaneous 3 times per day   Continuous Infusions: . levofloxacin (LEVAQUIN) IV        Time spent: 25 minutes    [name], [place]  [facility] Pager [phone]. If 7PM-7AM, please  contact night-coverage at [email or web address], password [facility] 11/17/2014, 1:05 PM  LOS: 0 days

## 2014-11-17 NOTE — Care Management Utilization Note (Signed)
UR completed.    Assad Harbeson Wise Tanaiya Kolarik, RN, BSN Phone #[phone]  

## 2014-11-17 NOTE — ED Notes (Signed)
Attempted report to 3E. 

## 2014-11-18 DIAGNOSIS — J181 Lobar pneumonia, unspecified organism: Secondary | ICD-10-CM

## 2014-11-18 DIAGNOSIS — J9612 Chronic respiratory failure with hypercapnia: Secondary | ICD-10-CM | POA: Diagnosis present

## 2014-11-18 LAB — RESPIRATORY VIRUS PANEL
Adenovirus: NEGATIVE
Influenza A: NEGATIVE
Influenza B: NEGATIVE
Metapneumovirus: NEGATIVE
PARAINFLUENZA 1 A: NEGATIVE
Parainfluenza 2: NEGATIVE
Parainfluenza 3: NEGATIVE
RESPIRATORY SYNCYTIAL VIRUS A: NEGATIVE
RESPIRATORY SYNCYTIAL VIRUS B: NEGATIVE
Rhinovirus: NEGATIVE

## 2014-11-18 LAB — LEGIONELLA ANTIGEN, URINE

## 2014-11-18 LAB — BASIC METABOLIC PANEL
ANION GAP: 13 (ref 5–15)
BUN: 58 mg/dL — ABNORMAL HIGH (ref 6–23)
CALCIUM: 9.4 mg/dL (ref 8.4–10.5)
CO2: 42 mmol/L (ref 19–32)
Chloride: 83 mmol/L — ABNORMAL LOW (ref 96–112)
Creatinine, Ser: 2.43 mg/dL — ABNORMAL HIGH (ref 0.50–1.35)
GFR calc Af Amer: 31 mL/min — ABNORMAL LOW (ref >90)
GFR, EST NON AFRICAN AMERICAN: 27 mL/min — AB (ref >90)
Glucose, Bld: 130 mg/dL — ABNORMAL HIGH (ref 70–99)
POTASSIUM: 3.5 mmol/L (ref 3.5–5.1)
Sodium: 138 mmol/L (ref 135–145)

## 2014-11-18 LAB — GLUCOSE, CAPILLARY
GLUCOSE-CAPILLARY: 129 mg/dL — AB (ref 70–99)
GLUCOSE-CAPILLARY: 138 mg/dL — AB (ref 70–99)
Glucose-Capillary: 97 mg/dL (ref 70–99)
Glucose-Capillary: 276 mg/dL — ABNORMAL HIGH (ref 70–99)

## 2014-11-18 LAB — UREA NITROGEN, URINE: UREA NITROGEN UR: 360 mg/dL

## 2014-11-18 LAB — BRAIN NATRIURETIC PEPTIDE: B Natriuretic Peptide: 44.4 pg/mL (ref 0.0–100.0)

## 2014-11-18 MED ORDER — PREDNISONE 50 MG PO TABS
50.0000 mg | ORAL_TABLET | Freq: Every day | ORAL | Status: DC
  Administered 2014-11-18 – 2014-11-19 (×2): 50 mg via ORAL
  Filled 2014-11-18 (×3): qty 1

## 2014-11-18 MED ORDER — INSULIN ASPART 100 UNIT/ML ~~LOC~~ SOLN
0.0000 [IU] | Freq: Three times a day (TID) | SUBCUTANEOUS | Status: DC
  Administered 2014-11-18 – 2014-11-19 (×3): 1 [IU] via SUBCUTANEOUS

## 2014-11-18 MED ORDER — INSULIN ASPART 100 UNIT/ML ~~LOC~~ SOLN
0.0000 [IU] | Freq: Every day | SUBCUTANEOUS | Status: DC
  Administered 2014-11-18: 3 [IU] via SUBCUTANEOUS

## 2014-11-18 MED ORDER — IPRATROPIUM-ALBUTEROL 0.5-2.5 (3) MG/3ML IN SOLN
3.0000 mL | RESPIRATORY_TRACT | Status: DC
  Administered 2014-11-18 – 2014-11-19 (×7): 3 mL via RESPIRATORY_TRACT
  Filled 2014-11-18 (×6): qty 3

## 2014-11-18 NOTE — Progress Notes (Signed)
[facility] PROGRESS NOTE  [name] [name] FBP:[id number] DOB: 1951-04-05 DOA: 11/17/2014 PCP: Philis Fendt, MD  Brief narrative 64 year old male with hypertension, hyperlipidemia, GERD and diastolic CHF, diabetes mellitus, CAD status CABG, COPD, chronic kidney disease, OSA with trach (ventilator dependent at night) presented with one-day history of productive cough or shortness of breath. Patient found to be tachycardic, tachypneic and febrile in the ED. CT chest done on 4/5 shows Rt middle lobe PNA and atelectasis.  Assessment/Plan: SIRS due to Community acquired pneumonia - Placed on empiric Levaquin. Follow blood culture, sputum culture, urine strep antigen and Legionella. Continue trach. Flu PCR and resp viral panel negative. Supportive care with Tylenol and antitussives. -wheezy on exam. Will add duoneb and oral prednisone. continue deep suctioning. Will get pulmonary to evaluate if not improved. ( sees ramaswamy)  Acute on chronic kidney disease stage III Creatinine elevated to 2.45 from baseline of 1.7-2.2. continue to Hold diuretics. Renal US shows medical renal ds. No obstruction.    OSA status post trach and vent dependent with hypercapnic  resp failure cotnieu trach. suctioning prn. Duonebs. Noted for elevated co2. Hold diuretics.  Diastolic CHF Has 1+ pitting edema bilaterally. Receiving IV hydration due to SIRS/sepsis. Holding diuretic  given worsened renal function. Check BNP. If elevated willg et a 2D echo  COPD - rhonchi on exam. Switch to scheduled duoneb and add prednisone.. continue home inhalers   Coronary artery disease s/p CABG Continue aspirin and Plavix.  DM type 2  not on meds. Monitor on SSI   diet: Heart healthy  DVT prophylaxis: Subcutaneous Heparin  Code Status: full code  Family Communication: none at bedside  disposition/plan: Continue stepdown monitoring  Consultants:None  Procedures:  renal US CT  chest     Antibiotics:  IV levaquin  HPI/Subjective: Patient seen and examined. Still has off and on shortness of breath, productive cough. afebrile  Objective: Filed Vitals:   11/18/14 1000  BP: 141/50  Pulse: 94  Temp:   Resp: 19    Intake/Output Summary (Last 24 hours) at 11/18/14 1038 Last data filed at 11/18/14 1000  Gross per 24 hour  Intake    358 ml  Output   1500 ml  Net  -1142 ml   Filed Weights   11/17/14 1356 11/17/14 2120  Weight: 138.529 kg (305 lb 6.4 oz) 140.5 kg (309 lb 11.9 oz)    Exam:   General: obese male in no acute distress, trached  HEENT: on trach collar, moist oral mucosa, no JVD  Cardiovascular:S1 and S2 normal , systolic murmur 2/6  Respiratory: wheezing over left lung base , rt basal crackles  GI: soft, nondistended, nontender, bowel sounds present  Musculoskeletal: warm, 1+ pitting edema bilaterally  CNS: Alert and oriented  Data Reviewed: Basic Metabolic Panel:  Recent Labs Lab 11/17/14 0142 11/18/14 0311  NA 138 138  K 3.4* 3.5  CL 82* 83*  CO2 38* 42*  GLUCOSE 154* 130*  BUN 60* 58*  CREATININE 2.45* 2.43*  CALCIUM 9.4 9.4   Liver Function Tests: No results for input(s): AST, ALT, ALKPHOS, BILITOT, PROT, ALBUMIN in the last 168 hours. No results for input(s): LIPASE, AMYLASE in the last 168 hours. No results for input(s): AMMONIA in the last 168 hours. CBC:  Recent Labs Lab 11/13/14 1250 11/17/14 0142  WBC  --  10.2  NEUTROABS  --  8.4*  HGB 7.0* 7.9*  HCT  --  28.5*  MCV  --  92.2  PLT  --  176   Cardiac Enzymes: No results for input(s): CKTOTAL, CKMB, CKMBINDEX, TROPONINI in the last 168 hours. BNP (last 3 results)  Recent Labs  11/17/14 0838 11/18/14 0914  BNP 101.1* 44.4    ProBNP (last 3 results) No results for input(s): PROBNP in the last 8760 hours.  CBG:  Recent Labs Lab 11/18/14 0832  GLUCAP 97    Recent Results (from the past 240 hour(s))  Culture, respiratory  (NON-Expectorated)     Status: None (Preliminary result)   Collection Time: 11/17/14  2:13 AM  Result Value Ref Range Status   Specimen Description TRACHEAL ASPIRATE  Final   Special Requests NONE  Final   Gram Stain   Final    ABUNDANT WBC PRESENT, PREDOMINANTLY PMN NO SQUAMOUS EPITHELIAL CELLS SEEN MODERATE GRAM POSITIVE COCCI IN PAIRS IN CHAINS RARE GRAM POSITIVE RODS RARE GRAM NEGATIVE RODS    Culture   Final    Culture reincubated for better growth Performed at [facility]    Report Status PENDING  Incomplete  Culture, blood (routine x 2)     Status: None (Preliminary result)   Collection Time: 11/17/14  4:00 AM  Result Value Ref Range Status   Specimen Description BLOOD RIGHT HAND  Final   Special Requests BOTTLES DRAWN AEROBIC AND ANAEROBIC 5CC  Final   Culture   Final           BLOOD CULTURE RECEIVED NO GROWTH TO DATE CULTURE WILL BE HELD FOR 5 DAYS BEFORE ISSUING A FINAL NEGATIVE REPORT Performed at [facility]    Report Status PENDING  Incomplete  Culture, blood (routine x 2)     Status: None (Preliminary result)   Collection Time: 11/17/14  4:06 AM  Result Value Ref Range Status   Specimen Description BLOOD LEFT HAND  Final   Special Requests BOTTLES DRAWN AEROBIC ONLY 4CC  Final   Culture   Final           BLOOD CULTURE RECEIVED NO GROWTH TO DATE CULTURE WILL BE HELD FOR 5 DAYS BEFORE ISSUING A FINAL NEGATIVE REPORT Performed at [facility]    Report Status PENDING  Incomplete  MRSA PCR Screening     Status: None   Collection Time: 11/17/14 10:18 PM  Result Value Ref Range Status   MRSA by PCR NEGATIVE NEGATIVE Final    Comment:        The GeneXpert MRSA Assay (FDA approved for NASAL specimens only), is one component of a comprehensive MRSA colonization surveillance program. It is not intended to diagnose MRSA infection nor to guide or monitor treatment for MRSA infections.      Studies: Dg Chest 2 View  11/17/2014    CLINICAL DATA:  Shortness of breath and cough since Sunday  EXAM: CHEST  2 VIEW  COMPARISON:  07/17/2014  FINDINGS: There is chronic cardiopericardial enlargement and upper mediastinal widening post CABG. Tracheostomy tube which is well seated.  Cephalized blood flow and interstitial coarsening. Chronic thick pleural calcification of the left more than right lower chest. No significant effusion. No pneumothorax.  IMPRESSION: 1. Probable mild CHF. 2. Bilateral fibrothorax.   Electronically Signed   By: Monte Fantasia M.D.   On: 11/17/2014 02:39   Ct Chest Wo Contrast  11/17/2014   CLINICAL DATA:  Tracheostomy with purulent secretions with cough. Sore throat. Chronically widened mediastinum.  EXAM: CT CHEST WITHOUT CONTRAST  TECHNIQUE: Multidetector CT imaging of the chest was performed following the standard protocol without  IV contrast.  COMPARISON:  Multiple exams, including 07/24/2013 and 11/17/2014  FINDINGS: Mediastinum/Nodes: Tracheostomy in place without abnormal fluid collection around the tube and with the tube extending into the trachea. Moderately distended oropharynx.  Mild thyroid goiter extending into the upper mediastinum. Atherosclerotic aortic arch and branch vessels. Prior CABG.  Scattered small mediastinal lymph nodes are not pathologically enlarged by size criteria. Moderate cardiomegaly observed.  Lungs/Pleura: Thick posterior pleural rind sub with calcified margins, similar to prior.  Paraseptal emphysema. Confluent multilobular airspace opacity in the right middle lobe, primarily peribronchovascular, images 34 through 41 of series 3. Volume loss with airspace opacities in both lower lobes, similar on the left compared to 07/24/13, but increased on the right since that time, with associated airway thickening and airway plugging.  Upper abdomen: Cholelithiasis.  Musculoskeletal: Thoracic spondylosis.  IMPRESSION: 1. Right middle lobe peribronchovascular airspace opacity suspicious for  pneumonia. 2. New airspace opacities in the right lower lobe, with associated airway thickening and airway plugging, likely representing a combination of atelectasis and pneumonia. 3. Chronic scarring, left lower lobe. 4. Bilateral fibrothorax, stable. 5. Cholelithiasis. 6. Atherosclerosis with moderate cardiomegaly. 7. Mild thyroid goiter extending into the upper mediastinum. Tracheostomy tube in place.   Electronically Signed   By: Van Clines M.D.   On: 11/17/2014 13:15   US Renal  11/17/2014   CLINICAL DATA:  Acute kidney injury, hypertension, diabetes, chronic kidney disease stage 3, creatinine 2.45  EXAM: RENAL/URINARY TRACT ULTRASOUND COMPLETE  COMPARISON:  CT abdomen and pelvis 04/12/2011  FINDINGS: Examination limited by body habitus and inability to lie flat.  Right Kidney:  Length: 12.1 cm. Normal cortical thickness. Increased cortical echogenicity. No mass, hydronephrosis or shadowing calcification.  Left Kidney:  Length: 11.5 cm. Normal cortical thickness. Increased cortical echogenicity. No hydronephrosis or shadowing calcifications. Hypoechoic nodules exophytic at inferior pole,13 x 11 x 12 mm and 17 x 15 x 15 mm, containing low level internal echoes.  Bladder:  Appears normal for degree of bladder distention.  IMPRESSION: Echogenic renal cortices bilaterally consistent with medical renal disease.  Questionable tiny complicated cysts fell lower pole LEFT kidney versus simple cysts with internal artifacts due to body habitus ; recommend either followup MR imaging without contrast to characterize or followup ultrasound in 6 months to assess stability.   Electronically Signed   By: Lavonia Dana M.D.   On: 11/17/2014 07:18    Scheduled Meds: . allopurinol  100 mg Oral Daily  . arformoterol  15 mcg Nebulization BID  . aspirin EC  81 mg Oral Daily  . budesonide  0.5 mg Nebulization BID  . clopidogrel  75 mg Oral Daily  . colchicine  0.6 mg Oral Daily  . dextromethorphan-guaiFENesin  1  tablet Oral BID  . heparin  5,000 Units Subcutaneous 3 times per day  . insulin aspart  0-5 Units Subcutaneous QHS  . insulin aspart  0-9 Units Subcutaneous TID WC  . ipratropium-albuterol  3 mL Nebulization Q4H  . [START ON 11/19/2014] levofloxacin (LEVAQUIN) IV  750 mg Intravenous Q48H  . predniSONE  50 mg Oral Q breakfast   Continuous Infusions:     Time spent: 25 minutes    [name] [name]  [facility] [phone]. If 7PM-7AM, please contact night-coverage at [email or web address], password [facility] 11/18/2014, 10:38 AM  LOS: 1 day

## 2014-11-18 NOTE — Progress Notes (Signed)
Pts.  CO2 was 42 this morning was called by lab as critical value. O2 sats in high 90's 25% trach collar. occsional labored breathing after suctioning. Appears comfortable sitting in the recliner. Rema Jasmine made aware. Will continue to monitor patient.

## 2014-11-18 NOTE — Evaluation (Signed)
Occupational Therapy Evaluation Patient Details Name: [name] [name] MRN: [id number] DOB: 03/08/1951 Today's Date: 11/18/2014    History of Present Illness HPI: [name] [name] is a 64 y.o. male with past medical history of hypertension, hyperlipidemia, GERD, gout, diastolic congestive heart failure, diabetes mellitus, CAD post status of CABG, TIA, COPD, chronic kidney disease, OSA, trach in placement, ventilator dependent nightly, who presents with productive cough and shortness breath.   Clinical Impression   Pt ambulates with a cane and is assisted for IADL, tub transfers, trach care, and occasionally for LB dressing by his wife typically.  Pt presents with generalized weakness, impaired balance, and decreased activity tolerance interfering with ability to perform self care and mobility at his baseline.  Pt likely to progress well as medical issues resolve and not require post acute OT.  Will follow acutely.    Follow Up Recommendations  No OT follow up;Supervision - Intermittent    Equipment Recommendations  None recommended by OT    Recommendations for Other Services       Precautions / Restrictions Precautions Precautions: Fall      Mobility Bed Mobility               General bed mobility comments: in chair upon arrival  Transfers Overall transfer level: Needs assistance Equipment used: Rolling walker (2 wheeled) Transfers: Sit to/from Stand Sit to Stand: Min guard         General transfer comment: Cues for hand placement, no physical assist    Balance                                            ADL Overall ADL's : Needs assistance/impaired Eating/Feeding: Independent;Sitting   Grooming: Wash/dry hands;Min guard;Standing   Upper Body Bathing: Set up;Sitting   Lower Body Bathing: Minimal assistance;Sit to/from stand   Upper Body Dressing : Set up;Sitting   Lower Body Dressing: Minimal assistance;Sit to/from stand Lower Body  Dressing Details (indicate cue type and reason): pt is able to cross his foot over opposite knee, but becomes SOB despite this method, prefers to rely on his wife as needed Toilet Transfer: Min guard;Ambulation;BSC   Toileting- Water quality scientist and Hygiene: Min guard;Sit to/from stand       Functional mobility during ADLs: Min guard;Rolling walker (pt typically uses cane) General ADL Comments: Pt currently on 28% via trach collar to maintain sats in 90s.     Vision     Perception     Praxis      Pertinent Vitals/Pain Pain Assessment: No/denies pain     Hand Dominance Right   Extremity/Trunk Assessment Upper Extremity Assessment Upper Extremity Assessment: Overall WFL for tasks assessed (L shoulder slightly weaker than R)   Lower Extremity Assessment Lower Extremity Assessment: Generalized weakness       Communication Communication Communication: No difficulties;Passy-Muir valve (or occludes with finger)   Cognition Arousal/Alertness: Awake/alert Behavior During Therapy: WFL for tasks assessed/performed Overall Cognitive Status: Within Functional Limits for tasks assessed                     General Comments       Exercises       Shoulder Instructions      Home Living Family/patient expects to be discharged to:: Private residence Living Arrangements: Spouse/significant other Available Help at Discharge: Family Type of Home: House Home Access:  Level entry     Home Layout: One level     Bathroom Shower/Tub: Teacher, early years/pre: Standard     Home Equipment: Environmental consultant - 2 wheels;Cane - quad;Shower seat;Hand held shower head;Toilet riser;Other (comment) (02)          Prior Functioning/Environment Level of Independence: Needs assistance  Gait / Transfers Assistance Needed: ambulates with cane ADL's / Homemaking Assistance Needed: wife supervises tub transfer, does trach care, helps with LB dressing sometimes and does all  cooking and cleaning   Comments: drives    OT Diagnosis: Generalized weakness   OT Problem List: Decreased activity tolerance;Impaired balance (sitting and/or standing);Cardiopulmonary status limiting activity;Obesity   OT Treatment/Interventions: Self-care/ADL training;Energy conservation;Patient/family education    OT Goals(Current goals can be found in the care plan section) Acute Rehab OT Goals Patient Stated Goal: return home with wife OT Goal Formulation: With patient Time For Goal Achievement: 11/25/14 Potential to Achieve Goals: Good ADL Goals Pt Will Perform Grooming: with modified independence;standing Pt Will Perform Lower Body Bathing: with modified independence;sit to/from stand Pt Will Perform Lower Body Dressing: with modified independence;sit to/from stand Pt Will Transfer to Toilet: with modified independence;ambulating Pt Will Perform Toileting - Clothing Manipulation and hygiene: with modified independence;sit to/from stand Additional ADL Goal #1: Pt will employ energy conservation strategies in ADL and mobility independently.  OT Frequency: Min 2X/week   Barriers to D/C:            Co-evaluation              End of Session Equipment Utilized During Treatment: Rolling walker;Oxygen  Activity Tolerance: Patient limited by fatigue Patient left: in chair;with call bell/phone within reach   Time: 1217-1240 OT Time Calculation (min): 23 min Charges:  OT General Charges $OT Visit: 1 Procedure OT Evaluation $Initial OT Evaluation Tier I: 1 Procedure OT Treatments $Self Care/Home Management : 8-22 mins G-Codes:    Malka So 11/18/2014, 2:08 PM  [phone]

## 2014-11-19 DIAGNOSIS — J9602 Acute respiratory failure with hypercapnia: Secondary | ICD-10-CM

## 2014-11-19 LAB — CBC
HEMATOCRIT: 28.4 % — AB (ref 39.0–52.0)
Hemoglobin: 8 g/dL — ABNORMAL LOW (ref 13.0–17.0)
MCH: 25.4 pg — ABNORMAL LOW (ref 26.0–34.0)
MCHC: 28.2 g/dL — ABNORMAL LOW (ref 30.0–36.0)
MCV: 90.2 fL (ref 78.0–100.0)
Platelets: 223 10*3/uL (ref 150–400)
RBC: 3.15 MIL/uL — AB (ref 4.22–5.81)
RDW: 15.9 % — ABNORMAL HIGH (ref 11.5–15.5)
WBC: 6.5 10*3/uL (ref 4.0–10.5)

## 2014-11-19 LAB — BASIC METABOLIC PANEL
ANION GAP: 14 (ref 5–15)
BUN: 52 mg/dL — ABNORMAL HIGH (ref 6–23)
CHLORIDE: 87 mmol/L — AB (ref 96–112)
CO2: 38 mmol/L — AB (ref 19–32)
Calcium: 9.3 mg/dL (ref 8.4–10.5)
Creatinine, Ser: 2.18 mg/dL — ABNORMAL HIGH (ref 0.50–1.35)
GFR calc Af Amer: 36 mL/min — ABNORMAL LOW (ref >90)
GFR calc non Af Amer: 31 mL/min — ABNORMAL LOW (ref >90)
GLUCOSE: 163 mg/dL — AB (ref 70–99)
POTASSIUM: 3.7 mmol/L (ref 3.5–5.1)
SODIUM: 139 mmol/L (ref 135–145)

## 2014-11-19 LAB — GLUCOSE, CAPILLARY: GLUCOSE-CAPILLARY: 145 mg/dL — AB (ref 70–99)

## 2014-11-19 MED ORDER — LEVOFLOXACIN 750 MG PO TABS: 750.0000 mg | ORAL_TABLET | ORAL | Status: AC

## 2014-11-19 MED ORDER — PREDNISONE 50 MG PO TABS: ORAL_TABLET | ORAL | Status: AC

## 2014-11-19 MED ORDER — ARFORMOTEROL TARTRATE 15 MCG/2ML IN NEBU: 15.0000 ug | INHALATION_SOLUTION | Freq: Two times a day (BID) | RESPIRATORY_TRACT | Status: DC

## 2014-11-19 MED ORDER — DM-GUAIFENESIN ER 30-600 MG PO TB12
1.0000 | ORAL_TABLET | Freq: Two times a day (BID) | ORAL | Status: DC
Start: 2014-11-19 — End: 2015-06-01

## 2014-11-19 NOTE — Discharge Instructions (Signed)

## 2014-11-19 NOTE — Discharge Summary (Signed)
Physician Discharge Summary  [name] [name] GGY:[id number] DOB: [birth year] DOA: 11/17/2014  PCP: Philis Fendt, MD  Admit date: 11/17/2014 Discharge date: 11/19/2014  Time spent: 35 minutes  Recommendations for Outpatient Follow-up:  1. Discharge Home with outpt follow up with PCP and pulmonologist ( Dr Chase Caller). Will complete abx on  10/23/2014.  Discharge Diagnoses:  Principle problem SIRS due to pneumonia  Active Problems:     Acute respiratory failure with hypoxia   Lobar pneumonia due to unspecified organism   OSA (obstructive sleep apnea)   CAD (coronary artery disease)   Chronic diastolic CHF (congestive heart failure)   COPD (chronic obstructive pulmonary disease)   RESPIRATORY FAILURE Noct vent dep    PVD (peripheral vascular disease)   CKD (chronic kidney disease) stage 3, GFR 30-59 ml/min   Type 2 diabetes mellitus with vascular disease   Anemia, past Hx GI bleed 5/12   Morbid obesity    Tracheostomy status   Acute on chronic renal failure   Hypercapnic respiratory failure, chronic   Discharge Condition: fair  Diet recommendation: heart healthy  Filed Weights   11/17/14 1356 11/17/14 2120  Weight: 138.529 kg (305 lb 6.4 oz) 140.5 kg (309 lb 11.9 oz)    History of present illness:  64 year old male with hypertension, hyperlipidemia, GERD and diastolic CHF, diabetes mellitus, CAD status CABG, COPD, chronic kidney disease, OSA with trach (ventilator dependent at night) presented with one-day history of productive cough or shortness of breath. Patient found to be tachycardic, tachypneic and febrile in the ED. CT chest done on 4/5 showed Rt middle lobe PNA and atelectasis.  Hospital Course:  SIRS with acute on chronic hypercapnic resp failure due to Community acquired pneumonia - Placed on empiric Levaquin. Cultures negative.  Flu PCR and resp viral panel negative.  -added prednisone and scheduled nebs for wheezing . Symptoms much improved. Will d/c on oral  Levaquin to complete 7 day course, oral prednisone for 5 days. Pt to continue home brovana nebs, budesonide nebs and albuterol nebs prn. Prescribed antitussives.   Acute on chronic kidney disease stage III Lasix held due to worsened renal fn. Now returned to baseline. Resume lasix.   OSA status post trach and vent dependent with hypercapnic resp failure Continue trach. Improved with suctioning.   Diastolic CHF Stable. Resume lasix   COPD Added a course of prednisone . resume home nebs    Coronary artery disease s/p CABG Continue aspirin and Plavix.  DM type 2 not on meds.    diet: Heart healthy    Code Status: full code  Family Communication: none at bedside  disposition/plan: home with [facility].  Consultants:None  Procedures: renal US CT chest     Antibiotics:   levaquin   Discharge Exam: Filed Vitals:   11/19/14 0804  BP: 128/84  Pulse: 88  Temp: 97.4 F (36.3 C)  Resp: 19     General: obese male in no acute distress, trached  HEENT: on trach collar, moist oral mucosa, no JVD  Cardiovascular:S1 and S2 normal , systolic murmur 2/6  Respiratory: clear b/l, rt basal crackles  GI: soft, nondistended, nontender, bowel sounds present  Musculoskeletal: warm, no edema  CNS: Alert and oriented  Discharge Instructions    Current Discharge Medication List    START taking these medications   Details  dextromethorphan-guaiFENesin (MUCINEX DM) 30-600 MG per 12 hr tablet Take 1 tablet by mouth 2 (two) times daily. Qty: 10 tablet, Refills: 0    levofloxacin (LEVAQUIN) 750 MG  tablet Take 1 tablet (750 mg total) by mouth every other day. Qty: 3 tablet, Refills: 0    predniSONE (DELTASONE) 50 MG tablet Take 1 tablet daily for 5 days Qty: 5 tablet, Refills: 0      CONTINUE these medications which have CHANGED   Details  arformoterol (BROVANA) 15 MCG/2ML NEBU Take 2 mLs (15 mcg total) by nebulization 2 (two) times daily. Qty: 120 mL,  Refills: 2      CONTINUE these medications which have NOT CHANGED   Details  acetaminophen (TYLENOL) 500 MG tablet Take 500 mg by mouth every 6 (six) hours as needed for mild pain or moderate pain.    albuterol (PROVENTIL) (2.5 MG/3ML) 0.083% nebulizer solution Take 3 mLs (2.5 mg total) by nebulization every 4 (four) hours as needed for wheezing. Qty: 360 mL, Refills: 5    allopurinol (ZYLOPRIM) 100 MG tablet Take 1 tablet by mouth daily.    aspirin 81 MG tablet Take 81 mg by mouth daily.    budesonide (PULMICORT) 0.5 MG/2ML nebulizer solution Take 0.5 mg by nebulization 2 (two) times daily.    clopidogrel (PLAVIX) 75 MG tablet Take 1 tablet (75 mg total) by mouth daily. Qty: 30 tablet, Refills: 10    COLCRYS 0.6 MG tablet Take 0.6 mg by mouth daily.     furosemide (LASIX) 40 MG tablet Take 1 tablet (40 mg total) by mouth 2 (two) times daily. Qty: 90 tablet, Refills: 3    metolazone (ZAROXOLYN) 2.5 MG tablet TAKE ONE TABLET BY MOUTH ONCE DAILY ON  MONDAY,  WEDNESDAY,  AND  FRIDAY  30  MINUTES  BEFORE  LASIX Qty: 30 tablet, Refills: 5    nitroGLYCERIN (NITROSTAT) 0.4 MG SL tablet Place 1 tablet (0.4 mg total) under the tongue every 5 (five) minutes as needed. For chest pain Qty: 25 tablet, Refills: 6      STOP taking these medications     acetaminophen (TYLENOL) 160 MG/5ML solution        Allergies  Allergen Reactions  . Amlodipine Besy-Benazepril Hcl Swelling    Lips swell  . Percocet [Oxycodone-Acetaminophen] Other (See Comments)    hallucinations   Follow-up Information    Follow up with AVBUERE,EDWIN A, MD. Schedule an appointment as soon as possible for a visit in 1 week.   Specialty:  Internal Medicine   Contact information:   [address] [place] [address] [phone]       Follow up with [facility], [place]. Schedule an appointment as soon as possible for a visit in 3 weeks.   Specialty:  Pulmonary Disease   Contact information:   [place] [address] [phone]        The results of significant diagnostics from this hospitalization (including imaging, microbiology, ancillary and laboratory) are listed below for reference.    Significant Diagnostic Studies: Dg Chest 2 View  11/17/2014   CLINICAL DATA:  Shortness of breath and cough since Sunday  EXAM: CHEST  2 VIEW  COMPARISON:  07/17/2014  FINDINGS: There is chronic cardiopericardial enlargement and upper mediastinal widening post CABG. Tracheostomy tube which is well seated.  Cephalized blood flow and interstitial coarsening. Chronic thick pleural calcification of the left more than right lower chest. No significant effusion. No pneumothorax.  IMPRESSION: 1. Probable mild CHF. 2. Bilateral fibrothorax.   Electronically Signed   By: Monte Fantasia M.D.   On: 11/17/2014 02:39   Ct Chest Wo Contrast  11/17/2014   CLINICAL DATA:  Tracheostomy with purulent secretions with cough. Sore throat. Chronically widened mediastinum.  EXAM: CT CHEST WITHOUT CONTRAST  TECHNIQUE: Multidetector CT imaging of the chest was performed following the standard protocol without IV contrast.  COMPARISON:  Multiple exams, including 07/24/2013 and 11/17/2014  FINDINGS: Mediastinum/Nodes: Tracheostomy in place without abnormal fluid collection around the tube and with the tube extending into the trachea. Moderately distended oropharynx.  Mild thyroid goiter extending into the upper mediastinum. Atherosclerotic aortic arch and branch vessels. Prior CABG.  Scattered small mediastinal lymph nodes are not pathologically enlarged by size criteria. Moderate cardiomegaly observed.  Lungs/Pleura: Thick posterior pleural rind sub with calcified margins, similar to prior.  Paraseptal emphysema. Confluent multilobular airspace opacity in the right middle lobe, primarily peribronchovascular, images 34 through 41 of series 3. Volume loss with airspace opacities in both lower lobes, similar on the left compared  to 07/24/13, but increased on the right since that time, with associated airway thickening and airway plugging.  Upper abdomen: Cholelithiasis.  Musculoskeletal: Thoracic spondylosis.  IMPRESSION: 1. Right middle lobe peribronchovascular airspace opacity suspicious for pneumonia. 2. New airspace opacities in the right lower lobe, with associated airway thickening and airway plugging, likely representing a combination of atelectasis and pneumonia. 3. Chronic scarring, left lower lobe. 4. Bilateral fibrothorax, stable. 5. Cholelithiasis. 6. Atherosclerosis with moderate cardiomegaly. 7. Mild thyroid goiter extending into the upper mediastinum. Tracheostomy tube in place.   Electronically Signed   By: Van Clines M.D.   On: 11/17/2014 13:15   US Renal  11/17/2014   CLINICAL DATA:  Acute kidney injury, hypertension, diabetes, chronic kidney disease stage 3, creatinine 2.45  EXAM: RENAL/URINARY TRACT ULTRASOUND COMPLETE  COMPARISON:  CT abdomen and pelvis 04/12/2011  FINDINGS: Examination limited by body habitus and inability to lie flat.  Right Kidney:  Length: 12.1 cm. Normal cortical thickness. Increased cortical echogenicity. No mass, hydronephrosis or shadowing calcification.  Left Kidney:  Length: 11.5 cm. Normal cortical thickness. Increased cortical echogenicity. No hydronephrosis or shadowing calcifications. Hypoechoic nodules exophytic at inferior pole,13 x 11 x 12 mm and 17 x 15 x 15 mm, containing low level internal echoes.  Bladder:  Appears normal for degree of bladder distention.  IMPRESSION: Echogenic renal cortices bilaterally consistent with medical renal disease.  Questionable tiny complicated cysts fell lower pole LEFT kidney versus simple cysts with internal artifacts due to body habitus ; recommend either followup MR imaging without contrast to characterize or followup ultrasound in 6 months to assess stability.   Electronically Signed   By: Lavonia Dana M.D.   On: 11/17/2014 07:18     Microbiology: Recent Results (from the past 240 hour(s))  Culture, respiratory (NON-Expectorated)     Status: None (Preliminary result)   Collection Time: 11/17/14  2:13 AM  Result Value Ref Range Status   Specimen Description TRACHEAL ASPIRATE  Final   Special Requests NONE  Final   Gram Stain   Final    ABUNDANT WBC PRESENT, PREDOMINANTLY PMN NO SQUAMOUS EPITHELIAL CELLS SEEN MODERATE GRAM POSITIVE COCCI IN PAIRS IN CHAINS RARE GRAM POSITIVE RODS RARE GRAM NEGATIVE RODS    Culture   Final    MODERATE PSEUDOMONAS AERUGINOSA Performed at [facility]    Report Status PENDING  Incomplete  Culture, blood (routine x 2)     Status: None (Preliminary result)   Collection Time: 11/17/14  4:00 AM  Result Value Ref Range Status   Specimen Description BLOOD RIGHT HAND  Final   Special Requests BOTTLES DRAWN AEROBIC  AND ANAEROBIC 5CC  Final   Culture   Final           BLOOD CULTURE RECEIVED NO GROWTH TO DATE CULTURE WILL BE HELD FOR 5 DAYS BEFORE ISSUING A FINAL NEGATIVE REPORT Performed at [facility]    Report Status PENDING  Incomplete  Culture, blood (routine x 2)     Status: None (Preliminary result)   Collection Time: 11/17/14  4:06 AM  Result Value Ref Range Status   Specimen Description BLOOD LEFT HAND  Final   Special Requests BOTTLES DRAWN AEROBIC ONLY 4CC  Final   Culture   Final           BLOOD CULTURE RECEIVED NO GROWTH TO DATE CULTURE WILL BE HELD FOR 5 DAYS BEFORE ISSUING A FINAL NEGATIVE REPORT Performed at [facility]    Report Status PENDING  Incomplete  Respiratory virus panel     Status: None   Collection Time: 11/17/14  5:45 AM  Result Value Ref Range Status   Source - RVPAN NASAL SWAB  Corrected   Respiratory Syncytial Virus A Negative Negative Final   Respiratory Syncytial Virus B Negative Negative Final   Influenza A Negative Negative Final   Influenza B Negative Negative Final   Parainfluenza 1 Negative Negative Final    Parainfluenza 2 Negative Negative Final   Parainfluenza 3 Negative Negative Final   Metapneumovirus Negative Negative Final   Rhinovirus Negative Negative Final   Adenovirus Negative Negative Final    Comment: (NOTE) Performed At: [facility] [place], [place] [id number] Lindon Romp MD OJ:[id number]   MRSA PCR Screening     Status: None   Collection Time: 11/17/14 10:18 PM  Result Value Ref Range Status   MRSA by PCR NEGATIVE NEGATIVE Final    Comment:        The GeneXpert MRSA Assay (FDA approved for NASAL specimens only), is one component of a comprehensive MRSA colonization surveillance program. It is not intended to diagnose MRSA infection nor to guide or monitor treatment for MRSA infections.      Labs: Basic Metabolic Panel:  Recent Labs Lab 11/17/14 0142 11/18/14 0311 11/19/14 0335  NA 138 138 139  K 3.4* 3.5 3.7  CL 82* 83* 87*  CO2 38* 42* 38*  GLUCOSE 154* 130* 163*  BUN 60* 58* 52*  CREATININE 2.45* 2.43* 2.18*  CALCIUM 9.4 9.4 9.3   Liver Function Tests: No results for input(s): AST, ALT, ALKPHOS, BILITOT, PROT, ALBUMIN in the last 168 hours. No results for input(s): LIPASE, AMYLASE in the last 168 hours. No results for input(s): AMMONIA in the last 168 hours. CBC:  Recent Labs Lab 11/13/14 1250 11/17/14 0142 11/19/14 0335  WBC  --  10.2 6.5  NEUTROABS  --  8.4*  --   HGB 7.0* 7.9* 8.0*  HCT  --  28.5* 28.4*  MCV  --  92.2 90.2  PLT  --  176 223   Cardiac Enzymes: No results for input(s): CKTOTAL, CKMB, CKMBINDEX, TROPONINI in the last 168 hours. BNP: BNP (last 3 results)  Recent Labs  11/17/14 0838 11/18/14 0914  BNP 101.1* 44.4    ProBNP (last 3 results) No results for input(s): PROBNP in the last 8760 hours.  CBG:  Recent Labs Lab 11/18/14 0832 11/18/14 1215 11/18/14 1738 11/18/14 2131 11/19/14 0803  GLUCAP 97 129* 138* 276* 145*       Signed:  Nethan Caudillo  [facility] 11/19/2014, 8:19  AM    

## 2014-11-20 DIAGNOSIS — E119 Type 2 diabetes mellitus without complications: Secondary | ICD-10-CM | POA: Diagnosis not present

## 2014-11-20 DIAGNOSIS — I5031 Acute diastolic (congestive) heart failure: Secondary | ICD-10-CM | POA: Diagnosis not present

## 2014-11-20 DIAGNOSIS — I2510 Atherosclerotic heart disease of native coronary artery without angina pectoris: Secondary | ICD-10-CM | POA: Diagnosis not present

## 2014-11-20 DIAGNOSIS — Z7901 Long term (current) use of anticoagulants: Secondary | ICD-10-CM | POA: Diagnosis not present

## 2014-11-20 DIAGNOSIS — Z9981 Dependence on supplemental oxygen: Secondary | ICD-10-CM | POA: Diagnosis not present

## 2014-11-20 DIAGNOSIS — Z930 Tracheostomy status: Secondary | ICD-10-CM | POA: Diagnosis not present

## 2014-11-20 DIAGNOSIS — N183 Chronic kidney disease, stage 3 (moderate): Secondary | ICD-10-CM | POA: Diagnosis not present

## 2014-11-20 DIAGNOSIS — J449 Chronic obstructive pulmonary disease, unspecified: Secondary | ICD-10-CM | POA: Diagnosis not present

## 2014-11-21 LAB — CULTURE, RESPIRATORY

## 2014-11-21 LAB — CULTURE, RESPIRATORY W GRAM STAIN

## 2014-11-23 LAB — CULTURE, BLOOD (ROUTINE X 2)
CULTURE: NO GROWTH
CULTURE: NO GROWTH

## 2014-11-24 DIAGNOSIS — Z430 Encounter for attention to tracheostomy: Secondary | ICD-10-CM | POA: Diagnosis not present

## 2014-11-24 DIAGNOSIS — J9612 Chronic respiratory failure with hypercapnia: Secondary | ICD-10-CM | POA: Diagnosis not present

## 2014-11-24 DIAGNOSIS — E662 Morbid (severe) obesity with alveolar hypoventilation: Secondary | ICD-10-CM | POA: Diagnosis not present

## 2014-11-24 DIAGNOSIS — J449 Chronic obstructive pulmonary disease, unspecified: Secondary | ICD-10-CM | POA: Diagnosis not present

## 2014-11-24 DIAGNOSIS — Z930 Tracheostomy status: Secondary | ICD-10-CM | POA: Diagnosis not present

## 2014-11-24 DIAGNOSIS — G4733 Obstructive sleep apnea (adult) (pediatric): Secondary | ICD-10-CM | POA: Diagnosis not present

## 2014-11-25 DIAGNOSIS — Z930 Tracheostomy status: Secondary | ICD-10-CM | POA: Diagnosis not present

## 2014-11-25 DIAGNOSIS — Z7901 Long term (current) use of anticoagulants: Secondary | ICD-10-CM | POA: Diagnosis not present

## 2014-11-25 DIAGNOSIS — J449 Chronic obstructive pulmonary disease, unspecified: Secondary | ICD-10-CM | POA: Diagnosis not present

## 2014-11-25 DIAGNOSIS — Z9981 Dependence on supplemental oxygen: Secondary | ICD-10-CM | POA: Diagnosis not present

## 2014-11-25 DIAGNOSIS — E119 Type 2 diabetes mellitus without complications: Secondary | ICD-10-CM | POA: Diagnosis not present

## 2014-11-25 DIAGNOSIS — I5031 Acute diastolic (congestive) heart failure: Secondary | ICD-10-CM | POA: Diagnosis not present

## 2014-11-25 DIAGNOSIS — I2510 Atherosclerotic heart disease of native coronary artery without angina pectoris: Secondary | ICD-10-CM | POA: Diagnosis not present

## 2014-11-25 DIAGNOSIS — N183 Chronic kidney disease, stage 3 (moderate): Secondary | ICD-10-CM | POA: Diagnosis not present

## 2014-11-26 DIAGNOSIS — E784 Other hyperlipidemia: Secondary | ICD-10-CM | POA: Diagnosis not present

## 2014-11-26 DIAGNOSIS — I10 Essential (primary) hypertension: Secondary | ICD-10-CM | POA: Diagnosis not present

## 2014-11-26 DIAGNOSIS — N183 Chronic kidney disease, stage 3 (moderate): Secondary | ICD-10-CM | POA: Diagnosis not present

## 2014-11-26 DIAGNOSIS — J440 Chronic obstructive pulmonary disease with acute lower respiratory infection: Secondary | ICD-10-CM | POA: Diagnosis not present

## 2014-11-26 DIAGNOSIS — E1165 Type 2 diabetes mellitus with hyperglycemia: Secondary | ICD-10-CM | POA: Diagnosis not present

## 2014-11-27 ENCOUNTER — Encounter (HOSPITAL_COMMUNITY)
Admission: RE | Admit: 2014-11-27 | Discharge: 2014-11-27 | Disposition: A | Discharge status: Home / Self Care | Payer: Medicare Other | Source: Ambulatory Visit | Attending: Nephrology | Admitting: Nephrology

## 2014-11-27 DIAGNOSIS — Z7901 Long term (current) use of anticoagulants: Secondary | ICD-10-CM | POA: Diagnosis not present

## 2014-11-27 DIAGNOSIS — D631 Anemia in chronic kidney disease: Secondary | ICD-10-CM | POA: Diagnosis not present

## 2014-11-27 DIAGNOSIS — I5031 Acute diastolic (congestive) heart failure: Secondary | ICD-10-CM | POA: Diagnosis not present

## 2014-11-27 DIAGNOSIS — Z9981 Dependence on supplemental oxygen: Secondary | ICD-10-CM | POA: Diagnosis not present

## 2014-11-27 DIAGNOSIS — I2510 Atherosclerotic heart disease of native coronary artery without angina pectoris: Secondary | ICD-10-CM | POA: Diagnosis not present

## 2014-11-27 DIAGNOSIS — E119 Type 2 diabetes mellitus without complications: Secondary | ICD-10-CM | POA: Diagnosis not present

## 2014-11-27 DIAGNOSIS — J449 Chronic obstructive pulmonary disease, unspecified: Secondary | ICD-10-CM | POA: Diagnosis not present

## 2014-11-27 DIAGNOSIS — N183 Chronic kidney disease, stage 3 (moderate): Secondary | ICD-10-CM | POA: Diagnosis not present

## 2014-11-27 DIAGNOSIS — Z930 Tracheostomy status: Secondary | ICD-10-CM | POA: Diagnosis not present

## 2014-11-27 LAB — RENAL FUNCTION PANEL
Albumin: 3.2 g/dL — ABNORMAL LOW (ref 3.5–5.2)
Anion gap: 10 (ref 5–15)
BUN: 78 mg/dL — AB (ref 6–23)
CALCIUM: 8.6 mg/dL (ref 8.4–10.5)
CO2: 45 mmol/L (ref 19–32)
CREATININE: 2.85 mg/dL — AB (ref 0.50–1.35)
Chloride: 82 mmol/L — ABNORMAL LOW (ref 96–112)
GFR calc Af Amer: 26 mL/min — ABNORMAL LOW (ref >90)
GFR calc non Af Amer: 22 mL/min — ABNORMAL LOW (ref >90)
GLUCOSE: 245 mg/dL — AB (ref 70–99)
PHOSPHORUS: 3.2 mg/dL (ref 2.3–4.6)
Potassium: 3.3 mmol/L — ABNORMAL LOW (ref 3.5–5.1)
SODIUM: 137 mmol/L (ref 135–145)

## 2014-11-27 LAB — POCT HEMOGLOBIN-HEMACUE: HEMOGLOBIN: 7.5 g/dL — AB (ref 13.0–17.0)

## 2014-11-27 MED ORDER — DARBEPOETIN ALFA 100 MCG/0.5ML IJ SOSY
PREFILLED_SYRINGE | INTRAMUSCULAR | Status: AC
  Filled 2014-11-27: qty 0.5

## 2014-11-27 MED ORDER — DARBEPOETIN ALFA 100 MCG/0.5ML IJ SOSY
100.0000 ug | PREFILLED_SYRINGE | INTRAMUSCULAR | Status: DC
  Administered 2014-11-27: 100 ug via SUBCUTANEOUS

## 2014-11-27 NOTE — Progress Notes (Signed)
Hgb 7.5 reported to [facility] at [facility]. Orders received. Patient aware he is to go to ER if he has any SOB

## 2014-12-01 DIAGNOSIS — E119 Type 2 diabetes mellitus without complications: Secondary | ICD-10-CM | POA: Diagnosis not present

## 2014-12-01 DIAGNOSIS — N183 Chronic kidney disease, stage 3 (moderate): Secondary | ICD-10-CM | POA: Diagnosis not present

## 2014-12-01 DIAGNOSIS — I5031 Acute diastolic (congestive) heart failure: Secondary | ICD-10-CM | POA: Diagnosis not present

## 2014-12-01 DIAGNOSIS — Z930 Tracheostomy status: Secondary | ICD-10-CM | POA: Diagnosis not present

## 2014-12-01 DIAGNOSIS — Z9981 Dependence on supplemental oxygen: Secondary | ICD-10-CM | POA: Diagnosis not present

## 2014-12-01 DIAGNOSIS — I2510 Atherosclerotic heart disease of native coronary artery without angina pectoris: Secondary | ICD-10-CM | POA: Diagnosis not present

## 2014-12-01 DIAGNOSIS — Z7901 Long term (current) use of anticoagulants: Secondary | ICD-10-CM | POA: Diagnosis not present

## 2014-12-01 DIAGNOSIS — J449 Chronic obstructive pulmonary disease, unspecified: Secondary | ICD-10-CM | POA: Diagnosis not present

## 2014-12-04 ENCOUNTER — Encounter: Payer: Self-pay | Admitting: Cardiovascular Disease

## 2014-12-04 ENCOUNTER — Ambulatory Visit (INDEPENDENT_AMBULATORY_CARE_PROVIDER_SITE_OTHER): Payer: Medicare Other | Admitting: Cardiovascular Disease

## 2014-12-04 VITALS — BP 128/60 | HR 120 | Ht 73.0 in | Wt 303.0 lb

## 2014-12-04 DIAGNOSIS — I739 Peripheral vascular disease, unspecified: Secondary | ICD-10-CM | POA: Diagnosis not present

## 2014-12-04 DIAGNOSIS — I2510 Atherosclerotic heart disease of native coronary artery without angina pectoris: Secondary | ICD-10-CM

## 2014-12-04 DIAGNOSIS — I2583 Coronary atherosclerosis due to lipid rich plaque: Secondary | ICD-10-CM

## 2014-12-04 NOTE — Progress Notes (Signed)
12/04/2014 [name] [name]   08/06/1951  [id number]  Primary Physician Philis Fendt, MD Primary Cardiologist: Lorretta Harp MD Renae Gloss   HPI:  The patient is a 64 year old obese African American male with history of coronary artery disease, diastolic dysfunction, COPD who is O2 dependent, peripheral vascular disease, chronic renal insufficiency stage 3, diabetes mellitus, hypertension and obstructive sleep apnea. I last saw him in the office one year ago.The patient was hospitalized earlier in the year with acute respiratory failure and hypoxia, likely related to volume overload from diastolic dyslipidemia. He was diuresed with IV Lasix, metolazone. He was also seen from April 3rd to April 7th, apparently with a COPD exacerbation. The patient has a stent in the left SFA which was put in in September of 2009 for claudication. He also does have a short-segment occlusion of the right mid SFA with tibioperoneal disease as well. He had coronary artery bypass grafting in 2001 with PCI stenting of his OM branch in December of 2005. Last cardiac catheterization was in July of 2011 revealing a patent LIMA to the LAD, 80% stenosis beyond that, and a patent vein to the large venous branch, a total vein to the PDA which was restented using a Taxus ion drug-eluting stent with a patent circumflex to the obtuse marginal branch and normal LV function.   His last nuclear stress test was February 2015. With ejection fraction was 62%; it was a low risk study with normal nuclear imaging. No significant change from previous study. This study was done for ST changes to his EKG which are still present and look similar to previous EKG with a right bundle branch block and slightly tachycardic at 104.   The patient presents today for followup. He states that his lower extremity edema fractures and wanes in severity. He is on furosemide and Zaroxolyn and tries to avoid salt intake. He did have a  tracheostomy October 2014 at the urging of Dr. Jamal Collin. He is on chronic O2 and her a respirator at night. He specifically denies chest pain.    Current Outpatient Prescriptions  Medication Sig Dispense Refill  . acetaminophen (TYLENOL) 500 MG tablet Take 500 mg by mouth every 6 (six) hours as needed for mild pain or moderate pain.    Marland Kitchen albuterol (PROVENTIL) (2.5 MG/3ML) 0.083% nebulizer solution Take 3 mLs (2.5 mg total) by nebulization every 4 (four) hours as needed for wheezing. 360 mL 5  . allopurinol (ZYLOPRIM) 100 MG tablet Take 1 tablet by mouth daily.    Marland Kitchen arformoterol (BROVANA) 15 MCG/2ML NEBU Take 2 mLs (15 mcg total) by nebulization 2 (two) times daily. 120 mL 2  . aspirin 81 MG tablet Take 81 mg by mouth daily.    . budesonide (PULMICORT) 0.5 MG/2ML nebulizer solution Take 0.5 mg by nebulization 2 (two) times daily.    . clopidogrel (PLAVIX) 75 MG tablet Take 1 tablet (75 mg total) by mouth daily. 30 tablet 10  . COLCRYS 0.6 MG tablet Take 0.6 mg by mouth daily.     Marland Kitchen dextromethorphan-guaiFENesin (MUCINEX DM) 30-600 MG per 12 hr tablet Take 1 tablet by mouth 2 (two) times daily. 10 tablet 0  . furosemide (LASIX) 40 MG tablet Take 1 tablet (40 mg total) by mouth 2 (two) times daily. 90 tablet 3  . metolazone (ZAROXOLYN) 2.5 MG tablet TAKE ONE TABLET BY MOUTH ONCE DAILY ON  MONDAY,  WEDNESDAY,  AND  FRIDAY  30  MINUTES  BEFORE  LASIX  30 tablet 5  . nitroGLYCERIN (NITROSTAT) 0.4 MG SL tablet Place 1 tablet (0.4 mg total) under the tongue every 5 (five) minutes as needed. For chest pain 25 tablet 6   No current facility-administered medications for this visit.    Allergies  Allergen Reactions  . Amlodipine Besy-Benazepril Hcl Swelling    Lips swell  . Percocet [Oxycodone-Acetaminophen] Other (See Comments)    hallucinations    History   Social History  . Marital Status: Married    Spouse Name: N/A  . Number of Children: N/A  . Years of Education: N/A   Occupational  History  . Not on file.   Social History Main Topics  . Smoking status: Former Smoker -- 1.00 packs/day for 25 years    Types: Cigarettes    Quit date: 08/14/1998  . Smokeless tobacco: Never Used  . Alcohol Use: No  . Drug Use: No  . Sexual Activity: Yes   Other Topics Concern  . Not on file   Social History Narrative     Review of Systems: General: negative for chills, fever, night sweats or weight changes.  Cardiovascular: negative for chest pain, dyspnea on exertion, edema, orthopnea, palpitations, paroxysmal nocturnal dyspnea or shortness of breath Dermatological: negative for rash Respiratory: negative for cough or wheezing Urologic: negative for hematuria Abdominal: negative for nausea, vomiting, diarrhea, bright red blood per rectum, melena, or hematemesis Neurologic: negative for visual changes, syncope, or dizziness All other systems reviewed and are otherwise negative except as noted above.    Blood pressure 128/60, pulse 120, height 6\' 1"  (1.854 m), weight 303 lb (137.44 kg).  General appearance: alert and no distress Neck: no adenopathy, no carotid bruit, no JVD, supple, symmetrical, trachea midline and thyroid not enlarged, symmetric, no tenderness/mass/nodules Lungs: clear to auscultation bilaterally Heart: regular rate and rhythm, S1, S2 normal, no murmur, click, rub or gallop Extremities: chronic 1-2+ pitting edema  EKG not performed today  ASSESSMENT AND PLAN:   PVD (peripheral vascular disease) History of peripheral arterial disease with a known short segment occlusion of right mid SFA with tibioperoneal disease as well. Angiography was performed September 2009 because of claudication.   Hypertensive heart disease History of hypertension with blood pressure measures 126/60. He is not on any anti-antihypertensive medications at this time   CAD (coronary artery disease) History of CAD status post coronary artery bypass grafting in 2001 with PCI and  stenting of his obtuse marginal branch December 2005. His last cardiac catheterization performed July 2011 revealed a total vein to the PDA which was stented using a Taxus drug-eluting stent. He had a patent circumflex stent and normal LV function. He denies chest pain. He had a nuclear stress test performed in February 2015 that was low risk with normal EF.       Lorretta Harp MD FACP,FACC,FAHA, [facility] 12/04/2014 2:11 PM

## 2014-12-04 NOTE — Patient Instructions (Signed)
Dr Berry recommends that you schedule a follow-up appointment in 6 months with an extender.  Dr Berry wants you to follow-up in 12 months. You will receive a reminder letter in the mail two months in advance. If you don't receive a letter, please call our office to schedule the follow-up appointment. 

## 2014-12-04 NOTE — Assessment & Plan Note (Signed)
History of hypertension with blood pressure measures 126/60. He is not on any anti-antihypertensive medications at this time

## 2014-12-04 NOTE — Assessment & Plan Note (Signed)
History of CAD status post coronary artery bypass grafting in 2001 with PCI and stenting of his obtuse marginal branch December 2005. His last cardiac catheterization performed July 2011 revealed a total vein to the PDA which was stented using a Taxus drug-eluting stent. He had a patent circumflex stent and normal LV function. He denies chest pain. He had a nuclear stress test performed in February 2015 that was low risk with normal EF.

## 2014-12-04 NOTE — Assessment & Plan Note (Addendum)
History of peripheral arterial disease with a known short segment occlusion of right mid SFA with tibioperoneal disease as well. Angiography was performed September 2009 because of claudication.

## 2014-12-05 DIAGNOSIS — R0602 Shortness of breath: Secondary | ICD-10-CM | POA: Diagnosis not present

## 2014-12-10 ENCOUNTER — Ambulatory Visit (INDEPENDENT_AMBULATORY_CARE_PROVIDER_SITE_OTHER)
Admission: RE | Admit: 2014-12-10 | Discharge: 2014-12-10 | Disposition: A | Discharge status: Home / Self Care | Payer: Medicare Other | Source: Ambulatory Visit | Attending: Adult Health | Admitting: Adult Health

## 2014-12-10 ENCOUNTER — Ambulatory Visit (INDEPENDENT_AMBULATORY_CARE_PROVIDER_SITE_OTHER): Payer: Medicare Other | Admitting: Adult Health

## 2014-12-10 ENCOUNTER — Encounter: Payer: Self-pay | Admitting: Adult Health

## 2014-12-10 VITALS — BP 122/68 | HR 109 | Temp 98.1°F | Wt 303.0 lb

## 2014-12-10 DIAGNOSIS — J181 Lobar pneumonia, unspecified organism: Secondary | ICD-10-CM

## 2014-12-10 DIAGNOSIS — J449 Chronic obstructive pulmonary disease, unspecified: Secondary | ICD-10-CM

## 2014-12-10 DIAGNOSIS — J9612 Chronic respiratory failure with hypercapnia: Secondary | ICD-10-CM

## 2014-12-10 DIAGNOSIS — R05 Cough: Secondary | ICD-10-CM | POA: Diagnosis not present

## 2014-12-10 DIAGNOSIS — I5032 Chronic diastolic (congestive) heart failure: Secondary | ICD-10-CM | POA: Diagnosis not present

## 2014-12-10 NOTE — Assessment & Plan Note (Signed)
Improving w/ less edema and wt loss.   Plan  Cont on current regimen  follow up Dr. Chase Caller in 4 weeks with chest xray  Please contact office for sooner follow up if symptoms do not improve or worsen or seek emergency care

## 2014-12-10 NOTE — Patient Instructions (Signed)
Mucinex DM Twice daily  As needed  Cough/congestion  Continue on Oxygen 4l/m trach collar   And Vent At bedtime   O2 sat goal is 90-92% , do not turn up if sats are okay.  Order to Home care for walker with seat/basklet.  follow up Dr. Chase Caller in 4 weeks with chest xray  Please contact office for sooner follow up if symptoms do not improve or worsen or seek emergency care

## 2014-12-10 NOTE — Assessment & Plan Note (Signed)
Recent flare now resolving   Plan  Mucinex DM Twice daily  As needed  Cough/congestion  Continue on Oxygen 4l/m trach collar   And Vent At bedtime   O2 sat goal is 90-92% , do not turn up if sats are okay.  Order to Home care for walker with seat/basklet.  follow up Dr. Chase Caller in 4 weeks with chest xray  Please contact office for sooner follow up if symptoms do not improve or worsen or seek emergency care

## 2014-12-10 NOTE — Assessment & Plan Note (Signed)
Mucinex DM Twice daily  As needed  Cough/congestion  Continue on Oxygen 4l/m trach collar   And Vent At bedtime   O2 sat goal is 90-92% , do not turn up if sats are okay.  Order to Home care for walker with seat/basklet.  follow up Dr. Chase Caller in 4 weeks with chest xray  Please contact office for sooner follow up if symptoms do not improve or worsen or seek emergency care

## 2014-12-10 NOTE — Assessment & Plan Note (Addendum)
Clinically improved  cxr w/ persistent opacities  Will repeat cxr on return .    Plan  Mucinex DM Twice daily  As needed  Cough/congestion  Continue on Oxygen 4l/m trach collar   And Vent At bedtime   O2 sat goal is 90-92% , do not turn up if sats are okay.  Order to Home care for walker with seat/basklet.  follow up Dr. Chase Caller in 4 weeks with chest xray  Please contact office for sooner follow up if symptoms do not improve or worsen or seek emergency care

## 2014-12-10 NOTE — Progress Notes (Signed)
Subjective:    Patient ID: [name] [name], male    DOB: [birth year], 64 y.o.   MRN: [id number]   HPI   HPI 01/23/11- COPD, CAD/diastolic dysfunction, OSA, chronic respiratory failure, obesity Hypoventilation, cor pulmonale Hosp since last here- once for "shakes"-they told him wasn't using CPAP right, once for epistaxis, once to place urinary stent, and has had a right ? renal biopsy. Told anemic.  Breathing stable, but can't lie flat- feels smothered even on O2.  Runs O2 2-3L/M usually. I gave permision to go to 4 during exertion. Notes "tiresome" feeling midchest, related to exertion/ climbing stairs and relieved by rest. Told to pace himself and not try to push through that.  Discussed need for O2. He doesn't sleep well with his BiPAP 15/12. Discussed sleep hygiene- discussed room temperature. He was more comfortable with autotitration in hosp and we discussed a trial of that at home.  Has restarted diuretic since leg edema has come back.   05/25/11-  COPD, CAD/diastolic dysfunction, OSA, chronic respiratory failure, obesity Hypoventilation, cor pulmonale Went to ER last week- short of breath. Air wasn't satisfying. Was started back on shot for anemia. Feels some better.  Reports sneezing, postnasal drainage and a sense of mucus in his upper chest. Denies fever, sore throat and doesn't think he has a cold.  06/28/11-  COPD, CAD/diastolic dysfunction, OSA, chronic respiratory failure, obesity Hypoventilation, cor pulmonale Recently hospitalized October 25-29, notes reviewed with him and x-ray images reviewed by me. He was hospitalized for hypercapnic respiratory failure with transient encephalopathy and renal insufficiency. Since discharge he has regained some ankle edema while off of furosemide. He has a nephrology appointment later this month. He is concerned that he was taken off his diabetes medicines and is directed to discuss this with his primary physician immediately. He has some  persistent soreness across his anterior chest wall at the level of the xiphoid but otherwise breathing better with less exertional dyspnea and before he went in the hospital. He remains dependent on continuous oxygen at 2 L. He continues his BiPAP machine all night, every night but says he was sleeping better with it  on autotitration with a lower pressure used in the hospital.  09/25/11- COPD, CAD/diastolic dysfunction, OSA, chronic respiratory failure, obesity Hypoventilation, cor pulmonale Since last here he was hospitalized for respiratory failure with CHF and obesity hypoventilation. Better with diuresis. BiPAP 13/10 is now comfortable and used all night every night with supplemental oxygen 4 L. Tolerated colonoscopy without respiratory distress. Uses a rescue inhaler only occasionally but says it does help then.  11/23/11- COPD, CAD/diastolic dysfunction, OSA, chronic respiratory failure, obesity Hypoventilation, cor pulmonale She is noticing some increased shortness of breath on exertion such as getting in the car but no increase in ankle edema which is well controlled. Occasional cough is not progressive or productive. Noticing more rhinorrhea with the pollen. Continues BiPAP  I 13/E 10 all night every night.  02/07/12- COPD, CAD/diastolic dysfunction, OSA, chronic respiratory failure, obesity Hypoventilation, cor pulmonale  pt states doing some better.wakes up out of a nap coughing feels like he's choking.  Post Hosp- 01/14/12- 01/23/12-discharge diagnoses reviewed with him: Coronary artery disease/CABG, diastolic dysfunction, COPD with chronic respiratory failure, acute respiratory failure with hypoxia, acute on chronic renal failure, DM. He was intubated for 4 days and treated with Zosyn, vancomycin and Avelox, Levaquin, Zithromax and Rocephin. Cultures negative. He was to wean off of prednisone over 3 days. He was hoarse after intubation but that  has resolved. He is using oxygen 1-2 L and BiPAP   13/10 at night. He feels he is pretty close to his baseline.  03/28/12-  COPD, CAD/diastolic dysfunction/ chronic CHF, OSA, chronic respiratory failure, obesity Hypoventilation, cor pulmonale  6 MWT today. Pt states breathing has been "okay" since last OV. Pt states having trouble falling alseep-not sleeping well on BiPAP. Pt states mask fit well but thinks the pressure needs to be adjusted again. Currently 13/10+ oxygen at 2 L/ Advanced. He sleeps better in a recliner with oxygen but without his BiPAP. Getting iron injections for anemia. We discussed shortness of breath and anemia. Pain left anterior axillary line described as a sore ache over the past week. He is able to raise his arm. Thinks he maybe lifted something wrong. COPD assessment test (CAT) 21/40 CXR 01/14/12- 1 view- IMPRESSION:  Stable support apparatus. Residual mild interstitial prominence  with slight improvement in aeration. No convincing pulmonary  edema. Probable small left pleural effusion with left basilar  atelectasis or infiltrate.  Original Report Authenticated By: [name], M.D.   3/54/[birth year]-KCLE, CAD/diastolic dysfunction/ chronic CHF, OSA, chronic respiratory failure, obesity Hypoventilation, cor pulmonale Hospitalized again between March 9 and 12 for acute on chronic renal failure with congestive heart failure, chronic respiratory failure on BiPAP and acute respiratory failure with hypoxia. He says he is feeling better at this time. Continues oxygen 3 L/Advanced. He has been using BiPAP 13/10 but thinks he slept better with CPAP. We discussed options. Usually when he decompensates it is from fluid overload triggering CO2 retention and hypoventilation.  CXR 10/20/12 IMPRESSION:  Vascular congestion and cardiomegaly, with small bilateral pleural  effusions and bibasilar airspace opacification. Findings are most  compatible with recurrent pulmonary edema, though underlying  pneumonia cannot be excluded.  Original Report  Authenticated By: [name], M.D.  12/02/2012 [facility] follow up  Returns for a post hospital follow up .  Reports doing well overall but still having some SOB, thinks this may be due to his pressure not being high enough on BIPAP. Since decreasing BIPAP 1 month ago  down to 12/10 he does not feel as good. Trouble sleeping.  Was admitted for decompensated cor pulmonale w/ fluid overload with subsequent acute on chronic hypercarbic resp failure.  He admits he can not afford his advair and spiriva- therefore he is not taking.  He does not wear BIPAP everynight. We discussed the implications of this and untreated OHS/OSA. He naps a lot but does not wear BIPAP.  He was treated with diuresis with improvement.  Discharged on Lasix 40mg  . Lower dose due to bump in scr. Has OP follow up with Renal d/t renal insufficiency.   02/05/13- COPD, CAD/diastolic dysfunction/ chronic CHF, OSA, chronic respiratory failure, obesity Hypoventilation, cor pulmonale Hosp 6/16-6/19/14 Acute on Chronic Resp Failure Discharge Plan:  -pt educated on BiPAP compliance at home  -continue pulmicort, scheduled albuterol / atrovent (med cost has been an issue)  -wean prednisone to off  -given information for [employer] at [employer]. Pulmonary Rehab was a cost issue (45$ per visit)  -RN home health to review medications and reinforce need for BiPAP 12/12 wO2 2-3L for sleep, O2 3-4 awake.  FOLLOWS FOR: Breathing has improved since leaving the hospital. Reports DOE, chest tightness with exertion and slight coughing from time to time. Wife needs FMLA form. CXR 01/29/13 1View IMPRESSION:  Slightly improved aeration in the lungs may represent decreasing  edema.  Original Report Authenticated By: Markus Daft, M.  04/11/13- COPD, CAD/diastolic dysfunction/ chronic CHF, OSA, chronic respiratory failure, obesity Hypoventilation, cor pulmonale FOLLOWS FOR: reports breathing is essentially unchanged but he feels like he's  "burning through more oxygen than I used to." denies wheezing, increased SOB, tightness, increased cough, f/c/s Just in hospital again acute on chronic respiratory failure with CHF and COPD. Today he feels well except for frontal headache. Continuing oxygen at 2.5 L. BIPAP Auto 5-15/ O2 2L/ Advanced CXR 1V 8/14 IMPRESSION:  1. Extubated, enteric tube removed. Stable lung volumes.  2. Stable ventilation. Small pleural effusions.  Electronically Signed  By: Lars Pinks  On: 04/02/2013 07:43  06/23/2013 [facility] follow up  Patient presents for a post hospital followup. Patient was admitted October 7 through October 30 for acute on chronic respiratory failure. Patient did require intubation for airway protection in the emergency room due to acute encephalopathy. Unfortunately, patient was unable to be weaned from the ventilator and underwent a tracheostomy. Patient was weaned to trach collar during the daytime and vent support. At bedtime. Lurline Idol was placed by ENT Dr. Redmond Baseman. He was transitioned to an extra long #6 trach prior to discharge.  Patient returns today accompanied by his wife. Patient reports that he has been doing okay. He strength. He is tolerating vent support at night. However, he does feel that this is uncomfortable in certain times. Vent settings at night are PRVC at a rate of 14 with a tidal volume of 450 cc with FiO2 of 40%, and a PEEP of 5. Patient has been using Passy-Muir valve for speech however, over the last couple days, feels it's been more difficulties than his usual. He does have follow with ENT later this week for trach evaluation. Patient denies any hemoptysis, trach site. Bleeding, Orthopnea, PND, or increased leg swelling. Has been eating okay without any noted , dysphagia. Appetite is decreased with no nausea, vomiting, diarrhea.  REC Continue on current regimen.  follow up with ENT this week for [facility] evaluation.  I will call with labs and xray results.    Continue on Trach Collar during daytime  Continue on Vent support At bedtime   Follow up Dr. Chase Caller in 6-8 weeks and As needed .  Please contact office for sooner follow up if symptoms do not improve or worsen or seek emergency care   Late Add : ? Right sided infiltrate  Add Levaquin 500mg  daily x 7 days #7 , no refills.    OV 07/21/2013 Followup chronic respiratory failure status post tracheostomy. Since seeing my nurse practitioner a month ago he is doing well. Uses ventilator at night and daytime is on tracheostomy collar. He is not compliant with his Passy-Muir valve because it gives him a sensation of suffocation. But he is able to talk with open tracheostomy. He still reports chronic stable dyspnea on exertion for walking a block or sometimes half a block only. This is relieved by rest. Rated as moderate. There is no associated chest pain no worsening cough or phlegm. He did have some hemoptysis yesterday and he is trying to get in contact with the ENT specialist. As noted just beside bleeding. He denies any orthopnea or paroxysmal nocturnal dyspnea. Eating okay.  Lab review I do note that is not had a CT scan of the chest since 2012 rec Glad you are doing well CMA will refer you to pumonary rehab on basis of chronic resp failure Please do CT scan chest for pleural fibrosis, pleural plaques Followup with ENT> Dr Redmond Baseman, changed trach  on 12/15  07/31/2013 f/u ov/Wert re: worse since 07/24/13 (day of CT Chest - see below) Chief Complaint  Patient presents with  . Acute Visit    Pt c/o increased SOB for the past several days. He states gets out of breath with any sort of exertion.  He has minimal cough that is non prod.   mucus is clear/ not bloody Baseline use of alb not much at all,  One every few days at most Assoc with increased fluid in legs  But followed by Gwenlyn Found who adjusts his lasix  No obvious patterns in day to day or daytime variabilty or assoc chronic cough or cp or  chest tightness, subjective wheeze overt sinus or hb symptoms. No unusual exp hx or h/o childhood pna/ asthma or knowledge of premature birth.  Sleeping ok without nocturnal  or early am exacerbation  of respiratory  c/o's or need for noct saba. Also denies any obvious fluctuation of symptoms with weather or environmental changes or other aggravating or alleviating factors except as outlined above    09/15/2013 Follow up  Returns for a 2 month follow up for COPD, chronic resp failure -trach dependent.  Reports breathing has worsened x2 weeks w/ increased SOB, chest tightness, cough with white mucus. Coughing and congestion make it difficult to use vent at night . Denies any f/c/s, nausea, vomiting, hemoptysis, edema.  Was referred to pulmonary rehab however could not afford co-pay amounts. We discussed looking into pt assistance.    OV 10/28/2013  Chief Complaint  Patient presents with  . Follow-up    Pt is c/o rattling in his chest, and dry cough. Pt also c/o increased swelling in both ankles.     Followup chronic respiratory failure status post tracheostomy by ENT. Chronic respiratory failure on account of COPD, OSA, diastolic heart failure  - Overall he is stable. He reports using the ventilator at night but occasionally he is noncompliant. When he does not use the ventilator the next day he is more fatigued according to his wife. In the daytime he is on trach collar on room air of liters of oxygen. He occasionally uses his Passy-Muir valve for talk but otherwise feels a sense of suffocation. The last few weeks he's noticed some increased pedal edema and despite increasing and self titrating his Lasix. In association with this he is more rattling in his chest and more shortness of breath and cough but there is no change in sputum color. The sputum is not pink and frothy. He has not seen his cardiologist Dr.? Dr Adora Fridge a while and he plans to see him more expeditiously. Denies any chest pain  cough fever chills or wheezing.  Copd appears stable However, diastolic chf might be worse My office will help you seen by Dr Gwenlyn Found office next day or two  REturn to see me in 4 months or sooner if needed  - continue trach care through ENT clinic   OV 02/24/2014  Chief Complaint  Patient presents with  . Follow-up    Pt c/o increase SOB, cough with clear mucous, intermittent edema in BIL ankles. C/o upper mid sternal CP and left upper CP when on ventilator.    Followup chronic respiratory failure on account of obesity, sleep apnea, COPD and diastolic heart failure  - He continues to feel poorly. For the last month or so he produces increased shortness of breath associated with worsening pedal edema. Is only on Lasix 40 mg once daily. He says he  is compliant with his nebulizers. Although, he is noncompliant with his ventilator stating that the pressures are too high. He probably uses it less than 50% of the time. In addition he is very keen on getting his tracheostomy removed permanently this is despite our advice that he keep it in permanently. He is frustrated by his quality of life. I've explained to him that weight loss is paramount but he is not too keen on this. He reports that his cough, sputum or in sputum color or roughly the same as baseline without any fever >>pred taper , increased lasix 40mg  Twice daily    03/10/14 Follow up COPD/OHS/ Trach Depend RF /Noct Vent Returns for follow up for COPD . Seen last ov with COPD flare and suspected decompensated Diastolic CHF. He was treated with prednisone taper and diuretics were increased.  He says he is some better but gets winded easily.  Encouraged to use vent at night .  Leg swelling slightly better. Labs last week showed scr stable.  No chest pain , hemoptyiss, fever or n/vd/     O 05/12/2014  Chief Complaint  Patient presents with  . Follow-up    Pt states his breathing has slightly worsened since last OV. Pt states he has a  slight increase in SOB, non prod cough and chest pain when coming off the vent in the morning.    Followup chronic respiratory failure on account of obesity, sleep apnea, COPD and diastolic heart failure and Trach Depend RF /Noct Vent   - presents with wife. Continues to report frustration having trach in life; more resigned to having it. He is frustrated by class 3 exertional fatigue and dyspnea. REcently hgb 6.3gm% at renal; no obvious bleed and renal managing it but dyspnea is chronic. Noc hange in baseline cough and white sputum of moderate intensity. Continues vent at night; more compliant.  Uptodate with flu shot. He has cards appt pending in  <  2 months per hx   12/10/2014 [facility] follow up  Patient presents for a post hospital follow-up Patient was admitted April 5 through April 7 for pneumonia and acute on chronic hypercarbic respiratory failure Influenza panel was negative. He was treated with IV antibiotics, steroids, and nebulized bronchodilators. CT chest showed a right middle lobe pneumonia. Patient remains on trach collar 4.5L  during the daytime with vent support at night. He says since discharge he is feeling some better. He still continues to feel weak. He denies any hemoptysis, orthopnea, PND or leg swelling Seen by ENT with trach change  Last week.  Remains on Pulmicort and brovana nebs.  Wt is down from discharge (~6 lbs ) , decreased leg swelling.  Does tell me he turns up O2 to 6-7 L when he feels sob even though O2 sats >90%.  . We discussed O2 sats gaol of 90-92%.     Review of Systems  Constitutional:  Negative for fever and unexpected weight change.  HENT: Negative for congestion, dental problem, ear pain, nosebleeds, postnasal drip, rhinorrhea, sinus pressure, sneezing, sore throat and trouble swallowing.   Eyes: Negative for redness and itching.  Respiratory: Positive for cough and shortness of breath. Negative for chest tightness and wheezing.    Cardiovascular: . Negative for palpitations and leg swelling.  Gastrointestinal:. Negative for nausea and vomiting.  Genitourinary: Negative for dysuria.  Musculoskeletal: Negative for joint swelling.  Skin: Negative for rash.  Neurological: Negative for headaches.  Hematological: Does not bruise/bleed easily.  Psychiatric/Behavioral: Negative  for dysphoric mood. The patient is not nervous/anxious.        Objective:   Physical Exam     HENT:  Head: Normocephalic and atraumatic.  Right Ear: External ear normal.  Left Ear: External ear normal.  Mouth/Throat: Oropharynx is clear and moist. No oropharyngeal exudate.  Trach +. Speaking through [place]  Eyes: Conjunctivae and EOM are normal. Pupils are equal, round, and reactive to light. Right eye exhibits no discharge. Left eye exhibits no discharge. No scleral icterus.  Neck: Normal range of motion. Neck supple. No JVD present. No tracheal deviation present. No thyromegaly present.  Cardiovascular: Normal rate, regular rhythm and intact distal pulses.  Exam reveals no gallop and no friction rub.   No murmur heard. Pulmonary/Chest: Effort normal and breath sounds normal. No respiratory distress. He has no wheezes. He has no rales. He exhibits no tenderness.  Abdominal: Soft. Bowel sounds are normal. He exhibits no distension and no mass. There is no tenderness. There is no rebound and no guarding.  Musculoskeletal: Normal range of motion. He exhibits edema. He exhibits no tenderness.  + edema  Lymphadenopathy:    He has no cervical adenopathy.  Neurological: He is alert and oriented to person, place, and time. He has normal reflexes. No cranial nerve deficit. Coordination normal.  Skin: Skin is warm and dry. No rash noted. He is not diaphoretic. No erythema. No pallor.  Psychiatric: He has a normal mood an   CXR 12/10/2014  Tracheostomy tube noted in good anatomic position Persistent bibasilar pulmonary alveolar infiltrates again  noted. These changes could be related to bilateral pneumonia and/or pulmonary edema. No interim improvement.  2. Stable cardiomegaly. Persistent mild pulmonary vascular prominence.    Assessment & Plan:

## 2014-12-11 ENCOUNTER — Encounter (HOSPITAL_COMMUNITY)
Admission: RE | Admit: 2014-12-11 | Discharge: 2014-12-11 | Disposition: A | Discharge status: Home / Self Care | Payer: Medicare Other | Source: Ambulatory Visit | Attending: Nephrology | Admitting: Nephrology

## 2014-12-11 DIAGNOSIS — G4733 Obstructive sleep apnea (adult) (pediatric): Secondary | ICD-10-CM | POA: Diagnosis not present

## 2014-12-11 DIAGNOSIS — J449 Chronic obstructive pulmonary disease, unspecified: Secondary | ICD-10-CM | POA: Diagnosis not present

## 2014-12-11 DIAGNOSIS — D631 Anemia in chronic kidney disease: Secondary | ICD-10-CM | POA: Diagnosis not present

## 2014-12-11 DIAGNOSIS — N183 Chronic kidney disease, stage 3 (moderate): Secondary | ICD-10-CM | POA: Diagnosis not present

## 2014-12-11 DIAGNOSIS — E662 Morbid (severe) obesity with alveolar hypoventilation: Secondary | ICD-10-CM | POA: Diagnosis not present

## 2014-12-11 DIAGNOSIS — J9690 Respiratory failure, unspecified, unspecified whether with hypoxia or hypercapnia: Secondary | ICD-10-CM | POA: Diagnosis not present

## 2014-12-11 MED ORDER — DARBEPOETIN ALFA 100 MCG/0.5ML IJ SOSY
PREFILLED_SYRINGE | INTRAMUSCULAR | Status: AC
  Filled 2014-12-11: qty 0.5

## 2014-12-11 MED ORDER — DARBEPOETIN ALFA 100 MCG/0.5ML IJ SOSY
100.0000 ug | PREFILLED_SYRINGE | INTRAMUSCULAR | Status: DC
  Administered 2014-12-11: 100 ug via SUBCUTANEOUS

## 2014-12-11 NOTE — Addendum Note (Signed)
Addended by: Parke Poisson E on: 12/11/2014 09:51 AM   Modules accepted: Orders

## 2014-12-12 DIAGNOSIS — J9610 Chronic respiratory failure, unspecified whether with hypoxia or hypercapnia: Secondary | ICD-10-CM | POA: Diagnosis not present

## 2014-12-14 LAB — POCT HEMOGLOBIN-HEMACUE: Hemoglobin: 7.4 g/dL — ABNORMAL LOW (ref 13.0–17.0)

## 2014-12-18 DIAGNOSIS — Z930 Tracheostomy status: Secondary | ICD-10-CM | POA: Diagnosis not present

## 2014-12-18 DIAGNOSIS — Z7901 Long term (current) use of anticoagulants: Secondary | ICD-10-CM | POA: Diagnosis not present

## 2014-12-18 DIAGNOSIS — I2510 Atherosclerotic heart disease of native coronary artery without angina pectoris: Secondary | ICD-10-CM | POA: Diagnosis not present

## 2014-12-18 DIAGNOSIS — N183 Chronic kidney disease, stage 3 (moderate): Secondary | ICD-10-CM | POA: Diagnosis not present

## 2014-12-18 DIAGNOSIS — J449 Chronic obstructive pulmonary disease, unspecified: Secondary | ICD-10-CM | POA: Diagnosis not present

## 2014-12-18 DIAGNOSIS — I5031 Acute diastolic (congestive) heart failure: Secondary | ICD-10-CM | POA: Diagnosis not present

## 2014-12-18 DIAGNOSIS — E119 Type 2 diabetes mellitus without complications: Secondary | ICD-10-CM | POA: Diagnosis not present

## 2014-12-18 DIAGNOSIS — Z9981 Dependence on supplemental oxygen: Secondary | ICD-10-CM | POA: Diagnosis not present

## 2014-12-22 DIAGNOSIS — N179 Acute kidney failure, unspecified: Secondary | ICD-10-CM | POA: Diagnosis not present

## 2014-12-22 DIAGNOSIS — I129 Hypertensive chronic kidney disease with stage 1 through stage 4 chronic kidney disease, or unspecified chronic kidney disease: Secondary | ICD-10-CM | POA: Diagnosis not present

## 2014-12-22 DIAGNOSIS — N189 Chronic kidney disease, unspecified: Secondary | ICD-10-CM | POA: Diagnosis not present

## 2014-12-22 DIAGNOSIS — N183 Chronic kidney disease, stage 3 (moderate): Secondary | ICD-10-CM | POA: Diagnosis not present

## 2014-12-22 DIAGNOSIS — E118 Type 2 diabetes mellitus with unspecified complications: Secondary | ICD-10-CM | POA: Diagnosis not present

## 2014-12-24 DIAGNOSIS — J449 Chronic obstructive pulmonary disease, unspecified: Secondary | ICD-10-CM | POA: Diagnosis not present

## 2014-12-24 DIAGNOSIS — G4733 Obstructive sleep apnea (adult) (pediatric): Secondary | ICD-10-CM | POA: Diagnosis not present

## 2014-12-24 DIAGNOSIS — Z930 Tracheostomy status: Secondary | ICD-10-CM | POA: Diagnosis not present

## 2014-12-24 DIAGNOSIS — E662 Morbid (severe) obesity with alveolar hypoventilation: Secondary | ICD-10-CM | POA: Diagnosis not present

## 2014-12-25 ENCOUNTER — Encounter (HOSPITAL_COMMUNITY)
Admission: RE | Admit: 2014-12-25 | Discharge: 2014-12-25 | Disposition: A | Discharge status: Home / Self Care | Payer: Medicare Other | Source: Ambulatory Visit | Attending: Nephrology | Admitting: Nephrology

## 2014-12-25 DIAGNOSIS — I5031 Acute diastolic (congestive) heart failure: Secondary | ICD-10-CM | POA: Diagnosis not present

## 2014-12-25 DIAGNOSIS — Z7901 Long term (current) use of anticoagulants: Secondary | ICD-10-CM | POA: Diagnosis not present

## 2014-12-25 DIAGNOSIS — N183 Chronic kidney disease, stage 3 (moderate): Secondary | ICD-10-CM | POA: Diagnosis not present

## 2014-12-25 DIAGNOSIS — I2510 Atherosclerotic heart disease of native coronary artery without angina pectoris: Secondary | ICD-10-CM | POA: Diagnosis not present

## 2014-12-25 DIAGNOSIS — Z930 Tracheostomy status: Secondary | ICD-10-CM | POA: Diagnosis not present

## 2014-12-25 DIAGNOSIS — D631 Anemia in chronic kidney disease: Secondary | ICD-10-CM | POA: Insufficient documentation

## 2014-12-25 DIAGNOSIS — J449 Chronic obstructive pulmonary disease, unspecified: Secondary | ICD-10-CM | POA: Diagnosis not present

## 2014-12-25 DIAGNOSIS — Z9981 Dependence on supplemental oxygen: Secondary | ICD-10-CM | POA: Diagnosis not present

## 2014-12-25 DIAGNOSIS — E119 Type 2 diabetes mellitus without complications: Secondary | ICD-10-CM | POA: Diagnosis not present

## 2014-12-25 LAB — RENAL FUNCTION PANEL
Albumin: 3 g/dL — ABNORMAL LOW (ref 3.5–5.0)
Anion gap: 14 (ref 5–15)
BUN: 60 mg/dL — AB (ref 6–20)
CO2: 38 mmol/L — ABNORMAL HIGH (ref 22–32)
Calcium: 8.5 mg/dL — ABNORMAL LOW (ref 8.9–10.3)
Chloride: 84 mmol/L — ABNORMAL LOW (ref 101–111)
Creatinine, Ser: 2.69 mg/dL — ABNORMAL HIGH (ref 0.61–1.24)
GFR calc Af Amer: 28 mL/min — ABNORMAL LOW (ref >60)
GFR calc non Af Amer: 24 mL/min — ABNORMAL LOW (ref >60)
GLUCOSE: 221 mg/dL — AB (ref 65–99)
Phosphorus: 3.7 mg/dL (ref 2.5–4.6)
Potassium: 3.6 mmol/L (ref 3.5–5.1)
Sodium: 136 mmol/L (ref 135–145)

## 2014-12-25 LAB — IRON AND TIBC
Iron: 18 ug/dL — ABNORMAL LOW (ref 45–182)
SATURATION RATIOS: 7 % — AB (ref 17.9–39.5)
TIBC: 274 ug/dL (ref 250–450)
UIBC: 256 ug/dL

## 2014-12-25 LAB — POCT HEMOGLOBIN-HEMACUE: Hemoglobin: 6.4 g/dL — CL (ref 13.0–17.0)

## 2014-12-25 LAB — PREPARE RBC (CROSSMATCH)

## 2014-12-25 LAB — FERRITIN: FERRITIN: 339 ng/mL — AB (ref 24–336)

## 2014-12-25 MED ORDER — DARBEPOETIN ALFA 100 MCG/0.5ML IJ SOSY
100.0000 ug | PREFILLED_SYRINGE | INTRAMUSCULAR | Status: DC
  Administered 2014-12-25: 100 ug via SUBCUTANEOUS

## 2014-12-25 MED ORDER — DARBEPOETIN ALFA 100 MCG/0.5ML IJ SOSY
PREFILLED_SYRINGE | INTRAMUSCULAR | Status: AC
  Administered 2014-12-25: 100 ug via SUBCUTANEOUS
  Filled 2014-12-25: qty 0.5

## 2014-12-25 NOTE — Progress Notes (Signed)
hemocue today 6.4.  Pt denies CP, states he does not feel any different than normal, and denies seeing any blood loss.  Reported all of the above to Alinda Sierras at Dr [facility] office and orders received to increase the patient to weekly.

## 2014-12-28 ENCOUNTER — Encounter (HOSPITAL_COMMUNITY)
Admission: RE | Admit: 2014-12-28 | Discharge: 2014-12-28 | Disposition: A | Discharge status: Home / Self Care | Payer: Medicare Other | Source: Ambulatory Visit | Attending: Nephrology | Admitting: Nephrology

## 2014-12-28 DIAGNOSIS — N183 Chronic kidney disease, stage 3 (moderate): Secondary | ICD-10-CM | POA: Diagnosis not present

## 2014-12-28 DIAGNOSIS — D631 Anemia in chronic kidney disease: Secondary | ICD-10-CM | POA: Diagnosis not present

## 2014-12-28 MED ORDER — ACETAMINOPHEN 325 MG PO TABS
ORAL_TABLET | ORAL | Status: AC
  Administered 2014-12-28: 325 mg via ORAL
  Filled 2014-12-28: qty 1

## 2014-12-28 MED ORDER — SODIUM CHLORIDE 0.9 % IV SOLN: Freq: Once | INTRAVENOUS | Status: DC

## 2014-12-28 MED ORDER — FUROSEMIDE 10 MG/ML IJ SOLN
40.0000 mg | Freq: Once | INTRAMUSCULAR | Status: AC
  Administered 2014-12-28: 40 mg via INTRAVENOUS

## 2014-12-28 MED ORDER — FUROSEMIDE 10 MG/ML IJ SOLN
INTRAMUSCULAR | Status: AC
  Filled 2014-12-28: qty 4

## 2014-12-28 MED ORDER — DIPHENHYDRAMINE HCL 25 MG PO CAPS
25.0000 mg | ORAL_CAPSULE | Freq: Once | ORAL | Status: AC
  Administered 2014-12-28: 25 mg via ORAL

## 2014-12-28 MED ORDER — DIPHENHYDRAMINE HCL 25 MG PO CAPS
ORAL_CAPSULE | ORAL | Status: AC
Start: 2014-12-28 — End: 2014-12-28
  Administered 2014-12-28: 25 mg via ORAL
  Filled 2014-12-28: qty 1

## 2014-12-28 MED ORDER — ACETAMINOPHEN 325 MG PO TABS
650.0000 mg | ORAL_TABLET | Freq: Once | ORAL | Status: AC
  Administered 2014-12-28: 325 mg via ORAL

## 2014-12-29 LAB — TYPE AND SCREEN
ABO/RH(D): O POS
Antibody Screen: NEGATIVE
UNIT DIVISION: 0
Unit division: 0

## 2014-12-30 ENCOUNTER — Other Ambulatory Visit (HOSPITAL_COMMUNITY): Payer: Self-pay | Admitting: *Deleted

## 2014-12-31 ENCOUNTER — Encounter (HOSPITAL_COMMUNITY)
Admission: RE | Admit: 2014-12-31 | Discharge: 2014-12-31 | Disposition: A | Discharge status: Home / Self Care | Payer: Medicare Other | Source: Ambulatory Visit | Attending: Nephrology | Admitting: Nephrology

## 2014-12-31 DIAGNOSIS — D631 Anemia in chronic kidney disease: Secondary | ICD-10-CM | POA: Diagnosis not present

## 2014-12-31 DIAGNOSIS — N183 Chronic kidney disease, stage 3 (moderate): Secondary | ICD-10-CM | POA: Diagnosis not present

## 2014-12-31 DIAGNOSIS — I2510 Atherosclerotic heart disease of native coronary artery without angina pectoris: Secondary | ICD-10-CM | POA: Diagnosis not present

## 2014-12-31 DIAGNOSIS — Z7901 Long term (current) use of anticoagulants: Secondary | ICD-10-CM | POA: Diagnosis not present

## 2014-12-31 DIAGNOSIS — Z930 Tracheostomy status: Secondary | ICD-10-CM | POA: Diagnosis not present

## 2014-12-31 DIAGNOSIS — J449 Chronic obstructive pulmonary disease, unspecified: Secondary | ICD-10-CM | POA: Diagnosis not present

## 2014-12-31 DIAGNOSIS — E119 Type 2 diabetes mellitus without complications: Secondary | ICD-10-CM | POA: Diagnosis not present

## 2014-12-31 DIAGNOSIS — Z9981 Dependence on supplemental oxygen: Secondary | ICD-10-CM | POA: Diagnosis not present

## 2014-12-31 DIAGNOSIS — I5031 Acute diastolic (congestive) heart failure: Secondary | ICD-10-CM | POA: Diagnosis not present

## 2014-12-31 MED ORDER — DARBEPOETIN ALFA 200 MCG/0.4ML IJ SOSY
PREFILLED_SYRINGE | INTRAMUSCULAR | Status: AC
  Filled 2014-12-31: qty 0.4

## 2014-12-31 MED ORDER — DARBEPOETIN ALFA 200 MCG/0.4ML IJ SOSY
200.0000 ug | PREFILLED_SYRINGE | INTRAMUSCULAR | Status: DC
  Administered 2014-12-31: 200 ug via SUBCUTANEOUS

## 2015-01-01 LAB — POCT HEMOGLOBIN-HEMACUE: Hemoglobin: 7.8 g/dL — ABNORMAL LOW (ref 13.0–17.0)

## 2015-01-04 DIAGNOSIS — R0602 Shortness of breath: Secondary | ICD-10-CM | POA: Diagnosis not present

## 2015-01-06 ENCOUNTER — Telehealth: Payer: Self-pay | Admitting: *Deleted

## 2015-01-06 ENCOUNTER — Ambulatory Visit (INDEPENDENT_AMBULATORY_CARE_PROVIDER_SITE_OTHER)
Admission: RE | Admit: 2015-01-06 | Discharge: 2015-01-06 | Disposition: A | Discharge status: Home / Self Care | Payer: Medicare Other | Source: Ambulatory Visit | Attending: Internal Medicine | Admitting: Internal Medicine

## 2015-01-06 ENCOUNTER — Ambulatory Visit (INDEPENDENT_AMBULATORY_CARE_PROVIDER_SITE_OTHER): Payer: Medicare Other | Admitting: Internal Medicine

## 2015-01-06 ENCOUNTER — Encounter: Payer: Self-pay | Admitting: Internal Medicine

## 2015-01-06 ENCOUNTER — Other Ambulatory Visit (INDEPENDENT_AMBULATORY_CARE_PROVIDER_SITE_OTHER): Payer: Medicare Other

## 2015-01-06 VITALS — BP 102/58 | HR 108 | Ht 73.0 in | Wt 297.0 lb

## 2015-01-06 DIAGNOSIS — I5032 Chronic diastolic (congestive) heart failure: Secondary | ICD-10-CM

## 2015-01-06 DIAGNOSIS — R05 Cough: Secondary | ICD-10-CM | POA: Diagnosis not present

## 2015-01-06 DIAGNOSIS — J151 Pneumonia due to Pseudomonas: Secondary | ICD-10-CM

## 2015-01-06 DIAGNOSIS — J9610 Chronic respiratory failure, unspecified whether with hypoxia or hypercapnia: Secondary | ICD-10-CM | POA: Diagnosis not present

## 2015-01-06 DIAGNOSIS — Z930 Tracheostomy status: Secondary | ICD-10-CM

## 2015-01-06 DIAGNOSIS — J449 Chronic obstructive pulmonary disease, unspecified: Secondary | ICD-10-CM | POA: Diagnosis not present

## 2015-01-06 LAB — CBC WITH DIFFERENTIAL/PLATELET
BASOS ABS: 0 10*3/uL (ref 0.0–0.1)
Basophils Relative: 0.3 % (ref 0.0–3.0)
EOS ABS: 0.1 10*3/uL (ref 0.0–0.7)
Eosinophils Relative: 0.7 % (ref 0.0–5.0)
HEMATOCRIT: 25 % — AB (ref 39.0–52.0)
Hemoglobin: 7.9 g/dL — CL (ref 13.0–17.0)
LYMPHS ABS: 0.6 10*3/uL — AB (ref 0.7–4.0)
Lymphocytes Relative: 5.7 % — ABNORMAL LOW (ref 12.0–46.0)
MCHC: 31.5 g/dL (ref 30.0–36.0)
MCV: 82.7 fl (ref 78.0–100.0)
MONO ABS: 1.5 10*3/uL — AB (ref 0.1–1.0)
Monocytes Relative: 13.3 % — ABNORMAL HIGH (ref 3.0–12.0)
NEUTROS ABS: 8.9 10*3/uL — AB (ref 1.4–7.7)
Neutrophils Relative %: 80 % — ABNORMAL HIGH (ref 43.0–77.0)
PLATELETS: 385 10*3/uL (ref 150.0–400.0)
RBC: 3.02 Mil/uL — ABNORMAL LOW (ref 4.22–5.81)
RDW: 19 % — ABNORMAL HIGH (ref 11.5–15.5)
WBC: 11.1 10*3/uL — ABNORMAL HIGH (ref 4.0–10.5)

## 2015-01-06 LAB — BASIC METABOLIC PANEL
BUN: 68 mg/dL — ABNORMAL HIGH (ref 6–23)
CHLORIDE: 84 meq/L — AB (ref 96–112)
CO2: 48 meq/L — AB (ref 19–32)
Calcium: 9.5 mg/dL (ref 8.4–10.5)
Creatinine, Ser: 2.89 mg/dL — ABNORMAL HIGH (ref 0.40–1.50)
GFR: 28.54 mL/min — ABNORMAL LOW (ref >60.00)
GLUCOSE: 193 mg/dL — AB (ref 70–99)
Potassium: 4.4 mEq/L (ref 3.5–5.1)
SODIUM: 136 meq/L (ref 135–145)

## 2015-01-06 MED ORDER — CIPROFLOXACIN HCL 750 MG PO TABS: 750.0000 mg | ORAL_TABLET | Freq: Two times a day (BID) | ORAL | Status: DC

## 2015-01-06 NOTE — Telephone Encounter (Signed)
  Anemic but hgb stable Has CRI - stable CXR - worse since end April 2016 - chf or pneumonia or both - awaiting cultures = follow office plan  PULMONARY No results for input(s): PHART, PCO2ART, PO2ART, HCO3, TCO2, O2SAT in the last 168 hours.  Invalid input(s): PCO2, PO2  CBC  Recent Labs Lab 12/31/14 1417 01/06/15 1556  HGB 7.8* 7.9*  HCT  --  25.0*  WBC  --  11.1*  PLT  --  385.0    COAGULATION No results for input(s): INR in the last 168 hours.  CARDIAC  No results for input(s): TROPONINI in the last 168 hours. No results for input(s): PROBNP in the last 168 hours.   CHEMISTRY  Recent Labs Lab 01/06/15 1556  NA 136  K 4.4  CL 84*  CO2 48*  GLUCOSE 193*  BUN 68*  CREATININE 2.89*  CALCIUM 9.5   Estimated Creatinine Clearance: 38.2 mL/min (by C-G formula based on Cr of 2.89).   LIVER No results for input(s): AST, ALT, ALKPHOS, BILITOT, PROT, ALBUMIN, INR in the last 168 hours.   INFECTIOUS No results for input(s): LATICACIDVEN, PROCALCITON in the last 168 hours.   ENDOCRINE CBG (last 3)  No results for input(s): GLUCAP in the last 72 hours.       IMAGING x48h  - image(s) personally visualized  -   highlighted in bold Dg Chest 2 View  01/06/2015   CLINICAL DATA:  Two weeks of cough congestion and shortness of breath, history of CHF, COPD, chronic respiratory failure, tracheostomy dependent ; history of previous tobacco use.  EXAM: CHEST  2 VIEW  COMPARISON:  PA and lateral chest x-ray of December 10, 2014  FINDINGS: The lungs are reasonably well inflated. The interstitial markings have become more conspicuous in the mid and lower lung zones. There are near confluent. The cardiac silhouette is enlarged. The pulmonary vascularity is mildly prominent centrally but is stable. There are 7 intact sternal wires from previous CABG. The tracheostomy appliance tip lies at just above the inferior margin of the clavicular heads. There is no pleural effusion or  pneumothorax.  IMPRESSION: Persistent bilateral interstitial and alveolar infiltrates more conspicuous than on the earlier study. The findings are most compatible with pneumonia superimposed upon chronic interstitial lung disease. There may be low-grade CHF as well.  Follow-up radiographs following anticipated therapy are recommended to assure clearing.   Electronically Signed   By: David  Martinique M.D.   On: 01/06/2015 16:16

## 2015-01-06 NOTE — Telephone Encounter (Signed)
[name] [name] with the lab called critical lab: 7.9 Hemoglobin 25.0 Hematocrit.

## 2015-01-06 NOTE — Progress Notes (Signed)
Subjective:    Patient ID: [name] [name], male    DOB: 1950/11/29, 64 y.o.   MRN: [id number]  HPI   HPI 01/23/11- COPD, CAD/diastolic dysfunction, OSA, chronic respiratory failure, obesity Hypoventilation, cor pulmonale Hosp since last here- once for "shakes"-they told him wasn't using CPAP right, once for epistaxis, once to place urinary stent, and has had a right ? renal biopsy. Told anemic.  Breathing stable, but can't lie flat- feels smothered even on O2.  Runs O2 2-3L/M usually. I gave permision to go to 4 during exertion. Notes "tiresome" feeling midchest, related to exertion/ climbing stairs and relieved by rest. Told to pace himself and not try to push through that.  Discussed need for O2. He doesn't sleep well with his BiPAP 15/12. Discussed sleep hygiene- discussed room temperature. He was more comfortable with autotitration in hosp and we discussed a trial of that at home.  Has restarted diuretic since leg edema has come back.   05/25/11-  COPD, CAD/diastolic dysfunction, OSA, chronic respiratory failure, obesity Hypoventilation, cor pulmonale Went to ER last week- short of breath. Air wasn't satisfying. Was started back on shot for anemia. Feels some better.  Reports sneezing, postnasal drainage and a sense of mucus in his upper chest. Denies fever, sore throat and doesn't think he has a cold.  06/28/11-  COPD, CAD/diastolic dysfunction, OSA, chronic respiratory failure, obesity Hypoventilation, cor pulmonale Recently hospitalized October 25-29, notes reviewed with him and x-ray images reviewed by me. He was hospitalized for hypercapnic respiratory failure with transient encephalopathy and renal insufficiency. Since discharge he has regained some ankle edema while off of furosemide. He has a nephrology appointment later this month. He is concerned that he was taken off his diabetes medicines and is directed to discuss this with his primary physician immediately. He has some  persistent soreness across his anterior chest wall at the level of the xiphoid but otherwise breathing better with less exertional dyspnea and before he went in the hospital. He remains dependent on continuous oxygen at 2 L. He continues his BiPAP machine all night, every night but says he was sleeping better with it  on autotitration with a lower pressure used in the hospital.  09/25/11- COPD, CAD/diastolic dysfunction, OSA, chronic respiratory failure, obesity Hypoventilation, cor pulmonale Since last here he was hospitalized for respiratory failure with CHF and obesity hypoventilation. Better with diuresis. BiPAP 13/10 is now comfortable and used all night every night with supplemental oxygen 4 L. Tolerated colonoscopy without respiratory distress. Uses a rescue inhaler only occasionally but says it does help then.  11/23/11- COPD, CAD/diastolic dysfunction, OSA, chronic respiratory failure, obesity Hypoventilation, cor pulmonale She is noticing some increased shortness of breath on exertion such as getting in the car but no increase in ankle edema which is well controlled. Occasional cough is not progressive or productive. Noticing more rhinorrhea with the pollen. Continues BiPAP  I 13/E 10 all night every night.  02/07/12- COPD, CAD/diastolic dysfunction, OSA, chronic respiratory failure, obesity Hypoventilation, cor pulmonale  pt states doing some better.wakes up out of a nap coughing feels like he's choking.  Post Hosp- 01/14/12- 01/23/12-discharge diagnoses reviewed with him: Coronary artery disease/CABG, diastolic dysfunction, COPD with chronic respiratory failure, acute respiratory failure with hypoxia, acute on chronic renal failure, DM. He was intubated for 4 days and treated with Zosyn, vancomycin and Avelox, Levaquin, Zithromax and Rocephin. Cultures negative. He was to wean off of prednisone over 3 days. He was hoarse after intubation but that has  resolved. He is using oxygen 1-2 L and BiPAP   13/10 at night. He feels he is pretty close to his baseline.  03/28/12-  COPD, CAD/diastolic dysfunction/ chronic CHF, OSA, chronic respiratory failure, obesity Hypoventilation, cor pulmonale  6 MWT today. Pt states breathing has been "okay" since last OV. Pt states having trouble falling alseep-not sleeping well on BiPAP. Pt states mask fit well but thinks the pressure needs to be adjusted again. Currently 13/10+ oxygen at 2 L/ Advanced. He sleeps better in a recliner with oxygen but without his BiPAP. Getting iron injections for anemia. We discussed shortness of breath and anemia. Pain left anterior axillary line described as a sore ache over the past week. He is able to raise his arm. Thinks he maybe lifted something wrong. COPD assessment test (CAT) 21/40 CXR 01/14/12- 1 view- IMPRESSION:  Stable support apparatus. Residual mild interstitial prominence  with slight improvement in aeration. No convincing pulmonary  edema. Probable small left pleural effusion with left basilar  atelectasis or infiltrate.  Original Report Authenticated By: [name], M.D.   2/44/01-[facility], CAD/diastolic dysfunction/ chronic CHF, OSA, chronic respiratory failure, obesity Hypoventilation, cor pulmonale Hospitalized again between March 9 and 12 for acute on chronic renal failure with congestive heart failure, chronic respiratory failure on BiPAP and acute respiratory failure with hypoxia. He says he is feeling better at this time. Continues oxygen 3 L/Advanced. He has been using BiPAP 13/10 but thinks he slept better with CPAP. We discussed options. Usually when he decompensates it is from fluid overload triggering CO2 retention and hypoventilation.  CXR 10/20/12 IMPRESSION:  Vascular congestion and cardiomegaly, with small bilateral pleural  effusions and bibasilar airspace opacification. Findings are most  compatible with recurrent pulmonary edema, though underlying  pneumonia cannot be excluded.  Original Report  Authenticated By: [name], M.D.  12/02/2012 [facility] follow up  Returns for a post hospital follow up .  Reports doing well overall but still having some SOB, thinks this may be due to his pressure not being high enough on BIPAP. Since decreasing BIPAP 1 month ago  down to 12/10 he does not feel as good. Trouble sleeping.  Was admitted for decompensated cor pulmonale w/ fluid overload with subsequent acute on chronic hypercarbic resp failure.  He admits he can not afford his advair and spiriva- therefore he is not taking.  He does not wear BIPAP everynight. We discussed the implications of this and untreated OHS/OSA. He naps a lot but does not wear BIPAP.  He was treated with diuresis with improvement.  Discharged on Lasix 40mg  . Lower dose due to bump in scr. Has OP follow up with Renal d/t renal insufficiency.   02/05/13- COPD, CAD/diastolic dysfunction/ chronic CHF, OSA, chronic respiratory failure, obesity Hypoventilation, cor pulmonale Hosp 6/16-6/19/14 Acute on Chronic Resp Failure Discharge Plan:  -pt educated on BiPAP compliance at home  -continue pulmicort, scheduled albuterol / atrovent (med cost has been an issue)  -wean prednisone to off  -given information for [employer] at [employer]. Pulmonary Rehab was a cost issue (45$ per visit)  -RN home health to review medications and reinforce need for BiPAP 12/12 wO2 2-3L for sleep, O2 3-4 awake.  FOLLOWS FOR: Breathing has improved since leaving the hospital. Reports DOE, chest tightness with exertion and slight coughing from time to time. Wife needs FMLA form. CXR 01/29/13 1View IMPRESSION:  Slightly improved aeration in the lungs may represent decreasing  edema.  Original Report Authenticated By: Markus Daft, M.  04/11/13- COPD, CAD/diastolic dysfunction/ chronic CHF, OSA, chronic respiratory failure, obesity Hypoventilation, cor pulmonale FOLLOWS FOR: reports breathing is essentially unchanged but he feels like he's  "burning through more oxygen than I used to." denies wheezing, increased SOB, tightness, increased cough, f/c/s Just in hospital again acute on chronic respiratory failure with CHF and COPD. Today he feels well except for frontal headache. Continuing oxygen at 2.5 L. BIPAP Auto 5-15/ O2 2L/ Advanced CXR 1V 8/14 IMPRESSION:  1. Extubated, enteric tube removed. Stable lung volumes.  2. Stable ventilation. Small pleural effusions.  Electronically Signed  By: Lars Pinks  On: 04/02/2013 07:43  06/23/2013 [facility] follow up  Patient presents for a post hospital followup. Patient was admitted October 7 through October 30 for acute on chronic respiratory failure. Patient did require intubation for airway protection in the emergency room due to acute encephalopathy. Unfortunately, patient was unable to be weaned from the ventilator and underwent a tracheostomy. Patient was weaned to trach collar during the daytime and vent support. At bedtime. Lurline Idol was placed by ENT Dr. Redmond Baseman. He was transitioned to an extra long #6 trach prior to discharge.  Patient returns today accompanied by his wife. Patient reports that he has been doing okay. He strength. He is tolerating vent support at night. However, he does feel that this is uncomfortable in certain times. Vent settings at night are PRVC at a rate of 14 with a tidal volume of 450 cc with FiO2 of 40%, and a PEEP of 5. Patient has been using Passy-Muir valve for speech however, over the last couple days, feels it's been more difficulties than his usual. He does have follow with ENT later this week for trach evaluation. Patient denies any hemoptysis, trach site. Bleeding, Orthopnea, PND, or increased leg swelling. Has been eating okay without any noted , dysphagia. Appetite is decreased with no nausea, vomiting, diarrhea.  REC Continue on current regimen.  follow up with ENT this week for [facility] evaluation.  I will call with labs and xray results.    Continue on Trach Collar during daytime  Continue on Vent support At bedtime   Follow up Dr. Chase Caller in 6-8 weeks and As needed .  Please contact office for sooner follow up if symptoms do not improve or worsen or seek emergency care   Late Add : ? Right sided infiltrate  Add Levaquin 500mg  daily x 7 days #7 , no refills.    OV 07/21/2013 Followup chronic respiratory failure status post tracheostomy. Since seeing my nurse practitioner a month ago he is doing well. Uses ventilator at night and daytime is on tracheostomy collar. He is not compliant with his Passy-Muir valve because it gives him a sensation of suffocation. But he is able to talk with open tracheostomy. He still reports chronic stable dyspnea on exertion for walking a block or sometimes half a block only. This is relieved by rest. Rated as moderate. There is no associated chest pain no worsening cough or phlegm. He did have some hemoptysis yesterday and he is trying to get in contact with the ENT specialist. As noted just beside bleeding. He denies any orthopnea or paroxysmal nocturnal dyspnea. Eating okay.  Lab review I do note that is not had a CT scan of the chest since 2012 rec Glad you are doing well CMA will refer you to pumonary rehab on basis of chronic resp failure Please do CT scan chest for pleural fibrosis, pleural plaques Followup with ENT> Dr Redmond Baseman, changed trach  on 12/15  07/31/2013 f/u ov/Wert re: worse since 07/24/13 (day of CT Chest - see below) Chief Complaint  Patient presents with  . Acute Visit    Pt c/o increased SOB for the past several days. He states gets out of breath with any sort of exertion.  He has minimal cough that is non prod.   mucus is clear/ not bloody Baseline use of alb not much at all,  One every few days at most Assoc with increased fluid in legs  But followed by Gwenlyn Found who adjusts his lasix  No obvious patterns in day to day or daytime variabilty or assoc chronic cough or cp or  chest tightness, subjective wheeze overt sinus or hb symptoms. No unusual exp hx or h/o childhood pna/ asthma or knowledge of premature birth.  Sleeping ok without nocturnal  or early am exacerbation  of respiratory  c/o's or need for noct saba. Also denies any obvious fluctuation of symptoms with weather or environmental changes or other aggravating or alleviating factors except as outlined above    09/15/2013 Follow up  Returns for a 2 month follow up for COPD, chronic resp failure -trach dependent.  Reports breathing has worsened x2 weeks w/ increased SOB, chest tightness, cough with white mucus. Coughing and congestion make it difficult to use vent at night . Denies any f/c/s, nausea, vomiting, hemoptysis, edema.  Was referred to pulmonary rehab however could not afford co-pay amounts. We discussed looking into pt assistance.    OV 10/28/2013  Chief Complaint  Patient presents with  . Follow-up    Pt is c/o rattling in his chest, and dry cough. Pt also c/o increased swelling in both ankles.     Followup chronic respiratory failure status post tracheostomy by ENT. Chronic respiratory failure on account of COPD, OSA, diastolic heart failure  - Overall he is stable. He reports using the ventilator at night but occasionally he is noncompliant. When he does not use the ventilator the next day he is more fatigued according to his wife. In the daytime he is on trach collar on room air of liters of oxygen. He occasionally uses his Passy-Muir valve for talk but otherwise feels a sense of suffocation. The last few weeks he's noticed some increased pedal edema and despite increasing and self titrating his Lasix. In association with this he is more rattling in his chest and more shortness of breath and cough but there is no change in sputum color. The sputum is not pink and frothy. He has not seen his cardiologist Dr.? Dr Adora Fridge a while and he plans to see him more expeditiously. Denies any chest pain  cough fever chills or wheezing.  Copd appears stable However, diastolic chf might be worse My office will help you seen by Dr Gwenlyn Found office next day or two  REturn to see me in 4 months or sooner if needed  - continue trach care through ENT clinic   OV 02/24/2014  Chief Complaint  Patient presents with  . Follow-up    Pt c/o increase SOB, cough with clear mucous, intermittent edema in BIL ankles. C/o upper mid sternal CP and left upper CP when on ventilator.    Followup chronic respiratory failure on account of obesity, sleep apnea, COPD and diastolic heart failure  - He continues to feel poorly. For the last month or so he produces increased shortness of breath associated with worsening pedal edema. Is only on Lasix 40 mg once daily. He says he  is compliant with his nebulizers. Although, he is noncompliant with his ventilator stating that the pressures are too high. He probably uses it less than 50% of the time. In addition he is very keen on getting his tracheostomy removed permanently this is despite our advice that he keep it in permanently. He is frustrated by his quality of life. I've explained to him that weight loss is paramount but he is not too keen on this. He reports that his cough, sputum or in sputum color or roughly the same as baseline without any fever >>pred taper , increased lasix 40mg  Twice daily    03/10/14 Follow up COPD/OHS/ Trach Depend RF /Noct Vent Returns for follow up for COPD . Seen last ov with COPD flare and suspected decompensated Diastolic CHF. He was treated with prednisone taper and diuretics were increased.  He says he is some better but gets winded easily.  Encouraged to use vent at night .  Leg swelling slightly better. Labs last week showed scr stable.  No chest pain , hemoptyiss, fever or n/vd/     O 05/12/2014  Chief Complaint  Patient presents with  . Follow-up    Pt states his breathing has slightly worsened since last OV. Pt states he has a  slight increase in SOB, non prod cough and chest pain when coming off the vent in the morning.    Followup chronic respiratory failure on account of obesity, sleep apnea, COPD and diastolic heart failure and Trach Depend RF /Noct Vent   - presents with wife. Continues to report frustration having trach in life; more resigned to having it. He is frustrated by class 3 exertional fatigue and dyspnea. REcently hgb 6.3gm% at renal; no obvious bleed and renal managing it but dyspnea is chronic. Noc hange in baseline cough and white sputum of moderate intensity. Continues vent at night; more compliant.  Uptodate with flu shot. He has cards appt pending in  <  2 months per hx   12/10/2014 [facility] follow up  Patient presents for a post hospital follow-up Patient was admitted April 5 through April 7 for pneumonia and acute on chronic hypercarbic respiratory failure Influenza panel was negative. He was treated with IV antibiotics, steroids, and nebulized bronchodilators. CT chest showed a right middle lobe pneumonia. Patient remains on trach collar 4.5L  during the daytime with vent support at night. He says since discharge he is feeling some better. He still continues to feel weak. He denies any hemoptysis, orthopnea, PND or leg swelling Seen by ENT with trach change  Last week.  Remains on Pulmicort and brovana nebs.  Wt is down from discharge (~6 lbs ) , decreased leg swelling.  Does tell me he turns up O2 to 6-7 L when he feels sob even though O2 sats >90%.  . We discussed O2 sats gaol of 90-92%.     OV 01/06/2015  Chief Complaint  Patient presents with  . Follow-up    Pt c/o increase in SOB, prod cough with yellow and green mucus, abdominal pain- pt states the pain is causing his breathing to worsen. Pt c/o chest tightness and pain when coming off vent in morning. Pt has BIL LE pitting edema.     Follow-up chronic respiratory failure status post tracheostomy on account of COPD and  chronic diastolic heart failure  Hospitalized early 2016 April. Review of labs show consolidation on x-ray and CT scan with cultures growing pseudomonas pansensitive. He followed up with my nurse practitioner  one month ago late in April 2016 with considerable improvement in chest x-ray and symptoms. At this time sputum had improved in color as well. For the last several days this increase in sputum thickness and change in color to green. Increased cough. He has also severe chronic bilateral venous stasis edema that he says is unchanged but he is more short of breath and is coughing more. There is no fever or chills.  According to the wife was here with him few to several days ago he was transfused for hemoglobin of 6.4 g percent by the nephrologist. Most recent creatinine in May 2016 shows creatinine of 2.6 by gram percent and a hemoglobin in the order of 7.6 g percent which is presumably post transfusion.  Most recent chest x-ray from end April 2016 that I personally personally visualized: Shows infiltrates that seem to be improving compared to April 2016.  More importantly: He is expressing frustration with his quality of life with a tracheostomy. He absolutely hates it. He wants to come off this. He wants to know his consequences. His wife has left it to his physician. After him that there is high risk for progressive respiratory failure and intubation. Explained that April 2069 admission would've normally put him on a ventilator and due to had discussions of tracheostomy and the presence of a chronic tracheotomy prevented him from ending up on a ventilator and going to an LTAC. He still does not seem to comprehend this. He seems to believe that he would be better off without a tracheostomy.    Allergies  Allergen Reactions  . Amlodipine Besy-Benazepril Hcl Swelling    Lips swell  . Percocet [Oxycodone-Acetaminophen] Other (See Comments)    hallucinations     Current outpatient prescriptions:   .  acetaminophen (TYLENOL) 500 MG tablet, Take 500 mg by mouth every 6 (six) hours as needed for mild pain or moderate pain., Disp: , Rfl:  .  albuterol (PROVENTIL) (2.5 MG/3ML) 0.083% nebulizer solution, Take 3 mLs (2.5 mg total) by nebulization every 4 (four) hours as needed for wheezing., Disp: 360 mL, Rfl: 5 .  allopurinol (ZYLOPRIM) 100 MG tablet, Take 1 tablet by mouth daily., Disp: , Rfl:  .  arformoterol (BROVANA) 15 MCG/2ML NEBU, Take 2 mLs (15 mcg total) by nebulization 2 (two) times daily., Disp: 120 mL, Rfl: 2 .  aspirin 81 MG tablet, Take 81 mg by mouth daily., Disp: , Rfl:  .  budesonide (PULMICORT) 0.5 MG/2ML nebulizer solution, Take 0.5 mg by nebulization 2 (two) times daily., Disp: , Rfl:  .  clopidogrel (PLAVIX) 75 MG tablet, Take 1 tablet (75 mg total) by mouth daily., Disp: 30 tablet, Rfl: 10 .  COLCRYS 0.6 MG tablet, Take 0.6 mg by mouth daily. , Disp: , Rfl:  .  furosemide (LASIX) 40 MG tablet, Take 1 tablet (40 mg total) by mouth 2 (two) times daily., Disp: 90 tablet, Rfl: 3 .  metolazone (ZAROXOLYN) 2.5 MG tablet, TAKE ONE TABLET BY MOUTH ONCE DAILY ON  MONDAY,  WEDNESDAY,  AND  FRIDAY  30  MINUTES  BEFORE  LASIX, Disp: 30 tablet, Rfl: 5 .  nitroGLYCERIN (NITROSTAT) 0.4 MG SL tablet, Place 1 tablet (0.4 mg total) under the tongue every 5 (five) minutes as needed. For chest pain, Disp: 25 tablet, Rfl: 6 .  dextromethorphan-guaiFENesin (MUCINEX DM) 30-600 MG per 12 hr tablet, Take 1 tablet by mouth 2 (two) times daily. (Patient not taking: Reported on 01/06/2015), Disp: 10 tablet, Rfl: 0  Review of Systems  Constitutional: Negative for fever and unexpected weight change.  HENT: Negative for congestion, dental problem, ear pain, nosebleeds, postnasal drip, rhinorrhea, sinus pressure, sneezing, sore throat and trouble swallowing.   Eyes: Negative for redness and itching.  Respiratory: Positive for cough and shortness of breath. Negative for chest tightness and wheezing.    Cardiovascular: Positive for leg swelling. Negative for palpitations.  Gastrointestinal: Positive for abdominal pain. Negative for nausea and vomiting.  Genitourinary: Negative for dysuria.  Musculoskeletal: Negative for joint swelling.  Skin: Negative for rash.  Neurological: Negative for headaches.  Hematological: Does not bruise/bleed easily.  Psychiatric/Behavioral: Negative for dysphoric mood. The patient is not nervous/anxious.        Objective:   Physical Exam  Constitutional: He is oriented to person, place, and time. He appears well-developed and well-nourished. No distress.  HENT:  Head: Normocephalic and atraumatic.  Right Ear: External ear normal.  Left Ear: External ear normal.  Mouth/Throat: Oropharynx is clear and moist. No oropharyngeal exudate.  Tracheostomy\or. Coughing green sputum  Eyes: Conjunctivae and EOM are normal. Pupils are equal, round, and reactive to light. Right eye exhibits no discharge. Left eye exhibits no discharge. No scleral icterus.  Neck: Normal range of motion. Neck supple. No JVD present. No tracheal deviation present. No thyromegaly present.  Cardiovascular: Normal rate, regular rhythm and intact distal pulses.  Exam reveals no gallop and no friction rub.   No murmur heard. Pulmonary/Chest: Effort normal and breath sounds normal. No respiratory distress. He has no wheezes. He has no rales. He exhibits no tenderness.  Overall diminished air entry  Abdominal: Soft. Bowel sounds are normal. He exhibits no distension and no mass. There is no tenderness. There is no rebound and no guarding.  Musculoskeletal: Normal range of motion. He exhibits edema. He exhibits no tenderness.  Chronic 3+ edema  Lymphadenopathy:    He has no cervical adenopathy.  Neurological: He is alert and oriented to person, place, and time. He has normal reflexes. No cranial nerve deficit. Coordination normal.  Skin: Skin is warm and dry. No rash noted. He is not  diaphoretic. No erythema. No pallor.  Psychiatric: He has a normal mood and affect. His behavior is normal. Judgment and thought content normal.  Nursing note and vitals reviewed.   Filed Vitals:   01/06/15 1513  BP: 102/58  Pulse: 108  Height: 6\' 1"  (1.854 m)  Weight: 297 lb (134.718 kg)  SpO2: 96%         Assessment & Plan:     ICD-9-CM ICD-10-CM   1. Chronic respiratory failure, unspecified whether with hypoxia or hypercapnia 518.83 J96.10 Respiratory or Resp and Sputum Culture     DG Chest 2 View     CBC w/Diff     Basic Metabolic Panel (BMET)     ciprofloxacin (CIPRO) 750 MG tablet  2. 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 Chronic diastolic CHF (congestive heart failure) 428.32 I50.32 Respiratory or Resp and Sputum Culture   428.0  DG Chest 2 View     CBC w/Diff     Basic Metabolic Panel (BMET)     ciprofloxacin (CIPRO) 750 MG tablet  4. 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 Recurred  plan Increase lasix from 80/20 to 80mg  twice daily Give sputum for gram stain and culture Do CXR 2 view Do CBC and BMET today STart ciprofloxacin 750mg  twice daily x 12 days; might change this depending on culture rsults Please let Dr Redmond Baseman know your decision to remove tracheostomy if you decide so  - we have discussed consequences and I will support your care if you decide so Otherwise, continue vent at night and  o2 support as before   Followup 2 weeks to see me or Tammy    Dr. Brand Males, M.D., [facility].C.P Pulmonary and Critical Care Medicine Staff Physician [facility] Pager: [phone], If no answer or between  15:00h -  7:00h: call [phone]  01/06/2015 3:45 PM

## 2015-01-06 NOTE — Patient Instructions (Addendum)
ICD-9-CM ICD-10-CM   1. Chronic respiratory failure, unspecified whether with hypoxia or hypercapnia 518.83 J96.10   2. 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 Chronic diastolic CHF (congestive heart failure) 428.32 I50.32    428.0    4. Pneumonia due to Pseudomonas 482.1 J15.1    Increase lasix from 80/20 to 80mg  twice daily Give sputum for gram stain and culture Do CXR 2 view Do CBC and BMET today STart ciprofloxacin 750mg  twice daily x 12 days; might change this depending on culture rsults Please let Dr Redmond Baseman know your decision to remove tracheostomy if you decide so  - we have discussed consequences and I will support your care if you decide so Otherwise, continue vent at night and  o2 support as before   Followup 2 weeks to see me or Tammy

## 2015-01-07 ENCOUNTER — Ambulatory Visit: Payer: Medicare Other | Admitting: Internal Medicine

## 2015-01-07 DIAGNOSIS — J440 Chronic obstructive pulmonary disease with acute lower respiratory infection: Secondary | ICD-10-CM | POA: Diagnosis not present

## 2015-01-07 DIAGNOSIS — I2510 Atherosclerotic heart disease of native coronary artery without angina pectoris: Secondary | ICD-10-CM | POA: Diagnosis not present

## 2015-01-07 DIAGNOSIS — E1165 Type 2 diabetes mellitus with hyperglycemia: Secondary | ICD-10-CM | POA: Diagnosis not present

## 2015-01-07 DIAGNOSIS — N183 Chronic kidney disease, stage 3 (moderate): Secondary | ICD-10-CM | POA: Diagnosis not present

## 2015-01-07 DIAGNOSIS — I10 Essential (primary) hypertension: Secondary | ICD-10-CM | POA: Diagnosis not present

## 2015-01-07 NOTE — Telephone Encounter (Signed)
lmtcb for pt.  

## 2015-01-07 NOTE — Telephone Encounter (Signed)
LMTCB x 1 

## 2015-01-07 NOTE — Telephone Encounter (Signed)
Pt returning call.[name] [name] ° °

## 2015-01-07 NOTE — Telephone Encounter (Signed)
Pt wife returning call and can be reache @ [phone] or [phone].Hillery Hunter

## 2015-01-07 NOTE — Telephone Encounter (Signed)
[id number] x1 

## 2015-01-08 ENCOUNTER — Telehealth: Payer: Self-pay | Admitting: Internal Medicine

## 2015-01-08 NOTE — Telephone Encounter (Signed)
Spoke with wife. Informed of results and went over the follow up instructions. She states they already have the f/u appt scheduled. Nothing further needed at this time.

## 2015-01-08 NOTE — Telephone Encounter (Signed)
Renelda Mom, LPN at 5/40/0867 61:95 AM     Status: Signed       Expand All Collapse All   Spoke with wife. Informed of results and went over the follow up instructions. She states they already have the f/u appt scheduled. Nothing further needed at this time.         Will sign off.

## 2015-01-09 LAB — RESPIRATORY CULTURE OR RESPIRATORY AND SPUTUM CULTURE: Gram Stain: NONE SEEN

## 2015-01-11 DIAGNOSIS — J9610 Chronic respiratory failure, unspecified whether with hypoxia or hypercapnia: Secondary | ICD-10-CM | POA: Diagnosis not present

## 2015-01-12 ENCOUNTER — Telehealth: Payer: Self-pay | Admitting: Internal Medicine

## 2015-01-12 NOTE — Telephone Encounter (Signed)
Culture results show H Flu (last time it was pseduomonas). For this cipro 7d is good enough. He should take tomorrow dose and stop with last dose on evening of 01/13/15. Pleas ask if he is feeling better?   Results for orders placed or performed in visit on 01/06/15  Respiratory or Resp and Sputum Culture     Status: None   Collection Time: 01/06/15  3:56 PM  Result Value Ref Range Status   Gram Stain Abundant  Final   Gram Stain WBC present-predominately PMN  Final   Gram Stain No Squamous Epithelial Cells Seen  Final   Gram Stain Moderate Gram Negative Coccobacilli  Final   Organism ID, Bacteria Abundant HAEMOPHILUS [id number]  Final    Comment: Beta Lactamase Positive

## 2015-01-12 NOTE — Telephone Encounter (Signed)
Called and spoke to pt's wife. Pt's wife stated pt is feeling some improvement. The pt's mucus is not as discolored but still thick. Pt's SOB has also improved. Overall pt feels he has improved. The 7th day for the abx will be on 6/1. Pt aware to take last dose on 6/1.   Will forward to MR as FYI.

## 2015-01-14 ENCOUNTER — Encounter (HOSPITAL_COMMUNITY)
Admission: RE | Admit: 2015-01-14 | Discharge: 2015-01-14 | Disposition: A | Discharge status: Home / Self Care | Payer: Medicare Other | Source: Ambulatory Visit | Attending: Nephrology | Admitting: Nephrology

## 2015-01-14 DIAGNOSIS — N183 Chronic kidney disease, stage 3 (moderate): Secondary | ICD-10-CM | POA: Insufficient documentation

## 2015-01-14 DIAGNOSIS — D631 Anemia in chronic kidney disease: Secondary | ICD-10-CM | POA: Insufficient documentation

## 2015-01-14 LAB — POCT HEMOGLOBIN-HEMACUE: HEMOGLOBIN: 7.1 g/dL — AB (ref 13.0–17.0)

## 2015-01-14 MED ORDER — DARBEPOETIN ALFA 200 MCG/0.4ML IJ SOSY
200.0000 ug | PREFILLED_SYRINGE | INTRAMUSCULAR | Status: DC
  Administered 2015-01-14: 200 ug via SUBCUTANEOUS

## 2015-01-14 MED ORDER — DARBEPOETIN ALFA 200 MCG/0.4ML IJ SOSY
PREFILLED_SYRINGE | INTRAMUSCULAR | Status: AC
  Administered 2015-01-14: 200 ug via SUBCUTANEOUS
  Filled 2015-01-14: qty 0.4

## 2015-01-14 NOTE — Progress Notes (Signed)
PT came in today for scheduled labs and aransep shot.  Pt's HemoCue was 7.1 today.  Pt states that he has had no blood in stool No shortness of breath no chest pain.  [facility] was called no changes on orders per md

## 2015-01-15 DIAGNOSIS — I2510 Atherosclerotic heart disease of native coronary artery without angina pectoris: Secondary | ICD-10-CM | POA: Diagnosis not present

## 2015-01-15 DIAGNOSIS — J449 Chronic obstructive pulmonary disease, unspecified: Secondary | ICD-10-CM | POA: Diagnosis not present

## 2015-01-15 DIAGNOSIS — N183 Chronic kidney disease, stage 3 (moderate): Secondary | ICD-10-CM | POA: Diagnosis not present

## 2015-01-15 DIAGNOSIS — I5031 Acute diastolic (congestive) heart failure: Secondary | ICD-10-CM | POA: Diagnosis not present

## 2015-01-22 ENCOUNTER — Ambulatory Visit (INDEPENDENT_AMBULATORY_CARE_PROVIDER_SITE_OTHER)
Admission: RE | Admit: 2015-01-22 | Discharge: 2015-01-22 | Disposition: A | Discharge status: Home / Self Care | Payer: Medicare Other | Source: Ambulatory Visit | Attending: Adult Health | Admitting: Adult Health

## 2015-01-22 ENCOUNTER — Encounter: Payer: Self-pay | Admitting: Adult Health

## 2015-01-22 ENCOUNTER — Ambulatory Visit (INDEPENDENT_AMBULATORY_CARE_PROVIDER_SITE_OTHER): Payer: Medicare Other | Admitting: Adult Health

## 2015-01-22 ENCOUNTER — Other Ambulatory Visit (INDEPENDENT_AMBULATORY_CARE_PROVIDER_SITE_OTHER): Payer: Medicare Other

## 2015-01-22 VITALS — BP 102/62 | HR 95 | Temp 98.7°F | Ht 73.0 in | Wt 285.0 lb

## 2015-01-22 DIAGNOSIS — J181 Lobar pneumonia, unspecified organism: Secondary | ICD-10-CM

## 2015-01-22 DIAGNOSIS — J9612 Chronic respiratory failure with hypercapnia: Secondary | ICD-10-CM | POA: Diagnosis not present

## 2015-01-22 DIAGNOSIS — J449 Chronic obstructive pulmonary disease, unspecified: Secondary | ICD-10-CM | POA: Diagnosis not present

## 2015-01-22 DIAGNOSIS — J9610 Chronic respiratory failure, unspecified whether with hypoxia or hypercapnia: Secondary | ICD-10-CM

## 2015-01-22 DIAGNOSIS — J984 Other disorders of lung: Secondary | ICD-10-CM | POA: Diagnosis not present

## 2015-01-22 LAB — BASIC METABOLIC PANEL
BUN: 55 mg/dL — ABNORMAL HIGH (ref 6–23)
CALCIUM: 9.4 mg/dL (ref 8.4–10.5)
CO2: 46 meq/L — AB (ref 19–32)
CREATININE: 2.01 mg/dL — AB (ref 0.40–1.50)
Chloride: 84 mEq/L — ABNORMAL LOW (ref 96–112)
GFR: 43.39 mL/min — ABNORMAL LOW (ref >60.00)
Glucose, Bld: 108 mg/dL — ABNORMAL HIGH (ref 70–99)
Potassium: 4.5 mEq/L (ref 3.5–5.1)
SODIUM: 136 meq/L (ref 135–145)

## 2015-01-22 NOTE — Patient Instructions (Addendum)
Continue on current regimen  follow up with ENT as planned  Labs today  Low salt diet  Legs elevated.  Follow up Dr. Chase Caller in 6 weeks with chest xray

## 2015-01-22 NOTE — Assessment & Plan Note (Addendum)
Admitted 2 months ago with pneumonia with lingering cough- seems to be improved after anabiotic Patient is clinically improving Chest x-ray is improved some with diuresis Continue to follow chest x-ray on return.

## 2015-01-22 NOTE — Progress Notes (Signed)
Subjective:    Patient ID: [name] [name], male    DOB: [birth year], 64 y.o.   MRN: [id number]   HPI   HPI 01/23/11- COPD, CAD/diastolic dysfunction, OSA, chronic respiratory failure, obesity Hypoventilation, cor pulmonale Hosp since last here- once for "shakes"-they told him wasn't using CPAP right, once for epistaxis, once to place urinary stent, and has had a right ? renal biopsy. Told anemic.  Breathing stable, but can't lie flat- feels smothered even on O2.  Runs O2 2-3L/M usually. I gave permision to go to 4 during exertion. Notes "tiresome" feeling midchest, related to exertion/ climbing stairs and relieved by rest. Told to pace himself and not try to push through that.  Discussed need for O2. He doesn't sleep well with his BiPAP 15/12. Discussed sleep hygiene- discussed room temperature. He was more comfortable with autotitration in hosp and we discussed a trial of that at home.  Has restarted diuretic since leg edema has come back.   05/25/11-  COPD, CAD/diastolic dysfunction, OSA, chronic respiratory failure, obesity Hypoventilation, cor pulmonale Went to ER last week- short of breath. Air wasn't satisfying. Was started back on shot for anemia. Feels some better.  Reports sneezing, postnasal drainage and a sense of mucus in his upper chest. Denies fever, sore throat and doesn't think he has a cold.  06/28/11-  COPD, CAD/diastolic dysfunction, OSA, chronic respiratory failure, obesity Hypoventilation, cor pulmonale Recently hospitalized October 25-29, notes reviewed with him and x-ray images reviewed by me. He was hospitalized for hypercapnic respiratory failure with transient encephalopathy and renal insufficiency. Since discharge he has regained some ankle edema while off of furosemide. He has a nephrology appointment later this month. He is concerned that he was taken off his diabetes medicines and is directed to discuss this with his primary physician immediately. He has some  persistent soreness across his anterior chest wall at the level of the xiphoid but otherwise breathing better with less exertional dyspnea and before he went in the hospital. He remains dependent on continuous oxygen at 2 L. He continues his BiPAP machine all night, every night but says he was sleeping better with it  on autotitration with a lower pressure used in the hospital.  09/25/11- COPD, CAD/diastolic dysfunction, OSA, chronic respiratory failure, obesity Hypoventilation, cor pulmonale Since last here he was hospitalized for respiratory failure with CHF and obesity hypoventilation. Better with diuresis. BiPAP 13/10 is now comfortable and used all night every night with supplemental oxygen 4 L. Tolerated colonoscopy without respiratory distress. Uses a rescue inhaler only occasionally but says it does help then.  11/23/11- COPD, CAD/diastolic dysfunction, OSA, chronic respiratory failure, obesity Hypoventilation, cor pulmonale She is noticing some increased shortness of breath on exertion such as getting in the car but no increase in ankle edema which is well controlled. Occasional cough is not progressive or productive. Noticing more rhinorrhea with the pollen. Continues BiPAP  I 13/E 10 all night every night.  02/07/12- COPD, CAD/diastolic dysfunction, OSA, chronic respiratory failure, obesity Hypoventilation, cor pulmonale  pt states doing some better.wakes up out of a nap coughing feels like he's choking.  Post Hosp- 01/14/12- 01/23/12-discharge diagnoses reviewed with him: Coronary artery disease/CABG, diastolic dysfunction, COPD with chronic respiratory failure, acute respiratory failure with hypoxia, acute on chronic renal failure, DM. He was intubated for 4 days and treated with Zosyn, vancomycin and Avelox, Levaquin, Zithromax and Rocephin. Cultures negative. He was to wean off of prednisone over 3 days. He was hoarse after intubation but that  has resolved. He is using oxygen 1-2 L and BiPAP   13/10 at night. He feels he is pretty close to his baseline.  03/28/12-  COPD, CAD/diastolic dysfunction/ chronic CHF, OSA, chronic respiratory failure, obesity Hypoventilation, cor pulmonale  6 MWT today. Pt states breathing has been "okay" since last OV. Pt states having trouble falling alseep-not sleeping well on BiPAP. Pt states mask fit well but thinks the pressure needs to be adjusted again. Currently 13/10+ oxygen at 2 L/ Advanced. He sleeps better in a recliner with oxygen but without his BiPAP. Getting iron injections for anemia. We discussed shortness of breath and anemia. Pain left anterior axillary line described as a sore ache over the past week. He is able to raise his arm. Thinks he maybe lifted something wrong. COPD assessment test (CAT) 21/40 CXR 01/14/12- 1 view- IMPRESSION:  Stable support apparatus. Residual mild interstitial prominence  with slight improvement in aeration. No convincing pulmonary  edema. Probable small left pleural effusion with left basilar  atelectasis or infiltrate.  Original Report Authenticated By: [name], M.D.   03/08/35-UYQI, CAD/diastolic dysfunction/ chronic CHF, OSA, chronic respiratory failure, obesity Hypoventilation, cor pulmonale Hospitalized again between March 9 and 12 for acute on chronic renal failure with congestive heart failure, chronic respiratory failure on BiPAP and acute respiratory failure with hypoxia. He says he is feeling better at this time. Continues oxygen 3 L/Advanced. He has been using BiPAP 13/10 but thinks he slept better with CPAP. We discussed options. Usually when he decompensates it is from fluid overload triggering CO2 retention and hypoventilation.  CXR 10/20/12 IMPRESSION:  Vascular congestion and cardiomegaly, with small bilateral pleural  effusions and bibasilar airspace opacification. Findings are most  compatible with recurrent pulmonary edema, though underlying  pneumonia cannot be excluded.  Original Report  Authenticated By: [name], M.D.  12/02/2012 [facility] follow up  Returns for a post hospital follow up .  Reports doing well overall but still having some SOB, thinks this may be due to his pressure not being high enough on BIPAP. Since decreasing BIPAP 1 month ago  down to 12/10 he does not feel as good. Trouble sleeping.  Was admitted for decompensated cor pulmonale w/ fluid overload with subsequent acute on chronic hypercarbic resp failure.  He admits he can not afford his advair and spiriva- therefore he is not taking.  He does not wear BIPAP everynight. We discussed the implications of this and untreated OHS/OSA. He naps a lot but does not wear BIPAP.  He was treated with diuresis with improvement.  Discharged on Lasix 40mg  . Lower dose due to bump in scr. Has OP follow up with Renal d/t renal insufficiency.   02/05/13- COPD, CAD/diastolic dysfunction/ chronic CHF, OSA, chronic respiratory failure, obesity Hypoventilation, cor pulmonale Hosp 6/16-6/19/14 Acute on Chronic Resp Failure Discharge Plan:  -pt educated on BiPAP compliance at home  -continue pulmicort, scheduled albuterol / atrovent (med cost has been an issue)  -wean prednisone to off  -given information for [employer] at [employer]. Pulmonary Rehab was a cost issue (45$ per visit)  -RN home health to review medications and reinforce need for BiPAP 12/12 wO2 2-3L for sleep, O2 3-4 awake.  FOLLOWS FOR: Breathing has improved since leaving the hospital. Reports DOE, chest tightness with exertion and slight coughing from time to time. Wife needs FMLA form. CXR 01/29/13 1View IMPRESSION:  Slightly improved aeration in the lungs may represent decreasing  edema.  Original Report Authenticated By: Markus Daft, M.  04/11/13- COPD, CAD/diastolic dysfunction/ chronic CHF, OSA, chronic respiratory failure, obesity Hypoventilation, cor pulmonale FOLLOWS FOR: reports breathing is essentially unchanged but he feels like he's  "burning through more oxygen than I used to." denies wheezing, increased SOB, tightness, increased cough, f/c/s Just in hospital again acute on chronic respiratory failure with CHF and COPD. Today he feels well except for frontal headache. Continuing oxygen at 2.5 L. BIPAP Auto 5-15/ O2 2L/ Advanced CXR 1V 8/14 IMPRESSION:  1. Extubated, enteric tube removed. Stable lung volumes.  2. Stable ventilation. Small pleural effusions.  Electronically Signed  By: Lars Pinks  On: 04/02/2013 07:43  06/23/2013 [facility] follow up  Patient presents for a post hospital followup. Patient was admitted October 7 through October 30 for acute on chronic respiratory failure. Patient did require intubation for airway protection in the emergency room due to acute encephalopathy. Unfortunately, patient was unable to be weaned from the ventilator and underwent a tracheostomy. Patient was weaned to trach collar during the daytime and vent support. At bedtime. Lurline Idol was placed by ENT Dr. Redmond Baseman. He was transitioned to an extra long #6 trach prior to discharge.  Patient returns today accompanied by his wife. Patient reports that he has been doing okay. He strength. He is tolerating vent support at night. However, he does feel that this is uncomfortable in certain times. Vent settings at night are PRVC at a rate of 14 with a tidal volume of 450 cc with FiO2 of 40%, and a PEEP of 5. Patient has been using Passy-Muir valve for speech however, over the last couple days, feels it's been more difficulties than his usual. He does have follow with ENT later this week for trach evaluation. Patient denies any hemoptysis, trach site. Bleeding, Orthopnea, PND, or increased leg swelling. Has been eating okay without any noted , dysphagia. Appetite is decreased with no nausea, vomiting, diarrhea.  REC Continue on current regimen.  follow up with ENT this week for [facility] evaluation.  I will call with labs and xray results.    Continue on Trach Collar during daytime  Continue on Vent support At bedtime   Follow up Dr. Chase Caller in 6-8 weeks and As needed .  Please contact office for sooner follow up if symptoms do not improve or worsen or seek emergency care   Late Add : ? Right sided infiltrate  Add Levaquin 500mg  daily x 7 days #7 , no refills.    OV 07/21/2013 Followup chronic respiratory failure status post tracheostomy. Since seeing my nurse practitioner a month ago he is doing well. Uses ventilator at night and daytime is on tracheostomy collar. He is not compliant with his Passy-Muir valve because it gives him a sensation of suffocation. But he is able to talk with open tracheostomy. He still reports chronic stable dyspnea on exertion for walking a block or sometimes half a block only. This is relieved by rest. Rated as moderate. There is no associated chest pain no worsening cough or phlegm. He did have some hemoptysis yesterday and he is trying to get in contact with the ENT specialist. As noted just beside bleeding. He denies any orthopnea or paroxysmal nocturnal dyspnea. Eating okay.  Lab review I do note that is not had a CT scan of the chest since 2012 rec Glad you are doing well CMA will refer you to pumonary rehab on basis of chronic resp failure Please do CT scan chest for pleural fibrosis, pleural plaques Followup with ENT> Dr Redmond Baseman, changed trach  on 12/15  07/31/2013 f/u ov/Wert re: worse since 07/24/13 (day of CT Chest - see below) Chief Complaint  Patient presents with  . Acute Visit    Pt c/o increased SOB for the past several days. He states gets out of breath with any sort of exertion.  He has minimal cough that is non prod.   mucus is clear/ not bloody Baseline use of alb not much at all,  One every few days at most Assoc with increased fluid in legs  But followed by Gwenlyn Found who adjusts his lasix  No obvious patterns in day to day or daytime variabilty or assoc chronic cough or cp or  chest tightness, subjective wheeze overt sinus or hb symptoms. No unusual exp hx or h/o childhood pna/ asthma or knowledge of premature birth.  Sleeping ok without nocturnal  or early am exacerbation  of respiratory  c/o's or need for noct saba. Also denies any obvious fluctuation of symptoms with weather or environmental changes or other aggravating or alleviating factors except as outlined above    09/15/2013 Follow up  Returns for a 2 month follow up for COPD, chronic resp failure -trach dependent.  Reports breathing has worsened x2 weeks w/ increased SOB, chest tightness, cough with white mucus. Coughing and congestion make it difficult to use vent at night . Denies any f/c/s, nausea, vomiting, hemoptysis, edema.  Was referred to pulmonary rehab however could not afford co-pay amounts. We discussed looking into pt assistance.    OV 10/28/2013  Chief Complaint  Patient presents with  . Follow-up    Pt is c/o rattling in his chest, and dry cough. Pt also c/o increased swelling in both ankles.     Followup chronic respiratory failure status post tracheostomy by ENT. Chronic respiratory failure on account of COPD, OSA, diastolic heart failure  - Overall he is stable. He reports using the ventilator at night but occasionally he is noncompliant. When he does not use the ventilator the next day he is more fatigued according to his wife. In the daytime he is on trach collar on room air of liters of oxygen. He occasionally uses his Passy-Muir valve for talk but otherwise feels a sense of suffocation. The last few weeks he's noticed some increased pedal edema and despite increasing and self titrating his Lasix. In association with this he is more rattling in his chest and more shortness of breath and cough but there is no change in sputum color. The sputum is not pink and frothy. He has not seen his cardiologist Dr.? Dr Adora Fridge a while and he plans to see him more expeditiously. Denies any chest pain  cough fever chills or wheezing.  Copd appears stable However, diastolic chf might be worse My office will help you seen by Dr Gwenlyn Found office next day or two  REturn to see me in 4 months or sooner if needed  - continue trach care through ENT clinic   OV 02/24/2014  Chief Complaint  Patient presents with  . Follow-up    Pt c/o increase SOB, cough with clear mucous, intermittent edema in BIL ankles. C/o upper mid sternal CP and left upper CP when on ventilator.    Followup chronic respiratory failure on account of obesity, sleep apnea, COPD and diastolic heart failure  - He continues to feel poorly. For the last month or so he produces increased shortness of breath associated with worsening pedal edema. Is only on Lasix 40 mg once daily. He says he  is compliant with his nebulizers. Although, he is noncompliant with his ventilator stating that the pressures are too high. He probably uses it less than 50% of the time. In addition he is very keen on getting his tracheostomy removed permanently this is despite our advice that he keep it in permanently. He is frustrated by his quality of life. I've explained to him that weight loss is paramount but he is not too keen on this. He reports that his cough, sputum or in sputum color or roughly the same as baseline without any fever >>pred taper , increased lasix 40mg  Twice daily    03/10/14 Follow up COPD/OHS/ Trach Depend RF /Noct Vent Returns for follow up for COPD . Seen last ov with COPD flare and suspected decompensated Diastolic CHF. He was treated with prednisone taper and diuretics were increased.  He says he is some better but gets winded easily.  Encouraged to use vent at night .  Leg swelling slightly better. Labs last week showed scr stable.  No chest pain , hemoptyiss, fever or n/vd/     O 05/12/2014  Chief Complaint  Patient presents with  . Follow-up    Pt states his breathing has slightly worsened since last OV. Pt states he has a  slight increase in SOB, non prod cough and chest pain when coming off the vent in the morning.    Followup chronic respiratory failure on account of obesity, sleep apnea, COPD and diastolic heart failure and Trach Depend RF /Noct Vent   - presents with wife. Continues to report frustration having trach in life; more resigned to having it. He is frustrated by class 3 exertional fatigue and dyspnea. REcently hgb 6.3gm% at renal; no obvious bleed and renal managing it but dyspnea is chronic. Noc hange in baseline cough and white sputum of moderate intensity. Continues vent at night; more compliant.  Uptodate with flu shot. He has cards appt pending in  <  2 months per hx   12/10/2014 [facility] follow up  Patient presents for a post hospital follow-up Patient was admitted April 5 through April 7 for pneumonia and acute on chronic hypercarbic respiratory failure Influenza panel was negative. He was treated with IV antibiotics, steroids, and nebulized bronchodilators. CT chest showed a right middle lobe pneumonia. Patient remains on trach collar 4.5L  during the daytime with vent support at night. He says since discharge he is feeling some better. He still continues to feel weak. He denies any hemoptysis, orthopnea, PND or leg swelling Seen by ENT with trach change  Last week.  Remains on Pulmicort and brovana nebs.  Wt is down from discharge (~6 lbs ) , decreased leg swelling.  Does tell me he turns up O2 to 6-7 L when he feels sob even though O2 sats >90%.  . We discussed O2 sats gaol of 90-92%.    01/22/2015 Follow up: COPD /trach dependent , noct vent  Returns for 2 week follow up.  Patient was given Cipro last visit for persistent cough and congestion. Last ov lasix increased for fluid overload.  Reports breathing is improved since last ov, cough is now clear, still having some occasional tightness when coming off the vent. Overall feels better.   CXR shows slight improvement in  aeration today  Wt is down .Leg swelling is less.   Going to ENT to discuss trach options-would prefer to not have trach.   He denies any chest pain, orthopnea, PND or increased  leg swelling  Review of Systems  Constitutional:  Negative for fever and unexpected weight change.  HENT: Negative for congestion, dental problem, ear pain, nosebleeds, postnasal drip, rhinorrhea, sinus pressure, sneezing, sore throat and trouble swallowing.   Eyes: Negative for redness and itching.  Respiratory: Positive for cough and shortness of breath. Negative for chest tightness and wheezing.   Cardiovascular: . Negative for palpitations and leg swelling.  Gastrointestinal:. Negative for nausea and vomiting.  Genitourinary: Negative for dysuria.  Musculoskeletal: Negative for joint swelling.  Skin: Negative for rash.  Neurological: Negative for headaches.  Hematological: Does not bruise/bleed easily.  Psychiatric/Behavioral: Negative for dysphoric mood. The patient is not nervous/anxious.        Objective:   Physical Exam     HENT:  Head: Normocephalic and atraumatic.  Right Ear: External ear normal.  Left Ear: External ear normal.  Mouth/Throat: Oropharynx is clear and moist. No oropharyngeal exudate.  Trach +. Speaking through [place]  Eyes: Conjunctivae and EOM are normal. Pupils are equal, round, and reactive to light. Right eye exhibits no discharge. Left eye exhibits no discharge. No scleral icterus.  Neck: Normal range of motion. Neck supple. No JVD present. No tracheal deviation present. No thyromegaly present.  Cardiovascular: Normal rate, regular rhythm and intact distal pulses.  Exam reveals no gallop and no friction rub.   No murmur heard. Pulmonary/Chest: Effort normal and breath sounds normal. No respiratory distress. He has no wheezes. He has no rales. He exhibits no tenderness.  Abdominal: Soft. Bowel sounds are normal. He exhibits no distension and no mass. There is no tenderness.  There is no rebound and no guarding.  Musculoskeletal: Normal range of motion. He exhibits edema. He exhibits no tenderness.  + 1 edema  Lymphadenopathy:    He has no cervical adenopathy.  Neurological: He is alert and oriented to person, place, and time. He has normal reflexes. No cranial nerve deficit. Coordination normal.  Skin: Skin is warm and dry. No rash noted. He is not diaphoretic. No erythema. No pallor.  Psychiatric: He has a normal mood an   CXR 01/22/2015    Persistent bilateral pleural and parenchymal disease but slight improvement in the left lower chest.   Assessment & Plan:

## 2015-01-22 NOTE — Assessment & Plan Note (Signed)
Chronic hypercarbic respiratory failure, now trach dependent with nocturnal event Patient is doing well on his current settings.

## 2015-01-22 NOTE — Assessment & Plan Note (Signed)
Compensated on present regimen Does appear improved with diuresis, however, patient has chronic renal insufficiency We'll check the labs today. May need to back down on his Lasix if serum creatinine is trending up

## 2015-01-25 DIAGNOSIS — G4733 Obstructive sleep apnea (adult) (pediatric): Secondary | ICD-10-CM | POA: Diagnosis not present

## 2015-01-25 DIAGNOSIS — J449 Chronic obstructive pulmonary disease, unspecified: Secondary | ICD-10-CM | POA: Diagnosis not present

## 2015-01-25 DIAGNOSIS — Z930 Tracheostomy status: Secondary | ICD-10-CM | POA: Diagnosis not present

## 2015-01-25 DIAGNOSIS — E662 Morbid (severe) obesity with alveolar hypoventilation: Secondary | ICD-10-CM | POA: Diagnosis not present

## 2015-01-27 DIAGNOSIS — J9610 Chronic respiratory failure, unspecified whether with hypoxia or hypercapnia: Secondary | ICD-10-CM | POA: Diagnosis not present

## 2015-01-27 DIAGNOSIS — G4733 Obstructive sleep apnea (adult) (pediatric): Secondary | ICD-10-CM | POA: Diagnosis not present

## 2015-01-27 DIAGNOSIS — R0602 Shortness of breath: Secondary | ICD-10-CM | POA: Diagnosis not present

## 2015-01-27 DIAGNOSIS — J449 Chronic obstructive pulmonary disease, unspecified: Secondary | ICD-10-CM | POA: Diagnosis not present

## 2015-01-28 ENCOUNTER — Encounter (HOSPITAL_COMMUNITY)
Admission: RE | Admit: 2015-01-28 | Discharge: 2015-01-28 | Disposition: A | Discharge status: Home / Self Care | Payer: Medicare Other | Source: Ambulatory Visit | Attending: Nephrology | Admitting: Nephrology

## 2015-01-28 DIAGNOSIS — D631 Anemia in chronic kidney disease: Secondary | ICD-10-CM | POA: Diagnosis not present

## 2015-01-28 DIAGNOSIS — N183 Chronic kidney disease, stage 3 (moderate): Secondary | ICD-10-CM | POA: Diagnosis not present

## 2015-01-28 LAB — POCT HEMOGLOBIN-HEMACUE: Hemoglobin: 7.3 g/dL — ABNORMAL LOW (ref 13.0–17.0)

## 2015-01-28 MED ORDER — DARBEPOETIN ALFA 200 MCG/0.4ML IJ SOSY
200.0000 ug | PREFILLED_SYRINGE | INTRAMUSCULAR | Status: DC
  Administered 2015-01-28: 200 ug via SUBCUTANEOUS

## 2015-01-28 MED ORDER — DARBEPOETIN ALFA 200 MCG/0.4ML IJ SOSY
PREFILLED_SYRINGE | INTRAMUSCULAR | Status: AC
  Filled 2015-01-28: qty 0.4

## 2015-02-04 DIAGNOSIS — R0602 Shortness of breath: Secondary | ICD-10-CM | POA: Diagnosis not present

## 2015-02-11 ENCOUNTER — Encounter (HOSPITAL_COMMUNITY)
Admission: RE | Admit: 2015-02-11 | Discharge: 2015-02-11 | Disposition: A | Discharge status: Home / Self Care | Payer: Medicare Other | Source: Ambulatory Visit | Attending: Nephrology | Admitting: Nephrology

## 2015-02-11 DIAGNOSIS — J9610 Chronic respiratory failure, unspecified whether with hypoxia or hypercapnia: Secondary | ICD-10-CM | POA: Diagnosis not present

## 2015-02-11 DIAGNOSIS — E118 Type 2 diabetes mellitus with unspecified complications: Secondary | ICD-10-CM | POA: Diagnosis not present

## 2015-02-11 DIAGNOSIS — I129 Hypertensive chronic kidney disease with stage 1 through stage 4 chronic kidney disease, or unspecified chronic kidney disease: Secondary | ICD-10-CM | POA: Diagnosis not present

## 2015-02-11 DIAGNOSIS — N183 Chronic kidney disease, stage 3 (moderate): Secondary | ICD-10-CM | POA: Diagnosis not present

## 2015-02-11 DIAGNOSIS — N179 Acute kidney failure, unspecified: Secondary | ICD-10-CM | POA: Diagnosis not present

## 2015-02-11 DIAGNOSIS — D631 Anemia in chronic kidney disease: Secondary | ICD-10-CM | POA: Diagnosis not present

## 2015-02-11 LAB — RENAL FUNCTION PANEL
Albumin: 3.1 g/dL — ABNORMAL LOW (ref 3.5–5.0)
Anion gap: 8 (ref 5–15)
BUN: 46 mg/dL — ABNORMAL HIGH (ref 6–20)
CO2: 49 mmol/L — ABNORMAL HIGH (ref 22–32)
Calcium: 8.6 mg/dL — ABNORMAL LOW (ref 8.9–10.3)
Chloride: 79 mmol/L — ABNORMAL LOW (ref 101–111)
Creatinine, Ser: 2.01 mg/dL — ABNORMAL HIGH (ref 0.61–1.24)
GFR calc Af Amer: 39 mL/min — ABNORMAL LOW (ref >60)
GFR calc non Af Amer: 34 mL/min — ABNORMAL LOW (ref >60)
Glucose, Bld: 204 mg/dL — ABNORMAL HIGH (ref 65–99)
Phosphorus: 2.3 mg/dL — ABNORMAL LOW (ref 2.5–4.6)
Potassium: 3.8 mmol/L (ref 3.5–5.1)
Sodium: 136 mmol/L (ref 135–145)

## 2015-02-11 LAB — POCT HEMOGLOBIN-HEMACUE: Hemoglobin: 7.4 g/dL — ABNORMAL LOW (ref 13.0–17.0)

## 2015-02-11 MED ORDER — DARBEPOETIN ALFA 200 MCG/0.4ML IJ SOSY
PREFILLED_SYRINGE | INTRAMUSCULAR | Status: AC
  Administered 2015-02-11: 200 ug via SUBCUTANEOUS
  Filled 2015-02-11: qty 0.4

## 2015-02-11 MED ORDER — DARBEPOETIN ALFA 200 MCG/0.4ML IJ SOSY
200.0000 ug | PREFILLED_SYRINGE | INTRAMUSCULAR | Status: DC
  Administered 2015-02-11: 200 ug via SUBCUTANEOUS

## 2015-02-12 DIAGNOSIS — Z930 Tracheostomy status: Secondary | ICD-10-CM | POA: Diagnosis not present

## 2015-02-12 DIAGNOSIS — J9612 Chronic respiratory failure with hypercapnia: Secondary | ICD-10-CM | POA: Diagnosis not present

## 2015-02-18 DIAGNOSIS — E1165 Type 2 diabetes mellitus with hyperglycemia: Secondary | ICD-10-CM | POA: Diagnosis not present

## 2015-02-18 DIAGNOSIS — I5032 Chronic diastolic (congestive) heart failure: Secondary | ICD-10-CM | POA: Diagnosis not present

## 2015-02-18 DIAGNOSIS — I2510 Atherosclerotic heart disease of native coronary artery without angina pectoris: Secondary | ICD-10-CM | POA: Diagnosis not present

## 2015-02-18 DIAGNOSIS — I10 Essential (primary) hypertension: Secondary | ICD-10-CM | POA: Diagnosis not present

## 2015-02-18 DIAGNOSIS — J440 Chronic obstructive pulmonary disease with acute lower respiratory infection: Secondary | ICD-10-CM | POA: Diagnosis not present

## 2015-02-23 DIAGNOSIS — G4733 Obstructive sleep apnea (adult) (pediatric): Secondary | ICD-10-CM | POA: Diagnosis not present

## 2015-02-23 DIAGNOSIS — J449 Chronic obstructive pulmonary disease, unspecified: Secondary | ICD-10-CM | POA: Diagnosis not present

## 2015-02-23 DIAGNOSIS — E662 Morbid (severe) obesity with alveolar hypoventilation: Secondary | ICD-10-CM | POA: Diagnosis not present

## 2015-02-23 DIAGNOSIS — Z930 Tracheostomy status: Secondary | ICD-10-CM | POA: Diagnosis not present

## 2015-02-26 ENCOUNTER — Encounter (HOSPITAL_COMMUNITY)
Admission: RE | Admit: 2015-02-26 | Discharge: 2015-02-26 | Disposition: A | Discharge status: Home / Self Care | Payer: Medicare Other | Source: Ambulatory Visit | Attending: Nephrology | Admitting: Nephrology

## 2015-02-26 DIAGNOSIS — D631 Anemia in chronic kidney disease: Secondary | ICD-10-CM | POA: Insufficient documentation

## 2015-02-26 DIAGNOSIS — N183 Chronic kidney disease, stage 3 (moderate): Secondary | ICD-10-CM | POA: Insufficient documentation

## 2015-02-26 LAB — RENAL FUNCTION PANEL
ALBUMIN: 3.3 g/dL — AB (ref 3.5–5.0)
Anion gap: 8 (ref 5–15)
BUN: 46 mg/dL — AB (ref 6–20)
CO2: 44 mmol/L — AB (ref 22–32)
Calcium: 9.3 mg/dL (ref 8.9–10.3)
Chloride: 85 mmol/L — ABNORMAL LOW (ref 101–111)
Creatinine, Ser: 1.98 mg/dL — ABNORMAL HIGH (ref 0.61–1.24)
GFR calc non Af Amer: 34 mL/min — ABNORMAL LOW (ref >60)
GFR, EST AFRICAN AMERICAN: 40 mL/min — AB (ref >60)
Glucose, Bld: 112 mg/dL — ABNORMAL HIGH (ref 65–99)
Phosphorus: 2.6 mg/dL (ref 2.5–4.6)
Potassium: 4.1 mmol/L (ref 3.5–5.1)
Sodium: 137 mmol/L (ref 135–145)

## 2015-02-26 LAB — IRON AND TIBC
Iron: 31 ug/dL — ABNORMAL LOW (ref 45–182)
Saturation Ratios: 10 % — ABNORMAL LOW (ref 17.9–39.5)
TIBC: 300 ug/dL (ref 250–450)
UIBC: 269 ug/dL

## 2015-02-26 LAB — FERRITIN: FERRITIN: 363 ng/mL — AB (ref 24–336)

## 2015-02-26 LAB — POCT HEMOGLOBIN-HEMACUE: Hemoglobin: 7.3 g/dL — ABNORMAL LOW (ref 13.0–17.0)

## 2015-02-26 MED ORDER — DARBEPOETIN ALFA 200 MCG/0.4ML IJ SOSY
PREFILLED_SYRINGE | INTRAMUSCULAR | Status: AC
  Filled 2015-02-26: qty 0.4

## 2015-02-26 MED ORDER — DARBEPOETIN ALFA 200 MCG/0.4ML IJ SOSY
200.0000 ug | PREFILLED_SYRINGE | INTRAMUSCULAR | Status: DC
  Administered 2015-02-26: 200 ug via SUBCUTANEOUS

## 2015-02-26 NOTE — Progress Notes (Signed)
HGB result called to Crystal at [facility]. No changes per Dr. Marval Regal.

## 2015-03-06 DIAGNOSIS — R0602 Shortness of breath: Secondary | ICD-10-CM | POA: Diagnosis not present

## 2015-03-10 ENCOUNTER — Encounter: Payer: Self-pay | Admitting: Internal Medicine

## 2015-03-10 ENCOUNTER — Ambulatory Visit (INDEPENDENT_AMBULATORY_CARE_PROVIDER_SITE_OTHER): Payer: Medicare Other | Admitting: Internal Medicine

## 2015-03-10 VITALS — BP 118/70 | HR 103 | Ht 73.0 in | Wt 290.6 lb

## 2015-03-10 DIAGNOSIS — J9610 Chronic respiratory failure, unspecified whether with hypoxia or hypercapnia: Secondary | ICD-10-CM

## 2015-03-10 DIAGNOSIS — Z930 Tracheostomy status: Secondary | ICD-10-CM | POA: Diagnosis not present

## 2015-03-10 NOTE — Patient Instructions (Addendum)
ICD-9-CM ICD-10-CM   1. Chronic respiratory failure, unspecified whether with hypoxia or hypercapnia 518.83 J96.10   2. Tracheostomy status V44.0 Z93.0    Please await a call from [place] office - if you do not hear from them in 1 week call them Plan is to cap your tracheostomy for few weeks and then assess for decannulation  Followup  3-4 weeks from now with myself or Tammy PArrett  - we might have to admit you  Overnight for few days to ensure decannulation (after capping) going well

## 2015-03-10 NOTE — Progress Notes (Signed)
Subjective:    Patient ID: [name] [name], male    DOB: 1951-01-17, 64 y.o.   MRN: [id number]  HPI    HPI 01/23/11- COPD, CAD/diastolic dysfunction, OSA, chronic respiratory failure, obesity Hypoventilation, cor pulmonale Hosp since last here- once for "shakes"-they told him wasn't using CPAP right, once for epistaxis, once to place urinary stent, and has had a right ? renal biopsy. Told anemic.  Breathing stable, but can't lie flat- feels smothered even on O2.  Runs O2 2-3L/M usually. I gave permision to go to 4 during exertion. Notes "tiresome" feeling midchest, related to exertion/ climbing stairs and relieved by rest. Told to pace himself and not try to push through that.  Discussed need for O2. He doesn't sleep well with his BiPAP 15/12. Discussed sleep hygiene- discussed room temperature. He was more comfortable with autotitration in hosp and we discussed a trial of that at home.  Has restarted diuretic since leg edema has come back.   05/25/11-  COPD, CAD/diastolic dysfunction, OSA, chronic respiratory failure, obesity Hypoventilation, cor pulmonale Went to ER last week- short of breath. Air wasn't satisfying. Was started back on shot for anemia. Feels some better.  Reports sneezing, postnasal drainage and a sense of mucus in his upper chest. Denies fever, sore throat and doesn't think he has a cold.  06/28/11-  COPD, CAD/diastolic dysfunction, OSA, chronic respiratory failure, obesity Hypoventilation, cor pulmonale Recently hospitalized October 25-29, notes reviewed with him and x-ray images reviewed by me. He was hospitalized for hypercapnic respiratory failure with transient encephalopathy and renal insufficiency. Since discharge he has regained some ankle edema while off of furosemide. He has a nephrology appointment later this month. He is concerned that he was taken off his diabetes medicines and is directed to discuss this with his primary physician immediately. He has some  persistent soreness across his anterior chest wall at the level of the xiphoid but otherwise breathing better with less exertional dyspnea and before he went in the hospital. He remains dependent on continuous oxygen at 2 L. He continues his BiPAP machine all night, every night but says he was sleeping better with it  on autotitration with a lower pressure used in the hospital.  09/25/11- COPD, CAD/diastolic dysfunction, OSA, chronic respiratory failure, obesity Hypoventilation, cor pulmonale Since last here he was hospitalized for respiratory failure with CHF and obesity hypoventilation. Better with diuresis. BiPAP 13/10 is now comfortable and used all night every night with supplemental oxygen 4 L. Tolerated colonoscopy without respiratory distress. Uses a rescue inhaler only occasionally but says it does help then.  11/23/11- COPD, CAD/diastolic dysfunction, OSA, chronic respiratory failure, obesity Hypoventilation, cor pulmonale She is noticing some increased shortness of breath on exertion such as getting in the car but no increase in ankle edema which is well controlled. Occasional cough is not progressive or productive. Noticing more rhinorrhea with the pollen. Continues BiPAP  I 13/E 10 all night every night.  02/07/12- COPD, CAD/diastolic dysfunction, OSA, chronic respiratory failure, obesity Hypoventilation, cor pulmonale  pt states doing some better.wakes up out of a nap coughing feels like he's choking.  Post Hosp- 01/14/12- 01/23/12-discharge diagnoses reviewed with him: Coronary artery disease/CABG, diastolic dysfunction, COPD with chronic respiratory failure, acute respiratory failure with hypoxia, acute on chronic renal failure, DM. He was intubated for 4 days and treated with Zosyn, vancomycin and Avelox, Levaquin, Zithromax and Rocephin. Cultures negative. He was to wean off of prednisone over 3 days. He was hoarse after intubation but that  has resolved. He is using oxygen 1-2 L and BiPAP   13/10 at night. He feels he is pretty close to his baseline.  03/28/12-  COPD, CAD/diastolic dysfunction/ chronic CHF, OSA, chronic respiratory failure, obesity Hypoventilation, cor pulmonale  6 MWT today. Pt states breathing has been "okay" since last OV. Pt states having trouble falling alseep-not sleeping well on BiPAP. Pt states mask fit well but thinks the pressure needs to be adjusted again. Currently 13/10+ oxygen at 2 L/ Advanced. He sleeps better in a recliner with oxygen but without his BiPAP. Getting iron injections for anemia. We discussed shortness of breath and anemia. Pain left anterior axillary line described as a sore ache over the past week. He is able to raise his arm. Thinks he maybe lifted something wrong. COPD assessment test (CAT) 21/40 CXR 01/14/12- 1 view- IMPRESSION:  Stable support apparatus. Residual mild interstitial prominence  with slight improvement in aeration. No convincing pulmonary  edema. Probable small left pleural effusion with left basilar  atelectasis or infiltrate.  Original Report Authenticated By: [name], M.D.   02/03/28-NLGX, CAD/diastolic dysfunction/ chronic CHF, OSA, chronic respiratory failure, obesity Hypoventilation, cor pulmonale Hospitalized again between March 9 and 12 for acute on chronic renal failure with congestive heart failure, chronic respiratory failure on BiPAP and acute respiratory failure with hypoxia. He says he is feeling better at this time. Continues oxygen 3 L/Advanced. He has been using BiPAP 13/10 but thinks he slept better with CPAP. We discussed options. Usually when he decompensates it is from fluid overload triggering CO2 retention and hypoventilation.  CXR 10/20/12 IMPRESSION:  Vascular congestion and cardiomegaly, with small bilateral pleural  effusions and bibasilar airspace opacification. Findings are most  compatible with recurrent pulmonary edema, though underlying  pneumonia cannot be excluded.  Original Report  Authenticated By: [name], M.D.  12/02/2012 [facility] follow up  Returns for a post hospital follow up .  Reports doing well overall but still having some SOB, thinks this may be due to his pressure not being high enough on BIPAP. Since decreasing BIPAP 1 month ago  down to 12/10 he does not feel as good. Trouble sleeping.  Was admitted for decompensated cor pulmonale w/ fluid overload with subsequent acute on chronic hypercarbic resp failure.  He admits he can not afford his advair and spiriva- therefore he is not taking.  He does not wear BIPAP everynight. We discussed the implications of this and untreated OHS/OSA. He naps a lot but does not wear BIPAP.  He was treated with diuresis with improvement.  Discharged on Lasix 40mg  . Lower dose due to bump in scr. Has OP follow up with Renal d/t renal insufficiency.   02/05/13- COPD, CAD/diastolic dysfunction/ chronic CHF, OSA, chronic respiratory failure, obesity Hypoventilation, cor pulmonale Hosp 6/16-6/19/14 Acute on Chronic Resp Failure Discharge Plan:  -pt educated on BiPAP compliance at home  -continue pulmicort, scheduled albuterol / atrovent (med cost has been an issue)  -wean prednisone to off  -given information for [employer] at [employer]. Pulmonary Rehab was a cost issue (45$ per visit)  -RN home health to review medications and reinforce need for BiPAP 12/12 wO2 2-3L for sleep, O2 3-4 awake.  FOLLOWS FOR: Breathing has improved since leaving the hospital. Reports DOE, chest tightness with exertion and slight coughing from time to time. Wife needs FMLA form. CXR 01/29/13 1View IMPRESSION:  Slightly improved aeration in the lungs may represent decreasing  edema.  Original Report Authenticated By: Markus Daft, M.  04/11/13- COPD, CAD/diastolic dysfunction/ chronic CHF, OSA, chronic respiratory failure, obesity Hypoventilation, cor pulmonale FOLLOWS FOR: reports breathing is essentially unchanged but he feels like he's  "burning through more oxygen than I used to." denies wheezing, increased SOB, tightness, increased cough, f/c/s Just in hospital again acute on chronic respiratory failure with CHF and COPD. Today he feels well except for frontal headache. Continuing oxygen at 2.5 L. BIPAP Auto 5-15/ O2 2L/ Advanced CXR 1V 8/14 IMPRESSION:  1. Extubated, enteric tube removed. Stable lung volumes.  2. Stable ventilation. Small pleural effusions.  Electronically Signed  By: Lars Pinks  On: 04/02/2013 07:43  06/23/2013 [facility] follow up  Patient presents for a post hospital followup. Patient was admitted October 7 through October 30 for acute on chronic respiratory failure. Patient did require intubation for airway protection in the emergency room due to acute encephalopathy. Unfortunately, patient was unable to be weaned from the ventilator and underwent a tracheostomy. Patient was weaned to trach collar during the daytime and vent support. At bedtime. Lurline Idol was placed by ENT Dr. Redmond Baseman. He was transitioned to an extra long #6 trach prior to discharge.  Patient returns today accompanied by his wife. Patient reports that he has been doing okay. He strength. He is tolerating vent support at night. However, he does feel that this is uncomfortable in certain times. Vent settings at night are PRVC at a rate of 14 with a tidal volume of 450 cc with FiO2 of 40%, and a PEEP of 5. Patient has been using Passy-Muir valve for speech however, over the last couple days, feels it's been more difficulties than his usual. He does have follow with ENT later this week for trach evaluation. Patient denies any hemoptysis, trach site. Bleeding, Orthopnea, PND, or increased leg swelling. Has been eating okay without any noted , dysphagia. Appetite is decreased with no nausea, vomiting, diarrhea.  REC Continue on current regimen.  follow up with ENT this week for [facility] evaluation.  I will call with labs and xray results.    Continue on Trach Collar during daytime  Continue on Vent support At bedtime   Follow up Dr. Chase Caller in 6-8 weeks and As needed .  Please contact office for sooner follow up if symptoms do not improve or worsen or seek emergency care   Late Add : ? Right sided infiltrate  Add Levaquin 500mg  daily x 7 days #7 , no refills.    OV 07/21/2013 Followup chronic respiratory failure status post tracheostomy. Since seeing my nurse practitioner a month ago he is doing well. Uses ventilator at night and daytime is on tracheostomy collar. He is not compliant with his Passy-Muir valve because it gives him a sensation of suffocation. But he is able to talk with open tracheostomy. He still reports chronic stable dyspnea on exertion for walking a block or sometimes half a block only. This is relieved by rest. Rated as moderate. There is no associated chest pain no worsening cough or phlegm. He did have some hemoptysis yesterday and he is trying to get in contact with the ENT specialist. As noted just beside bleeding. He denies any orthopnea or paroxysmal nocturnal dyspnea. Eating okay.  Lab review I do note that is not had a CT scan of the chest since 2012 rec Glad you are doing well CMA will refer you to pumonary rehab on basis of chronic resp failure Please do CT scan chest for pleural fibrosis, pleural plaques Followup with ENT> Dr Redmond Baseman, changed trach  on 12/15  07/31/2013 f/u ov/Wert re: worse since 07/24/13 (day of CT Chest - see below) Chief Complaint  Patient presents with  . Acute Visit    Pt c/o increased SOB for the past several days. He states gets out of breath with any sort of exertion.  He has minimal cough that is non prod.   mucus is clear/ not bloody Baseline use of alb not much at all,  One every few days at most Assoc with increased fluid in legs  But followed by Gwenlyn Found who adjusts his lasix  No obvious patterns in day to day or daytime variabilty or assoc chronic cough or cp or  chest tightness, subjective wheeze overt sinus or hb symptoms. No unusual exp hx or h/o childhood pna/ asthma or knowledge of premature birth.  Sleeping ok without nocturnal  or early am exacerbation  of respiratory  c/o's or need for noct saba. Also denies any obvious fluctuation of symptoms with weather or environmental changes or other aggravating or alleviating factors except as outlined above    09/15/2013 Follow up  Returns for a 2 month follow up for COPD, chronic resp failure -trach dependent.  Reports breathing has worsened x2 weeks w/ increased SOB, chest tightness, cough with white mucus. Coughing and congestion make it difficult to use vent at night . Denies any f/c/s, nausea, vomiting, hemoptysis, edema.  Was referred to pulmonary rehab however could not afford co-pay amounts. We discussed looking into pt assistance.    OV 10/28/2013  Chief Complaint  Patient presents with  . Follow-up    Pt is c/o rattling in his chest, and dry cough. Pt also c/o increased swelling in both ankles.     Followup chronic respiratory failure status post tracheostomy by ENT. Chronic respiratory failure on account of COPD, OSA, diastolic heart failure  - Overall he is stable. He reports using the ventilator at night but occasionally he is noncompliant. When he does not use the ventilator the next day he is more fatigued according to his wife. In the daytime he is on trach collar on room air of liters of oxygen. He occasionally uses his Passy-Muir valve for talk but otherwise feels a sense of suffocation. The last few weeks he's noticed some increased pedal edema and despite increasing and self titrating his Lasix. In association with this he is more rattling in his chest and more shortness of breath and cough but there is no change in sputum color. The sputum is not pink and frothy. He has not seen his cardiologist Dr.? Dr Adora Fridge a while and he plans to see him more expeditiously. Denies any chest pain  cough fever chills or wheezing.  Copd appears stable However, diastolic chf might be worse My office will help you seen by Dr Gwenlyn Found office next day or two  REturn to see me in 4 months or sooner if needed  - continue trach care through ENT clinic   OV 02/24/2014  Chief Complaint  Patient presents with  . Follow-up    Pt c/o increase SOB, cough with clear mucous, intermittent edema in BIL ankles. C/o upper mid sternal CP and left upper CP when on ventilator.    Followup chronic respiratory failure on account of obesity, sleep apnea, COPD and diastolic heart failure  - He continues to feel poorly. For the last month or so he produces increased shortness of breath associated with worsening pedal edema. Is only on Lasix 40 mg once daily. He says he  is compliant with his nebulizers. Although, he is noncompliant with his ventilator stating that the pressures are too high. He probably uses it less than 50% of the time. In addition he is very keen on getting his tracheostomy removed permanently this is despite our advice that he keep it in permanently. He is frustrated by his quality of life. I've explained to him that weight loss is paramount but he is not too keen on this. He reports that his cough, sputum or in sputum color or roughly the same as baseline without any fever >>pred taper , increased lasix 40mg  Twice daily    03/10/14 Follow up COPD/OHS/ Trach Depend RF /Noct Vent Returns for follow up for COPD . Seen last ov with COPD flare and suspected decompensated Diastolic CHF. He was treated with prednisone taper and diuretics were increased.  He says he is some better but gets winded easily.  Encouraged to use vent at night .  Leg swelling slightly better. Labs last week showed scr stable.  No chest pain , hemoptyiss, fever or n/vd/     O 05/12/2014  Chief Complaint  Patient presents with  . Follow-up    Pt states his breathing has slightly worsened since last OV. Pt states he has a  slight increase in SOB, non prod cough and chest pain when coming off the vent in the morning.    Followup chronic respiratory failure on account of obesity, sleep apnea, COPD and diastolic heart failure and Trach Depend RF /Noct Vent   - presents with wife. Continues to report frustration having trach in life; more resigned to having it. He is frustrated by class 3 exertional fatigue and dyspnea. REcently hgb 6.3gm% at renal; no obvious bleed and renal managing it but dyspnea is chronic. Noc hange in baseline cough and white sputum of moderate intensity. Continues vent at night; more compliant.  Uptodate with flu shot. He has cards appt pending in  <  2 months per hx   12/10/2014 [facility] follow up  Patient presents for a post hospital follow-up Patient was admitted April 5 through April 7 for pneumonia and acute on chronic hypercarbic respiratory failure Influenza panel was negative. He was treated with IV antibiotics, steroids, and nebulized bronchodilators. CT chest showed a right middle lobe pneumonia. Patient remains on trach collar 4.5L  during the daytime with vent support at night. He says since discharge he is feeling some better. He still continues to feel weak. He denies any hemoptysis, orthopnea, PND or leg swelling Seen by ENT with trach change  Last week.  Remains on Pulmicort and brovana nebs.  Wt is down from discharge (~6 lbs ) , decreased leg swelling.  Does tell me he turns up O2 to 6-7 L when he feels sob even though O2 sats >90%.  . We discussed O2 sats gaol of 90-92%.    01/22/2015 Follow up: COPD /trach dependent , noct vent  Returns for 2 week follow up.  Patient was given Cipro last visit for persistent cough and congestion. Last ov lasix increased for fluid overload.  Reports breathing is improved since last ov, cough is now clear, still having some occasional tightness when coming off the vent. Overall feels better.   CXR shows slight improvement in  aeration today  Wt is down .Leg swelling is less.   Going to ENT to discuss trach options-would prefer to not have trach.   He denies any chest pain, orthopnea, PND or increased  leg swelling  OV 03/10/2015  Chief Complaint  Patient presents with  . Follow-up    Still having SOB and coughing up mostly clear thick mucus with occ green mucus.     Followup chronic respiratory failure on account of obesity, sleep apnea, COPD and diastolic heart failure   Is improved from his recent exacerbation with H. influenzae.. Currently he is here with his wife. Both at test that he is doing well. He uses nocturnal ventilation. Daytime tracheostomy with Passy-Muir valve. He is very adamant that he wants to get his tracheostomy removed no matter what the consequences are. Currently feeling fine. Baseline chronic venous stasis edema present. There are no acute complaints.    Current outpatient prescriptions:  .  acetaminophen (TYLENOL) 500 MG tablet, Take 500 mg by mouth every 6 (six) hours as needed for mild pain or moderate pain., Disp: , Rfl:  .  albuterol (PROVENTIL) (2.5 MG/3ML) 0.083% nebulizer solution, Take 3 mLs (2.5 mg total) by nebulization every 4 (four) hours as needed for wheezing., Disp: 360 mL, Rfl: 5 .  allopurinol (ZYLOPRIM) 100 MG tablet, Take 1 tablet by mouth daily., Disp: , Rfl:  .  arformoterol (BROVANA) 15 MCG/2ML NEBU, Take 2 mLs (15 mcg total) by nebulization 2 (two) times daily., Disp: 120 mL, Rfl: 2 .  aspirin 81 MG tablet, Take 81 mg by mouth daily., Disp: , Rfl:  .  budesonide (PULMICORT) 0.5 MG/2ML nebulizer solution, Take 0.5 mg by nebulization 2 (two) times daily., Disp: , Rfl:  .  clopidogrel (PLAVIX) 75 MG tablet, Take 1 tablet (75 mg total) by mouth daily., Disp: 30 tablet, Rfl: 10 .  COLCRYS 0.6 MG tablet, Take 0.6 mg by mouth daily. , Disp: , Rfl:  .  dextromethorphan-guaiFENesin (MUCINEX DM) 30-600 MG per 12 hr tablet, Take 1 tablet by mouth 2 (two) times  daily., Disp: 10 tablet, Rfl: 0 .  furosemide (LASIX) 40 MG tablet, Take 1 tablet (40 mg total) by mouth 2 (two) times daily., Disp: 90 tablet, Rfl: 3 .  metolazone (ZAROXOLYN) 2.5 MG tablet, TAKE ONE TABLET BY MOUTH ONCE DAILY ON  MONDAY,  WEDNESDAY,  AND  FRIDAY  30  MINUTES  BEFORE  LASIX, Disp: 30 tablet, Rfl: 5 .  nitroGLYCERIN (NITROSTAT) 0.4 MG SL tablet, Place 1 tablet (0.4 mg total) under the tongue every 5 (five) minutes as needed. For chest pain, Disp: 25 tablet, Rfl: 6   Review of Systems  Constitutional: Negative for fever and unexpected weight change.  HENT: Negative for congestion, dental problem, ear pain, nosebleeds, postnasal drip, rhinorrhea, sinus pressure, sneezing, sore throat and trouble swallowing.   Eyes: Negative for redness and itching.  Respiratory: Negative for cough, chest tightness, shortness of breath and wheezing.   Cardiovascular: Negative for palpitations and leg swelling.  Gastrointestinal: Negative for nausea and vomiting.  Genitourinary: Negative for dysuria.  Musculoskeletal: Negative for joint swelling.  Skin: Negative for rash.  Neurological: Negative for headaches.  Hematological: Does not bruise/bleed easily.  Psychiatric/Behavioral: Negative for dysphoric mood. The patient is not nervous/anxious.        Objective:   Physical Exam  Constitutional: He is oriented to person, place, and time. He appears well-developed and well-nourished. No distress.  Body mass index is 38.35 kg/(m^2). He looks better than before  HENT:  Head: Normocephalic and atraumatic.  Right Ear: External ear normal.  Left Ear: External ear normal.  Mouth/Throat: Oropharynx is clear and moist. No oropharyngeal exudate.  Chronic tracheostomy Talking through Passy-Muir valve  site looks healthy  Eyes: Conjunctivae and EOM are normal. Pupils are equal, round, and reactive to light. Right eye exhibits no discharge. Left eye exhibits no discharge. No scleral icterus.  Neck:  Normal range of motion. Neck supple. No JVD present. No tracheal deviation present. No thyromegaly present.  Cardiovascular: Normal rate, regular rhythm and intact distal pulses.  Exam reveals no gallop and no friction rub.   No murmur heard. Pulmonary/Chest: Effort normal and breath sounds normal. No respiratory distress. He has no wheezes. He has no rales. He exhibits no tenderness.  Abdominal: Soft. Bowel sounds are normal. He exhibits no distension and no mass. There is no tenderness. There is no rebound and no guarding.  Musculoskeletal: Normal range of motion. He exhibits edema. He exhibits no tenderness.  Chronic venous stasis edema  Lymphadenopathy:    He has no cervical adenopathy.  Neurological: He is alert and oriented to person, place, and time. He has normal reflexes. No cranial nerve deficit. Coordination normal.  Skin: Skin is warm and dry. No rash noted. He is not diaphoretic. No erythema. No pallor.  Psychiatric: He has a normal mood and affect. His behavior is normal. Judgment and thought content normal.  Nursing note and vitals reviewed.    Filed Vitals:   03/10/15 1411  BP: 118/70  Pulse: 103  Height: 6\' 1"  (1.854 m)  Weight: 290 lb 9.6 oz (131.815 kg)  SpO2: 97%        Assessment & Plan:     ICD-9-CM ICD-10-CM   1. Chronic respiratory failure, unspecified whether with hypoxia or hypercapnia 518.83 J96.10   2. Tracheostomy status V44.0 Z93.0     I discussed with Dr. Melida Quitter his ENT surgeon who did a tracheostomy. The concern is that he might decompensate respiratory wise over time as supposed acutely following decannulation. Patient understands the consequences and wants to decannulate. Dr. Redmond Baseman and I discussed consideration of decannulation as an inpatient but we thought that maybe try capping as an outpatient as a first step. Dr. Redmond Baseman is on vacation right now but will call his office and arrange for supplies for capping. In the pulmonary office will see  him in 3-4 weeks. Hopefully by then the capping is in progress. Maybe at that point in time admit him to the pulmonary critical care service for decannulation and transition to a nocturnal BiPAP/CPAP but she was using in the past for sleep apnea (we will need to find out what his original settings where from chart review]  Patient and wife are agreeable with the plan   > 50% of this > 25 min visit spent in face to face counseling or coordination of care    Dr. Brand Males, M.D., [facility].C.P Pulmonary and Critical Care Medicine Staff Physician [facility] Pager: [phone], If no answer or between  15:00h - 7:00h: call [phone]  03/10/2015 2:48 PM

## 2015-03-12 ENCOUNTER — Encounter (HOSPITAL_COMMUNITY)
Admission: RE | Admit: 2015-03-12 | Discharge: 2015-03-12 | Disposition: A | Discharge status: Home / Self Care | Payer: Medicare Other | Source: Ambulatory Visit | Attending: Nephrology | Admitting: Nephrology

## 2015-03-12 DIAGNOSIS — D631 Anemia in chronic kidney disease: Secondary | ICD-10-CM | POA: Diagnosis not present

## 2015-03-12 DIAGNOSIS — N183 Chronic kidney disease, stage 3 (moderate): Secondary | ICD-10-CM | POA: Diagnosis not present

## 2015-03-12 LAB — POCT HEMOGLOBIN-HEMACUE: Hemoglobin: 8.1 g/dL — ABNORMAL LOW (ref 13.0–17.0)

## 2015-03-12 MED ORDER — DARBEPOETIN ALFA 200 MCG/0.4ML IJ SOSY
200.0000 ug | PREFILLED_SYRINGE | INTRAMUSCULAR | Status: DC
  Administered 2015-03-12: 200 ug via SUBCUTANEOUS

## 2015-03-12 MED ORDER — DARBEPOETIN ALFA 200 MCG/0.4ML IJ SOSY
PREFILLED_SYRINGE | INTRAMUSCULAR | Status: AC
  Filled 2015-03-12: qty 0.4

## 2015-03-13 DIAGNOSIS — J9610 Chronic respiratory failure, unspecified whether with hypoxia or hypercapnia: Secondary | ICD-10-CM | POA: Diagnosis not present

## 2015-03-24 ENCOUNTER — Encounter (HOSPITAL_COMMUNITY)
Admission: RE | Admit: 2015-03-24 | Discharge: 2015-03-24 | Disposition: A | Discharge status: Home / Self Care | Payer: Medicare Other | Source: Ambulatory Visit | Attending: Nephrology | Admitting: Nephrology

## 2015-03-24 DIAGNOSIS — D631 Anemia in chronic kidney disease: Secondary | ICD-10-CM | POA: Insufficient documentation

## 2015-03-24 DIAGNOSIS — N183 Chronic kidney disease, stage 3 (moderate): Secondary | ICD-10-CM | POA: Diagnosis not present

## 2015-03-24 LAB — RENAL FUNCTION PANEL
Albumin: 3.5 g/dL (ref 3.5–5.0)
Anion gap: 8 (ref 5–15)
BUN: 46 mg/dL — ABNORMAL HIGH (ref 6–20)
CALCIUM: 9.1 mg/dL (ref 8.9–10.3)
CO2: 46 mmol/L — AB (ref 22–32)
Chloride: 85 mmol/L — ABNORMAL LOW (ref 101–111)
Creatinine, Ser: 1.82 mg/dL — ABNORMAL HIGH (ref 0.61–1.24)
GFR calc non Af Amer: 38 mL/min — ABNORMAL LOW (ref >60)
GFR, EST AFRICAN AMERICAN: 44 mL/min — AB (ref >60)
Glucose, Bld: 118 mg/dL — ABNORMAL HIGH (ref 65–99)
Phosphorus: 3.2 mg/dL (ref 2.5–4.6)
Potassium: 3.7 mmol/L (ref 3.5–5.1)
SODIUM: 139 mmol/L (ref 135–145)

## 2015-03-24 LAB — POCT HEMOGLOBIN-HEMACUE: Hemoglobin: 8.5 g/dL — ABNORMAL LOW (ref 13.0–17.0)

## 2015-03-24 MED ORDER — DARBEPOETIN ALFA 200 MCG/0.4ML IJ SOSY
200.0000 ug | PREFILLED_SYRINGE | INTRAMUSCULAR | Status: DC
  Administered 2015-03-24: 200 ug via SUBCUTANEOUS

## 2015-03-24 MED ORDER — DARBEPOETIN ALFA 200 MCG/0.4ML IJ SOSY
PREFILLED_SYRINGE | INTRAMUSCULAR | Status: AC
  Filled 2015-03-24: qty 0.4

## 2015-03-25 DIAGNOSIS — E662 Morbid (severe) obesity with alveolar hypoventilation: Secondary | ICD-10-CM | POA: Diagnosis not present

## 2015-03-25 DIAGNOSIS — Z930 Tracheostomy status: Secondary | ICD-10-CM | POA: Diagnosis not present

## 2015-03-25 DIAGNOSIS — J449 Chronic obstructive pulmonary disease, unspecified: Secondary | ICD-10-CM | POA: Diagnosis not present

## 2015-03-25 DIAGNOSIS — G4733 Obstructive sleep apnea (adult) (pediatric): Secondary | ICD-10-CM | POA: Diagnosis not present

## 2015-04-06 DIAGNOSIS — R0602 Shortness of breath: Secondary | ICD-10-CM | POA: Diagnosis not present

## 2015-04-07 ENCOUNTER — Ambulatory Visit: Payer: Medicare Other | Admitting: Adult Health

## 2015-04-08 ENCOUNTER — Other Ambulatory Visit (HOSPITAL_COMMUNITY): Payer: Self-pay | Admitting: *Deleted

## 2015-04-09 ENCOUNTER — Encounter (HOSPITAL_COMMUNITY): Payer: Medicare Other

## 2015-04-09 ENCOUNTER — Encounter (HOSPITAL_COMMUNITY)
Admission: RE | Admit: 2015-04-09 | Discharge: 2015-04-09 | Disposition: A | Discharge status: Home / Self Care | Payer: Medicare Other | Source: Ambulatory Visit | Attending: Nephrology | Admitting: Nephrology

## 2015-04-09 DIAGNOSIS — N183 Chronic kidney disease, stage 3 (moderate): Secondary | ICD-10-CM | POA: Diagnosis not present

## 2015-04-09 DIAGNOSIS — D631 Anemia in chronic kidney disease: Secondary | ICD-10-CM | POA: Diagnosis not present

## 2015-04-09 LAB — POCT HEMOGLOBIN-HEMACUE: HEMOGLOBIN: 7.8 g/dL — AB (ref 13.0–17.0)

## 2015-04-09 MED ORDER — DARBEPOETIN ALFA 200 MCG/0.4ML IJ SOSY
PREFILLED_SYRINGE | INTRAMUSCULAR | Status: AC
  Filled 2015-04-09: qty 0.4

## 2015-04-09 MED ORDER — DARBEPOETIN ALFA 200 MCG/0.4ML IJ SOSY
200.0000 ug | PREFILLED_SYRINGE | INTRAMUSCULAR | Status: DC
  Administered 2015-04-09: 200 ug via SUBCUTANEOUS

## 2015-04-09 MED ORDER — SODIUM CHLORIDE 0.9 % IV SOLN
510.0000 mg | INTRAVENOUS | Status: DC
  Administered 2015-04-09: 510 mg via INTRAVENOUS
  Filled 2015-04-09: qty 17

## 2015-04-13 DIAGNOSIS — J9610 Chronic respiratory failure, unspecified whether with hypoxia or hypercapnia: Secondary | ICD-10-CM | POA: Diagnosis not present

## 2015-04-14 ENCOUNTER — Other Ambulatory Visit (HOSPITAL_COMMUNITY): Payer: Self-pay | Admitting: *Deleted

## 2015-04-14 ENCOUNTER — Encounter: Payer: Self-pay | Admitting: Adult Health

## 2015-04-14 ENCOUNTER — Ambulatory Visit (INDEPENDENT_AMBULATORY_CARE_PROVIDER_SITE_OTHER): Payer: Medicare Other | Admitting: Adult Health

## 2015-04-14 VITALS — BP 126/74 | HR 99 | Temp 98.7°F | Ht 73.0 in | Wt 282.0 lb

## 2015-04-14 DIAGNOSIS — Z930 Tracheostomy status: Secondary | ICD-10-CM

## 2015-04-14 DIAGNOSIS — J449 Chronic obstructive pulmonary disease, unspecified: Secondary | ICD-10-CM

## 2015-04-14 DIAGNOSIS — J9611 Chronic respiratory failure with hypoxia: Secondary | ICD-10-CM

## 2015-04-14 MED ORDER — DOXYCYCLINE HYCLATE 100 MG PO TABS: 100.0000 mg | ORAL_TABLET | Freq: Two times a day (BID) | ORAL | Status: DC

## 2015-04-14 MED ORDER — PREDNISONE 10 MG PO TABS: ORAL_TABLET | ORAL | Status: DC

## 2015-04-14 NOTE — Assessment & Plan Note (Addendum)
Refer to ENT for evaluation for trach capping  If does well for 4 weeks then consider decanulation -inpatient although high risk for decompensation.

## 2015-04-14 NOTE — Assessment & Plan Note (Signed)
Cont w/ trach collar daytime and vent at night  Has referral with ENT to consider trach capping .  Will most likely need O2 via [place] with vent support At bedtime

## 2015-04-14 NOTE — Patient Instructions (Signed)
Doxycycline 100mg  Twice daily  For 7 days  Mucinex DM Twice daily  As needed  Cough/congestion  Prednisone taper over next week.  Follow up with ENT as discussed  Continue on Trach Collar during daytime  Continue on Vent support At bedtime   Follow up Dr. Chase Caller in  6-8 weeks and As needed .  Please contact office for sooner follow up if symptoms do not improve or worsen or seek emergency care

## 2015-04-14 NOTE — Assessment & Plan Note (Signed)
Flare   Plan  Doxycycline 100mg  Twice daily  For 7 days  Mucinex DM Twice daily  As needed  Cough/congestion  Prednisone taper over next week.  Follow up Dr. Chase Caller in  6-8 weeks and As needed .  Please contact office for sooner follow up if symptoms do not improve or worsen or seek emergency care

## 2015-04-14 NOTE — Progress Notes (Signed)
Subjective:    Patient ID: [name] [name], male    DOB: 1951-01-17, 64 y.o.   MRN: [id number]  HPI    HPI 01/23/11- COPD, CAD/diastolic dysfunction, OSA, chronic respiratory failure, obesity Hypoventilation, cor pulmonale Hosp since last here- once for "shakes"-they told him wasn't using CPAP right, once for epistaxis, once to place urinary stent, and has had a right ? renal biopsy. Told anemic.  Breathing stable, but can't lie flat- feels smothered even on O2.  Runs O2 2-3L/M usually. I gave permision to go to 4 during exertion. Notes "tiresome" feeling midchest, related to exertion/ climbing stairs and relieved by rest. Told to pace himself and not try to push through that.  Discussed need for O2. He doesn't sleep well with his BiPAP 15/12. Discussed sleep hygiene- discussed room temperature. He was more comfortable with autotitration in hosp and we discussed a trial of that at home.  Has restarted diuretic since leg edema has come back.   05/25/11-  COPD, CAD/diastolic dysfunction, OSA, chronic respiratory failure, obesity Hypoventilation, cor pulmonale Went to ER last week- short of breath. Air wasn't satisfying. Was started back on shot for anemia. Feels some better.  Reports sneezing, postnasal drainage and a sense of mucus in his upper chest. Denies fever, sore throat and doesn't think he has a cold.  06/28/11-  COPD, CAD/diastolic dysfunction, OSA, chronic respiratory failure, obesity Hypoventilation, cor pulmonale Recently hospitalized October 25-29, notes reviewed with him and x-ray images reviewed by me. He was hospitalized for hypercapnic respiratory failure with transient encephalopathy and renal insufficiency. Since discharge he has regained some ankle edema while off of furosemide. He has a nephrology appointment later this month. He is concerned that he was taken off his diabetes medicines and is directed to discuss this with his primary physician immediately. He has some  persistent soreness across his anterior chest wall at the level of the xiphoid but otherwise breathing better with less exertional dyspnea and before he went in the hospital. He remains dependent on continuous oxygen at 2 L. He continues his BiPAP machine all night, every night but says he was sleeping better with it  on autotitration with a lower pressure used in the hospital.  09/25/11- COPD, CAD/diastolic dysfunction, OSA, chronic respiratory failure, obesity Hypoventilation, cor pulmonale Since last here he was hospitalized for respiratory failure with CHF and obesity hypoventilation. Better with diuresis. BiPAP 13/10 is now comfortable and used all night every night with supplemental oxygen 4 L. Tolerated colonoscopy without respiratory distress. Uses a rescue inhaler only occasionally but says it does help then.  11/23/11- COPD, CAD/diastolic dysfunction, OSA, chronic respiratory failure, obesity Hypoventilation, cor pulmonale She is noticing some increased shortness of breath on exertion such as getting in the car but no increase in ankle edema which is well controlled. Occasional cough is not progressive or productive. Noticing more rhinorrhea with the pollen. Continues BiPAP  I 13/E 10 all night every night.  02/07/12- COPD, CAD/diastolic dysfunction, OSA, chronic respiratory failure, obesity Hypoventilation, cor pulmonale  pt states doing some better.wakes up out of a nap coughing feels like he's choking.  Post Hosp- 01/14/12- 01/23/12-discharge diagnoses reviewed with him: Coronary artery disease/CABG, diastolic dysfunction, COPD with chronic respiratory failure, acute respiratory failure with hypoxia, acute on chronic renal failure, DM. He was intubated for 4 days and treated with Zosyn, vancomycin and Avelox, Levaquin, Zithromax and Rocephin. Cultures negative. He was to wean off of prednisone over 3 days. He was hoarse after intubation but that  has resolved. He is using oxygen 1-2 L and BiPAP   13/10 at night. He feels he is pretty close to his baseline.  03/28/12-  COPD, CAD/diastolic dysfunction/ chronic CHF, OSA, chronic respiratory failure, obesity Hypoventilation, cor pulmonale  6 MWT today. Pt states breathing has been "okay" since last OV. Pt states having trouble falling alseep-not sleeping well on BiPAP. Pt states mask fit well but thinks the pressure needs to be adjusted again. Currently 13/10+ oxygen at 2 L/ Advanced. He sleeps better in a recliner with oxygen but without his BiPAP. Getting iron injections for anemia. We discussed shortness of breath and anemia. Pain left anterior axillary line described as a sore ache over the past week. He is able to raise his arm. Thinks he maybe lifted something wrong. COPD assessment test (CAT) 21/40 CXR 01/14/12- 1 view- IMPRESSION:  Stable support apparatus. Residual mild interstitial prominence  with slight improvement in aeration. No convincing pulmonary  edema. Probable small left pleural effusion with left basilar  atelectasis or infiltrate.  Original Report Authenticated By: [name], M.D.   03/08/35-UYQI, CAD/diastolic dysfunction/ chronic CHF, OSA, chronic respiratory failure, obesity Hypoventilation, cor pulmonale Hospitalized again between March 9 and 12 for acute on chronic renal failure with congestive heart failure, chronic respiratory failure on BiPAP and acute respiratory failure with hypoxia. He says he is feeling better at this time. Continues oxygen 3 L/Advanced. He has been using BiPAP 13/10 but thinks he slept better with CPAP. We discussed options. Usually when he decompensates it is from fluid overload triggering CO2 retention and hypoventilation.  CXR 10/20/12 IMPRESSION:  Vascular congestion and cardiomegaly, with small bilateral pleural  effusions and bibasilar airspace opacification. Findings are most  compatible with recurrent pulmonary edema, though underlying  pneumonia cannot be excluded.  Original Report  Authenticated By: [name], M.D.  12/02/2012 [facility] follow up  Returns for a post hospital follow up .  Reports doing well overall but still having some SOB, thinks this may be due to his pressure not being high enough on BIPAP. Since decreasing BIPAP 1 month ago  down to 12/10 he does not feel as good. Trouble sleeping.  Was admitted for decompensated cor pulmonale w/ fluid overload with subsequent acute on chronic hypercarbic resp failure.  He admits he can not afford his advair and spiriva- therefore he is not taking.  He does not wear BIPAP everynight. We discussed the implications of this and untreated OHS/OSA. He naps a lot but does not wear BIPAP.  He was treated with diuresis with improvement.  Discharged on Lasix 40mg  . Lower dose due to bump in scr. Has OP follow up with Renal d/t renal insufficiency.   02/05/13- COPD, CAD/diastolic dysfunction/ chronic CHF, OSA, chronic respiratory failure, obesity Hypoventilation, cor pulmonale Hosp 6/16-6/19/14 Acute on Chronic Resp Failure Discharge Plan:  -pt educated on BiPAP compliance at home  -continue pulmicort, scheduled albuterol / atrovent (med cost has been an issue)  -wean prednisone to off  -given information for [employer] at [employer]. Pulmonary Rehab was a cost issue (45$ per visit)  -RN home health to review medications and reinforce need for BiPAP 12/12 wO2 2-3L for sleep, O2 3-4 awake.  FOLLOWS FOR: Breathing has improved since leaving the hospital. Reports DOE, chest tightness with exertion and slight coughing from time to time. Wife needs FMLA form. CXR 01/29/13 1View IMPRESSION:  Slightly improved aeration in the lungs may represent decreasing  edema.  Original Report Authenticated By: Markus Daft, M.  04/11/13- COPD, CAD/diastolic dysfunction/ chronic CHF, OSA, chronic respiratory failure, obesity Hypoventilation, cor pulmonale FOLLOWS FOR: reports breathing is essentially unchanged but he feels like he's  "burning through more oxygen than I used to." denies wheezing, increased SOB, tightness, increased cough, f/c/s Just in hospital again acute on chronic respiratory failure with CHF and COPD. Today he feels well except for frontal headache. Continuing oxygen at 2.5 L. BIPAP Auto 5-15/ O2 2L/ Advanced CXR 1V 8/14 IMPRESSION:  1. Extubated, enteric tube removed. Stable lung volumes.  2. Stable ventilation. Small pleural effusions.  Electronically Signed  By: Lars Pinks  On: 04/02/2013 07:43  06/23/2013 [facility] follow up  Patient presents for a post hospital followup. Patient was admitted October 7 through October 30 for acute on chronic respiratory failure. Patient did require intubation for airway protection in the emergency room due to acute encephalopathy. Unfortunately, patient was unable to be weaned from the ventilator and underwent a tracheostomy. Patient was weaned to trach collar during the daytime and vent support. At bedtime. Lurline Idol was placed by ENT Dr. Redmond Baseman. He was transitioned to an extra long #6 trach prior to discharge.  Patient returns today accompanied by his wife. Patient reports that he has been doing okay. He strength. He is tolerating vent support at night. However, he does feel that this is uncomfortable in certain times. Vent settings at night are PRVC at a rate of 14 with a tidal volume of 450 cc with FiO2 of 40%, and a PEEP of 5. Patient has been using Passy-Muir valve for speech however, over the last couple days, feels it's been more difficulties than his usual. He does have follow with ENT later this week for trach evaluation. Patient denies any hemoptysis, trach site. Bleeding, Orthopnea, PND, or increased leg swelling. Has been eating okay without any noted , dysphagia. Appetite is decreased with no nausea, vomiting, diarrhea.  REC Continue on current regimen.  follow up with ENT this week for [facility] evaluation.  I will call with labs and xray results.    Continue on Trach Collar during daytime  Continue on Vent support At bedtime   Follow up Dr. Chase Caller in 6-8 weeks and As needed .  Please contact office for sooner follow up if symptoms do not improve or worsen or seek emergency care   Late Add : ? Right sided infiltrate  Add Levaquin 500mg  daily x 7 days #7 , no refills.    OV 07/21/2013 Followup chronic respiratory failure status post tracheostomy. Since seeing my nurse practitioner a month ago he is doing well. Uses ventilator at night and daytime is on tracheostomy collar. He is not compliant with his Passy-Muir valve because it gives him a sensation of suffocation. But he is able to talk with open tracheostomy. He still reports chronic stable dyspnea on exertion for walking a block or sometimes half a block only. This is relieved by rest. Rated as moderate. There is no associated chest pain no worsening cough or phlegm. He did have some hemoptysis yesterday and he is trying to get in contact with the ENT specialist. As noted just beside bleeding. He denies any orthopnea or paroxysmal nocturnal dyspnea. Eating okay.  Lab review I do note that is not had a CT scan of the chest since 2012 rec Glad you are doing well CMA will refer you to pumonary rehab on basis of chronic resp failure Please do CT scan chest for pleural fibrosis, pleural plaques Followup with ENT> Dr Redmond Baseman, changed trach  on 12/15  07/31/2013 f/u ov/Wert re: worse since 07/24/13 (day of CT Chest - see below) Chief Complaint  Patient presents with  . Acute Visit    Pt c/o increased SOB for the past several days. He states gets out of breath with any sort of exertion.  He has minimal cough that is non prod.   mucus is clear/ not bloody Baseline use of alb not much at all,  One every few days at most Assoc with increased fluid in legs  But followed by Gwenlyn Found who adjusts his lasix  No obvious patterns in day to day or daytime variabilty or assoc chronic cough or cp or  chest tightness, subjective wheeze overt sinus or hb symptoms. No unusual exp hx or h/o childhood pna/ asthma or knowledge of premature birth.  Sleeping ok without nocturnal  or early am exacerbation  of respiratory  c/o's or need for noct saba. Also denies any obvious fluctuation of symptoms with weather or environmental changes or other aggravating or alleviating factors except as outlined above    09/15/2013 Follow up  Returns for a 2 month follow up for COPD, chronic resp failure -trach dependent.  Reports breathing has worsened x2 weeks w/ increased SOB, chest tightness, cough with white mucus. Coughing and congestion make it difficult to use vent at night . Denies any f/c/s, nausea, vomiting, hemoptysis, edema.  Was referred to pulmonary rehab however could not afford co-pay amounts. We discussed looking into pt assistance.    OV 10/28/2013  Chief Complaint  Patient presents with  . Follow-up    Pt is c/o rattling in his chest, and dry cough. Pt also c/o increased swelling in both ankles.     Followup chronic respiratory failure status post tracheostomy by ENT. Chronic respiratory failure on account of COPD, OSA, diastolic heart failure  - Overall he is stable. He reports using the ventilator at night but occasionally he is noncompliant. When he does not use the ventilator the next day he is more fatigued according to his wife. In the daytime he is on trach collar on room air of liters of oxygen. He occasionally uses his Passy-Muir valve for talk but otherwise feels a sense of suffocation. The last few weeks he's noticed some increased pedal edema and despite increasing and self titrating his Lasix. In association with this he is more rattling in his chest and more shortness of breath and cough but there is no change in sputum color. The sputum is not pink and frothy. He has not seen his cardiologist Dr.? Dr Adora Fridge a while and he plans to see him more expeditiously. Denies any chest pain  cough fever chills or wheezing.  Copd appears stable However, diastolic chf might be worse My office will help you seen by Dr Gwenlyn Found office next day or two  REturn to see me in 4 months or sooner if needed  - continue trach care through ENT clinic   OV 02/24/2014  Chief Complaint  Patient presents with  . Follow-up    Pt c/o increase SOB, cough with clear mucous, intermittent edema in BIL ankles. C/o upper mid sternal CP and left upper CP when on ventilator.    Followup chronic respiratory failure on account of obesity, sleep apnea, COPD and diastolic heart failure  - He continues to feel poorly. For the last month or so he produces increased shortness of breath associated with worsening pedal edema. Is only on Lasix 40 mg once daily. He says he  is compliant with his nebulizers. Although, he is noncompliant with his ventilator stating that the pressures are too high. He probably uses it less than 50% of the time. In addition he is very keen on getting his tracheostomy removed permanently this is despite our advice that he keep it in permanently. He is frustrated by his quality of life. I've explained to him that weight loss is paramount but he is not too keen on this. He reports that his cough, sputum or in sputum color or roughly the same as baseline without any fever >>pred taper , increased lasix 40mg  Twice daily    03/10/14 Follow up COPD/OHS/ Trach Depend RF /Noct Vent Returns for follow up for COPD . Seen last ov with COPD flare and suspected decompensated Diastolic CHF. He was treated with prednisone taper and diuretics were increased.  He says he is some better but gets winded easily.  Encouraged to use vent at night .  Leg swelling slightly better. Labs last week showed scr stable.  No chest pain , hemoptyiss, fever or n/vd/     O 05/12/2014  Chief Complaint  Patient presents with  . Follow-up    Pt states his breathing has slightly worsened since last OV. Pt states he has a  slight increase in SOB, non prod cough and chest pain when coming off the vent in the morning.    Followup chronic respiratory failure on account of obesity, sleep apnea, COPD and diastolic heart failure and Trach Depend RF /Noct Vent   - presents with wife. Continues to report frustration having trach in life; more resigned to having it. He is frustrated by class 3 exertional fatigue and dyspnea. REcently hgb 6.3gm% at renal; no obvious bleed and renal managing it but dyspnea is chronic. Noc hange in baseline cough and white sputum of moderate intensity. Continues vent at night; more compliant.  Uptodate with flu shot. He has cards appt pending in  <  2 months per hx   12/10/2014 [facility] follow up  Patient presents for a post hospital follow-up Patient was admitted April 5 through April 7 for pneumonia and acute on chronic hypercarbic respiratory failure Influenza panel was negative. He was treated with IV antibiotics, steroids, and nebulized bronchodilators. CT chest showed a right middle lobe pneumonia. Patient remains on trach collar 4.5L  during the daytime with vent support at night. He says since discharge he is feeling some better. He still continues to feel weak. He denies any hemoptysis, orthopnea, PND or leg swelling Seen by ENT with trach change  Last week.  Remains on Pulmicort and brovana nebs.  Wt is down from discharge (~6 lbs ) , decreased leg swelling.  Does tell me he turns up O2 to 6-7 L when he feels sob even though O2 sats >90%.  . We discussed O2 sats gaol of 90-92%.    01/22/2015 Follow up: COPD /trach dependent , noct vent  Returns for 2 week follow up.  Patient was given Cipro last visit for persistent cough and congestion. Last ov lasix increased for fluid overload.  Reports breathing is improved since last ov, cough is now clear, still having some occasional tightness when coming off the vent. Overall feels better.   CXR shows slight improvement in  aeration today  Wt is down .Leg swelling is less.   Going to ENT to discuss trach options-would prefer to not have trach.   He denies any chest pain, orthopnea, PND or increased  leg swelling  OV 03/10/2015  Chief Complaint  Patient presents with  . Follow-up    Still having SOB and coughing up mostly clear thick mucus with occ green mucus.     Followup chronic respiratory failure on account of obesity, sleep apnea, COPD and diastolic heart failure   Is improved from his recent exacerbation with H. influenzae.. Currently he is here with his wife. Both at test that he is doing well. He uses nocturnal ventilation. Daytime tracheostomy with Passy-Muir valve. He is very adamant that he wants to get his tracheostomy removed no matter what the consequences are. Currently feeling fine. Baseline chronic venous stasis edema present. There are no acute complaints. >>discussed with ENT to set up for trial of trach capping   .04/14/2015 Follow up : COPD /trach dependent , noct vent , OSA/OHS, Diastolic CHF  Pt returns for 1 month follow up  Complains of increased cough and congestion Pt c/o SOB and wheezing at times, chest congestion with a prod cough with light green/yellow mucus.  Denies any chest tightness, fever, or nausea and vomitting. Pt has not made appt with ENT for trach capping.  Pt was suppose to see ENT to be set up for a trial of Trach capping for 4 weeks . Then to decide if able to decanulate.  Would like [place] to call over and get this set up .  Remains on Brovana and Pulmicort .  On Oxygen 4l/m in daytime with trach collar and Vent at At bedtime  .  No chest pain, orthopnea, edema or fever   Recently moved. Starting to get settled in.    Review of Systems  Constitutional: Negative for fever and unexpected weight change.  HENT: Negative for congestion, dental problem, ear pain, nosebleeds, postnasal drip, rhinorrhea, sinus pressure, sneezing, sore throat and trouble  swallowing.   Eyes: Negative for redness and itching.  Respiratory: Negative for cough, chest tightness, shortness of breath and wheezing.   Cardiovascular: Negative for palpitations and leg swelling.  Gastrointestinal: Negative for nausea and vomiting.  Genitourinary: Negative for dysuria.  Musculoskeletal: Negative for joint swelling.  Skin: Negative for rash.  Neurological: Negative for headaches.  Hematological: Does not bruise/bleed easily.  Psychiatric/Behavioral: Negative for dysphoric mood. The patient is not nervous/anxious.        Objective:   Physical Exam  GEN: A/Ox3; pleasant , NAD, chronically ill appearing on oxygen  Vitals signs reviewed   HEENT:  [place]/AT,  EACs-clear, TMs-wnl, NOSE-clear, THROAT-clear, no lesions, no postnasal drip or exudate noted.   NECK:  Supple w/ fair ROM; no JVD; normal carotid impulses w/o bruits; no thyromegaly or nodules palpated; no lymphadenopathy.Trach midline , dsg clean and dry .   RESP  Clear  P & A; w/o, wheezes/ rales/ or rhonchi.no accessory muscle use, no dullness to percussion  CARD:  RRR, no m/r/g  , no peripheral edema, pulses intact, no cyanosis or clubbing.  GI:   Soft & nt; nml bowel sounds; no organomegaly or masses detected.  Musco: Warm bil, no deformities or joint swelling noted.   Neuro: alert, no focal deficits noted.    Skin: Warm, no lesions or rashes

## 2015-04-15 ENCOUNTER — Encounter (HOSPITAL_COMMUNITY)
Admission: RE | Admit: 2015-04-15 | Discharge: 2015-04-15 | Disposition: A | Discharge status: Home / Self Care | Payer: Medicare Other | Source: Ambulatory Visit | Attending: Nephrology | Admitting: Nephrology

## 2015-04-15 DIAGNOSIS — D631 Anemia in chronic kidney disease: Secondary | ICD-10-CM | POA: Insufficient documentation

## 2015-04-15 DIAGNOSIS — N183 Chronic kidney disease, stage 3 (moderate): Secondary | ICD-10-CM | POA: Insufficient documentation

## 2015-04-15 MED ORDER — SODIUM CHLORIDE 0.9 % IV SOLN
510.0000 mg | INTRAVENOUS | Status: DC
  Administered 2015-04-15: 510 mg via INTRAVENOUS
  Filled 2015-04-15: qty 17

## 2015-04-23 ENCOUNTER — Inpatient Hospital Stay (HOSPITAL_COMMUNITY): Admission: RE | Admit: 2015-04-23 | Payer: Medicare Other | Source: Ambulatory Visit

## 2015-04-26 DIAGNOSIS — G4733 Obstructive sleep apnea (adult) (pediatric): Secondary | ICD-10-CM | POA: Diagnosis not present

## 2015-04-26 DIAGNOSIS — E662 Morbid (severe) obesity with alveolar hypoventilation: Secondary | ICD-10-CM | POA: Diagnosis not present

## 2015-04-26 DIAGNOSIS — J449 Chronic obstructive pulmonary disease, unspecified: Secondary | ICD-10-CM | POA: Diagnosis not present

## 2015-04-26 DIAGNOSIS — Z930 Tracheostomy status: Secondary | ICD-10-CM | POA: Diagnosis not present

## 2015-05-07 ENCOUNTER — Encounter (HOSPITAL_COMMUNITY)
Admission: RE | Admit: 2015-05-07 | Discharge: 2015-05-07 | Disposition: A | Discharge status: Home / Self Care | Payer: Medicare Other | Source: Ambulatory Visit | Attending: Nephrology | Admitting: Nephrology

## 2015-05-07 DIAGNOSIS — D631 Anemia in chronic kidney disease: Secondary | ICD-10-CM | POA: Diagnosis not present

## 2015-05-07 DIAGNOSIS — R0602 Shortness of breath: Secondary | ICD-10-CM | POA: Diagnosis not present

## 2015-05-07 DIAGNOSIS — N183 Chronic kidney disease, stage 3 (moderate): Secondary | ICD-10-CM | POA: Diagnosis not present

## 2015-05-07 LAB — RENAL FUNCTION PANEL
ALBUMIN: 3.4 g/dL — AB (ref 3.5–5.0)
ANION GAP: 10 (ref 5–15)
BUN: 74 mg/dL — ABNORMAL HIGH (ref 6–20)
CALCIUM: 9.5 mg/dL (ref 8.9–10.3)
CO2: 42 mmol/L — ABNORMAL HIGH (ref 22–32)
Chloride: 91 mmol/L — ABNORMAL LOW (ref 101–111)
Creatinine, Ser: 2.28 mg/dL — ABNORMAL HIGH (ref 0.61–1.24)
GFR calc non Af Amer: 29 mL/min — ABNORMAL LOW (ref >60)
GFR, EST AFRICAN AMERICAN: 33 mL/min — AB (ref >60)
GLUCOSE: 123 mg/dL — AB (ref 65–99)
PHOSPHORUS: 3.6 mg/dL (ref 2.5–4.6)
POTASSIUM: 3.9 mmol/L (ref 3.5–5.1)
Sodium: 143 mmol/L (ref 135–145)

## 2015-05-07 LAB — POCT HEMOGLOBIN-HEMACUE: HEMOGLOBIN: 9.4 g/dL — AB (ref 13.0–17.0)

## 2015-05-07 LAB — IRON AND TIBC
Iron: 40 ug/dL — ABNORMAL LOW (ref 45–182)
SATURATION RATIOS: 17 % — AB (ref 17.9–39.5)
TIBC: 242 ug/dL — ABNORMAL LOW (ref 250–450)
UIBC: 202 ug/dL

## 2015-05-07 LAB — FERRITIN: Ferritin: 549 ng/mL — ABNORMAL HIGH (ref 24–336)

## 2015-05-07 MED ORDER — DARBEPOETIN ALFA 200 MCG/0.4ML IJ SOSY
200.0000 ug | PREFILLED_SYRINGE | INTRAMUSCULAR | Status: DC
  Administered 2015-05-07: 200 ug via SUBCUTANEOUS

## 2015-05-07 MED ORDER — DARBEPOETIN ALFA 200 MCG/0.4ML IJ SOSY
PREFILLED_SYRINGE | INTRAMUSCULAR | Status: AC
  Filled 2015-05-07: qty 0.4

## 2015-05-14 DIAGNOSIS — J9610 Chronic respiratory failure, unspecified whether with hypoxia or hypercapnia: Secondary | ICD-10-CM | POA: Diagnosis not present

## 2015-05-17 DIAGNOSIS — E1165 Type 2 diabetes mellitus with hyperglycemia: Secondary | ICD-10-CM | POA: Diagnosis not present

## 2015-05-17 DIAGNOSIS — I10 Essential (primary) hypertension: Secondary | ICD-10-CM | POA: Diagnosis not present

## 2015-05-17 DIAGNOSIS — I998 Other disorder of circulatory system: Secondary | ICD-10-CM | POA: Diagnosis not present

## 2015-05-17 DIAGNOSIS — Z23 Encounter for immunization: Secondary | ICD-10-CM | POA: Diagnosis not present

## 2015-05-17 DIAGNOSIS — J440 Chronic obstructive pulmonary disease with acute lower respiratory infection: Secondary | ICD-10-CM | POA: Diagnosis not present

## 2015-05-18 DIAGNOSIS — J9612 Chronic respiratory failure with hypercapnia: Secondary | ICD-10-CM | POA: Diagnosis not present

## 2015-05-18 DIAGNOSIS — Z930 Tracheostomy status: Secondary | ICD-10-CM | POA: Diagnosis not present

## 2015-05-18 DIAGNOSIS — Z430 Encounter for attention to tracheostomy: Secondary | ICD-10-CM | POA: Diagnosis not present

## 2015-05-18 DIAGNOSIS — G4733 Obstructive sleep apnea (adult) (pediatric): Secondary | ICD-10-CM | POA: Diagnosis not present

## 2015-05-19 ENCOUNTER — Telehealth: Payer: Self-pay | Admitting: Cardiovascular Disease

## 2015-05-19 MED ORDER — FUROSEMIDE 40 MG PO TABS: ORAL_TABLET | ORAL | Status: DC

## 2015-05-19 NOTE — Telephone Encounter (Signed)
Per patient report, he has been taking 2 tablets in AM, 1 in PM of furosemide. Called [facility], they needed dose resubmitted to process for insurance to cover.  Escript sent to pharmacy.  Called back patient, LM & notified to call if further needs.

## 2015-05-19 NOTE — Telephone Encounter (Signed)
[name] [name] is calling because the pharmacy is asking for a new prescription with the new instructions on it . Please call [place] .Marland Kitchen Thanks

## 2015-05-20 ENCOUNTER — Encounter (HOSPITAL_COMMUNITY)
Admission: RE | Admit: 2015-05-20 | Discharge: 2015-05-20 | Disposition: A | Discharge status: Home / Self Care | Payer: Medicare Other | Source: Ambulatory Visit | Attending: Nephrology | Admitting: Nephrology

## 2015-05-20 DIAGNOSIS — N183 Chronic kidney disease, stage 3 (moderate): Secondary | ICD-10-CM | POA: Diagnosis not present

## 2015-05-20 DIAGNOSIS — D631 Anemia in chronic kidney disease: Secondary | ICD-10-CM | POA: Insufficient documentation

## 2015-05-20 LAB — POCT HEMOGLOBIN-HEMACUE: Hemoglobin: 9 g/dL — ABNORMAL LOW (ref 13.0–17.0)

## 2015-05-20 MED ORDER — DARBEPOETIN ALFA 200 MCG/0.4ML IJ SOSY
PREFILLED_SYRINGE | INTRAMUSCULAR | Status: AC
  Filled 2015-05-20: qty 0.4

## 2015-05-20 MED ORDER — DARBEPOETIN ALFA 200 MCG/0.4ML IJ SOSY
200.0000 ug | PREFILLED_SYRINGE | INTRAMUSCULAR | Status: DC
  Administered 2015-05-20: 200 ug via SUBCUTANEOUS

## 2015-05-26 ENCOUNTER — Encounter: Payer: Self-pay | Admitting: Internal Medicine

## 2015-05-26 ENCOUNTER — Ambulatory Visit (INDEPENDENT_AMBULATORY_CARE_PROVIDER_SITE_OTHER): Payer: Medicare Other | Admitting: Internal Medicine

## 2015-05-26 VITALS — BP 110/72 | HR 114 | Ht 73.0 in | Wt 276.0 lb

## 2015-05-26 DIAGNOSIS — Z930 Tracheostomy status: Secondary | ICD-10-CM

## 2015-05-26 DIAGNOSIS — J449 Chronic obstructive pulmonary disease, unspecified: Secondary | ICD-10-CM | POA: Diagnosis not present

## 2015-05-26 DIAGNOSIS — G4733 Obstructive sleep apnea (adult) (pediatric): Secondary | ICD-10-CM | POA: Diagnosis not present

## 2015-05-26 DIAGNOSIS — E662 Morbid (severe) obesity with alveolar hypoventilation: Secondary | ICD-10-CM | POA: Diagnosis not present

## 2015-05-26 NOTE — Patient Instructions (Signed)
ICD-9-CM ICD-10-CM   1. Tracheostomy status ([place]) V44.0 Z93.0   2. Chronic obstructive pulmonary disease, unspecified COPD, unspecified chronic bronchitis type 496 J44.9     -- Call us when you ar ready for decannulationm - we will do elective admission and have DR Redmond Baseman team come by and decannulae you

## 2015-05-26 NOTE — Progress Notes (Signed)
Subjective:    Patient ID: [name] [name], male    DOB: 11/12/1950, 64 y.o.   MRN: [id number]  HPI     OV 05/26/2015  Chief Complaint  Patient presents with  . Follow-up    Pt states his breathing is unchanged since last OV. Pt c/o prod cough with clear mucus, pt states he has a hard time bringing the mucus up. Pt c/o upper left chest pain when on vent at night.    COPD, chronic diastolic failure, chronic respiratory failure with chronic tracheostomyusing nocturnal ventilation and daytime trach collar  Presents after seeing the nurse practitioner 04/14/2015. Overall stable but overall quality of life is miserable. Again he states that since having his tracheostomy his quality-of-life is extremely miserable. Specifically states that when he used to see Dr. Annamaria Boots before the tracheostomy he was doing fairly fine but after starting to see me post tracheostomy he is not doing well. I reminded him that I was not up but this spent in his tracheostomy decision. He again once his tracheostomy out. This was the discussion we had few months ago. He was supposed to follow up with ENT and have his tracheostomy capped but it is unclear to me why this did not happen. I did speak to Dr. Iona Beard ENT physician today. Both Dr. Redmond Baseman and patient's share concern that post tracheostomy removal he could decompensate.nevertheless impression from the patient is that life with tracheostomy is significantly worse and he is willing to take the risk of having the tracheostomy decannulated and monitor. He is frustrated that with much back and forth between our service and ENT history tracheostomy still has not been removed although there is some follow-up issues with him as well.  The main issue is that he feels short of breath, cough, feeling of mucus stuck in his throat, not able to have a good voice. Tragus are interfering with quality of life  Of note he has intentionally lost 15 pounds since last visit with me and  this makes him feel better   Review of Systems  As per history of present illness     Objective:   Physical Exam  Constitutional: He is oriented to person, place, and time. He appears well-developed and well-nourished. No distress.  Looks better looks leaner  HENT:  Head: Normocephalic and atraumatic.  Right Ear: External ear normal.  Left Ear: External ear normal.  Mouth/Throat: Oropharynx is clear and moist. No oropharyngeal exudate.  Chronic tracheostomy  Eyes: Conjunctivae and EOM are normal. Pupils are equal, round, and reactive to light. Right eye exhibits no discharge. Left eye exhibits no discharge. No scleral icterus.  Neck: Normal range of motion. Neck supple. No JVD present. No tracheal deviation present. No thyromegaly present.  Cardiovascular: Normal rate, regular rhythm and intact distal pulses.  Exam reveals no gallop and no friction rub.   No murmur heard. Pulmonary/Chest: Effort normal and breath sounds normal. No respiratory distress. He has no wheezes. He has no rales. He exhibits no tenderness.  Abdominal: Soft. Bowel sounds are normal. He exhibits no distension and no mass. There is no tenderness. There is no rebound and no guarding.  Musculoskeletal: Normal range of motion. He exhibits edema. He exhibits no tenderness.  Chronic 1+ edema  Lymphadenopathy:    He has no cervical adenopathy.  Neurological: He is alert and oriented to person, place, and time. He has normal reflexes. No cranial nerve deficit. Coordination normal.  Skin: Skin is warm and dry. No rash  noted. He is not diaphoretic. No erythema. No pallor.  Psychiatric: He has a normal mood and affect. His behavior is normal. Judgment and thought content normal.  Nursing note and vitals reviewed.   Filed Vitals:   05/26/15 1334  BP: 110/72  Pulse: 114  Height: 6\' 1"  (1.854 m)  Weight: 276 lb (125.193 kg)  SpO2: 72%        Assessment & Plan:     ICD-9-CM ICD-10-CM   1. Tracheostomy status  ([place]) V44.0 Z93.0   2. Chronic obstructive pulmonary disease, unspecified COPD, unspecified chronic bronchitis type 496 J44.9    - I think the best approach is to electively admit him. His to His tracheostomy in the hospital for a few days and then have Dr. Redmond Baseman, Dr. decannulate the tracheostomy. Post tracheostomy monitor in the hospital for few to several days. I discussed this plan with Dr. Redmond Baseman ENT and he is in support of this plan.. I discussed this plan with the patient and the wife and he seems frustrated that he has to get admitted to the hospital and may be monitored for few to several days but he understands that this might be the best path for his tracheostomy decannulation. He will call us sometime in the next several days to give [place] a date for his elective admission. He understands the risk of post-decannulation respiratory failure, stridor etc.   (> 50% of this 15 min visit spent in face to face counseling or/and coordination of care)   Dr. Brand Males, M.D., [facility]C.P Pulmonary and Critical Care Medicine Staff Physician [place] [facility] Pager: [phone], If no answer or between  15:00h - 7:00h: call [phone]  05/26/2015 3:41 PM

## 2015-05-31 ENCOUNTER — Telehealth: Payer: Self-pay | Admitting: Internal Medicine

## 2015-05-31 NOTE — Telephone Encounter (Signed)
Per 05/26/15 OV: -- Call us when you ar ready for decannulationm - we will do elective admission and have DR Redmond Baseman team come by and decannulae you --  Spoke with spouse. She reports pt is now ready to have trach removed. MR please advise thanks

## 2015-05-31 NOTE — Telephone Encounter (Signed)
Admit to SDU at [facility] - indciation - tracheostomy complication, copd, chronic resp failure, decannulation  Elective admit  Upon arrival - RN to page [phone] - prefer early AM admit 06/01/15  Let him know some of this depends on bed availability  Put under my service  Let me know when you have him in pre-admit list

## 2015-05-31 NOTE — Telephone Encounter (Signed)
Pt spouse is aware of below. She will go over to [facility] in AM to admitting.  FYI for MR

## 2015-05-31 NOTE — Telephone Encounter (Signed)
Called over to Bed control. Pt is on pre admit list now. They will arrive pt once they open in the AM. Pt needs to go to admitting over at [facility] in the AM. Called pt and [facility] x1 to make are. Also will forward to MR as an [place]

## 2015-06-01 ENCOUNTER — Inpatient Hospital Stay (HOSPITAL_COMMUNITY)
Admission: AD | Admit: 2015-06-01 | Discharge: 2015-06-04 | DRG: 208 | Disposition: A | Discharge status: Home / Self Care | Payer: Medicare Other | Source: Ambulatory Visit | Attending: Internal Medicine | Admitting: Internal Medicine

## 2015-06-01 DIAGNOSIS — Z87891 Personal history of nicotine dependence: Secondary | ICD-10-CM | POA: Diagnosis not present

## 2015-06-01 DIAGNOSIS — J9690 Respiratory failure, unspecified, unspecified whether with hypoxia or hypercapnia: Secondary | ICD-10-CM | POA: Diagnosis not present

## 2015-06-01 DIAGNOSIS — I2510 Atherosclerotic heart disease of native coronary artery without angina pectoris: Secondary | ICD-10-CM | POA: Diagnosis not present

## 2015-06-01 DIAGNOSIS — N183 Chronic kidney disease, stage 3 unspecified: Secondary | ICD-10-CM | POA: Diagnosis present

## 2015-06-01 DIAGNOSIS — Z930 Tracheostomy status: Secondary | ICD-10-CM

## 2015-06-01 DIAGNOSIS — I130 Hypertensive heart and chronic kidney disease with heart failure and stage 1 through stage 4 chronic kidney disease, or unspecified chronic kidney disease: Secondary | ICD-10-CM | POA: Diagnosis not present

## 2015-06-01 DIAGNOSIS — E1122 Type 2 diabetes mellitus with diabetic chronic kidney disease: Secondary | ICD-10-CM | POA: Diagnosis present

## 2015-06-01 DIAGNOSIS — G9340 Encephalopathy, unspecified: Secondary | ICD-10-CM | POA: Diagnosis present

## 2015-06-01 DIAGNOSIS — Z6835 Body mass index (BMI) 35.0-35.9, adult: Secondary | ICD-10-CM | POA: Diagnosis not present

## 2015-06-01 DIAGNOSIS — Z8673 Personal history of transient ischemic attack (TIA), and cerebral infarction without residual deficits: Secondary | ICD-10-CM

## 2015-06-01 DIAGNOSIS — Z951 Presence of aortocoronary bypass graft: Secondary | ICD-10-CM | POA: Diagnosis not present

## 2015-06-01 DIAGNOSIS — I5032 Chronic diastolic (congestive) heart failure: Secondary | ICD-10-CM

## 2015-06-01 DIAGNOSIS — K219 Gastro-esophageal reflux disease without esophagitis: Secondary | ICD-10-CM | POA: Diagnosis not present

## 2015-06-01 DIAGNOSIS — Z9119 Patient's noncompliance with other medical treatment and regimen: Secondary | ICD-10-CM

## 2015-06-01 DIAGNOSIS — M109 Gout, unspecified: Secondary | ICD-10-CM | POA: Diagnosis present

## 2015-06-01 DIAGNOSIS — I119 Hypertensive heart disease without heart failure: Secondary | ICD-10-CM | POA: Diagnosis present

## 2015-06-01 DIAGNOSIS — Z952 Presence of prosthetic heart valve: Secondary | ICD-10-CM

## 2015-06-01 DIAGNOSIS — J449 Chronic obstructive pulmonary disease, unspecified: Secondary | ICD-10-CM | POA: Diagnosis present

## 2015-06-01 DIAGNOSIS — Z7982 Long term (current) use of aspirin: Secondary | ICD-10-CM

## 2015-06-01 DIAGNOSIS — Z7902 Long term (current) use of antithrombotics/antiplatelets: Secondary | ICD-10-CM | POA: Diagnosis not present

## 2015-06-01 DIAGNOSIS — I110 Hypertensive heart disease with heart failure: Secondary | ICD-10-CM | POA: Diagnosis not present

## 2015-06-01 DIAGNOSIS — N189 Chronic kidney disease, unspecified: Secondary | ICD-10-CM

## 2015-06-01 DIAGNOSIS — E662 Morbid (severe) obesity with alveolar hypoventilation: Secondary | ICD-10-CM | POA: Diagnosis not present

## 2015-06-01 DIAGNOSIS — J438 Other emphysema: Secondary | ICD-10-CM | POA: Diagnosis not present

## 2015-06-01 DIAGNOSIS — J9612 Chronic respiratory failure with hypercapnia: Secondary | ICD-10-CM | POA: Diagnosis not present

## 2015-06-01 DIAGNOSIS — J9610 Chronic respiratory failure, unspecified whether with hypoxia or hypercapnia: Secondary | ICD-10-CM | POA: Diagnosis not present

## 2015-06-01 DIAGNOSIS — E1151 Type 2 diabetes mellitus with diabetic peripheral angiopathy without gangrene: Secondary | ICD-10-CM | POA: Diagnosis not present

## 2015-06-01 DIAGNOSIS — F419 Anxiety disorder, unspecified: Secondary | ICD-10-CM | POA: Diagnosis not present

## 2015-06-01 DIAGNOSIS — I739 Peripheral vascular disease, unspecified: Secondary | ICD-10-CM | POA: Diagnosis present

## 2015-06-01 DIAGNOSIS — J9620 Acute and chronic respiratory failure, unspecified whether with hypoxia or hypercapnia: Secondary | ICD-10-CM | POA: Diagnosis not present

## 2015-06-01 DIAGNOSIS — Z9911 Dependence on respirator [ventilator] status: Secondary | ICD-10-CM | POA: Diagnosis not present

## 2015-06-01 DIAGNOSIS — J9622 Acute and chronic respiratory failure with hypercapnia: Secondary | ICD-10-CM | POA: Diagnosis not present

## 2015-06-01 DIAGNOSIS — G4700 Insomnia, unspecified: Secondary | ICD-10-CM | POA: Diagnosis present

## 2015-06-01 DIAGNOSIS — Z6841 Body Mass Index (BMI) 40.0 and over, adult: Secondary | ICD-10-CM

## 2015-06-01 DIAGNOSIS — G4733 Obstructive sleep apnea (adult) (pediatric): Secondary | ICD-10-CM

## 2015-06-01 DIAGNOSIS — N179 Acute kidney failure, unspecified: Secondary | ICD-10-CM

## 2015-06-01 DIAGNOSIS — E872 Acidosis: Secondary | ICD-10-CM | POA: Diagnosis present

## 2015-06-01 DIAGNOSIS — Z79899 Other long term (current) drug therapy: Secondary | ICD-10-CM | POA: Diagnosis not present

## 2015-06-01 DIAGNOSIS — J9611 Chronic respiratory failure with hypoxia: Secondary | ICD-10-CM | POA: Diagnosis not present

## 2015-06-01 DIAGNOSIS — J189 Pneumonia, unspecified organism: Secondary | ICD-10-CM

## 2015-06-01 DIAGNOSIS — E1159 Type 2 diabetes mellitus with other circulatory complications: Secondary | ICD-10-CM | POA: Diagnosis present

## 2015-06-01 LAB — BLOOD GAS, ARTERIAL
Acid-Base Excess: 16.5 mmol/L — ABNORMAL HIGH (ref 0.0–2.0)
Acid-Base Excess: 15.8 mmol/L — ABNORMAL HIGH (ref 0.0–2.0)
Acid-Base Excess: 15.1 mmol/L — ABNORMAL HIGH (ref 0.0–2.0)
BICARBONATE: 43.3 meq/L — AB (ref 20.0–24.0)
Bicarbonate: 43.1 mEq/L — ABNORMAL HIGH (ref 20.0–24.0)
Bicarbonate: 42.8 mEq/L — ABNORMAL HIGH (ref 20.0–24.0)
DRAWN BY: 129711
Drawn by: 274071
Drawn by: 252031
FIO2: 0.35
O2 CONTENT: 4 L/min
O2 Content: 4 L/min
O2 SAT: 90.8 %
O2 SAT: 95.5 %
O2 Saturation: 99.3 %
PATIENT TEMPERATURE: 98.6
PATIENT TEMPERATURE: 98.6
PCO2 ART: 83.7 mmHg — AB (ref 35.0–45.0)
PCO2 ART: 103 mmHg — AB (ref 35.0–45.0)
PH ART: 7.332 — AB (ref 7.350–7.450)
PO2 ART: 62.9 mmHg — AB (ref 80.0–100.0)
PO2 ART: 184 mmHg — AB (ref 80.0–100.0)
Patient temperature: 98.6
TCO2: 45.7 mmol/L (ref 0–100)
TCO2: 46.3 mmol/L (ref 0–100)
TCO2: 45.9 mmol/L (ref 0–100)
pCO2 arterial: 99.6 mmHg (ref 35.0–45.0)
pH, Arterial: 7.261 — ABNORMAL LOW (ref 7.350–7.450)
pH, Arterial: 7.243 — ABNORMAL LOW (ref 7.350–7.450)
pO2, Arterial: 89.7 mmHg (ref 80.0–100.0)

## 2015-06-01 LAB — LACTIC ACID, PLASMA: LACTIC ACID, VENOUS: 1.1 mmol/L (ref 0.5–2.0)

## 2015-06-01 LAB — PHOSPHORUS: PHOSPHORUS: 3.9 mg/dL (ref 2.5–4.6)

## 2015-06-01 LAB — COMPREHENSIVE METABOLIC PANEL
ALT: 9 U/L — ABNORMAL LOW (ref 17–63)
ANION GAP: 11 (ref 5–15)
AST: 13 U/L — ABNORMAL LOW (ref 15–41)
Albumin: 3.3 g/dL — ABNORMAL LOW (ref 3.5–5.0)
Alkaline Phosphatase: 73 U/L (ref 38–126)
BUN: 67 mg/dL — ABNORMAL HIGH (ref 6–20)
CHLORIDE: 88 mmol/L — AB (ref 101–111)
CO2: 41 mmol/L — ABNORMAL HIGH (ref 22–32)
Calcium: 9.1 mg/dL (ref 8.9–10.3)
Creatinine, Ser: 2.18 mg/dL — ABNORMAL HIGH (ref 0.61–1.24)
GFR, EST AFRICAN AMERICAN: 35 mL/min — AB (ref >60)
GFR, EST NON AFRICAN AMERICAN: 30 mL/min — AB (ref >60)
Glucose, Bld: 111 mg/dL — ABNORMAL HIGH (ref 65–99)
POTASSIUM: 3.9 mmol/L (ref 3.5–5.1)
Sodium: 140 mmol/L (ref 135–145)
Total Bilirubin: 0.4 mg/dL (ref 0.3–1.2)
Total Protein: 7 g/dL (ref 6.5–8.1)

## 2015-06-01 LAB — CBC
HCT: 32.5 % — ABNORMAL LOW (ref 39.0–52.0)
Hemoglobin: 9 g/dL — ABNORMAL LOW (ref 13.0–17.0)
MCH: 26.2 pg (ref 26.0–34.0)
MCHC: 27.7 g/dL — AB (ref 30.0–36.0)
MCV: 94.5 fL (ref 78.0–100.0)
PLATELETS: 170 10*3/uL (ref 150–400)
RBC: 3.44 MIL/uL — ABNORMAL LOW (ref 4.22–5.81)
RDW: 15.4 % (ref 11.5–15.5)
WBC: 5.6 10*3/uL (ref 4.0–10.5)

## 2015-06-01 LAB — PROTIME-INR
INR: 1.06 (ref 0.00–1.49)
PROTHROMBIN TIME: 14 s (ref 11.6–15.2)

## 2015-06-01 LAB — GLUCOSE, CAPILLARY
GLUCOSE-CAPILLARY: 111 mg/dL — AB (ref 65–99)
Glucose-Capillary: 137 mg/dL — ABNORMAL HIGH (ref 65–99)
Glucose-Capillary: 123 mg/dL — ABNORMAL HIGH (ref 65–99)
Glucose-Capillary: 170 mg/dL — ABNORMAL HIGH (ref 65–99)

## 2015-06-01 LAB — APTT: aPTT: 31 seconds (ref 24–37)

## 2015-06-01 LAB — CORTISOL: CORTISOL PLASMA: 10.7 ug/dL

## 2015-06-01 LAB — PROCALCITONIN: PROCALCITONIN: 0.14 ng/mL

## 2015-06-01 LAB — MAGNESIUM: MAGNESIUM: 1.9 mg/dL (ref 1.7–2.4)

## 2015-06-01 LAB — MRSA PCR SCREENING: MRSA BY PCR: NEGATIVE

## 2015-06-01 MED ORDER — BUDESONIDE 0.5 MG/2ML IN SUSP
0.5000 mg | Freq: Two times a day (BID) | RESPIRATORY_TRACT | Status: DC
  Administered 2015-06-01 – 2015-06-04 (×6): 0.5 mg via RESPIRATORY_TRACT
  Filled 2015-06-01 (×8): qty 2

## 2015-06-01 MED ORDER — CHLORHEXIDINE GLUCONATE 0.12 % MT SOLN
15.0000 mL | Freq: Two times a day (BID) | OROMUCOSAL | Status: DC
  Administered 2015-06-01 – 2015-06-04 (×6): 15 mL via OROMUCOSAL
  Filled 2015-06-01 (×3): qty 15

## 2015-06-01 MED ORDER — ARFORMOTEROL TARTRATE 15 MCG/2ML IN NEBU
15.0000 ug | INHALATION_SOLUTION | Freq: Two times a day (BID) | RESPIRATORY_TRACT | Status: DC
  Administered 2015-06-01 – 2015-06-04 (×6): 15 ug via RESPIRATORY_TRACT
  Filled 2015-06-01 (×5): qty 2

## 2015-06-01 MED ORDER — CETYLPYRIDINIUM CHLORIDE 0.05 % MT LIQD: 7.0000 mL | Freq: Two times a day (BID) | OROMUCOSAL | Status: DC

## 2015-06-01 MED ORDER — HEPARIN SODIUM (PORCINE) 5000 UNIT/ML IJ SOLN
5000.0000 [IU] | Freq: Three times a day (TID) | INTRAMUSCULAR | Status: DC
  Administered 2015-06-01 – 2015-06-04 (×9): 5000 [IU] via SUBCUTANEOUS
  Filled 2015-06-01 (×8): qty 1

## 2015-06-01 MED ORDER — SODIUM CHLORIDE 0.9 % IV SOLN: 250.0000 mL | INTRAVENOUS | Status: DC | PRN

## 2015-06-01 MED ORDER — ASPIRIN EC 81 MG PO TBEC
81.0000 mg | DELAYED_RELEASE_TABLET | Freq: Every day | ORAL | Status: DC
  Administered 2015-06-01 – 2015-06-04 (×3): 81 mg via ORAL
  Filled 2015-06-01 (×4): qty 1

## 2015-06-01 MED ORDER — SODIUM CHLORIDE 0.9 % IJ SOLN
3.0000 mL | Freq: Two times a day (BID) | INTRAMUSCULAR | Status: DC
  Administered 2015-06-01 – 2015-06-04 (×5): 3 mL via INTRAVENOUS

## 2015-06-01 MED ORDER — ACETAMINOPHEN 500 MG PO TABS: 500.0000 mg | ORAL_TABLET | Freq: Four times a day (QID) | ORAL | Status: DC | PRN

## 2015-06-01 MED ORDER — ALLOPURINOL 100 MG PO TABS
100.0000 mg | ORAL_TABLET | Freq: Every day | ORAL | Status: DC
Start: 2015-06-01 — End: 2015-06-04
  Administered 2015-06-01 – 2015-06-04 (×3): 100 mg via ORAL
  Filled 2015-06-01 (×4): qty 1

## 2015-06-01 MED ORDER — ALBUTEROL SULFATE (2.5 MG/3ML) 0.083% IN NEBU: 2.5000 mg | INHALATION_SOLUTION | RESPIRATORY_TRACT | Status: DC | PRN

## 2015-06-01 MED ORDER — SODIUM CHLORIDE 0.9 % IJ SOLN: 3.0000 mL | INTRAMUSCULAR | Status: DC | PRN

## 2015-06-01 NOTE — Progress Notes (Signed)
Pt on 4l nasal cannula oxygen humidity. Pt tolerated well once set up. Pt is stable at this time. RT will continue to monitor.

## 2015-06-01 NOTE — H&P (Signed)
PULMONARY / CRITICAL CARE MEDICINE   Name: [name] [name] MRN: [id number] DOB: 1950/10/26    ADMISSION DATE:  06/01/2015   REFERRING MD :  Chase Caller  CHIEF COMPLAINT:  Wants tracheostomy  INITIAL PRESENTATION:  Tracheostomy evaluation  STUDIES:    SIGNIFICANT EVENTS:    HISTORY OF PRESENT ILLNESS:   64 yo MO AAM with OSA/OHS and has been tracheostomy dependent sine 05/24/2015. He has a plethora of health issues some of which are, , OSA rch and vet dependent, CAD post CABG, CHF lasix and zaroxolyn dependent, DM, angioedema and non compliance. He has been evaluated by Dr.s Chase Caller and Redmond Baseman for possible decannulation due to quality of life issues. Of note he was non compliant with bipap prior to tracheostomy. We will admit and admit to cap his tracheostomy at night use bipap to ascertain his ability to survive with out a trach. Of note he cant tolerate PMV due to SOB. He will need more weight loss ion future with better control of CHF.  PAST MEDICAL HISTORY :   has a past medical history of Coronary atherosclerosis of unspecified type of vessel, native or graft; Chronic pulmonary heart disease, unspecified; Chronic respiratory failure ([place]); Chronic airway obstruction, not elsewhere classified; Angina; Anemia; Anxiety; Hypertension; CHF (congestive heart failure) ([place]); GERD (gastroesophageal reflux disease); History of gout; Chronic diastolic CHF (congestive heart failure) ([place]) (10/28/2007); Type 2 diabetes mellitus with vascular disease ([place]) (07/13/2011); PVD (peripheral vascular disease) ([place]) (07/13/2011); Swelling of limb; S/P CABG (coronary artery bypass graft) (x 6 2000 or 2001); TIA (transient ischemic attack); PVD (peripheral vascular disease) ([place]); COPD (chronic obstructive pulmonary disease) ([place]); Hyperlipidemia; Shortness of breath dyspnea; Chronic kidney disease; Chronic renal insufficiency, stage III (moderate); Unspecified sleep apnea; Obstructive sleep apnea  (10/28/2007); OSA (obstructive sleep apnea); and Arthritis.  has past surgical history that includes Coronary artery bypass graft (07/12/2000); Appendectomy; Bilateral VATS ablation; hemmorhoids; lung sx (2005); Esophagogastroduodenoscopy (08/11/2011); Colonoscopy (08/11/2011); Cataract extraction; Mitral valve replacement (04/16/2008); Cardiac catheterization (02/16/2010); Cardiac catheterization (03/29/2009); Cardiac catheterization (08/05/2004); Cardiac catheterization (06/28/2000); Abdominal Aortogram (12/03/2002); Tracheostomy tube placement (N/A, 05/23/2013); Coronary angioplasty with stent; Colonoscopy with propofol (N/A, 09/07/2014); and Esophagogastroduodenoscopy (egd) with propofol (N/A, 09/07/2014). Prior to Admission medications   Medication Sig Start Date End Date Taking? Authorizing Provider  acetaminophen (TYLENOL) 500 MG tablet Take 500 mg by mouth every 6 (six) hours as needed for mild pain or moderate pain.    Historical Provider, MD  albuterol (PROVENTIL) (2.5 MG/3ML) 0.083% nebulizer solution Take 3 mLs (2.5 mg total) by nebulization every 4 (four) hours as needed for wheezing. 09/29/14 09/29/15  Brand Males, MD  allopurinol (ZYLOPRIM) 100 MG tablet Take 1 tablet by mouth daily. 10/08/13   Historical Provider, MD  arformoterol (BROVANA) 15 MCG/2ML NEBU Take 2 mLs (15 mcg total) by nebulization 2 (two) times daily. 11/19/14   Nishant Dhungel, MD  aspirin 81 MG tablet Take 81 mg by mouth daily.    Historical Provider, MD  budesonide (PULMICORT) 0.5 MG/2ML nebulizer solution Take 0.5 mg by nebulization 2 (two) times daily.    Historical Provider, MD  clopidogrel (PLAVIX) 75 MG tablet Take 1 tablet (75 mg total) by mouth daily. 07/14/14   Lorretta Harp, MD  COLCRYS 0.6 MG tablet Take 0.6 mg by mouth daily as needed.  04/28/13   Historical Provider, MD  dextromethorphan-guaiFENesin (MUCINEX DM) 30-600 MG per 12 hr tablet Take 1 tablet by mouth 2 (two) times daily. 11/19/14   Nishant Dhungel, MD   furosemide (LASIX)  40 MG tablet Take 2 tablets in AM, 1 tablet in PM 05/19/15   Lorretta Harp, MD  metolazone (ZAROXOLYN) 2.5 MG tablet TAKE ONE TABLET BY MOUTH ONCE DAILY ON  MONDAY,  WEDNESDAY,  AND  FRIDAY  30  MINUTES  BEFORE  LASIX 08/26/14   Lorretta Harp, MD  nitroGLYCERIN (NITROSTAT) 0.4 MG SL tablet Place 1 tablet (0.4 mg total) under the tongue every 5 (five) minutes as needed. For chest pain 01/28/14   Lorretta Harp, MD   Allergies  Allergen Reactions  . Amlodipine Besy-Benazepril Hcl Swelling    Lips swell  . Percocet [Oxycodone-Acetaminophen] Other (See Comments)    hallucinations    FAMILY HISTORY:  indicated that his mother is deceased. He indicated that his father is deceased.  SOCIAL HISTORY:  reports that he quit smoking about 16 years ago. His smoking use included Cigarettes. He has a 25 pack-year smoking history. He has never used smokeless tobacco. He reports that he does not drink alcohol or use illicit drugs.  REVIEW OF SYSTEMS:   10 point review of system taken, please see HPI for positives and negatives.   SUBJECTIVE:   VITAL SIGNS: Temp:  [98.2 F (36.8 C)] 98.2 F (36.8 C) (10/18 0846) Pulse Rate:  [92] 92 (10/18 0835) Resp:  [17] 17 (10/18 0835) SpO2:  [99 %] 99 % (10/18 0835) FiO2 (%):  [35 %] 35 % (10/18 0835) HEMODYNAMICS:   VENTILATOR SETTINGS: Vent Mode:  [-]  FiO2 (%):  [35 %] 35 % INTAKE / OUTPUT: No intake or output data in the 24 hours ending 06/01/15 0848  PHYSICAL EXAMINATION: General: MOAAM, sob when PMV placed Neuro:  Intact HEENT: Trach cuffed in place, number 6 Cardiovascular:  HSD RRR Lungs:  Coarse rhonchi bilat Abdomen: obses+bs Musculoskeletal: intact Skin:  2 + edema lower ext, skin changes  LABS:  CBC No results for input(s): WBC, HGB, HCT, PLT in the last 168 hours. Coag's No results for input(s): APTT, INR in the last 168 hours. BMET No results for input(s): NA, K, CL, CO2, BUN, CREATININE, GLUCOSE in  the last 168 hours. Electrolytes No results for input(s): CALCIUM, MG, PHOS in the last 168 hours. Sepsis Markers No results for input(s): LATICACIDVEN, PROCALCITON, O2SATVEN in the last 168 hours. ABG No results for input(s): PHART, PCO2ART, PO2ART in the last 168 hours. Liver Enzymes No results for input(s): AST, ALT, ALKPHOS, BILITOT, ALBUMIN in the last 168 hours. Cardiac Enzymes No results for input(s): TROPONINI, PROBNP in the last 168 hours. Glucose  Recent Labs Lab 06/01/15 0841  GLUCAP 111*    Imaging No results found.   ASSESSMENT / PLAN:  PULMONARY Trached 05/24/2015>> A: OSA with tracheostomy ddepedence dependence P:   Admit for possible decannulation Cap trach and use nocturnal bipap If he cant tolerate being capped then no decannulation.  CARDIOVASCULAR  A:  HTN CAD P:  Antihypertensives Hold Plavix for possible decannulation  RENAL Lab Results  Component Value Date   CREATININE 2.28* 05/07/2015   CREATININE 1.82* 03/24/2015   CREATININE 1.98* 02/26/2015   CREATININE 1.61* 12/03/2013   CREATININE 1.73* 11/12/2013    A:  CRI P:   Follow creatine     ENDOCRINE A:   DM  P:   SSI  NEUROLOGIC A:  No acute issue RASS 2 P:   RASS goal: 1 Hold sedation   FAMILY  - Updates: Pt and wife updated at bedside.     TODAY'S SUMMARY:  64 yo  MO AAM with OSA/OHS and has been tracheostomy dependent sine 05/24/2015. He has a plethora of health issues some of which are, , OSA rch and vet dependent, CAD post CABG, CHF lasix and zaroxolyn dependent, DM, angioedema and non compliance. He has been evaluated by Dr.s Chase Caller and Redmond Baseman for possible decannulation due to quality of life issues. Of note he was non compliant with bipap prior to tracheostomy. We will admit and admit to cap his tracheostomy at night use bipap to ascertain his ability to survive with out a trach. Of note he cant tolerate PMV due to SOB. He will need more weight loss  ion future with better control of CHF.  Richardson Landry Minor ACNP [facility] PCCM Pager [phone] till 3 pm If no answer page [phone] 06/01/2015, 8:59 AM     STAFF NOTE: I, Merrie Roof, MD FACP have personally reviewed patient's available data, including medical history, events of note, physical examination and test results as part of my evaluation. I have discussed with resident/NP and other care providers such as pharmacist, RN and RRT. In addition, I personally evaluated patient and elicited key findings of: no distress with current trach, lungs are clear, secretions are low, min edema lower ext, he presents for trach management and feels he wants trach removed, but he does poorly with PMV at all and has prior poor compliance for BIPAP nocturnal, he has been adamant top have trach removed to his primary Pulmonary, he has an extra long 6 with cuff for nocturnal support, his chance of death is present with trach removal, he is alert and o x 3, my biggest concern is poor PMV use for min then get ssob, sometimes this is related to trach size as it relates TO TRACHEA DIAM, will cap him in icu and assess tolerability, if he does not tolerate will consider downsize to 4 extra long and repeat, ultimately i have explained that he could die if we remove, i want to trial above plan to convince him that trach removal is not recommended, the noncompliance e with BIPAP is also an issue but we may be able to have him improve this, today's plan is cap and then consider downsize before we consider decannulation with informed consent of death concerns, wife present also for above discussions  Lavon Paganini. Titus Mould, MD, [place] Pgr: [place] [facility] 06/01/2015 10:35 AM

## 2015-06-01 NOTE — Progress Notes (Signed)
Pt trach capped per MD order.  Pt placed on 4L [place] and tolerating well at this time.  RT will continue to monitor.

## 2015-06-01 NOTE — Progress Notes (Signed)
RT was called per RN. Pt sound rhonchus at the door on my arrival of assessment. Pt cap off of his trach was removed in order to suction patient. Pt was suctioned and had copious amounts of very very thick white sticky secretions removed. Pt was stable throughout suctioning maintaining a O2 saturation of 97% on a 4L [place]. Pt has a moderate strength cough but his Mucociliary clearance mechanics are ineffective to clear accumulated secretions. Pt tolerated suction well, no complication noted. RN/RT at bedside.

## 2015-06-01 NOTE — Progress Notes (Signed)
Notified MD Levada Dy Resident in regards to pH and pCO2 (See critical lab sticker).Pt alert and oriented x 4, on 4Liters nasal cannula. Will continue to monitor and assess.

## 2015-06-01 NOTE — Progress Notes (Signed)
Following up on ABG results.  Minimal change from previous ABG.  - nocturnal BiPap  - repeat ABG in AM - further management and decision about decannulation per day team  Virginia Crews, MD, MPH PGY-2,  [facility] 06/01/2015 11:30 PM

## 2015-06-01 NOTE — Progress Notes (Signed)
ABG collected. Pt stable throughout.

## 2015-06-01 NOTE — Progress Notes (Signed)
CRITICAL VALUE ALERT  Critical value received:  PH: 7.261, pCO2: 99.6   Date of notification:  06/01/15  Time of notification:  1953  Critical value read back: Yes   Nurse who received alert:  Polly Cobia RN   MD notified (1st page):  Resident MD Levada Dy   Time of first page:  2020  MD notified (2nd page):  Time of second page:  Responding MD:  MD Levada Dy   Time MD responded:  2025

## 2015-06-02 ENCOUNTER — Inpatient Hospital Stay (HOSPITAL_COMMUNITY): Payer: Medicare Other

## 2015-06-02 ENCOUNTER — Encounter (HOSPITAL_COMMUNITY): Payer: Self-pay

## 2015-06-02 DIAGNOSIS — N183 Chronic kidney disease, stage 3 (moderate): Secondary | ICD-10-CM

## 2015-06-02 LAB — CBC
HEMATOCRIT: 35 % — AB (ref 39.0–52.0)
Hemoglobin: 9.8 g/dL — ABNORMAL LOW (ref 13.0–17.0)
MCH: 26.6 pg (ref 26.0–34.0)
MCHC: 28 g/dL — AB (ref 30.0–36.0)
MCV: 95.1 fL (ref 78.0–100.0)
Platelets: 190 10*3/uL (ref 150–400)
RBC: 3.68 MIL/uL — ABNORMAL LOW (ref 4.22–5.81)
RDW: 15.3 % (ref 11.5–15.5)
WBC: 4.6 10*3/uL (ref 4.0–10.5)

## 2015-06-02 LAB — GLUCOSE, CAPILLARY
GLUCOSE-CAPILLARY: 114 mg/dL — AB (ref 65–99)
GLUCOSE-CAPILLARY: 125 mg/dL — AB (ref 65–99)
GLUCOSE-CAPILLARY: 146 mg/dL — AB (ref 65–99)
Glucose-Capillary: 122 mg/dL — ABNORMAL HIGH (ref 65–99)

## 2015-06-02 LAB — BLOOD GAS, ARTERIAL
ACID-BASE EXCESS: 14.3 mmol/L — AB (ref 0.0–2.0)
Bicarbonate: 41.6 mEq/L — ABNORMAL HIGH (ref 20.0–24.0)
DELIVERY SYSTEMS: POSITIVE
DRAWN BY: 252031
Expiratory PAP: 5
FIO2: 0.4
INSPIRATORY PAP: 16
O2 Saturation: 98.9 %
Patient temperature: 98.6
TCO2: 44.4 mmol/L (ref 0–100)
pCO2 arterial: 93.3 mmHg (ref 35.0–45.0)
pH, Arterial: 7.271 — ABNORMAL LOW (ref 7.350–7.450)
pO2, Arterial: 138 mmHg — ABNORMAL HIGH (ref 80.0–100.0)

## 2015-06-02 LAB — BASIC METABOLIC PANEL
ANION GAP: 12 (ref 5–15)
BUN: 62 mg/dL — AB (ref 6–20)
CALCIUM: 9.1 mg/dL (ref 8.9–10.3)
CO2: 41 mmol/L — AB (ref 22–32)
Chloride: 86 mmol/L — ABNORMAL LOW (ref 101–111)
Creatinine, Ser: 2.17 mg/dL — ABNORMAL HIGH (ref 0.61–1.24)
GFR calc Af Amer: 35 mL/min — ABNORMAL LOW (ref >60)
GFR calc non Af Amer: 31 mL/min — ABNORMAL LOW (ref >60)
GLUCOSE: 126 mg/dL — AB (ref 65–99)
Potassium: 3.9 mmol/L (ref 3.5–5.1)
Sodium: 139 mmol/L (ref 135–145)

## 2015-06-02 MED ORDER — TEMAZEPAM 7.5 MG PO CAPS
7.5000 mg | ORAL_CAPSULE | Freq: Every day | ORAL | Status: AC
  Administered 2015-06-02: 7.5 mg via ORAL
  Filled 2015-06-02: qty 1

## 2015-06-02 NOTE — Progress Notes (Signed)
CRITICAL VALUE ALERT  Critical value received: pH 7.27, pCO2 93, pO2 138, BiCarb 41  Date of notification:  06/02/2015  Time of notification:  0502  Critical value read back:Yes.    Nurse who received alert:  Polly Cobia, RN  MD notified (1st Itai Barbian): Resident MD Levada Dy  Time of first Alacia Rehmann:  0503  Responding MD: Resident MD Levada Dy  Time MD responded:  [phone]

## 2015-06-02 NOTE — Progress Notes (Signed)
ABG collected  

## 2015-06-02 NOTE — Progress Notes (Signed)
Pt on BiPAP at this time, settings in flowsheet. RN aware.

## 2015-06-02 NOTE — H&P (Addendum)
PULMONARY / CRITICAL CARE MEDICINE   Name: [name] [name] MRN: [id number] DOB: 07/14/1951    ADMISSION DATE:  06/01/2015   REFERRING MD :  Chase Caller  CHIEF COMPLAINT:  Wants tracheostomy  INITIAL PRESENTATION:  Tracheostomy evaluation  SUBJECTIVE   64 yo MO AAM with OSA/OHS and has been tracheostomy dependent. He has a plethora of health issues some of which are, , OSA rch and vet dependent, CAD post CABG, CHF lasix and zaroxolyn dependent, DM, angioedema and non compliance and CKD - June creat 2mg %. He has been evaluated by Dr.s Chase Caller and Redmond Baseman for possible decannulation due to quality of life issues. Of note he was non compliant with bipap prior to tracheostomy. We will admit and admit to cap his tracheostomy at night use bipap to ascertain his ability to survive with out a trach. Of note he cant tolerate PMV due to SOB. He will need more weight loss ion future with better control of CHF.   SUBJECTIVE/OVERNIGHT/INTERVAL HX 06/02/2015: Complains of insomnia overnight. Has remained On his tracheostomy since yesterday. Review of the chart and talking to bedside nurse reveals that he was able to tolerate facial BiPAP only for a few hours overnight. ABG at admission shows baseline chronic respiratory acidosis that got worse early hours this morning. He continues to deal with chronic secretions. Both he and his wife attest that his health is at baseline. While sitting is not having any problems. He is able to tolerate the capped tracheostomy sitting but gets orthopneic when trying to lie down     VITAL SIGNS: Temp:  [97.2 F (36.2 C)-98.7 F (37.1 C)] 97.5 F (36.4 C) (10/19 0744) Pulse Rate:  [66-109] 93 (10/19 1000) Resp:  [12-35] 21 (10/19 1000) BP: (106-150)/(53-109) 132/78 mmHg (10/19 1000) SpO2:  [94 %-100 %] 100 % (10/19 1000) FiO2 (%):  [35 %-40 %] 40 % (10/19 0400) Weight:  [122.471 kg (270 lb)] 122.471 kg (270 lb) (10/19 0500) HEMODYNAMICS:   VENTILATOR  SETTINGS: Vent Mode:  [-] BIPAP;PSV FiO2 (%):  [35 %-40 %] 40 % PEEP:  [5 cmH20] 5 cmH20 Pressure Support:  [11 cmH20] 11 cmH20 INTAKE / OUTPUT:  Intake/Output Summary (Last 24 hours) at 06/02/15 1116 Last data filed at 06/02/15 1000  Gross per 24 hour  Intake    240 ml  Output   2200 ml  Net  -1960 ml    PHYSICAL EXAMINATION: General: MOAAM,  Neuro:  Intact HEENT: Trach cuffed in place, number 6-currently Cardiovascular:  HSD RRR Lungs:  Coarse rhonchi bilat Abdomen: obses+bs Musculoskeletal: intact Skin:  2 + edema lower ext, skin changes  LABS: PULMONARY  Recent Labs Lab 06/01/15 1148 06/01/15 1820 06/01/15 2311 06/02/15 0446  PHART 7.332* 7.261* 7.243* 7.271*  PCO2ART 83.7* 99.6* 103* 93.3*  PO2ART 62.9* 89.7 184* 138*  HCO3 43.1* 43.3* 42.8* 41.6*  TCO2 45.7 46.3 45.9 44.4  O2SAT 90.8 95.5 99.3 98.9    CBC  Recent Labs Lab 06/01/15 1119 06/02/15 0305  HGB 9.0* 9.8*  HCT 32.5* 35.0*  WBC 5.6 4.6  PLT 170 190    COAGULATION  Recent Labs Lab 06/01/15 1119  INR 1.06    CARDIAC  No results for input(s): TROPONINI in the last 168 hours. No results for input(s): PROBNP in the last 168 hours.   CHEMISTRY  Recent Labs Lab 06/01/15 1119 06/02/15 0305  NA 140 139  K 3.9 3.9  CL 88* 86*  CO2 41* 41*  GLUCOSE 111* 126*  BUN 67* 62*  CREATININE 2.18* 2.17*  CALCIUM 9.1 9.1  MG 1.9  --   PHOS 3.9  --    Estimated Creatinine Clearance: 47.8 mL/min (by C-G formula based on Cr of 2.17).   LIVER  Recent Labs Lab 06/01/15 1119  AST 13*  ALT 9*  ALKPHOS 73  BILITOT 0.4  PROT 7.0  ALBUMIN 3.3*  INR 1.06     INFECTIOUS  Recent Labs Lab 06/01/15 1119  LATICACIDVEN 1.1  PROCALCITON 0.14     ENDOCRINE CBG (last 3)   Recent Labs  06/02/15 0003 06/02/15 0353 06/02/15 0733  GLUCAP 125* 122* 114*         IMAGING x48h  - image(s) personally visualized  -   highlighted in bold No results found.      ASSESSMENT  / PLAN:  PULMONARY Trached chronic A: OSA with tracheostomy ddepedence dependence  - 06/02/2015: Worsening respiratory acidosis with facial BiPAP and tracheostomy capped P:   Do not recommend decannulation given worsening respiratory acidosis - he finds himself in a pickle. He wants to think about his goals of care. He clearly expressed desire to continue to live. He says he is not ready to die.  Gave options of downsizing tracheostomy to 4 size versus continuing current quality of life with size 6  - He he will  deliberate about it. He is not interested anymore indecannulation given high risk of death  Meanwhile continue with tracheostomy capped and nocturnal BiPAP through the face  -  ABG in the morning  CT scan of the chest without contrast to reassess findings from April 2016  Continue stepdown care  CARDIOVASCULAR  A:  HTN CAD P:  Antihypertensives Hold Plavix for possible decannulation versus downsizing  RENAL Lab Results  Component Value Date   CREATININE 2.17* 06/02/2015   CREATININE 2.18* 06/01/2015   CREATININE 2.28* 05/07/2015   CREATININE 1.61* 12/03/2013   CREATININE 1.73* 11/12/2013    A:  CRI P:   Follow creatine     ENDOCRINE A:   DM  P:   SSI  NEUROLOGIC A:  No acute issue RASS +1 Complains of insomnia  P:   RASS goal: 1 Hold sedation Restoril tonight 1   FAMILY  - Updates: Pt and wife updated at bedside.      Continue stepdown care   Dr. Brand Males, M.D., [facility].C.P Pulmonary and Critical Care Medicine Staff Physician [place] [facility] Pager: [phone], If no answer or between  15:00h - 7:00h: call [phone]  06/02/2015 11:39 AM

## 2015-06-03 ENCOUNTER — Inpatient Hospital Stay (HOSPITAL_COMMUNITY): Admission: RE | Admit: 2015-06-03 | Payer: Medicare Other | Source: Ambulatory Visit

## 2015-06-03 DIAGNOSIS — G9340 Encephalopathy, unspecified: Secondary | ICD-10-CM

## 2015-06-03 DIAGNOSIS — J9612 Chronic respiratory failure with hypercapnia: Secondary | ICD-10-CM

## 2015-06-03 LAB — BLOOD GAS, ARTERIAL
ACID-BASE EXCESS: 14.3 mmol/L — AB (ref 0.0–2.0)
Acid-Base Excess: 15.5 mmol/L — ABNORMAL HIGH (ref 0.0–2.0)
BICARBONATE: 39.2 meq/L — AB (ref 20.0–24.0)
Bicarbonate: 43.7 mEq/L — ABNORMAL HIGH (ref 20.0–24.0)
DRAWN BY: 39898
Drawn by: 277551
FIO2: 0.3
O2 CONTENT: 4 L/min
O2 SAT: 97 %
O2 Saturation: 98.5 %
PATIENT TEMPERATURE: 98.6
PCO2 ART: 113 mmHg — AB (ref 35.0–45.0)
PEEP: 5 cmH2O
PH ART: 7.212 — AB (ref 7.350–7.450)
PH ART: 7.452 — AB (ref 7.350–7.450)
PO2 ART: 76.9 mmHg — AB (ref 80.0–100.0)
Patient temperature: 98.6
Pressure control: 24 cmH2O
RATE: 20 resp/min
TCO2: 47.2 mmol/L (ref 0–100)
TCO2: 40.9 mmol/L (ref 0–100)
pCO2 arterial: 57 mmHg — ABNORMAL HIGH (ref 35.0–45.0)
pO2, Arterial: 123 mmHg — ABNORMAL HIGH (ref 80.0–100.0)

## 2015-06-03 NOTE — Progress Notes (Signed)
Trach team follow up at this time. No education needed. All emergency equipment at bedside. Attempted trials of capping trach & using bipap to see if decannulation was possible, but pt failed. Full vent support for now according to MD. Will continue to follow.  Kathie Dike RRT

## 2015-06-03 NOTE — Progress Notes (Signed)
PULMONARY / CRITICAL CARE MEDICINE   Name: [name] [name] MRN: [id number] DOB: 1950-10-17    ADMISSION DATE:  06/01/2015   REFERRING MD :  Chase Caller  CHIEF COMPLAINT:  Wants tracheostomy  INITIAL PRESENTATION:  Tracheostomy evaluation  64 yo MO AAM with OSA/OHS and has been tracheostomy dependent. He has a plethora of health issues some of which are, , OSA rch and vet dependent, CAD post CABG, CHF lasix and zaroxolyn dependent, DM, angioedema and non compliance and CKD - June creat 2mg %. He has been evaluated by Dr.s Chase Caller and Redmond Baseman for possible decannulation due to quality of life issues. Of note he was non compliant with bipap prior to tracheostomy. We will admit and admit to cap his tracheostomy at night use bipap to ascertain his ability to survive with out a trach. Of note he cant tolerate PMV due to SOB. He will need more weight loss in future with better control of CHF.  SUBJECTIVE/OVERNIGHT/INTERVAL HX Hypercarbic and slow to respond. Now back on vent  VITAL SIGNS: Temp:  [97.8 F (36.6 C)-98.6 F (37 C)] 97.8 F (36.6 C) (10/20 0831) Pulse Rate:  [78-117] 94 (10/20 0926) Resp:  [15-37] 18 (10/20 0904) BP: (111-166)/(67-122) 116/69 mmHg (10/20 0926) SpO2:  [95 %-100 %] 100 % (10/20 0926) FiO2 (%):  [30 %-41 %] 30 % (10/20 0926) Weight:  [123.1 kg (271 lb 6.2 oz)] 123.1 kg (271 lb 6.2 oz) (10/20 0500) HEMODYNAMICS:   VENTILATOR SETTINGS: Vent Mode:  [-] PCV FiO2 (%):  [30 %-41 %] 30 % Set Rate:  [20 bmp] 20 bmp PEEP:  [5 cmH20-6 cmH20] 5 cmH20 Pressure Support:  [6 cmH20] 6 cmH20 INTAKE / OUTPUT:  Intake/Output Summary (Last 24 hours) at 06/03/15 1740 Last data filed at 06/03/15 0700  Gross per 24 hour  Intake   1200 ml  Output   1700 ml  Net   -500 ml   PHYSICAL EXAMINATION: General: MOAAM, lethargic slow to respond Neuro: lethargic  Head: [place]/AT EENT: Trach cuffed in place, number 6-currently Cardiovascular:  HSD RRR Lungs:  Coarse rhonchi  bilat Abdomen: obses+bs Musculoskeletal: intact Skin:  2 + edema lower ext, skin changes  LABS: PULMONARY  Recent Labs Lab 06/01/15 1148 06/01/15 1820 06/01/15 2311 06/02/15 0446 06/03/15 0440  PHART 7.332* 7.261* 7.243* 7.271* 7.212*  PCO2ART 83.7* 99.6* 103* 93.3* 113*  PO2ART 62.9* 89.7 184* 138* 123*  HCO3 43.1* 43.3* 42.8* 41.6* 43.7*  TCO2 45.7 46.3 45.9 44.4 47.2  O2SAT 90.8 95.5 99.3 98.9 98.5   CBC  Recent Labs Lab 06/01/15 1119 06/02/15 0305  HGB 9.0* 9.8*  HCT 32.5* 35.0*  WBC 5.6 4.6  PLT 170 190   COAGULATION  Recent Labs Lab 06/01/15 1119  INR 1.06   CARDIAC No results for input(s): TROPONINI in the last 168 hours. No results for input(s): PROBNP in the last 168 hours.  CHEMISTRY  Recent Labs Lab 06/01/15 1119 06/02/15 0305  NA 140 139  K 3.9 3.9  CL 88* 86*  CO2 41* 41*  GLUCOSE 111* 126*  BUN 67* 62*  CREATININE 2.18* 2.17*  CALCIUM 9.1 9.1  MG 1.9  --   PHOS 3.9  --    Estimated Creatinine Clearance: 47.9 mL/min (by C-G formula based on Cr of 2.17).   LIVER  Recent Labs Lab 06/01/15 1119  AST 13*  ALT 9*  ALKPHOS 73  BILITOT 0.4  PROT 7.0  ALBUMIN 3.3*  INR 1.06   INFECTIOUS  Recent Labs Lab 06/01/15  1119  LATICACIDVEN 1.1  PROCALCITON 0.14   ENDOCRINE CBG (last 3)   Recent Labs  06/02/15 0353 06/02/15 0733 06/02/15 1132  GLUCAP 122* 114* 146*   IMAGING x48h  - image(s) personally visualized  -   highlighted in bold Ct Chest Wo Contrast  06/02/2015  CLINICAL DATA:  Chronic respiratory failure. EXAM: CT CHEST WITHOUT CONTRAST TECHNIQUE: Multidetector CT imaging of the chest was performed following the standard protocol without IV contrast. COMPARISON:  Chest CT 11/17/2014 FINDINGS: Mediastinum/Nodes: Tracheostomy tube is in place. Good position without complicating features. Stable thyroid goiter. The heart is enlarged but stable. No pericardial effusion. Stable surgical changes from bypass surgery.  Extensive 3 vessel coronary artery calcifications. The aorta is normal in caliber. Moderate tortuosity and scattered atherosclerotic calcifications. Stable borderline enlarged mediastinal and hilar lymph nodes. No mass. The esophagus is grossly normal. Lungs/Pleura: Extensive bilateral pleural thickening and dense calcification with loculated complex pleural fluid. Findings likely due to prior trauma or infection or pleurodesis. Significant breathing motion artifact. No obvious pulmonary nodules. No focal infiltrates or effusions. Bibasilar atelectasis. Stable emphysematous changes. Upper abdomen: Stable cholelithiasis. No upper abdominal mass or adenopathy. Musculoskeletal: No significant bony findings. IMPRESSION: 1. Tracheostomy tube remains in good position without complicating features. 2. Enlarged thyroid goiter, stable. 3. Stable cardiac enlargement.  No pericardial effusion. 4. Stable surgical changes from bypass surgery and extensive 3 vessel coronary artery calcifications. 5. Stable borderline enlarged mediastinal and hilar lymph nodes. 6. Stable extensive bilateral pleural thickening with dense pleural calcifications and loculated complex pleural fluid, likely due to prior pleurodesis, infection or trauma. 7. Stable emphysematous changes. No definite acute overlying pulmonary process other than bibasilar atelectasis. Electronically Signed   By: Marijo Sanes M.D.   On: 06/02/2015 15:39   ASSESSMENT / PLAN:  OSA/OHS with tracheostomy depedence dependence Acute on Chronic hypercarbic respiratory failure  Chronic pleural thickening and calcification  - 06/02/2015 & 10/20:  Worsening respiratory acidosis with facial BiPAP and tracheostomy capped. Think that we can consider this failed trial.  - Do not recommend decannulation given worsening respiratory acidosis Plan Vent support for now; then resume nocturnal ventilation Will need a day or so to recover from progressive respiratory muscle fatigue.  Hope to eventually get him back home w/ his home regimen in next 50-93 hrs but certainly no decannulation.   Acute hypercarbic encephalopathy  Plan Ventilatory support Hold sedation  Supportive care.   HTN CAD Plan:  Antihypertensives Hold Plavix for possible decannulation versus downsizing  CRI-->stable Plan:   Follow creatine  DM  Plan:   SSI  FAMILY  - Updates: Pt and wife updated at bedside.  Erick Colace ACNP-BC [place] Pager # [phone] OR # [phone] if no answer  Attending Note:  64 year old male with PMH of OSA and OHV with a tracheostomy due to non-compliance with BiPAP.  Was admitted to the hospital for possible decannulation due to quality of life issues.  Patient has not done well with capping trials.  This AM his pH is down to 7.21 and CO2 up to 113.  Patient has clearly demonstrated that he can not be decannulated.  Needs significant weight loss prior to any consideration of that.  Will place back on full support right now and increase minute ventilation to decrease CO2 since if left as is he will not survive.  He did not tolerate BiPAP (low volumes) so will need full support.  Will hold here for now with any further deterioration  in mental status will transfer back to the ICU.  Mandatory BiPAP at night.  The patient is critically ill with multiple organ systems failure and requires high complexity decision making for assessment and support, frequent evaluation and titration of therapies, application of advanced monitoring technologies and extensive interpretation of multiple databases.   Critical Care Time devoted to patient care services described in this note is  35  Minutes. This time reflects time of care of this signee Dr Jennet Maduro. This critical care time does not reflect procedure time, or teaching time or supervisory time of PA/NP/Med student/Med Resident etc but could involve care discussion time.  Rush Farmer,  M.D. [facility] Pulmonary/Critical Care Medicine. Pager: [phone]. After hours pager: [phone].  06/03/2015 9:38 AM

## 2015-06-03 NOTE — Progress Notes (Signed)
Placed pt on vent per MD

## 2015-06-03 NOTE — Progress Notes (Signed)
CRITICAL VALUE ALERT  Critical value received:  PH 7.21, PCO2 113, PO2 123, Bicarb 43.7  Date of notification:  06/03/2015  Time of notification:  0158  Critical value read back: Yes  Nurse who received alert:  Eulis Foster   MD notified (1st page):  E-link  Time of first page:  0445  MD notified (2nd page):  Time of second page:  Responding MD: Oletta Darter  Time MD responded:  [phone]. BiPAP initiated

## 2015-06-03 NOTE — Telephone Encounter (Signed)
Pt currently admitted.  Will close message.

## 2015-06-04 DIAGNOSIS — Z930 Tracheostomy status: Secondary | ICD-10-CM

## 2015-06-04 DIAGNOSIS — J9611 Chronic respiratory failure with hypoxia: Secondary | ICD-10-CM

## 2015-06-04 DIAGNOSIS — I110 Hypertensive heart disease with heart failure: Secondary | ICD-10-CM

## 2015-06-04 LAB — BLOOD GAS, ARTERIAL
Acid-Base Excess: 14.6 mmol/L — ABNORMAL HIGH (ref 0.0–2.0)
BICARBONATE: 39 meq/L — AB (ref 20.0–24.0)
Drawn by: 36496
FIO2: 0.3
LHR: 20 {breaths}/min
O2 SAT: 99.8 %
PATIENT TEMPERATURE: 98.6
PEEP/CPAP: 5 cmH2O
PH ART: 7.502 — AB (ref 7.350–7.450)
PRESSURE CONTROL: 20 cmH2O
TCO2: 40.6 mmol/L (ref 0–100)
pCO2 arterial: 50.2 mmHg — ABNORMAL HIGH (ref 35.0–45.0)
pO2, Arterial: 182 mmHg — ABNORMAL HIGH (ref 80.0–100.0)

## 2015-06-04 NOTE — Progress Notes (Signed)
Patient given discharge instructions. Patient given appt dates/times. Patient verbalize understanding.

## 2015-06-04 NOTE — Discharge Summary (Signed)
Physician Discharge Summary       Patient ID: [name] [name] MRN: [id number] DOB/AGE: 11-14-1950 64 y.o.  Admit date: 06/01/2015 Discharge date: 06/04/2015  Discharge Diagnoses:   OSA/OHS with tracheostomy depedence dependence Acute on Chronic hypercarbic respiratory failure  Chronic pleural thickening and calcification  Acute hypercarbic encephalopathy  HTN CAD CRI stage III DM   Detailed Hospital Course:  64 yo MO AAM with OSA/OHS and has been tracheostomy dependent. He has a plethora of health issues some of which are, OSA, OHS, chronic resp failure and nocturnal vent dependence, CAD post CABG, CHF lasix and zaroxolyn dependent, DM, angioedema and non compliance and CKD - June creat 2mg %. He has been evaluated by Dr.s Chase Caller and Redmond Baseman for possible decannulation due to quality of life issues. Of note he was non compliant with bipap prior to tracheostomy. He was admitted to cap his tracheostomy at night use bipap to ascertain his ability to survive with out a trach. Of note he can't tolerate PMV due to SOB. He will need more weight loss in future with better control of CHF. He was initially seen in the intensive care. We tried trials of red-capping and nocturnal BIPAP (full face mask) on both 10/19 and 10/20. Both of these days he had significant worsening respiratory acidosis. His calculated baseline PCO2 is in 70s. His PCO2 was > 100 w/ decreased level of consciousness after these trials. We considered this a failed trial and discussed this at length with him. He and his wife came to terms with the reality of the situation and agreed on continuing with life long trach and nocturnal ventilation as previously recommended. His course was prolonged by the episodes of acute on chronic hypercarbic respiratory failure which required [place] to fully ventilate him in the hospital due to progressive hypercarbia. At time of discharge he is currently at baseline. He will be discharged to home  with instructions to resume his prior regimen.     Discharge Plan by active problems  OSA/OHS with tracheostomy depedence dependence Chronic pleural thickening and calcification  - Do not recommend decannulation given worsening respiratory acidosis Plan Resume all previous home therapies F/u with Dr Chase Caller    HTN CAD Plan:  Antihypertensives Hold Plavix for possible decannulation versus downsizing  CRI-->stable stage III Plan:  Follow creatine w/ PCP   DM  Plan:  Diet controlled    Significant Hospital tests/ studies  Consults   Discharge Exam: BP 107/57 mmHg  Pulse 104  Temp(Src) 98.2 F (36.8 C) (Oral)  Resp 21  Ht 6\' 1"  (1.854 m)  Wt 123.1 kg (271 lb 6.2 oz)  BMI 35.81 kg/m2  SpO2 98%  General: MOAAM, fully awake and anxious to go home  Neuro: no focal def  Head: [place]/AT EENT: Trach cuffed in place, number 6-currently-->thin frothy secretions at baseline  Cardiovascular: HSD RRR Lungs: Coarse rhonchi bilat-->improve w/ cough. Phonates w/out PMV Abdomen: obses+bs Musculoskeletal: intact Skin: 2 + edema lower ext, skin changes  Labs at discharge Lab Results  Component Value Date   CREATININE 2.17* 06/02/2015   BUN 62* 06/02/2015   NA 139 06/02/2015   K 3.9 06/02/2015   CL 86* 06/02/2015   CO2 41* 06/02/2015   Lab Results  Component Value Date   WBC 4.6 06/02/2015   HGB 9.8* 06/02/2015   HCT 35.0* 06/02/2015   MCV 95.1 06/02/2015   PLT 190 06/02/2015   Lab Results  Component Value Date   ALT 9* 06/01/2015   AST 13*  06/01/2015   ALKPHOS 73 06/01/2015   BILITOT 0.4 06/01/2015   Lab Results  Component Value Date   INR 1.06 06/01/2015   INR 0.99 11/17/2014   INR 1.11 06/04/2013   CBG (last 3)   Recent Labs  06/02/15 0353 06/02/15 0733 06/02/15 1132  GLUCAP 122* 114* 146*    Current radiology studies Ct Chest Wo Contrast  06/02/2015  CLINICAL DATA:  Chronic respiratory failure. EXAM: CT CHEST WITHOUT CONTRAST  TECHNIQUE: Multidetector CT imaging of the chest was performed following the standard protocol without IV contrast. COMPARISON:  Chest CT 11/17/2014 FINDINGS: Mediastinum/Nodes: Tracheostomy tube is in place. Good position without complicating features. Stable thyroid goiter. The heart is enlarged but stable. No pericardial effusion. Stable surgical changes from bypass surgery. Extensive 3 vessel coronary artery calcifications. The aorta is normal in caliber. Moderate tortuosity and scattered atherosclerotic calcifications. Stable borderline enlarged mediastinal and hilar lymph nodes. No mass. The esophagus is grossly normal. Lungs/Pleura: Extensive bilateral pleural thickening and dense calcification with loculated complex pleural fluid. Findings likely due to prior trauma or infection or pleurodesis. Significant breathing motion artifact. No obvious pulmonary nodules. No focal infiltrates or effusions. Bibasilar atelectasis. Stable emphysematous changes. Upper abdomen: Stable cholelithiasis. No upper abdominal mass or adenopathy. Musculoskeletal: No significant bony findings. IMPRESSION: 1. Tracheostomy tube remains in good position without complicating features. 2. Enlarged thyroid goiter, stable. 3. Stable cardiac enlargement.  No pericardial effusion. 4. Stable surgical changes from bypass surgery and extensive 3 vessel coronary artery calcifications. 5. Stable borderline enlarged mediastinal and hilar lymph nodes. 6. Stable extensive bilateral pleural thickening with dense pleural calcifications and loculated complex pleural fluid, likely due to prior pleurodesis, infection or trauma. 7. Stable emphysematous changes. No definite acute overlying pulmonary process other than bibasilar atelectasis. Electronically Signed   By: Marijo Sanes M.D.   On: 06/02/2015 15:39    Disposition:  01-Home or Self Care      Discharge Instructions    DME Other see comment    Complete by:  As directed   Continue home  vent settings prior to admit Continue routine trach care            Medication List    TAKE these medications        acetaminophen 500 MG tablet  Commonly known as:  TYLENOL  Take 500 mg by mouth every 6 (six) hours as needed for mild pain or moderate pain.     albuterol (2.5 MG/3ML) 0.083% nebulizer solution  Commonly known as:  PROVENTIL  Take 3 mLs (2.5 mg total) by nebulization every 4 (four) hours as needed for wheezing.     allopurinol 100 MG tablet  Commonly known as:  ZYLOPRIM  Take 1 tablet by mouth daily.     arformoterol 15 MCG/2ML Nebu  Commonly known as:  BROVANA  Take 2 mLs (15 mcg total) by nebulization 2 (two) times daily.     aspirin 81 MG tablet  Take 81 mg by mouth daily.     budesonide 0.5 MG/2ML nebulizer solution  Commonly known as:  PULMICORT  Take 0.5 mg by nebulization 2 (two) times daily.     COLCRYS 0.6 MG tablet  Generic drug:  colchicine  Take 0.6 mg by mouth daily as needed.     dextromethorphan-guaiFENesin 30-600 MG 12hr tablet  Commonly known as:  MUCINEX DM  Take 1 tablet by mouth 2 (two) times daily. Patient takes for mucus relief     furosemide 40 MG tablet  Commonly known as:  LASIX  Take 2 tablets in AM, 1 tablet in PM     metolazone 2.5 MG tablet  Commonly known as:  ZAROXOLYN  TAKE ONE TABLET BY MOUTH ONCE DAILY ON  MONDAY,  WEDNESDAY,  AND  FRIDAY  30  MINUTES  BEFORE  LASIX     nitroGLYCERIN 0.4 MG SL tablet  Commonly known as:  NITROSTAT  Place 1 tablet (0.4 mg total) under the tongue every 5 (five) minutes as needed. For chest pain       Follow-up Information    Follow up with [facility], MD On 08/18/2015.   Specialty:  Pulmonary Disease   Why:  230pm   Contact information:   [place] [address] [phone]       Follow up with PARRETT,TAMMY, NP On 06/25/2015.   Specialty:  Pulmonary Disease   Why:  9am   Contact information:   [address] [address] [phone]        Discharged Condition: good  Physician Statement:   The Patient was personally examined, the discharge assessment and plan has been personally reviewed and I agree with ACNP Babcock's assessment and plan. > 30 minutes of time have been dedicated to discharge assessment, planning and discharge instructions.   Signed: BABCOCK,PETE 06/04/2015, 10:06 AM  Patient seen and examined, agree with above note.  I dictated the care and orders written for this patient under my direction.  Rush Farmer, MD [phone]

## 2015-06-04 NOTE — Progress Notes (Signed)
Placed pt on atc 35 % -pt tol well

## 2015-06-04 NOTE — Care Management Important Message (Signed)
Important Message  Patient Details  Name: [name] [name] MRN: [id number] Date of Birth: 1951-07-24   Medicare Important Message Given:  Yes-second notification given    Nathen May 06/04/2015, 11:39 AM

## 2015-06-06 DIAGNOSIS — R0602 Shortness of breath: Secondary | ICD-10-CM | POA: Diagnosis not present

## 2015-06-09 ENCOUNTER — Encounter (HOSPITAL_COMMUNITY): Payer: Medicare Other

## 2015-06-23 DIAGNOSIS — N183 Chronic kidney disease, stage 3 (moderate): Secondary | ICD-10-CM | POA: Diagnosis not present

## 2015-06-23 DIAGNOSIS — I129 Hypertensive chronic kidney disease with stage 1 through stage 4 chronic kidney disease, or unspecified chronic kidney disease: Secondary | ICD-10-CM | POA: Diagnosis not present

## 2015-06-23 DIAGNOSIS — N189 Chronic kidney disease, unspecified: Secondary | ICD-10-CM | POA: Diagnosis not present

## 2015-06-23 DIAGNOSIS — N179 Acute kidney failure, unspecified: Secondary | ICD-10-CM | POA: Diagnosis not present

## 2015-06-23 DIAGNOSIS — N2581 Secondary hyperparathyroidism of renal origin: Secondary | ICD-10-CM | POA: Diagnosis not present

## 2015-06-23 DIAGNOSIS — D631 Anemia in chronic kidney disease: Secondary | ICD-10-CM | POA: Diagnosis not present

## 2015-06-23 DIAGNOSIS — R809 Proteinuria, unspecified: Secondary | ICD-10-CM | POA: Diagnosis not present

## 2015-06-24 DIAGNOSIS — E662 Morbid (severe) obesity with alveolar hypoventilation: Secondary | ICD-10-CM | POA: Diagnosis not present

## 2015-06-24 DIAGNOSIS — Z930 Tracheostomy status: Secondary | ICD-10-CM | POA: Diagnosis not present

## 2015-06-24 DIAGNOSIS — J449 Chronic obstructive pulmonary disease, unspecified: Secondary | ICD-10-CM | POA: Diagnosis not present

## 2015-06-24 DIAGNOSIS — G4733 Obstructive sleep apnea (adult) (pediatric): Secondary | ICD-10-CM | POA: Diagnosis not present

## 2015-06-25 ENCOUNTER — Encounter: Payer: Self-pay | Admitting: Adult Health

## 2015-06-25 ENCOUNTER — Ambulatory Visit (INDEPENDENT_AMBULATORY_CARE_PROVIDER_SITE_OTHER): Payer: Medicare Other | Admitting: Adult Health

## 2015-06-25 VITALS — BP 118/64 | HR 92 | Temp 98.7°F | Ht 73.0 in | Wt 276.0 lb

## 2015-06-25 DIAGNOSIS — J449 Chronic obstructive pulmonary disease, unspecified: Secondary | ICD-10-CM

## 2015-06-25 DIAGNOSIS — J9611 Chronic respiratory failure with hypoxia: Secondary | ICD-10-CM

## 2015-06-25 DIAGNOSIS — J9612 Chronic respiratory failure with hypercapnia: Secondary | ICD-10-CM | POA: Diagnosis not present

## 2015-06-25 DIAGNOSIS — Z930 Tracheostomy status: Secondary | ICD-10-CM | POA: Diagnosis not present

## 2015-06-25 NOTE — Progress Notes (Signed)
Subjective:    Patient ID: [name] [name], male    DOB: 1951-01-17, 64 y.o.   MRN: [id number]  HPI    HPI 01/23/11- COPD, CAD/diastolic dysfunction, OSA, chronic respiratory failure, obesity Hypoventilation, cor pulmonale Hosp since last here- once for "shakes"-they told him wasn't using CPAP right, once for epistaxis, once to place urinary stent, and has had a right ? renal biopsy. Told anemic.  Breathing stable, but can't lie flat- feels smothered even on O2.  Runs O2 2-3L/M usually. I gave permision to go to 4 during exertion. Notes "tiresome" feeling midchest, related to exertion/ climbing stairs and relieved by rest. Told to pace himself and not try to push through that.  Discussed need for O2. He doesn't sleep well with his BiPAP 15/12. Discussed sleep hygiene- discussed room temperature. He was more comfortable with autotitration in hosp and we discussed a trial of that at home.  Has restarted diuretic since leg edema has come back.   05/25/11-  COPD, CAD/diastolic dysfunction, OSA, chronic respiratory failure, obesity Hypoventilation, cor pulmonale Went to ER last week- short of breath. Air wasn't satisfying. Was started back on shot for anemia. Feels some better.  Reports sneezing, postnasal drainage and a sense of mucus in his upper chest. Denies fever, sore throat and doesn't think he has a cold.  06/28/11-  COPD, CAD/diastolic dysfunction, OSA, chronic respiratory failure, obesity Hypoventilation, cor pulmonale Recently hospitalized October 25-29, notes reviewed with him and x-ray images reviewed by me. He was hospitalized for hypercapnic respiratory failure with transient encephalopathy and renal insufficiency. Since discharge he has regained some ankle edema while off of furosemide. He has a nephrology appointment later this month. He is concerned that he was taken off his diabetes medicines and is directed to discuss this with his primary physician immediately. He has some  persistent soreness across his anterior chest wall at the level of the xiphoid but otherwise breathing better with less exertional dyspnea and before he went in the hospital. He remains dependent on continuous oxygen at 2 L. He continues his BiPAP machine all night, every night but says he was sleeping better with it  on autotitration with a lower pressure used in the hospital.  09/25/11- COPD, CAD/diastolic dysfunction, OSA, chronic respiratory failure, obesity Hypoventilation, cor pulmonale Since last here he was hospitalized for respiratory failure with CHF and obesity hypoventilation. Better with diuresis. BiPAP 13/10 is now comfortable and used all night every night with supplemental oxygen 4 L. Tolerated colonoscopy without respiratory distress. Uses a rescue inhaler only occasionally but says it does help then.  11/23/11- COPD, CAD/diastolic dysfunction, OSA, chronic respiratory failure, obesity Hypoventilation, cor pulmonale She is noticing some increased shortness of breath on exertion such as getting in the car but no increase in ankle edema which is well controlled. Occasional cough is not progressive or productive. Noticing more rhinorrhea with the pollen. Continues BiPAP  I 13/E 10 all night every night.  02/07/12- COPD, CAD/diastolic dysfunction, OSA, chronic respiratory failure, obesity Hypoventilation, cor pulmonale  pt states doing some better.wakes up out of a nap coughing feels like he's choking.  Post Hosp- 01/14/12- 01/23/12-discharge diagnoses reviewed with him: Coronary artery disease/CABG, diastolic dysfunction, COPD with chronic respiratory failure, acute respiratory failure with hypoxia, acute on chronic renal failure, DM. He was intubated for 4 days and treated with Zosyn, vancomycin and Avelox, Levaquin, Zithromax and Rocephin. Cultures negative. He was to wean off of prednisone over 3 days. He was hoarse after intubation but that  has resolved. He is using oxygen 1-2 L and BiPAP   13/10 at night. He feels he is pretty close to his baseline.  03/28/12-  COPD, CAD/diastolic dysfunction/ chronic CHF, OSA, chronic respiratory failure, obesity Hypoventilation, cor pulmonale  6 MWT today. Pt states breathing has been "okay" since last OV. Pt states having trouble falling alseep-not sleeping well on BiPAP. Pt states mask fit well but thinks the pressure needs to be adjusted again. Currently 13/10+ oxygen at 2 L/ Advanced. He sleeps better in a recliner with oxygen but without his BiPAP. Getting iron injections for anemia. We discussed shortness of breath and anemia. Pain left anterior axillary line described as a sore ache over the past week. He is able to raise his arm. Thinks he maybe lifted something wrong. COPD assessment test (CAT) 21/40 CXR 01/14/12- 1 view- IMPRESSION:  Stable support apparatus. Residual mild interstitial prominence  with slight improvement in aeration. No convincing pulmonary  edema. Probable small left pleural effusion with left basilar  atelectasis or infiltrate.  Original Report Authenticated By: [name], M.D.   03/08/35-UYQI, CAD/diastolic dysfunction/ chronic CHF, OSA, chronic respiratory failure, obesity Hypoventilation, cor pulmonale Hospitalized again between March 9 and 12 for acute on chronic renal failure with congestive heart failure, chronic respiratory failure on BiPAP and acute respiratory failure with hypoxia. He says he is feeling better at this time. Continues oxygen 3 L/Advanced. He has been using BiPAP 13/10 but thinks he slept better with CPAP. We discussed options. Usually when he decompensates it is from fluid overload triggering CO2 retention and hypoventilation.  CXR 10/20/12 IMPRESSION:  Vascular congestion and cardiomegaly, with small bilateral pleural  effusions and bibasilar airspace opacification. Findings are most  compatible with recurrent pulmonary edema, though underlying  pneumonia cannot be excluded.  Original Report  Authenticated By: [name], M.D.  12/02/2012 [facility] follow up  Returns for a post hospital follow up .  Reports doing well overall but still having some SOB, thinks this may be due to his pressure not being high enough on BIPAP. Since decreasing BIPAP 1 month ago  down to 12/10 he does not feel as good. Trouble sleeping.  Was admitted for decompensated cor pulmonale w/ fluid overload with subsequent acute on chronic hypercarbic resp failure.  He admits he can not afford his advair and spiriva- therefore he is not taking.  He does not wear BIPAP everynight. We discussed the implications of this and untreated OHS/OSA. He naps a lot but does not wear BIPAP.  He was treated with diuresis with improvement.  Discharged on Lasix 40mg  . Lower dose due to bump in scr. Has OP follow up with Renal d/t renal insufficiency.   02/05/13- COPD, CAD/diastolic dysfunction/ chronic CHF, OSA, chronic respiratory failure, obesity Hypoventilation, cor pulmonale Hosp 6/16-6/19/14 Acute on Chronic Resp Failure Discharge Plan:  -pt educated on BiPAP compliance at home  -continue pulmicort, scheduled albuterol / atrovent (med cost has been an issue)  -wean prednisone to off  -given information for [employer] at [employer]. Pulmonary Rehab was a cost issue (45$ per visit)  -RN home health to review medications and reinforce need for BiPAP 12/12 wO2 2-3L for sleep, O2 3-4 awake.  FOLLOWS FOR: Breathing has improved since leaving the hospital. Reports DOE, chest tightness with exertion and slight coughing from time to time. Wife needs FMLA form. CXR 01/29/13 1View IMPRESSION:  Slightly improved aeration in the lungs may represent decreasing  edema.  Original Report Authenticated By: Markus Daft, M.  04/11/13- COPD, CAD/diastolic dysfunction/ chronic CHF, OSA, chronic respiratory failure, obesity Hypoventilation, cor pulmonale FOLLOWS FOR: reports breathing is essentially unchanged but he feels like he's  "burning through more oxygen than I used to." denies wheezing, increased SOB, tightness, increased cough, f/c/s Just in hospital again acute on chronic respiratory failure with CHF and COPD. Today he feels well except for frontal headache. Continuing oxygen at 2.5 L. BIPAP Auto 5-15/ O2 2L/ Advanced CXR 1V 8/14 IMPRESSION:  1. Extubated, enteric tube removed. Stable lung volumes.  2. Stable ventilation. Small pleural effusions.  Electronically Signed  By: Lars Pinks  On: 04/02/2013 07:43  06/23/2013 [facility] follow up  Patient presents for a post hospital followup. Patient was admitted October 7 through October 30 for acute on chronic respiratory failure. Patient did require intubation for airway protection in the emergency room due to acute encephalopathy. Unfortunately, patient was unable to be weaned from the ventilator and underwent a tracheostomy. Patient was weaned to trach collar during the daytime and vent support. At bedtime. Lurline Idol was placed by ENT Dr. Redmond Baseman. He was transitioned to an extra long #6 trach prior to discharge.  Patient returns today accompanied by his wife. Patient reports that he has been doing okay. He strength. He is tolerating vent support at night. However, he does feel that this is uncomfortable in certain times. Vent settings at night are PRVC at a rate of 14 with a tidal volume of 450 cc with FiO2 of 40%, and a PEEP of 5. Patient has been using Passy-Muir valve for speech however, over the last couple days, feels it's been more difficulties than his usual. He does have follow with ENT later this week for trach evaluation. Patient denies any hemoptysis, trach site. Bleeding, Orthopnea, PND, or increased leg swelling. Has been eating okay without any noted , dysphagia. Appetite is decreased with no nausea, vomiting, diarrhea.  REC Continue on current regimen.  follow up with ENT this week for [facility] evaluation.  I will call with labs and xray results.    Continue on Trach Collar during daytime  Continue on Vent support At bedtime   Follow up Dr. Chase Caller in 6-8 weeks and As needed .  Please contact office for sooner follow up if symptoms do not improve or worsen or seek emergency care   Late Add : ? Right sided infiltrate  Add Levaquin 500mg  daily x 7 days #7 , no refills.    OV 07/21/2013 Followup chronic respiratory failure status post tracheostomy. Since seeing my nurse practitioner a month ago he is doing well. Uses ventilator at night and daytime is on tracheostomy collar. He is not compliant with his Passy-Muir valve because it gives him a sensation of suffocation. But he is able to talk with open tracheostomy. He still reports chronic stable dyspnea on exertion for walking a block or sometimes half a block only. This is relieved by rest. Rated as moderate. There is no associated chest pain no worsening cough or phlegm. He did have some hemoptysis yesterday and he is trying to get in contact with the ENT specialist. As noted just beside bleeding. He denies any orthopnea or paroxysmal nocturnal dyspnea. Eating okay.  Lab review I do note that is not had a CT scan of the chest since 2012 rec Glad you are doing well CMA will refer you to pumonary rehab on basis of chronic resp failure Please do CT scan chest for pleural fibrosis, pleural plaques Followup with ENT> Dr Redmond Baseman, changed trach  on 12/15  07/31/2013 f/u ov/Wert re: worse since 07/24/13 (day of CT Chest - see below) Chief Complaint  Patient presents with  . Acute Visit    Pt c/o increased SOB for the past several days. He states gets out of breath with any sort of exertion.  He has minimal cough that is non prod.   mucus is clear/ not bloody Baseline use of alb not much at all,  One every few days at most Assoc with increased fluid in legs  But followed by Gwenlyn Found who adjusts his lasix  No obvious patterns in day to day or daytime variabilty or assoc chronic cough or cp or  chest tightness, subjective wheeze overt sinus or hb symptoms. No unusual exp hx or h/o childhood pna/ asthma or knowledge of premature birth.  Sleeping ok without nocturnal  or early am exacerbation  of respiratory  c/o's or need for noct saba. Also denies any obvious fluctuation of symptoms with weather or environmental changes or other aggravating or alleviating factors except as outlined above    09/15/2013 Follow up  Returns for a 2 month follow up for COPD, chronic resp failure -trach dependent.  Reports breathing has worsened x2 weeks w/ increased SOB, chest tightness, cough with white mucus. Coughing and congestion make it difficult to use vent at night . Denies any f/c/s, nausea, vomiting, hemoptysis, edema.  Was referred to pulmonary rehab however could not afford co-pay amounts. We discussed looking into pt assistance.    OV 10/28/2013  Chief Complaint  Patient presents with  . Follow-up    Pt is c/o rattling in his chest, and dry cough. Pt also c/o increased swelling in both ankles.     Followup chronic respiratory failure status post tracheostomy by ENT. Chronic respiratory failure on account of COPD, OSA, diastolic heart failure  - Overall he is stable. He reports using the ventilator at night but occasionally he is noncompliant. When he does not use the ventilator the next day he is more fatigued according to his wife. In the daytime he is on trach collar on room air of liters of oxygen. He occasionally uses his Passy-Muir valve for talk but otherwise feels a sense of suffocation. The last few weeks he's noticed some increased pedal edema and despite increasing and self titrating his Lasix. In association with this he is more rattling in his chest and more shortness of breath and cough but there is no change in sputum color. The sputum is not pink and frothy. He has not seen his cardiologist Dr.? Dr Adora Fridge a while and he plans to see him more expeditiously. Denies any chest pain  cough fever chills or wheezing.  Copd appears stable However, diastolic chf might be worse My office will help you seen by Dr Gwenlyn Found office next day or two  REturn to see me in 4 months or sooner if needed  - continue trach care through ENT clinic   OV 02/24/2014  Chief Complaint  Patient presents with  . Follow-up    Pt c/o increase SOB, cough with clear mucous, intermittent edema in BIL ankles. C/o upper mid sternal CP and left upper CP when on ventilator.    Followup chronic respiratory failure on account of obesity, sleep apnea, COPD and diastolic heart failure  - He continues to feel poorly. For the last month or so he produces increased shortness of breath associated with worsening pedal edema. Is only on Lasix 40 mg once daily. He says he  is compliant with his nebulizers. Although, he is noncompliant with his ventilator stating that the pressures are too high. He probably uses it less than 50% of the time. In addition he is very keen on getting his tracheostomy removed permanently this is despite our advice that he keep it in permanently. He is frustrated by his quality of life. I've explained to him that weight loss is paramount but he is not too keen on this. He reports that his cough, sputum or in sputum color or roughly the same as baseline without any fever >>pred taper , increased lasix 40mg  Twice daily    03/10/14 Follow up COPD/OHS/ Trach Depend RF /Noct Vent Returns for follow up for COPD . Seen last ov with COPD flare and suspected decompensated Diastolic CHF. He was treated with prednisone taper and diuretics were increased.  He says he is some better but gets winded easily.  Encouraged to use vent at night .  Leg swelling slightly better. Labs last week showed scr stable.  No chest pain , hemoptyiss, fever or n/vd/     O 05/12/2014  Chief Complaint  Patient presents with  . Follow-up    Pt states his breathing has slightly worsened since last OV. Pt states he has a  slight increase in SOB, non prod cough and chest pain when coming off the vent in the morning.    Followup chronic respiratory failure on account of obesity, sleep apnea, COPD and diastolic heart failure and Trach Depend RF /Noct Vent   - presents with wife. Continues to report frustration having trach in life; more resigned to having it. He is frustrated by class 3 exertional fatigue and dyspnea. REcently hgb 6.3gm% at renal; no obvious bleed and renal managing it but dyspnea is chronic. Noc hange in baseline cough and white sputum of moderate intensity. Continues vent at night; more compliant.  Uptodate with flu shot. He has cards appt pending in  <  2 months per hx   12/10/2014 [facility] follow up  Patient presents for a post hospital follow-up Patient was admitted April 5 through April 7 for pneumonia and acute on chronic hypercarbic respiratory failure Influenza panel was negative. He was treated with IV antibiotics, steroids, and nebulized bronchodilators. CT chest showed a right middle lobe pneumonia. Patient remains on trach collar 4.5L  during the daytime with vent support at night. He says since discharge he is feeling some better. He still continues to feel weak. He denies any hemoptysis, orthopnea, PND or leg swelling Seen by ENT with trach change  Last week.  Remains on Pulmicort and brovana nebs.  Wt is down from discharge (~6 lbs ) , decreased leg swelling.  Does tell me he turns up O2 to 6-7 L when he feels sob even though O2 sats >90%.  . We discussed O2 sats gaol of 90-92%.    01/22/2015 Follow up: COPD /trach dependent , noct vent  Returns for 2 week follow up.  Patient was given Cipro last visit for persistent cough and congestion. Last ov lasix increased for fluid overload.  Reports breathing is improved since last ov, cough is now clear, still having some occasional tightness when coming off the vent. Overall feels better.   CXR shows slight improvement in  aeration today  Wt is down .Leg swelling is less.   Going to ENT to discuss trach options-would prefer to not have trach.   He denies any chest pain, orthopnea, PND or increased  leg swelling  OV 03/10/2015  Chief Complaint  Patient presents with  . Follow-up    Still having SOB and coughing up mostly clear thick mucus with occ green mucus.     Followup chronic respiratory failure on account of obesity, sleep apnea, COPD and diastolic heart failure   Is improved from his recent exacerbation with H. influenzae.. Currently he is here with his wife. Both at test that he is doing well. He uses nocturnal ventilation. Daytime tracheostomy with Passy-Muir valve. He is very adamant that he wants to get his tracheostomy removed no matter what the consequences are. Currently feeling fine. Baseline chronic venous stasis edema present. There are no acute complaints. >>discussed with ENT to set up for trial of trach capping   .04/14/2015 Follow up : COPD /trach dependent , noct vent , OSA/OHS, Diastolic CHF  Pt returns for 1 month follow up  Complains of increased cough and congestion Pt c/o SOB and wheezing at times, chest congestion with a prod cough with light green/yellow mucus.  Denies any chest tightness, fever, or nausea and vomitting. Pt has not made appt with ENT for trach capping.  Pt was suppose to see ENT to be set up for a trial of Trach capping for 4 weeks . Then to decide if able to decanulate.  Would like [place] to call over and get this set up .  Remains on Brovana and Pulmicort .  On Oxygen 4l/m in daytime with trach collar and Vent at At bedtime  .  No chest pain, orthopnea, edema or fever   Recently moved. Starting to get settled in.    06/25/2015 [facility] follow up : COPD /trach dependent , noct vent , OSA/OHS, Diastolic CHF  Patient presents for a post hospital follow-up Patient was admitted 18th to October 21 for acute on chronic hypercarbic respiratory failure. He  was admitted to evaluate for possible decannulation.  Trials of trach capping with nocturnal BiPAP were tried, however, patient has significant worsening respiratory acidosis with PCO2's greater than 100, with associated decreased level of consciousness. Long discussion with patient and wife that this is not a good idea and he should continue with tracheostomy. Since discharge he is doing okay. Remains on trach collar daytime and vent At bedtime   Cough , congestion are at baseline.  Gets winded easily but at his baseline He is sad about trach, support provided.  He denies chest pain, orthopnea , fever or hemoptysis   Review of Systems  Constitutional: Negative for fever and unexpected weight change.  HENT: Negative for congestion, dental problem, ear pain, nosebleeds, postnasal drip, rhinorrhea, sinus pressure, sneezing, sore throat and trouble swallowing.   Eyes: Negative for redness and itching.  Respiratory: Negative for cough, chest tightness, shortness of breath and wheezing.   Cardiovascular: Negative for palpitations  Gastrointestinal: Negative for nausea and vomiting.  Genitourinary: Negative for dysuria.  Musculoskeletal: Negative for joint swelling.  Skin: Negative for rash.  Neurological: Negative for headaches.  Hematological: Does not bruise/bleed easily.  Psychiatric/Behavioral: Negative for dysphoric mood. The patient is not nervous/anxious.        Objective:   Physical Exam  GEN: A/Ox3; pleasant , NAD, chronically ill appearing on oxygen  Vitals signs reviewed   HEENT:  [place]/AT,  EACs-clear, TMs-wnl, NOSE-clear, THROAT-clear, no lesions, no postnasal drip or exudate noted.   NECK:  Supple w/ fair ROM; no JVD; normal carotid impulses w/o bruits; no thyromegaly or nodules palpated; no lymphadenopathy.Trach midline , dsg clean and dry .  RESP  Clear  P & A; w/o, wheezes/ rales/ or rhonchi.no accessory muscle use, no dullness to percussion  CARD:  RRR, no m/r/g  , 1+  peripheral edema, pulses intact, no cyanosis or clubbing.  GI:   Soft & nt; nml bowel sounds; no organomegaly or masses detected.  Musco: Warm bil, no deformities or joint swelling noted.   Neuro: alert, no focal deficits noted.    Skin: Warm, no lesions or rashes

## 2015-06-25 NOTE — Patient Instructions (Signed)
Continue on Trach Collar during daytime  Continue on Vent support At bedtime   Follow up Dr. Chase Caller in  2-3 months and As needed .  Please contact office for sooner follow up if symptoms do not improve or worsen or seek emergency care

## 2015-06-25 NOTE — Addendum Note (Signed)
Addended by: Osa Craver on: 06/25/2015 12:28 PM   Modules accepted: Orders

## 2015-06-25 NOTE — Assessment & Plan Note (Signed)
Continue on Trach Collar during daytime  Continue on Vent support At bedtime   Follow up Dr. Chase Caller in  2-3 months and As needed .  Please contact office for sooner follow up if symptoms do not improve or worsen or seek emergency care

## 2015-06-25 NOTE — Assessment & Plan Note (Signed)
Compensated on present regimen.   

## 2015-07-01 ENCOUNTER — Other Ambulatory Visit (HOSPITAL_COMMUNITY): Payer: Self-pay

## 2015-07-02 ENCOUNTER — Encounter (HOSPITAL_COMMUNITY)
Admission: RE | Admit: 2015-07-02 | Discharge: 2015-07-02 | Disposition: A | Discharge status: Home / Self Care | Payer: Medicare Other | Source: Ambulatory Visit | Attending: Nephrology | Admitting: Nephrology

## 2015-07-02 ENCOUNTER — Other Ambulatory Visit: Payer: Self-pay | Admitting: Adult Health

## 2015-07-02 DIAGNOSIS — N183 Chronic kidney disease, stage 3 (moderate): Secondary | ICD-10-CM | POA: Diagnosis not present

## 2015-07-02 DIAGNOSIS — J449 Chronic obstructive pulmonary disease, unspecified: Secondary | ICD-10-CM

## 2015-07-02 DIAGNOSIS — D631 Anemia in chronic kidney disease: Secondary | ICD-10-CM | POA: Diagnosis not present

## 2015-07-02 DIAGNOSIS — Z930 Tracheostomy status: Secondary | ICD-10-CM

## 2015-07-02 LAB — RENAL FUNCTION PANEL
ALBUMIN: 3 g/dL — AB (ref 3.5–5.0)
Anion gap: 11 (ref 5–15)
BUN: 64 mg/dL — AB (ref 6–20)
CO2: 40 mmol/L — ABNORMAL HIGH (ref 22–32)
CREATININE: 2.59 mg/dL — AB (ref 0.61–1.24)
Calcium: 9.1 mg/dL (ref 8.9–10.3)
Chloride: 88 mmol/L — ABNORMAL LOW (ref 101–111)
GFR calc Af Amer: 29 mL/min — ABNORMAL LOW (ref >60)
GFR, EST NON AFRICAN AMERICAN: 25 mL/min — AB (ref >60)
GLUCOSE: 234 mg/dL — AB (ref 65–99)
PHOSPHORUS: 2.8 mg/dL (ref 2.5–4.6)
Potassium: 3.9 mmol/L (ref 3.5–5.1)
SODIUM: 139 mmol/L (ref 135–145)

## 2015-07-02 LAB — IRON AND TIBC
Iron: 37 ug/dL — ABNORMAL LOW (ref 45–182)
SATURATION RATIOS: 16 % — AB (ref 17.9–39.5)
TIBC: 237 ug/dL — ABNORMAL LOW (ref 250–450)
UIBC: 200 ug/dL

## 2015-07-02 LAB — FERRITIN: FERRITIN: 446 ng/mL — AB (ref 24–336)

## 2015-07-02 MED ORDER — FERUMOXYTOL INJECTION 510 MG/17 ML
510.0000 mg | INTRAVENOUS | Status: DC
  Administered 2015-07-02: 510 mg via INTRAVENOUS
  Filled 2015-07-02: qty 17

## 2015-07-02 MED ORDER — DARBEPOETIN ALFA 200 MCG/0.4ML IJ SOSY: 200.0000 ug | PREFILLED_SYRINGE | INTRAMUSCULAR | Status: DC

## 2015-07-02 MED ORDER — DARBEPOETIN ALFA 200 MCG/0.4ML IJ SOSY
PREFILLED_SYRINGE | INTRAMUSCULAR | Status: AC
  Administered 2015-07-02: 200 ug via SUBCUTANEOUS
  Filled 2015-07-02: qty 0.4

## 2015-07-05 ENCOUNTER — Telehealth: Payer: Self-pay | Admitting: Internal Medicine

## 2015-07-05 LAB — POCT HEMOGLOBIN-HEMACUE: Hemoglobin: 7.7 g/dL — ABNORMAL LOW (ref 13.0–17.0)

## 2015-07-05 MED ORDER — CEPHALEXIN 500 MG PO CAPS: 500.0000 mg | ORAL_CAPSULE | Freq: Three times a day (TID) | ORAL | Status: DC

## 2015-07-05 NOTE — Telephone Encounter (Signed)
Called spoke with pt. C/o prod cough (Mostly clear phlem but at times mixed with yellow). Wheezing/chest tx no worse than normal, breathing unchanged. No f/c/s/n/v. Requesting ABX. Please advise MR thanks

## 2015-07-05 NOTE — Telephone Encounter (Signed)
Called pt and informed of rec from MR and medication instructions Pt voiced understanding of rec and medication instructions Pharmacy verified with pt  Order sent electronically to pt's pharmacy  Nothing further is needed

## 2015-07-05 NOTE — Telephone Encounter (Signed)
Do cephalexin  500mg  tid x 5 days (no xpak or levaquin due to long QTc)  Allergies  Allergen Reactions  . Amlodipine Besy-Benazepril Hcl Swelling    Lips swell  . Percocet [Oxycodone-Acetaminophen] Other (See Comments)    hallucinations

## 2015-07-07 DIAGNOSIS — R0602 Shortness of breath: Secondary | ICD-10-CM | POA: Diagnosis not present

## 2015-07-12 ENCOUNTER — Encounter (HOSPITAL_COMMUNITY)
Admission: RE | Admit: 2015-07-12 | Discharge: 2015-07-12 | Disposition: A | Discharge status: Home / Self Care | Payer: Medicare Other | Source: Ambulatory Visit | Attending: Nephrology | Admitting: Nephrology

## 2015-07-12 DIAGNOSIS — N183 Chronic kidney disease, stage 3 (moderate): Secondary | ICD-10-CM | POA: Diagnosis not present

## 2015-07-12 DIAGNOSIS — D631 Anemia in chronic kidney disease: Secondary | ICD-10-CM | POA: Diagnosis not present

## 2015-07-12 LAB — POCT HEMOGLOBIN-HEMACUE: HEMOGLOBIN: 8.6 g/dL — AB (ref 13.0–17.0)

## 2015-07-12 MED ORDER — FERUMOXYTOL INJECTION 510 MG/17 ML
510.0000 mg | INTRAVENOUS | Status: AC
  Administered 2015-07-12: 510 mg via INTRAVENOUS
  Filled 2015-07-12: qty 17

## 2015-07-12 MED ORDER — DARBEPOETIN ALFA 200 MCG/0.4ML IJ SOSY
200.0000 ug | PREFILLED_SYRINGE | INTRAMUSCULAR | Status: DC
  Administered 2015-07-12: 200 ug via SUBCUTANEOUS

## 2015-07-12 MED ORDER — DARBEPOETIN ALFA 200 MCG/0.4ML IJ SOSY
PREFILLED_SYRINGE | INTRAMUSCULAR | Status: AC
  Administered 2015-07-12: 200 ug via SUBCUTANEOUS
  Filled 2015-07-12: qty 0.4

## 2015-07-13 ENCOUNTER — Other Ambulatory Visit: Payer: Self-pay | Admitting: Cardiovascular Disease

## 2015-07-13 NOTE — Telephone Encounter (Signed)
REFILL 

## 2015-07-14 DIAGNOSIS — J9610 Chronic respiratory failure, unspecified whether with hypoxia or hypercapnia: Secondary | ICD-10-CM | POA: Diagnosis not present

## 2015-07-19 DIAGNOSIS — G4733 Obstructive sleep apnea (adult) (pediatric): Secondary | ICD-10-CM | POA: Diagnosis not present

## 2015-07-19 DIAGNOSIS — E662 Morbid (severe) obesity with alveolar hypoventilation: Secondary | ICD-10-CM | POA: Diagnosis not present

## 2015-07-19 DIAGNOSIS — J449 Chronic obstructive pulmonary disease, unspecified: Secondary | ICD-10-CM | POA: Diagnosis not present

## 2015-07-19 DIAGNOSIS — J9690 Respiratory failure, unspecified, unspecified whether with hypoxia or hypercapnia: Secondary | ICD-10-CM | POA: Diagnosis not present

## 2015-07-23 ENCOUNTER — Other Ambulatory Visit (HOSPITAL_COMMUNITY): Payer: Self-pay | Admitting: *Deleted

## 2015-07-23 DIAGNOSIS — G4733 Obstructive sleep apnea (adult) (pediatric): Secondary | ICD-10-CM | POA: Diagnosis not present

## 2015-07-23 DIAGNOSIS — J449 Chronic obstructive pulmonary disease, unspecified: Secondary | ICD-10-CM | POA: Diagnosis not present

## 2015-07-23 DIAGNOSIS — E662 Morbid (severe) obesity with alveolar hypoventilation: Secondary | ICD-10-CM | POA: Diagnosis not present

## 2015-07-23 DIAGNOSIS — Z930 Tracheostomy status: Secondary | ICD-10-CM | POA: Diagnosis not present

## 2015-07-26 ENCOUNTER — Inpatient Hospital Stay (HOSPITAL_COMMUNITY): Admission: RE | Admit: 2015-07-26 | Payer: Medicare Other | Source: Ambulatory Visit

## 2015-07-29 ENCOUNTER — Encounter (HOSPITAL_COMMUNITY)
Admission: RE | Admit: 2015-07-29 | Discharge: 2015-07-29 | Disposition: A | Discharge status: Home / Self Care | Payer: Medicare Other | Source: Ambulatory Visit | Attending: Nephrology | Admitting: Nephrology

## 2015-07-29 DIAGNOSIS — N183 Chronic kidney disease, stage 3 (moderate): Secondary | ICD-10-CM | POA: Insufficient documentation

## 2015-07-29 DIAGNOSIS — D631 Anemia in chronic kidney disease: Secondary | ICD-10-CM | POA: Insufficient documentation

## 2015-07-29 LAB — RENAL FUNCTION PANEL
ANION GAP: 10 (ref 5–15)
Albumin: 3.5 g/dL (ref 3.5–5.0)
BUN: 83 mg/dL — ABNORMAL HIGH (ref 6–20)
CALCIUM: 8.9 mg/dL (ref 8.9–10.3)
CHLORIDE: 92 mmol/L — AB (ref 101–111)
CO2: 39 mmol/L — AB (ref 22–32)
CREATININE: 2.42 mg/dL — AB (ref 0.61–1.24)
GFR calc non Af Amer: 27 mL/min — ABNORMAL LOW (ref >60)
GFR, EST AFRICAN AMERICAN: 31 mL/min — AB (ref >60)
Glucose, Bld: 104 mg/dL — ABNORMAL HIGH (ref 65–99)
PHOSPHORUS: 4.4 mg/dL (ref 2.5–4.6)
POTASSIUM: 4 mmol/L (ref 3.5–5.1)
Sodium: 141 mmol/L (ref 135–145)

## 2015-07-29 LAB — POCT HEMOGLOBIN-HEMACUE: HEMOGLOBIN: 9.5 g/dL — AB (ref 13.0–17.0)

## 2015-07-29 MED ORDER — DARBEPOETIN ALFA 200 MCG/0.4ML IJ SOSY
PREFILLED_SYRINGE | INTRAMUSCULAR | Status: AC
  Filled 2015-07-29: qty 0.4

## 2015-07-29 MED ORDER — DARBEPOETIN ALFA 200 MCG/0.4ML IJ SOSY
200.0000 ug | PREFILLED_SYRINGE | INTRAMUSCULAR | Status: DC
  Administered 2015-07-29: 200 ug via SUBCUTANEOUS

## 2015-08-04 DIAGNOSIS — G4733 Obstructive sleep apnea (adult) (pediatric): Secondary | ICD-10-CM | POA: Diagnosis not present

## 2015-08-04 DIAGNOSIS — J9612 Chronic respiratory failure with hypercapnia: Secondary | ICD-10-CM | POA: Diagnosis not present

## 2015-08-04 DIAGNOSIS — Z930 Tracheostomy status: Secondary | ICD-10-CM | POA: Diagnosis not present

## 2015-08-04 DIAGNOSIS — Z430 Encounter for attention to tracheostomy: Secondary | ICD-10-CM | POA: Diagnosis not present

## 2015-08-06 DIAGNOSIS — R0602 Shortness of breath: Secondary | ICD-10-CM | POA: Diagnosis not present

## 2015-08-11 ENCOUNTER — Encounter (HOSPITAL_COMMUNITY)
Admission: RE | Admit: 2015-08-11 | Discharge: 2015-08-11 | Disposition: A | Discharge status: Home / Self Care | Payer: Medicare Other | Source: Ambulatory Visit | Attending: Nephrology | Admitting: Nephrology

## 2015-08-11 DIAGNOSIS — N183 Chronic kidney disease, stage 3 (moderate): Secondary | ICD-10-CM | POA: Diagnosis not present

## 2015-08-11 DIAGNOSIS — D631 Anemia in chronic kidney disease: Secondary | ICD-10-CM | POA: Diagnosis not present

## 2015-08-11 LAB — POCT HEMOGLOBIN-HEMACUE: HEMOGLOBIN: 9.6 g/dL — AB (ref 13.0–17.0)

## 2015-08-11 MED ORDER — DARBEPOETIN ALFA 200 MCG/0.4ML IJ SOSY
200.0000 ug | PREFILLED_SYRINGE | INTRAMUSCULAR | Status: DC
  Administered 2015-08-11: 200 ug via SUBCUTANEOUS

## 2015-08-11 MED ORDER — DARBEPOETIN ALFA 200 MCG/0.4ML IJ SOSY
PREFILLED_SYRINGE | INTRAMUSCULAR | Status: AC
  Filled 2015-08-11: qty 0.4

## 2015-08-13 ENCOUNTER — Other Ambulatory Visit: Payer: Self-pay | Admitting: Cardiovascular Disease

## 2015-08-13 DIAGNOSIS — J9610 Chronic respiratory failure, unspecified whether with hypoxia or hypercapnia: Secondary | ICD-10-CM | POA: Diagnosis not present

## 2015-08-13 NOTE — Telephone Encounter (Signed)
Rx request sent to pharmacy.  

## 2015-08-18 ENCOUNTER — Encounter: Payer: Self-pay | Admitting: Internal Medicine

## 2015-08-18 ENCOUNTER — Ambulatory Visit (INDEPENDENT_AMBULATORY_CARE_PROVIDER_SITE_OTHER): Payer: Medicare Other | Admitting: Internal Medicine

## 2015-08-18 VITALS — BP 128/78 | HR 97 | Ht 73.0 in | Wt 276.0 lb

## 2015-08-18 DIAGNOSIS — J9612 Chronic respiratory failure with hypercapnia: Secondary | ICD-10-CM | POA: Diagnosis not present

## 2015-08-18 DIAGNOSIS — J449 Chronic obstructive pulmonary disease, unspecified: Secondary | ICD-10-CM

## 2015-08-18 MED ORDER — PREDNISONE 10 MG PO TABS
ORAL_TABLET | ORAL | Status: DC
Start: 2015-08-18 — End: 2015-11-17

## 2015-08-18 MED ORDER — ARFORMOTEROL TARTRATE 15 MCG/2ML IN NEBU: 15.0000 ug | INHALATION_SOLUTION | Freq: Two times a day (BID) | RESPIRATORY_TRACT | Status: DC

## 2015-08-18 NOTE — Progress Notes (Signed)
Subjective:     Patient ID: [name] [name], male   DOB: [birth year], 65 y.o.   MRN: [id number]  HPI   01/23/11- COPD, CAD/diastolic dysfunction, OSA, chronic respiratory failure, obesity Hypoventilation, cor pulmonale Hosp since last here- once for "shakes"-they told him wasn't using CPAP right, once for epistaxis, once to place urinary stent, and has had a right ? renal biopsy. Told anemic.  Breathing stable, but can't lie flat- feels smothered even on O2.  Runs O2 2-3L/M usually. I gave permision to go to 4 during exertion. Notes "tiresome" feeling midchest, related to exertion/ climbing stairs and relieved by rest. Told to pace himself and not try to push through that.  Discussed need for O2. He doesn't sleep well with his BiPAP 15/12. Discussed sleep hygiene- discussed room temperature. He was more comfortable with autotitration in hosp and we discussed a trial of that at home.  Has restarted diuretic since leg edema has come back.   05/25/11-  COPD, CAD/diastolic dysfunction, OSA, chronic respiratory failure, obesity Hypoventilation, cor pulmonale Went to ER last week- short of breath. Air wasn't satisfying. Was started back on shot for anemia. Feels some better.  Reports sneezing, postnasal drainage and a sense of mucus in his upper chest. Denies fever, sore throat and doesn't think he has a cold.  06/28/11-  COPD, CAD/diastolic dysfunction, OSA, chronic respiratory failure, obesity Hypoventilation, cor pulmonale Recently hospitalized October 25-29, notes reviewed with him and x-ray images reviewed by me. He was hospitalized for hypercapnic respiratory failure with transient encephalopathy and renal insufficiency. Since discharge he has regained some ankle edema while off of furosemide. He has a nephrology appointment later this month. He is concerned that he was taken off his diabetes medicines and is directed to discuss this with his primary physician immediately. He has some persistent  soreness across his anterior chest wall at the level of the xiphoid but otherwise breathing better with less exertional dyspnea and before he went in the hospital. He remains dependent on continuous oxygen at 2 L. He continues his BiPAP machine all night, every night but says he was sleeping better with it  on autotitration with a lower pressure used in the hospital.  09/25/11- COPD, CAD/diastolic dysfunction, OSA, chronic respiratory failure, obesity Hypoventilation, cor pulmonale Since last here he was hospitalized for respiratory failure with CHF and obesity hypoventilation. Better with diuresis. BiPAP 13/10 is now comfortable and used all night every night with supplemental oxygen 4 L. Tolerated colonoscopy without respiratory distress. Uses a rescue inhaler only occasionally but says it does help then.  11/23/11- COPD, CAD/diastolic dysfunction, OSA, chronic respiratory failure, obesity Hypoventilation, cor pulmonale She is noticing some increased shortness of breath on exertion such as getting in the car but no increase in ankle edema which is well controlled. Occasional cough is not progressive or productive. Noticing more rhinorrhea with the pollen. Continues BiPAP  I 13/E 10 all night every night.  02/07/12- COPD, CAD/diastolic dysfunction, OSA, chronic respiratory failure, obesity Hypoventilation, cor pulmonale  pt states doing some better.wakes up out of a nap coughing feels like he's choking.  Post Hosp- 01/14/12- 01/23/12-discharge diagnoses reviewed with him: Coronary artery disease/CABG, diastolic dysfunction, COPD with chronic respiratory failure, acute respiratory failure with hypoxia, acute on chronic renal failure, DM. He was intubated for 4 days and treated with Zosyn, vancomycin and Avelox, Levaquin, Zithromax and Rocephin. Cultures negative. He was to wean off of prednisone over 3 days. He was hoarse after intubation but that has resolved. He  is using oxygen 1-2 L and BiPAP  13/10 at  night. He feels he is pretty close to his baseline.  03/28/12-  COPD, CAD/diastolic dysfunction/ chronic CHF, OSA, chronic respiratory failure, obesity Hypoventilation, cor pulmonale  6 MWT today. Pt states breathing has been "okay" since last OV. Pt states having trouble falling alseep-not sleeping well on BiPAP. Pt states mask fit well but thinks the pressure needs to be adjusted again. Currently 13/10+ oxygen at 2 L/ Advanced. He sleeps better in a recliner with oxygen but without his BiPAP. Getting iron injections for anemia. We discussed shortness of breath and anemia. Pain left anterior axillary line described as a sore ache over the past week. He is able to raise his arm. Thinks he maybe lifted something wrong. COPD assessment test (CAT) 21/40 CXR 01/14/12- 1 view- IMPRESSION:  Stable support apparatus. Residual mild interstitial prominence  with slight improvement in aeration. No convincing pulmonary  edema. Probable small left pleural effusion with left basilar  atelectasis or infiltrate.  Original Report Authenticated By: [name], M.D.   02/[birth year]/49-KXFG, CAD/diastolic dysfunction/ chronic CHF, OSA, chronic respiratory failure, obesity Hypoventilation, cor pulmonale Hospitalized again between March 9 and 12 for acute on chronic renal failure with congestive heart failure, chronic respiratory failure on BiPAP and acute respiratory failure with hypoxia. He says he is feeling better at this time. Continues oxygen 3 L/Advanced. He has been using BiPAP 13/10 but thinks he slept better with CPAP. We discussed options. Usually when he decompensates it is from fluid overload triggering CO2 retention and hypoventilation.  CXR 10/20/12 IMPRESSION:  Vascular congestion and cardiomegaly, with small bilateral pleural  effusions and bibasilar airspace opacification. Findings are most  compatible with recurrent pulmonary edema, though underlying  pneumonia cannot be excluded.  Original Report  Authenticated By: [name], M.D.  12/02/2012 [facility] follow up  Returns for a post hospital follow up .  Reports doing well overall but still having some SOB, thinks this may be due to his pressure not being high enough on BIPAP. Since decreasing BIPAP 1 month ago  down to 12/10 he does not feel as good. Trouble sleeping.  Was admitted for decompensated cor pulmonale w/ fluid overload with subsequent acute on chronic hypercarbic resp failure.  He admits he can not afford his advair and spiriva- therefore he is not taking.  He does not wear BIPAP everynight. We discussed the implications of this and untreated OHS/OSA. He naps a lot but does not wear BIPAP.  He was treated with diuresis with improvement.  Discharged on Lasix 40mg  . Lower dose due to bump in scr. Has OP follow up with Renal d/t renal insufficiency.   02/05/13- COPD, CAD/diastolic dysfunction/ chronic CHF, OSA, chronic respiratory failure, obesity Hypoventilation, cor pulmonale Hosp 6/16-6/19/14 Acute on Chronic Resp Failure Discharge Plan:  -pt educated on BiPAP compliance at home  -continue pulmicort, scheduled albuterol / atrovent (med cost has been an issue)  -wean prednisone to off  -given information for [employer] at [employer]. Pulmonary Rehab was a cost issue (45$ per visit)  -RN home health to review medications and reinforce need for BiPAP 12/12 wO2 2-3L for sleep, O2 3-4 awake.  FOLLOWS FOR: Breathing has improved since leaving the hospital. Reports DOE, chest tightness with exertion and slight coughing from time to time. Wife needs FMLA form. CXR 01/29/13 1View IMPRESSION:  Slightly improved aeration in the lungs may represent decreasing  edema.  Original Report Authenticated By: Markus Daft, M.  04/11/13- COPD,  CAD/diastolic dysfunction/ chronic CHF, OSA, chronic respiratory failure, obesity Hypoventilation, cor pulmonale FOLLOWS FOR: reports breathing is essentially unchanged but he feels like he's  "burning through more oxygen than I used to." denies wheezing, increased SOB, tightness, increased cough, f/c/s Just in hospital again acute on chronic respiratory failure with CHF and COPD. Today he feels well except for frontal headache. Continuing oxygen at 2.5 L. BIPAP Auto 5-15/ O2 2L/ Advanced CXR 1V 8/14 IMPRESSION:  1. Extubated, enteric tube removed. Stable lung volumes.  2. Stable ventilation. Small pleural effusions.  Electronically Signed  By: Lars Pinks  On: 04/02/2013 07:43  06/23/2013 [facility] follow up  Patient presents for a post hospital followup. Patient was admitted October 7 through October 30 for acute on chronic respiratory failure. Patient did require intubation for airway protection in the emergency room due to acute encephalopathy. Unfortunately, patient was unable to be weaned from the ventilator and underwent a tracheostomy. Patient was weaned to trach collar during the daytime and vent support. At bedtime. Lurline Idol was placed by ENT Dr. Redmond Baseman. He was transitioned to an extra long #6 trach prior to discharge.  Patient returns today accompanied by his wife. Patient reports that he has been doing okay. He strength. He is tolerating vent support at night. However, he does feel that this is uncomfortable in certain times. Vent settings at night are PRVC at a rate of 14 with a tidal volume of 450 cc with FiO2 of 40%, and a PEEP of 5. Patient has been using Passy-Muir valve for speech however, over the last couple days, feels it's been more difficulties than his usual. He does have follow with ENT later this week for trach evaluation. Patient denies any hemoptysis, trach site. Bleeding, Orthopnea, PND, or increased leg swelling. Has been eating okay without any noted , dysphagia. Appetite is decreased with no nausea, vomiting, diarrhea.  REC Continue on current regimen.  follow up with ENT this week for [facility] evaluation.  I will call with labs and xray results.   Continue on Trach Collar during daytime  Continue on Vent support At bedtime   Follow up Dr. Chase Caller in 6-8 weeks and As needed .  Please contact office for sooner follow up if symptoms do not improve or worsen or seek emergency care   Late Add : ? Right sided infiltrate  Add Levaquin 500mg  daily x 7 days #7 , no refills.    OV 07/21/2013 Followup chronic respiratory failure status post tracheostomy. Since seeing my nurse practitioner a month ago he is doing well. Uses ventilator at night and daytime is on tracheostomy collar. He is not compliant with his Passy-Muir valve because it gives him a sensation of suffocation. But he is able to talk with open tracheostomy. He still reports chronic stable dyspnea on exertion for walking a block or sometimes half a block only. This is relieved by rest. Rated as moderate. There is no associated chest pain no worsening cough or phlegm. He did have some hemoptysis yesterday and he is trying to get in contact with the ENT specialist. As noted just beside bleeding. He denies any orthopnea or paroxysmal nocturnal dyspnea. Eating okay.  Lab review I do note that is not had a CT scan of the chest since 2012 rec Glad you are doing well CMA will refer you to pumonary rehab on basis of chronic resp failure Please do CT scan chest for pleural fibrosis, pleural plaques Followup with ENT> Dr Redmond Baseman, changed trach on 12/15  07/31/2013 f/u ov/Wert re: worse since 07/24/13 (day of CT Chest - see below) Chief Complaint  Patient presents with  . Acute Visit    Pt c/o increased SOB for the past several days. He states gets out of breath with any sort of exertion.  He has minimal cough that is non prod.   mucus is clear/ not bloody Baseline use of alb not much at all,  One every few days at most Assoc with increased fluid in legs  But followed by Gwenlyn Found who adjusts his lasix  No obvious patterns in day to day or daytime variabilty or assoc chronic cough or cp or  chest tightness, subjective wheeze overt sinus or hb symptoms. No unusual exp hx or h/o childhood pna/ asthma or knowledge of premature birth.  Sleeping ok without nocturnal  or early am exacerbation  of respiratory  c/o's or need for noct saba. Also denies any obvious fluctuation of symptoms with weather or environmental changes or other aggravating or alleviating factors except as outlined above    09/15/2013 Follow up  Returns for a 2 month follow up for COPD, chronic resp failure -trach dependent.  Reports breathing has worsened x2 weeks w/ increased SOB, chest tightness, cough with white mucus. Coughing and congestion make it difficult to use vent at night . Denies any f/c/s, nausea, vomiting, hemoptysis, edema.  Was referred to pulmonary rehab however could not afford co-pay amounts. We discussed looking into pt assistance.    OV 10/28/2013  Chief Complaint  Patient presents with  . Follow-up    Pt is c/o rattling in his chest, and dry cough. Pt also c/o increased swelling in both ankles.     Followup chronic respiratory failure status post tracheostomy by ENT. Chronic respiratory failure on account of COPD, OSA, diastolic heart failure  - Overall he is stable. He reports using the ventilator at night but occasionally he is noncompliant. When he does not use the ventilator the next day he is more fatigued according to his wife. In the daytime he is on trach collar on room air of liters of oxygen. He occasionally uses his Passy-Muir valve for talk but otherwise feels a sense of suffocation. The last few weeks he's noticed some increased pedal edema and despite increasing and self titrating his Lasix. In association with this he is more rattling in his chest and more shortness of breath and cough but there is no change in sputum color. The sputum is not pink and frothy. He has not seen his cardiologist Dr.? Dr Adora Fridge a while and he plans to see him more expeditiously. Denies any chest pain  cough fever chills or wheezing.  Copd appears stable However, diastolic chf might be worse My office will help you seen by Dr Gwenlyn Found office next day or two  REturn to see me in 4 months or sooner if needed  - continue trach care through ENT clinic   OV 02/24/2014  Chief Complaint  Patient presents with  . Follow-up    Pt c/o increase SOB, cough with clear mucous, intermittent edema in BIL ankles. C/o upper mid sternal CP and left upper CP when on ventilator.    Followup chronic respiratory failure on account of obesity, sleep apnea, COPD and diastolic heart failure  - He continues to feel poorly. For the last month or so he produces increased shortness of breath associated with worsening pedal edema. Is only on Lasix 40 mg once daily. He says he is compliant with  his nebulizers. Although, he is noncompliant with his ventilator stating that the pressures are too high. He probably uses it less than 50% of the time. In addition he is very keen on getting his tracheostomy removed permanently this is despite our advice that he keep it in permanently. He is frustrated by his quality of life. I've explained to him that weight loss is paramount but he is not too keen on this. He reports that his cough, sputum or in sputum color or roughly the same as baseline without any fever >>pred taper , increased lasix 40mg  Twice daily    03/10/14 Follow up COPD/OHS/ Trach Depend RF /Noct Vent Returns for follow up for COPD . Seen last ov with COPD flare and suspected decompensated Diastolic CHF. He was treated with prednisone taper and diuretics were increased.  He says he is some better but gets winded easily.  Encouraged to use vent at night .  Leg swelling slightly better. Labs last week showed scr stable.  No chest pain , hemoptyiss, fever or n/vd/     O 05/12/2014  Chief Complaint  Patient presents with  . Follow-up    Pt states his breathing has slightly worsened since last OV. Pt states he has a  slight increase in SOB, non prod cough and chest pain when coming off the vent in the morning.    Followup chronic respiratory failure on account of obesity, sleep apnea, COPD and diastolic heart failure and Trach Depend RF /Noct Vent   - presents with wife. Continues to report frustration having trach in life; more resigned to having it. He is frustrated by class 3 exertional fatigue and dyspnea. REcently hgb 6.3gm% at renal; no obvious bleed and renal managing it but dyspnea is chronic. Noc hange in baseline cough and white sputum of moderate intensity. Continues vent at night; more compliant.  Uptodate with flu shot. He has cards appt pending in  <  2 months per hx   12/10/2014 [facility] follow up  Patient presents for a post hospital follow-up Patient was admitted April 5 through April 7 for pneumonia and acute on chronic hypercarbic respiratory failure Influenza panel was negative. He was treated with IV antibiotics, steroids, and nebulized bronchodilators. CT chest showed a right middle lobe pneumonia. Patient remains on trach collar 4.5L  during the daytime with vent support at night. He says since discharge he is feeling some better. He still continues to feel weak. He denies any hemoptysis, orthopnea, PND or leg swelling Seen by ENT with trach change  Last week.  Remains on Pulmicort and brovana nebs.  Wt is down from discharge (~6 lbs ) , decreased leg swelling.  Does tell me he turns up O2 to 6-7 L when he feels sob even though O2 sats >90%.  . We discussed O2 sats gaol of 90-92%.    01/22/2015 Follow up: COPD /trach dependent , noct vent  Returns for 2 week follow up.  Patient was given Cipro last visit for persistent cough and congestion. Last ov lasix increased for fluid overload.  Reports breathing is improved since last ov, cough is now clear, still having some occasional tightness when coming off the vent. Overall feels better.   CXR shows slight improvement in  aeration today  Wt is down .Leg swelling is less.   Going to ENT to discuss trach options-would prefer to not have trach.   He denies any chest pain, orthopnea, PND or increased  leg swelling  OV 03/10/2015  Chief Complaint  Patient presents with  . Follow-up    Still having SOB and coughing up mostly clear thick mucus with occ green mucus.     Followup chronic respiratory failure on account of obesity, sleep apnea, COPD and diastolic heart failure   Is improved from his recent exacerbation with H. influenzae.. Currently he is here with his wife. Both at test that he is doing well. He uses nocturnal ventilation. Daytime tracheostomy with Passy-Muir valve. He is very adamant that he wants to get his tracheostomy removed no matter what the consequences are. Currently feeling fine. Baseline chronic venous stasis edema present. There are no acute complaints. >>discussed with ENT to set up for trial of trach capping   .04/14/2015 Follow up : COPD /trach dependent , noct vent , OSA/OHS, Diastolic CHF  Pt returns for 1 month follow up  Complains of increased cough and congestion Pt c/o SOB and wheezing at times, chest congestion with a prod cough with light green/yellow mucus.  Denies any chest tightness, fever, or nausea and vomitting. Pt has not made appt with ENT for trach capping.  Pt was suppose to see ENT to be set up for a trial of Trach capping for 4 weeks . Then to decide if able to decanulate.  Would like [place] to call over and get this set up .  Remains on Brovana and Pulmicort .  On Oxygen 4l/m in daytime with trach collar and Vent at At bedtime  .  No chest pain, orthopnea, edema or fever   Recently moved. Starting to get settled in.    06/25/2015 [facility] follow up : COPD /trach dependent , noct vent , OSA/OHS, Diastolic CHF  Patient presents for a post hospital follow-up Patient was admitted 18th to October 21 for acute on chronic hypercarbic respiratory failure. He  was admitted to evaluate for possible decannulation.  Trials of trach capping with nocturnal BiPAP were tried, however, patient has significant worsening respiratory acidosis with PCO2's greater than 100, with associated decreased level of consciousness. Long discussion with patient and wife that this is not a good idea and he should continue with tracheostomy. Since discharge he is doing okay. Remains on trach collar daytime and vent At bedtime   Cough , congestion are at baseline.  Gets winded easily but at his baseline He is sad about trach, support provided.  He denies chest pain, orthopnea , fever or hemoptysis   OV 08/18/2015  Chief Complaint  Patient presents with  . Follow-up    Pt c/o chest congestion with difficulty bring the mucus up and c/o increase in SOB from baseline. Pt denies issues on the vent at night.     Follow-up severe COPD tracheostomy dependent nocturnal ventilator OSA/OHS/diastolic heart failure. Failed attempted decannulation October 2016  Presents for routine follow-up with his wife as always. He says that he still has hope that one day he can come off the ventilator and tracheostomy. He continues to be obese although sees says he has lost weight. His current BMI is 36. Overall he is stable. Mucus is now clear. Although after telling me stable he suddenly said that he is having some mild worsening dyspnea for the past week. In addition when he eats some food he feels a noise in his throat. He then has to clear his food. He denies this is dysphagia. He absolutely is certain this is not dysphagia. He is asking for treatment especially for prednisone or antibiotics although there  is no fever or change in sputum color or volume. He does not want to go through a GI evaluation or speech evaluation. He says in October 2016 he was advised to chin tuck and eat and he says current problem is not dysphagia.   Review of Systems Per hpi    Objective:   Physical Exam   Constitutional: He is oriented to person, place, and time. He appears well-developed and well-nourished. No distress.  HENT:  Head: Normocephalic and atraumatic.  Right Ear: External ear normal.  Left Ear: External ear normal.  Mouth/Throat: Oropharynx is clear and moist. No oropharyngeal exudate.  Tracheostomy present  Eyes: Conjunctivae and EOM are normal. Pupils are equal, round, and reactive to light. Right eye exhibits no discharge. Left eye exhibits no discharge. No scleral icterus.  Neck: Normal range of motion. Neck supple. No JVD present. No tracheal deviation present. No thyromegaly present.  Cardiovascular: Normal rate, regular rhythm and intact distal pulses.  Exam reveals no gallop and no friction rub.   No murmur heard. Pulmonary/Chest: Effort normal and breath sounds normal. No respiratory distress. He has no wheezes. He has no rales. He exhibits no tenderness.  Baseline diminished air entry  Abdominal: Soft. Bowel sounds are normal. He exhibits no distension and no mass. There is no tenderness. There is no rebound and no guarding.  Musculoskeletal: Normal range of motion. He exhibits no edema or tenderness.  Lymphadenopathy:    He has no cervical adenopathy.  Neurological: He is alert and oriented to person, place, and time. He has normal reflexes. No cranial nerve deficit. Coordination normal.  Skin: Skin is warm and dry. No rash noted. He is not diaphoretic. No erythema. No pallor.  Psychiatric: He has a normal mood and affect. His behavior is normal. Judgment and thought content normal.  He feels frustrated  Nursing note and vitals reviewed.  Filed Vitals:   08/18/15 1450  BP: 128/78  Pulse: 97  Height: 6\' 1"  (1.854 m)  Weight: 276 lb (125.193 kg)  SpO2: 99%     Estimated body mass index is 36.42 kg/(m^2) as calculated from the following:   Height as of this encounter: 6\' 1"  (1.854 m).   Weight as of this encounter: 276 lb (125.193 kg).     Assessment:        ICD-9-CM ICD-10-CM   1. Chronic obstructive pulmonary disease, unspecified COPD type ([facility]) 496 J44.9   2. Hypercapnic respiratory failure, chronic ([facility]) 518.83 J96.12         Plan:      Continue o2 day time with trach collar and night vent Nebs as before Meds including mucinex as before Can try steam inhalation for mucus Unclear what your throat complaint is -   - but try Please take prednisone 40 mg x1 day, then 30 mg x1 day, then 20 mg x1 day, then 10 mg x1 day, and then 5 mg x1 day and stop Will discuss decannulation and body mass index normalizes  Followup  - 3 months or sooner if needed - if throast issues persist, call us and we can refer you to GI/speech    Dr. Brand Males, M.D., [facility].C.P Pulmonary and Critical Care [facility] Staff Physician [facility] Pager: [phone], If no answer or between  15:00h - 7:00h: call [phone]  08/18/2015 3:13 PM

## 2015-08-18 NOTE — Patient Instructions (Addendum)
ICD-9-CM ICD-10-CM   1. Chronic obstructive pulmonary disease, unspecified COPD type ([facility]) 496 J44.9   2. Hypercapnic respiratory failure, chronic ([facility]) 518.83 J96.12    Continue o2 day time with trach collar and night vent Nebs as before Meds including mucinex as before Can try steam inhalation for mucus Unclear what your throat complaint is -   - but try Please take prednisone 40 mg x1 day, then 30 mg x1 day, then 20 mg x1 day, then 10 mg x1 day, and then 5 mg x1 day and stop Will discuss decannulation when body mass index normalizes  Followup  - 3 months or sooner if needed - if throast issues persist, call us and we can refer you to GI/speech

## 2015-08-25 ENCOUNTER — Encounter (HOSPITAL_COMMUNITY)
Admission: RE | Admit: 2015-08-25 | Discharge: 2015-08-25 | Disposition: A | Discharge status: Home / Self Care | Payer: Medicare Other | Source: Ambulatory Visit | Attending: Nephrology | Admitting: Nephrology

## 2015-08-25 DIAGNOSIS — N183 Chronic kidney disease, stage 3 (moderate): Secondary | ICD-10-CM | POA: Diagnosis not present

## 2015-08-25 DIAGNOSIS — D631 Anemia in chronic kidney disease: Secondary | ICD-10-CM | POA: Insufficient documentation

## 2015-08-25 LAB — IRON AND TIBC
Iron: 44 ug/dL — ABNORMAL LOW (ref 45–182)
Saturation Ratios: 19 % (ref 17.9–39.5)
TIBC: 231 ug/dL — ABNORMAL LOW (ref 250–450)
UIBC: 187 ug/dL

## 2015-08-25 LAB — POCT HEMOGLOBIN-HEMACUE: Hemoglobin: 10.3 g/dL — ABNORMAL LOW (ref 13.0–17.0)

## 2015-08-25 LAB — RENAL FUNCTION PANEL
ALBUMIN: 3.5 g/dL (ref 3.5–5.0)
Anion gap: 10 (ref 5–15)
BUN: 69 mg/dL — AB (ref 6–20)
CO2: 43 mmol/L — ABNORMAL HIGH (ref 22–32)
Calcium: 9.1 mg/dL (ref 8.9–10.3)
Chloride: 85 mmol/L — ABNORMAL LOW (ref 101–111)
Creatinine, Ser: 1.86 mg/dL — ABNORMAL HIGH (ref 0.61–1.24)
GFR calc Af Amer: 43 mL/min — ABNORMAL LOW (ref >60)
GFR calc non Af Amer: 37 mL/min — ABNORMAL LOW (ref >60)
GLUCOSE: 132 mg/dL — AB (ref 65–99)
Phosphorus: 3.5 mg/dL (ref 2.5–4.6)
Potassium: 3.8 mmol/L (ref 3.5–5.1)
SODIUM: 138 mmol/L (ref 135–145)

## 2015-08-25 LAB — FERRITIN: FERRITIN: 477 ng/mL — AB (ref 24–336)

## 2015-08-25 MED ORDER — DARBEPOETIN ALFA 200 MCG/0.4ML IJ SOSY
PREFILLED_SYRINGE | INTRAMUSCULAR | Status: AC
  Filled 2015-08-25: qty 0.4

## 2015-08-25 MED ORDER — DARBEPOETIN ALFA 200 MCG/0.4ML IJ SOSY
200.0000 ug | PREFILLED_SYRINGE | INTRAMUSCULAR | Status: DC
  Administered 2015-08-25: 200 ug via SUBCUTANEOUS

## 2015-09-08 ENCOUNTER — Encounter (HOSPITAL_COMMUNITY)
Admission: RE | Admit: 2015-09-08 | Discharge: 2015-09-08 | Disposition: A | Discharge status: Home / Self Care | Payer: Medicare Other | Source: Ambulatory Visit | Attending: Nephrology | Admitting: Nephrology

## 2015-09-08 DIAGNOSIS — D631 Anemia in chronic kidney disease: Secondary | ICD-10-CM | POA: Diagnosis not present

## 2015-09-08 LAB — POCT HEMOGLOBIN-HEMACUE: Hemoglobin: 10.5 g/dL — ABNORMAL LOW (ref 13.0–17.0)

## 2015-09-08 MED ORDER — DARBEPOETIN ALFA 200 MCG/0.4ML IJ SOSY
200.0000 ug | PREFILLED_SYRINGE | INTRAMUSCULAR | Status: DC
  Administered 2015-09-08: 200 ug via SUBCUTANEOUS

## 2015-09-08 MED ORDER — DARBEPOETIN ALFA 200 MCG/0.4ML IJ SOSY
PREFILLED_SYRINGE | INTRAMUSCULAR | Status: AC
  Filled 2015-09-08: qty 0.4

## 2015-09-10 ENCOUNTER — Ambulatory Visit: Payer: Medicare Other | Admitting: Internal Medicine

## 2015-09-13 ENCOUNTER — Other Ambulatory Visit: Payer: Self-pay | Admitting: Cardiovascular Disease

## 2015-09-13 NOTE — Telephone Encounter (Signed)
Rx request sent to pharmacy.  

## 2015-09-18 ENCOUNTER — Other Ambulatory Visit: Payer: Self-pay | Admitting: Internal Medicine

## 2015-09-23 ENCOUNTER — Encounter (HOSPITAL_COMMUNITY)
Admission: RE | Admit: 2015-09-23 | Discharge: 2015-09-23 | Disposition: A | Discharge status: Home / Self Care | Payer: Medicare Other | Source: Ambulatory Visit | Attending: Nephrology | Admitting: Nephrology

## 2015-09-23 DIAGNOSIS — D631 Anemia in chronic kidney disease: Secondary | ICD-10-CM | POA: Diagnosis present

## 2015-09-23 DIAGNOSIS — N183 Chronic kidney disease, stage 3 (moderate): Secondary | ICD-10-CM | POA: Diagnosis not present

## 2015-09-23 LAB — POCT HEMOGLOBIN-HEMACUE: HEMOGLOBIN: 9.9 g/dL — AB (ref 13.0–17.0)

## 2015-09-23 MED ORDER — DARBEPOETIN ALFA 200 MCG/0.4ML IJ SOSY
200.0000 ug | PREFILLED_SYRINGE | INTRAMUSCULAR | Status: DC
  Administered 2015-09-23: 200 ug via SUBCUTANEOUS

## 2015-09-23 MED ORDER — DARBEPOETIN ALFA 200 MCG/0.4ML IJ SOSY
PREFILLED_SYRINGE | INTRAMUSCULAR | Status: AC
  Filled 2015-09-23: qty 0.4

## 2015-10-07 ENCOUNTER — Inpatient Hospital Stay (HOSPITAL_COMMUNITY): Admission: RE | Admit: 2015-10-07 | Payer: Medicare Other | Source: Ambulatory Visit

## 2015-10-08 ENCOUNTER — Encounter (HOSPITAL_COMMUNITY)
Admission: RE | Admit: 2015-10-08 | Discharge: 2015-10-08 | Disposition: A | Discharge status: Home / Self Care | Payer: Medicare Other | Source: Ambulatory Visit | Attending: Nephrology | Admitting: Nephrology

## 2015-10-08 DIAGNOSIS — D631 Anemia in chronic kidney disease: Secondary | ICD-10-CM | POA: Diagnosis not present

## 2015-10-08 LAB — POCT HEMOGLOBIN-HEMACUE: Hemoglobin: 9.9 g/dL — ABNORMAL LOW (ref 13.0–17.0)

## 2015-10-08 MED ORDER — DARBEPOETIN ALFA 200 MCG/0.4ML IJ SOSY
PREFILLED_SYRINGE | INTRAMUSCULAR | Status: AC
  Filled 2015-10-08: qty 0.4

## 2015-10-08 MED ORDER — DARBEPOETIN ALFA 200 MCG/0.4ML IJ SOSY
200.0000 ug | PREFILLED_SYRINGE | INTRAMUSCULAR | Status: DC
  Administered 2015-10-08: 200 ug via SUBCUTANEOUS

## 2015-10-22 ENCOUNTER — Encounter (HOSPITAL_COMMUNITY)
Admission: RE | Admit: 2015-10-22 | Discharge: 2015-10-22 | Disposition: A | Discharge status: Home / Self Care | Payer: Medicare Other | Source: Ambulatory Visit | Attending: Nephrology | Admitting: Nephrology

## 2015-10-22 DIAGNOSIS — D631 Anemia in chronic kidney disease: Secondary | ICD-10-CM | POA: Diagnosis not present

## 2015-10-22 DIAGNOSIS — N183 Chronic kidney disease, stage 3 (moderate): Secondary | ICD-10-CM | POA: Diagnosis not present

## 2015-10-22 LAB — FERRITIN: FERRITIN: 361 ng/mL — AB (ref 24–336)

## 2015-10-22 LAB — RENAL FUNCTION PANEL
Albumin: 3.5 g/dL (ref 3.5–5.0)
Anion gap: 8 (ref 5–15)
BUN: 58 mg/dL — AB (ref 6–20)
CHLORIDE: 95 mmol/L — AB (ref 101–111)
CO2: 38 mmol/L — AB (ref 22–32)
CREATININE: 2.05 mg/dL — AB (ref 0.61–1.24)
Calcium: 9 mg/dL (ref 8.9–10.3)
GFR calc Af Amer: 38 mL/min — ABNORMAL LOW (ref >60)
GFR, EST NON AFRICAN AMERICAN: 33 mL/min — AB (ref >60)
Glucose, Bld: 104 mg/dL — ABNORMAL HIGH (ref 65–99)
POTASSIUM: 4 mmol/L (ref 3.5–5.1)
Phosphorus: 3.9 mg/dL (ref 2.5–4.6)
Sodium: 141 mmol/L (ref 135–145)

## 2015-10-22 LAB — POCT HEMOGLOBIN-HEMACUE: HEMOGLOBIN: 10.2 g/dL — AB (ref 13.0–17.0)

## 2015-10-22 LAB — IRON AND TIBC
IRON: 36 ug/dL — AB (ref 45–182)
Saturation Ratios: 15 % — ABNORMAL LOW (ref 17.9–39.5)
TIBC: 248 ug/dL — ABNORMAL LOW (ref 250–450)
UIBC: 212 ug/dL

## 2015-10-22 MED ORDER — DARBEPOETIN ALFA 200 MCG/0.4ML IJ SOSY
200.0000 ug | PREFILLED_SYRINGE | INTRAMUSCULAR | Status: DC
  Administered 2015-10-22: 200 ug via SUBCUTANEOUS

## 2015-10-22 MED ORDER — DARBEPOETIN ALFA 200 MCG/0.4ML IJ SOSY
PREFILLED_SYRINGE | INTRAMUSCULAR | Status: AC
  Filled 2015-10-22: qty 0.4

## 2015-11-03 ENCOUNTER — Other Ambulatory Visit (HOSPITAL_COMMUNITY): Payer: Self-pay | Admitting: *Deleted

## 2015-11-04 ENCOUNTER — Ambulatory Visit (HOSPITAL_COMMUNITY): Payer: Medicare Other

## 2015-11-09 ENCOUNTER — Other Ambulatory Visit (HOSPITAL_COMMUNITY): Payer: Self-pay | Admitting: *Deleted

## 2015-11-10 ENCOUNTER — Ambulatory Visit (HOSPITAL_COMMUNITY)
Admission: RE | Admit: 2015-11-10 | Discharge: 2015-11-10 | Disposition: A | Discharge status: Home / Self Care | Payer: Medicare Other | Source: Ambulatory Visit | Attending: Nephrology | Admitting: Nephrology

## 2015-11-10 DIAGNOSIS — N183 Chronic kidney disease, stage 3 (moderate): Secondary | ICD-10-CM | POA: Insufficient documentation

## 2015-11-10 DIAGNOSIS — Z5181 Encounter for therapeutic drug level monitoring: Secondary | ICD-10-CM | POA: Diagnosis not present

## 2015-11-10 DIAGNOSIS — D631 Anemia in chronic kidney disease: Secondary | ICD-10-CM | POA: Diagnosis not present

## 2015-11-10 DIAGNOSIS — D509 Iron deficiency anemia, unspecified: Secondary | ICD-10-CM | POA: Diagnosis not present

## 2015-11-10 DIAGNOSIS — Z79899 Other long term (current) drug therapy: Secondary | ICD-10-CM | POA: Insufficient documentation

## 2015-11-10 LAB — POCT HEMOGLOBIN-HEMACUE: HEMOGLOBIN: 9.5 g/dL — AB (ref 13.0–17.0)

## 2015-11-10 MED ORDER — DARBEPOETIN ALFA 200 MCG/0.4ML IJ SOSY
PREFILLED_SYRINGE | INTRAMUSCULAR | Status: AC
  Administered 2015-11-10: 200 ug via SUBCUTANEOUS
  Filled 2015-11-10: qty 0.4

## 2015-11-10 MED ORDER — SODIUM CHLORIDE 0.9 % IV SOLN
510.0000 mg | INTRAVENOUS | Status: DC
  Administered 2015-11-10: 510 mg via INTRAVENOUS
  Filled 2015-11-10: qty 17

## 2015-11-10 MED ORDER — DARBEPOETIN ALFA 200 MCG/0.4ML IJ SOSY
200.0000 ug | PREFILLED_SYRINGE | INTRAMUSCULAR | Status: DC
  Administered 2015-11-10: 200 ug via SUBCUTANEOUS

## 2015-11-17 ENCOUNTER — Encounter: Payer: Self-pay | Admitting: Internal Medicine

## 2015-11-17 ENCOUNTER — Ambulatory Visit (INDEPENDENT_AMBULATORY_CARE_PROVIDER_SITE_OTHER): Payer: Medicare Other | Admitting: Internal Medicine

## 2015-11-17 VITALS — BP 138/80 | HR 87 | Ht 73.0 in | Wt 274.8 lb

## 2015-11-17 DIAGNOSIS — J9612 Chronic respiratory failure with hypercapnia: Secondary | ICD-10-CM

## 2015-11-17 DIAGNOSIS — J449 Chronic obstructive pulmonary disease, unspecified: Secondary | ICD-10-CM | POA: Diagnosis not present

## 2015-11-17 NOTE — Patient Instructions (Addendum)
ICD-9-CM ICD-10-CM   1. Chronic obstructive pulmonary disease, unspecified COPD type ([place]) 496 J44.9   2. Hypercapnic respiratory failure, chronic (HCC) 518.83 J96.12    Continue o2 day time with trach collar and night vent Nebs as before Med as before Can try steam inhalation for mucus Will discuss decannulation when body mass index normalizes  Followup  - 6 months or sooner if needed

## 2015-11-17 NOTE — Progress Notes (Signed)
Subjective:     Patient ID: [name] [name], male   DOB: Oct 20, 1950, 65 y.o.   MRN: [id number]  HPI  01/23/11- COPD, CAD/diastolic dysfunction, OSA, chronic respiratory failure, obesity Hypoventilation, cor pulmonale Hosp since last here- once for "shakes"-they told him wasn't using CPAP right, once for epistaxis, once to place urinary stent, and has had a right ? renal biopsy. Told anemic.  Breathing stable, but can't lie flat- feels smothered even on O2.  Runs O2 2-3L/M usually. I gave permision to go to 4 during exertion. Notes "tiresome" feeling midchest, related to exertion/ climbing stairs and relieved by rest. Told to pace himself and not try to push through that.  Discussed need for O2. He doesn't sleep well with his BiPAP 15/12. Discussed sleep hygiene- discussed room temperature. He was more comfortable with autotitration in hosp and we discussed a trial of that at home.  Has restarted diuretic since leg edema has come back.   05/25/11-  COPD, CAD/diastolic dysfunction, OSA, chronic respiratory failure, obesity Hypoventilation, cor pulmonale Went to ER last week- short of breath. Air wasn't satisfying. Was started back on shot for anemia. Feels some better.  Reports sneezing, postnasal drainage and a sense of mucus in his upper chest. Denies fever, sore throat and doesn't think he has a cold.  06/28/11-  COPD, CAD/diastolic dysfunction, OSA, chronic respiratory failure, obesity Hypoventilation, cor pulmonale Recently hospitalized October 25-29, notes reviewed with him and x-ray images reviewed by me. He was hospitalized for hypercapnic respiratory failure with transient encephalopathy and renal insufficiency. Since discharge he has regained some ankle edema while off of furosemide. He has a nephrology appointment later this month. He is concerned that he was taken off his diabetes medicines and is directed to discuss this with his primary physician immediately. He has some persistent  soreness across his anterior chest wall at the level of the xiphoid but otherwise breathing better with less exertional dyspnea and before he went in the hospital. He remains dependent on continuous oxygen at 2 L. He continues his BiPAP machine all night, every night but says he was sleeping better with it  on autotitration with a lower pressure used in the hospital.  09/25/11- COPD, CAD/diastolic dysfunction, OSA, chronic respiratory failure, obesity Hypoventilation, cor pulmonale Since last here he was hospitalized for respiratory failure with CHF and obesity hypoventilation. Better with diuresis. BiPAP 13/10 is now comfortable and used all night every night with supplemental oxygen 4 L. Tolerated colonoscopy without respiratory distress. Uses a rescue inhaler only occasionally but says it does help then.  11/23/11- COPD, CAD/diastolic dysfunction, OSA, chronic respiratory failure, obesity Hypoventilation, cor pulmonale She is noticing some increased shortness of breath on exertion such as getting in the car but no increase in ankle edema which is well controlled. Occasional cough is not progressive or productive. Noticing more rhinorrhea with the pollen. Continues BiPAP  I 13/E 10 all night every night.  02/07/12- COPD, CAD/diastolic dysfunction, OSA, chronic respiratory failure, obesity Hypoventilation, cor pulmonale  pt states doing some better.wakes up out of a nap coughing feels like he's choking.  Post Hosp- 01/14/12- 01/23/12-discharge diagnoses reviewed with him: Coronary artery disease/CABG, diastolic dysfunction, COPD with chronic respiratory failure, acute respiratory failure with hypoxia, acute on chronic renal failure, DM. He was intubated for 4 days and treated with Zosyn, vancomycin and Avelox, Levaquin, Zithromax and Rocephin. Cultures negative. He was to wean off of prednisone over 3 days. He was hoarse after intubation but that has resolved. He is  using oxygen 1-2 L and BiPAP  13/10 at  night. He feels he is pretty close to his baseline.  03/28/12-  COPD, CAD/diastolic dysfunction/ chronic CHF, OSA, chronic respiratory failure, obesity Hypoventilation, cor pulmonale  6 MWT today. Pt states breathing has been "okay" since last OV. Pt states having trouble falling alseep-not sleeping well on BiPAP. Pt states mask fit well but thinks the pressure needs to be adjusted again. Currently 13/10+ oxygen at 2 L/ Advanced. He sleeps better in a recliner with oxygen but without his BiPAP. Getting iron injections for anemia. We discussed shortness of breath and anemia. Pain left anterior axillary line described as a sore ache over the past week. He is able to raise his arm. Thinks he maybe lifted something wrong. COPD assessment test (CAT) 21/40 CXR 01/14/12- 1 view- IMPRESSION:  Stable support apparatus. Residual mild interstitial prominence  with slight improvement in aeration. No convincing pulmonary  edema. Probable small left pleural effusion with left basilar  atelectasis or infiltrate.  Original Report Authenticated By: [name], M.D.   [id number], CAD/diastolic dysfunction/ chronic CHF, OSA, chronic respiratory failure, obesity Hypoventilation, cor pulmonale Hospitalized again between March 9 and 12 for acute on chronic renal failure with congestive heart failure, chronic respiratory failure on BiPAP and acute respiratory failure with hypoxia. He says he is feeling better at this time. Continues oxygen 3 L/Advanced. He has been using BiPAP 13/10 but thinks he slept better with CPAP. We discussed options. Usually when he decompensates it is from fluid overload triggering CO2 retention and hypoventilation.  CXR 10/20/12 IMPRESSION:  Vascular congestion and cardiomegaly, with small bilateral pleural  effusions and bibasilar airspace opacification. Findings are most  compatible with recurrent pulmonary edema, though underlying  pneumonia cannot be excluded.  Original Report  Authenticated By: [name], M.D.  12/02/2012 [facility] follow up  Returns for a post hospital follow up .  Reports doing well overall but still having some SOB, thinks this may be due to his pressure not being high enough on BIPAP. Since decreasing BIPAP 1 month ago  down to 12/10 he does not feel as good. Trouble sleeping.  Was admitted for decompensated cor pulmonale w/ fluid overload with subsequent acute on chronic hypercarbic resp failure.  He admits he can not afford his advair and spiriva- therefore he is not taking.  He does not wear BIPAP everynight. We discussed the implications of this and untreated OHS/OSA. He naps a lot but does not wear BIPAP.  He was treated with diuresis with improvement.  Discharged on Lasix 40mg  . Lower dose due to bump in scr. Has OP follow up with Renal d/t renal insufficiency.   02/05/13- COPD, CAD/diastolic dysfunction/ chronic CHF, OSA, chronic respiratory failure, obesity Hypoventilation, cor pulmonale Hosp 6/16-6/19/14 Acute on Chronic Resp Failure Discharge Plan:  -pt educated on BiPAP compliance at home  -continue pulmicort, scheduled albuterol / atrovent (med cost has been an issue)  -wean prednisone to off  -given information for [employer] at [employer]. Pulmonary Rehab was a cost issue (45$ per visit)  -RN home health to review medications and reinforce need for BiPAP 12/12 wO2 2-3L for sleep, O2 3-4 awake.  FOLLOWS FOR: Breathing has improved since leaving the hospital. Reports DOE, chest tightness with exertion and slight coughing from time to time. Wife needs FMLA form. CXR 01/29/13 1View IMPRESSION:  Slightly improved aeration in the lungs may represent decreasing  edema.  Original Report Authenticated By: Markus Daft, M.  04/11/13- COPD, CAD/diastolic  dysfunction/ chronic CHF, OSA, chronic respiratory failure, obesity Hypoventilation, cor pulmonale FOLLOWS FOR: reports breathing is essentially unchanged but he feels like he's  "burning through more oxygen than I used to." denies wheezing, increased SOB, tightness, increased cough, f/c/s Just in hospital again acute on chronic respiratory failure with CHF and COPD. Today he feels well except for frontal headache. Continuing oxygen at 2.5 L. BIPAP Auto 5-15/ O2 2L/ Advanced CXR 1V 8/14 IMPRESSION:  1. Extubated, enteric tube removed. Stable lung volumes.  2. Stable ventilation. Small pleural effusions.  Electronically Signed  By: Lars Pinks  On: 04/02/2013 07:43  06/23/2013 [facility] follow up  Patient presents for a post hospital followup. Patient was admitted October 7 through October 30 for acute on chronic respiratory failure. Patient did require intubation for airway protection in the emergency room due to acute encephalopathy. Unfortunately, patient was unable to be weaned from the ventilator and underwent a tracheostomy. Patient was weaned to trach collar during the daytime and vent support. At bedtime. Lurline Idol was placed by ENT Dr. Redmond Baseman. He was transitioned to an extra long #6 trach prior to discharge.  Patient returns today accompanied by his wife. Patient reports that he has been doing okay. He strength. He is tolerating vent support at night. However, he does feel that this is uncomfortable in certain times. Vent settings at night are PRVC at a rate of 14 with a tidal volume of 450 cc with FiO2 of 40%, and a PEEP of 5. Patient has been using Passy-Muir valve for speech however, over the last couple days, feels it's been more difficulties than his usual. He does have follow with ENT later this week for trach evaluation. Patient denies any hemoptysis, trach site. Bleeding, Orthopnea, PND, or increased leg swelling. Has been eating okay without any noted , dysphagia. Appetite is decreased with no nausea, vomiting, diarrhea.  REC Continue on current regimen.  follow up with ENT this week for [facility] evaluation.  I will call with labs and xray results.   Continue on Trach Collar during daytime  Continue on Vent support At bedtime   Follow up Dr. Chase Caller in 6-8 weeks and As needed .  Please contact office for sooner follow up if symptoms do not improve or worsen or seek emergency care   Late Add : ? Right sided infiltrate  Add Levaquin 500mg  daily x 7 days #7 , no refills.    OV 07/21/2013 Followup chronic respiratory failure status post tracheostomy. Since seeing my nurse practitioner a month ago he is doing well. Uses ventilator at night and daytime is on tracheostomy collar. He is not compliant with his Passy-Muir valve because it gives him a sensation of suffocation. But he is able to talk with open tracheostomy. He still reports chronic stable dyspnea on exertion for walking a block or sometimes half a block only. This is relieved by rest. Rated as moderate. There is no associated chest pain no worsening cough or phlegm. He did have some hemoptysis yesterday and he is trying to get in contact with the ENT specialist. As noted just beside bleeding. He denies any orthopnea or paroxysmal nocturnal dyspnea. Eating okay.  Lab review I do note that is not had a CT scan of the chest since 2012 rec Glad you are doing well CMA will refer you to pumonary rehab on basis of chronic resp failure Please do CT scan chest for pleural fibrosis, pleural plaques Followup with ENT> Dr Redmond Baseman, changed trach on 12/15  07/31/2013  f/u ov/Wert re: worse since 07/24/13 (day of CT Chest - see below) Chief Complaint  Patient presents with  . Acute Visit    Pt c/o increased SOB for the past several days. He states gets out of breath with any sort of exertion.  He has minimal cough that is non prod.   mucus is clear/ not bloody Baseline use of alb not much at all,  One every few days at most Assoc with increased fluid in legs  But followed by Gwenlyn Found who adjusts his lasix  No obvious patterns in day to day or daytime variabilty or assoc chronic cough or cp or  chest tightness, subjective wheeze overt sinus or hb symptoms. No unusual exp hx or h/o childhood pna/ asthma or knowledge of premature birth.  Sleeping ok without nocturnal  or early am exacerbation  of respiratory  c/o's or need for noct saba. Also denies any obvious fluctuation of symptoms with weather or environmental changes or other aggravating or alleviating factors except as outlined above    09/15/2013 Follow up  Returns for a 2 month follow up for COPD, chronic resp failure -trach dependent.  Reports breathing has worsened x2 weeks w/ increased SOB, chest tightness, cough with white mucus. Coughing and congestion make it difficult to use vent at night . Denies any f/c/s, nausea, vomiting, hemoptysis, edema.  Was referred to pulmonary rehab however could not afford co-pay amounts. We discussed looking into pt assistance.    OV 10/28/2013  Chief Complaint  Patient presents with  . Follow-up    Pt is c/o rattling in his chest, and dry cough. Pt also c/o increased swelling in both ankles.     Followup chronic respiratory failure status post tracheostomy by ENT. Chronic respiratory failure on account of COPD, OSA, diastolic heart failure  - Overall he is stable. He reports using the ventilator at night but occasionally he is noncompliant. When he does not use the ventilator the next day he is more fatigued according to his wife. In the daytime he is on trach collar on room air of liters of oxygen. He occasionally uses his Passy-Muir valve for talk but otherwise feels a sense of suffocation. The last few weeks he's noticed some increased pedal edema and despite increasing and self titrating his Lasix. In association with this he is more rattling in his chest and more shortness of breath and cough but there is no change in sputum color. The sputum is not pink and frothy. He has not seen his cardiologist Dr.? Dr Adora Fridge a while and he plans to see him more expeditiously. Denies any chest pain  cough fever chills or wheezing.  Copd appears stable However, diastolic chf might be worse My office will help you seen by Dr Gwenlyn Found office next day or two  REturn to see me in 4 months or sooner if needed  - continue trach care through ENT clinic   OV 02/24/2014  Chief Complaint  Patient presents with  . Follow-up    Pt c/o increase SOB, cough with clear mucous, intermittent edema in BIL ankles. C/o upper mid sternal CP and left upper CP when on ventilator.    Followup chronic respiratory failure on account of obesity, sleep apnea, COPD and diastolic heart failure  - He continues to feel poorly. For the last month or so he produces increased shortness of breath associated with worsening pedal edema. Is only on Lasix 40 mg once daily. He says he is compliant with his  nebulizers. Although, he is noncompliant with his ventilator stating that the pressures are too high. He probably uses it less than 50% of the time. In addition he is very keen on getting his tracheostomy removed permanently this is despite our advice that he keep it in permanently. He is frustrated by his quality of life. I've explained to him that weight loss is paramount but he is not too keen on this. He reports that his cough, sputum or in sputum color or roughly the same as baseline without any fever >>pred taper , increased lasix 40mg  Twice daily    03/10/14 Follow up COPD/OHS/ Trach Depend RF /Noct Vent Returns for follow up for COPD . Seen last ov with COPD flare and suspected decompensated Diastolic CHF. He was treated with prednisone taper and diuretics were increased.  He says he is some better but gets winded easily.  Encouraged to use vent at night .  Leg swelling slightly better. Labs last week showed scr stable.  No chest pain , hemoptyiss, fever or n/vd/     O 05/12/2014  Chief Complaint  Patient presents with  . Follow-up    Pt states his breathing has slightly worsened since last OV. Pt states he has a  slight increase in SOB, non prod cough and chest pain when coming off the vent in the morning.    Followup chronic respiratory failure on account of obesity, sleep apnea, COPD and diastolic heart failure and Trach Depend RF /Noct Vent   - presents with wife. Continues to report frustration having trach in life; more resigned to having it. He is frustrated by class 3 exertional fatigue and dyspnea. REcently hgb 6.3gm% at renal; no obvious bleed and renal managing it but dyspnea is chronic. Noc hange in baseline cough and white sputum of moderate intensity. Continues vent at night; more compliant.  Uptodate with flu shot. He has cards appt pending in  <  2 months per hx   12/10/2014 [facility] follow up  Patient presents for a post hospital follow-up Patient was admitted April 5 through April 7 for pneumonia and acute on chronic hypercarbic respiratory failure Influenza panel was negative. He was treated with IV antibiotics, steroids, and nebulized bronchodilators. CT chest showed a right middle lobe pneumonia. Patient remains on trach collar 4.5L  during the daytime with vent support at night. He says since discharge he is feeling some better. He still continues to feel weak. He denies any hemoptysis, orthopnea, PND or leg swelling Seen by ENT with trach change  Last week.  Remains on Pulmicort and brovana nebs.  Wt is down from discharge (~6 lbs ) , decreased leg swelling.  Does tell me he turns up O2 to 6-7 L when he feels sob even though O2 sats >90%.  . We discussed O2 sats gaol of 90-92%.    01/22/2015 Follow up: COPD /trach dependent , noct vent  Returns for 2 week follow up.  Patient was given Cipro last visit for persistent cough and congestion. Last ov lasix increased for fluid overload.  Reports breathing is improved since last ov, cough is now clear, still having some occasional tightness when coming off the vent. Overall feels better.   CXR shows slight improvement in  aeration today  Wt is down .Leg swelling is less.   Going to ENT to discuss trach options-would prefer to not have trach.   He denies any chest pain, orthopnea, PND or increased  leg swelling  OV 03/10/2015  Chief Complaint  Patient presents with  . Follow-up    Still having SOB and coughing up mostly clear thick mucus with occ green mucus.     Followup chronic respiratory failure on account of obesity, sleep apnea, COPD and diastolic heart failure   Is improved from his recent exacerbation with H. influenzae.. Currently he is here with his wife. Both at test that he is doing well. He uses nocturnal ventilation. Daytime tracheostomy with Passy-Muir valve. He is very adamant that he wants to get his tracheostomy removed no matter what the consequences are. Currently feeling fine. Baseline chronic venous stasis edema present. There are no acute complaints. >>discussed with ENT to set up for trial of trach capping   .04/14/2015 Follow up : COPD /trach dependent , noct vent , OSA/OHS, Diastolic CHF  Pt returns for 1 month follow up  Complains of increased cough and congestion Pt c/o SOB and wheezing at times, chest congestion with a prod cough with light green/yellow mucus.  Denies any chest tightness, fever, or nausea and vomitting. Pt has not made appt with ENT for trach capping.  Pt was suppose to see ENT to be set up for a trial of Trach capping for 4 weeks . Then to decide if able to decanulate.  Would like [place] to call over and get this set up .  Remains on Brovana and Pulmicort .  On Oxygen 4l/m in daytime with trach collar and Vent at At bedtime  .  No chest pain, orthopnea, edema or fever   Recently moved. Starting to get settled in.    06/25/2015 [facility] follow up : COPD /trach dependent , noct vent , OSA/OHS, Diastolic CHF  Patient presents for a post hospital follow-up Patient was admitted 18th to October 21 for acute on chronic hypercarbic respiratory failure. He  was admitted to evaluate for possible decannulation.  Trials of trach capping with nocturnal BiPAP were tried, however, patient has significant worsening respiratory acidosis with PCO2's greater than 100, with associated decreased level of consciousness. Long discussion with patient and wife that this is not a good idea and he should continue with tracheostomy. Since discharge he is doing okay. Remains on trach collar daytime and vent At bedtime   Cough , congestion are at baseline.  Gets winded easily but at his baseline He is sad about trach, support provided.  He denies chest pain, orthopnea , fever or hemoptysis   OV 08/18/2015  Chief Complaint  Patient presents with  . Follow-up    Pt c/o chest congestion with difficulty bring the mucus up and c/o increase in SOB from baseline. Pt denies issues on the vent at night.     Follow-up severe COPD tracheostomy dependent nocturnal ventilator OSA/OHS/diastolic heart failure. Failed attempted decannulation October 2016  Presents for routine follow-up with his wife as always. He says that he still has hope that one day he can come off the ventilator and tracheostomy. He continues to be obese although sees says he has lost weight. His current BMI is 36. Overall he is stable. Mucus is now clear. Although after telling me stable he suddenly said that he is having some mild worsening dyspnea for the past week. In addition when he eats some food he feels a noise in his throat. He then has to clear his food. He denies this is dysphagia. He absolutely is certain this is not dysphagia. He is asking for treatment especially for prednisone or antibiotics although there  is no fever or change in sputum color or volume. He does not want to go through a GI evaluation or speech evaluation. He says in October 2016 he was advised to chin tuck and eat and he says current problem is not dysphagia.   OV 11/17/2015  Chief Complaint  Patient presents with  . Follow-up     Pt states his breathing is overall unchanged. Pt c/o non prod cough with congestion that he is unable to cough up.   Follow-up severe COPD tracheostomy dependent nocturnal ventilator OSA/OHS/diastolic heart failure. Failed attempted decannulation October 2016  Presents for routine follow-up with his wife as always. He says that he still has hope that one day he can come off the ventilator and tracheostomy. He continues to be obese although sees says he has lost weight but this is due to lasix. He is frustrated by his trach and vent. Says still not reconciled. His current Body mass index is 36.26 kg/(m^2). Marland Kitchen Overall he is stable. Mucus is now clear. Throat problme resolved after prednisone at last ov. Has upcoming cards visit     Current outpatient prescriptions:  .  acetaminophen (TYLENOL) 500 MG tablet, Take 500 mg by mouth every 6 (six) hours as needed for mild pain or moderate pain., Disp: , Rfl:  .  allopurinol (ZYLOPRIM) 100 MG tablet, Take 1 tablet by mouth daily., Disp: , Rfl:  .  arformoterol (BROVANA) 15 MCG/2ML NEBU, Take 2 mLs (15 mcg total) by nebulization 2 (two) times daily., Disp: 28 mL, Rfl: 0 .  aspirin 81 MG tablet, Take 81 mg by mouth daily., Disp: , Rfl:  .  budesonide (PULMICORT) 0.5 MG/2ML nebulizer solution, USE ONE VIAL IN NEBULIZER TWICE DAILY, Disp: 120 mL, Rfl: 3 .  clopidogrel (PLAVIX) 75 MG tablet, TAKE ONE TABLET BY MOUTH ONCE DAILY (NEED  OFFICE  VISIT), Disp: 30 tablet, Rfl: 4 .  COLCRYS 0.6 MG tablet, Take 0.6 mg by mouth daily as needed. , Disp: , Rfl:  .  dextromethorphan-guaiFENesin (MUCINEX DM) 30-600 MG 12hr tablet, Take 1 tablet by mouth 2 (two) times daily. Patient takes for mucus relief, Disp: , Rfl:  .  furosemide (LASIX) 40 MG tablet, Take 2 tablets in AM, 1 tablet in PM, Disp: 90 tablet, Rfl: 5 .  metolazone (ZAROXOLYN) 2.5 MG tablet, TAKE ONE TABLET BY MOUTH ONCE DAILY ON  MONDAY,  WEDNESDAY,  AND  FRIDAY  30  MINUTES  BEFORE  LASIX, Disp: 30 tablet,  Rfl: 3 .  nitroGLYCERIN (NITROSTAT) 0.4 MG SL tablet, Place 1 tablet (0.4 mg total) under the tongue every 5 (five) minutes as needed. For chest pain, Disp: 25 tablet, Rfl: 6   Allergies  Allergen Reactions  . Amlodipine Besy-Benazepril Hcl Swelling    Lips swell  . Percocet [Oxycodone-Acetaminophen] Other (See Comments)    hallucinations    Immunization History  Administered Date(s) Administered  . H1N1 09/07/2008  . Influenza Split 05/23/2011, 06/17/2012, 05/05/2014  . Influenza,inj,Quad PF,36+ Mos 05/22/2013, 05/19/2015      Review of Systems     Objective:   Physical Exam  Filed Vitals:   11/17/15 1103  BP: 138/80  Pulse: 87  Height: 6\' 1"  (1.854 m)  Weight: 274 lb 12.8 oz (124.648 kg)  SpO2: 99%   Estimated body mass index is 36.26 kg/(m^2) as calculated from the following:   Height as of this encounter: 6\' 1"  (1.854 m).   Weight as of this encounter: 274 lb 12.8 oz (124.648 kg).  Obese male Seated in the chair Flat frustrated affect Tracheostomy present. Compared to the past much less coughing and secretions Oxygen on Barrel chest with overall diminished air entry no wheeze Diminished heart sounds heard with systolic murmur present Chronic venous stasis edema    Assessment:       ICD-9-CM ICD-10-CM   1. Hypercapnic respiratory failure, chronic (HCC) 518.83 J96.12   2. Chronic obstructive pulmonary disease, unspecified COPD type ([place]) 496 J44.9        Plan:      Continue o2 day time with trach collar and night vent Nebs as before Med as before Can try steam inhalation for mucus Will discuss decannulation when body mass index normalizes  Followup  - 6 months or sooner if needed    (> 50% of this 15 min visit spent in face to face counseling or/and coordination of care)   Dr. Brand Males, M.D., [facility].C.P Pulmonary and Critical Care Medicine Staff Physician [facility] Pager: [phone], If  no answer or between  15:00h - 7:00h: call [phone]  11/17/2015 11:55 AM

## 2015-11-18 ENCOUNTER — Other Ambulatory Visit (HOSPITAL_COMMUNITY): Payer: Self-pay | Admitting: *Deleted

## 2015-11-19 ENCOUNTER — Encounter (HOSPITAL_COMMUNITY)
Admission: RE | Admit: 2015-11-19 | Discharge: 2015-11-19 | Disposition: A | Discharge status: Home / Self Care | Payer: Medicare Other | Source: Ambulatory Visit | Attending: Nephrology | Admitting: Nephrology

## 2015-11-19 DIAGNOSIS — D631 Anemia in chronic kidney disease: Secondary | ICD-10-CM | POA: Insufficient documentation

## 2015-11-19 DIAGNOSIS — Z5181 Encounter for therapeutic drug level monitoring: Secondary | ICD-10-CM | POA: Insufficient documentation

## 2015-11-19 DIAGNOSIS — D509 Iron deficiency anemia, unspecified: Secondary | ICD-10-CM | POA: Insufficient documentation

## 2015-11-19 DIAGNOSIS — N183 Chronic kidney disease, stage 3 (moderate): Secondary | ICD-10-CM | POA: Insufficient documentation

## 2015-11-19 DIAGNOSIS — Z79899 Other long term (current) drug therapy: Secondary | ICD-10-CM | POA: Diagnosis not present

## 2015-11-19 MED ORDER — SODIUM CHLORIDE 0.9 % IV SOLN
510.0000 mg | INTRAVENOUS | Status: AC
  Administered 2015-11-19: 510 mg via INTRAVENOUS
  Filled 2015-11-19: qty 17

## 2015-11-25 ENCOUNTER — Encounter (HOSPITAL_COMMUNITY)
Admission: RE | Admit: 2015-11-25 | Discharge: 2015-11-25 | Disposition: A | Discharge status: Home / Self Care | Payer: Medicare Other | Source: Ambulatory Visit | Attending: Nephrology | Admitting: Nephrology

## 2015-11-25 DIAGNOSIS — N183 Chronic kidney disease, stage 3 (moderate): Secondary | ICD-10-CM | POA: Diagnosis not present

## 2015-11-25 LAB — RENAL FUNCTION PANEL
ANION GAP: 13 (ref 5–15)
Albumin: 3.7 g/dL (ref 3.5–5.0)
BUN: 92 mg/dL — AB (ref 6–20)
CHLORIDE: 89 mmol/L — AB (ref 101–111)
CO2: 41 mmol/L — AB (ref 22–32)
CREATININE: 2.53 mg/dL — AB (ref 0.61–1.24)
Calcium: 8.8 mg/dL — ABNORMAL LOW (ref 8.9–10.3)
GFR, EST AFRICAN AMERICAN: 29 mL/min — AB (ref >60)
GFR, EST NON AFRICAN AMERICAN: 25 mL/min — AB (ref >60)
GLUCOSE: 140 mg/dL — AB (ref 65–99)
Phosphorus: 3.2 mg/dL (ref 2.5–4.6)
Potassium: 3.6 mmol/L (ref 3.5–5.1)
SODIUM: 143 mmol/L (ref 135–145)

## 2015-11-25 MED ORDER — DARBEPOETIN ALFA 200 MCG/0.4ML IJ SOSY
200.0000 ug | PREFILLED_SYRINGE | INTRAMUSCULAR | Status: DC
  Administered 2015-11-25: 200 ug via SUBCUTANEOUS

## 2015-11-25 MED ORDER — DARBEPOETIN ALFA 200 MCG/0.4ML IJ SOSY
PREFILLED_SYRINGE | INTRAMUSCULAR | Status: AC
  Filled 2015-11-25: qty 0.4

## 2015-11-26 LAB — POCT HEMOGLOBIN-HEMACUE: Hemoglobin: 9.7 g/dL — ABNORMAL LOW (ref 13.0–17.0)

## 2015-12-07 ENCOUNTER — Encounter: Payer: Self-pay | Admitting: Cardiovascular Disease

## 2015-12-07 ENCOUNTER — Ambulatory Visit (INDEPENDENT_AMBULATORY_CARE_PROVIDER_SITE_OTHER): Payer: Medicare Other | Admitting: Cardiovascular Disease

## 2015-12-07 VITALS — BP 116/68 | HR 96 | Ht 72.0 in | Wt 271.0 lb

## 2015-12-07 DIAGNOSIS — I739 Peripheral vascular disease, unspecified: Secondary | ICD-10-CM

## 2015-12-07 DIAGNOSIS — I5032 Chronic diastolic (congestive) heart failure: Secondary | ICD-10-CM

## 2015-12-07 DIAGNOSIS — I110 Hypertensive heart disease with heart failure: Secondary | ICD-10-CM

## 2015-12-07 DIAGNOSIS — I2583 Coronary atherosclerosis due to lipid rich plaque: Principal | ICD-10-CM

## 2015-12-07 DIAGNOSIS — I2510 Atherosclerotic heart disease of native coronary artery without angina pectoris: Secondary | ICD-10-CM | POA: Diagnosis not present

## 2015-12-07 NOTE — Progress Notes (Signed)
12/07/2015 CODEN Torreon   1951-04-29  [id number]  Primary Physician Philis Fendt, MD Primary Cardiologist: Lorretta Harp MD Renae Gloss   HPI:  The patient is a 65 -year-old obese African American male with history of coronary artery disease, diastolic dysfunction, COPD who is O2 dependent, peripheral vascular disease, chronic renal insufficiency stage 3, diabetes mellitus, hypertension and obstructive sleep apnea. I last saw him in the office 12/04/14.The patient was hospitalized earlier in the year with acute respiratory failure and hypoxia, likely related to volume overload from diastolic dyslipidemia. He was diuresed with IV Lasix, metolazone. He was also seen from April 3rd to April 7th, apparently with a COPD exacerbation. The patient has a stent in the left SFA which was put in in September of 2009 for claudication. He also does have a short-segment occlusion of the right mid SFA with tibioperoneal disease as well. He had coronary artery bypass grafting in 2001 with PCI stenting of his OM branch in December of 2005. Last cardiac catheterization was in July of 2011 revealing a patent LIMA to the LAD, 80% stenosis beyond that, and a patent vein to the large venous branch, a total vein to the PDA which was restented using a Taxus ion drug-eluting stent with a patent circumflex to the obtuse marginal branch and normal LV function.   His last nuclear stress test was February 2015. With ejection fraction was 62%; it was a low risk study with normal nuclear imaging. No significant change from previous study. This study was done for ST changes to his EKG which are still present and look similar to previous EKG with a right bundle branch block and slightly tachycardic at 104.   Since I saw him a year ago he's remained stable. He was hospitalized back in October for issues regarding his tracheostomy. He gets occasional chest pain which has not changed in frequency or severity.  Lower extremity edema has been under good control on oral diuretics.   Current Outpatient Prescriptions  Medication Sig Dispense Refill  . acetaminophen (TYLENOL) 500 MG tablet Take 500 mg by mouth every 6 (six) hours as needed for mild pain or moderate pain.    Marland Kitchen allopurinol (ZYLOPRIM) 100 MG tablet Take 1 tablet by mouth daily.    Marland Kitchen arformoterol (BROVANA) 15 MCG/2ML NEBU Take 2 mLs (15 mcg total) by nebulization 2 (two) times daily. 28 mL 0  . aspirin 81 MG tablet Take 81 mg by mouth daily.    . budesonide (PULMICORT) 0.5 MG/2ML nebulizer solution USE ONE VIAL IN NEBULIZER TWICE DAILY 120 mL 3  . clopidogrel (PLAVIX) 75 MG tablet TAKE ONE TABLET BY MOUTH ONCE DAILY (NEED  OFFICE  VISIT) 30 tablet 4  . COLCRYS 0.6 MG tablet Take 0.6 mg by mouth daily as needed.     Marland Kitchen dextromethorphan-guaiFENesin (MUCINEX DM) 30-600 MG 12hr tablet Take 1 tablet by mouth 2 (two) times daily. Patient takes for mucus relief    . furosemide (LASIX) 40 MG tablet Take 2 tablets in AM, 1 tablet in PM 90 tablet 5  . metolazone (ZAROXOLYN) 2.5 MG tablet TAKE ONE TABLET BY MOUTH ONCE DAILY ON  MONDAY,  WEDNESDAY,  AND  FRIDAY  30  MINUTES  BEFORE  LASIX 30 tablet 3  . nitroGLYCERIN (NITROSTAT) 0.4 MG SL tablet Place 1 tablet (0.4 mg total) under the tongue every 5 (five) minutes as needed. For chest pain 25 tablet 6   No current facility-administered medications for this  visit.    Allergies  Allergen Reactions  . Amlodipine Besy-Benazepril Hcl Swelling    LIPS SWELL ANGIOEDEMA  . Percocet [Oxycodone-Acetaminophen] Other (See Comments)    HALLUCINATIONS    Social History   Social History  . Marital Status: Married    Spouse Name: N/A  . Number of Children: N/A  . Years of Education: N/A   Occupational History  . Not on file.   Social History Main Topics  . Smoking status: Former Smoker -- 1.00 packs/day for 25 years    Types: Cigarettes    Quit date: 08/14/1998  . Smokeless tobacco: Never Used  .  Alcohol Use: No  . Drug Use: No  . Sexual Activity: Yes   Other Topics Concern  . Not on file   Social History Narrative     Review of Systems: General: negative for chills, fever, night sweats or weight changes.  Cardiovascular: negative for chest pain, dyspnea on exertion, edema, orthopnea, palpitations, paroxysmal nocturnal dyspnea or shortness of breath Dermatological: negative for rash Respiratory: negative for cough or wheezing Urologic: negative for hematuria Abdominal: negative for nausea, vomiting, diarrhea, bright red blood per rectum, melena, or hematemesis Neurologic: negative for visual changes, syncope, or dizziness All other systems reviewed and are otherwise negative except as noted above.    Blood pressure 116/68, pulse 96, height 6' (1.829 m), weight 271 lb (122.925 kg).  General appearance: alert and no distress Neck: no adenopathy, no carotid bruit, no JVD, supple, symmetrical, trachea midline and thyroid not enlarged, symmetric, no tenderness/mass/nodules Lungs: clear to auscultation bilaterally Heart: regular rate and rhythm, S1, S2 normal, no murmur, click, rub or gallop Extremities: trace bilateral lower extremity edema  EKG normal sinus rhythm at 96 with right bundle branch block and left anterior fascicular block (bifascicular block). I personally reviewed this EKG  ASSESSMENT AND PLAN:   Chronic diastolic CHF (congestive heart failure) [facility]) Mr. Walmsley has chronic diastolic heart failure. His last 2-D echo performed 05/21/13 revealed an ejection fraction of 55-60% with grade 1 diastolic dysfunction. He is on oral diuretic and has minimal peripheral edema.  PVD (peripheral vascular disease) ([place]) History of peripheral arterial disease with left SFA PTA September 2009 with a known occluded right SFA. He really is minimally ambulatory and we have not been following this by duplex ultrasound.  CAD (coronary artery disease) History of coronary artery  disease status post coronary artery bypass grafting X 6 in 2001. He had PCI stenting of his OM branch December 2005. His last cardiac catheterization performed July 2011 revealed a patent LIMA to the LAD, 80% stenosis beyond that a patent vein to large obtuse marginal branch as well as a vein to the PDA which was stented using a Taxus and drug-eluting stent He had normal LV function at that time. His last nuclear stress test February 2015 was low risk and nonischemic. He gets occasional chest pain which is not changed frequency or severity.  Hypertensive heart disease History of hypertension blood pressure measured at 116/68. He is not on antihypertensive medications.      Lorretta Harp MD FACP,FACC,FAHA, [facility] 12/07/2015 12:15 PM

## 2015-12-07 NOTE — Assessment & Plan Note (Signed)
History of peripheral arterial disease with left SFA PTA September 2009 with a known occluded right SFA. He really is minimally ambulatory and we have not been following this by duplex ultrasound.

## 2015-12-07 NOTE — Assessment & Plan Note (Signed)
[name] [name] has chronic diastolic heart failure. His last 2-D echo performed 05/21/13 revealed an ejection fraction of 55-60% with grade 1 diastolic dysfunction. He is on oral diuretic and has minimal peripheral edema.

## 2015-12-07 NOTE — Assessment & Plan Note (Signed)
History of hypertension blood pressure measured at 116/68. He is not on antihypertensive medications.

## 2015-12-07 NOTE — Patient Instructions (Signed)
Medication Instructions:  Your physician recommends that you continue on your current medications as directed. Please refer to the Current Medication list given to you today.   Labwork: I will get your lab work from your Primary Care Physician.  Testing/Procedures: none  Follow-Up: Your physician wants you to follow-up in: 12 months with Dr. Berry. You will receive a reminder letter in the mail two months in advance. If you don't receive a letter, please call our office to schedule the follow-up appointment.   Any Other Special Instructions Will Be Listed Below (If Applicable).     If you need a refill on your cardiac medications before your next appointment, please call your pharmacy.   

## 2015-12-07 NOTE — Assessment & Plan Note (Signed)
History of coronary artery disease status post coronary artery bypass grafting X 6 in 2001. He had PCI stenting of his OM branch December 2005. His last cardiac catheterization performed July 2011 revealed a patent LIMA to the LAD, 80% stenosis beyond that a patent vein to large obtuse marginal branch as well as a vein to the PDA which was stented using a Taxus and drug-eluting stent He had normal LV function at that time. His last nuclear stress test February 2015 was low risk and nonischemic. He gets occasional chest pain which is not changed frequency or severity.

## 2015-12-08 ENCOUNTER — Telehealth: Payer: Self-pay | Admitting: Internal Medicine

## 2015-12-08 NOTE — Telephone Encounter (Signed)
LVM for pt to return call

## 2015-12-09 ENCOUNTER — Encounter (HOSPITAL_COMMUNITY): Payer: Medicare Other

## 2015-12-09 ENCOUNTER — Encounter (HOSPITAL_COMMUNITY)
Admission: RE | Admit: 2015-12-09 | Discharge: 2015-12-09 | Disposition: A | Discharge status: Home / Self Care | Payer: Medicare Other | Source: Ambulatory Visit | Attending: Nephrology | Admitting: Nephrology

## 2015-12-09 DIAGNOSIS — N183 Chronic kidney disease, stage 3 (moderate): Secondary | ICD-10-CM | POA: Diagnosis not present

## 2015-12-09 LAB — POCT HEMOGLOBIN-HEMACUE: HEMOGLOBIN: 9.8 g/dL — AB (ref 13.0–17.0)

## 2015-12-09 MED ORDER — DARBEPOETIN ALFA 200 MCG/0.4ML IJ SOSY
200.0000 ug | PREFILLED_SYRINGE | INTRAMUSCULAR | Status: DC
  Administered 2015-12-09: 200 ug via SUBCUTANEOUS

## 2015-12-09 NOTE — Telephone Encounter (Signed)
LMTCB

## 2015-12-09 NOTE — Telephone Encounter (Signed)
Sampled given to pt wife.

## 2015-12-09 NOTE — Telephone Encounter (Signed)
Ok to give a few Brovana samples  Suggest prednisone 20 mg, # 5, 1 daily

## 2015-12-09 NOTE — Telephone Encounter (Signed)
Patient called - returning our call. Patient can be reached at [phone]. Thanks-prm

## 2015-12-09 NOTE — Telephone Encounter (Signed)
PATIENT'S WIFE IN LOBBY for samples.

## 2015-12-09 NOTE — Telephone Encounter (Signed)
Pt calling for some samples of Brovana. Aware that we do have some samples that can be placed up front for him to pick up.   Pt c/o dry cough with mucus production at times, clear in color, and wheezing. Pt denies any increased SOB and chest tightness/pain. Requesting something be called in for his symptoms. Following all rec's per last OV with MR 11/17/15  Please advise Dr Annamaria Boots as Dr Chase Caller is not available today. Thanks.     Medication List       This list is accurate as of: 12/08/15 11:59 PM.  Always use your most recent med list.               acetaminophen 500 MG tablet  Commonly known as:  TYLENOL  Take 500 mg by mouth every 6 (six) hours as needed for mild pain or moderate pain.     allopurinol 100 MG tablet  Commonly known as:  ZYLOPRIM  Take 1 tablet by mouth daily.     arformoterol 15 MCG/2ML Nebu  Commonly known as:  BROVANA  Take 2 mLs (15 mcg total) by nebulization 2 (two) times daily.     aspirin 81 MG tablet  Take 81 mg by mouth daily.     budesonide 0.5 MG/2ML nebulizer solution  Commonly known as:  PULMICORT  USE ONE VIAL IN NEBULIZER TWICE DAILY     clopidogrel 75 MG tablet  Commonly known as:  PLAVIX  TAKE ONE TABLET BY MOUTH ONCE DAILY (NEED  OFFICE  VISIT)     COLCRYS 0.6 MG tablet  Generic drug:  colchicine  Take 0.6 mg by mouth daily as needed.     dextromethorphan-guaiFENesin 30-600 MG 12hr tablet  Commonly known as:  MUCINEX DM  Take 1 tablet by mouth 2 (two) times daily. Patient takes for mucus relief     furosemide 40 MG tablet  Commonly known as:  LASIX  Take 2 tablets in AM, 1 tablet in PM     metolazone 2.5 MG tablet  Commonly known as:  ZAROXOLYN  TAKE ONE TABLET BY MOUTH ONCE DAILY ON  MONDAY,  WEDNESDAY,  AND  FRIDAY  30  MINUTES  BEFORE  LASIX     nitroGLYCERIN 0.4 MG SL tablet  Commonly known as:  NITROSTAT  Place 1 tablet (0.4 mg total) under the tongue every 5 (five) minutes as needed. For chest pain       Allergies   Allergen Reactions  . Amlodipine Besy-Benazepril Hcl Swelling    LIPS SWELL ANGIOEDEMA  . Percocet [Oxycodone-Acetaminophen] Other (See Comments)    HALLUCINATIONS

## 2015-12-23 ENCOUNTER — Encounter (HOSPITAL_COMMUNITY)
Admission: RE | Admit: 2015-12-23 | Discharge: 2015-12-23 | Disposition: A | Discharge status: Home / Self Care | Payer: Medicare Other | Source: Ambulatory Visit | Attending: Nephrology | Admitting: Nephrology

## 2015-12-23 DIAGNOSIS — D631 Anemia in chronic kidney disease: Secondary | ICD-10-CM | POA: Diagnosis not present

## 2015-12-23 DIAGNOSIS — N183 Chronic kidney disease, stage 3 (moderate): Secondary | ICD-10-CM | POA: Diagnosis not present

## 2015-12-23 LAB — RENAL FUNCTION PANEL
ALBUMIN: 3.5 g/dL (ref 3.5–5.0)
Anion gap: 11 (ref 5–15)
BUN: 79 mg/dL — AB (ref 6–20)
CHLORIDE: 88 mmol/L — AB (ref 101–111)
CO2: 41 mmol/L — ABNORMAL HIGH (ref 22–32)
Calcium: 8.9 mg/dL (ref 8.9–10.3)
Creatinine, Ser: 2.53 mg/dL — ABNORMAL HIGH (ref 0.61–1.24)
GFR calc Af Amer: 29 mL/min — ABNORMAL LOW (ref >60)
GFR, EST NON AFRICAN AMERICAN: 25 mL/min — AB (ref >60)
GLUCOSE: 122 mg/dL — AB (ref 65–99)
POTASSIUM: 3.7 mmol/L (ref 3.5–5.1)
Phosphorus: 3.5 mg/dL (ref 2.5–4.6)
SODIUM: 140 mmol/L (ref 135–145)

## 2015-12-23 LAB — IRON AND TIBC
Iron: 40 ug/dL — ABNORMAL LOW (ref 45–182)
Saturation Ratios: 17 % — ABNORMAL LOW (ref 17.9–39.5)
TIBC: 237 ug/dL — ABNORMAL LOW (ref 250–450)
UIBC: 197 ug/dL

## 2015-12-23 LAB — POCT HEMOGLOBIN-HEMACUE: HEMOGLOBIN: 9.9 g/dL — AB (ref 13.0–17.0)

## 2015-12-23 LAB — FERRITIN: FERRITIN: 629 ng/mL — AB (ref 24–336)

## 2015-12-23 MED ORDER — DARBEPOETIN ALFA 200 MCG/0.4ML IJ SOSY
200.0000 ug | PREFILLED_SYRINGE | INTRAMUSCULAR | Status: DC
  Administered 2015-12-23: 200 ug via SUBCUTANEOUS

## 2015-12-23 MED ORDER — DARBEPOETIN ALFA 200 MCG/0.4ML IJ SOSY
PREFILLED_SYRINGE | INTRAMUSCULAR | Status: AC
  Filled 2015-12-23: qty 0.4

## 2016-01-05 ENCOUNTER — Other Ambulatory Visit (HOSPITAL_COMMUNITY): Payer: Self-pay | Admitting: *Deleted

## 2016-01-06 ENCOUNTER — Encounter (HOSPITAL_COMMUNITY)
Admission: RE | Admit: 2016-01-06 | Discharge: 2016-01-06 | Disposition: A | Discharge status: Home / Self Care | Payer: Medicare Other | Source: Ambulatory Visit | Attending: Nephrology | Admitting: Nephrology

## 2016-01-06 DIAGNOSIS — D631 Anemia in chronic kidney disease: Secondary | ICD-10-CM | POA: Diagnosis not present

## 2016-01-06 LAB — POCT HEMOGLOBIN-HEMACUE: HEMOGLOBIN: 9.7 g/dL — AB (ref 13.0–17.0)

## 2016-01-06 MED ORDER — SODIUM CHLORIDE 0.9 % IV SOLN
510.0000 mg | INTRAVENOUS | Status: DC
  Administered 2016-01-06: 510 mg via INTRAVENOUS
  Filled 2016-01-06: qty 17

## 2016-01-06 MED ORDER — DARBEPOETIN ALFA 200 MCG/0.4ML IJ SOSY
PREFILLED_SYRINGE | INTRAMUSCULAR | Status: AC
  Filled 2016-01-06: qty 0.4

## 2016-01-06 MED ORDER — DARBEPOETIN ALFA 200 MCG/0.4ML IJ SOSY
200.0000 ug | PREFILLED_SYRINGE | INTRAMUSCULAR | Status: DC
  Administered 2016-01-06: 200 ug via SUBCUTANEOUS

## 2016-01-13 ENCOUNTER — Encounter (HOSPITAL_COMMUNITY)
Admission: RE | Admit: 2016-01-13 | Discharge: 2016-01-13 | Disposition: A | Discharge status: Home / Self Care | Payer: Medicare Other | Source: Ambulatory Visit | Attending: Nephrology | Admitting: Nephrology

## 2016-01-13 DIAGNOSIS — N183 Chronic kidney disease, stage 3 (moderate): Secondary | ICD-10-CM | POA: Insufficient documentation

## 2016-01-13 DIAGNOSIS — D631 Anemia in chronic kidney disease: Secondary | ICD-10-CM | POA: Diagnosis not present

## 2016-01-13 DIAGNOSIS — D509 Iron deficiency anemia, unspecified: Secondary | ICD-10-CM | POA: Diagnosis not present

## 2016-01-13 DIAGNOSIS — Z5181 Encounter for therapeutic drug level monitoring: Secondary | ICD-10-CM | POA: Diagnosis not present

## 2016-01-13 DIAGNOSIS — Z79899 Other long term (current) drug therapy: Secondary | ICD-10-CM | POA: Diagnosis not present

## 2016-01-13 MED ORDER — SODIUM CHLORIDE 0.9 % IV SOLN
510.0000 mg | INTRAVENOUS | Status: DC
  Administered 2016-01-13: 510 mg via INTRAVENOUS
  Filled 2016-01-13: qty 17

## 2016-01-14 ENCOUNTER — Encounter: Payer: Self-pay | Admitting: Adult Health

## 2016-01-14 ENCOUNTER — Ambulatory Visit (INDEPENDENT_AMBULATORY_CARE_PROVIDER_SITE_OTHER): Payer: Medicare Other | Admitting: Adult Health

## 2016-01-14 VITALS — BP 120/66 | HR 83 | Temp 98.5°F | Ht 73.0 in | Wt 268.0 lb

## 2016-01-14 DIAGNOSIS — J9611 Chronic respiratory failure with hypoxia: Secondary | ICD-10-CM | POA: Diagnosis not present

## 2016-01-14 DIAGNOSIS — J441 Chronic obstructive pulmonary disease with (acute) exacerbation: Secondary | ICD-10-CM | POA: Diagnosis not present

## 2016-01-14 DIAGNOSIS — J9612 Chronic respiratory failure with hypercapnia: Secondary | ICD-10-CM

## 2016-01-14 MED ORDER — AMOXICILLIN-POT CLAVULANATE 875-125 MG PO TABS: 1.0000 | ORAL_TABLET | Freq: Two times a day (BID) | ORAL | Status: AC

## 2016-01-14 MED ORDER — PREDNISONE 10 MG PO TABS: ORAL_TABLET | ORAL | Status: DC

## 2016-01-14 NOTE — Assessment & Plan Note (Signed)
Continue on Trach Collar during daytime  Continue on Vent support At bedtime   Follow up Dr. Chase Caller as planned and As needed .  Please contact office for sooner follow up if symptoms do not improve or worsen or seek emergency care

## 2016-01-14 NOTE — Patient Instructions (Signed)
Augmentin 875mg  Twice daily  For 7 days  Mucinex DM Twice daily  As needed  Cough/congestion  Prednisone taper over next week.  Continue on Trach Collar during daytime  Continue on Vent support At bedtime   Follow up Dr. Chase Caller as planned and As needed .  Please contact office for sooner follow up if symptoms do not improve or worsen or seek emergency care

## 2016-01-14 NOTE — Assessment & Plan Note (Signed)
Exacerbation   Plan  Augmentin 875mg  Twice daily  For 7 days  Mucinex DM Twice daily  As needed  Cough/congestion  Prednisone taper over next week.  Continue on Trach Collar during daytime  Continue on Vent support At bedtime   Follow up Dr. Chase Caller as planned and As needed .  Please contact office for sooner follow up if symptoms do not improve or worsen or seek emergency care

## 2016-01-14 NOTE — Progress Notes (Signed)
Subjective:    Patient ID: [name] [name], male    DOB: 09/01/1950, 65 y.o.   MRN: [id number]  HPI 65 year old male former smoker with COPD, diastolic congestive heart failure, obstructive sleep apnea and chronic respiratory failure with obesity hypoventilation syndrome /Trach dependent on nocturnal vent  and cor pulmonale   01/14/2016 Acute OV  Patient presents for an acute office visit Patient complains of prod cough with thick tan mucus, chest congestion/tightness, SOB and wheezing x 2 weeks ago. Denies any sinus pressure/drainage, fever, nausea or vomiting, hemoptysis.  Appetite is good .  Remains on Vent at night . And trach collar during the daytime Weight is steady . No increased leg swelling .  Denies any chest pain, orthopnea, PND, or increased leg swelling.    Past Medical History  Diagnosis Date  . Coronary atherosclerosis of unspecified type of vessel, native or graft   . Chronic pulmonary heart disease, unspecified   . Chronic respiratory failure ([place])   . Chronic airway obstruction, not elsewhere classified   . Angina   . Anemia   . Anxiety   . Hypertension   . CHF (congestive heart failure) ([place])   . GERD (gastroesophageal reflux disease)   . History of gout   . Chronic diastolic CHF (congestive heart failure) ([place]) 10/28/2007    Hospitalized with diastolic CHF controlled with diuresis and BiPAP 2D April 2012 EF 55-60 with severe LVH and grade 1 diastolic dysfunction    . Type 2 diabetes mellitus with vascular disease ([place]) 07/13/2011    Type 2   . PVD (peripheral vascular disease) ([place]) 07/13/2011    S/P Lt SFA PTA Sept 2009, known occluded Rt SFA   . Swelling of limb     Venous Duplex, 09/03/2012 - no evidence of thrombus or thrombophelitis  . S/P CABG (coronary artery bypass graft) x 6 2000 or 2001    Nuc, 09/17/2012 - Significant ST abnormalities that are suggestive of ischemia, low risk nuclear study  . TIA (transient ischemic attack)     2D Echo, 12/05/2010  - EF 55-60%, severe LVH  . PVD (peripheral vascular disease) ([place])   . COPD (chronic obstructive pulmonary disease) ([place])   . Hyperlipidemia   . Shortness of breath dyspnea     with activity  . Chronic kidney disease     sees dr Louanna Raw every 6 months  . Chronic renal insufficiency, stage III (moderate)   . Unspecified sleep apnea     uses oxygen 4 liter per minute all the time by trach  . Obstructive sleep apnea 10/28/2007  . OSA (obstructive sleep apnea)   . Arthritis     gout    Current Outpatient Prescriptions on File Prior to Visit  Medication Sig Dispense Refill  . acetaminophen (TYLENOL) 500 MG tablet Take 500 mg by mouth every 6 (six) hours as needed for mild pain or moderate pain.    Marland Kitchen allopurinol (ZYLOPRIM) 100 MG tablet Take 1 tablet by mouth daily.    Marland Kitchen arformoterol (BROVANA) 15 MCG/2ML NEBU Take 2 mLs (15 mcg total) by nebulization 2 (two) times daily. 28 mL 0  . aspirin 81 MG tablet Take 81 mg by mouth daily.    . budesonide (PULMICORT) 0.5 MG/2ML nebulizer solution USE ONE VIAL IN NEBULIZER TWICE DAILY 120 mL 3  . clopidogrel (PLAVIX) 75 MG tablet TAKE ONE TABLET BY MOUTH ONCE DAILY (NEED  OFFICE  VISIT) 30 tablet 4  . COLCRYS 0.6 MG tablet Take 0.6 mg  by mouth daily as needed.     Marland Kitchen dextromethorphan-guaiFENesin (MUCINEX DM) 30-600 MG 12hr tablet Take 1 tablet by mouth 2 (two) times daily. Patient takes for mucus relief    . furosemide (LASIX) 40 MG tablet Take 2 tablets in AM, 1 tablet in PM 90 tablet 5  . metolazone (ZAROXOLYN) 2.5 MG tablet TAKE ONE TABLET BY MOUTH ONCE DAILY ON  MONDAY,  WEDNESDAY,  AND  FRIDAY  30  MINUTES  BEFORE  LASIX 30 tablet 3  . nitroGLYCERIN (NITROSTAT) 0.4 MG SL tablet Place 1 tablet (0.4 mg total) under the tongue every 5 (five) minutes as needed. For chest pain 25 tablet 6   No current facility-administered medications on file prior to visit.      Review of Systems  .Constitutional:   No  weight loss, night sweats,  Fevers, chills,  fatigue, or  lassitude.  HEENT:   No headaches,  Difficulty swallowing,  Tooth/dental problems, or  Sore throat,                No sneezing, itching, ear ache, nasal congestion, post nasal drip,   CV:  No chest pain,  Orthopnea, PND, swelling in lower extremities, anasarca, dizziness, palpitations, syncope.   GI  No heartburn, indigestion, abdominal pain, nausea, vomiting, diarrhea, change in bowel habits, loss of appetite, bloody stools.   Resp:   No chest wall deformity  Skin: no rash or lesions.  GU: no dysuria, change in color of urine, no urgency or frequency.  No flank pain, no hematuria   MS:  No joint pain or swelling.  No decreased range of motion.  No back pain.  Psych:  No change in mood or affect. No depression or anxiety.  No memory loss.          Objective:   Physical Exam   Filed Vitals:   01/14/16 1418  BP: 120/66  Pulse: 83  Temp: 98.5 F (36.9 C)  TempSrc: Oral  Height: 6\' 1"  (1.854 m)  Weight: 268 lb (121.564 kg)  SpO2: 98%    GEN: A/Ox3; pleasant , NAD, chronically ill appearing on oxygen  Vitals signs reviewed   HEENT:  [place]/AT,  EACs-clear, TMs-wnl, NOSE-clear, THROAT-clear, no lesions, no postnasal drip or exudate noted.   NECK:  Supple w/ fair ROM; no JVD; normal carotid impulses w/o bruits; no thyromegaly or nodules palpated; no lymphadenopathy.Trach midline , dsg clean and dry .   RESP  Clear  P & A; w/o, wheezes/ rales/ or rhonchi.no accessory muscle use, no dullness to percussion  CARD:  RRR, no m/r/g  , 1+ peripheral edema, pulses intact, no cyanosis or clubbing.  GI:   Soft & nt; nml bowel sounds; no organomegaly or masses detected.  Musco: Warm bil, no deformities or joint swelling noted.   Neuro: alert, no focal deficits noted.    Skin: Warm, no lesions or rashes  Tammy Parrett NP-C  [facility]  01/14/2016

## 2016-01-26 ENCOUNTER — Other Ambulatory Visit (HOSPITAL_COMMUNITY): Payer: Self-pay | Admitting: *Deleted

## 2016-01-27 ENCOUNTER — Encounter (HOSPITAL_COMMUNITY)
Admission: RE | Admit: 2016-01-27 | Discharge: 2016-01-27 | Disposition: A | Discharge status: Home / Self Care | Payer: Medicare Other | Source: Ambulatory Visit | Attending: Nephrology | Admitting: Nephrology

## 2016-01-27 DIAGNOSIS — N183 Chronic kidney disease, stage 3 (moderate): Secondary | ICD-10-CM | POA: Diagnosis not present

## 2016-01-27 LAB — RENAL FUNCTION PANEL
ANION GAP: 12 (ref 5–15)
Albumin: 3.3 g/dL — ABNORMAL LOW (ref 3.5–5.0)
BUN: 80 mg/dL — ABNORMAL HIGH (ref 6–20)
CHLORIDE: 92 mmol/L — AB (ref 101–111)
CO2: 37 mmol/L — AB (ref 22–32)
Calcium: 8.5 mg/dL — ABNORMAL LOW (ref 8.9–10.3)
Creatinine, Ser: 2.17 mg/dL — ABNORMAL HIGH (ref 0.61–1.24)
GFR, EST AFRICAN AMERICAN: 35 mL/min — AB (ref >60)
GFR, EST NON AFRICAN AMERICAN: 31 mL/min — AB (ref >60)
Glucose, Bld: 140 mg/dL — ABNORMAL HIGH (ref 65–99)
POTASSIUM: 3.6 mmol/L (ref 3.5–5.1)
Phosphorus: 4.1 mg/dL (ref 2.5–4.6)
Sodium: 141 mmol/L (ref 135–145)

## 2016-01-27 LAB — POCT HEMOGLOBIN-HEMACUE: Hemoglobin: 10.5 g/dL — ABNORMAL LOW (ref 13.0–17.0)

## 2016-01-27 MED ORDER — DARBEPOETIN ALFA 200 MCG/0.4ML IJ SOSY
200.0000 ug | PREFILLED_SYRINGE | INTRAMUSCULAR | Status: DC
  Administered 2016-01-27: 200 ug via SUBCUTANEOUS

## 2016-01-27 MED ORDER — DARBEPOETIN ALFA 200 MCG/0.4ML IJ SOSY
PREFILLED_SYRINGE | INTRAMUSCULAR | Status: AC
  Filled 2016-01-27: qty 0.4

## 2016-01-30 ENCOUNTER — Other Ambulatory Visit: Payer: Self-pay | Admitting: Cardiovascular Disease

## 2016-01-31 NOTE — Telephone Encounter (Signed)
Rx(s) sent to pharmacy electronically.  

## 2016-02-01 ENCOUNTER — Other Ambulatory Visit: Payer: Self-pay | Admitting: Cardiovascular Disease

## 2016-02-10 ENCOUNTER — Encounter (HOSPITAL_COMMUNITY)
Admission: RE | Admit: 2016-02-10 | Discharge: 2016-02-10 | Disposition: A | Discharge status: Home / Self Care | Payer: Medicare Other | Source: Ambulatory Visit | Attending: Nephrology | Admitting: Nephrology

## 2016-02-10 DIAGNOSIS — N183 Chronic kidney disease, stage 3 (moderate): Secondary | ICD-10-CM | POA: Diagnosis not present

## 2016-02-10 LAB — POCT HEMOGLOBIN-HEMACUE: Hemoglobin: 10.5 g/dL — ABNORMAL LOW (ref 13.0–17.0)

## 2016-02-10 MED ORDER — DARBEPOETIN ALFA 200 MCG/0.4ML IJ SOSY
PREFILLED_SYRINGE | INTRAMUSCULAR | Status: AC
  Filled 2016-02-10: qty 0.4

## 2016-02-10 MED ORDER — DARBEPOETIN ALFA 200 MCG/0.4ML IJ SOSY
200.0000 ug | PREFILLED_SYRINGE | INTRAMUSCULAR | Status: DC
  Administered 2016-02-10: 200 ug via SUBCUTANEOUS

## 2016-02-14 ENCOUNTER — Other Ambulatory Visit: Payer: Self-pay | Admitting: Cardiovascular Disease

## 2016-02-24 ENCOUNTER — Encounter (HOSPITAL_COMMUNITY): Payer: Medicare Other

## 2016-03-02 ENCOUNTER — Encounter (HOSPITAL_COMMUNITY)
Admission: RE | Admit: 2016-03-02 | Discharge: 2016-03-02 | Disposition: A | Discharge status: Home / Self Care | Payer: Medicare Other | Source: Ambulatory Visit | Attending: Nephrology | Admitting: Nephrology

## 2016-03-02 DIAGNOSIS — N183 Chronic kidney disease, stage 3 (moderate): Secondary | ICD-10-CM | POA: Insufficient documentation

## 2016-03-02 DIAGNOSIS — D631 Anemia in chronic kidney disease: Secondary | ICD-10-CM | POA: Diagnosis not present

## 2016-03-02 LAB — IRON AND TIBC
IRON: 48 ug/dL (ref 45–182)
Saturation Ratios: 20 % (ref 17.9–39.5)
TIBC: 235 ug/dL — AB (ref 250–450)
UIBC: 187 ug/dL

## 2016-03-02 LAB — RENAL FUNCTION PANEL
Albumin: 3.3 g/dL — ABNORMAL LOW (ref 3.5–5.0)
Anion gap: 6 (ref 5–15)
BUN: 65 mg/dL — ABNORMAL HIGH (ref 6–20)
CHLORIDE: 87 mmol/L — AB (ref 101–111)
CO2: 43 mmol/L — AB (ref 22–32)
CREATININE: 2.25 mg/dL — AB (ref 0.61–1.24)
Calcium: 8.9 mg/dL (ref 8.9–10.3)
GFR, EST AFRICAN AMERICAN: 34 mL/min — AB (ref >60)
GFR, EST NON AFRICAN AMERICAN: 29 mL/min — AB (ref >60)
Glucose, Bld: 152 mg/dL — ABNORMAL HIGH (ref 65–99)
POTASSIUM: 3.6 mmol/L (ref 3.5–5.1)
Phosphorus: 2.3 mg/dL — ABNORMAL LOW (ref 2.5–4.6)
Sodium: 136 mmol/L (ref 135–145)

## 2016-03-02 LAB — FERRITIN: FERRITIN: 742 ng/mL — AB (ref 24–336)

## 2016-03-02 LAB — POCT HEMOGLOBIN-HEMACUE: HEMOGLOBIN: 9.7 g/dL — AB (ref 13.0–17.0)

## 2016-03-02 MED ORDER — DARBEPOETIN ALFA 200 MCG/0.4ML IJ SOSY
200.0000 ug | PREFILLED_SYRINGE | INTRAMUSCULAR | Status: DC
  Administered 2016-03-02: 200 ug via SUBCUTANEOUS

## 2016-03-02 MED ORDER — DARBEPOETIN ALFA 200 MCG/0.4ML IJ SOSY
PREFILLED_SYRINGE | INTRAMUSCULAR | Status: AC
  Filled 2016-03-02: qty 0.4

## 2016-03-09 ENCOUNTER — Telehealth: Payer: Self-pay | Admitting: Internal Medicine

## 2016-03-09 NOTE — Telephone Encounter (Signed)
Samples left up front. Pt aware. Nothing further needed.

## 2016-03-16 ENCOUNTER — Encounter (HOSPITAL_COMMUNITY)
Admission: RE | Admit: 2016-03-16 | Discharge: 2016-03-16 | Disposition: A | Discharge status: Home / Self Care | Payer: Medicare Other | Source: Ambulatory Visit | Attending: Nephrology | Admitting: Nephrology

## 2016-03-16 DIAGNOSIS — D631 Anemia in chronic kidney disease: Secondary | ICD-10-CM | POA: Diagnosis not present

## 2016-03-16 DIAGNOSIS — N183 Chronic kidney disease, stage 3 unspecified: Secondary | ICD-10-CM

## 2016-03-16 LAB — POCT HEMOGLOBIN-HEMACUE: HEMOGLOBIN: 9.9 g/dL — AB (ref 13.0–17.0)

## 2016-03-16 MED ORDER — DARBEPOETIN ALFA 200 MCG/0.4ML IJ SOSY
PREFILLED_SYRINGE | INTRAMUSCULAR | Status: AC
  Filled 2016-03-16: qty 0.4

## 2016-03-16 MED ORDER — DARBEPOETIN ALFA 200 MCG/0.4ML IJ SOSY
200.0000 ug | PREFILLED_SYRINGE | INTRAMUSCULAR | Status: DC
Start: 2016-03-16 — End: 2016-03-17
  Administered 2016-03-16: 200 ug via SUBCUTANEOUS

## 2016-03-30 ENCOUNTER — Encounter (HOSPITAL_COMMUNITY)
Admission: RE | Admit: 2016-03-30 | Discharge: 2016-03-30 | Disposition: A | Discharge status: Home / Self Care | Payer: Medicare Other | Source: Ambulatory Visit | Attending: Nephrology | Admitting: Nephrology

## 2016-03-30 DIAGNOSIS — N183 Chronic kidney disease, stage 3 unspecified: Secondary | ICD-10-CM

## 2016-03-30 DIAGNOSIS — D631 Anemia in chronic kidney disease: Secondary | ICD-10-CM | POA: Diagnosis not present

## 2016-03-30 LAB — POCT HEMOGLOBIN-HEMACUE: Hemoglobin: 10.1 g/dL — ABNORMAL LOW (ref 13.0–17.0)

## 2016-03-30 LAB — RENAL FUNCTION PANEL
ALBUMIN: 3.7 g/dL (ref 3.5–5.0)
ANION GAP: 8 (ref 5–15)
BUN: 68 mg/dL — AB (ref 6–20)
CALCIUM: 9.1 mg/dL (ref 8.9–10.3)
CO2: 44 mmol/L — ABNORMAL HIGH (ref 22–32)
CREATININE: 2.32 mg/dL — AB (ref 0.61–1.24)
Chloride: 87 mmol/L — ABNORMAL LOW (ref 101–111)
GFR calc Af Amer: 32 mL/min — ABNORMAL LOW (ref >60)
GFR calc non Af Amer: 28 mL/min — ABNORMAL LOW (ref >60)
GLUCOSE: 122 mg/dL — AB (ref 65–99)
PHOSPHORUS: 3.5 mg/dL (ref 2.5–4.6)
Potassium: 3.7 mmol/L (ref 3.5–5.1)
SODIUM: 139 mmol/L (ref 135–145)

## 2016-03-30 MED ORDER — DARBEPOETIN ALFA 200 MCG/0.4ML IJ SOSY
PREFILLED_SYRINGE | INTRAMUSCULAR | Status: AC
  Filled 2016-03-30: qty 0.4

## 2016-03-30 MED ORDER — DARBEPOETIN ALFA 200 MCG/0.4ML IJ SOSY
200.0000 ug | PREFILLED_SYRINGE | INTRAMUSCULAR | Status: DC
  Administered 2016-03-30: 200 ug via SUBCUTANEOUS

## 2016-04-04 LAB — HM DIABETES EYE EXAM

## 2016-04-12 ENCOUNTER — Other Ambulatory Visit (HOSPITAL_COMMUNITY): Payer: Self-pay | Admitting: *Deleted

## 2016-04-13 ENCOUNTER — Encounter (HOSPITAL_COMMUNITY)
Admission: RE | Admit: 2016-04-13 | Discharge: 2016-04-13 | Disposition: A | Discharge status: Home / Self Care | Payer: Medicare Other | Source: Ambulatory Visit | Attending: Nephrology | Admitting: Nephrology

## 2016-04-13 DIAGNOSIS — N183 Chronic kidney disease, stage 3 unspecified: Secondary | ICD-10-CM

## 2016-04-13 DIAGNOSIS — D631 Anemia in chronic kidney disease: Secondary | ICD-10-CM | POA: Diagnosis not present

## 2016-04-13 LAB — POCT HEMOGLOBIN-HEMACUE: Hemoglobin: 10.1 g/dL — ABNORMAL LOW (ref 13.0–17.0)

## 2016-04-13 MED ORDER — DARBEPOETIN ALFA 200 MCG/0.4ML IJ SOSY
200.0000 ug | PREFILLED_SYRINGE | INTRAMUSCULAR | Status: DC
  Administered 2016-04-13: 200 ug via SUBCUTANEOUS

## 2016-04-13 MED ORDER — DARBEPOETIN ALFA 200 MCG/0.4ML IJ SOSY
PREFILLED_SYRINGE | INTRAMUSCULAR | Status: AC
  Filled 2016-04-13: qty 0.4

## 2016-04-27 ENCOUNTER — Encounter (HOSPITAL_COMMUNITY)
Admission: RE | Admit: 2016-04-27 | Discharge: 2016-04-27 | Disposition: A | Discharge status: Home / Self Care | Payer: Medicare Other | Source: Ambulatory Visit | Attending: Nephrology | Admitting: Nephrology

## 2016-04-27 DIAGNOSIS — N183 Chronic kidney disease, stage 3 unspecified: Secondary | ICD-10-CM

## 2016-04-27 DIAGNOSIS — D631 Anemia in chronic kidney disease: Secondary | ICD-10-CM | POA: Insufficient documentation

## 2016-04-27 LAB — FERRITIN: FERRITIN: 494 ng/mL — AB (ref 24–336)

## 2016-04-27 LAB — IRON AND TIBC
IRON: 38 ug/dL — AB (ref 45–182)
Saturation Ratios: 15 % — ABNORMAL LOW (ref 17.9–39.5)
TIBC: 246 ug/dL — ABNORMAL LOW (ref 250–450)
UIBC: 208 ug/dL

## 2016-04-27 LAB — RENAL FUNCTION PANEL
ANION GAP: 9 (ref 5–15)
Albumin: 3.4 g/dL — ABNORMAL LOW (ref 3.5–5.0)
BUN: 71 mg/dL — ABNORMAL HIGH (ref 6–20)
CALCIUM: 8.7 mg/dL — AB (ref 8.9–10.3)
CHLORIDE: 85 mmol/L — AB (ref 101–111)
CO2: 43 mmol/L — ABNORMAL HIGH (ref 22–32)
CREATININE: 3.01 mg/dL — AB (ref 0.61–1.24)
GFR, EST AFRICAN AMERICAN: 24 mL/min — AB (ref >60)
GFR, EST NON AFRICAN AMERICAN: 20 mL/min — AB (ref >60)
Glucose, Bld: 168 mg/dL — ABNORMAL HIGH (ref 65–99)
Phosphorus: 3.7 mg/dL (ref 2.5–4.6)
Potassium: 3.7 mmol/L (ref 3.5–5.1)
SODIUM: 137 mmol/L (ref 135–145)

## 2016-04-27 LAB — POCT HEMOGLOBIN-HEMACUE: HEMOGLOBIN: 10.1 g/dL — AB (ref 13.0–17.0)

## 2016-04-27 MED ORDER — DARBEPOETIN ALFA 200 MCG/0.4ML IJ SOSY
200.0000 ug | PREFILLED_SYRINGE | INTRAMUSCULAR | Status: DC
  Administered 2016-04-27: 200 ug via SUBCUTANEOUS

## 2016-04-27 MED ORDER — DARBEPOETIN ALFA 200 MCG/0.4ML IJ SOSY
PREFILLED_SYRINGE | INTRAMUSCULAR | Status: AC
  Filled 2016-04-27: qty 0.4

## 2016-05-05 ENCOUNTER — Telehealth: Payer: Self-pay | Admitting: Internal Medicine

## 2016-05-05 MED ORDER — ARFORMOTEROL TARTRATE 15 MCG/2ML IN NEBU: 15.0000 ug | INHALATION_SOLUTION | Freq: Two times a day (BID) | RESPIRATORY_TRACT | 0 refills | Status: DC

## 2016-05-05 NOTE — Telephone Encounter (Signed)
Spoke with pt, samples placed up front for pickup.  Nothing further needed.

## 2016-05-11 ENCOUNTER — Encounter (HOSPITAL_COMMUNITY): Payer: Medicare Other

## 2016-05-24 ENCOUNTER — Other Ambulatory Visit (HOSPITAL_COMMUNITY): Payer: Self-pay | Admitting: *Deleted

## 2016-05-24 ENCOUNTER — Ambulatory Visit (INDEPENDENT_AMBULATORY_CARE_PROVIDER_SITE_OTHER): Payer: Medicare Other | Admitting: Adult Health

## 2016-05-24 ENCOUNTER — Encounter: Payer: Self-pay | Admitting: Adult Health

## 2016-05-24 VITALS — BP 132/82 | HR 91 | Temp 98.5°F | Ht 73.0 in | Wt 261.0 lb

## 2016-05-24 DIAGNOSIS — J9611 Chronic respiratory failure with hypoxia: Secondary | ICD-10-CM

## 2016-05-24 DIAGNOSIS — J449 Chronic obstructive pulmonary disease, unspecified: Secondary | ICD-10-CM | POA: Diagnosis not present

## 2016-05-24 MED ORDER — PREDNISONE 10 MG PO TABS: ORAL_TABLET | ORAL | 0 refills | Status: DC

## 2016-05-24 MED ORDER — BUDESONIDE 0.5 MG/2ML IN SUSP: RESPIRATORY_TRACT | 5 refills | Status: AC

## 2016-05-24 MED ORDER — DOXYCYCLINE HYCLATE 100 MG PO TABS: 100.0000 mg | ORAL_TABLET | Freq: Two times a day (BID) | ORAL | 0 refills | Status: DC

## 2016-05-24 MED ORDER — ARFORMOTEROL TARTRATE 15 MCG/2ML IN NEBU: 15.0000 ug | INHALATION_SOLUTION | Freq: Two times a day (BID) | RESPIRATORY_TRACT | 5 refills | Status: AC

## 2016-05-24 NOTE — Assessment & Plan Note (Signed)
Patient Instructions  Doxycycline 100mg  Twice daily  For 7 days . Take w/ food and eat yogurt.  Mucinex DM Twice daily  As needed  Cough/congestion  Prednisone taper over next week.  Continue on Trach Collar during daytime  Continue on Vent support At bedtime   Follow up Dr. Chase Caller in 2 months  and As needed .  Please contact office for sooner follow up if symptoms do not improve or worsen or seek emergency care

## 2016-05-24 NOTE — Patient Instructions (Addendum)
Doxycycline 100mg  Twice daily  For 7 days . Take w/ food and eat yogurt.  Mucinex DM Twice daily  As needed  Cough/congestion  Prednisone taper over next week.  Continue on Trach Collar during daytime  Continue on Vent support At bedtime   Follow up Dr. Chase Caller in 2 months  and As needed .  Please contact office for sooner follow up if symptoms do not improve or worsen or seek emergency care

## 2016-05-24 NOTE — Assessment & Plan Note (Signed)
[place]   Plan  Patient Instructions  Doxycycline 100mg  Twice daily  For 7 days . Take w/ food and eat yogurt.  Mucinex DM Twice daily  As needed  Cough/congestion  Prednisone taper over next week.  Continue on Trach Collar during daytime  Continue on Vent support At bedtime   Follow up Dr. Chase Caller in 2 months  and As needed .  Please contact office for sooner follow up if symptoms do not improve or worsen or seek emergency care

## 2016-05-24 NOTE — Progress Notes (Signed)
Subjective:    Patient ID: [name] [name], male    DOB: February 01, 1951, 65 y.o.   MRN: [id number]  HPI 65 year old male former smoker with COPD, diastolic congestive heart failure, obstructive sleep apnea and chronic respiratory failure with obesity hypoventilation syndrome /Trach dependent on nocturnal vent  and cor pulmonale   05/24/2016 Acute OV  Patient presents for an acute office visit Patient complains of prod cough with thick yellow mucus, chest congestion/tightness, SOB and wheezing x 2 -3weeks ago. Denies any sinus pressure/drainage, fever, nausea or vomiting, hemoptysis.  Appetite is good .  Remains on Vent at night . And trach collar during the daytime Weight is steady . Has decreased leg swelling . No orthopnea .  Denies any chest pain, orthopnea, PND, or  Hemoptysis .    Past Medical History:  Diagnosis Date  . Anemia   . Angina   . Anxiety   . Arthritis    gout  . CHF (congestive heart failure) ([place])   . Chronic airway obstruction, not elsewhere classified   . Chronic diastolic CHF (congestive heart failure) ([place]) 10/28/2007   Hospitalized with diastolic CHF controlled with diuresis and BiPAP 2D April 2012 EF 55-60 with severe LVH and grade 1 diastolic dysfunction    . Chronic kidney disease    sees dr Louanna Raw every 6 months  . Chronic pulmonary heart disease, unspecified   . Chronic renal insufficiency, stage III (moderate)   . Chronic respiratory failure ([place])   . COPD (chronic obstructive pulmonary disease) ([place])   . Coronary atherosclerosis of unspecified type of vessel, native or graft   . GERD (gastroesophageal reflux disease)   . History of gout   . Hyperlipidemia   . Hypertension   . Obstructive sleep apnea 10/28/2007  . OSA (obstructive sleep apnea)   . PVD (peripheral vascular disease) ([place]) 07/13/2011   S/P Lt SFA PTA Sept 2009, known occluded Rt SFA   . PVD (peripheral vascular disease) ([place])   . S/P CABG (coronary artery bypass graft) x 6 2000  or 2001   Nuc, 09/17/2012 - Significant ST abnormalities that are suggestive of ischemia, low risk nuclear study  . Shortness of breath dyspnea    with activity  . Swelling of limb    Venous Duplex, 09/03/2012 - no evidence of thrombus or thrombophelitis  . TIA (transient ischemic attack)    2D Echo, 12/05/2010 - EF 55-60%, severe LVH  . Type 2 diabetes mellitus with vascular disease ([place]) 07/13/2011   Type 2   . Unspecified sleep apnea    uses oxygen 4 liter per minute all the time by trach    Current Outpatient Prescriptions on File Prior to Visit  Medication Sig Dispense Refill  . acetaminophen (TYLENOL) 500 MG tablet Take 500 mg by mouth every 6 (six) hours as needed for mild pain or moderate pain.    Marland Kitchen allopurinol (ZYLOPRIM) 100 MG tablet Take 1 tablet by mouth daily.    Marland Kitchen aspirin 81 MG tablet Take 81 mg by mouth daily.    . clopidogrel (PLAVIX) 75 MG tablet Take 1 tablet (75 mg total) by mouth daily. 30 tablet 6  . COLCRYS 0.6 MG tablet Take 0.6 mg by mouth daily as needed.     Marland Kitchen dextromethorphan-guaiFENesin (MUCINEX DM) 30-600 MG 12hr tablet Take 1 tablet by mouth 2 (two) times daily. Patient takes for mucus relief    . furosemide (LASIX) 40 MG tablet TAKE TWO TABLETS BY MOUTH IN THE MORNING AND ONE  TABLET IN THE EVENING 90 tablet 9  . metolazone (ZAROXOLYN) 2.5 MG tablet TAKE ONE TABLET BY MOUTH ONCE DAILY ON  MONDAY,  WEDNESDAY,  AND  FRIDAY  30  MINUTES  BEFORE  LASIX 30 tablet 3  . nitroGLYCERIN (NITROSTAT) 0.4 MG SL tablet Place 1 tablet (0.4 mg total) under the tongue every 5 (five) minutes as needed. For chest pain 25 tablet 6   No current facility-administered medications on file prior to visit.       Review of Systems  .Constitutional:   No  weight loss, night sweats,  Fevers, chills, fatigue, or  lassitude.  HEENT:   No headaches,  Difficulty swallowing,  Tooth/dental problems, or  Sore throat,                No sneezing, itching, ear ache,  +nasal congestion, post  nasal drip,   CV:  No chest pain,  Orthopnea, PND, swelling in lower extremities, anasarca, dizziness, palpitations, syncope.   GI  No heartburn, indigestion, abdominal pain, nausea, vomiting, diarrhea, change in bowel habits, loss of appetite, bloody stools.   Resp:   No chest wall deformity  Skin: no rash or lesions.  GU: no dysuria, change in color of urine, no urgency or frequency.  No flank pain, no hematuria   MS:  No joint pain or swelling.  No decreased range of motion.  No back pain.  Psych:  No change in mood or affect. No depression or anxiety.  No memory loss.          Objective:   Physical Exam   Vitals:   05/24/16 1216  BP: 132/82  Pulse: 91  Temp: 98.5 F (36.9 C)  TempSrc: Oral  SpO2: 100%  Weight: 261 lb (118.4 kg)  Height: 6\' 1"  (1.854 m)    GEN: A/Ox3; pleasant , NAD, chronically ill appearing on oxygen trach collar  Vitals signs reviewed    HEENT:  [place]/AT,  EACs-clear, TMs-wnl, NOSE-clear, THROAT-clear, no lesions, no postnasal drip or exudate noted.   NECK:  Supple w/ fair ROM; no JVD; normal carotid impulses w/o bruits; no thyromegaly or nodules palpated; no lymphadenopathy.  Trach midline , dsg clean and dry .   RESP  Clear  P & A; w/o, wheezes/ rales/ or rhonchi. no accessory muscle use, no dullness to percussion  CARD:  RRR, no m/r/g  , tr+ peripheral edema, pulses intact, no cyanosis or clubbing.  GI:   Soft & nt; nml bowel sounds; no organomegaly or masses detected.   Musco: Warm bil, no deformities or joint swelling noted.   Neuro: alert, no focal deficits noted.    Skin: Warm, no lesions or rashes  Tammy Parrett NP-C  [facility]  05/24/2016

## 2016-05-25 ENCOUNTER — Encounter (HOSPITAL_COMMUNITY)
Admission: RE | Admit: 2016-05-25 | Discharge: 2016-05-25 | Disposition: A | Discharge status: Home / Self Care | Payer: Medicare Other | Source: Ambulatory Visit | Attending: Nephrology | Admitting: Nephrology

## 2016-05-25 DIAGNOSIS — N183 Chronic kidney disease, stage 3 unspecified: Secondary | ICD-10-CM

## 2016-05-25 DIAGNOSIS — D509 Iron deficiency anemia, unspecified: Secondary | ICD-10-CM | POA: Diagnosis present

## 2016-05-25 LAB — RENAL FUNCTION PANEL
ALBUMIN: 3.7 g/dL (ref 3.5–5.0)
ANION GAP: 10 (ref 5–15)
BUN: 66 mg/dL — ABNORMAL HIGH (ref 6–20)
CALCIUM: 8.9 mg/dL (ref 8.9–10.3)
CO2: 42 mmol/L — ABNORMAL HIGH (ref 22–32)
Chloride: 88 mmol/L — ABNORMAL LOW (ref 101–111)
Creatinine, Ser: 2.17 mg/dL — ABNORMAL HIGH (ref 0.61–1.24)
GFR calc Af Amer: 35 mL/min — ABNORMAL LOW (ref >60)
GFR, EST NON AFRICAN AMERICAN: 30 mL/min — AB (ref >60)
Glucose, Bld: 139 mg/dL — ABNORMAL HIGH (ref 65–99)
PHOSPHORUS: 3 mg/dL (ref 2.5–4.6)
POTASSIUM: 4 mmol/L (ref 3.5–5.1)
SODIUM: 140 mmol/L (ref 135–145)

## 2016-05-25 LAB — POCT HEMOGLOBIN-HEMACUE: HEMOGLOBIN: 9.6 g/dL — AB (ref 13.0–17.0)

## 2016-05-25 MED ORDER — SODIUM CHLORIDE 0.9 % IV SOLN
510.0000 mg | INTRAVENOUS | Status: DC
  Administered 2016-05-25: 510 mg via INTRAVENOUS
  Filled 2016-05-25: qty 17

## 2016-05-25 MED ORDER — DARBEPOETIN ALFA 200 MCG/0.4ML IJ SOSY
200.0000 ug | PREFILLED_SYRINGE | INTRAMUSCULAR | Status: DC
  Administered 2016-05-25: 200 ug via SUBCUTANEOUS

## 2016-05-25 MED ORDER — DARBEPOETIN ALFA 200 MCG/0.4ML IJ SOSY
PREFILLED_SYRINGE | INTRAMUSCULAR | Status: AC
  Administered 2016-05-25: 200 ug via SUBCUTANEOUS
  Filled 2016-05-25: qty 0.4

## 2016-05-31 ENCOUNTER — Other Ambulatory Visit (HOSPITAL_COMMUNITY): Payer: Self-pay | Admitting: *Deleted

## 2016-06-01 ENCOUNTER — Encounter (HOSPITAL_COMMUNITY)
Admission: RE | Admit: 2016-06-01 | Discharge: 2016-06-01 | Disposition: A | Discharge status: Home / Self Care | Payer: Medicare Other | Source: Ambulatory Visit | Attending: Nephrology | Admitting: Nephrology

## 2016-06-01 DIAGNOSIS — D590 Drug-induced autoimmune hemolytic anemia: Secondary | ICD-10-CM | POA: Diagnosis not present

## 2016-06-01 MED ORDER — SODIUM CHLORIDE 0.9 % IV SOLN
510.0000 mg | INTRAVENOUS | Status: AC
  Administered 2016-06-01: 510 mg via INTRAVENOUS
  Filled 2016-06-01: qty 17

## 2016-06-08 ENCOUNTER — Encounter (HOSPITAL_COMMUNITY)
Admission: RE | Admit: 2016-06-08 | Discharge: 2016-06-08 | Disposition: A | Discharge status: Home / Self Care | Payer: Medicare Other | Source: Ambulatory Visit | Attending: Nephrology | Admitting: Nephrology

## 2016-06-08 DIAGNOSIS — D509 Iron deficiency anemia, unspecified: Secondary | ICD-10-CM | POA: Diagnosis not present

## 2016-06-08 DIAGNOSIS — N183 Chronic kidney disease, stage 3 unspecified: Secondary | ICD-10-CM

## 2016-06-08 LAB — POCT HEMOGLOBIN-HEMACUE: Hemoglobin: 10.1 g/dL — ABNORMAL LOW (ref 13.0–17.0)

## 2016-06-08 MED ORDER — DARBEPOETIN ALFA 200 MCG/0.4ML IJ SOSY
PREFILLED_SYRINGE | INTRAMUSCULAR | Status: AC
  Filled 2016-06-08: qty 0.4

## 2016-06-08 MED ORDER — DARBEPOETIN ALFA 200 MCG/0.4ML IJ SOSY
200.0000 ug | PREFILLED_SYRINGE | INTRAMUSCULAR | Status: DC
  Administered 2016-06-08: 200 ug via SUBCUTANEOUS

## 2016-06-22 ENCOUNTER — Encounter (HOSPITAL_COMMUNITY)
Admission: RE | Admit: 2016-06-22 | Discharge: 2016-06-22 | Disposition: A | Discharge status: Home / Self Care | Payer: Medicare Other | Source: Ambulatory Visit | Attending: Nephrology | Admitting: Nephrology

## 2016-06-22 DIAGNOSIS — D631 Anemia in chronic kidney disease: Secondary | ICD-10-CM | POA: Insufficient documentation

## 2016-06-22 DIAGNOSIS — N183 Chronic kidney disease, stage 3 unspecified: Secondary | ICD-10-CM

## 2016-06-22 LAB — RENAL FUNCTION PANEL
ANION GAP: 15 (ref 5–15)
Albumin: 3.3 g/dL — ABNORMAL LOW (ref 3.5–5.0)
BUN: 64 mg/dL — AB (ref 6–20)
CALCIUM: 9 mg/dL (ref 8.9–10.3)
CO2: 39 mmol/L — AB (ref 22–32)
Chloride: 87 mmol/L — ABNORMAL LOW (ref 101–111)
Creatinine, Ser: 2.13 mg/dL — ABNORMAL HIGH (ref 0.61–1.24)
GFR calc Af Amer: 36 mL/min — ABNORMAL LOW (ref >60)
GFR calc non Af Amer: 31 mL/min — ABNORMAL LOW (ref >60)
GLUCOSE: 177 mg/dL — AB (ref 65–99)
POTASSIUM: 3.6 mmol/L (ref 3.5–5.1)
Phosphorus: 2.2 mg/dL — ABNORMAL LOW (ref 2.5–4.6)
SODIUM: 141 mmol/L (ref 135–145)

## 2016-06-22 LAB — IRON AND TIBC
Iron: 45 ug/dL (ref 45–182)
SATURATION RATIOS: 21 % (ref 17.9–39.5)
TIBC: 211 ug/dL — AB (ref 250–450)
UIBC: 166 ug/dL

## 2016-06-22 LAB — POCT HEMOGLOBIN-HEMACUE: Hemoglobin: 10 g/dL — ABNORMAL LOW (ref 13.0–17.0)

## 2016-06-22 LAB — FERRITIN: Ferritin: 861 ng/mL — ABNORMAL HIGH (ref 24–336)

## 2016-06-22 MED ORDER — DARBEPOETIN ALFA 200 MCG/0.4ML IJ SOSY
200.0000 ug | PREFILLED_SYRINGE | INTRAMUSCULAR | Status: DC
  Administered 2016-06-22: 200 ug via SUBCUTANEOUS

## 2016-06-22 MED ORDER — DARBEPOETIN ALFA 200 MCG/0.4ML IJ SOSY
PREFILLED_SYRINGE | INTRAMUSCULAR | Status: AC
  Filled 2016-06-22: qty 0.4

## 2016-07-04 ENCOUNTER — Other Ambulatory Visit (HOSPITAL_COMMUNITY): Payer: Self-pay | Admitting: *Deleted

## 2016-07-05 ENCOUNTER — Encounter (HOSPITAL_COMMUNITY): Payer: Medicare Other

## 2016-07-11 ENCOUNTER — Other Ambulatory Visit (HOSPITAL_COMMUNITY): Payer: Self-pay | Admitting: *Deleted

## 2016-07-12 ENCOUNTER — Inpatient Hospital Stay (HOSPITAL_COMMUNITY): Admission: RE | Admit: 2016-07-12 | Payer: Medicare Other | Source: Ambulatory Visit

## 2016-07-14 ENCOUNTER — Encounter (HOSPITAL_COMMUNITY)
Admission: RE | Admit: 2016-07-14 | Discharge: 2016-07-14 | Disposition: A | Discharge status: Home / Self Care | Payer: Medicare Other | Source: Ambulatory Visit | Attending: Nephrology | Admitting: Nephrology

## 2016-07-14 DIAGNOSIS — N183 Chronic kidney disease, stage 3 unspecified: Secondary | ICD-10-CM

## 2016-07-14 DIAGNOSIS — D631 Anemia in chronic kidney disease: Secondary | ICD-10-CM | POA: Diagnosis present

## 2016-07-14 LAB — POCT HEMOGLOBIN-HEMACUE: Hemoglobin: 10.9 g/dL — ABNORMAL LOW (ref 13.0–17.0)

## 2016-07-14 MED ORDER — DARBEPOETIN ALFA 200 MCG/0.4ML IJ SOSY
200.0000 ug | PREFILLED_SYRINGE | INTRAMUSCULAR | Status: DC
Start: 2016-07-14 — End: 2016-07-15
  Administered 2016-07-14: 200 ug via SUBCUTANEOUS

## 2016-07-14 MED ORDER — DARBEPOETIN ALFA 200 MCG/0.4ML IJ SOSY
PREFILLED_SYRINGE | INTRAMUSCULAR | Status: AC
  Filled 2016-07-14: qty 0.4

## 2016-07-19 ENCOUNTER — Ambulatory Visit: Payer: Medicare Other | Admitting: Internal Medicine

## 2016-07-28 ENCOUNTER — Encounter (HOSPITAL_COMMUNITY)
Admission: RE | Admit: 2016-07-28 | Discharge: 2016-07-28 | Disposition: A | Discharge status: Home / Self Care | Payer: Medicare Other | Source: Ambulatory Visit | Attending: Nephrology | Admitting: Nephrology

## 2016-07-28 DIAGNOSIS — D631 Anemia in chronic kidney disease: Secondary | ICD-10-CM | POA: Diagnosis not present

## 2016-07-28 DIAGNOSIS — N183 Chronic kidney disease, stage 3 unspecified: Secondary | ICD-10-CM

## 2016-07-28 LAB — RENAL FUNCTION PANEL
ALBUMIN: 3.7 g/dL (ref 3.5–5.0)
Anion gap: 11 (ref 5–15)
BUN: 71 mg/dL — ABNORMAL HIGH (ref 6–20)
CHLORIDE: 89 mmol/L — AB (ref 101–111)
CO2: 41 mmol/L — ABNORMAL HIGH (ref 22–32)
CREATININE: 2.26 mg/dL — AB (ref 0.61–1.24)
Calcium: 9 mg/dL (ref 8.9–10.3)
GFR calc non Af Amer: 29 mL/min — ABNORMAL LOW (ref >60)
GFR, EST AFRICAN AMERICAN: 34 mL/min — AB (ref >60)
Glucose, Bld: 122 mg/dL — ABNORMAL HIGH (ref 65–99)
PHOSPHORUS: 3.6 mg/dL (ref 2.5–4.6)
POTASSIUM: 3.6 mmol/L (ref 3.5–5.1)
Sodium: 141 mmol/L (ref 135–145)

## 2016-07-28 LAB — POCT HEMOGLOBIN-HEMACUE: HEMOGLOBIN: 10.3 g/dL — AB (ref 13.0–17.0)

## 2016-07-28 MED ORDER — DARBEPOETIN ALFA 200 MCG/0.4ML IJ SOSY
PREFILLED_SYRINGE | INTRAMUSCULAR | Status: AC
  Filled 2016-07-28: qty 0.4

## 2016-07-28 MED ORDER — DARBEPOETIN ALFA 200 MCG/0.4ML IJ SOSY
200.0000 ug | PREFILLED_SYRINGE | INTRAMUSCULAR | Status: DC
  Administered 2016-07-28: 200 ug via SUBCUTANEOUS

## 2016-08-09 ENCOUNTER — Ambulatory Visit (INDEPENDENT_AMBULATORY_CARE_PROVIDER_SITE_OTHER)
Admission: RE | Admit: 2016-08-09 | Discharge: 2016-08-09 | Disposition: A | Discharge status: Home / Self Care | Payer: Medicare Other | Source: Ambulatory Visit | Attending: Internal Medicine | Admitting: Internal Medicine

## 2016-08-09 ENCOUNTER — Encounter: Payer: Self-pay | Admitting: Internal Medicine

## 2016-08-09 ENCOUNTER — Telehealth: Payer: Self-pay | Admitting: Internal Medicine

## 2016-08-09 ENCOUNTER — Ambulatory Visit (INDEPENDENT_AMBULATORY_CARE_PROVIDER_SITE_OTHER): Payer: Medicare Other | Admitting: Internal Medicine

## 2016-08-09 VITALS — BP 90/60 | HR 109 | Temp 98.5°F | Ht 73.0 in | Wt 248.0 lb

## 2016-08-09 DIAGNOSIS — J9612 Chronic respiratory failure with hypercapnia: Secondary | ICD-10-CM | POA: Diagnosis not present

## 2016-08-09 DIAGNOSIS — J441 Chronic obstructive pulmonary disease with (acute) exacerbation: Secondary | ICD-10-CM

## 2016-08-09 DIAGNOSIS — J9611 Chronic respiratory failure with hypoxia: Secondary | ICD-10-CM

## 2016-08-09 MED ORDER — ALBUTEROL SULFATE (2.5 MG/3ML) 0.083% IN NEBU: 2.5000 mg | INHALATION_SOLUTION | RESPIRATORY_TRACT | Status: AC | PRN

## 2016-08-09 MED ORDER — DOXYCYCLINE HYCLATE 100 MG PO TABS: 100.0000 mg | ORAL_TABLET | Freq: Two times a day (BID) | ORAL | 0 refills | Status: DC

## 2016-08-09 MED ORDER — PREDNISONE 10 MG PO TABS: ORAL_TABLET | ORAL | 0 refills | Status: DC

## 2016-08-09 NOTE — Telephone Encounter (Signed)
Spoke with pt who c/o increased sob & prod cough with light green mucus X 1w. Pt currently on 4L O2, pt states his O2 level is dropping between 89-92 on 4L. Pt has increased liter flow to 5L, O2 level is 85. Pt was checking his O2 level while on the phone with me. O2 was reading 85. I asked pt to make sure his heads were warm. Pt rubbed his hands together to warm his hands up and then rechecked O2, which was still at 85.  I have scheduled pt to come in for acute visit with MW at 11:30 Pt voiced understanding and had no further questions. Nothing further needed.

## 2016-08-09 NOTE — Progress Notes (Signed)
Subjective:    Patient ID: [name] [name], male    DOB: [birth year], 65 y.o.   MRN: [id number]  HPI 76 yobm  Quit smoking 08/1998  with COPD, diastolic congestive heart failure, obstructive sleep apnea and chronic respiratory failure with obesity hypoventilation syndrome /Trach dependent on nocturnal vent  and cor pulmonale   05/24/2016 NP Acute OV  Patient presents for an acute office visit Patient complains of prod cough with thick yellow mucus, chest congestion/tightness, SOB and wheezing x 2 -3weeks ago. Denies any sinus pressure/drainage, fever, nausea or vomiting, hemoptysis.  Appetite is good .  Remains on Vent at night . And trach collar during the daytime Weight is steady . Has decreased leg swelling . No orthopnea .  Denies any chest pain, orthopnea, PND, or  Hemoptysis .  rec Doxycycline 100mg  Twice daily  For 7 days . Take w/ food and eat yogurt.  Mucinex DM Twice daily  As needed  Cough/congestion  Prednisone taper over next week.  Continue on Trach Collar during daytime  Continue on Vent support At bedtime      08/09/2016  Acute  ov/Alicia Seib [id number]  worse for 4 days / chronic resp failure and noct vent dep  Chief Complaint  Patient presents with  . Acute Visit    Pt c/o increased cough for the past few days. He is coughing up large amounts of pale yellow sputum.  He gets tired very easily.     Sleeping on vent and 4lpm per t collar daytime  Still does ok on vent at night despite flare of cough  Confused what he's using in neb rx / alb not listed but has it "can use it once a day if I have to" and not clear he understands how to use it prn up to every 4 hours   No obvious day to day or daytime variability or assoc   mucus plugs or hemoptysis or cp or chest tightness, subjective wheeze or overt sinus or hb symptoms. No unusual exp hx or h/o childhood pna/ asthma or knowledge of premature birth.  Sleeping ok without nocturnal  or early am exacerbation  of respiratory   c/o's or need for noct saba. Also denies any obvious fluctuation of symptoms with weather or environmental changes or other aggravating or alleviating factors except as outlined above   Current Medications, Allergies, Complete Past Medical History, Past Surgical History, Family History, and Social History were reviewed in [employer] record.  ROS  The following are not active complaints unless bolded sore throat, dysphagia, dental problems, itching, sneezing,  nasal congestion or excess/ purulent secretions, ear ache,   fever, chills, sweats, unintended wt loss, classically pleuritic or exertional cp,  orthopnea pnd or leg swelling, presyncope, palpitations, abdominal pain, anorexia, nausea, vomiting, diarrhea  or change in bowel or bladder habits, change in stools or urine, dysuria,hematuria,  rash, arthralgias, visual complaints, headache, numbness, weakness or ataxia or problems with walking or coordination,  change in mood/affect or memory.                 Objective:   Physical Exam      GEN: A/Ox3; pleasant , NAD, chronically ill appearing on oxygen trach collar in w/c  Vitals signs reviewed  - - Note on arrival 02 sats  92% on 4lpm     HEENT:  [place]/AT,  EACs-clear, TMs-wnl, NOSE-clear, THROAT-clear, no lesions, no postnasal drip or exudate noted.   NECK:  Supple w/ fair  ROM; no JVD; normal carotid impulses w/o bruits; no thyromegaly or nodules palpated; no lymphadenopathy.  Trach midline , dsg clean and dry .   RESP   Min insp and exp rhonchi bilaterally - no accessory muscle use, no dullness to percussion  CARD:  RRR, no m/r/g  , tr+ peripheral edema, pulses intact, no cyanosis or clubbing.  GI:   Soft & nt; nml bowel sounds; no organomegaly or masses detected.   Musco: Warm bil, no deformities or joint swelling noted.   Neuro: alert, no focal deficits noted.    Skin: Warm, no lesions or rashes     CXR PA and Lateral:   08/09/2016 :    I personally  reviewed images and agree with radiology impression as follows:   1. Tracheostomy tube in stable position.  2. Prior CABG. Cardiomegaly. Chronic interstitial prominence noted most likely related chronic interstitial lung disease. Superimposed acute interstitial edema or pneumonitis cannot be excluded.  3. Bilateral pleural thickening with calcification, changes most likely related chronic inflammatory changes and scarring. Prior asbestos exposure cannot be excluded . Findings stable from prior exams.

## 2016-08-09 NOTE — Patient Instructions (Addendum)
Plan A = Automatic =budesonide /brovana together in nebulizer every  12 hours   Plan B = Backup Only use your albuterol nebulizer a rescue medication to be used if you can't catch your breath by resting or doing a relaxed purse lip breathing pattern.  - The less you use it, the better it will work when you need it.  Doxycyline 100 mg twice daily with water   Prednisone 10 mg take  4 each am x 2 days,   2 each am x 2 days,  1 each am x 2 days and stop

## 2016-08-09 NOTE — Progress Notes (Signed)
Spoke with pt and notified of results per Dr. Wert. Pt verbalized understanding and denied any questions. 

## 2016-08-11 ENCOUNTER — Encounter (HOSPITAL_COMMUNITY)
Admission: RE | Admit: 2016-08-11 | Discharge: 2016-08-11 | Disposition: A | Discharge status: Home / Self Care | Payer: Medicare Other | Source: Ambulatory Visit | Attending: Nephrology | Admitting: Nephrology

## 2016-08-11 DIAGNOSIS — N183 Chronic kidney disease, stage 3 unspecified: Secondary | ICD-10-CM

## 2016-08-11 DIAGNOSIS — D631 Anemia in chronic kidney disease: Secondary | ICD-10-CM | POA: Diagnosis not present

## 2016-08-11 LAB — POCT HEMOGLOBIN-HEMACUE: HEMOGLOBIN: 10.9 g/dL — AB (ref 13.0–17.0)

## 2016-08-11 MED ORDER — DARBEPOETIN ALFA 200 MCG/0.4ML IJ SOSY
200.0000 ug | PREFILLED_SYRINGE | INTRAMUSCULAR | Status: DC
  Administered 2016-08-11: 200 ug via SUBCUTANEOUS

## 2016-08-11 MED ORDER — DARBEPOETIN ALFA 200 MCG/0.4ML IJ SOSY
PREFILLED_SYRINGE | INTRAMUSCULAR | Status: AC
  Administered 2016-08-11: 200 ug via SUBCUTANEOUS
  Filled 2016-08-11: qty 0.4

## 2016-08-13 ENCOUNTER — Encounter: Payer: Self-pay | Admitting: Internal Medicine

## 2016-08-13 NOTE — Assessment & Plan Note (Addendum)
Vent dep nocturnally only  - HC03  06/22/16  = 39   Seems to be well compensated daytime with T collar but note marked elevation in serial HC03 levels indicating he still has significant chronic resp acidosis - this is not necessarily a problem as long as wob and mental status remain baseline as the high buffering capacity helps reduce the ventilatory demand when not on vent > ok to continue vent settings/ daytime rx with T collar as they are/ reviewed with his nurse

## 2016-08-13 NOTE — Assessment & Plan Note (Signed)
Spirometry 02/24/10  FEV1 1.11 (31%)  Ratio 74 with only mild/mod curvature-   No change p saba  Flare 05/24/16 and 08/09/2016   He probably only has mild to moderate copd with restrictive changes from previous ALI though radiology raises issue of asbestos exposure need to explore at next ov   For now best rx is treat as aecopd with doxy/prednisone as helped at last flare   I had an extended discussion with the patient reviewing all relevant studies completed to date and  lasting 25 minutes of a 40  minute acute  visit in pt not previously known to me  re  non-specific but potentially very serious pulmonary symptoms of unknown etiology.  Each maintenance medication was reviewed in detail including most importantly the difference between maintenance and prns and under what circumstances the prns are to be triggered using an action plan format that is not reflected in the computer generated alphabetically organized AVS.    Please see AVS for unique instructions that I personally wrote and verbalized to the the pt in detail and then reviewed with pt  by my nurse highlighting any  changes in therapy recommended at today's visit to their plan of care.

## 2016-08-25 ENCOUNTER — Encounter (HOSPITAL_COMMUNITY)
Admission: RE | Admit: 2016-08-25 | Discharge: 2016-08-25 | Disposition: A | Discharge status: Home / Self Care | Payer: Medicare Other | Source: Ambulatory Visit | Attending: Nephrology | Admitting: Nephrology

## 2016-08-25 DIAGNOSIS — N183 Chronic kidney disease, stage 3 unspecified: Secondary | ICD-10-CM

## 2016-08-25 DIAGNOSIS — D631 Anemia in chronic kidney disease: Secondary | ICD-10-CM | POA: Diagnosis not present

## 2016-08-25 LAB — RENAL FUNCTION PANEL
ALBUMIN: 3.3 g/dL — AB (ref 3.5–5.0)
Anion gap: 12 (ref 5–15)
BUN: 87 mg/dL — AB (ref 6–20)
CO2: 36 mmol/L — ABNORMAL HIGH (ref 22–32)
Calcium: 9.1 mg/dL (ref 8.9–10.3)
Chloride: 91 mmol/L — ABNORMAL LOW (ref 101–111)
Creatinine, Ser: 2.42 mg/dL — ABNORMAL HIGH (ref 0.61–1.24)
GFR calc Af Amer: 31 mL/min — ABNORMAL LOW (ref >60)
GFR, EST NON AFRICAN AMERICAN: 27 mL/min — AB (ref >60)
Glucose, Bld: 155 mg/dL — ABNORMAL HIGH (ref 65–99)
PHOSPHORUS: 4.2 mg/dL (ref 2.5–4.6)
POTASSIUM: 4 mmol/L (ref 3.5–5.1)
Sodium: 139 mmol/L (ref 135–145)

## 2016-08-25 LAB — POCT HEMOGLOBIN-HEMACUE: HEMOGLOBIN: 10.1 g/dL — AB (ref 13.0–17.0)

## 2016-08-25 LAB — IRON AND TIBC
Iron: 34 ug/dL — ABNORMAL LOW (ref 45–182)
Saturation Ratios: 13 % — ABNORMAL LOW (ref 17.9–39.5)
TIBC: 265 ug/dL (ref 250–450)
UIBC: 231 ug/dL

## 2016-08-25 LAB — FERRITIN: Ferritin: 775 ng/mL — ABNORMAL HIGH (ref 24–336)

## 2016-08-25 MED ORDER — DARBEPOETIN ALFA 200 MCG/0.4ML IJ SOSY
PREFILLED_SYRINGE | INTRAMUSCULAR | Status: AC
  Filled 2016-08-25: qty 0.4

## 2016-08-25 MED ORDER — DARBEPOETIN ALFA 200 MCG/0.4ML IJ SOSY
200.0000 ug | PREFILLED_SYRINGE | INTRAMUSCULAR | Status: DC
  Administered 2016-08-25: 200 ug via SUBCUTANEOUS

## 2016-09-08 ENCOUNTER — Encounter (HOSPITAL_COMMUNITY)
Admission: RE | Admit: 2016-09-08 | Discharge: 2016-09-08 | Disposition: A | Discharge status: Home / Self Care | Payer: Medicare Other | Source: Ambulatory Visit | Attending: Nephrology | Admitting: Nephrology

## 2016-09-08 DIAGNOSIS — N183 Chronic kidney disease, stage 3 unspecified: Secondary | ICD-10-CM

## 2016-09-08 DIAGNOSIS — D631 Anemia in chronic kidney disease: Secondary | ICD-10-CM | POA: Diagnosis not present

## 2016-09-08 LAB — POCT HEMOGLOBIN-HEMACUE: Hemoglobin: 10 g/dL — ABNORMAL LOW (ref 13.0–17.0)

## 2016-09-08 MED ORDER — DARBEPOETIN ALFA 200 MCG/0.4ML IJ SOSY
PREFILLED_SYRINGE | INTRAMUSCULAR | Status: AC
  Filled 2016-09-08: qty 0.4

## 2016-09-08 MED ORDER — DARBEPOETIN ALFA 200 MCG/0.4ML IJ SOSY
200.0000 ug | PREFILLED_SYRINGE | INTRAMUSCULAR | Status: DC
  Administered 2016-09-08: 14:00:00 200 ug via SUBCUTANEOUS

## 2016-09-21 ENCOUNTER — Other Ambulatory Visit (HOSPITAL_COMMUNITY): Payer: Self-pay | Admitting: *Deleted

## 2016-09-22 ENCOUNTER — Encounter (HOSPITAL_COMMUNITY)
Admission: RE | Admit: 2016-09-22 | Discharge: 2016-09-22 | Disposition: A | Discharge status: Home / Self Care | Payer: Medicare Other | Source: Ambulatory Visit | Attending: Nephrology | Admitting: Nephrology

## 2016-09-22 DIAGNOSIS — N183 Chronic kidney disease, stage 3 unspecified: Secondary | ICD-10-CM

## 2016-09-22 DIAGNOSIS — D631 Anemia in chronic kidney disease: Secondary | ICD-10-CM | POA: Diagnosis present

## 2016-09-22 LAB — RENAL FUNCTION PANEL
Albumin: 3.8 g/dL (ref 3.5–5.0)
Anion gap: 9 (ref 5–15)
BUN: 87 mg/dL — ABNORMAL HIGH (ref 6–20)
CHLORIDE: 86 mmol/L — AB (ref 101–111)
CO2: 42 mmol/L — ABNORMAL HIGH (ref 22–32)
CREATININE: 2.4 mg/dL — AB (ref 0.61–1.24)
Calcium: 9.1 mg/dL (ref 8.9–10.3)
GFR, EST AFRICAN AMERICAN: 31 mL/min — AB (ref >60)
GFR, EST NON AFRICAN AMERICAN: 27 mL/min — AB (ref >60)
Glucose, Bld: 109 mg/dL — ABNORMAL HIGH (ref 65–99)
PHOSPHORUS: 3.5 mg/dL (ref 2.5–4.6)
POTASSIUM: 3.6 mmol/L (ref 3.5–5.1)
Sodium: 137 mmol/L (ref 135–145)

## 2016-09-22 LAB — POCT HEMOGLOBIN-HEMACUE: HEMOGLOBIN: 10.2 g/dL — AB (ref 13.0–17.0)

## 2016-09-22 MED ORDER — DARBEPOETIN ALFA 200 MCG/0.4ML IJ SOSY
200.0000 ug | PREFILLED_SYRINGE | INTRAMUSCULAR | Status: DC
  Administered 2016-09-22: 200 ug via SUBCUTANEOUS

## 2016-09-22 MED ORDER — DARBEPOETIN ALFA 200 MCG/0.4ML IJ SOSY
PREFILLED_SYRINGE | INTRAMUSCULAR | Status: AC
  Administered 2016-09-22: 200 ug via SUBCUTANEOUS
  Filled 2016-09-22: qty 0.4

## 2016-10-06 ENCOUNTER — Encounter (HOSPITAL_COMMUNITY)
Admission: RE | Admit: 2016-10-06 | Discharge: 2016-10-06 | Disposition: A | Discharge status: Home / Self Care | Payer: Medicare Other | Source: Ambulatory Visit | Attending: Nephrology | Admitting: Nephrology

## 2016-10-06 DIAGNOSIS — D631 Anemia in chronic kidney disease: Secondary | ICD-10-CM | POA: Diagnosis not present

## 2016-10-06 DIAGNOSIS — N183 Chronic kidney disease, stage 3 unspecified: Secondary | ICD-10-CM

## 2016-10-06 LAB — POCT HEMOGLOBIN-HEMACUE: Hemoglobin: 10.6 g/dL — ABNORMAL LOW (ref 13.0–17.0)

## 2016-10-06 MED ORDER — DARBEPOETIN ALFA 200 MCG/0.4ML IJ SOSY
PREFILLED_SYRINGE | INTRAMUSCULAR | Status: AC
  Administered 2016-10-06: 14:00:00 200 ug via SUBCUTANEOUS
  Filled 2016-10-06: qty 0.4

## 2016-10-06 MED ORDER — DARBEPOETIN ALFA 200 MCG/0.4ML IJ SOSY
200.0000 ug | PREFILLED_SYRINGE | INTRAMUSCULAR | Status: DC
  Administered 2016-10-06: 200 ug via SUBCUTANEOUS

## 2016-10-20 ENCOUNTER — Encounter (HOSPITAL_COMMUNITY)
Admission: RE | Admit: 2016-10-20 | Discharge: 2016-10-20 | Disposition: A | Discharge status: Home / Self Care | Payer: Medicare Other | Source: Ambulatory Visit | Attending: Nephrology | Admitting: Nephrology

## 2016-10-20 DIAGNOSIS — D631 Anemia in chronic kidney disease: Secondary | ICD-10-CM | POA: Insufficient documentation

## 2016-10-20 DIAGNOSIS — N183 Chronic kidney disease, stage 3 unspecified: Secondary | ICD-10-CM

## 2016-10-20 LAB — RENAL FUNCTION PANEL
ANION GAP: 11 (ref 5–15)
Albumin: 3.6 g/dL (ref 3.5–5.0)
BUN: 70 mg/dL — ABNORMAL HIGH (ref 6–20)
CALCIUM: 9.2 mg/dL (ref 8.9–10.3)
CO2: 39 mmol/L — ABNORMAL HIGH (ref 22–32)
Chloride: 87 mmol/L — ABNORMAL LOW (ref 101–111)
Creatinine, Ser: 2.16 mg/dL — ABNORMAL HIGH (ref 0.61–1.24)
GFR calc Af Amer: 35 mL/min — ABNORMAL LOW (ref >60)
GFR calc non Af Amer: 31 mL/min — ABNORMAL LOW (ref >60)
Glucose, Bld: 98 mg/dL (ref 65–99)
Phosphorus: 2.9 mg/dL (ref 2.5–4.6)
Potassium: 4.1 mmol/L (ref 3.5–5.1)
Sodium: 137 mmol/L (ref 135–145)

## 2016-10-20 LAB — IRON AND TIBC
IRON: 36 ug/dL — AB (ref 45–182)
Saturation Ratios: 13 % — ABNORMAL LOW (ref 17.9–39.5)
TIBC: 276 ug/dL (ref 250–450)
UIBC: 240 ug/dL

## 2016-10-20 LAB — FERRITIN: Ferritin: 605 ng/mL — ABNORMAL HIGH (ref 24–336)

## 2016-10-20 LAB — URIC ACID: Uric Acid, Serum: 6.3 mg/dL (ref 4.4–7.6)

## 2016-10-20 MED ORDER — DARBEPOETIN ALFA 200 MCG/0.4ML IJ SOSY
PREFILLED_SYRINGE | INTRAMUSCULAR | Status: AC
  Administered 2016-10-20: 14:00:00 200 ug via SUBCUTANEOUS
  Filled 2016-10-20: qty 0.4

## 2016-10-20 MED ORDER — DARBEPOETIN ALFA 200 MCG/0.4ML IJ SOSY
200.0000 ug | PREFILLED_SYRINGE | INTRAMUSCULAR | Status: DC
  Administered 2016-10-20: 200 ug via SUBCUTANEOUS

## 2016-10-24 LAB — POCT HEMOGLOBIN-HEMACUE: HEMOGLOBIN: 11.2 g/dL — AB (ref 13.0–17.0)

## 2016-11-03 ENCOUNTER — Encounter (HOSPITAL_COMMUNITY)
Admission: RE | Admit: 2016-11-03 | Discharge: 2016-11-03 | Disposition: A | Discharge status: Home / Self Care | Payer: Medicare Other | Source: Ambulatory Visit | Attending: Nephrology | Admitting: Nephrology

## 2016-11-03 DIAGNOSIS — N183 Chronic kidney disease, stage 3 unspecified: Secondary | ICD-10-CM

## 2016-11-03 DIAGNOSIS — D631 Anemia in chronic kidney disease: Secondary | ICD-10-CM | POA: Diagnosis not present

## 2016-11-03 LAB — POCT HEMOGLOBIN-HEMACUE: Hemoglobin: 10.6 g/dL — ABNORMAL LOW (ref 13.0–17.0)

## 2016-11-03 MED ORDER — DARBEPOETIN ALFA 200 MCG/0.4ML IJ SOSY
PREFILLED_SYRINGE | INTRAMUSCULAR | Status: AC
  Administered 2016-11-03: 200 ug via SUBCUTANEOUS
  Filled 2016-11-03: qty 0.4

## 2016-11-03 MED ORDER — DARBEPOETIN ALFA 200 MCG/0.4ML IJ SOSY
200.0000 ug | PREFILLED_SYRINGE | INTRAMUSCULAR | Status: DC
  Administered 2016-11-03: 200 ug via SUBCUTANEOUS

## 2016-11-17 ENCOUNTER — Inpatient Hospital Stay (HOSPITAL_COMMUNITY): Admission: RE | Admit: 2016-11-17 | Payer: Medicare Other | Source: Ambulatory Visit

## 2016-11-20 ENCOUNTER — Encounter (HOSPITAL_COMMUNITY)
Admission: RE | Admit: 2016-11-20 | Discharge: 2016-11-20 | Disposition: A | Discharge status: Home / Self Care | Payer: Medicare Other | Source: Ambulatory Visit | Attending: Nephrology | Admitting: Nephrology

## 2016-11-20 DIAGNOSIS — N183 Chronic kidney disease, stage 3 unspecified: Secondary | ICD-10-CM

## 2016-11-20 DIAGNOSIS — D631 Anemia in chronic kidney disease: Secondary | ICD-10-CM | POA: Insufficient documentation

## 2016-11-20 LAB — URIC ACID: URIC ACID, SERUM: 6 mg/dL (ref 4.4–7.6)

## 2016-11-20 LAB — POCT HEMOGLOBIN-HEMACUE: HEMOGLOBIN: 10.9 g/dL — AB (ref 13.0–17.0)

## 2016-11-20 LAB — RENAL FUNCTION PANEL
ALBUMIN: 3.6 g/dL (ref 3.5–5.0)
Anion gap: 10 (ref 5–15)
BUN: 103 mg/dL — ABNORMAL HIGH (ref 6–20)
CALCIUM: 8.8 mg/dL — AB (ref 8.9–10.3)
CO2: 41 mmol/L — ABNORMAL HIGH (ref 22–32)
Chloride: 86 mmol/L — ABNORMAL LOW (ref 101–111)
Creatinine, Ser: 3.05 mg/dL — ABNORMAL HIGH (ref 0.61–1.24)
GFR, EST AFRICAN AMERICAN: 23 mL/min — AB (ref >60)
GFR, EST NON AFRICAN AMERICAN: 20 mL/min — AB (ref >60)
GLUCOSE: 151 mg/dL — AB (ref 65–99)
Phosphorus: 4.7 mg/dL — ABNORMAL HIGH (ref 2.5–4.6)
Potassium: 3.9 mmol/L (ref 3.5–5.1)
SODIUM: 137 mmol/L (ref 135–145)

## 2016-11-20 MED ORDER — DARBEPOETIN ALFA 200 MCG/0.4ML IJ SOSY
PREFILLED_SYRINGE | INTRAMUSCULAR | Status: AC
  Administered 2016-11-20: 14:00:00 200 ug via SUBCUTANEOUS
  Filled 2016-11-20: qty 0.4

## 2016-11-20 MED ORDER — DARBEPOETIN ALFA 200 MCG/0.4ML IJ SOSY
200.0000 ug | PREFILLED_SYRINGE | INTRAMUSCULAR | Status: DC
  Administered 2016-11-20: 200 ug via SUBCUTANEOUS

## 2016-12-04 ENCOUNTER — Encounter (HOSPITAL_COMMUNITY)
Admission: RE | Admit: 2016-12-04 | Discharge: 2016-12-04 | Disposition: A | Discharge status: Home / Self Care | Payer: Medicare Other | Source: Ambulatory Visit | Attending: Nephrology | Admitting: Nephrology

## 2016-12-04 DIAGNOSIS — D631 Anemia in chronic kidney disease: Secondary | ICD-10-CM | POA: Diagnosis not present

## 2016-12-04 DIAGNOSIS — N183 Chronic kidney disease, stage 3 unspecified: Secondary | ICD-10-CM

## 2016-12-04 LAB — POCT HEMOGLOBIN-HEMACUE: Hemoglobin: 10.9 g/dL — ABNORMAL LOW (ref 13.0–17.0)

## 2016-12-04 MED ORDER — DARBEPOETIN ALFA 200 MCG/0.4ML IJ SOSY
PREFILLED_SYRINGE | INTRAMUSCULAR | Status: AC
  Filled 2016-12-04: qty 0.4

## 2016-12-04 MED ORDER — DARBEPOETIN ALFA 200 MCG/0.4ML IJ SOSY
200.0000 ug | PREFILLED_SYRINGE | INTRAMUSCULAR | Status: DC
  Administered 2016-12-04: 200 ug via SUBCUTANEOUS

## 2016-12-08 ENCOUNTER — Encounter: Payer: Self-pay | Admitting: Cardiovascular Disease

## 2016-12-08 ENCOUNTER — Ambulatory Visit (INDEPENDENT_AMBULATORY_CARE_PROVIDER_SITE_OTHER): Payer: Medicare Other | Admitting: Cardiovascular Disease

## 2016-12-08 VITALS — BP 112/62 | HR 99 | Ht 73.0 in | Wt 243.0 lb

## 2016-12-08 DIAGNOSIS — I110 Hypertensive heart disease with heart failure: Secondary | ICD-10-CM

## 2016-12-08 DIAGNOSIS — I2510 Atherosclerotic heart disease of native coronary artery without angina pectoris: Secondary | ICD-10-CM

## 2016-12-08 DIAGNOSIS — I5032 Chronic diastolic (congestive) heart failure: Secondary | ICD-10-CM

## 2016-12-08 DIAGNOSIS — I739 Peripheral vascular disease, unspecified: Secondary | ICD-10-CM | POA: Diagnosis not present

## 2016-12-08 NOTE — Assessment & Plan Note (Signed)
History of CAD status post bypass grafting X 6 in 2001. He had an obtuse marginal branch stent 12/05 and a PDA vein graft stent 7/10 with an OM circumflex DS 8/10 and a RCA vein graft stent 7/11. His last Myoview in February 2015 was nonischemic. He denies chest pain.

## 2016-12-08 NOTE — Patient Instructions (Signed)

## 2016-12-08 NOTE — Assessment & Plan Note (Signed)
History of chronic diastolic heart failure with preserved LV function and grade 1 diastolic dysfunction.

## 2016-12-08 NOTE — Assessment & Plan Note (Signed)
History of peripheral arterial disease status post left SFA PTA September 2009 with occluded right SFA. He denies claudication.

## 2016-12-08 NOTE — Progress Notes (Signed)
12/08/2016 [name] [name]   1951/07/16  [id number]  Primary Physician Philis Fendt, MD Primary Cardiologist: Lorretta Harp MD Renae Gloss  HPI:   The patient is a 66 year old obese African American male with history of coronary artery disease, diastolic dysfunction, COPD who is O2 dependent, peripheral vascular disease, chronic renal insufficiency stage 3, diabetes mellitus, hypertension and obstructive sleep apnea. I last saw him in the office 12/07/15.The patient was hospitalized earlier in the year with acute respiratory failure and hypoxia, likely related to volume overload from diastolic dyslipidemia. He was diuresed with IV Lasix, metolazone. He was also seen from April 3rd to April 7th, apparently with a COPD exacerbation. The patient has a stent in the left SFA which was put in in September of 2009 for claudication. He also does have a short-segment occlusion of the right mid SFA with tibioperoneal disease as well. He had coronary artery bypass grafting in 2001 with PCI stenting of his OM branch in December of 2005. Last cardiac catheterization was in July of 2011 revealing a patent LIMA to the LAD, 80% stenosis beyond that, and a patent vein to the large venous branch, a total vein to the PDA which was restented using a Taxus ion drug-eluting stent with a patent circumflex to the obtuse marginal branch and normal LV function.   His last nuclear stress test was February 2015. With ejection fraction was 62%; it was a low risk study with normal nuclear imaging. No significant change from previous study. This study was done for ST changes to his EKG which are still present and look similar to previous EKG with a right bundle branch block and slightly tachycardic at 104.   Since I saw him a year ago he's remained stable. He has lost 20 pounds since I last saw him. His breathing is stable. He denies chest pain.   Current Outpatient Prescriptions  Medication Sig Dispense  Refill  . acetaminophen (TYLENOL) 500 MG tablet Take 500 mg by mouth every 6 (six) hours as needed for mild pain or moderate pain.    Marland Kitchen albuterol (PROVENTIL) (2.5 MG/3ML) 0.083% nebulizer solution Take 3 mLs (2.5 mg total) by nebulization every 4 (four) hours as needed for wheezing or shortness of breath.    . allopurinol (ZYLOPRIM) 100 MG tablet Take 1 tablet by mouth daily.    Marland Kitchen arformoterol (BROVANA) 15 MCG/2ML NEBU Take 2 mLs (15 mcg total) by nebulization 2 (two) times daily. 75 mL 5  . aspirin 81 MG tablet Take 81 mg by mouth daily.    . budesonide (PULMICORT) 0.5 MG/2ML nebulizer solution USE ONE VIAL IN NEBULIZER TWICE DAILY 75 mL 5  . clopidogrel (PLAVIX) 75 MG tablet Take 1 tablet (75 mg total) by mouth daily. 30 tablet 6  . COLCRYS 0.6 MG tablet Take 0.6 mg by mouth daily as needed.     Marland Kitchen dextromethorphan-guaiFENesin (MUCINEX DM) 30-600 MG 12hr tablet Take 1 tablet by mouth 2 (two) times daily. Patient takes for mucus relief    . furosemide (LASIX) 40 MG tablet TAKE TWO TABLETS BY MOUTH IN THE MORNING AND ONE TABLET IN THE EVENING 90 tablet 9  . metolazone (ZAROXOLYN) 2.5 MG tablet TAKE ONE TABLET BY MOUTH ONCE DAILY ON  MONDAY,  WEDNESDAY,  AND  FRIDAY  30  MINUTES  BEFORE  LASIX 30 tablet 3  . nitroGLYCERIN (NITROSTAT) 0.4 MG SL tablet Place 1 tablet (0.4 mg total) under the tongue every 5 (five)  minutes as needed. For chest pain 25 tablet 6   No current facility-administered medications for this visit.     Allergies  Allergen Reactions  . Amlodipine Besy-Benazepril Hcl Swelling    LIPS SWELL ANGIOEDEMA  . Percocet [Oxycodone-Acetaminophen] Other (See Comments)    HALLUCINATIONS    Social History   Social History  . Marital status: Married    Spouse name: N/A  . Number of children: N/A  . Years of education: N/A   Occupational History  . Not on file.   Social History Main Topics  . Smoking status: Former Smoker    Packs/day: 1.00    Years: 25.00    Types:  Cigarettes    Quit date: 08/14/1998  . Smokeless tobacco: Never Used  . Alcohol use No  . Drug use: No  . Sexual activity: Yes   Other Topics Concern  . Not on file   Social History Narrative  . No narrative on file     Review of Systems: General: negative for chills, fever, night sweats or weight changes.  Cardiovascular: negative for chest pain, dyspnea on exertion, edema, orthopnea, palpitations, paroxysmal nocturnal dyspnea or shortness of breath Dermatological: negative for rash Respiratory: negative for cough or wheezing Urologic: negative for hematuria Abdominal: negative for nausea, vomiting, diarrhea, bright red blood per rectum, melena, or hematemesis Neurologic: negative for visual changes, syncope, or dizziness All other systems reviewed and are otherwise negative except as noted above.    Blood pressure 112/62, pulse 99, height 6\' 1"  (1.854 m), weight 243 lb (110.2 kg).  General appearance: alert and no distress Neck: no adenopathy, no carotid bruit, no JVD, supple, symmetrical, trachea midline and thyroid not enlarged, symmetric, no tenderness/mass/nodules Lungs: clear to auscultation bilaterally Heart: regular rate and rhythm, S1, S2 normal, no murmur, click, rub or gallop Extremities: extremities normal, atraumatic, no cyanosis or edema  EKG sinus rhythm at 99 with right bundle-branch block/left anterior fascicular block (bifascicular block). I personally reviewed this EKG.  ASSESSMENT AND PLAN:   Chronic diastolic CHF (congestive heart failure) ([facility]) History of chronic diastolic heart failure with preserved LV function and grade 1 diastolic dysfunction.  PVD (peripheral vascular disease) ([place]) History of peripheral arterial disease status post left SFA PTA September 2009 with occluded right SFA. He denies claudication.  CAD (coronary artery disease) History of CAD status post bypass grafting X 6 in 2001. He had an obtuse marginal branch stent 12/05 and a PDA  vein graft stent 7/10 with an OM circumflex DS 8/10 and a RCA vein graft stent 7/11. His last Myoview in February 2015 was nonischemic. He denies chest pain.      Lorretta Harp MD FACP,FACC,FAHA, [facility] 12/08/2016 3:06 PM

## 2016-12-12 ENCOUNTER — Ambulatory Visit (INDEPENDENT_AMBULATORY_CARE_PROVIDER_SITE_OTHER): Payer: Medicare Other | Admitting: Internal Medicine

## 2016-12-12 ENCOUNTER — Encounter: Payer: Self-pay | Admitting: Internal Medicine

## 2016-12-12 DIAGNOSIS — J9612 Chronic respiratory failure with hypercapnia: Secondary | ICD-10-CM | POA: Diagnosis not present

## 2016-12-12 DIAGNOSIS — J9611 Chronic respiratory failure with hypoxia: Secondary | ICD-10-CM

## 2016-12-12 NOTE — Assessment & Plan Note (Signed)
Continue day time trach collar with o2 and night vent support Currently stable disease Continue nebs Congratulation on weight loss  We can think about trach removal when you get down to 220 pounds and definitely more so when you are at 200 pounds  Followup 3 months or sooner if needed

## 2016-12-12 NOTE — Progress Notes (Signed)
Subjective:     Patient ID: [name] [name], male   DOB: [birth year], 66 y.o.   MRN: [id number]  HPI   01/23/11- COPD, CAD/diastolic dysfunction, OSA, chronic respiratory failure, obesity Hypoventilation, cor pulmonale Hosp since last here- once for "shakes"-they told him wasn't using CPAP right, once for epistaxis, once to place urinary stent, and has had a right ? renal biopsy. Told anemic.  Breathing stable, but can't lie flat- feels smothered even on O2.  Runs O2 2-3L/M usually. I gave permision to go to 4 during exertion. Notes "tiresome" feeling midchest, related to exertion/ climbing stairs and relieved by rest. Told to pace himself and not try to push through that.  Discussed need for O2. He doesn't sleep well with his BiPAP 15/12. Discussed sleep hygiene- discussed room temperature. He was more comfortable with autotitration in hosp and we discussed a trial of that at home.  Has restarted diuretic since leg edema has come back.   05/25/11-  COPD, CAD/diastolic dysfunction, OSA, chronic respiratory failure, obesity Hypoventilation, cor pulmonale Went to ER last week- short of breath. Air wasn't satisfying. Was started back on shot for anemia. Feels some better.  Reports sneezing, postnasal drainage and a sense of mucus in his upper chest. Denies fever, sore throat and doesn't think he has a cold.  06/28/11-  COPD, CAD/diastolic dysfunction, OSA, chronic respiratory failure, obesity Hypoventilation, cor pulmonale Recently hospitalized October 25-29, notes reviewed with him and x-ray images reviewed by me. He was hospitalized for hypercapnic respiratory failure with transient encephalopathy and renal insufficiency. Since discharge he has regained some ankle edema while off of furosemide. He has a nephrology appointment later this month. He is concerned that he was taken off his diabetes medicines and is directed to discuss this with his primary physician immediately. He has some persistent  soreness across his anterior chest wall at the level of the xiphoid but otherwise breathing better with less exertional dyspnea and before he went in the hospital. He remains dependent on continuous oxygen at 2 L. He continues his BiPAP machine all night, every night but says he was sleeping better with it  on autotitration with a lower pressure used in the hospital.  09/25/11- COPD, CAD/diastolic dysfunction, OSA, chronic respiratory failure, obesity Hypoventilation, cor pulmonale Since last here he was hospitalized for respiratory failure with CHF and obesity hypoventilation. Better with diuresis. BiPAP 13/10 is now comfortable and used all night every night with supplemental oxygen 4 L. Tolerated colonoscopy without respiratory distress. Uses a rescue inhaler only occasionally but says it does help then.  11/23/11- COPD, CAD/diastolic dysfunction, OSA, chronic respiratory failure, obesity Hypoventilation, cor pulmonale She is noticing some increased shortness of breath on exertion such as getting in the car but no increase in ankle edema which is well controlled. Occasional cough is not progressive or productive. Noticing more rhinorrhea with the pollen. Continues BiPAP  I 13/E 10 all night every night.  02/07/12- COPD, CAD/diastolic dysfunction, OSA, chronic respiratory failure, obesity Hypoventilation, cor pulmonale  pt states doing some better.wakes up out of a nap coughing feels like he's choking.  Post Hosp- 01/14/12- 01/23/12-discharge diagnoses reviewed with him: Coronary artery disease/CABG, diastolic dysfunction, COPD with chronic respiratory failure, acute respiratory failure with hypoxia, acute on chronic renal failure, DM. He was intubated for 4 days and treated with Zosyn, vancomycin and Avelox, Levaquin, Zithromax and Rocephin. Cultures negative. He was to wean off of prednisone over 3 days. He was hoarse after intubation but that has resolved. He  is using oxygen 1-2 L and BiPAP  13/10 at  night. He feels he is pretty close to his baseline.  03/28/12-  COPD, CAD/diastolic dysfunction/ chronic CHF, OSA, chronic respiratory failure, obesity Hypoventilation, cor pulmonale  6 MWT today. Pt states breathing has been "okay" since last OV. Pt states having trouble falling alseep-not sleeping well on BiPAP. Pt states mask fit well but thinks the pressure needs to be adjusted again. Currently 13/10+ oxygen at 2 L/ Advanced. He sleeps better in a recliner with oxygen but without his BiPAP. Getting iron injections for anemia. We discussed shortness of breath and anemia. Pain left anterior axillary line described as a sore ache over the past week. He is able to raise his arm. Thinks he maybe lifted something wrong. COPD assessment test (CAT) 21/40 CXR 01/14/12- 1 view- IMPRESSION:  Stable support apparatus. Residual mild interstitial prominence  with slight improvement in aeration. No convincing pulmonary  edema. Probable small left pleural effusion with left basilar  atelectasis or infiltrate.  Original Report Authenticated By: [name], M.D.   [birth year]-KXFG, CAD/diastolic dysfunction/ chronic CHF, OSA, chronic respiratory failure, obesity Hypoventilation, cor pulmonale Hospitalized again between March 9 and 12 for acute on chronic renal failure with congestive heart failure, chronic respiratory failure on BiPAP and acute respiratory failure with hypoxia. He says he is feeling better at this time. Continues oxygen 3 L/Advanced. He has been using BiPAP 13/10 but thinks he slept better with CPAP. We discussed options. Usually when he decompensates it is from fluid overload triggering CO2 retention and hypoventilation.  CXR 10/20/12 IMPRESSION:  Vascular congestion and cardiomegaly, with small bilateral pleural  effusions and bibasilar airspace opacification. Findings are most  compatible with recurrent pulmonary edema, though underlying  pneumonia cannot be excluded.  Original Report  Authenticated By: [name], M.D.  12/02/2012 [facility] follow up  Returns for a post hospital follow up .  Reports doing well overall but still having some SOB, thinks this may be due to his pressure not being high enough on BIPAP. Since decreasing BIPAP 1 month ago  down to 12/10 he does not feel as good. Trouble sleeping.  Was admitted for decompensated cor pulmonale w/ fluid overload with subsequent acute on chronic hypercarbic resp failure.  He admits he can not afford his advair and spiriva- therefore he is not taking.  He does not wear BIPAP everynight. We discussed the implications of this and untreated OHS/OSA. He naps a lot but does not wear BIPAP.  He was treated with diuresis with improvement.  Discharged on Lasix 40mg  . Lower dose due to bump in scr. Has OP follow up with Renal d/t renal insufficiency.   02/05/13- COPD, CAD/diastolic dysfunction/ chronic CHF, OSA, chronic respiratory failure, obesity Hypoventilation, cor pulmonale Hosp 6/16-6/19/14 Acute on Chronic Resp Failure Discharge Plan:  -pt educated on BiPAP compliance at home  -continue pulmicort, scheduled albuterol / atrovent (med cost has been an issue)  -wean prednisone to off  -given information for [employer] at [employer]. Pulmonary Rehab was a cost issue (45$ per visit)  -RN home health to review medications and reinforce need for BiPAP 12/12 wO2 2-3L for sleep, O2 3-4 awake.  FOLLOWS FOR: Breathing has improved since leaving the hospital. Reports DOE, chest tightness with exertion and slight coughing from time to time. Wife needs FMLA form. CXR 01/29/13 1View IMPRESSION:  Slightly improved aeration in the lungs may represent decreasing  edema.  Original Report Authenticated By: Markus Daft, M.  04/11/13- COPD,  CAD/diastolic dysfunction/ chronic CHF, OSA, chronic respiratory failure, obesity Hypoventilation, cor pulmonale FOLLOWS FOR: reports breathing is essentially unchanged but he feels like he's  "burning through more oxygen than I used to." denies wheezing, increased SOB, tightness, increased cough, f/c/s Just in hospital again acute on chronic respiratory failure with CHF and COPD. Today he feels well except for frontal headache. Continuing oxygen at 2.5 L. BIPAP Auto 5-15/ O2 2L/ Advanced CXR 1V 8/14 IMPRESSION:  1. Extubated, enteric tube removed. Stable lung volumes.  2. Stable ventilation. Small pleural effusions.  Electronically Signed  By: Lars Pinks  On: 04/02/2013 07:43  06/23/2013 [facility] follow up  Patient presents for a post hospital followup. Patient was admitted October 7 through October 30 for acute on chronic respiratory failure. Patient did require intubation for airway protection in the emergency room due to acute encephalopathy. Unfortunately, patient was unable to be weaned from the ventilator and underwent a tracheostomy. Patient was weaned to trach collar during the daytime and vent support. At bedtime. Lurline Idol was placed by ENT Dr. Redmond Baseman. He was transitioned to an extra long #6 trach prior to discharge.  Patient returns today accompanied by his wife. Patient reports that he has been doing okay. He strength. He is tolerating vent support at night. However, he does feel that this is uncomfortable in certain times. Vent settings at night are PRVC at a rate of 14 with a tidal volume of 450 cc with FiO2 of 40%, and a PEEP of 5. Patient has been using Passy-Muir valve for speech however, over the last couple days, feels it's been more difficulties than his usual. He does have follow with ENT later this week for trach evaluation. Patient denies any hemoptysis, trach site. Bleeding, Orthopnea, PND, or increased leg swelling. Has been eating okay without any noted , dysphagia. Appetite is decreased with no nausea, vomiting, diarrhea.  REC Continue on current regimen.  follow up with ENT this week for [facility] evaluation.  I will call with labs and xray results.   Continue on Trach Collar during daytime  Continue on Vent support At bedtime   Follow up Dr. Chase Caller in 6-8 weeks and As needed .  Please contact office for sooner follow up if symptoms do not improve or worsen or seek emergency care   Late Add : ? Right sided infiltrate  Add Levaquin 500mg  daily x 7 days #7 , no refills.    OV 07/21/2013 Followup chronic respiratory failure status post tracheostomy. Since seeing my nurse practitioner a month ago he is doing well. Uses ventilator at night and daytime is on tracheostomy collar. He is not compliant with his Passy-Muir valve because it gives him a sensation of suffocation. But he is able to talk with open tracheostomy. He still reports chronic stable dyspnea on exertion for walking a block or sometimes half a block only. This is relieved by rest. Rated as moderate. There is no associated chest pain no worsening cough or phlegm. He did have some hemoptysis yesterday and he is trying to get in contact with the ENT specialist. As noted just beside bleeding. He denies any orthopnea or paroxysmal nocturnal dyspnea. Eating okay.  Lab review I do note that is not had a CT scan of the chest since 2012 rec Glad you are doing well CMA will refer you to pumonary rehab on basis of chronic resp failure Please do CT scan chest for pleural fibrosis, pleural plaques Followup with ENT> Dr Redmond Baseman, changed trach on 12/15  07/31/2013 f/u ov/Wert re: worse since 07/24/13 (day of CT Chest - see below) Chief Complaint  Patient presents with  . Acute Visit    Pt c/o increased SOB for the past several days. He states gets out of breath with any sort of exertion.  He has minimal cough that is non prod.   mucus is clear/ not bloody Baseline use of alb not much at all,  One every few days at most Assoc with increased fluid in legs  But followed by Gwenlyn Found who adjusts his lasix  No obvious patterns in day to day or daytime variabilty or assoc chronic cough or cp or  chest tightness, subjective wheeze overt sinus or hb symptoms. No unusual exp hx or h/o childhood pna/ asthma or knowledge of premature birth.  Sleeping ok without nocturnal  or early am exacerbation  of respiratory  c/o's or need for noct saba. Also denies any obvious fluctuation of symptoms with weather or environmental changes or other aggravating or alleviating factors except as outlined above    09/15/2013 Follow up  Returns for a 2 month follow up for COPD, chronic resp failure -trach dependent.  Reports breathing has worsened x2 weeks w/ increased SOB, chest tightness, cough with white mucus. Coughing and congestion make it difficult to use vent at night . Denies any f/c/s, nausea, vomiting, hemoptysis, edema.  Was referred to pulmonary rehab however could not afford co-pay amounts. We discussed looking into pt assistance.    OV 10/28/2013  Chief Complaint  Patient presents with  . Follow-up    Pt is c/o rattling in his chest, and dry cough. Pt also c/o increased swelling in both ankles.     Followup chronic respiratory failure status post tracheostomy by ENT. Chronic respiratory failure on account of COPD, OSA, diastolic heart failure  - Overall he is stable. He reports using the ventilator at night but occasionally he is noncompliant. When he does not use the ventilator the next day he is more fatigued according to his wife. In the daytime he is on trach collar on room air of liters of oxygen. He occasionally uses his Passy-Muir valve for talk but otherwise feels a sense of suffocation. The last few weeks he's noticed some increased pedal edema and despite increasing and self titrating his Lasix. In association with this he is more rattling in his chest and more shortness of breath and cough but there is no change in sputum color. The sputum is not pink and frothy. He has not seen his cardiologist Dr.? Dr Adora Fridge a while and he plans to see him more expeditiously. Denies any chest pain  cough fever chills or wheezing.  Copd appears stable However, diastolic chf might be worse My office will help you seen by Dr Gwenlyn Found office next day or two  REturn to see me in 4 months or sooner if needed  - continue trach care through ENT clinic   OV 02/24/2014  Chief Complaint  Patient presents with  . Follow-up    Pt c/o increase SOB, cough with clear mucous, intermittent edema in BIL ankles. C/o upper mid sternal CP and left upper CP when on ventilator.    Followup chronic respiratory failure on account of obesity, sleep apnea, COPD and diastolic heart failure  - He continues to feel poorly. For the last month or so he produces increased shortness of breath associated with worsening pedal edema. Is only on Lasix 40 mg once daily. He says he is compliant with  his nebulizers. Although, he is noncompliant with his ventilator stating that the pressures are too high. He probably uses it less than 50% of the time. In addition he is very keen on getting his tracheostomy removed permanently this is despite our advice that he keep it in permanently. He is frustrated by his quality of life. I've explained to him that weight loss is paramount but he is not too keen on this. He reports that his cough, sputum or in sputum color or roughly the same as baseline without any fever >>pred taper , increased lasix 40mg  Twice daily    03/10/14 Follow up COPD/OHS/ Trach Depend RF /Noct Vent Returns for follow up for COPD . Seen last ov with COPD flare and suspected decompensated Diastolic CHF. He was treated with prednisone taper and diuretics were increased.  He says he is some better but gets winded easily.  Encouraged to use vent at night .  Leg swelling slightly better. Labs last week showed scr stable.  No chest pain , hemoptyiss, fever or n/vd/     O 05/12/2014  Chief Complaint  Patient presents with  . Follow-up    Pt states his breathing has slightly worsened since last OV. Pt states he has a  slight increase in SOB, non prod cough and chest pain when coming off the vent in the morning.    Followup chronic respiratory failure on account of obesity, sleep apnea, COPD and diastolic heart failure and Trach Depend RF /Noct Vent   - presents with wife. Continues to report frustration having trach in life; more resigned to having it. He is frustrated by class 3 exertional fatigue and dyspnea. REcently hgb 6.3gm% at renal; no obvious bleed and renal managing it but dyspnea is chronic. Noc hange in baseline cough and white sputum of moderate intensity. Continues vent at night; more compliant.  Uptodate with flu shot. He has cards appt pending in  <  2 months per hx   12/10/2014 [facility] follow up  Patient presents for a post hospital follow-up Patient was admitted April 5 through April 7 for pneumonia and acute on chronic hypercarbic respiratory failure Influenza panel was negative. He was treated with IV antibiotics, steroids, and nebulized bronchodilators. CT chest showed a right middle lobe pneumonia. Patient remains on trach collar 4.5L  during the daytime with vent support at night. He says since discharge he is feeling some better. He still continues to feel weak. He denies any hemoptysis, orthopnea, PND or leg swelling Seen by ENT with trach change  Last week.  Remains on Pulmicort and brovana nebs.  Wt is down from discharge (~6 lbs ) , decreased leg swelling.  Does tell me he turns up O2 to 6-7 L when he feels sob even though O2 sats >90%.  . We discussed O2 sats gaol of 90-92%.    01/22/2015 Follow up: COPD /trach dependent , noct vent  Returns for 2 week follow up.  Patient was given Cipro last visit for persistent cough and congestion. Last ov lasix increased for fluid overload.  Reports breathing is improved since last ov, cough is now clear, still having some occasional tightness when coming off the vent. Overall feels better.   CXR shows slight improvement in  aeration today  Wt is down .Leg swelling is less.   Going to ENT to discuss trach options-would prefer to not have trach.   He denies any chest pain, orthopnea, PND or increased  leg swelling  OV 03/10/2015  Chief Complaint  Patient presents with  . Follow-up    Still having SOB and coughing up mostly clear thick mucus with occ green mucus.     Followup chronic respiratory failure on account of obesity, sleep apnea, COPD and diastolic heart failure   Is improved from his recent exacerbation with H. influenzae.. Currently he is here with his wife. Both at test that he is doing well. He uses nocturnal ventilation. Daytime tracheostomy with Passy-Muir valve. He is very adamant that he wants to get his tracheostomy removed no matter what the consequences are. Currently feeling fine. Baseline chronic venous stasis edema present. There are no acute complaints. >>discussed with ENT to set up for trial of trach capping   .04/14/2015 Follow up : COPD /trach dependent , noct vent , OSA/OHS, Diastolic CHF  Pt returns for 1 month follow up  Complains of increased cough and congestion Pt c/o SOB and wheezing at times, chest congestion with a prod cough with light green/yellow mucus.  Denies any chest tightness, fever, or nausea and vomitting. Pt has not made appt with ENT for trach capping.  Pt was suppose to see ENT to be set up for a trial of Trach capping for 4 weeks . Then to decide if able to decanulate.  Would like [place] to call over and get this set up .  Remains on Brovana and Pulmicort .  On Oxygen 4l/m in daytime with trach collar and Vent at At bedtime  .  No chest pain, orthopnea, edema or fever   Recently moved. Starting to get settled in.    06/25/2015 [facility] follow up : COPD /trach dependent , noct vent , OSA/OHS, Diastolic CHF  Patient presents for a post hospital follow-up Patient was admitted 18th to October 21 for acute on chronic hypercarbic respiratory failure. He  was admitted to evaluate for possible decannulation.  Trials of trach capping with nocturnal BiPAP were tried, however, patient has significant worsening respiratory acidosis with PCO2's greater than 100, with associated decreased level of consciousness. Long discussion with patient and wife that this is not a good idea and he should continue with tracheostomy. Since discharge he is doing okay. Remains on trach collar daytime and vent At bedtime   Cough , congestion are at baseline.  Gets winded easily but at his baseline He is sad about trach, support provided.  He denies chest pain, orthopnea , fever or hemoptysis   OV 08/18/2015  Chief Complaint  Patient presents with  . Follow-up    Pt c/o chest congestion with difficulty bring the mucus up and c/o increase in SOB from baseline. Pt denies issues on the vent at night.     Follow-up severe COPD tracheostomy dependent nocturnal ventilator OSA/OHS/diastolic heart failure. Failed attempted decannulation October 2016  Presents for routine follow-up with his wife as always. He says that he still has hope that one day he can come off the ventilator and tracheostomy. He continues to be obese although sees says he has lost weight. His current BMI is 36. Overall he is stable. Mucus is now clear. Although after telling me stable he suddenly said that he is having some mild worsening dyspnea for the past week. In addition when he eats some food he feels a noise in his throat. He then has to clear his food. He denies this is dysphagia. He absolutely is certain this is not dysphagia. He is asking for treatment especially for prednisone or antibiotics although there  is no fever or change in sputum color or volume. He does not want to go through a GI evaluation or speech evaluation. He says in October 2016 he was advised to chin tuck and eat and he says current problem is not dysphagia.   OV 11/17/2015  Chief Complaint  Patient presents with  . Follow-up     Pt states his breathing is overall unchanged. Pt c/o non prod cough with congestion that he is unable to cough up.   Follow-up severe COPD tracheostomy dependent nocturnal ventilator OSA/OHS/diastolic heart failure. Failed attempted decannulation October 2016  Presents for routine follow-up with his wife as always. He says that he still has hope that one day he can come off the ventilator and tracheostomy. He continues to be obese although sees says he has lost weight but this is due to lasix. He is frustrated by his trach and vent. Says still not reconciled. His current Body mass index is 36.26 kg/(m^2). Marland Kitchen Overall he is stable. Mucus is now clear. Throat problme resolved after prednisone at last ov. Has upcoming cards visit    05/24/2016 NP Acute OV  Patient presents for an acute office visit Patient complains of prod cough with thick yellow mucus, chest congestion/tightness, SOB and wheezing x 2 -3weeks ago. Denies any sinus pressure/drainage, fever, nausea or vomiting, hemoptysis.  Appetite is good .  Remains on Vent at night . And trach collar during the daytime Weight is steady . Has decreased leg swelling . No orthopnea .  Denies any chest pain, orthopnea, PND, or  Hemoptysis .  rec Doxycycline 100mg  Twice daily  For 7 days . Take w/ food and eat yogurt.  Mucinex DM Twice daily  As needed  Cough/congestion  Prednisone taper over next week.  Continue on Trach Collar during daytime  Continue on Vent support At bedtime      08/09/2016  Acute  ov/Wert [id number]  worse for 4 days / chronic resp failure and noct vent dep  Chief Complaint  Patient presents with  . Acute Visit    Pt c/o increased cough for the past few days. He is coughing up large amounts of pale yellow sputum.  He gets tired very easily.     Sleeping on vent and 4lpm per t collar daytime  Still does ok on vent at night despite flare of cough  Confused what he's using in neb rx / alb not listed but has it "can use it  once a day if I have to" and not clear he understands how to use it prn up to every 4 hours   No obvious day to day or daytime variability or assoc   mucus plugs or hemoptysis or cp or chest tightness, subjective wheeze or overt sinus or hb symptoms. No unusual exp hx or h/o childhood pna/ asthma or knowledge of premature birth.  Sleeping ok without nocturnal  or early am exacerbation  of respiratory  c/o's or need for noct saba. Also denies any obvious fluctuation of symptoms with weather or environmental changes or other aggravating or alleviating factors except as outlined above       OV 12/12/2016  Chief Complaint  Patient presents with  . Follow-up    Pt states his breathing is unchanged since last OV. Pt c/o prod cough and neck soreness.    Follow-up chronic hypoxemic and hypercapnic respiratory failure,  The presence of his wife. I personally not seen him in one year. In the interim he is seeing  multiple other providers for exacerbation. He says he has intentionally lost weight and is now down to 244 pounds. He is wondering his tracheostomy can come out. He feels his quality-of-life is worse after the tracheostomy that he does acknowledge that it is only main reason that he is living. In the past we've tried we will tracheostomy was "very hypercapnic on capping and the advice and that he needs permanent tracheostomy in the daytime with nighttime ventilator support through the tracheostomy. Now that he's lost weight he is very keen on tracheostomy removal. He complains of significant throat irritation and mucus and random cough all because of local irritation from the tracheostomy. His ENT surgeon Dr. Redmond Baseman does the tracheostomy change in visits with his wife helping.     has a past medical history of Anemia; Angina; Anxiety; Arthritis; CHF (congestive heart failure) ([place]); Chronic airway obstruction, not elsewhere classified; Chronic diastolic CHF (congestive heart failure) ([place])  (10/28/2007); Chronic kidney disease; Chronic pulmonary heart disease, unspecified; Chronic renal insufficiency, stage III (moderate); Chronic respiratory failure (HCC); COPD (chronic obstructive pulmonary disease) ([place]); Coronary atherosclerosis of unspecified type of vessel, native or graft; GERD (gastroesophageal reflux disease); History of gout; Hyperlipidemia; Hypertension; Obstructive sleep apnea (10/28/2007); OSA (obstructive sleep apnea); PVD (peripheral vascular disease) ([place]) (07/13/2011); PVD (peripheral vascular disease) ([place]); S/P CABG (coronary artery bypass graft) (x 6 2000 or 2001); Shortness of breath dyspnea; Swelling of limb; TIA (transient ischemic attack); Type 2 diabetes mellitus with vascular disease ([place]) (07/13/2011); and Unspecified sleep apnea.   reports that he quit smoking about 18 years ago. His smoking use included Cigarettes. He has a 25.00 pack-year smoking history. He has never used smokeless tobacco.  Past Surgical History:  Procedure Laterality Date  . Abdominal Aortogram  12/03/2002   70% stenosis at the origin of the profunda femoris, 75% mid superficial femoral artery stenosis  . APPENDECTOMY    . BILATERAL VATS ABLATION    . CARDIAC CATHETERIZATION  02/16/2010   RCA-80% stenosis stented with a 3x12 Taxus ION DES resulting in reduction of a total occlusion to 0%  . CARDIAC CATHETERIZATION  03/29/2009   Ostial/Proximal PDA-SVG-75% stenosis stented with a 3.0x23 Xience V DES reduction resulting in 75% stenosis to 0%  . CARDIAC CATHETERIZATION  08/05/2004   Circumflex OM stented with a 2.5x13 Cypher stent resulting in reduction of 60% to 0%  . CARDIAC CATHETERIZATION  06/28/2000   Moderate left main and severe three-vessel disease, consider CABG  . CATARACT EXTRACTION    . COLONOSCOPY  08/11/2011   Procedure: COLONOSCOPY;  Surgeon: Missy Sabins, MD;  Location: [place];  Service: Endoscopy;  Laterality: N/A;  . COLONOSCOPY WITH PROPOFOL N/A 09/07/2014    Procedure: COLONOSCOPY WITH PROPOFOL;  Surgeon: Missy Sabins, MD;  Location: [facility];  Service: Endoscopy;  Laterality: N/A;  . CORONARY ANGIOPLASTY WITH STENT PLACEMENT     x 2  . CORONARY ARTERY BYPASS GRAFT  07/12/2000   x6,  . ESOPHAGOGASTRODUODENOSCOPY  08/11/2011   Procedure: ESOPHAGOGASTRODUODENOSCOPY (EGD);  Surgeon: Missy Sabins, MD;  [facility];  Service: Endoscopy;  Laterality: N/A;  . ESOPHAGOGASTRODUODENOSCOPY (EGD) WITH PROPOFOL N/A 09/07/2014   Procedure: ESOPHAGOGASTRODUODENOSCOPY (EGD) WITH PROPOFOL;  Surgeon: Missy Sabins, MD;  Location: [facility];  Service: Endoscopy;  Laterality: N/A;  . hemmorhoids    . lung sx  2005   Growth on outside of right lung  . PTCA  04/16/2008   Left SFA-60% stenosis stented with a 7x6 Nitinolself-expanding stent resulting  in reduction of 60% mid segmental L SFA stenosis to 0% residual  . TRACHEOSTOMY TUBE PLACEMENT N/A 05/23/2013   Procedure: TRACHEOSTOMY;  Surgeon: Melida Quitter, MD;  Location: [place];  Service: ENT;  Laterality: N/A;    Allergies  Allergen Reactions  . Amlodipine Besy-Benazepril Hcl Swelling    LIPS SWELL ANGIOEDEMA  . Percocet [Oxycodone-Acetaminophen] Other (See Comments)    HALLUCINATIONS    Immunization History  Administered Date(s) Administered  . H1N1 09/07/2008  . Influenza Split 05/23/2011, 06/17/2012, 05/05/2014  . Influenza Whole 05/14/2016  . Influenza,inj,Quad PF,36+ Mos 05/22/2013, 05/19/2015    Family History  Problem Relation Age of Onset  . Diabetes Sister   . Heart attack Neg Hx   . Stroke Neg Hx   . Hypertension Sister   . Cancer Sister      Current Outpatient Prescriptions:  .  acetaminophen (TYLENOL) 500 MG tablet, Take 500 mg by mouth every 6 (six) hours as needed for mild pain or moderate pain., Disp: , Rfl:  .  albuterol (PROVENTIL) (2.5 MG/3ML) 0.083% nebulizer solution, Take 3 mLs (2.5 mg total) by nebulization every 4 (four) hours as needed for wheezing or  shortness of breath., Disp: , Rfl:  .  allopurinol (ZYLOPRIM) 100 MG tablet, Take 1 tablet by mouth daily., Disp: , Rfl:  .  arformoterol (BROVANA) 15 MCG/2ML NEBU, Take 2 mLs (15 mcg total) by nebulization 2 (two) times daily., Disp: 75 mL, Rfl: 5 .  aspirin 81 MG tablet, Take 81 mg by mouth daily., Disp: , Rfl:  .  budesonide (PULMICORT) 0.5 MG/2ML nebulizer solution, USE ONE VIAL IN NEBULIZER TWICE DAILY, Disp: 75 mL, Rfl: 5 .  clopidogrel (PLAVIX) 75 MG tablet, Take 1 tablet (75 mg total) by mouth daily., Disp: 30 tablet, Rfl: 6 .  COLCRYS 0.6 MG tablet, Take 0.6 mg by mouth daily as needed. , Disp: , Rfl:  .  dextromethorphan-guaiFENesin (MUCINEX DM) 30-600 MG 12hr tablet, Take 1 tablet by mouth 2 (two) times daily. Patient takes for mucus relief, Disp: , Rfl:  .  furosemide (LASIX) 40 MG tablet, TAKE TWO TABLETS BY MOUTH IN THE MORNING AND ONE TABLET IN THE EVENING, Disp: 90 tablet, Rfl: 9 .  metolazone (ZAROXOLYN) 2.5 MG tablet, TAKE ONE TABLET BY MOUTH ONCE DAILY ON  MONDAY,  WEDNESDAY,  AND  FRIDAY  30  MINUTES  BEFORE  LASIX, Disp: 30 tablet, Rfl: 3 .  nitroGLYCERIN (NITROSTAT) 0.4 MG SL tablet, Place 1 tablet (0.4 mg total) under the tongue every 5 (five) minutes as needed. For chest pain, Disp: 25 tablet, Rfl: 6    Review of Systems     Objective:   Physical Exam  Constitutional: He is oriented to person, place, and time. He appears well-developed and well-nourished. No distress.  HENT:  Head: Normocephalic and atraumatic.  Right Ear: External ear normal.  Left Ear: External ear normal.  Mouth/Throat: Oropharynx is clear and moist. No oropharyngeal exudate.  Trach + Coughs +  Eyes: Conjunctivae and EOM are normal. Pupils are equal, round, and reactive to light. Right eye exhibits no discharge. Left eye exhibits no discharge. No scleral icterus.  Neck: Normal range of motion. Neck supple. No JVD present. No tracheal deviation present. No thyromegaly present.  Cardiovascular:  Normal rate, regular rhythm and intact distal pulses.  Exam reveals no gallop and no friction rub.   No murmur heard. Pulmonary/Chest: Effort normal and breath sounds normal. No respiratory distress. He has no wheezes. He has no  rales. He exhibits no tenderness.  Overall diminished air entry  Abdominal: Soft. Bowel sounds are normal. He exhibits no distension and no mass. There is no tenderness. There is no rebound and no guarding.  Musculoskeletal: Normal range of motion. He exhibits no edema or tenderness.  Lymphadenopathy:    He has no cervical adenopathy.  Neurological: He is alert and oriented to person, place, and time. He has normal reflexes. No cranial nerve deficit. Coordination normal.  Skin: Skin is warm and dry. No rash noted. He is not diaphoretic. No erythema. No pallor.  Psychiatric: He has a normal mood and affect. His behavior is normal. Judgment and thought content normal.  Nursing note and vitals reviewed.  Vitals:   12/12/16 1119  BP: 128/78  Pulse: 99  SpO2: 94%  Weight: 244 lb 6.4 oz (110.9 kg)  Height: 6\' 1"  (1.854 m)    Estimated body mass index is 32.24 kg/m as calculated from the following:   Height as of this encounter: 6\' 1"  (1.854 m).   Weight as of this encounter: 244 lb 6.4 oz (110.9 kg).       Assessment:       ICD-9-CM ICD-10-CM   1. Chronic respiratory failure with hypoxia and hypercapnia (HCC) 518.83 J96.11    799.02 J96.12    786.09         Plan:      Chronic respiratory failure with hypoxia and hypercapnia (HCC) Continue day time trach collar with o2 and night vent support Currently stable disease Continue nebs Congratulation on weight loss  We can think about trach removal when you get down to 220 pounds and definitely more so when you are at 200 pounds  Followup 3 months or sooner if needed  (> 50% of this 15 min visit spent in face to face counseling or/and coordination of care)   Dr. Brand Males, M.D.,  [facility].C.P Pulmonary and Critical Care Medicine Staff Physician [place] [facility] Pager: [phone], If no answer or between  15:00h - 7:00h: call [phone]  12/12/2016 11:41 AM

## 2016-12-12 NOTE — Patient Instructions (Signed)
Chronic respiratory failure with hypoxia and hypercapnia (HCC) Continue day time trach collar with o2 and night vent support Currently stable disease Continue nebs Congratulation on weight loss  We can think about trach removal when you get down to 220 pounds and definitely more so when you are at 200 pounds  Followup 3 months or sooner if needed

## 2016-12-22 ENCOUNTER — Encounter (HOSPITAL_COMMUNITY)
Admission: RE | Admit: 2016-12-22 | Discharge: 2016-12-22 | Disposition: A | Discharge status: Home / Self Care | Payer: Medicare Other | Source: Ambulatory Visit | Attending: Nephrology | Admitting: Nephrology

## 2016-12-22 DIAGNOSIS — N183 Chronic kidney disease, stage 3 unspecified: Secondary | ICD-10-CM

## 2016-12-22 DIAGNOSIS — D631 Anemia in chronic kidney disease: Secondary | ICD-10-CM | POA: Insufficient documentation

## 2016-12-22 LAB — IRON AND TIBC
Iron: 35 ug/dL — ABNORMAL LOW (ref 45–182)
SATURATION RATIOS: 15 % — AB (ref 17.9–39.5)
TIBC: 237 ug/dL — AB (ref 250–450)
UIBC: 202 ug/dL

## 2016-12-22 LAB — URIC ACID: Uric Acid, Serum: 6.7 mg/dL (ref 4.4–7.6)

## 2016-12-22 LAB — FERRITIN: Ferritin: 616 ng/mL — ABNORMAL HIGH (ref 24–336)

## 2016-12-22 LAB — POCT HEMOGLOBIN-HEMACUE: HEMOGLOBIN: 10.1 g/dL — AB (ref 13.0–17.0)

## 2016-12-22 MED ORDER — DARBEPOETIN ALFA 200 MCG/0.4ML IJ SOSY
PREFILLED_SYRINGE | INTRAMUSCULAR | Status: AC
  Filled 2016-12-22: qty 0.4

## 2016-12-22 MED ORDER — DARBEPOETIN ALFA 200 MCG/0.4ML IJ SOSY
200.0000 ug | PREFILLED_SYRINGE | INTRAMUSCULAR | Status: DC
  Administered 2016-12-22: 200 ug via SUBCUTANEOUS

## 2017-01-05 ENCOUNTER — Encounter (HOSPITAL_COMMUNITY)
Admission: RE | Admit: 2017-01-05 | Discharge: 2017-01-05 | Disposition: A | Discharge status: Home / Self Care | Payer: Medicare Other | Source: Ambulatory Visit | Attending: Nephrology | Admitting: Nephrology

## 2017-01-05 DIAGNOSIS — N183 Chronic kidney disease, stage 3 unspecified: Secondary | ICD-10-CM

## 2017-01-05 DIAGNOSIS — D631 Anemia in chronic kidney disease: Secondary | ICD-10-CM | POA: Diagnosis not present

## 2017-01-05 LAB — POCT HEMOGLOBIN-HEMACUE: HEMOGLOBIN: 9.8 g/dL — AB (ref 13.0–17.0)

## 2017-01-05 MED ORDER — DARBEPOETIN ALFA 200 MCG/0.4ML IJ SOSY
200.0000 ug | PREFILLED_SYRINGE | INTRAMUSCULAR | Status: DC
  Administered 2017-01-05: 13:00:00 200 ug via SUBCUTANEOUS

## 2017-01-05 MED ORDER — DARBEPOETIN ALFA 200 MCG/0.4ML IJ SOSY
PREFILLED_SYRINGE | INTRAMUSCULAR | Status: AC
  Filled 2017-01-05: qty 0.4

## 2017-01-19 ENCOUNTER — Encounter (HOSPITAL_COMMUNITY)
Admission: RE | Admit: 2017-01-19 | Discharge: 2017-01-19 | Disposition: A | Discharge status: Home / Self Care | Payer: Medicare Other | Source: Ambulatory Visit | Attending: Nephrology | Admitting: Nephrology

## 2017-01-19 DIAGNOSIS — N183 Chronic kidney disease, stage 3 unspecified: Secondary | ICD-10-CM

## 2017-01-19 DIAGNOSIS — D631 Anemia in chronic kidney disease: Secondary | ICD-10-CM | POA: Insufficient documentation

## 2017-01-19 LAB — URIC ACID: Uric Acid, Serum: 7.3 mg/dL (ref 4.4–7.6)

## 2017-01-19 LAB — RENAL FUNCTION PANEL
Albumin: 3.7 g/dL (ref 3.5–5.0)
Anion gap: 12 (ref 5–15)
BUN: 82 mg/dL — ABNORMAL HIGH (ref 6–20)
CALCIUM: 8.7 mg/dL — AB (ref 8.9–10.3)
CO2: 38 mmol/L — AB (ref 22–32)
CREATININE: 2.36 mg/dL — AB (ref 0.61–1.24)
Chloride: 86 mmol/L — ABNORMAL LOW (ref 101–111)
GFR, EST AFRICAN AMERICAN: 32 mL/min — AB (ref >60)
GFR, EST NON AFRICAN AMERICAN: 27 mL/min — AB (ref >60)
Glucose, Bld: 223 mg/dL — ABNORMAL HIGH (ref 65–99)
Phosphorus: 3.8 mg/dL (ref 2.5–4.6)
Potassium: 3.7 mmol/L (ref 3.5–5.1)
SODIUM: 136 mmol/L (ref 135–145)

## 2017-01-19 LAB — POCT HEMOGLOBIN-HEMACUE: HEMOGLOBIN: 10.2 g/dL — AB (ref 13.0–17.0)

## 2017-01-19 MED ORDER — DARBEPOETIN ALFA 200 MCG/0.4ML IJ SOSY
200.0000 ug | PREFILLED_SYRINGE | INTRAMUSCULAR | Status: DC
  Administered 2017-01-19: 200 ug via SUBCUTANEOUS

## 2017-01-19 MED ORDER — DARBEPOETIN ALFA 200 MCG/0.4ML IJ SOSY
PREFILLED_SYRINGE | INTRAMUSCULAR | Status: AC
  Filled 2017-01-19: qty 0.4

## 2017-02-02 ENCOUNTER — Encounter (HOSPITAL_COMMUNITY)
Admission: RE | Admit: 2017-02-02 | Discharge: 2017-02-02 | Disposition: A | Discharge status: Home / Self Care | Payer: Medicare Other | Source: Ambulatory Visit | Attending: Nephrology | Admitting: Nephrology

## 2017-02-02 DIAGNOSIS — N183 Chronic kidney disease, stage 3 unspecified: Secondary | ICD-10-CM

## 2017-02-02 DIAGNOSIS — D631 Anemia in chronic kidney disease: Secondary | ICD-10-CM | POA: Diagnosis not present

## 2017-02-02 LAB — POCT HEMOGLOBIN-HEMACUE: HEMOGLOBIN: 9.3 g/dL — AB (ref 13.0–17.0)

## 2017-02-02 MED ORDER — DARBEPOETIN ALFA 200 MCG/0.4ML IJ SOSY
200.0000 ug | PREFILLED_SYRINGE | INTRAMUSCULAR | Status: DC
  Administered 2017-02-02: 200 ug via SUBCUTANEOUS

## 2017-02-02 MED ORDER — DARBEPOETIN ALFA 200 MCG/0.4ML IJ SOSY
PREFILLED_SYRINGE | INTRAMUSCULAR | Status: AC
  Administered 2017-02-02: 200 ug via SUBCUTANEOUS
  Filled 2017-02-02: qty 0.4

## 2017-02-09 ENCOUNTER — Other Ambulatory Visit: Payer: Self-pay | Admitting: Cardiovascular Disease

## 2017-02-16 ENCOUNTER — Encounter (HOSPITAL_COMMUNITY)
Admission: RE | Admit: 2017-02-16 | Discharge: 2017-02-16 | Disposition: A | Discharge status: Home / Self Care | Payer: Medicare Other | Source: Ambulatory Visit | Attending: Nephrology | Admitting: Nephrology

## 2017-02-16 DIAGNOSIS — N183 Chronic kidney disease, stage 3 unspecified: Secondary | ICD-10-CM

## 2017-02-16 DIAGNOSIS — D631 Anemia in chronic kidney disease: Secondary | ICD-10-CM | POA: Insufficient documentation

## 2017-02-16 LAB — IRON AND TIBC
Iron: 34 ug/dL — ABNORMAL LOW (ref 45–182)
Saturation Ratios: 13 % — ABNORMAL LOW (ref 17.9–39.5)
TIBC: 267 ug/dL (ref 250–450)
UIBC: 233 ug/dL

## 2017-02-16 LAB — FERRITIN: FERRITIN: 468 ng/mL — AB (ref 24–336)

## 2017-02-16 LAB — POCT HEMOGLOBIN-HEMACUE: HEMOGLOBIN: 9.2 g/dL — AB (ref 13.0–17.0)

## 2017-02-16 LAB — URIC ACID: Uric Acid, Serum: 6.6 mg/dL (ref 4.4–7.6)

## 2017-02-16 MED ORDER — DARBEPOETIN ALFA 200 MCG/0.4ML IJ SOSY
200.0000 ug | PREFILLED_SYRINGE | INTRAMUSCULAR | Status: DC
  Administered 2017-02-16: 200 ug via SUBCUTANEOUS

## 2017-02-16 MED ORDER — DARBEPOETIN ALFA 200 MCG/0.4ML IJ SOSY
PREFILLED_SYRINGE | INTRAMUSCULAR | Status: AC
  Filled 2017-02-16: qty 0.4

## 2017-02-22 ENCOUNTER — Other Ambulatory Visit (HOSPITAL_COMMUNITY): Payer: Self-pay | Admitting: *Deleted

## 2017-02-23 ENCOUNTER — Encounter (HOSPITAL_COMMUNITY)
Admission: RE | Admit: 2017-02-23 | Discharge: 2017-02-23 | Disposition: A | Discharge status: Home / Self Care | Payer: Medicare Other | Source: Ambulatory Visit | Attending: Nephrology | Admitting: Nephrology

## 2017-02-23 DIAGNOSIS — N189 Chronic kidney disease, unspecified: Secondary | ICD-10-CM | POA: Insufficient documentation

## 2017-02-23 DIAGNOSIS — D631 Anemia in chronic kidney disease: Secondary | ICD-10-CM | POA: Diagnosis present

## 2017-02-23 MED ORDER — FERUMOXYTOL INJECTION 510 MG/17 ML
510.0000 mg | INTRAVENOUS | Status: DC
  Administered 2017-02-23: 12:00:00 510 mg via INTRAVENOUS
  Filled 2017-02-23: qty 17

## 2017-02-26 ENCOUNTER — Ambulatory Visit (INDEPENDENT_AMBULATORY_CARE_PROVIDER_SITE_OTHER): Payer: Medicare Other | Admitting: Pulmonary Disease

## 2017-02-26 ENCOUNTER — Ambulatory Visit (INDEPENDENT_AMBULATORY_CARE_PROVIDER_SITE_OTHER)
Admission: RE | Admit: 2017-02-26 | Discharge: 2017-02-26 | Disposition: A | Discharge status: Home / Self Care | Payer: Medicare Other | Source: Ambulatory Visit | Attending: Pulmonary Disease | Admitting: Pulmonary Disease

## 2017-02-26 ENCOUNTER — Encounter: Payer: Self-pay | Admitting: Pulmonary Disease

## 2017-02-26 ENCOUNTER — Telehealth: Payer: Self-pay | Admitting: Internal Medicine

## 2017-02-26 VITALS — BP 122/62 | HR 146 | Ht 73.0 in | Wt 237.8 lb

## 2017-02-26 DIAGNOSIS — J441 Chronic obstructive pulmonary disease with (acute) exacerbation: Secondary | ICD-10-CM

## 2017-02-26 MED ORDER — LEVOFLOXACIN 500 MG PO TABS: 500.0000 mg | ORAL_TABLET | ORAL | 0 refills | Status: DC

## 2017-02-26 NOTE — Patient Instructions (Signed)
Chest xray today  Levaquin 500 mg pill every other day  Follow up with Nurse Practitioner in 1 week

## 2017-02-26 NOTE — Telephone Encounter (Signed)
Spoke with Janett Billow. We can use one of TP's held spots on 03/06/17 in the AM ONLY. lmtcb x1 for pt's wife.

## 2017-02-26 NOTE — Telephone Encounter (Signed)
Spoke with pt's wife. Scheduled with TP on 03/06/17 at 11:00.

## 2017-02-26 NOTE — Telephone Encounter (Signed)
Noted. Will close this message.  

## 2017-02-26 NOTE — Progress Notes (Signed)
Current Outpatient Prescriptions on File Prior to Visit  Medication Sig  . acetaminophen (TYLENOL) 500 MG tablet Take 500 mg by mouth every 6 (six) hours as needed for mild pain or moderate pain.  Marland Kitchen albuterol (PROVENTIL) (2.5 MG/3ML) 0.083% nebulizer solution Take 3 mLs (2.5 mg total) by nebulization every 4 (four) hours as needed for wheezing or shortness of breath.  . allopurinol (ZYLOPRIM) 100 MG tablet Take 1 tablet by mouth daily.  Marland Kitchen arformoterol (BROVANA) 15 MCG/2ML NEBU Take 2 mLs (15 mcg total) by nebulization 2 (two) times daily.  Marland Kitchen aspirin 81 MG tablet Take 81 mg by mouth daily.  . budesonide (PULMICORT) 0.5 MG/2ML nebulizer solution USE ONE VIAL IN NEBULIZER TWICE DAILY  . clopidogrel (PLAVIX) 75 MG tablet TAKE ONE TABLET BY MOUTH ONCE DAILY  . COLCRYS 0.6 MG tablet Take 0.6 mg by mouth daily as needed.   Marland Kitchen dextromethorphan-guaiFENesin (MUCINEX DM) 30-600 MG 12hr tablet Take 1 tablet by mouth 2 (two) times daily. Patient takes for mucus relief  . furosemide (LASIX) 40 MG tablet TAKE TWO TABLETS BY MOUTH IN THE MORNING AND ONE TABLET IN THE EVENING  . metolazone (ZAROXOLYN) 2.5 MG tablet TAKE ONE TABLET BY MOUTH ONCE DAILY ON  MONDAY,  WEDNESDAY,  AND  FRIDAY  30  MINUTES  BEFORE  LASIX  . nitroGLYCERIN (NITROSTAT) 0.4 MG SL tablet Place 1 tablet (0.4 mg total) under the tongue every 5 (five) minutes as needed. For chest pain   No current facility-administered medications on file prior to visit.      Chief Complaint  Patient presents with  . Acute Visit    Pt c/o increased congestion, cough-yellow thick mucus and SOB x 1 week. Pt states that at times he does not get any mucus up and stays very congested in his chest. O2 levels have been high around 98-99%. HR is elevated today around 145bpm. Using 4 liters O2 24/7.  Using neb meds as directed BID and mucinex PRN.      Pulmonary tests PFT 02/24/10 >> FEV1 1.11 (31%), Leak with lung volumes, DLCO 37%, no BD CT chest 06/02/15 >> b/l  pleural thickening, Lt > Rt calcification from loculated pleural fluid, goiter, emphysema  Cardiac tests Echo 05/21/13 >> mild LVH, EF 55 to 60%, grade 1 DD, mild AS  Past medical history, Past surgical history, Family history, Social history, Allergies all reviewed.  Vital Signs BP 122/62 (BP Location: Right Arm, Cuff Size: Normal)   Pulse (!) 146   Ht 6\' 1"  (1.854 m)   Wt 237 lb 12.8 oz (107.9 kg)   SpO2 98%   BMI 31.37 kg/m   History of Present Illness [name] [name] is a 66 y.o. male with COPD, OSA, OHS, chronic respiratory failure.  He is followed by Dr. Chase Caller for his COPD and nocturnal vent.  He is followed by Dr. Redmond Baseman for tracheostomy.  He was seen on July 5 by ENT.  He started getting trouble with cough and thick sputum about 1 week ago.  He has been feeling feverish.  He is not having chest pain, wheeze, abdominal pain, nausea, diarrhea, sinus congestion, or sore throat.  He has been using mucinex.  He gets winded when he has coughing spell.  He is bringing up thick, yellow to green sputum.  It is difficult for him to expectorate.  His wife has tried suctioning him, but this doesn't always clear the phlegm.  He denies hemoptysis.  He uses his nebulizer and this  helps some.   Physical Exam  General - sitting in wheelchair, frequent coughing spells Eyes - pupils reactive ENT - no sinus tenderness, no oral exudate, no LAN, trach site clean with thick yellow to green phlegm Cardiac - regular, no murmur Chest - decreased BS b/l, no wheeze Abd - soft, non tender Ext - 1+ edema Skin - no rashes Neuro - normal strength Psych - normal mood    CMP Latest Ref Rng & Units 01/19/2017 11/20/2016 10/20/2016  Glucose 65 - 99 mg/dL 223(H) 151(H) 98  BUN 6 - 20 mg/dL 82(H) 103(H) 70(H)  Creatinine 0.61 - 1.24 mg/dL 2.36(H) 3.05(H) 2.16(H)  Sodium 135 - 145 mmol/L 136 137 137  Potassium 3.5 - 5.1 mmol/L 3.7 3.9 4.1  Chloride 101 - 111 mmol/L 86(L) 86(L) 87(L)  CO2 22 - 32 mmol/L  38(H) 41(H) 39(H)  Calcium 8.9 - 10.3 mg/dL 8.7(L) 8.8(L) 9.2  Total Protein 6.5 - 8.1 g/dL - - -  Total Bilirubin 0.3 - 1.2 mg/dL - - -  Alkaline Phos 38 - 126 U/L - - -  AST 15 - 41 U/L - - -  ALT 17 - 63 U/L - - -    CBC Latest Ref Rng & Units 02/16/2017 02/02/2017 01/19/2017  WBC 4.0 - 10.5 K/uL - - -  Hemoglobin 13.0 - 17.0 g/dL 9.2(L) 9.3(L) 10.2(L)  Hematocrit 39.0 - 52.0 % - - -  Platelets 150 - 400 K/uL - - -    ABG    Component Value Date/Time   PHART 7.502 (H) 06/04/2015 0438   PCO2ART 50.2 (H) 06/04/2015 0438   PO2ART 182 (H) 06/04/2015 0438   HCO3 39.0 (H) 06/04/2015 0438   TCO2 40.6 06/04/2015 0438   O2SAT 99.8 06/04/2015 0438    Assessment/Plan  Acute COPD exacerbation. - will give course of levaquin >> renal dose adjust - f/u CXR - defer prednisone at this time - continue nebulizer tx - continue mucinex   Patient Instructions  Chest xray today  Levaquin 500 mg pill every other day  Follow up with Nurse Practitioner in 1 week    Chesley Mires, MD  Pulmonary/Critical Care/Sleep Pager:  [phone] 02/26/2017, 11:40 AM

## 2017-02-27 ENCOUNTER — Telehealth: Payer: Self-pay | Admitting: Pulmonary Disease

## 2017-02-27 NOTE — Telephone Encounter (Signed)
Dg Chest 2 View  Result Date: 02/26/2017 CLINICAL DATA:  Cough and congestion . EXAM: CHEST  2 VIEW COMPARISON:  08/09/2016 01/22/2015.  07/17/2014. FINDINGS: Prior CABG. Cardiomegaly with diffuse bilateral pulmonary interstitial prominence and bilateral pleural effusions consistent CHF again noted. A chronic interstitial process cannot be excluded. No pneumothorax . IMPRESSION: Prior CABG. Cardiomegaly with diffuse bilateral from interstitial prominence and bilateral pleural effusions again noted consistent CHF. Similar findings noted on prior exams. A chronic interstitial process cannot be excluded . Electronically Signed   By: Marcello Moores  Register   On: 02/26/2017 13:33     Will have my nurse inform pt that CXR did not show evidence for pneumonia.  No change to current tx plan.

## 2017-02-27 NOTE — Telephone Encounter (Signed)
ATC; message was left for patient to contact office for results.

## 2017-03-02 ENCOUNTER — Encounter (HOSPITAL_COMMUNITY)
Admission: RE | Admit: 2017-03-02 | Discharge: 2017-03-02 | Disposition: A | Discharge status: Home / Self Care | Payer: Medicare Other | Source: Ambulatory Visit | Attending: Nephrology | Admitting: Nephrology

## 2017-03-02 DIAGNOSIS — N183 Chronic kidney disease, stage 3 unspecified: Secondary | ICD-10-CM

## 2017-03-02 DIAGNOSIS — D631 Anemia in chronic kidney disease: Secondary | ICD-10-CM | POA: Insufficient documentation

## 2017-03-02 MED ORDER — DARBEPOETIN ALFA 200 MCG/0.4ML IJ SOSY
200.0000 ug | PREFILLED_SYRINGE | INTRAMUSCULAR | Status: DC
  Administered 2017-03-02: 13:00:00 200 ug via SUBCUTANEOUS

## 2017-03-02 MED ORDER — FERUMOXYTOL INJECTION 510 MG/17 ML
510.0000 mg | INTRAVENOUS | Status: DC
  Administered 2017-03-02: 13:00:00 510 mg via INTRAVENOUS
  Filled 2017-03-02: qty 17

## 2017-03-02 MED ORDER — DARBEPOETIN ALFA 200 MCG/0.4ML IJ SOSY
PREFILLED_SYRINGE | INTRAMUSCULAR | Status: AC
  Filled 2017-03-02: qty 0.4

## 2017-03-02 NOTE — Telephone Encounter (Signed)
Spoke with pt's wife, Stanton Kidney. She is aware of his CXR results. Nothing further was needed.

## 2017-03-05 LAB — POCT HEMOGLOBIN-HEMACUE: HEMOGLOBIN: 9.1 g/dL — AB (ref 13.0–17.0)

## 2017-03-06 ENCOUNTER — Encounter: Payer: Self-pay | Admitting: Adult Health

## 2017-03-06 ENCOUNTER — Ambulatory Visit (INDEPENDENT_AMBULATORY_CARE_PROVIDER_SITE_OTHER): Payer: Medicare Other | Admitting: Adult Health

## 2017-03-06 DIAGNOSIS — J9612 Chronic respiratory failure with hypercapnia: Secondary | ICD-10-CM | POA: Diagnosis not present

## 2017-03-06 DIAGNOSIS — J441 Chronic obstructive pulmonary disease with (acute) exacerbation: Secondary | ICD-10-CM | POA: Diagnosis not present

## 2017-03-06 DIAGNOSIS — J9611 Chronic respiratory failure with hypoxia: Secondary | ICD-10-CM

## 2017-03-06 DIAGNOSIS — G4733 Obstructive sleep apnea (adult) (pediatric): Secondary | ICD-10-CM | POA: Diagnosis not present

## 2017-03-06 NOTE — Assessment & Plan Note (Signed)
Cont w/ trach , noct vent and trach collar

## 2017-03-06 NOTE — Assessment & Plan Note (Signed)
Cont w/ trach care and nocturnal vent

## 2017-03-06 NOTE — Progress Notes (Signed)
@Patient  ID: [name] [name], male    DOB: 10/09/1950, 66 y.o.   MRN: [id number]  Chief Complaint  Patient presents with  . Follow-up    COPD     Referring provider: Nolene Ebbs, MD  HPI: 66 year old male former smoker followed for COPD, obstructive sleep apnea, obesity hypoventilation syndrome, chronic respiratory failure, trach dependent Patient is on nocturnal vent Has CKD - followed by Nephrology    Pulmonary tests PFT 02/24/10 >> FEV1 1.11 (31%), Leak with lung volumes, DLCO 37%, no BD CT chest 06/02/15 >> b/l pleural thickening, Lt > Rt calcification from loculated pleural fluid, goiter, emphysema  Cardiac tests Echo 05/21/13 >> mild LVH, EF 55 to 60%, grade 1 DD, mild AS  03/06/2017 Follow up ; COPD /O2 RF , Trach /Vent dependent Patient returns for one-week follow-up. Patient was seen last week in the office for COPD exacerbation. Patient was treated with Levaquin 1 week. Patient reports he is feeling better with decreased cough and congestion . Mucus remains thick and clear .  Decreased dyspnea but not quite back to baseline . Had to use wheelchair last week , today was able to walk in to exam room .  Chest x-ray done last week showed no evidence of acute pneumonia. He had chronic changes noted. He remains on Pulmicort and Brovana nebulizer twice daily.   Allergies  Allergen Reactions  . Amlodipine Besy-Benazepril Hcl Swelling    LIPS SWELL ANGIOEDEMA  . Percocet [Oxycodone-Acetaminophen] Other (See Comments)    HALLUCINATIONS    Immunization History  Administered Date(s) Administered  . H1N1 09/07/2008  . Influenza Split 05/23/2011, 06/17/2012, 05/05/2014  . Influenza Whole 05/14/2016  . Influenza,inj,Quad PF,36+ Mos 05/22/2013, 05/19/2015    Past Medical History:  Diagnosis Date  . Anemia   . Angina   . Anxiety   . Arthritis    gout  . CHF (congestive heart failure) ([place])   . Chronic airway obstruction, not elsewhere classified   . Chronic  diastolic CHF (congestive heart failure) ([place]) 10/28/2007   Hospitalized with diastolic CHF controlled with diuresis and BiPAP 2D April 2012 EF 55-60 with severe LVH and grade 1 diastolic dysfunction    . Chronic kidney disease    sees dr Louanna Raw every 6 months  . Chronic pulmonary heart disease, unspecified   . Chronic renal insufficiency, stage III (moderate)   . Chronic respiratory failure ([place])   . COPD (chronic obstructive pulmonary disease) ([place])   . Coronary atherosclerosis of unspecified type of vessel, native or graft   . GERD (gastroesophageal reflux disease)   . History of gout   . Hyperlipidemia   . Hypertension   . Obstructive sleep apnea 10/28/2007  . OSA (obstructive sleep apnea)   . PVD (peripheral vascular disease) ([place]) 07/13/2011   S/P Lt SFA PTA Sept 2009, known occluded Rt SFA   . PVD (peripheral vascular disease) ([place])   . S/P CABG (coronary artery bypass graft) x 6 2000 or 2001   Nuc, 09/17/2012 - Significant ST abnormalities that are suggestive of ischemia, low risk nuclear study  . Shortness of breath dyspnea    with activity  . Swelling of limb    Venous Duplex, 09/03/2012 - no evidence of thrombus or thrombophelitis  . TIA (transient ischemic attack)    2D Echo, 12/05/2010 - EF 55-60%, severe LVH  . Type 2 diabetes mellitus with vascular disease ([place]) 07/13/2011   Type 2   . Unspecified sleep apnea    uses oxygen 4  liter per minute all the time by trach    Tobacco History: History  Smoking Status  . Former Smoker  . Packs/day: 1.00  . Years: 25.00  . Types: Cigarettes  . Quit date: 08/14/1998  Smokeless Tobacco  . Never Used   Counseling given: Not Answered   Outpatient Encounter Prescriptions as of 03/06/2017  Medication Sig  . acetaminophen (TYLENOL) 500 MG tablet Take 500 mg by mouth every 6 (six) hours as needed for mild pain or moderate pain.  Marland Kitchen albuterol (PROVENTIL) (2.5 MG/3ML) 0.083% nebulizer solution Take 3 mLs (2.5 mg total) by  nebulization every 4 (four) hours as needed for wheezing or shortness of breath.  . allopurinol (ZYLOPRIM) 100 MG tablet Take 1 tablet by mouth daily.  Marland Kitchen arformoterol (BROVANA) 15 MCG/2ML NEBU Take 2 mLs (15 mcg total) by nebulization 2 (two) times daily.  Marland Kitchen aspirin 81 MG tablet Take 81 mg by mouth daily.  . budesonide (PULMICORT) 0.5 MG/2ML nebulizer solution USE ONE VIAL IN NEBULIZER TWICE DAILY  . clopidogrel (PLAVIX) 75 MG tablet TAKE ONE TABLET BY MOUTH ONCE DAILY  . COLCRYS 0.6 MG tablet Take 0.6 mg by mouth daily as needed.   Marland Kitchen dextromethorphan-guaiFENesin (MUCINEX DM) 30-600 MG 12hr tablet Take 1 tablet by mouth 2 (two) times daily. Patient takes for mucus relief  . furosemide (LASIX) 40 MG tablet TAKE TWO TABLETS BY MOUTH IN THE MORNING AND ONE TABLET IN THE EVENING  . metolazone (ZAROXOLYN) 2.5 MG tablet TAKE ONE TABLET BY MOUTH ONCE DAILY ON  MONDAY,  WEDNESDAY,  AND  FRIDAY  30  MINUTES  BEFORE  LASIX  . nitroGLYCERIN (NITROSTAT) 0.4 MG SL tablet Place 1 tablet (0.4 mg total) under the tongue every 5 (five) minutes as needed. For chest pain  . [DISCONTINUED] levofloxacin (LEVAQUIN) 500 MG tablet Take 1 tablet (500 mg total) by mouth every other day. (Patient not taking: Reported on 03/06/2017)   No facility-administered encounter medications on file as of 03/06/2017.      Review of Systems  Constitutional:   No  weight loss, night sweats,  Fevers, chills +, fatigue, or  lassitude.  HEENT:   No headaches,  Difficulty swallowing,  Tooth/dental problems, or  Sore throat,                No sneezing, itching, ear ache, nasal congestion, post nasal drip,   CV:  No chest pain,  Orthopnea, PND, swelling in lower extremities, anasarca, dizziness, palpitations, syncope.   GI  No heartburn, indigestion, abdominal pain, nausea, vomiting, diarrhea, change in bowel habits, loss of appetite, bloody stools.   Resp:  .  No wheezing.  No chest wall deformity  Skin: no rash or lesions.  GU:  no dysuria, change in color of urine, no urgency or frequency.  No flank pain, no hematuria   MS:  No joint pain or swelling.  No decreased range of motion.  No back pain.    Physical Exam  BP (!) 108/54 (BP Location: Right Arm, Cuff Size: Normal)   Pulse (!) 55   SpO2 97%   GEN: A/Ox3; pleasant , NAD, obese    HEENT:  [place]/AT,  EACs-clear, TMs-wnl, NOSE-clear, THROAT-clear, no lesions, no postnasal drip or exudate noted.   NECK:  Supple w/ fair ROM; no JVD; normal carotid impulses w/o bruits; no thyromegaly or nodules palpated; no lymphadenopathy.  Trach midline with clean dressing   RESP  Clear  P & A; w/o, wheezes/ rales/ or rhonchi. no  accessory muscle use, no dullness to percussion  CARD:  RRR, no m/r/g, tr peripheral edema, pulses intact, no cyanosis or clubbing.  GI:   Soft & nt; nml bowel sounds; no organomegaly or masses detected.   Musco: Warm bil, no deformities or joint swelling noted.   Neuro: alert, no focal deficits noted.    Skin: Warm, no lesions or rashes    Lab Results:   BMET  BNP Imaging: Dg Chest 2 View  Result Date: 02/26/2017 CLINICAL DATA:  Cough and congestion . EXAM: CHEST  2 VIEW COMPARISON:  08/09/2016 01/22/2015.  07/17/2014. FINDINGS: Prior CABG. Cardiomegaly with diffuse bilateral pulmonary interstitial prominence and bilateral pleural effusions consistent CHF again noted. A chronic interstitial process cannot be excluded. No pneumothorax . IMPRESSION: Prior CABG. Cardiomegaly with diffuse bilateral from interstitial prominence and bilateral pleural effusions again noted consistent CHF. Similar findings noted on prior exams. A chronic interstitial process cannot be excluded . Electronically Signed   By: Marcello Moores  Register   On: 02/26/2017 13:33     Assessment & Plan:   COPD (chronic obstructive pulmonary disease) ([place]) Recent flare now resolving   Plan  Patient Instructions  Continue on Trach Collar during daytime  Continue on Vent  support At bedtime   May try Mucinex liquid As needed  Congestion .  Follow up Dr. Chase Caller in  2-3 weeks as planned and As needed Continue on Pulmicort and Brovana nebs Twice daily  .  Marland Kitchen  Please contact office for sooner follow up if symptoms do not improve or worsen or seek emergency care       OSA (obstructive sleep apnea) Cont w/ trach care and nocturnal vent   Chronic respiratory failure with hypoxia and hypercapnia (HCC) Cont w/ trach , noct vent and trach collar      Rexene Edison, NP 03/06/2017

## 2017-03-06 NOTE — Assessment & Plan Note (Signed)
Recent flare now resolving   Plan  Patient Instructions  Continue on Trach Collar during daytime  Continue on Vent support At bedtime   May try Mucinex liquid As needed  Congestion .  Follow up Dr. Chase Caller in  2-3 weeks as planned and As needed Continue on Pulmicort and Brovana nebs Twice daily  .  Marland Kitchen  Please contact office for sooner follow up if symptoms do not improve or worsen or seek emergency care

## 2017-03-06 NOTE — Patient Instructions (Signed)
Continue on Trach Collar during daytime  Continue on Vent support At bedtime   May try Mucinex liquid As needed  Congestion .  Follow up Dr. Chase Caller in  2-3 weeks as planned and As needed Continue on Pulmicort and Brovana nebs Twice daily  .  Marland Kitchen  Please contact office for sooner follow up if symptoms do not improve or worsen or seek emergency care

## 2017-03-14 ENCOUNTER — Other Ambulatory Visit: Payer: Self-pay | Admitting: Cardiovascular Disease

## 2017-03-16 ENCOUNTER — Encounter (HOSPITAL_COMMUNITY)
Admission: RE | Admit: 2017-03-16 | Discharge: 2017-03-16 | Disposition: A | Discharge status: Home / Self Care | Payer: Medicare Other | Source: Ambulatory Visit | Attending: Nephrology | Admitting: Nephrology

## 2017-03-16 ENCOUNTER — Encounter (HOSPITAL_COMMUNITY): Payer: Self-pay

## 2017-03-16 DIAGNOSIS — D631 Anemia in chronic kidney disease: Secondary | ICD-10-CM | POA: Diagnosis present

## 2017-03-16 DIAGNOSIS — N183 Chronic kidney disease, stage 3 unspecified: Secondary | ICD-10-CM

## 2017-03-16 LAB — RENAL FUNCTION PANEL
Albumin: 3.4 g/dL — ABNORMAL LOW (ref 3.5–5.0)
Anion gap: 12 (ref 5–15)
BUN: 71 mg/dL — ABNORMAL HIGH (ref 6–20)
CHLORIDE: 85 mmol/L — AB (ref 101–111)
CO2: 41 mmol/L — AB (ref 22–32)
CREATININE: 2.52 mg/dL — AB (ref 0.61–1.24)
Calcium: 9.1 mg/dL (ref 8.9–10.3)
GFR, EST AFRICAN AMERICAN: 29 mL/min — AB (ref >60)
GFR, EST NON AFRICAN AMERICAN: 25 mL/min — AB (ref >60)
Glucose, Bld: 178 mg/dL — ABNORMAL HIGH (ref 65–99)
POTASSIUM: 3.8 mmol/L (ref 3.5–5.1)
Phosphorus: 3.3 mg/dL (ref 2.5–4.6)
Sodium: 138 mmol/L (ref 135–145)

## 2017-03-16 LAB — POCT HEMOGLOBIN-HEMACUE: HEMOGLOBIN: 9 g/dL — AB (ref 13.0–17.0)

## 2017-03-16 LAB — URIC ACID: URIC ACID, SERUM: 7.2 mg/dL (ref 4.4–7.6)

## 2017-03-16 MED ORDER — DARBEPOETIN ALFA 200 MCG/0.4ML IJ SOSY
PREFILLED_SYRINGE | INTRAMUSCULAR | Status: AC
  Administered 2017-03-16: 14:00:00 200 ug via SUBCUTANEOUS
  Filled 2017-03-16: qty 0.4

## 2017-03-16 MED ORDER — DARBEPOETIN ALFA 200 MCG/0.4ML IJ SOSY
200.0000 ug | PREFILLED_SYRINGE | INTRAMUSCULAR | Status: DC
  Administered 2017-03-16: 200 ug via SUBCUTANEOUS

## 2017-03-20 ENCOUNTER — Ambulatory Visit: Payer: Medicare Other | Admitting: Internal Medicine

## 2017-03-28 ENCOUNTER — Other Ambulatory Visit (HOSPITAL_COMMUNITY): Payer: Self-pay | Admitting: *Deleted

## 2017-03-29 ENCOUNTER — Encounter (HOSPITAL_COMMUNITY): Payer: Medicare Other

## 2017-04-18 ENCOUNTER — Other Ambulatory Visit: Payer: Self-pay

## 2017-04-18 ENCOUNTER — Inpatient Hospital Stay (HOSPITAL_COMMUNITY)
Admission: EM | Admit: 2017-04-18 | Discharge: 2017-04-21 | DRG: 872 | Disposition: A | Discharge status: Home / Self Care | Payer: Medicare Other | Attending: Family Medicine | Admitting: Family Medicine

## 2017-04-18 ENCOUNTER — Other Ambulatory Visit (HOSPITAL_COMMUNITY): Payer: Self-pay

## 2017-04-18 ENCOUNTER — Emergency Department (HOSPITAL_COMMUNITY): Payer: Medicare Other

## 2017-04-18 ENCOUNTER — Encounter (HOSPITAL_COMMUNITY): Payer: Self-pay | Admitting: *Deleted

## 2017-04-18 DIAGNOSIS — E1159 Type 2 diabetes mellitus with other circulatory complications: Secondary | ICD-10-CM

## 2017-04-18 DIAGNOSIS — Z7982 Long term (current) use of aspirin: Secondary | ICD-10-CM

## 2017-04-18 DIAGNOSIS — J9612 Chronic respiratory failure with hypercapnia: Secondary | ICD-10-CM | POA: Diagnosis present

## 2017-04-18 DIAGNOSIS — D649 Anemia, unspecified: Secondary | ICD-10-CM | POA: Diagnosis present

## 2017-04-18 DIAGNOSIS — I130 Hypertensive heart and chronic kidney disease with heart failure and stage 1 through stage 4 chronic kidney disease, or unspecified chronic kidney disease: Secondary | ICD-10-CM | POA: Diagnosis present

## 2017-04-18 DIAGNOSIS — Z930 Tracheostomy status: Secondary | ICD-10-CM | POA: Diagnosis not present

## 2017-04-18 DIAGNOSIS — E1151 Type 2 diabetes mellitus with diabetic peripheral angiopathy without gangrene: Secondary | ICD-10-CM | POA: Diagnosis present

## 2017-04-18 DIAGNOSIS — Z885 Allergy status to narcotic agent status: Secondary | ICD-10-CM

## 2017-04-18 DIAGNOSIS — Z888 Allergy status to other drugs, medicaments and biological substances status: Secondary | ICD-10-CM

## 2017-04-18 DIAGNOSIS — I959 Hypotension, unspecified: Secondary | ICD-10-CM | POA: Diagnosis present

## 2017-04-18 DIAGNOSIS — Z7951 Long term (current) use of inhaled steroids: Secondary | ICD-10-CM

## 2017-04-18 DIAGNOSIS — R000 Tachycardia, unspecified: Secondary | ICD-10-CM | POA: Diagnosis present

## 2017-04-18 DIAGNOSIS — I5032 Chronic diastolic (congestive) heart failure: Secondary | ICD-10-CM | POA: Diagnosis present

## 2017-04-18 DIAGNOSIS — I350 Nonrheumatic aortic (valve) stenosis: Secondary | ICD-10-CM | POA: Diagnosis present

## 2017-04-18 DIAGNOSIS — I248 Other forms of acute ischemic heart disease: Secondary | ICD-10-CM | POA: Diagnosis present

## 2017-04-18 DIAGNOSIS — J441 Chronic obstructive pulmonary disease with (acute) exacerbation: Secondary | ICD-10-CM | POA: Diagnosis not present

## 2017-04-18 DIAGNOSIS — Z951 Presence of aortocoronary bypass graft: Secondary | ICD-10-CM | POA: Diagnosis not present

## 2017-04-18 DIAGNOSIS — Z9911 Dependence on respirator [ventilator] status: Secondary | ICD-10-CM

## 2017-04-18 DIAGNOSIS — I4892 Unspecified atrial flutter: Secondary | ICD-10-CM | POA: Diagnosis present

## 2017-04-18 DIAGNOSIS — D509 Iron deficiency anemia, unspecified: Secondary | ICD-10-CM | POA: Diagnosis present

## 2017-04-18 DIAGNOSIS — I4891 Unspecified atrial fibrillation: Secondary | ICD-10-CM | POA: Diagnosis present

## 2017-04-18 DIAGNOSIS — N183 Chronic kidney disease, stage 3 unspecified: Secondary | ICD-10-CM | POA: Diagnosis present

## 2017-04-18 DIAGNOSIS — I4510 Unspecified right bundle-branch block: Secondary | ICD-10-CM | POA: Diagnosis present

## 2017-04-18 DIAGNOSIS — N3000 Acute cystitis without hematuria: Secondary | ICD-10-CM

## 2017-04-18 DIAGNOSIS — J449 Chronic obstructive pulmonary disease, unspecified: Secondary | ICD-10-CM | POA: Diagnosis present

## 2017-04-18 DIAGNOSIS — Z87891 Personal history of nicotine dependence: Secondary | ICD-10-CM

## 2017-04-18 DIAGNOSIS — R748 Abnormal levels of other serum enzymes: Secondary | ICD-10-CM | POA: Diagnosis present

## 2017-04-18 DIAGNOSIS — I25110 Atherosclerotic heart disease of native coronary artery with unstable angina pectoris: Secondary | ICD-10-CM | POA: Diagnosis not present

## 2017-04-18 DIAGNOSIS — N184 Chronic kidney disease, stage 4 (severe): Secondary | ICD-10-CM | POA: Diagnosis present

## 2017-04-18 DIAGNOSIS — J9611 Chronic respiratory failure with hypoxia: Secondary | ICD-10-CM | POA: Diagnosis present

## 2017-04-18 DIAGNOSIS — Z833 Family history of diabetes mellitus: Secondary | ICD-10-CM

## 2017-04-18 DIAGNOSIS — Z8673 Personal history of transient ischemic attack (TIA), and cerebral infarction without residual deficits: Secondary | ICD-10-CM

## 2017-04-18 DIAGNOSIS — E785 Hyperlipidemia, unspecified: Secondary | ICD-10-CM | POA: Diagnosis present

## 2017-04-18 DIAGNOSIS — J439 Emphysema, unspecified: Secondary | ICD-10-CM | POA: Diagnosis present

## 2017-04-18 DIAGNOSIS — Z955 Presence of coronary angioplasty implant and graft: Secondary | ICD-10-CM

## 2017-04-18 DIAGNOSIS — A419 Sepsis, unspecified organism: Secondary | ICD-10-CM | POA: Diagnosis present

## 2017-04-18 DIAGNOSIS — E1122 Type 2 diabetes mellitus with diabetic chronic kidney disease: Secondary | ICD-10-CM | POA: Diagnosis present

## 2017-04-18 DIAGNOSIS — R011 Cardiac murmur, unspecified: Secondary | ICD-10-CM | POA: Diagnosis present

## 2017-04-18 DIAGNOSIS — I279 Pulmonary heart disease, unspecified: Secondary | ICD-10-CM | POA: Diagnosis present

## 2017-04-18 DIAGNOSIS — I2510 Atherosclerotic heart disease of native coronary artery without angina pectoris: Secondary | ICD-10-CM | POA: Diagnosis not present

## 2017-04-18 DIAGNOSIS — Z8249 Family history of ischemic heart disease and other diseases of the circulatory system: Secondary | ICD-10-CM

## 2017-04-18 DIAGNOSIS — N390 Urinary tract infection, site not specified: Secondary | ICD-10-CM | POA: Diagnosis present

## 2017-04-18 DIAGNOSIS — Z7902 Long term (current) use of antithrombotics/antiplatelets: Secondary | ICD-10-CM

## 2017-04-18 DIAGNOSIS — Z79899 Other long term (current) drug therapy: Secondary | ICD-10-CM

## 2017-04-18 DIAGNOSIS — F419 Anxiety disorder, unspecified: Secondary | ICD-10-CM | POA: Diagnosis present

## 2017-04-18 DIAGNOSIS — K219 Gastro-esophageal reflux disease without esophagitis: Secondary | ICD-10-CM | POA: Diagnosis present

## 2017-04-18 DIAGNOSIS — G4733 Obstructive sleep apnea (adult) (pediatric): Secondary | ICD-10-CM

## 2017-04-18 DIAGNOSIS — Z9981 Dependence on supplemental oxygen: Secondary | ICD-10-CM

## 2017-04-18 LAB — CBC
HCT: 27.9 % — ABNORMAL LOW (ref 39.0–52.0)
Hemoglobin: 7.5 g/dL — ABNORMAL LOW (ref 13.0–17.0)
MCH: 25.6 pg — ABNORMAL LOW (ref 26.0–34.0)
MCHC: 26.9 g/dL — ABNORMAL LOW (ref 30.0–36.0)
MCV: 95.2 fL (ref 78.0–100.0)
PLATELETS: 138 10*3/uL — AB (ref 150–400)
RBC: 2.93 MIL/uL — AB (ref 4.22–5.81)
RDW: 14.5 % (ref 11.5–15.5)
WBC: 4.5 10*3/uL (ref 4.0–10.5)

## 2017-04-18 LAB — URINALYSIS, ROUTINE W REFLEX MICROSCOPIC
BILIRUBIN URINE: NEGATIVE
Glucose, UA: NEGATIVE mg/dL
Ketones, ur: NEGATIVE mg/dL
NITRITE: NEGATIVE
PH: 7 (ref 5.0–8.0)
Protein, ur: 100 mg/dL — AB
SPECIFIC GRAVITY, URINE: 1.011 (ref 1.005–1.030)
Squamous Epithelial / LPF: NONE SEEN

## 2017-04-18 LAB — HEPATIC FUNCTION PANEL
ALBUMIN: 3.6 g/dL (ref 3.5–5.0)
ALT: 9 U/L — ABNORMAL LOW (ref 17–63)
AST: 24 U/L (ref 15–41)
Alkaline Phosphatase: 71 U/L (ref 38–126)
Bilirubin, Direct: 0.2 mg/dL (ref 0.1–0.5)
Indirect Bilirubin: 0.8 mg/dL (ref 0.3–0.9)
Total Bilirubin: 1 mg/dL (ref 0.3–1.2)
Total Protein: 7.3 g/dL (ref 6.5–8.1)

## 2017-04-18 LAB — BASIC METABOLIC PANEL
Anion gap: 12 (ref 5–15)
BUN: 78 mg/dL — ABNORMAL HIGH (ref 6–20)
CALCIUM: 9 mg/dL (ref 8.9–10.3)
CO2: 45 mmol/L — ABNORMAL HIGH (ref 22–32)
CREATININE: 2.36 mg/dL — AB (ref 0.61–1.24)
Chloride: 81 mmol/L — ABNORMAL LOW (ref 101–111)
GFR calc non Af Amer: 27 mL/min — ABNORMAL LOW (ref >60)
GFR, EST AFRICAN AMERICAN: 32 mL/min — AB (ref >60)
Glucose, Bld: 81 mg/dL (ref 65–99)
Potassium: 4 mmol/L (ref 3.5–5.1)
SODIUM: 138 mmol/L (ref 135–145)

## 2017-04-18 LAB — I-STAT TROPONIN, ED: TROPONIN I, POC: 0.02 ng/mL (ref 0.00–0.08)

## 2017-04-18 LAB — LIPASE, BLOOD: Lipase: 31 U/L (ref 11–51)

## 2017-04-18 LAB — I-STAT CG4 LACTIC ACID, ED: LACTIC ACID, VENOUS: 1.61 mmol/L (ref 0.5–1.9)

## 2017-04-18 MED ORDER — ZOLPIDEM TARTRATE 5 MG PO TABS: 5.0000 mg | ORAL_TABLET | Freq: Every evening | ORAL | Status: DC | PRN

## 2017-04-18 MED ORDER — DEXTROSE 5 % IV SOLN
1.0000 g | Freq: Once | INTRAVENOUS | Status: DC
  Filled 2017-04-18: qty 10

## 2017-04-18 MED ORDER — ASPIRIN EC 81 MG PO TBEC
81.0000 mg | DELAYED_RELEASE_TABLET | Freq: Every day | ORAL | Status: DC
  Administered 2017-04-19: 81 mg via ORAL
  Filled 2017-04-18: qty 1

## 2017-04-18 MED ORDER — SODIUM CHLORIDE 0.9 % IV SOLN
INTRAVENOUS | Status: DC
  Administered 2017-04-18 – 2017-04-19 (×2): via INTRAVENOUS

## 2017-04-18 MED ORDER — ONDANSETRON HCL 4 MG/2ML IJ SOLN: 4.0000 mg | Freq: Four times a day (QID) | INTRAMUSCULAR | Status: DC | PRN

## 2017-04-18 MED ORDER — DM-GUAIFENESIN ER 30-600 MG PO TB12
1.0000 | ORAL_TABLET | Freq: Two times a day (BID) | ORAL | Status: DC
  Administered 2017-04-19 – 2017-04-21 (×6): 1 via ORAL
  Filled 2017-04-18 (×6): qty 1

## 2017-04-18 MED ORDER — BUDESONIDE 0.5 MG/2ML IN SUSP
0.5000 mg | Freq: Two times a day (BID) | RESPIRATORY_TRACT | Status: DC
  Administered 2017-04-19 – 2017-04-21 (×5): 0.5 mg via RESPIRATORY_TRACT
  Filled 2017-04-18 (×5): qty 2

## 2017-04-18 MED ORDER — SODIUM CHLORIDE 0.9 % IV BOLUS (SEPSIS)
500.0000 mL | Freq: Once | INTRAVENOUS | Status: AC
  Administered 2017-04-18: 500 mL via INTRAVENOUS

## 2017-04-18 MED ORDER — DEXTROSE 5 % IV SOLN
1.0000 g | Freq: Once | INTRAVENOUS | Status: AC
  Administered 2017-04-18: 1 g via INTRAVENOUS
  Filled 2017-04-18: qty 10

## 2017-04-18 MED ORDER — ALLOPURINOL 100 MG PO TABS
100.0000 mg | ORAL_TABLET | Freq: Every day | ORAL | Status: DC
  Administered 2017-04-19 – 2017-04-21 (×3): 100 mg via ORAL
  Filled 2017-04-18 (×3): qty 1

## 2017-04-18 MED ORDER — ALBUTEROL SULFATE (2.5 MG/3ML) 0.083% IN NEBU: 2.5000 mg | INHALATION_SOLUTION | RESPIRATORY_TRACT | Status: DC | PRN

## 2017-04-18 MED ORDER — ADENOSINE 6 MG/2ML IV SOLN: 6.0000 mg | Freq: Once | INTRAVENOUS | Status: DC

## 2017-04-18 MED ORDER — CLOPIDOGREL BISULFATE 75 MG PO TABS
75.0000 mg | ORAL_TABLET | Freq: Every day | ORAL | Status: DC
  Administered 2017-04-19: 75 mg via ORAL
  Filled 2017-04-18: qty 1

## 2017-04-18 MED ORDER — HEPARIN SODIUM (PORCINE) 5000 UNIT/ML IJ SOLN
5000.0000 [IU] | Freq: Three times a day (TID) | INTRAMUSCULAR | Status: DC
  Administered 2017-04-19 (×2): 5000 [IU] via SUBCUTANEOUS
  Filled 2017-04-18 (×3): qty 1

## 2017-04-18 MED ORDER — ARFORMOTEROL TARTRATE 15 MCG/2ML IN NEBU
15.0000 ug | INHALATION_SOLUTION | Freq: Two times a day (BID) | RESPIRATORY_TRACT | Status: DC
  Administered 2017-04-19 – 2017-04-21 (×5): 15 ug via RESPIRATORY_TRACT
  Filled 2017-04-18 (×6): qty 2

## 2017-04-18 MED ORDER — ACETAMINOPHEN 500 MG PO TABS: 500.0000 mg | ORAL_TABLET | Freq: Four times a day (QID) | ORAL | Status: DC | PRN

## 2017-04-18 MED ORDER — DEXTROSE 5 % IV SOLN: 1.0000 g | Freq: Once | INTRAVENOUS | Status: DC

## 2017-04-18 MED ORDER — NITROGLYCERIN 0.4 MG SL SUBL: 0.4000 mg | SUBLINGUAL_TABLET | SUBLINGUAL | Status: DC | PRN

## 2017-04-18 MED ORDER — COLCHICINE 0.6 MG PO TABS: 0.6000 mg | ORAL_TABLET | Freq: Every day | ORAL | Status: DC | PRN

## 2017-04-18 MED ORDER — DILTIAZEM HCL 100 MG IV SOLR
5.0000 mg/h | INTRAVENOUS | Status: DC
  Administered 2017-04-18: 5 mg/h via INTRAVENOUS
  Administered 2017-04-19 – 2017-04-20 (×3): 10 mg/h via INTRAVENOUS
  Filled 2017-04-18 (×4): qty 100

## 2017-04-18 MED ORDER — METOPROLOL TARTRATE 5 MG/5ML IV SOLN
10.0000 mg | Freq: Once | INTRAVENOUS | Status: DC
  Filled 2017-04-18: qty 10

## 2017-04-18 MED ORDER — DILTIAZEM HCL 25 MG/5ML IV SOLN
10.0000 mg | Freq: Once | INTRAVENOUS | Status: AC
  Administered 2017-04-18: 10 mg via INTRAVENOUS
  Filled 2017-04-18: qty 5

## 2017-04-18 NOTE — ED Provider Notes (Signed)
Emergency Department Provider Note   I have reviewed the triage vital signs and the nursing notes.   HISTORY  Chief Complaint Abdominal Pain and Tachycardia   HPI [name] [name] is a 66 y.o. male with PMH of dCHF, COPD, CAD, HTN, HLD, PVD, anemia, and OSA who is trach/vent dependent at night presents to the emergency pertinent for evaluation of asymptomatic tachycardia. Patient was at his nephrologist today to notice his heart rate was significantly elevated to the 140s. BP slightly lower than normal as well and he was referred to the ED. He patient states he's had no significant symptoms today. Denies chest pain, shortness of breath, palpitations. He has had some mild left-sided abdominal pain that is constant and non-radiating. He states he just been ignoring it and had not been bothering him. At this time the patient states that he feels "fine." He denies any fevers or chills. He's been eating and drinking normally. He reports occasional dysuria toward the end of urination but nothing significantly worse than usual. No recent medication changes.    Past Medical History:  Diagnosis Date  . Anemia   . Angina   . Anxiety   . Arthritis    gout  . CHF (congestive heart failure) ([place])   . Chronic airway obstruction, not elsewhere classified   . Chronic diastolic CHF (congestive heart failure) ([place]) 10/28/2007   Hospitalized with diastolic CHF controlled with diuresis and BiPAP 2D April 2012 EF 55-60 with severe LVH and grade 1 diastolic dysfunction    . Chronic kidney disease    sees dr Louanna Raw every 6 months  . Chronic pulmonary heart disease, unspecified   . Chronic renal insufficiency, stage III (moderate)   . Chronic respiratory failure ([place])   . COPD (chronic obstructive pulmonary disease) ([place])   . Coronary atherosclerosis of unspecified type of vessel, native or graft   . GERD (gastroesophageal reflux disease)   . History of gout   . Hyperlipidemia   . Hypertension    . Obstructive sleep apnea 10/28/2007  . OSA (obstructive sleep apnea)   . PVD (peripheral vascular disease) ([place]) 07/13/2011   S/P Lt SFA PTA Sept 2009, known occluded Rt SFA   . PVD (peripheral vascular disease) ([place])   . S/P CABG (coronary artery bypass graft) x 6 2000 or 2001   Nuc, 09/17/2012 - Significant ST abnormalities that are suggestive of ischemia, low risk nuclear study  . Shortness of breath dyspnea    with activity  . Swelling of limb    Venous Duplex, 09/03/2012 - no evidence of thrombus or thrombophelitis  . TIA (transient ischemic attack)    2D Echo, 12/05/2010 - EF 55-60%, severe LVH  . Type 2 diabetes mellitus with vascular disease ([place]) 07/13/2011   Type 2   . Unspecified sleep apnea    uses oxygen 4 liter per minute all the time by trach    Patient Active Problem List   Diagnosis Date Noted  . New onset atrial flutter ([place]) 04/18/2017  . Hypotension 04/18/2017  . Normocytic anemia 04/18/2017  . UTI (urinary tract infection) 04/18/2017  . Sepsis ([place]) 04/18/2017  . COPD exacerbation ([place]) 08/09/2016  . 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 Pneumonia due to Pseudomonas ([place]) 01/06/2015  . Hypercapnic respiratory failure, chronic ([place]) 11/18/2014  . Lobar pneumonia due to unspecified organism 11/18/2014  . SIRS (systemic inflammatory response syndrome) ([facility])   . Dyspnea and respiratory abnormality 05/12/2014  . Thrombocytopenia, unspecified ([place]) 06/12/2013  . Acute  on chronic renal failure ([place]) 06/12/2013  . Tracheostomy status ([place]) 06/02/2013  . Gout flare 05/31/2013  . Morbid obesity  03/30/2013  . Encephalopathy acute 01/16/2012  . Respiratory acidosis 01/16/2012  . Acute respiratory failure with hypoxia ([place]) 01/15/2012  . Anemia, past Hx GI bleed 5/12 07/14/2011  . PVD (peripheral vascular disease) ([place]) 07/13/2011  . CKD (chronic kidney disease) stage 3, GFR 30-59 ml/min 07/13/2011  . Type 2 diabetes mellitus with vascular disease ([place])  07/13/2011  . Hypertensive heart disease 07/13/2011  . OSA (obstructive sleep apnea) 10/28/2007  . CAD (coronary artery disease) 10/28/2007  . Chronic diastolic CHF (congestive heart failure) ([place]) 10/28/2007  . COPD (chronic obstructive pulmonary disease) ([place]) 10/28/2007  . Chronic respiratory failure with hypoxia and hypercapnia ([place]) 10/28/2007  . Obstructive sleep apnea 10/28/2007    Past Surgical History:  Procedure Laterality Date  . Abdominal Aortogram  12/03/2002   70% stenosis at the origin of the profunda femoris, 75% mid superficial femoral artery stenosis  . APPENDECTOMY    . BILATERAL VATS ABLATION    . CARDIAC CATHETERIZATION  02/16/2010   RCA-80% stenosis stented with a 3x12 Taxus ION DES resulting in reduction of a total occlusion to 0%  . CARDIAC CATHETERIZATION  03/29/2009   Ostial/Proximal PDA-SVG-75% stenosis stented with a 3.0x23 Xience V DES reduction resulting in 75% stenosis to 0%  . CARDIAC CATHETERIZATION  08/05/2004   Circumflex OM stented with a 2.5x13 Cypher stent resulting in reduction of 60% to 0%  . CARDIAC CATHETERIZATION  06/28/2000   Moderate left main and severe three-vessel disease, consider CABG  . CATARACT EXTRACTION    . COLONOSCOPY  08/11/2011   Procedure: COLONOSCOPY;  Surgeon: Missy Sabins, MD;  Location: [facility];  Service: Endoscopy;  Laterality: N/A;  . COLONOSCOPY WITH PROPOFOL N/A 09/07/2014   Procedure: COLONOSCOPY WITH PROPOFOL;  Surgeon: Missy Sabins, MD;  Location: [facility];  Service: Endoscopy;  Laterality: N/A;  . CORONARY ANGIOPLASTY WITH STENT PLACEMENT     x 2  . CORONARY ARTERY BYPASS GRAFT  07/12/2000   x6,  . ESOPHAGOGASTRODUODENOSCOPY  08/11/2011   Procedure: ESOPHAGOGASTRODUODENOSCOPY (EGD);  Surgeon: Missy Sabins, MD;  [facility] ENDOSCOPY;  Service: Endoscopy;  Laterality: N/A;  . ESOPHAGOGASTRODUODENOSCOPY (EGD) WITH PROPOFOL N/A 09/07/2014   Procedure: ESOPHAGOGASTRODUODENOSCOPY (EGD) WITH PROPOFOL;  Surgeon:  Missy Sabins, MD;  Location: [facility];  Service: Endoscopy;  Laterality: N/A;  . hemmorhoids    . lung sx  2005   Growth on outside of right lung  . PTCA  04/16/2008   Left SFA-60% stenosis stented with a 7x6 Nitinolself-expanding stent resulting in reduction of 60% mid segmental L SFA stenosis to 0% residual  . TRACHEOSTOMY TUBE PLACEMENT N/A 05/23/2013   Procedure: TRACHEOSTOMY;  Surgeon: Melida Quitter, MD;  Location: [facility];  Service: ENT;  Laterality: N/A;    Current Outpatient Rx  . Order #: [id number] Class: Historical Med  . Order #: [id number] Class: No Print  . Order #: [id number] Class: Historical Med  . Order #: [id number] Class: Print  . Order #: [id number] Class: Historical Med  . Order #: [id number] Class: Print  . Order #: [id number] Class: Normal  . Order #: [id number] Class: Historical Med  . Order #: [id number] Class: Historical Med  . Order #: [id number] Class: Normal  . Order #: [id number] Class: Normal  . Order #: [id number] Class: Normal    Allergies Amlodipine besy-benazepril hcl and Percocet [oxycodone-acetaminophen]  Family History  Problem Relation Age of Onset  . Diabetes Sister   .  Hypertension Sister   . Cancer Sister   . Heart attack Neg Hx   . Stroke Neg Hx     Social History Social History  Substance Use Topics  . Smoking status: Former Smoker    Packs/day: 1.00    Years: 25.00    Types: Cigarettes    Quit date: 08/14/1998  . Smokeless tobacco: Never Used  . Alcohol use No    Review of Systems  Constitutional: No fever/chills Eyes: No visual changes. ENT: No sore throat. Cardiovascular: Denies chest pain. Respiratory: Denies shortness of breath. Gastrointestinal: Positive mild left-sided abdominal pain.  No nausea, no vomiting.  No diarrhea.  No constipation. Genitourinary: Negative for dysuria. Musculoskeletal: Negative for back pain. Skin: Negative for rash. Neurological: Negative for headaches, focal weakness or numbness.  10-point ROS otherwise  negative.  ____________________________________________   PHYSICAL EXAM:  VITAL SIGNS: ED Triage Vitals [04/18/17 1614]  Enc Vitals Group     BP 97/75     Pulse Rate (!) 140     Resp 18     Temp 98.2 F (36.8 C)     Temp Source Oral     SpO2 100 %   Constitutional: Alert and oriented. Well appearing and in no acute distress. Eyes: Conjunctivae are normal. Head: Atraumatic. Nose: No congestion/rhinnorhea. Mouth/Throat: Mucous membranes are moist.  Oropharynx non-erythematous. Neck: No stridor. Trach in place and well-appearing.  Cardiovascular: Tachycardia. Good peripheral circulation. Grossly normal heart sounds.   Respiratory: Normal respiratory effort.  No retractions. Lungs CTAB. Gastrointestinal: Soft and nontender. No distention.  Musculoskeletal: No lower extremity tenderness nor edema. No gross deformities of extremities. Neurologic:  Normal speech and language. No gross focal neurologic deficits are appreciated.  Skin:  Skin is warm, dry and intact. No rash noted.  ____________________________________________   LABS (all labs ordered are listed, but only abnormal results are displayed)  Labs Reviewed  BASIC METABOLIC PANEL - Abnormal; Notable for the following:       Result Value   Chloride 81 (*)    CO2 45 (*)    BUN 78 (*)    Creatinine, Ser 2.36 (*)    GFR calc non Af Amer 27 (*)    GFR calc Af Amer 32 (*)    All other components within normal limits  CBC - Abnormal; Notable for the following:    RBC 2.93 (*)    Hemoglobin 7.5 (*)    HCT 27.9 (*)    MCH 25.6 (*)    MCHC 26.9 (*)    Platelets 138 (*)    All other components within normal limits  HEPATIC FUNCTION PANEL - Abnormal; Notable for the following:    ALT 9 (*)    All other components within normal limits  URINALYSIS, ROUTINE W REFLEX MICROSCOPIC - Abnormal; Notable for the following:    APPearance HAZY (*)    Hgb urine dipstick LARGE (*)    Protein, ur 100 (*)    Leukocytes, UA LARGE (*)     Bacteria, UA FEW (*)    All other components within normal limits  URINE CULTURE  CULTURE, BLOOD (ROUTINE X 2)  CULTURE, BLOOD (ROUTINE X 2)  LIPASE, BLOOD  OCCULT BLOOD X 1 CARD TO LAB, STOOL  VITAMIN B12  FOLATE  IRON AND TIBC  FERRITIN  RETICULOCYTES  BRAIN NATRIURETIC PEPTIDE  RAPID URINE DRUG SCREEN, HOSP PERFORMED  TSH  T4, FREE  T3, FREE  HEMOGLOBIN A1C  LIPID PANEL  TROPONIN I  TROPONIN I  TROPONIN I  LACTIC ACID, PLASMA  PROCALCITONIN  PROTIME-INR  APTT  HIV ANTIBODY (ROUTINE TESTING)  BASIC METABOLIC PANEL  CBC  I-STAT TROPONIN, ED  I-STAT CG4 LACTIC ACID, ED  TYPE AND SCREEN   ____________________________________________  EKG   EKG Interpretation  Date/Time:  Wednesday April 18 2017 16:07:27 EDT Ventricular Rate:  139 PR Interval:  98 QRS Duration: 156 QT Interval:  338 QTC Calculation: 514 R Axis:   -150 Text Interpretation:  Sinus tachycardia with short PR Right bundle branch block Minimal voltage criteria for LVH, may be normal variant Abnormal ECG No STEMI.  Confirmed by Nanda Quinton [phone]) on 04/18/2017 4:55:36 PM       ____________________________________________  RADIOLOGY  Dg Chest 2 View  Result Date: 04/18/2017 CLINICAL DATA:  Tachycardia and hypotension. EXAM: CHEST  2 VIEW COMPARISON:  February 26, 2017 ; November 17, 2014 FINDINGS: Tracheostomy catheter tip is 6.4 cm above the carina. No pneumothorax. 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 There are bilateral pleural effusions. There is moderate interstitial edema bilaterally. There is no airspace consolidation. There is cardiomegaly with pulmonary venous hypertension. There is aortic atherosclerosis. Patient is status post coronary artery bypass grafting. No evident bone lesions. IMPRESSION: Tracheostomy as described without evident pneumothorax. Evidence of congestive heart failure without airspace consolidation. There is aortic  atherosclerosis. 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 Aortic Atherosclerosis (ICD10-I70.0). Electronically Signed   By: Lowella Grip III M.D.   On: 04/18/2017 17:09    ____________________________________________   PROCEDURES  Procedure(s) performed:   Procedures  CRITICAL CARE Performed by: Margette Fast Total critical care time: 45 minutes Critical care time was exclusive of separately billable procedures and treating other patients. Critical care was necessary to treat or prevent imminent or life-threatening deterioration. Critical care was time spent personally by me on the following activities: development of treatment plan with patient and/or surrogate as well as nursing, discussions with consultants, evaluation of patient's response to treatment, examination of patient, obtaining history from patient or surrogate, ordering and performing treatments and interventions, ordering and review of laboratory studies, ordering and review of radiographic studies, pulse oximetry and re-evaluation of patient's condition.  Nanda Quinton, MD Emergency Medicine  ____________________________________________   INITIAL IMPRESSION / ASSESSMENT AND PLAN / ED COURSE  Pertinent labs & imaging results that were available during my care of the patient were reviewed by me and considered in my medical decision making (see chart for details).  Patient presents to the emergency room in for evaluation of tachycardia. EKG shows continued right bundle branch block with significant tachycardia. Unclear if this is sinus or not due to rate. No acute ischemic changes. Patient is completely asymptomatic. He has some mild left sided abdominal pain but is non-tender to deep palpation of the abdomen. His hemoglobin is dropping now at 7.5. His wife states he supposed to be getting iron infusions but has missed the last month of infusions. No evidence to suggest acute blood  loss.   08:30 PM Spoke with Cardiology regarding HR. Recommend adenosine. When going back to reassess the patient's rhythm has changed.   No adenosine given. Patient with improved rate with Diltiazem. Suspect underlying a-flutter/fib. Patient may require blood transfusion and have treated apparent UTI with Rocephin.   Discussed patient's case with Hospitalist, Dr. Blaine Hamper to request admission. Patient and family (if present) updated with plan. Care transferred to Hospitalist service.  I reviewed all  nursing notes, vitals, pertinent old records, EKGs, labs, imaging (as available).  ____________________________________________  FINAL CLINICAL IMPRESSION(S) / ED DIAGNOSES  Final diagnoses:  Atrial flutter, unspecified type ([place])  Anemia, unspecified type  Acute cystitis without hematuria     MEDICATIONS GIVEN DURING THIS VISIT:  Medications  diltiazem (CARDIZEM) 100 mg in dextrose 5 % 100 mL (1 mg/mL) infusion (5 mg/hr Intravenous New Bag/Given 04/18/17 2053)  albuterol (PROVENTIL) (2.5 MG/3ML) 0.083% nebulizer solution 2.5 mg (not administered)  arformoterol (BROVANA) nebulizer solution 15 mcg (not administered)  budesonide (PULMICORT) nebulizer solution 0.5 mg (not administered)  dextromethorphan-guaiFENesin (MUCINEX DM) 30-600 MG per 12 hr tablet 1 tablet (not administered)  acetaminophen (TYLENOL) tablet 500 mg (not administered)  nitroGLYCERIN (NITROSTAT) SL tablet 0.4 mg (not administered)  allopurinol (ZYLOPRIM) tablet 100 mg (not administered)  colchicine tablet 0.6 mg (not administered)  clopidogrel (PLAVIX) tablet 75 mg (not administered)  aspirin tablet 81 mg (not administered)  0.9 %  sodium chloride infusion ( Intravenous New Bag/Given 04/18/17 2248)  ondansetron (ZOFRAN) injection 4 mg (not administered)  zolpidem (AMBIEN) tablet 5 mg (not administered)  heparin injection 5,000 Units (not administered)  cefTRIAXone (ROCEPHIN) 1 g in dextrose 5 % 50 mL IVPB (not  administered)  sodium chloride 0.9 % bolus 500 mL (0 mLs Intravenous Stopped 04/18/17 1905)  cefTRIAXone (ROCEPHIN) 1 g in dextrose 5 % 50 mL IVPB (0 g Intravenous Stopped 04/18/17 2036)  diltiazem (CARDIZEM) injection 10 mg (10 mg Intravenous Given 04/18/17 2001)  sodium chloride 0.9 % bolus 500 mL (0 mLs Intravenous Stopped 04/18/17 2053)     NEW OUTPATIENT MEDICATIONS STARTED DURING THIS VISIT:  None   Note:  This document was prepared using Dragon voice recognition software and may include unintentional dictation errors.  Nanda Quinton, MD Emergency Medicine    Jwan Hornbaker, [facility], MD 04/18/17 [phone]

## 2017-04-18 NOTE — ED Triage Notes (Signed)
Pt reports going to renal md for check up and was told his heart was racing and bp was low. Pt reports recent abd pain, no chest pain or sob. Pt has trach pta. HR 140 at triage and ekg done.

## 2017-04-18 NOTE — H&P (Signed)
History and Physical    [name] OZD:[id number] DOB: Mar 12, 1951 DOA: 04/18/2017  Referring MD/NP/PA:   PCP: [name] Ebbs, MD   Patient coming from:  The patient is coming from home.  At baseline, pt is independent for most of ADL.   Chief Complaint: Palpitation, dysuria  HPI: [name] [name] is a 66 y.o. male with medical history significant of hypertension, hyperlipidemia, GERD, gout, diastolic congestive heart failure, diet-controlled diabetes mellitus, CAD, s/p of CABG, TIA, COPD,  OSA, and chronic respiratory failure, ventilator dependent nightly, s/p of trach in placement, CKD-III, who presents with palpitation, dysuria.  Pt states that he was in his renal doctor's office for checkup, and developed palpitation, and was found to have heart rate up to 140s. Patient has mild SOB, but no chest pain. He has mild cough with clear mucus production. No fever or chills. He states that he had left upper quadrant abdominal pain earlier, which has resolved. Currently patient does not have nausea, vomiting, diarrhea, abdominal pain. Patient states that he has burning on urination, dysuria and increased urinary frequency. He did not noted any bloody urine or stool.   ED Course: pt was found to have new-onset atrial flutter/Afib with RVR, hypotension with blood pressure 89/67 which improved to 93/74 left heart rate is controlled by Cardizem drip, WBC 4.5, hemoglobin dropped from 9.0 on 03/16/17-->7.5, lactic acid 1.61, troponin negative, positive urinalysis with large amount of leukocytes, stable renal function, temperature normal, tachycardia, tachypnea, O2 sat 87% on room air. Chest x-ray showed emphysema and CHF. Patient is admitted to stepdown as inpatient.  Review of Systems:   General: no fevers, chills, has poor appetite, has fatigue HEENT: no blurry vision, hearing changes or sore throat Respiratory: has dyspnea, coughing, no wheezing CV: no chest pain, has palpitations GI: no  nausea, vomiting, abdominal pain, diarrhea, constipation GU: no dysuria, burning on urination, increased urinary frequency, hematuria  Ext: has trace leg edema Neuro: no unilateral weakness, numbness, or tingling, no vision change or hearing loss Skin: no rash, no skin tear. MSK: No muscle spasm, no deformity, no limitation of range of movement in spin Heme: No easy bruising.  Travel history: No recent long distant travel.  Allergy:  Allergies  Allergen Reactions  . Amlodipine Besy-Benazepril Hcl Swelling    LIPS SWELL ANGIOEDEMA  . Percocet [Oxycodone-Acetaminophen] Other (See Comments)    HALLUCINATIONS    Past Medical History:  Diagnosis Date  . Anemia   . Angina   . Anxiety   . Arthritis    gout  . CHF (congestive heart failure) ([place])   . Chronic airway obstruction, not elsewhere classified   . Chronic diastolic CHF (congestive heart failure) ([place]) 10/28/2007   Hospitalized with diastolic CHF controlled with diuresis and BiPAP 2D April 2012 EF 55-60 with severe LVH and grade 1 diastolic dysfunction    . Chronic kidney disease    sees dr [name] [name] every 6 months  . Chronic pulmonary heart disease, unspecified   . Chronic renal insufficiency, stage III (moderate)   . Chronic respiratory failure ([place])   . COPD (chronic obstructive pulmonary disease) ([place])   . Coronary atherosclerosis of unspecified type of vessel, native or graft   . GERD (gastroesophageal reflux disease)   . History of gout   . Hyperlipidemia   . Hypertension   . Obstructive sleep apnea 10/28/2007  . OSA (obstructive sleep apnea)   . PVD (peripheral vascular disease) ([place]) 07/13/2011   S/P Lt SFA PTA Sept 2009, known  occluded Rt SFA   . PVD (peripheral vascular disease) ([place])   . S/P CABG (coronary artery bypass graft) x 6 2000 or 2001   Nuc, 09/17/2012 - Significant ST abnormalities that are suggestive of ischemia, low risk nuclear study  . Shortness of breath dyspnea    with activity  . Swelling of  limb    Venous Duplex, 09/03/2012 - no evidence of thrombus or thrombophelitis  . TIA (transient ischemic attack)    2D Echo, 12/05/2010 - EF 55-60%, severe LVH  . Type 2 diabetes mellitus with vascular disease ([place]) 07/13/2011   Type 2   . Unspecified sleep apnea    uses oxygen 4 liter per minute all the time by trach    Past Surgical History:  Procedure Laterality Date  . Abdominal Aortogram  12/03/2002   70% stenosis at the origin of the profunda femoris, 75% mid superficial femoral artery stenosis  . APPENDECTOMY    . BILATERAL VATS ABLATION    . CARDIAC CATHETERIZATION  02/16/2010   RCA-80% stenosis stented with a 3x12 Taxus ION DES resulting in reduction of a total occlusion to 0%  . CARDIAC CATHETERIZATION  03/29/2009   Ostial/Proximal PDA-SVG-75% stenosis stented with a 3.0x23 Xience V DES reduction resulting in 75% stenosis to 0%  . CARDIAC CATHETERIZATION  08/05/2004   Circumflex OM stented with a 2.5x13 Cypher stent resulting in reduction of 60% to 0%  . CARDIAC CATHETERIZATION  06/28/2000   Moderate left main and severe three-vessel disease, consider CABG  . CATARACT EXTRACTION    . COLONOSCOPY  08/11/2011   Procedure: COLONOSCOPY;  Surgeon: [name] Sabins, MD;  Location: [facility];  Service: Endoscopy;  Laterality: N/A;  . COLONOSCOPY WITH PROPOFOL N/A 09/07/2014   Procedure: COLONOSCOPY WITH PROPOFOL;  Surgeon: [name] Sabins, MD;  Location: [facility];  Service: Endoscopy;  Laterality: N/A;  . CORONARY ANGIOPLASTY WITH STENT PLACEMENT     x 2  . CORONARY ARTERY BYPASS GRAFT  07/12/2000   x6,  . ESOPHAGOGASTRODUODENOSCOPY  08/11/2011   Procedure: ESOPHAGOGASTRODUODENOSCOPY (EGD);  Surgeon: [name] Sabins, MD;  [facility] ENDOSCOPY;  Service: Endoscopy;  Laterality: N/A;  . ESOPHAGOGASTRODUODENOSCOPY (EGD) WITH PROPOFOL N/A 09/07/2014   Procedure: ESOPHAGOGASTRODUODENOSCOPY (EGD) WITH PROPOFOL;  Surgeon: [name] Sabins, MD;  Location: [facility] ENDOS[facility];  Service: Endoscopy;   Laterality: N/A;  . hemmorhoids    . lung sx  2005   Growth on outside of right lung  . PTCA  04/16/2008   Left SFA-60% stenosis stented with a 7x6 Nitinolself-expanding stent resulting in reduction of 60% mid segmental L SFA stenosis to 0% residual  . TRACHEOSTOMY TUBE PLACEMENT N/A 05/23/2013   Procedure: TRACHEOSTOMY;  Surgeon: Melida Quitter, MD;  Location: [facility];  Service: ENT;  Laterality: N/A;    Social History:  reports that he quit smoking about 18 years ago. His smoking use included Cigarettes. He has a 25.00 pack-year smoking history. He has never used smokeless tobacco. He reports that he does not drink alcohol or use drugs.  Family History:  Family History  Problem Relation Age of Onset  . Diabetes Sister   . Hypertension Sister   . Cancer Sister   . Heart attack Neg Hx   . Stroke Neg Hx      Prior to Admission medications   Medication Sig Start Date End Date Taking? Authorizing Provider  acetaminophen (TYLENOL) 500 MG tablet Take 500 mg by mouth every 6 (six) hours as needed for mild pain or  moderate pain.    [provider]  albuterol (PROVENTIL) (2.5 MG/3ML) 0.083% nebulizer solution Take 3 mLs (2.5 mg total) by nebulization every 4 (four) hours as needed for wheezing or shortness of breath. 08/09/16   Tanda Rockers, MD  allopurinol (ZYLOPRIM) 100 MG tablet Take 1 tablet by mouth daily. 10/08/13   [provider]  arformoterol (BROVANA) 15 MCG/2ML NEBU Take 2 mLs (15 mcg total) by nebulization 2 (two) times daily. 05/24/16   Parrett, Fonnie Mu, NP  aspirin 81 MG tablet Take 81 mg by mouth daily.    [provider]  budesonide (PULMICORT) 0.5 MG/2ML nebulizer solution USE ONE VIAL IN NEBULIZER TWICE DAILY 05/24/16   Parrett, Fonnie Mu, NP  clopidogrel (PLAVIX) 75 MG tablet TAKE ONE TABLET BY MOUTH ONCE DAILY 02/09/17   Lorretta Harp, MD  COLCRYS 0.6 MG tablet Take 0.6 mg by mouth daily as needed.  04/28/13   [provider]    dextromethorphan-guaiFENesin (MUCINEX DM) 30-600 MG 12hr tablet Take 1 tablet by mouth 2 (two) times daily. Patient takes for mucus relief    [provider]  furosemide (LASIX) 40 MG tablet TAKE TWO TABLETS BY MOUTH IN THE MORNING AND ONE TABLET IN THE EVENING 03/14/17   Lorretta Harp, MD  metolazone (ZAROXOLYN) 2.5 MG tablet TAKE ONE TABLET BY MOUTH ONCE DAILY ON  MONDAY,  WEDNESDAY,  AND  FRIDAY  30  MINUTES  BEFORE  LASIX 09/13/15   Lorretta Harp, MD  nitroGLYCERIN (NITROSTAT) 0.4 MG SL tablet Place 1 tablet (0.4 mg total) under the tongue every 5 (five) minutes as needed. For chest pain 01/28/14   Lorretta Harp, MD    Physical Exam: Vitals:   04/18/17 2300 04/18/17 2330 04/19/17 0100 04/19/17 0352  BP: 90/75 124/80 119/66 107/66  Pulse: 74 (!) 111 (!) 115 (!) 101  Resp: (!) 23 19 19  (!) 21  Temp:   98.2 F (36.8 C) 97.9 F (36.6 C)  TempSrc:   Oral Oral  SpO2: 100% 94% 95% 97%  Weight:   104.5 kg (230 lb 6.4 oz)   Height:   6\' 1"  (1.854 m)    General: Not in acute distress HEENT:       Eyes: PERRL, EOMI, no scleral icterus.       ENT: No discharge from the ears and nose, no pharynx injection, no tonsillar enlargement.        Neck: No JVD, no bruit, no mass felt. Heme: No neck lymph node enlargement. Cardiac: S1/S2, Irregular rhythm, tachycardia, No murmurs, No gallops or rubs. Respiratory: No rales, wheezing, rhonchi or rubs. S/p of trach GI: Soft, nondistended, nontender, no rebound pain, no organomegaly, BS present. GU: No hematuria Ext: has trace leg edema bilaterally. 2+DP/PT pulse bilaterally. Musculoskeletal: No joint deformities, No joint redness or warmth, no limitation of ROM in spin. Skin: No rashes.  Neuro: Alert, oriented X3, cranial nerves II-XII grossly intact, moves all extremities normally.  Psych: Patient is not psychotic, no suicidal or hemocidal ideation.  Labs on Admission: I have personally reviewed following labs and imaging  studies  CBC:  Recent Labs Lab 04/18/17 1628 04/19/17 0403  WBC 4.5 4.4  HGB 7.5* 7.2*  HCT 27.9* 25.4*  MCV 95.2 93.4  PLT 138* 284*   Basic Metabolic Panel:  Recent Labs Lab 04/18/17 1628 04/19/17 0403  NA 138 138  K 4.0 3.7  CL 81* 86*  CO2 45* 38*  GLUCOSE 81 107*  BUN  78* 74*  CREATININE 2.36* 2.22*  CALCIUM 9.0 8.8*   GFR: Estimated Creatinine Clearance: 42.1 mL/min (A) (by C-G formula based on SCr of 2.22 mg/dL (H)). Liver Function Tests:  Recent Labs Lab 04/18/17 1814  AST 24  ALT 9*  ALKPHOS 71  BILITOT 1.0  PROT 7.3  ALBUMIN 3.6    Recent Labs Lab 04/18/17 1814  LIPASE 31   No results for input(s): AMMONIA in the last 168 hours. Coagulation Profile:  Recent Labs Lab 04/19/17 0233  INR 0.99   Cardiac Enzymes:  Recent Labs Lab 04/19/17 0233 04/19/17 0403  TROPONINI 0.04* 0.04*   BNP (last 3 results) No results for input(s): PROBNP in the last 8760 hours. HbA1C:  Recent Labs  04/19/17 0233  HGBA1C 5.1   CBG: No results for input(s): GLUCAP in the last 168 hours. Lipid Profile: No results for input(s): CHOL, HDL, LDLCALC, TRIG, CHOLHDL, LDLDIRECT in the last 72 hours. Thyroid Function Tests:  Recent Labs  04/19/17 0233 04/19/17 0403  TSH 0.462  --   FREET4  --  1.32*   Anemia Panel:  Recent Labs  04/19/17 0233  VITAMINB12 929*  FOLATE 8.5  FERRITIN 558*  TIBC 277  IRON 30*  RETICCTPCT 1.0   Urine analysis:    Component Value Date/Time   COLORURINE YELLOW 04/18/2017 1830   APPEARANCEUR HAZY (A) 04/18/2017 1830   LABSPEC 1.011 04/18/2017 1830   PHURINE 7.0 04/18/2017 1830   GLUCOSEU NEGATIVE 04/18/2017 1830   HGBUR LARGE (A) 04/18/2017 1830   BILIRUBINUR NEGATIVE 04/18/2017 1830   KETONESUR NEGATIVE 04/18/2017 1830   PROTEINUR 100 (A) 04/18/2017 1830   UROBILINOGEN 0.2 06/19/2014 1532   NITRITE NEGATIVE 04/18/2017 1830   LEUKOCYTESUR LARGE (A) 04/18/2017 1830   Sepsis  Labs: @LABRCNTIP (procalcitonin:4,lacticidven:4) ) Recent Results (from the past 240 hour(s))  MRSA PCR Screening     Status: None   Collection Time: 04/19/17 12:46 AM  Result Value Ref Range Status   MRSA by PCR NEGATIVE NEGATIVE Final    Comment:        The GeneXpert MRSA Assay (FDA approved for NASAL specimens only), is one component of a comprehensive MRSA colonization surveillance program. It is not intended to diagnose MRSA infection nor to guide or monitor treatment for MRSA infections.      Radiological Exams on Admission: Dg Chest 2 View  Result Date: 04/18/2017 CLINICAL DATA:  Tachycardia and hypotension. EXAM: CHEST  2 VIEW COMPARISON:  February 26, 2017 ; November 17, 2014 FINDINGS: Tracheostomy catheter tip is 6.4 cm above the carina. No pneumothorax. 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 There are bilateral pleural effusions. There is moderate interstitial edema bilaterally. There is no airspace consolidation. There is cardiomegaly with pulmonary venous hypertension. There is aortic atherosclerosis. Patient is status post coronary artery bypass grafting. No evident bone lesions. IMPRESSION: Tracheostomy as described without evident pneumothorax. Evidence of congestive heart failure without airspace consolidation. There is aortic atherosclerosis. 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 Aortic Atherosclerosis (ICD10-I70.0). Electronically Signed   By: Lowella Grip III M.D.   On: 04/18/2017 17:09     EKG: Independently reviewed.  A flutter/atrial fibrillation, QTC 514, right bundle blockage  Assessment/Plan Principal Problem:   New onset atrial flutter (HCC) Active Problems:   CAD (coronary artery disease)   Chronic diastolic CHF (congestive heart failure) (HCC)   COPD (chronic obstructive pulmonary disease) (HCC)   Chronic respiratory failure  with hypoxia and hypercapnia (HCC)    Obstructive sleep apnea   CKD (chronic kidney disease) stage 3, GFR 30-59 ml/min   Tracheostomy dependent (HCC)   Hypotension   Normocytic anemia   UTI (urinary tract infection)   Sepsis ([place])   New onset atrial flutter with RVR: Hr is up to 140 and had hypotension which resolved after HR is controled. This is likely triggered by UTI. and sepsis. Pt has no CP. CHA2DS2-VASc Score is 7, needs oral anticoagulation. Will not start AC before ruling out GIB since pt has hx of GIB and dropping Hgb.   -will admit to SDU as inpt. -will not start IV heparin before r/u GIB-->f/u FOBT -IV cardizem gtt -check TSH, Free t4 and t3 - cycle CE q6 x3 and repeat EKG in the am  - will check FLP, UDS and A1C  - 2d echo - Inpatient non-urgent consult order was  put in Epic and message to [employer] was sent out.  UTI and sepsis: Patient admitted to [facility] for sepsis with hypotension, tachycardia and tachypnea. Lactic acid is 1.61. Blood pressure has improved. Currently hemodynamically stable. -IV rocephin -f/u Bx and Ux -will get Procalcitonin and trend lactic acid levels per sepsis protocol. -IVF: 500 of NS bolus in ED, followed by 75 cc/h   Chronic respiratory failure with hypoxia and hypercapnia due to COPD and OSA: s/p of trach. Ventilator dependent nightly -consul to RT -Continue home breathing treatment: Albuterol, Brovana, poulmicort  CAD: s/p of CABG. No chest pain. -continue aspirin, Plavix, when necessary nitroglycerin  Chronic diastolic CHF (congestive heart failure) ([place]): 2-D echo on 05/21/13 showed EF of 55-60% with grade 1 diastolic dysfunction. Patient has trace leg edema, no JVD. Set her seems to be compensated on admission. -Hold Lasix and metolazone due to sepsis -Check BNP  CKD (chronic kidney disease) stage 3: stable. Baseline creatinine 2.1-2.5. His creatinine to 2.36. -Follow-up by BMP  Hypotension: Has resolved, most likely due to severe tachycardia. -IVF: 500 of NS bolus  in ED, followed by 75 cc/h   Normocytic anemia: Hemoglobin dropped from 9.0 on 03/16/17-->7.5. Patient denies rectal bleeding -Anemia panel -Follow-up FOBT    DVT ppx: SQ Heparin    Code Status: Full code Family Communication:  Yes, patient's wife at bed side Disposition Plan:  Anticipate discharge back to previous home environment Consults called: none Admission status: SDU/inpation       Date of Service 04/19/2017    Ivor Costa [facility] Pager [phone]  If 7PM-7AM, please contact night-coverage [email or web address] Password [email or web address] 04/19/2017, 7:08 AM

## 2017-04-18 NOTE — ED Notes (Signed)
Attempted report x1. RN unavailable @ this time. 

## 2017-04-18 NOTE — ED Notes (Signed)
Pt and family made aware of bed assignment 

## 2017-04-19 ENCOUNTER — Inpatient Hospital Stay (HOSPITAL_COMMUNITY): Payer: Medicare Other

## 2017-04-19 DIAGNOSIS — J441 Chronic obstructive pulmonary disease with (acute) exacerbation: Secondary | ICD-10-CM

## 2017-04-19 DIAGNOSIS — I4892 Unspecified atrial flutter: Secondary | ICD-10-CM

## 2017-04-19 DIAGNOSIS — D649 Anemia, unspecified: Secondary | ICD-10-CM

## 2017-04-19 DIAGNOSIS — N3000 Acute cystitis without hematuria: Secondary | ICD-10-CM

## 2017-04-19 DIAGNOSIS — I5032 Chronic diastolic (congestive) heart failure: Secondary | ICD-10-CM

## 2017-04-19 DIAGNOSIS — N183 Chronic kidney disease, stage 3 (moderate): Secondary | ICD-10-CM

## 2017-04-19 DIAGNOSIS — I25110 Atherosclerotic heart disease of native coronary artery with unstable angina pectoris: Secondary | ICD-10-CM

## 2017-04-19 LAB — ECHOCARDIOGRAM COMPLETE
HEIGHTINCHES: 73 in
Weight: 3686.4 oz

## 2017-04-19 LAB — CBC
HCT: 25.4 % — ABNORMAL LOW (ref 39.0–52.0)
Hemoglobin: 7.2 g/dL — ABNORMAL LOW (ref 13.0–17.0)
MCH: 26.5 pg (ref 26.0–34.0)
MCHC: 28.3 g/dL — AB (ref 30.0–36.0)
MCV: 93.4 fL (ref 78.0–100.0)
PLATELETS: 111 10*3/uL — AB (ref 150–400)
RBC: 2.72 MIL/uL — ABNORMAL LOW (ref 4.22–5.81)
RDW: 15.2 % (ref 11.5–15.5)
WBC: 4.4 10*3/uL (ref 4.0–10.5)

## 2017-04-19 LAB — BASIC METABOLIC PANEL
Anion gap: 14 (ref 5–15)
BUN: 74 mg/dL — AB (ref 6–20)
CALCIUM: 8.8 mg/dL — AB (ref 8.9–10.3)
CO2: 38 mmol/L — AB (ref 22–32)
CREATININE: 2.22 mg/dL — AB (ref 0.61–1.24)
Chloride: 86 mmol/L — ABNORMAL LOW (ref 101–111)
GFR calc non Af Amer: 29 mL/min — ABNORMAL LOW (ref >60)
GFR, EST AFRICAN AMERICAN: 34 mL/min — AB (ref >60)
GLUCOSE: 107 mg/dL — AB (ref 65–99)
Potassium: 3.7 mmol/L (ref 3.5–5.1)
Sodium: 138 mmol/L (ref 135–145)

## 2017-04-19 LAB — TROPONIN I
TROPONIN I: 0.04 ng/mL — AB (ref <0.03)
Troponin I: 0.04 ng/mL (ref <0.03)
Troponin I: 0.03 ng/mL (ref <0.03)

## 2017-04-19 LAB — IRON AND TIBC
IRON: 30 ug/dL — AB (ref 45–182)
Saturation Ratios: 11 % — ABNORMAL LOW (ref 17.9–39.5)
TIBC: 277 ug/dL (ref 250–450)
UIBC: 247 ug/dL

## 2017-04-19 LAB — LIPID PANEL
CHOL/HDL RATIO: 3.5 ratio
CHOLESTEROL: 159 mg/dL (ref 0–200)
HDL: 46 mg/dL (ref >40)
LDL Cholesterol: 99 mg/dL (ref 0–99)
TRIGLYCERIDES: 69 mg/dL (ref <150)
VLDL: 14 mg/dL (ref 0–40)

## 2017-04-19 LAB — URINE CULTURE

## 2017-04-19 LAB — HEMOGLOBIN A1C
Hgb A1c MFr Bld: 5.1 % (ref 4.8–5.6)
MEAN PLASMA GLUCOSE: 99.67 mg/dL

## 2017-04-19 LAB — HIV ANTIBODY (ROUTINE TESTING W REFLEX): HIV Screen 4th Generation wRfx: NONREACTIVE

## 2017-04-19 LAB — RETICULOCYTES
RBC.: 2.82 MIL/uL — ABNORMAL LOW (ref 4.22–5.81)
RETIC COUNT ABSOLUTE: 28.2 10*3/uL (ref 19.0–186.0)
Retic Ct Pct: 1 % (ref 0.4–3.1)

## 2017-04-19 LAB — RAPID URINE DRUG SCREEN, HOSP PERFORMED
AMPHETAMINES: NOT DETECTED
BENZODIAZEPINES: NOT DETECTED
Barbiturates: NOT DETECTED
COCAINE: NOT DETECTED
OPIATES: NOT DETECTED
Tetrahydrocannabinol: NOT DETECTED

## 2017-04-19 LAB — FOLATE: FOLATE: 8.5 ng/mL (ref >5.9)

## 2017-04-19 LAB — PROTIME-INR
INR: 0.99
PROTHROMBIN TIME: 13 s (ref 11.4–15.2)

## 2017-04-19 LAB — MRSA PCR SCREENING: MRSA BY PCR: NEGATIVE

## 2017-04-19 LAB — T4, FREE: Free T4: 1.32 ng/dL — ABNORMAL HIGH (ref 0.61–1.12)

## 2017-04-19 LAB — LACTIC ACID, PLASMA: LACTIC ACID, VENOUS: 0.6 mmol/L (ref 0.5–1.9)

## 2017-04-19 LAB — TSH: TSH: 0.462 u[IU]/mL (ref 0.350–4.500)

## 2017-04-19 LAB — PREPARE RBC (CROSSMATCH)

## 2017-04-19 LAB — FERRITIN: Ferritin: 558 ng/mL — ABNORMAL HIGH (ref 24–336)

## 2017-04-19 LAB — VITAMIN B12: VITAMIN B 12: 929 pg/mL — AB (ref 180–914)

## 2017-04-19 LAB — BRAIN NATRIURETIC PEPTIDE: B NATRIURETIC PEPTIDE 5: 513.1 pg/mL — AB (ref 0.0–100.0)

## 2017-04-19 LAB — APTT: aPTT: 26 seconds (ref 24–36)

## 2017-04-19 LAB — PROCALCITONIN: Procalcitonin: 0.12 ng/mL

## 2017-04-19 MED ORDER — ORAL CARE MOUTH RINSE
15.0000 mL | Freq: Two times a day (BID) | OROMUCOSAL | Status: DC
  Administered 2017-04-19: 15 mL via OROMUCOSAL

## 2017-04-19 MED ORDER — DEXTROSE 5 % IV SOLN
1.0000 g | INTRAVENOUS | Status: DC
  Filled 2017-04-19: qty 10

## 2017-04-19 MED ORDER — DEXTROSE 5 % IV SOLN
1.0000 g | INTRAVENOUS | Status: DC
  Administered 2017-04-19 – 2017-04-20 (×2): 1 g via INTRAVENOUS
  Filled 2017-04-19 (×3): qty 10

## 2017-04-19 MED ORDER — FUROSEMIDE 10 MG/ML IJ SOLN
20.0000 mg | Freq: Once | INTRAMUSCULAR | Status: AC
  Administered 2017-04-19: 20 mg via INTRAVENOUS
  Filled 2017-04-19: qty 2

## 2017-04-19 MED ORDER — CHLORHEXIDINE GLUCONATE 0.12 % MT SOLN
15.0000 mL | Freq: Two times a day (BID) | OROMUCOSAL | Status: DC
  Administered 2017-04-19 – 2017-04-21 (×5): 15 mL via OROMUCOSAL
  Filled 2017-04-19 (×5): qty 15

## 2017-04-19 MED ORDER — SODIUM CHLORIDE 0.9 % IV SOLN: Freq: Once | INTRAVENOUS | Status: DC

## 2017-04-19 NOTE — Progress Notes (Signed)
Pharmacy Antibiotic Note  [name] [name] is a 66 y.o. male admitted on 04/18/2017 with UTI.  Pharmacy has been consulted for ceftriaxone dosing.  Patient admitted for sepsis due to tachypnea, tachycardia and hypotension with new onset atrial flutter with RVR. Currently heart rate is controlled on diltiazem drip. Patient is afebrile and denies urinary symptoms. WBC, procalcitonin and lactate within normal limits. UA is positive for leukocytes and few bacteria, possibly asymptomatic bacteruria.    Plan: Continue ceftriaxone 1 gram q 24hr for no more than 5 days. Consider discontinuation if urine culture negative. Follow up urine and blood cultures Monitor clinical status  Height: 6\' 1"  (185.4 cm) Weight: 230 lb 6.4 oz (104.5 kg) IBW/kg (Calculated) : 79.9  Temp (24hrs), Avg:97.9 F (36.6 C), Min:97.4 F (36.3 C), Max:98.2 F (36.8 C)   Recent Labs Lab 04/18/17 1628 04/18/17 1820 04/19/17 0233 04/19/17 0403  WBC 4.5  --   --  4.4  CREATININE 2.36*  --   --  2.22*  LATICACIDVEN  --  1.61 0.6  --     Estimated Creatinine Clearance: 42.1 mL/min (A) (by C-G formula based on SCr of 2.22 mg/dL (H)).    Allergies  Allergen Reactions  . Amlodipine Besy-Benazepril Hcl Swelling    LIPS SWELL ANGIOEDEMA  . Percocet [Oxycodone-Acetaminophen] Other (See Comments)    HALLUCINATIONS    Antimicrobials this admission: Ceftriaxone 9/5>>  Microbiology results: 9/6 BCx: no growth 9/6 UCx: no growth  9/6 MRSA PCR: negative  Thank you for allowing pharmacy to be a part of this patient's care.   Charlene Brooke, PharmD PGY1 [place] Resident Pager: [phone] After 4:00PM please call [place] [phone] 04/19/2017 2:21 PM

## 2017-04-19 NOTE — Consult Note (Signed)
Cardiology Consultation:   Patient ID: [name] [name]; [id number]; Nov 30, 1950   Admit date: 04/18/2017 Date of Consult: 04/19/2017  Primary Care Provider: Nolene Ebbs, MD Primary Cardiologist: Dr. Gwenlyn Found Primary Electrophysiologist:     Patient Profile:   [name] [name] is a 66 y.o. male with a hx of COPD on home O2 with trach in place, chronic diastolic heart failure, CKD stage III, HTN, HLD, DM, GERD, CAD s/p CABG and subsequent stent placements who is being seen today for the evaluation of chest pain and Aflutter at the request of Dr. Quincy Simmonds.  History of Present Illness:   [name] [name] is known to this service and last saw Dr. Gwenlyn Found in clinic on 12/08/16. At that time he was in his usual state of health without medication changes.   He states that he visited his nephrologist and was found to have a heart rate in the 140s. He was taken to [facility] for further evaluation. Upon arrival, EKG and telemetry with Aflutter with RVR. He was started on diltiazem drip and titrated to 10 mg/hr, which has controlled his rate well.   On my interview, he reports feeling palpitations over the past month. While checking his pulseox at home, he reports rates in the 140s, but RVR quickly resolved to 80-90s. He did not seek evaluation at that time. He reports missing some doses of his evening lasix and 2 rounds of iron infusions. He at first mentioned some increasing shortness of breath with exertion, but then states his breathing has been at baseline. Trach in place with O2. At baseline, he has to sleep in a recliner due to his COPD. He denies chest pain, dizziness, and feelings of syncope.     Past Medical History:  Diagnosis Date  . Anemia   . Angina   . Anxiety   . Arthritis    gout  . CHF (congestive heart failure) ([place])   . Chronic airway obstruction, not elsewhere classified   . Chronic diastolic CHF (congestive heart failure) ([place]) 10/28/2007   Hospitalized with diastolic CHF controlled  with diuresis and BiPAP 2D April 2012 EF 55-60 with severe LVH and grade 1 diastolic dysfunction    . Chronic kidney disease    sees dr Louanna Raw every 6 months  . Chronic pulmonary heart disease, unspecified   . Chronic renal insufficiency, stage III (moderate)   . Chronic respiratory failure ([place])   . COPD (chronic obstructive pulmonary disease) ([place])   . Coronary atherosclerosis of unspecified type of vessel, native or graft   . GERD (gastroesophageal reflux disease)   . History of gout   . Hyperlipidemia   . Hypertension   . Obstructive sleep apnea 10/28/2007  . OSA (obstructive sleep apnea)   . PVD (peripheral vascular disease) ([place]) 07/13/2011   S/P Lt SFA PTA Sept 2009, known occluded Rt SFA   . PVD (peripheral vascular disease) ([place])   . S/P CABG (coronary artery bypass graft) x 6 2000 or 2001   Nuc, 09/17/2012 - Significant ST abnormalities that are suggestive of ischemia, low risk nuclear study  . Shortness of breath dyspnea    with activity  . Swelling of limb    Venous Duplex, 09/03/2012 - no evidence of thrombus or thrombophelitis  . TIA (transient ischemic attack)    2D Echo, 12/05/2010 - EF 55-60%, severe LVH  . Type 2 diabetes mellitus with vascular disease ([place]) 07/13/2011   Type 2   . Unspecified sleep apnea    uses oxygen 4  liter per minute all the time by trach    Past Surgical History:  Procedure Laterality Date  . Abdominal Aortogram  12/03/2002   70% stenosis at the origin of the profunda femoris, 75% mid superficial femoral artery stenosis  . APPENDECTOMY    . BILATERAL VATS ABLATION    . CARDIAC CATHETERIZATION  02/16/2010   RCA-80% stenosis stented with a 3x12 Taxus ION DES resulting in reduction of a total occlusion to 0%  . CARDIAC CATHETERIZATION  03/29/2009   Ostial/Proximal PDA-SVG-75% stenosis stented with a 3.0x23 Xience V DES reduction resulting in 75% stenosis to 0%  . CARDIAC CATHETERIZATION  08/05/2004   Circumflex OM stented with a 2.5x13 Cypher  stent resulting in reduction of 60% to 0%  . CARDIAC CATHETERIZATION  06/28/2000   Moderate left main and severe three-vessel disease, consider CABG  . CATARACT EXTRACTION    . COLONOSCOPY  08/11/2011   Procedure: COLONOSCOPY;  Surgeon: Missy Sabins, MD;  Location: [place];  Service: Endoscopy;  Laterality: N/A;  . COLONOSCOPY WITH PROPOFOL N/A 09/07/2014   Procedure: COLONOSCOPY WITH PROPOFOL;  Surgeon: Missy Sabins, MD;  Location: [facility] ENDOSCOPY;  Service: Endoscopy;  Laterality: N/A;  . CORONARY ANGIOPLASTY WITH STENT PLACEMENT     x 2  . CORONARY ARTERY BYPASS GRAFT  07/12/2000   x6,  . ESOPHAGOGASTRODUODENOSCOPY  08/11/2011   Procedure: ESOPHAGOGASTRODUODENOSCOPY (EGD);  Surgeon: Missy Sabins, MD;  Location[facility] ENDOSCOPY;  Service: Endoscopy;  Laterality: N/A;  . ESOPHAGOGASTRODUODENOSCOPY (EGD) WITH PROPOFOL N/A 09/07/2014   Procedure: ESOPHAGOGASTRODUODENOSCOPY (EGD) WITH PROPOFOL;  Surgeon: Missy Sabins, MD;  Location: [facility] ENDOSCOPY;  Service: Endoscopy;  Laterality: N/A;  . hemmorhoids    . lung sx  2005   Growth on outside of right lung  . PTCA  04/16/2008   Left SFA-60% stenosis stented with a 7x6 Nitinolself-expanding stent resulting in reduction of 60% mid segmental L SFA stenosis to 0% residual  . TRACHEOSTOMY TUBE PLACEMENT N/A 05/23/2013   Procedure: TRACHEOSTOMY;  Surgeon: Melida Quitter, MD;  Location: [place];  Service: ENT;  Laterality: N/A;     Home Medications:  Prior to Admission medications   Medication Sig Start Date End Date Taking? Authorizing Provider  acetaminophen (TYLENOL) 500 MG tablet Take 500 mg by mouth every 6 (six) hours as needed for mild pain or moderate pain.   Yes [provider]  albuterol (PROVENTIL) (2.5 MG/3ML) 0.083% nebulizer solution Take 3 mLs (2.5 mg total) by nebulization every 4 (four) hours as needed for wheezing or shortness of breath. 08/09/16  Yes Tanda Rockers, MD  allopurinol (ZYLOPRIM) 100 MG tablet Take 100 mg by mouth  daily.  10/08/13  Yes [provider]  arformoterol (BROVANA) 15 MCG/2ML NEBU Take 2 mLs (15 mcg total) by nebulization 2 (two) times daily. 05/24/16  Yes Parrett, Tammy S, NP  aspirin 81 MG tablet Take 81 mg by mouth daily.   Yes [provider]  budesonide (PULMICORT) 0.5 MG/2ML nebulizer solution USE ONE VIAL IN NEBULIZER TWICE DAILY 05/24/16  Yes Parrett, Tammy S, NP  clopidogrel (PLAVIX) 75 MG tablet TAKE ONE TABLET BY MOUTH ONCE DAILY Patient taking differently: TAKE ONE TABLET (75mg ) BY MOUTH ONCE DAILY 02/09/17  Yes Lorretta Harp, MD  COLCRYS 0.6 MG tablet Take 0.6 mg by mouth daily as needed (gout).  04/28/13  Yes [provider]  dextromethorphan-guaiFENesin (MUCINEX DM) 30-600 MG 12hr tablet Take 1 tablet by mouth 2 (two) times daily. Patient takes  for mucus relief   Yes [provider]  furosemide (LASIX) 40 MG tablet TAKE TWO TABLETS BY MOUTH IN THE MORNING AND ONE TABLET IN THE EVENING Patient taking differently: TAKE TWO TABLETS (80mg ) BY MOUTH IN THE MORNING AND ONE TABLET (40mg ) IN THE EVENING 03/14/17  Yes Lorretta Harp, MD  metolazone (ZAROXOLYN) 2.5 MG tablet TAKE ONE TABLET BY MOUTH ONCE DAILY ON  MONDAY,  WEDNESDAY,  AND  FRIDAY  30  MINUTES  BEFORE  LASIX Patient taking differently: TAKE ONE TABLET (2.5mg ) BY MOUTH ONCE DAILY ON  MONDAY,  WEDNESDAY,  AND  FRIDAY  30  MINUTES  BEFORE  LASIX 09/13/15  Yes Lorretta Harp, MD  nitroGLYCERIN (NITROSTAT) 0.4 MG SL tablet Place 1 tablet (0.4 mg total) under the tongue every 5 (five) minutes as needed. For chest pain 01/28/14  Yes Lorretta Harp, MD    Inpatient Medications: Scheduled Meds: . allopurinol  100 mg Oral Daily  . arformoterol  15 mcg Nebulization BID  . aspirin EC  81 mg Oral Daily  . budesonide  0.5 mg Nebulization BID  . chlorhexidine  15 mL Mouth Rinse BID  . clopidogrel  75 mg Oral Daily  . dextromethorphan-guaiFENesin  1 tablet Oral BID  . heparin subcutaneous  5,000  Units Subcutaneous Q8H  . mouth rinse  15 mL Mouth Rinse q12n4p   Continuous Infusions: . sodium chloride 50 mL/hr at 04/19/17 0659  . cefTRIAXone (ROCEPHIN)  IV    . diltiazem (CARDIZEM) infusion 10 mg/hr (04/19/17 0659)   PRN Meds: acetaminophen, albuterol, colchicine, nitroGLYCERIN, ondansetron (ZOFRAN) IV, zolpidem  Allergies:    Allergies  Allergen Reactions  . Amlodipine Besy-Benazepril Hcl Swelling    LIPS SWELL ANGIOEDEMA  . Percocet [Oxycodone-Acetaminophen] Other (See Comments)    HALLUCINATIONS    Social History:   Social History   Social History  . Marital status: Married    Spouse name: N/A  . Number of children: N/A  . Years of education: N/A   Occupational History  . Not on file.   Social History Main Topics  . Smoking status: Former Smoker    Packs/day: 1.00    Years: 25.00    Types: Cigarettes    Quit date: 08/14/1998  . Smokeless tobacco: Never Used  . Alcohol use No  . Drug use: No  . Sexual activity: Yes   Other Topics Concern  . Not on file   Social History Narrative  . No narrative on file    Family History:    Family History  Problem Relation Age of Onset  . Diabetes Sister   . Hypertension Sister   . Cancer Sister   . Heart attack Neg Hx   . Stroke Neg Hx      ROS:  Please see the history of present illness.  ROS  All other ROS reviewed and negative.     Physical Exam/Data:   Vitals:   04/18/17 2330 04/19/17 0100 04/19/17 0352 04/19/17 0754  BP: 124/80 119/66 107/66 112/75  Pulse: (!) 111 (!) 115 (!) 101 88  Resp: 19 19 (!) 21 19  Temp:  98.2 F (36.8 C) 97.9 F (36.6 C) (!) 97.4 F (36.3 C)  TempSrc:  Oral Oral Oral  SpO2: 94% 95% 97% 98%  Weight:  230 lb 6.4 oz (104.5 kg)    Height:  6\' 1"  (1.854 m)      Intake/Output Summary (Last 24 hours) at 04/19/17 0858 Last data filed at 04/19/17  6222  Gross per 24 hour  Intake          1345.58 ml  Output              600 ml  Net           745.58 ml   Filed Weights    04/19/17 0100  Weight: 230 lb 6.4 oz (104.5 kg)   Body mass index is 30.4 kg/m.  General:  Well nourished, well developed, in no acute distress HEENT: Tracheostomy Lymph: no adenopathy Neck: exam difficult with trach in place Cardiac:  Irregular rhythm, regular rate, 1/6 systolic murmur appreciated Lungs:  Trach in place, diminished sounds in bases, exam difficult with high flow oxygen Abd: soft, nontender, no hepatomegaly  Ext: B LE edema Musculoskeletal:  No deformities, BUE and BLE strength normal and equal Skin: warm and dry  Psych:  Normal affect   EKG:  The EKG was personally reviewed and demonstrates:  Atrial flutter, rate controlled, RBBB (old) Telemetry:  Telemetry was personally reviewed and demonstrates:  Atrial flutter in the 80s  Relevant CV Studies:  Echocardiogram pending  Echocardiogram 05/21/13: Study Conclusions - Left ventricle: The cavity size was mildly dilated. Wall thickness was increased in a pattern of mild LVH. Systolic function was normal. The estimated ejection fraction was in the range of 55% to 60%. Wall motion was normal; there were no regional wall motion abnormalities. Doppler parameters are consistent with abnormal left ventricular relaxation (grade 1 diastolic dysfunction). - Ventricular septum: Septal motion showed abnormal function and dyssynergy. - Aortic valve: Trileaflet; mildly thickened, moderately calcified leaflets. There was mild stenosis. Trivial regurgitation. Valve area: 1.53cm^2(VTI). Valve area: 1.67cm^2 (Vmax). - Mitral valve: Mildly calcified annulus. - Left atrium: The atrium was moderately dilated. - Atrial septum: No defect or patent foramen ovale was identified.   Nuc med study 09/17/12: Low risk study, some diaphragmatic attenuation   Laboratory Data:  Chemistry Recent Labs Lab 04/18/17 1628 04/19/17 0403  NA 138 138  K 4.0 3.7  CL 81* 86*  CO2 45* 38*  GLUCOSE 81 107*  BUN 78*  74*  CREATININE 2.36* 2.22*  CALCIUM 9.0 8.8*  GFRNONAA 27* 29*  GFRAA 32* 34*  ANIONGAP 12 14     Recent Labs Lab 04/18/17 1814  PROT 7.3  ALBUMIN 3.6  AST 24  ALT 9*  ALKPHOS 71  BILITOT 1.0   Hematology Recent Labs Lab 04/18/17 1628 04/19/17 0233 04/19/17 0403  WBC 4.5  --  4.4  RBC 2.93* 2.82* 2.72*  HGB 7.5*  --  7.2*  HCT 27.9*  --  25.4*  MCV 95.2  --  93.4  MCH 25.6*  --  26.5  MCHC 26.9*  --  28.3*  RDW 14.5  --  15.2  PLT 138*  --  111*   Cardiac Enzymes Recent Labs Lab 04/19/17 0233 04/19/17 0403  TROPONINI 0.04* 0.04*    Recent Labs Lab 04/18/17 1639  TROPIPOC 0.02    BNP Recent Labs Lab 04/19/17 0233  BNP 513.1*    DDimer No results for input(s): DDIMER in the last 168 hours.  Radiology/Studies:  Dg Chest 2 View  Result Date: 04/18/2017 CLINICAL DATA:  Tachycardia and hypotension. EXAM: CHEST  2 VIEW COMPARISON:  February 26, 2017 ; November 17, 2014 FINDINGS: Tracheostomy catheter tip is 6.4 cm above the carina. No pneumothorax. 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 There are bilateral pleural effusions. There is moderate  interstitial edema bilaterally. There is no airspace consolidation. There is cardiomegaly with pulmonary venous hypertension. There is aortic atherosclerosis. Patient is status post coronary artery bypass grafting. No evident bone lesions. IMPRESSION: Tracheostomy as described without evident pneumothorax. Evidence of congestive heart failure without airspace consolidation. There is aortic atherosclerosis. 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 Aortic Atherosclerosis (ICD10-I70.0). Electronically Signed   By: Lowella Grip III M.D.   On: 04/18/2017 17:09    Assessment and Plan:   1. Elevated troponin, hx of CAD s/p CABG x 6 (2001), PCI with stent to the OM branch (07/2004), PDA vein graft stent 7/10 with an OM circumflex DS 8/10 and a RCA  vein graft stent 7/11, nuc in 2014 low risk  - troponin 0.02 --> 0.04 --> 0.04 - EKG with atrial flutter, RBBB - echo in 2014 with preserved LVEF and grade 1 DD - pt is not having active chest pain and denies recent chest pain, troponin trend not suggestive of ACS, will hold off on ischemic evaluation at this time - home ASA and plavix - low and flat troponin likely secondary to RVR   2. Atrial flutter - confirmed with EKG - rate controlled on IV diltiazem - This patients CHA2DS2-VASc Score and unadjusted Ischemic Stroke Rate (% per year) is equal to 9.7 % stroke rate/year from a score of 6 (HTN, CHF, DM, age, TIA) - echocardiogram pending Atrial flutter may be driven by COPD exacerbation, UTI/sepsis, heart failure exacerbation (missed some doses of lasix), or anemia (missed iron infusions - Hb 7.2). He is tolerating the rhythm well and has likely been having at least paroxysmal episodes of flutter with RVR for the last month. He is hemodynamically stable. It is unclear if he can tolerate anticoagulation at this time given his Hb of 7.2. Will defer to primary team on when to start [facility]. Not a DCCV candidate until he can tolerate anticoagulation. Would recommend 5 mg eliquis BID. If active bleed is ruled out and Hb improves, will consider cardioversion after 3 doses of eliquis. Will consider D/C ASA  if eliquis is started.   3. Anemia - Hb down to 7.2; baseline appears to be 9 - has missed some doses of iron - recommend transfusing at least 1U PRBC to keep Hb above 8   3. HTN - controlled on home diuretic regimen   4. HLD - LDL 99, cholesterol 159, HDL 46, triglycerides 69   5. Chronic diastolic heart failure - home lasix 80 in AM, 40 in PM, zaroxolyn on MWF - weight at last clinic visit was 243 lbs - weight this admission 230 lbs - need to establish dry weight - resume home dose of lasix and zaroxolyn when sepsis has been ruled out; careful with IVF hydration. Would recommend  transitioning lasix to IV for the short term for better diuresis. Currently overall net positive 1L.   6. CKD stage III - GFR 40 - sCr 2.22, baseline appears to be 2.36-1.52 - he is making urine   7. DM - last A1c 5.1   8. COPD - on home O2, trach in place - not an amiodarone candidate   9. Hx of TIA - no residual deficits    Signed, Ledora Bottcher, [place]  04/19/2017 8:58 AM  Personally seen and examined. Agree with above.  Complex 66 year old male with chronic tracheostomy, COPD, diabetes well controlled, chronic kidney disease, history of TIA, chronic anemia receiving iron infusions, chronic CAD status post  CABG with new discovery of atrial flutter rapid ventricular response.  Exam reveals an alert male with tracheostomy in place, able to vocalize, lungs clear, heart irregularly irregular Atrial flutter  - Able to rate control with diltiazem. We will consolidate to by mouth from 10 IV.  - Echocardiogram personally viewed demonstrates normal-appearing ejection fraction on preliminary parasternal glance.  - Mild to moderate aortic stenosis present.  - Given his CHADSVASc of at least 5, he deserves anticoagulation to markedly reduce his stroke risks. I am well aware of his chronic anemia however he does not have any current active signs of bleeding.   - Would benefit from 1 unit transfusion of blood. We will like to see hemoglobin greater than 8. His baseline is 9.  - I think starting Eliquis 5 mg twice a day would be a good option for him taking into account his chronic kidney disease stage III. We will have pharmacy weigh in on the decision as well.  - If he does not convert after 3 doses of Eliquis, we can always proceed with transesophageal echocardiogram and DC cardioversion if he is showing any signs of hemodynamic instability. Currently his blood pressures are reasonable and his heart rate is well controlled with diltiazem. I would envision that we would be able to rate  control him without need for urgent cardioversion.  - If we are starting Eliquis, we should stop both his aspirin and Plavix given the chronic stable nature of his coronary artery disease and his chronic anemia to reduce his bleeding risks.  Chronic diastolic heart failure  - Does not appear to be volume overloaded on exam. He is on high-dose Lasix as well as metolazone. No changes.  CAD/CABG  - Stable, no anginal symptoms.  We will continue to follow.

## 2017-04-19 NOTE — Progress Notes (Signed)
Nutrition Brief Note  Patient identified on the Malnutrition Screening Tool (MST) Report. Patient with gradual weight loss over the past year. 12% weight loss in a year, 5% weight loss in 3 months, 2% weight loss in 1.5 months are all insignificant amounts of weight loss. Patient says he has not really been trying to lose weight, but he has been eating smaller portions.He is happy with the weight loss and would like to lose a little more. Nutrition focused physical exam completed.  No muscle or subcutaneous fat depletion noticed.   Wt Readings from Last 15 Encounters:  04/19/17 230 lb 6.4 oz (104.5 kg)  03/02/17 235 lb (106.6 kg)  02/26/17 237 lb 12.8 oz (107.9 kg)  02/23/17 239 lb (108.4 kg)  12/12/16 244 lb 6.4 oz (110.9 kg)  12/08/16 243 lb (110.2 kg)  08/09/16 248 lb (112.5 kg)  06/01/16 260 lb (117.9 kg)  05/25/16 260 lb (117.9 kg)  05/24/16 261 lb (118.4 kg)  01/14/16 268 lb (121.6 kg)  01/13/16 264 lb (119.7 kg)  01/06/16 270 lb (122.5 kg)  12/07/15 271 lb (122.9 kg)  11/19/15 270 lb (122.5 kg)    Body mass index is 30.4 kg/m. Patient meets criteria for obesity, class 1, based on current BMI.   Current diet order is heart healthy, patient is consuming approximately 100% of meals at this time. Labs and medications reviewed.   No nutrition interventions warranted at this time. If nutrition issues arise, please consult RD.   Molli Barrows, RD, LDN, [place] Pager [phone] After Hours Pager [phone]

## 2017-04-19 NOTE — Care Management Note (Addendum)
Case Management Note  Patient Details  Name: [name] [name] MRN: [id number] Date of Birth: 1950-11-12  Subjective/Objective:  Pt presented for new onset atrial flutter. Benefits Check in process for Eliquis.   Action/Plan: CM will make pt aware of cost once completed.   Expected Discharge Date:                  Expected Discharge Plan:  Home/Self Care  In-House Referral:  NA  Discharge planning Services  CM Consult, Medication Assistance  Post Acute Care Choice:  NA Choice offered to:  NA  DME Arranged:  N/A DME Agency:  NA  HH Arranged:  NA HH Agency:  NA  Status of Service:  Completed, signed off  If discussed at [place] of Stay Meetings, dates discussed:    Additional Comments: 0923 04-20-17 Jacqlyn Krauss, RN,BSN [phone] Pt copay will be $45, prior auth not required. Pt has met their deductible. 30 day free card was given to patient. Pt has ventilator at home via [facility]. Pt has portable 02 tank for travel home in the closet in room. Wife to transport home. Pt will use [facility] for medications.  Bethena Roys, RN 04/19/2017, 4:04 PM

## 2017-04-19 NOTE — Progress Notes (Signed)
  Echocardiogram 2D Echocardiogram has been performed.  [name] [name] 04/19/2017, 10:47 AM

## 2017-04-19 NOTE — Progress Notes (Signed)
CRITICAL VALUE ALERT  Critical Value:  Troponin 0.04  Date & Time Notied:  04/19/2017 0355  Provider Notified: [place]  Orders Received/Action taken:

## 2017-04-19 NOTE — Progress Notes (Addendum)
PROGRESS NOTE [facility]   ELEANOR DIMICHELE   ZYS:[id number] DOB: 10-25-1950  DOA: 04/18/2017 PCP: Nolene Ebbs, MD   Brief Narrative:  [name] [name] a 66 year old male with complex medical history including chronic trach, COPD, diabetes mellitus well controlled, chronic kidney disease history of TIA, chronic anemia receiving iron infusion, chronic CAD status post CABG who presented to the emergency department complaining of palpitations and pain with urination.patient was at his renal doctor office for regular checkup where he developed the patient was found to have rapid ventricular response with heart rate up to the 140s. He was also noted to be mild shortness of breath and was sent to the emergency department for further evaluation. Upon evaluation his blood pressure was noted to be in the low 80s which improved after IV fluids. His EKG was found to have new onset atrial flutter/A. Fib with RVR for which she started on Cardizem drip. Cardiology was consulted and admitted for further evaluation.  Subjective: Patient seen and examined report his breathing is at baseline, report palpitations has been resolved. Denies chest pain, dizziness and weakness.  Assessment & Plan: Atrial flutter/A. Fib with RVR - CHADSVASc at least 5  Cardiology input appreciated Patient was initially treated with Cardizem drip now switch to oral formulation. Agree with cardiology on oral anticoagulation, no signs of active bleeding and he has chronic iron deficiency Will transfuse 1 pack of PRBC, start Eliquis in the morning and discontinue Plavix and aspirin Continue management per cardiology recommendations  Elevated troponin Likely from demand ischemia from rapid ventricular response and anemia Cardiology the following  Sepsis secondary to UTI Sepsis physiology has resolved Continue IV antibiotics Follow-up urine and blood cultures  Chronic respiratory failure with hypoxia and hypercapnia  due to COPD and OSA Trach dependent Respiratory status is baseline Continue to monitor  Chronic diastolic heart failure Seems to be compensated at this time He is on Lasix and metolazone Continue current regimen  Chronic iron deficiency anemia FOBT pending Will transfuse 1 unit of PRBCs Will give iron infusion in the morning  DVT prophylaxis: SQ heparin  Code Status:  FULL  Family Communication: None at bedside  Disposition Plan: Home when medically stable    Consultants:   cardiology  Procedures:   Echo 04/19/2017 ------------------------------------------------------------------- Study Conclusions  - Left ventricle: Wall thickness was increased in a pattern of   moderate LVH. Systolic function was normal. The estimated   ejection fraction was in the range of 55% to 60%. The study is   not technically sufficient to allow evaluation of LV diastolic   function. - Aortic valve: AV is thckened with mildly restricted motion. Peak   and mean gradients through the valve are 32 and 14 mm Hg   respectively. - Left atrium: The atrium was mildly dilated. - Right ventricle: The cavity size was mildly dilated. Wall   thickness was normal. - Right atrium: The atrium was mildly dilated. - Pulmonary arteries: PA peak pressure: 42 mm Hg (S).  Antimicrobials: Anti-infectives    Start     Dose/Rate Route Frequency Ordered Stop   04/19/17 2200  cefTRIAXone (ROCEPHIN) 1 g in dextrose 5 % 50 mL IVPB  Status:  Discontinued     1 g 100 mL/hr over 30 Minutes Intravenous  Once 04/18/17 2144 04/18/17 2220   04/19/17 2000  cefTRIAXone (ROCEPHIN) 1 g in dextrose 5 % 50 mL IVPB  Status:  Discontinued     1 g 100 mL/hr over 30 Minutes Intravenous  Once 04/18/17 2234 04/19/17 1420   04/19/17 2000  cefTRIAXone (ROCEPHIN) 1 g in dextrose 5 % 50 mL IVPB     1 g 100 mL/hr over 30 Minutes Intravenous Every 24 hours 04/19/17 1423 04/23/17 1959   04/19/17 1430  cefTRIAXone (ROCEPHIN) 1 g in  dextrose 5 % 50 mL IVPB  Status:  Discontinued     1 g 100 mL/hr over 30 Minutes Intravenous Every 24 hours 04/19/17 1420 04/19/17 1423   04/18/17 2230  cefTRIAXone (ROCEPHIN) 1 g in dextrose 5 % 50 mL IVPB  Status:  Discontinued     1 g 100 mL/hr over 30 Minutes Intravenous  Once 04/18/17 2220 04/18/17 2234   04/18/17 1945  cefTRIAXone (ROCEPHIN) 1 g in dextrose 5 % 50 mL IVPB     1 g 100 mL/hr over 30 Minutes Intravenous  Once 04/18/17 1932 04/18/17 2036          Objective: Vitals:   04/19/17 1043 04/19/17 1045 04/19/17 1055 04/19/17 1100  BP:      Pulse: 90   89  Resp: 20   (!) 28  Temp:      TempSrc:      SpO2: 98% 98% 100% 100%  Weight:      Height:        Intake/Output Summary (Last 24 hours) at 04/19/17 1510 Last data filed at 04/19/17 1443  Gross per 24 hour  Intake          1750.66 ml  Output             1135 ml  Net           615.66 ml   Filed Weights   04/19/17 0100  Weight: 104.5 kg (230 lb 6.4 oz)    Examination:  General exam: NAV, with tracheostomy in place, able to vocalize HEENT: AC/AT, PERRLA, OP moist and clear Respiratory system: good air entry, no wheezing or rales Cardiovascular system: S1 & S2 heard,irregular irregular.systolic murmur Gastrointestinal system: Abdomen is nondistended, soft and nontender.  Central nervous system: Alert and oriented. No focal neurological deficits. Extremities: bilateral lower extremity edema. Symmetric  Skin: No rashes, lesions or ulcers Psychiatry: Judgement and insight appear normal. Mood & affect appropriate.    Data Reviewed: I have personally reviewed following labs and imaging studies  CBC:  Recent Labs Lab 04/18/17 1628 04/19/17 0403  WBC 4.5 4.4  HGB 7.5* 7.2*  HCT 27.9* 25.4*  MCV 95.2 93.4  PLT 138* 161*   Basic Metabolic Panel:  Recent Labs Lab 04/18/17 1628 04/19/17 0403  NA 138 138  K 4.0 3.7  CL 81* 86*  CO2 45* 38*  GLUCOSE 81 107*  BUN 78* 74*  CREATININE 2.36* 2.22*    CALCIUM 9.0 8.8*   GFR: Estimated Creatinine Clearance: 42.1 mL/min (A) (by C-G formula based on SCr of 2.22 mg/dL (H)). Liver Function Tests:  Recent Labs Lab 04/18/17 1814  AST 24  ALT 9*  ALKPHOS 71  BILITOT 1.0  PROT 7.3  ALBUMIN 3.6    Recent Labs Lab 04/18/17 1814  LIPASE 31   No results for input(s): AMMONIA in the last 168 hours. Coagulation Profile:  Recent Labs Lab 04/19/17 0233  INR 0.99   Cardiac Enzymes:  Recent Labs Lab 04/19/17 0233 04/19/17 0403 04/19/17 1046  TROPONINI 0.04* 0.04* 0.03*   BNP (last 3 results) No results for input(s): PROBNP in the last 8760 hours. HbA1C:  Recent Labs  04/19/17 0233  HGBA1C 5.1  CBG: No results for input(s): GLUCAP in the last 168 hours. Lipid Profile:  Recent Labs  04/19/17 0403  CHOL 159  HDL 46  LDLCALC 99  TRIG 69  CHOLHDL 3.5   Thyroid Function Tests:  Recent Labs  04/19/17 0233 04/19/17 0403  TSH 0.462  --   FREET4  --  1.32*   Anemia Panel:  Recent Labs  04/19/17 0233  VITAMINB12 929*  FOLATE 8.5  FERRITIN 558*  TIBC 277  IRON 30*  RETICCTPCT 1.0   Sepsis Labs:  Recent Labs Lab 04/18/17 1820 04/19/17 0233  PROCALCITON  --  0.12  LATICACIDVEN 1.61 0.6    Recent Results (from the past 240 hour(s))  Urine culture     Status: Abnormal   Collection Time: 04/18/17  6:30 PM  Result Value Ref Range Status   Specimen Description URINE, RANDOM  Final   Special Requests NONE  Final   Culture MULTIPLE SPECIES PRESENT, SUGGEST RECOLLECTION (A)  Final   Report Status 04/19/2017 FINAL  Final  MRSA PCR Screening     Status: None   Collection Time: 04/19/17 12:46 AM  Result Value Ref Range Status   MRSA by PCR NEGATIVE NEGATIVE Final    Comment:        The GeneXpert MRSA Assay (FDA approved for NASAL specimens only), is one component of a comprehensive MRSA colonization surveillance program. It is not intended to diagnose MRSA infection nor to guide or monitor  treatment for MRSA infections.       Radiology Studies: Dg Chest 2 View  Result Date: 04/18/2017 CLINICAL DATA:  Tachycardia and hypotension. EXAM: CHEST  2 VIEW COMPARISON:  February 26, 2017 ; November 17, 2014 FINDINGS: Tracheostomy catheter tip is 6.4 cm above the carina. No pneumothorax. 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 There are bilateral pleural effusions. There is moderate interstitial edema bilaterally. There is no airspace consolidation. There is cardiomegaly with pulmonary venous hypertension. There is aortic atherosclerosis. Patient is status post coronary artery bypass grafting. No evident bone lesions. IMPRESSION: Tracheostomy as described without evident pneumothorax. Evidence of congestive heart failure without airspace consolidation. There is aortic atherosclerosis. 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 Aortic Atherosclerosis (ICD10-I70.0). Electronically Signed   By: Lowella Grip III M.D.   On: 04/18/2017 17:09    Scheduled Meds: . allopurinol  100 mg Oral Daily  . arformoterol  15 mcg Nebulization BID  . aspirin EC  81 mg Oral Daily  . budesonide  0.5 mg Nebulization BID  . chlorhexidine  15 mL Mouth Rinse BID  . clopidogrel  75 mg Oral Daily  . dextromethorphan-guaiFENesin  1 tablet Oral BID  . heparin subcutaneous  5,000 Units Subcutaneous Q8H  . mouth rinse  15 mL Mouth Rinse q12n4p   Continuous Infusions: . sodium chloride 50 mL/hr at 04/19/17 0659  . cefTRIAXone (ROCEPHIN)  IV    . diltiazem (CARDIZEM) infusion 10 mg/hr (04/19/17 1509)     LOS: 1 day    Time spent: Total of 35 minutes spent with pt, greater than 50% of which was spent in discussion of  treatment, counseling and coordination of care    Chipper Oman, MD Pager: Text Page via [email or web address]   If 7PM-7AM, please contact night-coverage [email or web address] 04/19/2017, 3:10 PM

## 2017-04-20 DIAGNOSIS — J9612 Chronic respiratory failure with hypercapnia: Secondary | ICD-10-CM

## 2017-04-20 DIAGNOSIS — J9611 Chronic respiratory failure with hypoxia: Secondary | ICD-10-CM

## 2017-04-20 LAB — CBC
HEMATOCRIT: 28.2 % — AB (ref 39.0–52.0)
Hemoglobin: 8 g/dL — ABNORMAL LOW (ref 13.0–17.0)
MCH: 26.4 pg (ref 26.0–34.0)
MCHC: 28.4 g/dL — AB (ref 30.0–36.0)
MCV: 93.1 fL (ref 78.0–100.0)
PLATELETS: 132 10*3/uL — AB (ref 150–400)
RBC: 3.03 MIL/uL — ABNORMAL LOW (ref 4.22–5.81)
RDW: 15.3 % (ref 11.5–15.5)
WBC: 5 10*3/uL (ref 4.0–10.5)

## 2017-04-20 LAB — T3, FREE: T3, Free: 2.3 pg/mL (ref 2.0–4.4)

## 2017-04-20 LAB — TYPE AND SCREEN
ABO/RH(D): O POS
ANTIBODY SCREEN: NEGATIVE
UNIT DIVISION: 0

## 2017-04-20 LAB — BPAM RBC
Blood Product Expiration Date: 201810042359
ISSUE DATE / TIME: 201809061700
UNIT TYPE AND RH: 5100

## 2017-04-20 LAB — BASIC METABOLIC PANEL
Anion gap: 10 (ref 5–15)
BUN: 70 mg/dL — AB (ref 6–20)
CHLORIDE: 83 mmol/L — AB (ref 101–111)
CO2: 42 mmol/L — AB (ref 22–32)
CREATININE: 2.31 mg/dL — AB (ref 0.61–1.24)
Calcium: 8.8 mg/dL — ABNORMAL LOW (ref 8.9–10.3)
GFR calc Af Amer: 32 mL/min — ABNORMAL LOW (ref >60)
GFR calc non Af Amer: 28 mL/min — ABNORMAL LOW (ref >60)
GLUCOSE: 118 mg/dL — AB (ref 65–99)
POTASSIUM: 3.7 mmol/L (ref 3.5–5.1)
Sodium: 135 mmol/L (ref 135–145)

## 2017-04-20 LAB — GLUCOSE, CAPILLARY: Glucose-Capillary: 124 mg/dL — ABNORMAL HIGH (ref 65–99)

## 2017-04-20 MED ORDER — SODIUM CHLORIDE 0.9 % IV SOLN
125.0000 mg | Freq: Once | INTRAVENOUS | Status: AC
  Administered 2017-04-20: 125 mg via INTRAVENOUS
  Filled 2017-04-20: qty 10

## 2017-04-20 MED ORDER — BISACODYL 5 MG PO TBEC
10.0000 mg | DELAYED_RELEASE_TABLET | Freq: Once | ORAL | Status: AC
  Administered 2017-04-20: 10 mg via ORAL
  Filled 2017-04-20: qty 2

## 2017-04-20 MED ORDER — APIXABAN 5 MG PO TABS
5.0000 mg | ORAL_TABLET | Freq: Two times a day (BID) | ORAL | Status: DC
  Administered 2017-04-20 – 2017-04-21 (×3): 5 mg via ORAL
  Filled 2017-04-20 (×3): qty 1

## 2017-04-20 MED ORDER — POLYETHYLENE GLYCOL 3350 17 G PO PACK
17.0000 g | PACK | Freq: Every day | ORAL | Status: DC
  Administered 2017-04-20: 17 g via ORAL
  Filled 2017-04-20 (×2): qty 1

## 2017-04-20 MED ORDER — DILTIAZEM HCL 100 MG IV SOLR
5.0000 mg/h | INTRAVENOUS | Status: DC
  Administered 2017-04-20: 10 mg/h via INTRAVENOUS
  Filled 2017-04-20: qty 100

## 2017-04-20 MED ORDER — FUROSEMIDE 80 MG PO TABS
80.0000 mg | ORAL_TABLET | Freq: Every day | ORAL | Status: DC
  Administered 2017-04-20 – 2017-04-21 (×2): 80 mg via ORAL
  Filled 2017-04-20 (×2): qty 1

## 2017-04-20 MED ORDER — DILTIAZEM HCL ER COATED BEADS 240 MG PO CP24
240.0000 mg | ORAL_CAPSULE | Freq: Every day | ORAL | Status: DC
  Administered 2017-04-20 – 2017-04-21 (×2): 240 mg via ORAL
  Filled 2017-04-20 (×2): qty 1

## 2017-04-20 NOTE — Progress Notes (Signed)
ANTICOAGULATION CONSULT NOTE - Initial Consult  Pharmacy Consult for apixaban Indication: atrial fibrillation  Allergies  Allergen Reactions  . Amlodipine Besy-Benazepril Hcl Swelling    LIPS SWELL ANGIOEDEMA  . Percocet [Oxycodone-Acetaminophen] Other (See Comments)    HALLUCINATIONS    Patient Measurements: Height: 6\' 1"  (185.4 cm) Weight: 232 lb 8 oz (105.5 kg) IBW/kg (Calculated) : 79.9   Vital Signs: Temp: 97.7 F (36.5 C) (09/07 0831) Temp Source: Oral (09/07 0831) BP: 102/68 (09/07 0831) Pulse Rate: 90 (09/07 0911)  Labs:  Recent Labs  04/18/17 1628 04/19/17 0233 04/19/17 0403 04/19/17 1046 04/20/17 0332  HGB 7.5*  --  7.2*  --  8.0*  HCT 27.9*  --  25.4*  --  28.2*  PLT 138*  --  111*  --  132*  APTT  --  26  --   --   --   LABPROT  --  13.0  --   --   --   INR  --  0.99  --   --   --   CREATININE 2.36*  --  2.22*  --  2.31*  TROPONINI  --  0.04* 0.04* 0.03*  --     Estimated Creatinine Clearance: 40.6 mL/min (A) (by C-G formula based on SCr of 2.31 mg/dL (H)).   Medical History: Past Medical History:  Diagnosis Date  . Anemia   . Angina   . Anxiety   . Arthritis    gout  . CHF (congestive heart failure) ([place])   . Chronic airway obstruction, not elsewhere classified   . Chronic diastolic CHF (congestive heart failure) ([place]) 10/28/2007   Hospitalized with diastolic CHF controlled with diuresis and BiPAP 2D April 2012 EF 55-60 with severe LVH and grade 1 diastolic dysfunction    . Chronic kidney disease    sees dr Louanna Raw every 6 months  . Chronic pulmonary heart disease, unspecified   . Chronic renal insufficiency, stage III (moderate)   . Chronic respiratory failure ([place])   . COPD (chronic obstructive pulmonary disease) ([place])   . Coronary atherosclerosis of unspecified type of vessel, native or graft   . GERD (gastroesophageal reflux disease)   . History of gout   . Hyperlipidemia   . Hypertension   . Obstructive sleep apnea 10/28/2007   . OSA (obstructive sleep apnea)   . PVD (peripheral vascular disease) ([place]) 07/13/2011   S/P Lt SFA PTA Sept 2009, known occluded Rt SFA   . PVD (peripheral vascular disease) ([place])   . S/P CABG (coronary artery bypass graft) x 6 2000 or 2001   Nuc, 09/17/2012 - Significant ST abnormalities that are suggestive of ischemia, low risk nuclear study  . Shortness of breath dyspnea    with activity  . Swelling of limb    Venous Duplex, 09/03/2012 - no evidence of thrombus or thrombophelitis  . TIA (transient ischemic attack)    2D Echo, 12/05/2010 - EF 55-60%, severe LVH  . 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 Unspecified sleep apnea    uses oxygen 4 liter per minute all the time by trach    Medications:  Prescriptions Prior to Admission  Medication Sig Dispense Refill Last Dose  . acetaminophen (TYLENOL) 500 MG tablet Take 500 mg by mouth every 6 (six) hours as needed for mild pain or moderate pain.    at prn  . albuterol (PROVENTIL) (2.5 MG/3ML) 0.083% nebulizer solution Take 3 mLs (2.5 mg total)  by nebulization every 4 (four) hours as needed for wheezing or shortness of breath.   04/17/2017 at Unknown time  . allopurinol (ZYLOPRIM) 100 MG tablet Take 100 mg by mouth daily.    04/17/2017 at Unknown time  . arformoterol (BROVANA) 15 MCG/2ML NEBU Take 2 mLs (15 mcg total) by nebulization 2 (two) times daily. 75 mL 5 04/17/2017 at Unknown time  . aspirin 81 MG tablet Take 81 mg by mouth daily.   04/17/2017 at Unknown time  . budesonide (PULMICORT) 0.5 MG/2ML nebulizer solution USE ONE VIAL IN NEBULIZER TWICE DAILY 75 mL 5 04/17/2017 at Unknown time  . clopidogrel (PLAVIX) 75 MG tablet TAKE ONE TABLET BY MOUTH ONCE DAILY (Patient taking differently: TAKE ONE TABLET (75mg ) BY MOUTH ONCE DAILY) 30 tablet 6 04/17/2017 at Unknown time  . COLCRYS 0.6 MG tablet Take 0.6 mg by mouth daily as needed (gout).     at prn  . dextromethorphan-guaiFENesin (MUCINEX DM) 30-600 MG 12hr  tablet Take 1 tablet by mouth 2 (two) times daily. Patient takes for mucus relief   04/17/2017 at Unknown time  . furosemide (LASIX) 40 MG tablet TAKE TWO TABLETS BY MOUTH IN THE MORNING AND ONE TABLET IN THE EVENING (Patient taking differently: TAKE TWO TABLETS (80mg ) BY MOUTH IN THE MORNING AND ONE TABLET (40mg ) IN THE EVENING) 90 tablet 9 04/17/2017 at Unknown time  . metolazone (ZAROXOLYN) 2.5 MG tablet TAKE ONE TABLET BY MOUTH ONCE DAILY ON  MONDAY,  WEDNESDAY,  AND  FRIDAY  30  MINUTES  BEFORE  LASIX (Patient taking differently: TAKE ONE TABLET (2.5mg ) BY MOUTH ONCE DAILY ON  MONDAY,  WEDNESDAY,  AND  FRIDAY  30  MINUTES  BEFORE  LASIX) 30 tablet 3 Past Week at Unknown time  . nitroGLYCERIN (NITROSTAT) 0.4 MG SL tablet Place 1 tablet (0.4 mg total) under the tongue every 5 (five) minutes as needed. For chest pain 25 tablet 6  at prn   Scheduled:  . allopurinol  100 mg Oral Daily  . apixaban  5 mg Oral BID  . arformoterol  15 mcg Nebulization BID  . bisacodyl  10 mg Oral Once  . budesonide  0.5 mg Nebulization BID  . chlorhexidine  15 mL Mouth Rinse BID  . dextromethorphan-guaiFENesin  1 tablet Oral BID  . diltiazem  240 mg Oral Daily  . mouth rinse  15 mL Mouth Rinse q12n4p  . polyethylene glycol  17 g Oral Daily    Assessment: 66 yo male with afib to start apixiban (he was on aspirin and plavix with last PCI in 7/11).  -wt= 105kg, SCr= 2.31 (basline ~ 2.1-2.3).   Goal of Therapy:  Monitor platelets by anticoagulation protocol: Yes   Plan:  -apixaban 5mg  po bid -Will provide patient education  Hildred Laser, Pharm D 04/20/2017 9:35 AM

## 2017-04-20 NOTE — Progress Notes (Signed)
Respiratory Care Note:  Trach Management All equipment at beside for suctioning and trach care.  Ambu bag and additional trach at bedside.  No eduatiion need at this time.  Pt suctioned and inner cannula changed. Pt on trach collar.

## 2017-04-20 NOTE — Progress Notes (Signed)
Progress Note  Patient Name: [name] [name] Date of Encounter: 04/20/2017  Primary Cardiologist: Dr. Gwenlyn Found  Subjective   Pt states he feels much better today. He denies chest pain and palpitations. He wishes to discharge home.  Inpatient Medications    Scheduled Meds: . allopurinol  100 mg Oral Daily  . apixaban  5 mg Oral BID  . arformoterol  15 mcg Nebulization BID  . bisacodyl  10 mg Oral Once  . budesonide  0.5 mg Nebulization BID  . chlorhexidine  15 mL Mouth Rinse BID  . dextromethorphan-guaiFENesin  1 tablet Oral BID  . diltiazem  240 mg Oral Daily  . mouth rinse  15 mL Mouth Rinse q12n4p  . polyethylene glycol  17 g Oral Daily   Continuous Infusions: . sodium chloride    . cefTRIAXone (ROCEPHIN)  IV Stopped (04/19/17 2059)  . diltiazem (CARDIZEM) infusion     PRN Meds: acetaminophen, albuterol, colchicine, nitroGLYCERIN, ondansetron (ZOFRAN) IV, zolpidem   Vital Signs    Vitals:   04/20/17 0530 04/20/17 0805 04/20/17 0831 04/20/17 0911  BP:  121/60 102/68   Pulse: 88 89 68 90  Resp: 20   (!) 22  Temp:  (!) 97.3 F (36.3 C) 97.7 F (36.5 C)   TempSrc:  Oral Oral   SpO2: 94% 91% 95% 99%  Weight:      Height:        Intake/Output Summary (Last 24 hours) at 04/20/17 0931 Last data filed at 04/20/17 0843  Gross per 24 hour  Intake          1605.08 ml  Output             1785 ml  Net          -179.92 ml   Filed Weights   04/19/17 0100 04/20/17 0522  Weight: 230 lb 6.4 oz (104.5 kg) 232 lb 8 oz (105.5 kg)     Physical Exam   General: Well developed, well nourished, male appearing in no acute distress. Head: Normocephalic, atraumatic.  Neck: Supple without bruits, no JVD Lungs:  Resp regular and unlabored, CTA. Heart: Regular rate, regular rhythm (3:1 flutter on monitor), no murmur; no rub. Abdomen: Soft, non-tender, non-distended with normoactive bowel sounds. No hepatomegaly. No rebound/guarding. No obvious abdominal masses. Extremities:  No clubbing, cyanosis, 2+ edema. Distal pedal pulses are faint bilaterally. Neuro: Alert and oriented X 3. Moves all extremities spontaneously. Psych: Normal affect.  Labs    Chemistry Recent Labs Lab 04/18/17 1628 04/18/17 1814 04/19/17 0403 04/20/17 0332  NA 138  --  138 135  K 4.0  --  3.7 3.7  CL 81*  --  86* 83*  CO2 45*  --  38* 42*  GLUCOSE 81  --  107* 118*  BUN 78*  --  74* 70*  CREATININE 2.36*  --  2.22* 2.31*  CALCIUM 9.0  --  8.8* 8.8*  PROT  --  7.3  --   --   ALBUMIN  --  3.6  --   --   AST  --  24  --   --   ALT  --  9*  --   --   ALKPHOS  --  71  --   --   BILITOT  --  1.0  --   --   GFRNONAA 27*  --  29* 28*  GFRAA 32*  --  34* 32*  ANIONGAP 12  --  14 10  Hematology Recent Labs Lab 04/18/17 1628 04/19/17 0233 04/19/17 0403 04/20/17 0332  WBC 4.5  --  4.4 5.0  RBC 2.93* 2.82* 2.72* 3.03*  HGB 7.5*  --  7.2* 8.0*  HCT 27.9*  --  25.4* 28.2*  MCV 95.2  --  93.4 93.1  MCH 25.6*  --  26.5 26.4  MCHC 26.9*  --  28.3* 28.4*  RDW 14.5  --  15.2 15.3  PLT 138*  --  111* 132*    Cardiac Enzymes Recent Labs Lab 04/19/17 0233 04/19/17 0403 04/19/17 1046  TROPONINI 0.04* 0.04* 0.03*    Recent Labs Lab 04/18/17 1639  TROPIPOC 0.02     BNP Recent Labs Lab 04/19/17 0233  BNP 513.1*     DDimer No results for input(s): DDIMER in the last 168 hours.   Radiology    Dg Chest 2 View  Result Date: 04/18/2017 CLINICAL DATA:  Tachycardia and hypotension. EXAM: CHEST  2 VIEW COMPARISON:  February 26, 2017 ; November 17, 2014 FINDINGS: Tracheostomy catheter tip is 6.4 cm above the carina. No pneumothorax. 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 There are bilateral pleural effusions. There is moderate interstitial edema bilaterally. There is no airspace consolidation. There is cardiomegaly with pulmonary venous hypertension. There is aortic atherosclerosis. Patient is status post coronary artery bypass  grafting. No evident bone lesions. IMPRESSION: Tracheostomy as described without evident pneumothorax. Evidence of congestive heart failure without airspace consolidation. There is aortic atherosclerosis. 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 Aortic Atherosclerosis (ICD10-I70.0). Electronically Signed   By: Lowella Grip III M.D.   On: 04/18/2017 17:09     Telemetry    Fib/flutter 80-120s - Personally Reviewed  ECG    No new tracings - Personally Reviewed   Cardiac Studies   Echocardiogram 04/19/17: Study Conclusions - Left ventricle: Wall thickness was increased in a pattern of   moderate LVH. Systolic function was normal. The estimated   ejection fraction was in the range of 55% to 60%. The study is   not technically sufficient to allow evaluation of LV diastolic   function. - Aortic valve: AV is thckened with mildly restricted motion. Peak   and mean gradients through the valve are 32 and 14 mm Hg   respectively. - Left atrium: The atrium was mildly dilated. - Right ventricle: The cavity size was mildly dilated. Wall   thickness was normal. - Right atrium: The atrium was mildly dilated. - Pulmonary arteries: PA peak pressure: 42 mm Hg (S).   Echocardiogram 05/21/13: Study Conclusions - Left ventricle: The cavity size was mildly dilated. Wall thickness was increased in a pattern of mild LVH. Systolic function was normal. The estimated ejection fraction was in the range of 55% to 60%. Wall motion was normal; there were no regional wall motion abnormalities. Doppler parameters are consistent with abnormal left ventricular relaxation (grade 1 diastolic dysfunction). - Ventricular septum: Septal motion showed abnormal function and dyssynergy. - Aortic valve: Trileaflet; mildly thickened, moderately calcified leaflets. There was mild stenosis. Trivial regurgitation. Valve area: 1.53cm^2(VTI). Valve area: 1.67cm^2  (Vmax). - Mitral valve: Mildly calcified annulus. - Left atrium: The atrium was moderately dilated. - Atrial septum: No defect or patent foramen ovale was identified.   Nuc med study 09/17/12: Low risk study, some diaphragmatic attenuation   Patient Profile     66 y.o. male with a hx of COPD on home O2 with trach in place,  chronic diastolic heart failure, CKD stage III, HTN, HLD, DM, GERD, CAD s/p CABG and subsequent stent placements who is being seen today for the evaluation of Aflutter.  Assessment & Plan    1. Elevated troponin, hx of CAD s/p CABG x 6 (2001), PCI with stent to the OM branch (07/2004), PDA vein graft stent 7/10 with an OM circumflex DS 8/10 and a RCA vein graft stent 7/11, nuc in 2014 low risk  - no chest pain - repeat echo unchanged without new WMA   2. Atrial flutter - rate controlled on IV cardizem - transitioned to PO cardizem and will monitor rate - started eliquis 5 mg BID for anticoagulation - he remains hemodynamically stable with no idnication for emergent DCCV - will continue to monitor on PO cardizem - plan follow up in clinic after 3 weeks of AC for possible DCCV  If he remains in rate-controlled with PO cardizem, consider discharge home on eliquis. Discussed the improtance of not missing a dose of eliquis. Will setup to come back to clinic in 3-4 weeks to discuss possible DCCV.   3. Anemia - 1U PRBC to transfuse - Hb now 8.0 (7.2) - no signs of bleeding   4. Chronic diastolic heart failure - weight is up to 232 (230) and he is overall net positive 700cc - IVF discontinued - restarted morning dose of 80 mg lasix daily   5. CDK stage III - sCr 2.31 (2.22) - may improve with diuresis and transfusion - continue daily BMETs   6. DM - well-controlled   7. COPD - trach in place on home regimen, at baseline   8. Hx of TIA - no residual deficits   Signed, Ledora Bottcher , PA-C 9:31 AM 04/20/2017 Pager:  [phone]  Personally seen and examined. Agree with above.  Feels well, no CP, no SOB Gen alert, Irreg irreg, CTAB, no edema  Atrial flutter  - OK with DC   - will see back in clinic.   - Cardioversion in 3 weeks. Do not miss dose of Eliquis   Signing off.   Candee Furbish, MD

## 2017-04-20 NOTE — Discharge Instructions (Signed)

## 2017-04-20 NOTE — Progress Notes (Addendum)
[name] [name] [name] [name]  PROGRESS NOTE [facility]   [name] [name]   PPI:[id number] DOB: [birth year]  DOA: 04/18/2017 PCP: Nolene Ebbs, MD   Brief Narrative:  [name] [name] a 66 year old male with complex medical history including chronic trach, COPD, diabetes mellitus well controlled, chronic kidney disease history of TIA, chronic anemia receiving iron infusion, chronic CAD status post CABG who presented to the emergency department complaining of palpitations and pain with urination.patient was at his renal doctor office for regular checkup where he developed the patient was found to have rapid ventricular response with heart rate up to the 140s. He was also noted to be mild shortness of breath and was sent to the emergency department for further evaluation. Upon evaluation his blood pressure was noted to be in the low 80s which improved after IV fluids. His EKG was found to have new onset atrial flutter/A. Fib with RVR for which she started on Cardizem drip. Cardiology was consulted and admitted for further evaluation.  Subjective: Patient seen and examined report his breathing is at baseline, report palpitations has been resolved. Denies chest pain, dizziness and weakness.  Assessment & Plan: Atrial flutter/A. Fib with RVR - CHADSVASc at least 5  Cardiology input appreciated Patient was initially treated with Cardizem drip, switched today to oral formulation  HR well controlled  I have discussed benefits and risk of oral anticoagulation and patient agrees with start Eliquis  Will discontinue Plavix and aspirin Cardiology recommendation appreciated   Elevated troponin Likely from demand ischemia from rapid ventricular response and anemia No further work up at this time   Sepsis secondary to UTI Sepsis physiology has resolved On Rocephin  Urine cultures grow multiple species - will treat with abx for total of 3 days  Blood cx pending   Chronic respiratory failure with hypoxia and  hypercapnia due to COPD and OSA - remains stable  Trach dependent Continue to monitor  Chronic diastolic heart failure Compensated at this time  He is on Lasix and metolazone Continue current regimen  Chronic iron deficiency anemia FOBT pending S/p 1 unit of PRBC's  Hgb stable  Will give iron IV infusion   CKD stage IV  Cr at baseline   DVT prophylaxis: SQ heparin  Code Status:  FULL  Family Communication: None at bedside  Disposition Plan: Home in 24 hrs - monitor Hbg as he was stared on a/c. If stable will d/c in AM     Consultants:   cardiology  Procedures:   Echo 04/19/2017 ------------------------------------------------------------------- Study Conclusions  - Left ventricle: Wall thickness was increased in a pattern of   moderate LVH. Systolic function was normal. The estimated   ejection fraction was in the range of 55% to 60%. The study is   not technically sufficient to allow evaluation of LV diastolic   function. - Aortic valve: AV is thckened with mildly restricted motion. Peak   and mean gradients through the valve are 32 and 14 mm Hg   respectively. - Left atrium: The atrium was mildly dilated. - Right ventricle: The cavity size was mildly dilated. Wall   thickness was normal. - Right atrium: The atrium was mildly dilated. - Pulmonary arteries: PA peak pressure: 42 mm Hg (S).  Antimicrobials: Anti-infectives    Start     Dose/Rate Route Frequency Ordered Stop   04/19/17 2200  cefTRIAXone (ROCEPHIN) 1 g in dextrose 5 % 50 mL IVPB  Status:  Discontinued     1 g 100 mL/hr over 30 Minutes  Intravenous  Once 04/18/17 2144 04/18/17 2220   04/19/17 2000  cefTRIAXone (ROCEPHIN) 1 g in dextrose 5 % 50 mL IVPB  Status:  Discontinued     1 g 100 mL/hr over 30 Minutes Intravenous  Once 04/18/17 2234 04/19/17 1420   04/19/17 2000  cefTRIAXone (ROCEPHIN) 1 g in dextrose 5 % 50 mL IVPB     1 g 100 mL/hr over 30 Minutes Intravenous Every 24 hours 04/19/17 1423  04/23/17 1959   04/19/17 1430  cefTRIAXone (ROCEPHIN) 1 g in dextrose 5 % 50 mL IVPB  Status:  Discontinued     1 g 100 mL/hr over 30 Minutes Intravenous Every 24 hours 04/19/17 1420 04/19/17 1423   04/18/17 2230  cefTRIAXone (ROCEPHIN) 1 g in dextrose 5 % 50 mL IVPB  Status:  Discontinued     1 g 100 mL/hr over 30 Minutes Intravenous  Once 04/18/17 2220 04/18/17 2234   04/18/17 1945  cefTRIAXone (ROCEPHIN) 1 g in dextrose 5 % 50 mL IVPB     1 g 100 mL/hr over 30 Minutes Intravenous  Once 04/18/17 1932 04/18/17 2036         Objective: Vitals:   04/20/17 0530 04/20/17 0805 04/20/17 0831 04/20/17 0911  BP:  121/60 102/68   Pulse: 88 89 68 90  Resp: 20   (!) 22  Temp:  (!) 97.3 F (36.3 C) 97.7 F (36.5 C)   TempSrc:  Oral Oral   SpO2: 94% 91% 95% 99%  Weight:      Height:        Intake/Output Summary (Last 24 hours) at 04/20/17 0916 Last data filed at 04/20/17 0843  Gross per 24 hour  Intake          1605.08 ml  Output             1785 ml  Net          -179.92 ml   Filed Weights   04/19/17 0100 04/20/17 0522  Weight: 104.5 kg (230 lb 6.4 oz) 105.5 kg (232 lb 8 oz)    Examination:  General: NAD, Tach in place, able to speak well  Cardiovascular: Irr Irr, T5T7 + systolic murmur  Respiratory: CTA bilaterally, no wheezing, no rhonchi Abdominal: Soft, NT, ND, bowel sounds + Extremities: no edema, no cyanosis  Data Reviewed: I have personally reviewed following labs and imaging studies  CBC:  Recent Labs Lab 04/18/17 1628 04/19/17 0403 04/20/17 0332  WBC 4.5 4.4 5.0  HGB 7.5* 7.2* 8.0*  HCT 27.9* 25.4* 28.2*  MCV 95.2 93.4 93.1  PLT 138* 111* 322*   Basic Metabolic Panel:  Recent Labs Lab 04/18/17 1628 04/19/17 0403 04/20/17 0332  NA 138 138 135  K 4.0 3.7 3.7  CL 81* 86* 83*  CO2 45* 38* 42*  GLUCOSE 81 107* 118*  BUN 78* 74* 70*  CREATININE 2.36* 2.22* 2.31*  CALCIUM 9.0 8.8* 8.8*   GFR: Estimated Creatinine Clearance: 40.6 mL/min (A) (by  C-G formula based on SCr of 2.31 mg/dL (H)). Liver Function Tests:  Recent Labs Lab 04/18/17 1814  AST 24  ALT 9*  ALKPHOS 71  BILITOT 1.0  PROT 7.3  ALBUMIN 3.6    Recent Labs Lab 04/18/17 1814  LIPASE 31   No results for input(s): AMMONIA in the last 168 hours. Coagulation Profile:  Recent Labs Lab 04/19/17 0233  INR 0.99   Cardiac Enzymes:  Recent Labs Lab 04/19/17 0233 04/19/17 0403 04/19/17 1046  TROPONINI 0.04*  0.04* 0.03*   BNP (last 3 results) No results for input(s): PROBNP in the last 8760 hours. HbA1C:  Recent Labs  04/19/17 0233  HGBA1C 5.1   CBG: No results for input(s): GLUCAP in the last 168 hours. Lipid Profile:  Recent Labs  04/19/17 0403  CHOL 159  HDL 46  LDLCALC 99  TRIG 69  CHOLHDL 3.5   Thyroid Function Tests:  Recent Labs  04/19/17 0233 04/19/17 0403  TSH 0.462  --   FREET4  --  1.32*  T3FREE 2.3  --    Anemia Panel:  Recent Labs  04/19/17 0233  VITAMINB12 929*  FOLATE 8.5  FERRITIN 558*  TIBC 277  IRON 30*  RETICCTPCT 1.0   Sepsis Labs:  Recent Labs Lab 04/18/17 1820 04/19/17 0233  PROCALCITON  --  0.12  LATICACIDVEN 1.61 0.6    Recent Results (from the past 240 hour(s))  Urine culture     Status: Abnormal   Collection Time: 04/18/17  6:30 PM  Result Value Ref Range Status   Specimen Description URINE, RANDOM  Final   Special Requests NONE  Final   Culture MULTIPLE SPECIES PRESENT, SUGGEST RECOLLECTION (A)  Final   Report Status 04/19/2017 FINAL  Final  MRSA PCR Screening     Status: None   Collection Time: 04/19/17 12:46 AM  Result Value Ref Range Status   MRSA by PCR NEGATIVE NEGATIVE Final    Comment:        The GeneXpert MRSA Assay (FDA approved for NASAL specimens only), is one component of a comprehensive MRSA colonization surveillance program. It is not intended to diagnose MRSA infection nor to guide or monitor treatment for MRSA infections.       Radiology Studies: Dg  Chest 2 View  Result Date: 04/18/2017 CLINICAL DATA:  Tachycardia and hypotension. EXAM: CHEST  2 VIEW COMPARISON:  February 26, 2017 ; November 17, 2014 FINDINGS: Tracheostomy catheter tip is 6.4 cm above the carina. No pneumothorax. 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 There are bilateral pleural effusions. There is moderate interstitial edema bilaterally. There is no airspace consolidation. There is cardiomegaly with pulmonary venous hypertension. There is aortic atherosclerosis. Patient is status post coronary artery bypass grafting. No evident bone lesions. IMPRESSION: Tracheostomy as described without evident pneumothorax. Evidence of congestive heart failure without airspace consolidation. There is aortic atherosclerosis. 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 Aortic Atherosclerosis (ICD10-I70.0). Electronically Signed   By: Lowella Grip III M.D.   On: 04/18/2017 17:09    Scheduled Meds: . allopurinol  100 mg Oral Daily  . arformoterol  15 mcg Nebulization BID  . bisacodyl  10 mg Oral Once  . budesonide  0.5 mg Nebulization BID  . chlorhexidine  15 mL Mouth Rinse BID  . dextromethorphan-guaiFENesin  1 tablet Oral BID  . heparin subcutaneous  5,000 Units Subcutaneous Q8H  . mouth rinse  15 mL Mouth Rinse q12n4p  . polyethylene glycol  17 g Oral Daily   Continuous Infusions: . sodium chloride    . cefTRIAXone (ROCEPHIN)  IV Stopped (04/19/17 2059)  . diltiazem (CARDIZEM) infusion 10 mg/hr (04/20/17 0116)     LOS: 2 days    Time spent: Total of 15 minutes spent with pt, greater than 50% of which was spent in discussion of  treatment, counseling and coordination of care    Chipper Oman, MD Pager: Text Page via [email or web address]   If  7PM-7AM, please contact night-coverage [email or web address] 04/20/2017, 9:16 AM

## 2017-04-21 DIAGNOSIS — Z930 Tracheostomy status: Secondary | ICD-10-CM

## 2017-04-21 MED ORDER — OFF THE BEAT BOOK
Freq: Once | Status: AC
  Administered 2017-04-21: 09:00:00
  Filled 2017-04-21 (×2): qty 1

## 2017-04-21 MED ORDER — APIXABAN 5 MG PO TABS: 5.0000 mg | ORAL_TABLET | Freq: Two times a day (BID) | ORAL | 0 refills | Status: AC

## 2017-04-21 MED ORDER — DILTIAZEM HCL ER COATED BEADS 240 MG PO CP24: 240.0000 mg | ORAL_CAPSULE | Freq: Every day | ORAL | 0 refills | Status: AC

## 2017-04-21 NOTE — Discharge Summary (Signed)
Physician Discharge Summary  [name] [name]  AYT:[id number]  DOB: 1951-05-31  DOA: 04/18/2017 PCP: Nolene Ebbs, MD  Admit date: 04/18/2017 Discharge date: 04/21/2017  Admitted From: Home  Disposition:  Home   Recommendations for Outpatient Follow-up:  1. Follow up with PCP in 1 weeks 2. Please obtain BMP/CBC in one week to monitor Hgb abd renal function  3. Follow up with cardiology in 1-2 weeks  4. Follow up final blood cultures, neg thus far   Discharge Condition: Stable   CODE STATUS: FULL  Diet recommendation: Heart Healthy   Brief/Interim Summary: [name] [name] a 66 year old male with complex medical history including chronic trach, COPD, diabetes mellitus well controlled, chronic kidney disease history of TIA, chronic anemia receiving iron infusion, chronic CAD status post CABG who presented to the emergency department complaining of palpitations and pain with urination.patient was at his renal doctor office for regular checkup where he developed the patient was found to have rapid ventricular response with heart rate up to the 140s. He was also noted to be mild shortness of breath and was sent to the emergency department for further evaluation. Upon evaluation his blood pressure was noted to be in the low 80s which improved after IV fluids. His EKG was found to have new onset atrial flutter/A. Fib with RVR for which she started on Cardizem drip. Cardiology was consulted and admitted for further evaluation.  Subjective: Patient seen and examined doing well, has no complaints this am. Patient denies chest pain, palpitations and weakness. Breathing at baseline. No acute events overnight. Remains afebrile   Discharge Diagnoses/Hospital Course:  Atrial flutter/A. Fib with RVR - CHADSVASc at least 5  Cardiology input appreciated Patient was initially treated with Cardizem drip, switched today to oral formulation  HR well controlled with Cardizem 240 mg daily  I have discussed  benefits and risk of oral anticoagulation and patient agrees with start Eliquis  Continue Eliquis 5mg  BID - side effects discussed with patient  Plavix and aspirin were d/ced  Follow up with cardiology in 1-2 weeks   Elevated troponin Likely from demand ischemia from rapid ventricular response and anemia No further work up at this time   Sepsis secondary to UTI Sepsis physiology has resolved Completed course of abx with Ceftriaxone for 3 days  Urine cultures grow multiple species  Blood cx no growth thus far   Chronic respiratory failure with hypoxia and hypercapnia due to COPD and OSA   Trach dependent Was stable during hospital stay  Yarnell care was performed   Chronic diastolic heart failure Compensated at this time  He is on Lasix and metolazone which were continue w/o changes   Chronic iron deficiency anemia S/p 1 unit of PRBC's  IV iron x 1 given  Follow up with PCP to monitor H/H   CKD stage IV  Cr at baseline   All other chronic medical condition were stable during the hospitalization.  On the day of the discharge the patient's vitals were stable, and no other acute medical condition were reported by patient. Patient was felt safe to be discharge to home   Discharge Instructions  You were cared for by a hospitalist during your hospital stay. If you have any questions about your discharge medications or the care you received while you were in the hospital after you are discharged, you can call the unit and asked to speak with the hospitalist on call if the hospitalist that took care of you is not available. Once you  are discharged, your primary care physician will handle any further medical issues. Please note that NO REFILLS for any discharge medications will be authorized once you are discharged, as it is imperative that you return to your primary care physician (or establish a relationship with a primary care physician if you do not have one) for your aftercare  needs so that they can reassess your need for medications and monitor your lab values.  Discharge Instructions    Call MD for:  difficulty breathing, headache or visual disturbances    Complete by:  As directed    Call MD for:  extreme fatigue    Complete by:  As directed    Call MD for:  hives    Complete by:  As directed    Call MD for:  persistant dizziness or light-headedness    Complete by:  As directed    Call MD for:  persistant nausea and vomiting    Complete by:  As directed    Call MD for:  redness, tenderness, or signs of infection (pain, swelling, redness, odor or green/yellow discharge around incision site)    Complete by:  As directed    Call MD for:  severe uncontrolled pain    Complete by:  As directed    Call MD for:  temperature >100.4    Complete by:  As directed    Diet - low sodium heart healthy    Complete by:  As directed    Increase activity slowly    Complete by:  As directed      Allergies as of 04/21/2017      Reactions   Amlodipine Besy-benazepril Hcl Swelling   LIPS SWELL ANGIOEDEMA   Percocet [oxycodone-acetaminophen] Other (See Comments)   HALLUCINATIONS      Medication List    STOP taking these medications   aspirin 81 MG tablet   clopidogrel 75 MG tablet Commonly known as:  PLAVIX     TAKE these medications   acetaminophen 500 MG tablet Commonly known as:  TYLENOL Take 500 mg by mouth every 6 (six) hours as needed for mild pain or moderate pain.   albuterol (2.5 MG/3ML) 0.083% nebulizer solution Commonly known as:  PROVENTIL Take 3 mLs (2.5 mg total) by nebulization every 4 (four) hours as needed for wheezing or shortness of breath.   allopurinol 100 MG tablet Commonly known as:  ZYLOPRIM Take 100 mg by mouth daily.   apixaban 5 MG Tabs tablet Commonly known as:  ELIQUIS Take 1 tablet (5 mg total) by mouth 2 (two) times daily.   arformoterol 15 MCG/2ML Nebu Commonly known as:  BROVANA Take 2 mLs (15 mcg total) by  nebulization 2 (two) times daily.   budesonide 0.5 MG/2ML nebulizer solution Commonly known as:  PULMICORT USE ONE VIAL IN NEBULIZER TWICE DAILY   COLCRYS 0.6 MG tablet Generic drug:  colchicine Take 0.6 mg by mouth daily as needed (gout).   dextromethorphan-guaiFENesin 30-600 MG 12hr tablet Commonly known as:  MUCINEX DM Take 1 tablet by mouth 2 (two) times daily. Patient takes for mucus relief   diltiazem 240 MG 24 hr capsule Commonly known as:  CARDIZEM CD Take 1 capsule (240 mg total) by mouth daily.   furosemide 40 MG tablet Commonly known as:  LASIX TAKE TWO TABLETS BY MOUTH IN THE MORNING AND ONE TABLET IN THE EVENING What changed:  See the new instructions.   metolazone 2.5 MG tablet Commonly known as:  ZAROXOLYN TAKE ONE TABLET  BY MOUTH ONCE DAILY ON  MONDAY,  WEDNESDAY,  AND  FRIDAY  30  MINUTES  BEFORE  LASIX What changed:  See the new instructions.   nitroGLYCERIN 0.4 MG SL tablet Commonly known as:  NITROSTAT Place 1 tablet (0.4 mg total) under the tongue every 5 (five) minutes as needed. For chest pain            Discharge Care Instructions        Start     Ordered   04/21/17 0000  apixaban (ELIQUIS) 5 MG TABS tablet  2 times daily     04/21/17 0919   04/21/17 0000  diltiazem (CARDIZEM CD) 240 MG 24 hr capsule  Daily     04/21/17 0919   04/21/17 0000  Increase activity slowly     04/21/17 0919   04/21/17 0000  Diet - low sodium heart healthy     04/21/17 0919   04/21/17 0000  Call MD for:  temperature >100.4     04/21/17 0919   04/21/17 0000  Call MD for:  persistant nausea and vomiting     04/21/17 0919   04/21/17 0000  Call MD for:  severe uncontrolled pain     04/21/17 0919   04/21/17 0000  Call MD for:  redness, tenderness, or signs of infection (pain, swelling, redness, odor or green/yellow discharge around incision site)     04/21/17 0919   04/21/17 0000  Call MD for:  difficulty breathing, headache or visual disturbances     04/21/17  0919   04/21/17 0000  Call MD for:  hives     04/21/17 0919   04/21/17 0000  Call MD for:  persistant dizziness or light-headedness     04/21/17 0919   04/21/17 0000  Call MD for:  extreme fatigue     04/21/17 0919     Follow-up Information    Lorretta Harp, MD. Schedule an appointment as soon as possible for a visit in 2 day(s).   Specialties:  Cardiology, Radiology Why:  Hospital follow up - new onset A flutter  Contact information: [address] [place] [address] [phone]        Nolene Ebbs, MD. Schedule an appointment as soon as possible for a visit in 1 week(s).   Specialty:  Internal Medicine Why:  Hospital follow up  Contact information: [address] [place] [address] [phone]          Allergies  Allergen Reactions  . Amlodipine Besy-Benazepril Hcl Swelling    LIPS SWELL ANGIOEDEMA  . Percocet [Oxycodone-Acetaminophen] Other (See Comments)    HALLUCINATIONS    Consultations:  Cardiology    Procedures/Studies: Dg Chest 2 View  Result Date: 04/18/2017 CLINICAL DATA:  Tachycardia and hypotension. EXAM: CHEST  2 VIEW COMPARISON:  February 26, 2017 ; November 17, 2014 FINDINGS: Tracheostomy catheter tip is 6.4 cm above the carina. No pneumothorax. 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 There are bilateral pleural effusions. There is moderate interstitial edema bilaterally. There is no airspace consolidation. There is cardiomegaly with pulmonary venous hypertension. There is aortic atherosclerosis. Patient is status post coronary artery bypass grafting. No evident bone lesions. IMPRESSION: Tracheostomy as described without evident pneumothorax. Evidence of congestive heart failure without airspace consolidation. There is aortic atherosclerosis. 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 Aortic Atherosclerosis (ICD10-I70.0). Electronically Signed   By:  Lowella Grip III M.D.  On: 04/18/2017 17:09   ECHO 9/6 ------------------------------------------------------------------- Study Conclusions  - Left ventricle: Wall thickness was increased in a pattern of   moderate LVH. Systolic function was normal. The estimated   ejection fraction was in the range of 55% to 60%. The study is   not technically sufficient to allow evaluation of LV diastolic   function. - Aortic valve: AV is thckened with mildly restricted motion. Peak   and mean gradients through the valve are 32 and 14 mm Hg   respectively. - Left atrium: The atrium was mildly dilated. - Right ventricle: The cavity size was mildly dilated. Wall   thickness was normal. - Right atrium: The atrium was mildly dilated. - Pulmonary arteries: PA peak pressure: 42 mm Hg (S).   Discharge Exam: Vitals:   04/21/17 0752 04/21/17 0810  BP:  90/74  Pulse: 87 89  Resp: 20 (!) 23  Temp:  97.6 F (36.4 C)  SpO2: 100% 100%   Vitals:   04/21/17 0553 04/21/17 0751 04/21/17 0752 04/21/17 0810  BP: 110/77   90/74  Pulse: 87  87 89  Resp: (!) 23  20 (!) 23  Temp: (!) 97.5 F (36.4 C)   97.6 F (36.4 C)  TempSrc: Oral   Oral  SpO2: 100% 100% 100% 100%  Weight: 106.2 kg (234 lb 3.2 oz)     Height:        General: Pt is alert, awake, not in acute distress Cardiovascular: Irr Irr, Q7/Y1 + systolic murmur, no rubs, no gallops Respiratory: Trach in place and clean. CTA bilaterally, no wheezing, no rhonchi Abdominal: Soft, NT, ND, bowel sounds + Extremities: no edema   The results of significant diagnostics from this hospitalization (including imaging, microbiology, ancillary and laboratory) are listed below for reference.     Microbiology: Recent Results (from the past 240 hour(s))  Urine culture     Status: Abnormal   Collection Time: 04/18/17  6:30 PM  Result Value Ref Range Status   Specimen Description URINE, RANDOM  Final   Special Requests NONE  Final   Culture MULTIPLE  SPECIES PRESENT, SUGGEST RECOLLECTION (A)  Final   Report Status 04/19/2017 FINAL  Final  MRSA PCR Screening     Status: None   Collection Time: 04/19/17 12:46 AM  Result Value Ref Range Status   MRSA by PCR NEGATIVE NEGATIVE Final    Comment:        The GeneXpert MRSA Assay (FDA approved for NASAL specimens only), is one component of a comprehensive MRSA colonization surveillance program. It is not intended to diagnose MRSA infection nor to guide or monitor treatment for MRSA infections.   Culture, blood (Routine X 2) w Reflex to ID Panel     Status: None (Preliminary result)   Collection Time: 04/19/17  2:35 AM  Result Value Ref Range Status   Specimen Description BLOOD RIGHT ANTECUBITAL  Final   Special Requests   Final    BOTTLES DRAWN AEROBIC AND ANAEROBIC Blood Culture results may not be optimal due to an excessive volume of blood received in culture bottles   Culture NO GROWTH 1 DAY  Final   Report Status PENDING  Incomplete  Culture, blood (Routine X 2) w Reflex to ID Panel     Status: None (Preliminary result)   Collection Time: 04/19/17  2:42 AM  Result Value Ref Range Status   Specimen Description BLOOD RIGHT HAND  Final   Special Requests   Final    BOTTLES  DRAWN AEROBIC ONLY Blood Culture adequate volume   Culture NO GROWTH 1 DAY  Final   Report Status PENDING  Incomplete     Labs: BNP (last 3 results)  Recent Labs  04/19/17 0233  BNP 371.6*   Basic Metabolic Panel:  Recent Labs Lab 04/18/17 1628 04/19/17 0403 04/20/17 0332  NA 138 138 135  K 4.0 3.7 3.7  CL 81* 86* 83*  CO2 45* 38* 42*  GLUCOSE 81 107* 118*  BUN 78* 74* 70*  CREATININE 2.36* 2.22* 2.31*  CALCIUM 9.0 8.8* 8.8*   Liver Function Tests:  Recent Labs Lab 04/18/17 1814  AST 24  ALT 9*  ALKPHOS 71  BILITOT 1.0  PROT 7.3  ALBUMIN 3.6    Recent Labs Lab 04/18/17 1814  LIPASE 31   No results for input(s): AMMONIA in the last 168 hours. CBC:  Recent Labs Lab  04/18/17 1628 04/19/17 0403 04/20/17 0332  WBC 4.5 4.4 5.0  HGB 7.5* 7.2* 8.0*  HCT 27.9* 25.4* 28.2*  MCV 95.2 93.4 93.1  PLT 138* 111* 132*   Cardiac Enzymes:  Recent Labs Lab 04/19/17 0233 04/19/17 0403 04/19/17 1046  TROPONINI 0.04* 0.04* 0.03*   BNP: Invalid input(s): POCBNP CBG:  Recent Labs Lab 04/20/17 1706  GLUCAP 124*   D-Dimer No results for input(s): DDIMER in the last 72 hours. Hgb A1c  Recent Labs  04/19/17 0233  HGBA1C 5.1   Lipid Profile  Recent Labs  04/19/17 0403  CHOL 159  HDL 46  LDLCALC 99  TRIG 69  CHOLHDL 3.5   Thyroid function studies  Recent Labs  04/19/17 0233  TSH 0.462  T3FREE 2.3   Anemia work up  Recent Labs  04/19/17 0233  VITAMINB12 929*  FOLATE 8.5  FERRITIN 558*  TIBC 277  IRON 30*  RETICCTPCT 1.0   Urinalysis    Component Value Date/Time   COLORURINE YELLOW 04/18/2017 1830   APPEARANCEUR HAZY (A) 04/18/2017 1830   LABSPEC 1.011 04/18/2017 1830   PHURINE 7.0 04/18/2017 1830   GLUCOSEU NEGATIVE 04/18/2017 1830   HGBUR LARGE (A) 04/18/2017 1830   BILIRUBINUR NEGATIVE 04/18/2017 1830   KETONESUR NEGATIVE 04/18/2017 1830   PROTEINUR 100 (A) 04/18/2017 1830   UROBILINOGEN 0.2 06/19/2014 1532   NITRITE NEGATIVE 04/18/2017 1830   LEUKOCYTESUR LARGE (A) 04/18/2017 1830   Sepsis Labs Invalid input(s): PROCALCITONIN,  WBC,  LACTICIDVEN Microbiology Recent Results (from the past 240 hour(s))  Urine culture     Status: Abnormal   Collection Time: 04/18/17  6:30 PM  Result Value Ref Range Status   Specimen Description URINE, RANDOM  Final   Special Requests NONE  Final   Culture MULTIPLE SPECIES PRESENT, SUGGEST RECOLLECTION (A)  Final   Report Status 04/19/2017 FINAL  Final  MRSA PCR Screening     Status: None   Collection Time: 04/19/17 12:46 AM  Result Value Ref Range Status   MRSA by PCR NEGATIVE NEGATIVE Final    Comment:        The GeneXpert MRSA Assay (FDA approved for NASAL  specimens only), is one component of a comprehensive MRSA colonization surveillance program. It is not intended to diagnose MRSA infection nor to guide or monitor treatment for MRSA infections.   Culture, blood (Routine X 2) w Reflex to ID Panel     Status: None (Preliminary result)   Collection Time: 04/19/17  2:35 AM  Result Value Ref Range Status   Specimen Description BLOOD RIGHT ANTECUBITAL  Final  Special Requests   Final    BOTTLES DRAWN AEROBIC AND ANAEROBIC Blood Culture results may not be optimal due to an excessive volume of blood received in culture bottles   Culture NO GROWTH 1 DAY  Final   Report Status PENDING  Incomplete  Culture, blood (Routine X 2) w Reflex to ID Panel     Status: None (Preliminary result)   Collection Time: 04/19/17  2:42 AM  Result Value Ref Range Status   Specimen Description BLOOD RIGHT HAND  Final   Special Requests   Final    BOTTLES DRAWN AEROBIC ONLY Blood Culture adequate volume   Culture NO GROWTH 1 DAY  Final   Report Status PENDING  Incomplete     Time coordinating discharge: 35 minutes  SIGNED:  Chipper Oman, MD  [facility] 04/21/2017, 9:19 AM  Pager please text page via  [email or web address] Password [email or web address]

## 2017-04-21 NOTE — Progress Notes (Signed)
Pt d/c home in no acute distress.  All d/c paperwork reviewed in detail and wife was at bedside.  2 RX given to pt/wife who will fill them at their pharmacy.  All belongings sent home with pt.  He manages his trach and wife has portable O2 tank at bedside.  Pt understands all d/c instructions and when he needs to f/u with MD.

## 2017-04-24 LAB — CULTURE, BLOOD (ROUTINE X 2)
CULTURE: NO GROWTH
Culture: NO GROWTH
SPECIAL REQUESTS: ADEQUATE

## 2017-04-25 ENCOUNTER — Ambulatory Visit (INDEPENDENT_AMBULATORY_CARE_PROVIDER_SITE_OTHER): Payer: Medicare Other | Admitting: Cardiovascular Disease

## 2017-04-25 ENCOUNTER — Encounter: Payer: Self-pay | Admitting: Cardiovascular Disease

## 2017-04-25 VITALS — BP 116/74 | HR 96 | Ht 73.0 in | Wt 237.0 lb

## 2017-04-25 DIAGNOSIS — G4733 Obstructive sleep apnea (adult) (pediatric): Secondary | ICD-10-CM

## 2017-04-25 DIAGNOSIS — I110 Hypertensive heart disease with heart failure: Secondary | ICD-10-CM

## 2017-04-25 DIAGNOSIS — I5032 Chronic diastolic (congestive) heart failure: Secondary | ICD-10-CM | POA: Diagnosis not present

## 2017-04-25 DIAGNOSIS — I2510 Atherosclerotic heart disease of native coronary artery without angina pectoris: Secondary | ICD-10-CM | POA: Diagnosis not present

## 2017-04-25 DIAGNOSIS — I483 Typical atrial flutter: Secondary | ICD-10-CM

## 2017-04-25 DIAGNOSIS — I739 Peripheral vascular disease, unspecified: Secondary | ICD-10-CM | POA: Diagnosis not present

## 2017-04-25 NOTE — Assessment & Plan Note (Signed)
[name] [name] was recently hospitalized earlier this month with rapid heart rate, hypotension and new onset atrial flutter with RVR. He was transitioned from IV to oral diltiazem. He was placed on Eliquis is because of a high CHAdsVASC2 score of 6. It was thought that this was provoked by UTI/urosepsis. He is referred today with a ventricular response of 87. I'm going to bring him back in one month at which time we will discuss outpatient DC cardioversion.

## 2017-04-25 NOTE — Assessment & Plan Note (Signed)
History of CAD status post bypass grafting 6 in 2001. He had an obtuse marginal branch stent foci show 5 as well as a PDA vein graft stent 7/10 and obtuse marginal branch drug-eluting stent 8/10 and an RCA vein graft stent 7/11. Myoview performed February 2015 was nonischemic. He denies chest pain or increasing shortness of breath.

## 2017-04-25 NOTE — Assessment & Plan Note (Signed)
History of diastolic heart failure with recent echo performed 04/19/17 revealing normal LV systolic function with moderate LVH. He's had grade 1 diastolic dysfunction the past. He is on high-dose diuretics with chronic lower extremity edema.

## 2017-04-25 NOTE — Assessment & Plan Note (Signed)
History of essential hypertension blood pressure measured 116/74. He is on diltiazem. Continue current meds at current dosing

## 2017-04-25 NOTE — Patient Instructions (Signed)
Medication Instructions: Your physician recommends that you continue on your current medications as directed. Please refer to the Current Medication list given to you today.   Follow-Up: We request that you follow-up in: 1 month with Dr Gwenlyn Found.  If you need a refill on your cardiac medications before your next appointment, please call your pharmacy.

## 2017-04-25 NOTE — Progress Notes (Signed)
04/25/2017 DEVAL MROCZKA   Feb 16, 1951  [id number]  Primary Physician Nolene Ebbs, MD Primary Cardiologist: Lorretta Harp MD Lupe Carney, [place]  HPI:  [name] [name] is a 66 y.o. male obese African American male with history of coronary artery disease, diastolic dysfunction, COPD who is O2 dependent, peripheral vascular disease, chronic renal insufficiency stage 3, diabetes mellitus, hypertension and obstructive sleep apnea. I last saw him in the office 12/08/16.The patient was hospitalized earlier in the year with acute respiratory failure and hypoxia, likely related to volume overload from diastolic dyslipidemia. He was diuresed with IV Lasix, metolazone. He was also seen from April 3rd to April 7th, apparently with a COPD exacerbation. The patient has a stent in the left SFA which was put in in September of 2009 for claudication. He also does have a short-segment occlusion of the right mid SFA with tibioperoneal disease as well. He had coronary artery bypass grafting in 2001 with PCI stenting of his OM branch in December of 2005. Last cardiac catheterization was in July of 2011 revealing a patent LIMA to the LAD, 80% stenosis beyond that, and a patent vein to the large venous branch, a total vein to the PDA which was restented using a Taxus ion drug-eluting stent with a patent circumflex to the obtuse marginal branch and normal LV function.   His last nuclear stress test was February 2015. With ejection fraction was 62%; it was a low risk study with normal nuclear imaging. No significant change from previous study. This study was done for ST changes to his EKG which are still present and look similar to previous EKG with a right bundle branch block and slightly tachycardic at 104.   Since I saw him 5 months ago he was admitted earlier this month with urosepsis, a flutter with rapid ventricular response and hypotension. His heart rate was controlled with IV diltiazem which  position to oral preparation. His troponins were minimally elevated probably from demand ischemia and his echo showed preserved LV function. He was placed on Eliquis  oral anticoagulation which he remains on.   Current Meds  Medication Sig  . acetaminophen (TYLENOL) 500 MG tablet Take 500 mg by mouth every 6 (six) hours as needed for mild pain or moderate pain.  Marland Kitchen albuterol (PROVENTIL) (2.5 MG/3ML) 0.083% nebulizer solution Take 3 mLs (2.5 mg total) by nebulization every 4 (four) hours as needed for wheezing or shortness of breath.  . allopurinol (ZYLOPRIM) 100 MG tablet Take 100 mg by mouth daily.   Marland Kitchen apixaban (ELIQUIS) 5 MG TABS tablet Take 1 tablet (5 mg total) by mouth 2 (two) times daily.  Marland Kitchen arformoterol (BROVANA) 15 MCG/2ML NEBU Take 2 mLs (15 mcg total) by nebulization 2 (two) times daily.  . budesonide (PULMICORT) 0.5 MG/2ML nebulizer solution USE ONE VIAL IN NEBULIZER TWICE DAILY  . COLCRYS 0.6 MG tablet Take 0.6 mg by mouth daily as needed (gout).   Marland Kitchen dextromethorphan-guaiFENesin (MUCINEX DM) 30-600 MG 12hr tablet Take 1 tablet by mouth 2 (two) times daily. Patient takes for mucus relief  . diltiazem (CARDIZEM CD) 240 MG 24 hr capsule Take 1 capsule (240 mg total) by mouth daily.  . furosemide (LASIX) 40 MG tablet TAKE TWO TABLETS BY MOUTH IN THE MORNING AND ONE TABLET IN THE EVENING (Patient taking differently: TAKE TWO TABLETS (80mg ) BY MOUTH IN THE MORNING AND ONE TABLET (40mg ) IN THE EVENING)  . metolazone (ZAROXOLYN) 2.5 MG tablet TAKE ONE TABLET BY MOUTH  ONCE DAILY ON  MONDAY,  WEDNESDAY,  AND  FRIDAY  30  MINUTES  BEFORE  LASIX (Patient taking differently: TAKE ONE TABLET (2.5mg ) BY MOUTH ONCE DAILY ON  MONDAY,  WEDNESDAY,  AND  FRIDAY  [place])  . nitroGLYCERIN (NITROSTAT) 0.4 MG SL tablet Place 1 tablet (0.4 mg total) under the tongue every 5 (five) minutes as needed. For chest pain     Allergies  Allergen Reactions  . Amlodipine Besy-Benazepril Hcl Swelling     LIPS SWELL ANGIOEDEMA  . Percocet [Oxycodone-Acetaminophen] Other (See Comments)    HALLUCINATIONS    Social History   Social History  . Marital status: Married    Spouse name: N/A  . Number of children: N/A  . Years of education: N/A   Occupational History  . Not on file.   Social History Main Topics  . Smoking status: Former Smoker    Packs/day: 1.00    Years: 25.00    Types: Cigarettes    Quit date: 08/14/1998  . Smokeless tobacco: Never Used  . Alcohol use No  . Drug use: No  . Sexual activity: Yes   Other Topics Concern  . Not on file   Social History Narrative  . No narrative on file     Review of Systems: General: negative for chills, fever, night sweats or weight changes.  Cardiovascular: negative for chest pain, dyspnea on exertion, edema, orthopnea, palpitations, paroxysmal nocturnal dyspnea or shortness of breath Dermatological: negative for rash Respiratory: negative for cough or wheezing Urologic: negative for hematuria Abdominal: negative for nausea, vomiting, diarrhea, bright red blood per rectum, melena, or hematemesis Neurologic: negative for visual changes, syncope, or dizziness All other systems reviewed and are otherwise negative except as noted above.    Blood pressure 116/74, pulse 96, height 6\' 1"  (1.854 m), weight 237 lb (107.5 kg).  General appearance: alert and no distress Neck: no adenopathy, no carotid bruit, no JVD, supple, symmetrical, trachea midline and thyroid not enlarged, symmetric, no tenderness/mass/nodules Lungs: Diffuse rhonchi bilaterally Heart: irregularly irregular rhythm Extremities: 1-2+ pitting edema bilaterally  EKG atrial flutter with ventricular response of 87, and right bundle branch block. I personally reviewed this EKG.  ASSESSMENT AND PLAN:   Chronic diastolic CHF (congestive heart failure) (HCC) History of diastolic heart failure with recent echo performed 04/19/17 revealing normal LV systolic function  with moderate LVH. He's had grade 1 diastolic dysfunction the past. He is on high-dose diuretics with chronic lower extremity edema.  Obstructive sleep apnea History of sleep apnea status post tracheostomy on BiPAP  as well as chronic O2.  PVD (peripheral vascular disease) ([place]) History of peripheral artery disease status post left SFA PTA September 2009 with a known occluded right SFA. He does not ambulate enough now to previous claudication.  Hypertensive heart disease History of essential hypertension blood pressure measured 116/74. He is on diltiazem. Continue current meds at current dosing  CAD (coronary artery disease) History of CAD status post bypass grafting 6 in 2001. He had an obtuse marginal branch stent foci show 5 as well as a PDA vein graft stent 7/10 and obtuse marginal branch drug-eluting stent 8/10 and an RCA vein graft stent 7/11. Myoview performed February 2015 was nonischemic. He denies chest pain or increasing shortness of breath.  Atrial flutter [facility]) Mr. Yoshino was recently hospitalized earlier this month with rapid heart rate, hypotension and new onset atrial flutter with RVR. He was transitioned from IV to oral diltiazem.  He was placed on Eliquis is because of a high CHAdsVASC2 score of 6. It was thought that this was provoked by UTI/urosepsis. He is referred today with a ventricular response of 87. I'm going to bring him back in one month at which time we will discuss outpatient DC cardioversion.      Lorretta Harp MD FACP,FACC,FAHA, [facility] 04/25/2017 10:27 AM

## 2017-04-25 NOTE — Assessment & Plan Note (Signed)
History of peripheral artery disease status post left SFA PTA September 2009 with a known occluded right SFA. He does not ambulate enough now to previous claudication.

## 2017-04-25 NOTE — Assessment & Plan Note (Signed)
History of sleep apnea status post tracheostomy on BiPAP  as well as chronic O2.

## 2017-05-02 ENCOUNTER — Inpatient Hospital Stay (HOSPITAL_COMMUNITY): Payer: Medicare Other

## 2017-05-02 ENCOUNTER — Emergency Department (HOSPITAL_COMMUNITY): Payer: Medicare Other

## 2017-05-02 ENCOUNTER — Inpatient Hospital Stay (HOSPITAL_COMMUNITY)
Admission: EM | Admit: 2017-05-02 | Discharge: 2017-05-14 | DRG: 871 | Disposition: E | Payer: Medicare Other | Attending: Internal Medicine | Admitting: Internal Medicine

## 2017-05-02 ENCOUNTER — Ambulatory Visit: Payer: Medicare Other | Admitting: Internal Medicine

## 2017-05-02 DIAGNOSIS — J441 Chronic obstructive pulmonary disease with (acute) exacerbation: Secondary | ICD-10-CM | POA: Diagnosis present

## 2017-05-02 DIAGNOSIS — E872 Acidosis, unspecified: Secondary | ICD-10-CM | POA: Insufficient documentation

## 2017-05-02 DIAGNOSIS — I130 Hypertensive heart and chronic kidney disease with heart failure and stage 1 through stage 4 chronic kidney disease, or unspecified chronic kidney disease: Secondary | ICD-10-CM | POA: Diagnosis present

## 2017-05-02 DIAGNOSIS — A419 Sepsis, unspecified organism: Principal | ICD-10-CM | POA: Diagnosis present

## 2017-05-02 DIAGNOSIS — Z79899 Other long term (current) drug therapy: Secondary | ICD-10-CM

## 2017-05-02 DIAGNOSIS — Z7901 Long term (current) use of anticoagulants: Secondary | ICD-10-CM

## 2017-05-02 DIAGNOSIS — Z66 Do not resuscitate: Secondary | ICD-10-CM | POA: Diagnosis present

## 2017-05-02 DIAGNOSIS — I469 Cardiac arrest, cause unspecified: Secondary | ICD-10-CM | POA: Diagnosis present

## 2017-05-02 DIAGNOSIS — E785 Hyperlipidemia, unspecified: Secondary | ICD-10-CM | POA: Diagnosis present

## 2017-05-02 DIAGNOSIS — E1122 Type 2 diabetes mellitus with diabetic chronic kidney disease: Secondary | ICD-10-CM | POA: Diagnosis present

## 2017-05-02 DIAGNOSIS — J9621 Acute and chronic respiratory failure with hypoxia: Secondary | ICD-10-CM | POA: Diagnosis present

## 2017-05-02 DIAGNOSIS — R402312 Coma scale, best motor response, none, at arrival to emergency department: Secondary | ICD-10-CM | POA: Diagnosis present

## 2017-05-02 DIAGNOSIS — Z885 Allergy status to narcotic agent status: Secondary | ICD-10-CM

## 2017-05-02 DIAGNOSIS — G931 Anoxic brain damage, not elsewhere classified: Secondary | ICD-10-CM | POA: Diagnosis present

## 2017-05-02 DIAGNOSIS — Z952 Presence of prosthetic heart valve: Secondary | ICD-10-CM

## 2017-05-02 DIAGNOSIS — T82838A Hemorrhage of vascular prosthetic devices, implants and grafts, initial encounter: Secondary | ICD-10-CM | POA: Diagnosis not present

## 2017-05-02 DIAGNOSIS — G253 Myoclonus: Secondary | ICD-10-CM | POA: Diagnosis not present

## 2017-05-02 DIAGNOSIS — I70209 Unspecified atherosclerosis of native arteries of extremities, unspecified extremity: Secondary | ICD-10-CM | POA: Diagnosis present

## 2017-05-02 DIAGNOSIS — G4733 Obstructive sleep apnea (adult) (pediatric): Secondary | ICD-10-CM | POA: Diagnosis present

## 2017-05-02 DIAGNOSIS — E871 Hypo-osmolality and hyponatremia: Secondary | ICD-10-CM | POA: Diagnosis present

## 2017-05-02 DIAGNOSIS — Z955 Presence of coronary angioplasty implant and graft: Secondary | ICD-10-CM

## 2017-05-02 DIAGNOSIS — E874 Mixed disorder of acid-base balance: Secondary | ICD-10-CM | POA: Diagnosis present

## 2017-05-02 DIAGNOSIS — E1151 Type 2 diabetes mellitus with diabetic peripheral angiopathy without gangrene: Secondary | ICD-10-CM | POA: Diagnosis present

## 2017-05-02 DIAGNOSIS — G4089 Other seizures: Secondary | ICD-10-CM | POA: Diagnosis present

## 2017-05-02 DIAGNOSIS — J189 Pneumonia, unspecified organism: Secondary | ICD-10-CM | POA: Diagnosis not present

## 2017-05-02 DIAGNOSIS — Z515 Encounter for palliative care: Secondary | ICD-10-CM | POA: Diagnosis present

## 2017-05-02 DIAGNOSIS — K219 Gastro-esophageal reflux disease without esophagitis: Secondary | ICD-10-CM | POA: Diagnosis present

## 2017-05-02 DIAGNOSIS — N390 Urinary tract infection, site not specified: Secondary | ICD-10-CM | POA: Diagnosis present

## 2017-05-02 DIAGNOSIS — Z9911 Dependence on respirator [ventilator] status: Secondary | ICD-10-CM

## 2017-05-02 DIAGNOSIS — I2510 Atherosclerotic heart disease of native coronary artery without angina pectoris: Secondary | ICD-10-CM | POA: Diagnosis present

## 2017-05-02 DIAGNOSIS — I4892 Unspecified atrial flutter: Secondary | ICD-10-CM | POA: Diagnosis present

## 2017-05-02 DIAGNOSIS — D638 Anemia in other chronic diseases classified elsewhere: Secondary | ICD-10-CM | POA: Diagnosis present

## 2017-05-02 DIAGNOSIS — J9622 Acute and chronic respiratory failure with hypercapnia: Secondary | ICD-10-CM | POA: Diagnosis present

## 2017-05-02 DIAGNOSIS — Z888 Allergy status to other drugs, medicaments and biological substances status: Secondary | ICD-10-CM

## 2017-05-02 DIAGNOSIS — R402212 Coma scale, best verbal response, none, at arrival to emergency department: Secondary | ICD-10-CM | POA: Diagnosis present

## 2017-05-02 DIAGNOSIS — J9690 Respiratory failure, unspecified, unspecified whether with hypoxia or hypercapnia: Secondary | ICD-10-CM

## 2017-05-02 DIAGNOSIS — R6521 Severe sepsis with septic shock: Secondary | ICD-10-CM | POA: Diagnosis present

## 2017-05-02 DIAGNOSIS — Z951 Presence of aortocoronary bypass graft: Secondary | ICD-10-CM

## 2017-05-02 DIAGNOSIS — I279 Pulmonary heart disease, unspecified: Secondary | ICD-10-CM | POA: Diagnosis present

## 2017-05-02 DIAGNOSIS — Z4659 Encounter for fitting and adjustment of other gastrointestinal appliance and device: Secondary | ICD-10-CM

## 2017-05-02 DIAGNOSIS — Z930 Tracheostomy status: Secondary | ICD-10-CM

## 2017-05-02 DIAGNOSIS — J690 Pneumonitis due to inhalation of food and vomit: Secondary | ICD-10-CM | POA: Diagnosis present

## 2017-05-02 DIAGNOSIS — Z9849 Cataract extraction status, unspecified eye: Secondary | ICD-10-CM

## 2017-05-02 DIAGNOSIS — R402112 Coma scale, eyes open, never, at arrival to emergency department: Secondary | ICD-10-CM | POA: Diagnosis present

## 2017-05-02 DIAGNOSIS — Y95 Nosocomial condition: Secondary | ICD-10-CM | POA: Diagnosis present

## 2017-05-02 DIAGNOSIS — N170 Acute kidney failure with tubular necrosis: Secondary | ICD-10-CM | POA: Diagnosis present

## 2017-05-02 DIAGNOSIS — Z8673 Personal history of transient ischemic attack (TIA), and cerebral infarction without residual deficits: Secondary | ICD-10-CM

## 2017-05-02 DIAGNOSIS — Z833 Family history of diabetes mellitus: Secondary | ICD-10-CM

## 2017-05-02 DIAGNOSIS — J9601 Acute respiratory failure with hypoxia: Secondary | ICD-10-CM | POA: Diagnosis not present

## 2017-05-02 DIAGNOSIS — Y838 Other surgical procedures as the cause of abnormal reaction of the patient, or of later complication, without mention of misadventure at the time of the procedure: Secondary | ICD-10-CM | POA: Diagnosis not present

## 2017-05-02 DIAGNOSIS — N183 Chronic kidney disease, stage 3 (moderate): Secondary | ICD-10-CM | POA: Diagnosis present

## 2017-05-02 DIAGNOSIS — E11649 Type 2 diabetes mellitus with hypoglycemia without coma: Secondary | ICD-10-CM | POA: Diagnosis present

## 2017-05-02 DIAGNOSIS — Z87891 Personal history of nicotine dependence: Secondary | ICD-10-CM

## 2017-05-02 DIAGNOSIS — J9602 Acute respiratory failure with hypercapnia: Secondary | ICD-10-CM | POA: Diagnosis not present

## 2017-05-02 DIAGNOSIS — I482 Chronic atrial fibrillation: Secondary | ICD-10-CM | POA: Diagnosis present

## 2017-05-02 DIAGNOSIS — Z7951 Long term (current) use of inhaled steroids: Secondary | ICD-10-CM

## 2017-05-02 DIAGNOSIS — I5032 Chronic diastolic (congestive) heart failure: Secondary | ICD-10-CM | POA: Diagnosis present

## 2017-05-02 LAB — CBC
HCT: 24.6 % — ABNORMAL LOW (ref 39.0–52.0)
HEMATOCRIT: 25 % — AB (ref 39.0–52.0)
HEMOGLOBIN: 7.2 g/dL — AB (ref 13.0–17.0)
Hemoglobin: 7.6 g/dL — ABNORMAL LOW (ref 13.0–17.0)
MCH: 26.9 pg (ref 26.0–34.0)
MCH: 27.4 pg (ref 26.0–34.0)
MCHC: 29.3 g/dL — AB (ref 30.0–36.0)
MCHC: 30.4 g/dL (ref 30.0–36.0)
MCV: 91.8 fL (ref 78.0–100.0)
MCV: 90.3 fL (ref 78.0–100.0)
PLATELETS: 148 10*3/uL — AB (ref 150–400)
Platelets: 133 10*3/uL — ABNORMAL LOW (ref 150–400)
RBC: 2.68 MIL/uL — ABNORMAL LOW (ref 4.22–5.81)
RBC: 2.77 MIL/uL — AB (ref 4.22–5.81)
RDW: 15.4 % (ref 11.5–15.5)
RDW: 15.7 % — AB (ref 11.5–15.5)
WBC: 10 10*3/uL (ref 4.0–10.5)
WBC: 10.6 10*3/uL — ABNORMAL HIGH (ref 4.0–10.5)

## 2017-05-02 LAB — BASIC METABOLIC PANEL
ANION GAP: 11 (ref 5–15)
ANION GAP: 13 (ref 5–15)
ANION GAP: 14 (ref 5–15)
BUN: 80 mg/dL — AB (ref 6–20)
BUN: 80 mg/dL — ABNORMAL HIGH (ref 6–20)
BUN: 81 mg/dL — ABNORMAL HIGH (ref 6–20)
CALCIUM: 8.1 mg/dL — AB (ref 8.9–10.3)
CHLORIDE: 85 mmol/L — AB (ref 101–111)
CO2: 36 mmol/L — AB (ref 22–32)
CO2: 33 mmol/L — ABNORMAL HIGH (ref 22–32)
CO2: 31 mmol/L (ref 22–32)
CREATININE: 2.88 mg/dL — AB (ref 0.61–1.24)
Calcium: 8 mg/dL — ABNORMAL LOW (ref 8.9–10.3)
Calcium: 8.1 mg/dL — ABNORMAL LOW (ref 8.9–10.3)
Chloride: 82 mmol/L — ABNORMAL LOW (ref 101–111)
Chloride: 84 mmol/L — ABNORMAL LOW (ref 101–111)
Creatinine, Ser: 2.71 mg/dL — ABNORMAL HIGH (ref 0.61–1.24)
Creatinine, Ser: 2.83 mg/dL — ABNORMAL HIGH (ref 0.61–1.24)
GFR calc non Af Amer: 21 mL/min — ABNORMAL LOW (ref >60)
GFR calc non Af Amer: 22 mL/min — ABNORMAL LOW (ref >60)
GFR, EST AFRICAN AMERICAN: 25 mL/min — AB (ref >60)
GFR, EST AFRICAN AMERICAN: 27 mL/min — AB (ref >60)
GFR, EST AFRICAN AMERICAN: 25 mL/min — AB (ref >60)
GFR, EST NON AFRICAN AMERICAN: 23 mL/min — AB (ref >60)
GLUCOSE: 226 mg/dL — AB (ref 65–99)
GLUCOSE: 100 mg/dL — AB (ref 65–99)
Glucose, Bld: 113 mg/dL — ABNORMAL HIGH (ref 65–99)
POTASSIUM: 4.2 mmol/L (ref 3.5–5.1)
Potassium: 3.5 mmol/L (ref 3.5–5.1)
Potassium: 3.9 mmol/L (ref 3.5–5.1)
SODIUM: 129 mmol/L — AB (ref 135–145)
SODIUM: 130 mmol/L — AB (ref 135–145)
Sodium: 130 mmol/L — ABNORMAL LOW (ref 135–145)

## 2017-05-02 LAB — GLUCOSE, CAPILLARY
GLUCOSE-CAPILLARY: 186 mg/dL — AB (ref 65–99)
GLUCOSE-CAPILLARY: 115 mg/dL — AB (ref 65–99)
GLUCOSE-CAPILLARY: 119 mg/dL — AB (ref 65–99)
GLUCOSE-CAPILLARY: 137 mg/dL — AB (ref 65–99)
Glucose-Capillary: 238 mg/dL — ABNORMAL HIGH (ref 65–99)
Glucose-Capillary: 139 mg/dL — ABNORMAL HIGH (ref 65–99)
Glucose-Capillary: 137 mg/dL — ABNORMAL HIGH (ref 65–99)
Glucose-Capillary: 134 mg/dL — ABNORMAL HIGH (ref 65–99)
Glucose-Capillary: 110 mg/dL — ABNORMAL HIGH (ref 65–99)
Glucose-Capillary: 77 mg/dL (ref 65–99)

## 2017-05-02 LAB — COMPREHENSIVE METABOLIC PANEL
ALK PHOS: 80 U/L (ref 38–126)
ALT: 17 U/L (ref 17–63)
ANION GAP: 19 — AB (ref 5–15)
AST: 42 U/L — ABNORMAL HIGH (ref 15–41)
Albumin: 3.1 g/dL — ABNORMAL LOW (ref 3.5–5.0)
BUN: 75 mg/dL — ABNORMAL HIGH (ref 6–20)
CALCIUM: 8.4 mg/dL — AB (ref 8.9–10.3)
CO2: 31 mmol/L (ref 22–32)
Chloride: 79 mmol/L — ABNORMAL LOW (ref 101–111)
Creatinine, Ser: 2.99 mg/dL — ABNORMAL HIGH (ref 0.61–1.24)
GFR calc non Af Amer: 20 mL/min — ABNORMAL LOW (ref >60)
GFR, EST AFRICAN AMERICAN: 24 mL/min — AB (ref >60)
Glucose, Bld: 140 mg/dL — ABNORMAL HIGH (ref 65–99)
Potassium: 4.2 mmol/L (ref 3.5–5.1)
SODIUM: 129 mmol/L — AB (ref 135–145)
Total Bilirubin: 0.4 mg/dL (ref 0.3–1.2)
Total Protein: 6.5 g/dL (ref 6.5–8.1)

## 2017-05-02 LAB — RESPIRATORY PANEL BY PCR
ADENOVIRUS-RVPPCR: NOT DETECTED
BORDETELLA PERTUSSIS-RVPCR: NOT DETECTED
CHLAMYDOPHILA PNEUMONIAE-RVPPCR: NOT DETECTED
CORONAVIRUS 229E-RVPPCR: NOT DETECTED
Coronavirus HKU1: NOT DETECTED
Coronavirus NL63: NOT DETECTED
Coronavirus OC43: NOT DETECTED
INFLUENZA A H1-RVPPCR: NOT DETECTED
INFLUENZA A-RVPPCR: NOT DETECTED
INFLUENZA B-RVPPCR: NOT DETECTED
Influenza A H1 2009: NOT DETECTED
Influenza A H3: NOT DETECTED
Metapneumovirus: NOT DETECTED
Mycoplasma pneumoniae: NOT DETECTED
PARAINFLUENZA VIRUS 3-RVPPCR: NOT DETECTED
PARAINFLUENZA VIRUS 4-RVPPCR: NOT DETECTED
Parainfluenza Virus 1: NOT DETECTED
Parainfluenza Virus 2: NOT DETECTED
RESPIRATORY SYNCYTIAL VIRUS-RVPPCR: NOT DETECTED
RHINOVIRUS / ENTEROVIRUS - RVPPCR: NOT DETECTED

## 2017-05-02 LAB — PROTIME-INR
INR: 1.25
INR: 1.31
INR: 1.29
PROTHROMBIN TIME: 15.6 s — AB (ref 11.4–15.2)
PROTHROMBIN TIME: 16 s — AB (ref 11.4–15.2)
Prothrombin Time: 16.2 seconds — ABNORMAL HIGH (ref 11.4–15.2)

## 2017-05-02 LAB — I-STAT CG4 LACTIC ACID, ED: Lactic Acid, Venous: 10.96 mmol/L (ref 0.5–1.9)

## 2017-05-02 LAB — CBC WITH DIFFERENTIAL/PLATELET
Basophils Absolute: 0 10*3/uL (ref 0.0–0.1)
Basophils Absolute: 0 10*3/uL (ref 0.0–0.1)
Basophils Relative: 0 %
Basophils Relative: 0 %
EOS ABS: 0.1 10*3/uL (ref 0.0–0.7)
EOS PCT: 0 %
Eosinophils Absolute: 0 10*3/uL (ref 0.0–0.7)
Eosinophils Relative: 1 %
HCT: 26.1 % — ABNORMAL LOW (ref 39.0–52.0)
HCT: 21.9 % — ABNORMAL LOW (ref 39.0–52.0)
HEMOGLOBIN: 7.3 g/dL — AB (ref 13.0–17.0)
HEMOGLOBIN: 6.5 g/dL — AB (ref 13.0–17.0)
LYMPHS ABS: 2.6 10*3/uL (ref 0.7–4.0)
LYMPHS ABS: 1.4 10*3/uL (ref 0.7–4.0)
Lymphocytes Relative: 30 %
Lymphocytes Relative: 15 %
MCH: 26.4 pg (ref 26.0–34.0)
MCH: 27 pg (ref 26.0–34.0)
MCHC: 28 g/dL — AB (ref 30.0–36.0)
MCHC: 29.7 g/dL — ABNORMAL LOW (ref 30.0–36.0)
MCV: 94.6 fL (ref 78.0–100.0)
MCV: 90.9 fL (ref 78.0–100.0)
MONO ABS: 0.5 10*3/uL (ref 0.1–1.0)
MONOS PCT: 6 %
Monocytes Absolute: 0.5 10*3/uL (ref 0.1–1.0)
Monocytes Relative: 6 %
NEUTROS PCT: 79 %
Neutro Abs: 5.4 10*3/uL (ref 1.7–7.7)
Neutro Abs: 7.2 10*3/uL (ref 1.7–7.7)
Neutrophils Relative %: 63 %
PLATELETS: 147 10*3/uL — AB (ref 150–400)
PLATELETS: 109 10*3/uL — AB (ref 150–400)
RBC: 2.76 MIL/uL — ABNORMAL LOW (ref 4.22–5.81)
RBC: 2.41 MIL/uL — AB (ref 4.22–5.81)
RDW: 14.8 % (ref 11.5–15.5)
RDW: 14.5 % (ref 11.5–15.5)
WBC: 8.5 10*3/uL (ref 4.0–10.5)
WBC: 9.2 10*3/uL (ref 4.0–10.5)

## 2017-05-02 LAB — I-STAT ARTERIAL BLOOD GAS, ED
ACID-BASE EXCESS: 12 mmol/L — AB (ref 0.0–2.0)
Acid-Base Excess: 13 mmol/L — ABNORMAL HIGH (ref 0.0–2.0)
Bicarbonate: 41.1 mmol/L — ABNORMAL HIGH (ref 20.0–28.0)
Bicarbonate: 38.8 mmol/L — ABNORMAL HIGH (ref 20.0–28.0)
O2 SAT: 100 %
O2 SAT: 77 %
PH ART: 7.427 (ref 7.350–7.450)
Patient temperature: 98
Patient temperature: 98
TCO2: 43 mmol/L — AB (ref 22–32)
TCO2: 41 mmol/L — AB (ref 22–32)
pCO2 arterial: 76 mmHg (ref 32.0–48.0)
pCO2 arterial: 58.6 mmHg — ABNORMAL HIGH (ref 32.0–48.0)
pH, Arterial: 7.34 — ABNORMAL LOW (ref 7.350–7.450)
pO2, Arterial: 389 mmHg — ABNORMAL HIGH (ref 83.0–108.0)
pO2, Arterial: 41 mmHg — ABNORMAL LOW (ref 83.0–108.0)

## 2017-05-02 LAB — APTT
APTT: 36 s (ref 24–36)
aPTT: 71 seconds — ABNORMAL HIGH (ref 24–36)
aPTT: 45 seconds — ABNORMAL HIGH (ref 24–36)

## 2017-05-02 LAB — URINALYSIS, ROUTINE W REFLEX MICROSCOPIC
Bilirubin Urine: NEGATIVE
GLUCOSE, UA: NEGATIVE mg/dL
Ketones, ur: NEGATIVE mg/dL
NITRITE: NEGATIVE
Protein, ur: 30 mg/dL — AB
Specific Gravity, Urine: 1.008 (ref 1.005–1.030)
pH: 7 (ref 5.0–8.0)

## 2017-05-02 LAB — PROCALCITONIN: Procalcitonin: 0.1 ng/mL

## 2017-05-02 LAB — I-STAT TROPONIN, ED: TROPONIN I, POC: 0.03 ng/mL (ref 0.00–0.08)

## 2017-05-02 LAB — INFLUENZA PANEL BY PCR (TYPE A & B)
INFLAPCR: NEGATIVE
INFLBPCR: NEGATIVE

## 2017-05-02 LAB — TROPONIN I
TROPONIN I: 3.57 ng/mL — AB (ref <0.03)
TROPONIN I: 3.53 ng/mL — AB (ref <0.03)
Troponin I: 0.03 ng/mL (ref <0.03)

## 2017-05-02 LAB — I-STAT CHEM 8, ED
BUN: 81 mg/dL — ABNORMAL HIGH (ref 6–20)
CREATININE: 2.8 mg/dL — AB (ref 0.61–1.24)
Calcium, Ion: 0.96 mmol/L — ABNORMAL LOW (ref 1.15–1.40)
Chloride: 79 mmol/L — ABNORMAL LOW (ref 101–111)
GLUCOSE: 138 mg/dL — AB (ref 65–99)
HEMATOCRIT: 26 % — AB (ref 39.0–52.0)
HEMOGLOBIN: 8.8 g/dL — AB (ref 13.0–17.0)
Potassium: 4.2 mmol/L (ref 3.5–5.1)
Sodium: 127 mmol/L — ABNORMAL LOW (ref 135–145)
TCO2: 33 mmol/L — ABNORMAL HIGH (ref 22–32)

## 2017-05-02 LAB — HEPARIN LEVEL (UNFRACTIONATED): Heparin Unfractionated: >2.2 IU/mL — ABNORMAL HIGH (ref 0.30–0.70)

## 2017-05-02 LAB — PREPARE RBC (CROSSMATCH)

## 2017-05-02 LAB — CBG MONITORING, ED: GLUCOSE-CAPILLARY: 180 mg/dL — AB (ref 65–99)

## 2017-05-02 LAB — TRIGLYCERIDES: TRIGLYCERIDES: 37 mg/dL (ref <150)

## 2017-05-02 LAB — LACTIC ACID, PLASMA: Lactic Acid, Venous: 1.5 mmol/L (ref 0.5–1.9)

## 2017-05-02 MED ORDER — IPRATROPIUM-ALBUTEROL 0.5-2.5 (3) MG/3ML IN SOLN
3.0000 mL | Freq: Four times a day (QID) | RESPIRATORY_TRACT | Status: DC
  Administered 2017-05-02: 3 mL via RESPIRATORY_TRACT
  Filled 2017-05-02: qty 3

## 2017-05-02 MED ORDER — PIPERACILLIN-TAZOBACTAM 3.375 G IVPB
3.3750 g | Freq: Three times a day (TID) | INTRAVENOUS | Status: DC
  Administered 2017-05-02 – 2017-05-04 (×8): 3.375 g via INTRAVENOUS
  Filled 2017-05-02 (×9): qty 50

## 2017-05-02 MED ORDER — BUDESONIDE 0.5 MG/2ML IN SUSP
0.5000 mg | Freq: Two times a day (BID) | RESPIRATORY_TRACT | Status: DC
  Administered 2017-05-02 – 2017-05-04 (×4): 0.5 mg via RESPIRATORY_TRACT
  Filled 2017-05-02 (×4): qty 2

## 2017-05-02 MED ORDER — CHLORHEXIDINE GLUCONATE CLOTH 2 % EX PADS
6.0000 | MEDICATED_PAD | Freq: Every day | CUTANEOUS | Status: DC
  Administered 2017-05-03 – 2017-05-04 (×3): 6 via TOPICAL

## 2017-05-02 MED ORDER — CHLORHEXIDINE GLUCONATE 0.12% ORAL RINSE (MEDLINE KIT)
15.0000 mL | Freq: Two times a day (BID) | OROMUCOSAL | Status: DC
  Administered 2017-05-02 – 2017-05-04 (×5): 15 mL via OROMUCOSAL

## 2017-05-02 MED ORDER — DEXTROSE 10 % IV SOLN: INTRAVENOUS | Status: DC | PRN

## 2017-05-02 MED ORDER — HEPARIN (PORCINE) IN NACL 100-0.45 UNIT/ML-% IJ SOLN
1200.0000 [IU]/h | INTRAMUSCULAR | Status: DC
  Administered 2017-05-02: 1200 [IU]/h via INTRAVENOUS
  Filled 2017-05-02: qty 250

## 2017-05-02 MED ORDER — FENTANYL BOLUS VIA INFUSION
50.0000 ug | INTRAVENOUS | Status: DC | PRN
  Filled 2017-05-02: qty 50

## 2017-05-02 MED ORDER — VANCOMYCIN HCL 10 G IV SOLR
2000.0000 mg | Freq: Once | INTRAVENOUS | Status: AC
  Administered 2017-05-02: 2000 mg via INTRAVENOUS
  Filled 2017-05-02: qty 2000

## 2017-05-02 MED ORDER — SODIUM CHLORIDE 0.9 % IV SOLN
Freq: Once | INTRAVENOUS | Status: AC
  Administered 2017-05-02: 18:00:00 via INTRAVENOUS

## 2017-05-02 MED ORDER — SODIUM CHLORIDE 0.9 % IV SOLN: INTRAVENOUS | Status: DC | PRN

## 2017-05-02 MED ORDER — NOREPINEPHRINE BITARTRATE 1 MG/ML IV SOLN
0.0000 ug/min | INTRAVENOUS | Status: DC
  Administered 2017-05-02: 20 ug/min via INTRAVENOUS
  Administered 2017-05-03: 15 ug/min via INTRAVENOUS
  Administered 2017-05-03: 20 ug/min via INTRAVENOUS
  Filled 2017-05-02 (×3): qty 16

## 2017-05-02 MED ORDER — PROPOFOL 1000 MG/100ML IV EMUL
INTRAVENOUS | Status: AC
  Filled 2017-05-02: qty 100

## 2017-05-02 MED ORDER — SODIUM CHLORIDE 0.9 % IV SOLN
INTRAVENOUS | Status: DC
  Administered 2017-05-02 (×2): via INTRAVENOUS

## 2017-05-02 MED ORDER — SODIUM CHLORIDE 0.9 % IV BOLUS (SEPSIS)
1000.0000 mL | Freq: Once | INTRAVENOUS | Status: AC
  Administered 2017-05-02: 1000 mL via INTRAVENOUS

## 2017-05-02 MED ORDER — MIDAZOLAM HCL 2 MG/2ML IJ SOLN
2.0000 mg | INTRAMUSCULAR | Status: DC | PRN
  Administered 2017-05-02: 2 mg via INTRAVENOUS
  Filled 2017-05-02: qty 2

## 2017-05-02 MED ORDER — NOREPINEPHRINE BITARTRATE 1 MG/ML IV SOLN
0.0000 ug/min | INTRAVENOUS | Status: DC
  Administered 2017-05-02: 35 ug/min via INTRAVENOUS

## 2017-05-02 MED ORDER — SODIUM CHLORIDE 0.9 % IV SOLN
INTRAVENOUS | Status: DC
  Administered 2017-05-02: 10 mL/h via INTRAVENOUS

## 2017-05-02 MED ORDER — VANCOMYCIN HCL IN DEXTROSE 1-5 GM/200ML-% IV SOLN
1000.0000 mg | INTRAVENOUS | Status: DC
  Administered 2017-05-04: 1000 mg via INTRAVENOUS
  Filled 2017-05-02: qty 200

## 2017-05-02 MED ORDER — ARFORMOTEROL TARTRATE 15 MCG/2ML IN NEBU
15.0000 ug | INHALATION_SOLUTION | Freq: Two times a day (BID) | RESPIRATORY_TRACT | Status: DC
  Administered 2017-05-02 – 2017-05-04 (×5): 15 ug via RESPIRATORY_TRACT
  Filled 2017-05-02 (×5): qty 2

## 2017-05-02 MED ORDER — "THROMBI-PAD 3""X3"" EX PADS"
1.0000 | MEDICATED_PAD | Freq: Once | CUTANEOUS | Status: AC
  Administered 2017-05-02: 1 via TOPICAL
  Filled 2017-05-02: qty 1

## 2017-05-02 MED ORDER — IPRATROPIUM-ALBUTEROL 0.5-2.5 (3) MG/3ML IN SOLN
3.0000 mL | Freq: Four times a day (QID) | RESPIRATORY_TRACT | Status: DC
  Administered 2017-05-02 – 2017-05-04 (×8): 3 mL via RESPIRATORY_TRACT
  Filled 2017-05-02 (×8): qty 3

## 2017-05-02 MED ORDER — NOREPINEPHRINE BITARTRATE 1 MG/ML IV SOLN
0.0000 ug/min | Freq: Once | INTRAVENOUS | Status: AC
  Administered 2017-05-02: 15 ug/min via INTRAVENOUS
  Filled 2017-05-02: qty 4

## 2017-05-02 MED ORDER — INSULIN GLARGINE 100 UNIT/ML ~~LOC~~ SOLN
10.0000 [IU] | SUBCUTANEOUS | Status: DC
  Administered 2017-05-02: 10 [IU] via SUBCUTANEOUS
  Filled 2017-05-02: qty 0.1

## 2017-05-02 MED ORDER — SENNOSIDES 8.8 MG/5ML PO SYRP: 5.0000 mL | ORAL_SOLUTION | Freq: Two times a day (BID) | ORAL | Status: DC | PRN

## 2017-05-02 MED ORDER — FENTANYL 2500MCG IN NS 250ML (10MCG/ML) PREMIX INFUSION
25.0000 ug/h | INTRAVENOUS | Status: DC
  Administered 2017-05-02: 50 ug/h via INTRAVENOUS
  Administered 2017-05-03: 125 ug/h via INTRAVENOUS
  Filled 2017-05-02 (×2): qty 250

## 2017-05-02 MED ORDER — MIDAZOLAM HCL 2 MG/2ML IJ SOLN: 2.0000 mg | INTRAMUSCULAR | Status: DC | PRN

## 2017-05-02 MED ORDER — CHLORHEXIDINE GLUCONATE CLOTH 2 % EX PADS: 6.0000 | MEDICATED_PAD | Freq: Every day | CUTANEOUS | Status: DC

## 2017-05-02 MED ORDER — HEPARIN SODIUM (PORCINE) 5000 UNIT/ML IJ SOLN: 5000.0000 [IU] | Freq: Three times a day (TID) | INTRAMUSCULAR | Status: DC

## 2017-05-02 MED ORDER — VALPROATE SODIUM 500 MG/5ML IV SOLN
1.0000 g | Freq: Once | INTRAVENOUS | Status: AC
  Administered 2017-05-02: 1000 mg via INTRAVENOUS
  Filled 2017-05-02: qty 10

## 2017-05-02 MED ORDER — BUDESONIDE 0.25 MG/2ML IN SUSP
0.2500 mg | Freq: Two times a day (BID) | RESPIRATORY_TRACT | Status: DC
  Administered 2017-05-02: 0.25 mg via RESPIRATORY_TRACT
  Filled 2017-05-02: qty 2

## 2017-05-02 MED ORDER — DEXTROSE 5 % IV SOLN
500.0000 mg | Freq: Two times a day (BID) | INTRAVENOUS | Status: DC
  Administered 2017-05-02 – 2017-05-04 (×4): 500 mg via INTRAVENOUS
  Filled 2017-05-02 (×6): qty 5

## 2017-05-02 MED ORDER — PROPOFOL 1000 MG/100ML IV EMUL
5.0000 ug/kg/min | INTRAVENOUS | Status: DC
  Administered 2017-05-02: 60 ug/kg/min via INTRAVENOUS
  Administered 2017-05-02: 50 ug/kg/min via INTRAVENOUS
  Administered 2017-05-02: 60 ug/kg/min via INTRAVENOUS
  Administered 2017-05-02: 5 ug/kg/min via INTRAVENOUS
  Administered 2017-05-02: 50 ug/kg/min via INTRAVENOUS
  Administered 2017-05-02 – 2017-05-03 (×4): 60 ug/kg/min via INTRAVENOUS
  Filled 2017-05-02 (×9): qty 100

## 2017-05-02 MED ORDER — SODIUM CHLORIDE 0.9% FLUSH
10.0000 mL | Freq: Two times a day (BID) | INTRAVENOUS | Status: DC
  Administered 2017-05-02 – 2017-05-04 (×4): 10 mL

## 2017-05-02 MED ORDER — SODIUM CHLORIDE 0.9 % IV SOLN
INTRAVENOUS | Status: DC
  Administered 2017-05-02: 1.3 [IU]/h via INTRAVENOUS
  Filled 2017-05-02: qty 1

## 2017-05-02 MED ORDER — FENTANYL CITRATE (PF) 100 MCG/2ML IJ SOLN
50.0000 ug | Freq: Once | INTRAMUSCULAR | Status: AC
  Administered 2017-05-02: 50 ug via INTRAVENOUS

## 2017-05-02 MED ORDER — INSULIN ASPART 100 UNIT/ML ~~LOC~~ SOLN: 2.0000 [IU] | SUBCUTANEOUS | Status: DC

## 2017-05-02 MED ORDER — PANTOPRAZOLE SODIUM 40 MG IV SOLR
40.0000 mg | Freq: Every day | INTRAVENOUS | Status: DC
  Administered 2017-05-02 – 2017-05-03 (×2): 40 mg via INTRAVENOUS
  Filled 2017-05-02 (×2): qty 40

## 2017-05-02 MED ORDER — ORAL CARE MOUTH RINSE
15.0000 mL | OROMUCOSAL | Status: DC
  Administered 2017-05-02 – 2017-05-04 (×25): 15 mL via OROMUCOSAL

## 2017-05-02 MED ORDER — SODIUM CHLORIDE 0.9% FLUSH: 10.0000 mL | INTRAVENOUS | Status: DC | PRN

## 2017-05-02 NOTE — Progress Notes (Signed)
Bedside EEG completed, results pending. 

## 2017-05-02 NOTE — ED Triage Notes (Signed)
BIB EMS from home, pt was witnessed cardiac arrest. Pt wife heard pt gurgling in room (pt is vent dependent) she went in to suction and we went unresponsive and had no pulse. CPR started by family. Pt was in PEA upon fire arrival. Upon EMS arrival 3 epi given and after 35-40 min CPR pulses regained. IO in place L tib, 20 L AC. HR 96, BP 88/54, CBG 241. Total 1200NS given. Epi drip running by EMS.   EDP and RT at bedside, pt being placed on vent. Pads placed. EKG obtained.

## 2017-05-02 NOTE — Procedures (Signed)
ELECTROENCEPHALOGRAM REPORT  Date of Study: 04/30/2017  Patient's Name: [name] [name] MRN: [id number] Date of Birth: 1950/08/19  Referring Provider: Hayden Pedro, NP  Clinical History: This is a 66 year old man s/p cardiac arrest   Medications: propofol (DIPRIVAN) 1000 MG/100ML infusion  arformoterol (BROVANA) nebulizer solution 15 mcg  budesonide (PULMICORT) nebulizer solution 0.5 mg  chlorhexidine gluconate (MEDLINE KIT) (PERIDEX) 0.12 % solution 15 mL  Chlorhexidine Gluconate Cloth 2 % PADS 6 each  insulin regular (NOVOLIN R,HUMULIN R) 100 Units in sodium chloride 0.9 % 100 mL (1 Units/mL) infusion  ipratropium-albuterol (DUONEB) 0.5-2.5 (3) MG/3ML nebulizer solution 3 mL  norepinephrine (LEVOPHED) 16 mg in dextrose 5 % 250 mL (0.064 mg/mL) infusion  pantoprazole (PROTONIX) injection 40 mg  piperacillin-tazobactam (ZOSYN) IVPB 3.375 g  sodium chloride flush (NS) 0.9 % injection 10-40 mL  sodium chloride flush (NS) 0.9 % injection 10-40 mL  THROMBI-PAD 3"X3" pad 1 each  vancomycin (VANCOCIN) IVPB 1000 mg/200 mL premix   Technical Summary: A multichannel digital EEG recording measured by the international 10-20 system with electrodes applied with paste and impedances below 5000 ohms performed in our laboratory with EKG monitoring in an intubated and sedated patient.  Hyperventilation and photic stimulation were not performed.  The digital EEG was referentially recorded, reformatted, and digitally filtered in a variety of bipolar and referential montages for optimal display.    Description: The patient is intubated and sedated on Propofol during the recording. There is loss of normal background activity. The background consists of a burst suppression pattern with bursts of medium to high voltage 4-5 Hz theta activity lasting 1 second interspersed with diffuse background suppression lasting 5 to 150 seconds. There is no spontaneous reactivity or reactivity noted with noxious  stimulation.  Hyperventilation and photic stimulation were not performed.Jaw movements were noted with no epileptiform correlate. There were no epileptiform discharges or electrographic seizures seen.   EKG lead showed sinus tachycardia.  Impression: This EEG is markedly abnormal due to burst suppression pattern.  Clinical Correlation of the above findings indicates severe diffuse cerebral dysfunction that is non-specific in etiology and can be seen in the setting of anoxic/ischemic injury, toxic/metabolic encephalopathies, or medication effect from Propofol. There were no electrographic seizures seen. Clinical correlation is advised.  Ellouise Newer, M.D.

## 2017-05-02 NOTE — Progress Notes (Addendum)
[facility] Progress Note Patient Name: [name] [name] DOB: 1951/03/30 MRN: [id number]   Date of Service  04/29/2017  HPI/Events of Note  Hgb = 6.5 d/t R femoral CVL oozing.   eICU Interventions  Will order: 1. Transfuse 1 unit PRBC now. 2. Hold Heparin IV infusion until hemostasis achieved.  3. When hemostasis achieved, restart heparin IV infusion per pharmacy without bolus.  4. Repeat CBC at midnight.      Intervention Category Major Interventions: Hemorrhage - evaluation and management  Sommer,Steven Eugene 04/28/2017, 5:05 PM

## 2017-05-02 NOTE — Progress Notes (Signed)
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 05/03/2017, 6:46 AM

## 2017-05-02 NOTE — Procedures (Signed)
Arterial Catheter Insertion Procedure Note [name] [name] [id number] August 31, 1950  Procedure: Insertion of Arterial Catheter  Indications: Blood pressure monitoring  Procedure Details Consent: Unable to obtain consent because of emergent medical necessity. Time Out: Verified patient identification, verified procedure, site/side was marked, verified correct patient position, special equipment/implants available, medications/allergies/relevent history reviewed, required imaging and test results available.  Performed  Maximum sterile technique was used including antiseptics, cap, gloves, gown, hand hygiene, mask and sheet. Skin prep: Chlorhexidine; local anesthetic administered 20 gauge catheter was inserted into right radial artery using the Seldinger technique.  Evaluation Blood flow good; BP tracing good. Complications: No apparent complications.   Alvera Singh 04/27/2017

## 2017-05-02 NOTE — H&P (Addendum)
PULMONARY / CRITICAL CARE MEDICINE   Name: [name] [name] MRN: [id number] DOB: 05/15/1951    ADMISSION DATE:  05/12/2017 CONSULTATION DATE:  05/10/2017  REFERRING MD:  Dr. Dina Rich    CHIEF COMPLAINT:  Cardiac Arrest   HISTORY OF PRESENT ILLNESS:   66 year old male with extensive PMH including diastolic HF, CAD s/p CABG, CKD stage 3, COPD/OSA s.p tracheostomy on chronic night vent, DM, HTN, A.Fib/Aflutter on eliquis   Presents to ED on 9/18 after being found at home unresponsive. Wife states that she heard ventilator beeping when she went into patient's room and found him non-responsive. EMS was called, Asystole/PEA, ROSC within 30-40 minutes. Upon arrival to ED patient obtunded with myoclonic seizure activity. LA 10.96. WBC 8.5. PCCM asked to admit.    Recent Admission 9/5-9/8 with new onset A.fib RVR and UTI  PAST MEDICAL HISTORY :  He  has a past medical history of Anemia; Angina; Anxiety; Arthritis; CHF (congestive heart failure) ([place]); Chronic airway obstruction, not elsewhere classified; Chronic diastolic CHF (congestive heart failure) ([place]) (10/28/2007); Chronic kidney disease; Chronic pulmonary heart disease, unspecified; Chronic renal insufficiency, stage III (moderate); Chronic respiratory failure ([facility]); COPD (chronic obstructive pulmonary disease) ([place]); Coronary atherosclerosis of unspecified type of vessel, native or graft; GERD (gastroesophageal reflux disease); History of gout; Hyperlipidemia; Hypertension; Obstructive sleep apnea (10/28/2007); OSA (obstructive sleep apnea); PVD (peripheral vascular disease) ([place]) (07/13/2011); PVD (peripheral vascular disease) ([place]); S/P CABG (coronary artery bypass graft) (x 6 2000 or 2001); Shortness of breath dyspnea; Swelling of limb; TIA (transient ischemic attack); Type 2 diabetes mellitus with vascular disease ([place]) (07/13/2011); and Unspecified sleep apnea.  PAST SURGICAL HISTORY: He  has a past surgical history that includes Coronary  artery bypass graft (07/12/2000); Appendectomy; Bilateral VATS ablation; hemmorhoids; lung sx (2005); Esophagogastroduodenoscopy (08/11/2011); Colonoscopy (08/11/2011); Cataract extraction; Mitral valve replacement (04/16/2008); Cardiac catheterization (02/16/2010); Cardiac catheterization (03/29/2009); Cardiac catheterization (08/05/2004); Cardiac catheterization (06/28/2000); Abdominal Aortogram (12/03/2002); Tracheostomy tube placement (N/A, 05/23/2013); Coronary angioplasty with stent; Colonoscopy with propofol (N/A, 09/07/2014); and Esophagogastroduodenoscopy (egd) with propofol (N/A, 09/07/2014).  Allergies  Allergen Reactions  . Amlodipine Besy-Benazepril Hcl Swelling    LIPS SWELL ANGIOEDEMA  . Percocet [Oxycodone-Acetaminophen] Other (See Comments)    HALLUCINATIONS    No current facility-administered medications on file prior to encounter.    Current Outpatient Prescriptions on File Prior to Encounter  Medication Sig  . acetaminophen (TYLENOL) 500 MG tablet Take 500 mg by mouth every 6 (six) hours as needed for mild pain or moderate pain.  Marland Kitchen albuterol (PROVENTIL) (2.5 MG/3ML) 0.083% nebulizer solution Take 3 mLs (2.5 mg total) by nebulization every 4 (four) hours as needed for wheezing or shortness of breath.  . allopurinol (ZYLOPRIM) 100 MG tablet Take 100 mg by mouth daily.   Marland Kitchen apixaban (ELIQUIS) 5 MG TABS tablet Take 1 tablet (5 mg total) by mouth 2 (two) times daily.  Marland Kitchen arformoterol (BROVANA) 15 MCG/2ML NEBU Take 2 mLs (15 mcg total) by nebulization 2 (two) times daily.  . budesonide (PULMICORT) 0.5 MG/2ML nebulizer solution USE ONE VIAL IN NEBULIZER TWICE DAILY  . COLCRYS 0.6 MG tablet Take 0.6 mg by mouth daily as needed (gout).   Marland Kitchen dextromethorphan-guaiFENesin (MUCINEX DM) 30-600 MG 12hr tablet Take 1 tablet by mouth 2 (two) times daily. Patient takes for mucus relief  . diltiazem (CARDIZEM CD) 240 MG 24 hr capsule Take 1 capsule (240 mg total) by mouth daily.  . furosemide (LASIX)  40 MG tablet TAKE TWO TABLETS BY MOUTH IN THE  MORNING AND ONE TABLET IN THE EVENING (Patient taking differently: TAKE TWO TABLETS (80mg ) BY MOUTH IN THE MORNING AND ONE TABLET (40mg ) IN THE EVENING)  . metolazone (ZAROXOLYN) 2.5 MG tablet TAKE ONE TABLET BY MOUTH ONCE DAILY ON  MONDAY,  WEDNESDAY,  AND  FRIDAY  30  MINUTES  BEFORE  LASIX (Patient taking differently: TAKE ONE TABLET (2.5mg ) BY MOUTH ONCE DAILY ON  MONDAY,  WEDNESDAY,  AND  FRIDAY  30  MINUTES  BEFORE  LASIX)  . nitroGLYCERIN (NITROSTAT) 0.4 MG SL tablet Place 1 tablet (0.4 mg total) under the tongue every 5 (five) minutes as needed. For chest pain    FAMILY HISTORY:  His indicated that his mother is deceased. He indicated that his father is deceased. He indicated that the status of his neg hx is unknown.    SOCIAL HISTORY: He  reports that he quit smoking about 18 years ago. His smoking use included Cigarettes. He has a 25.00 pack-year smoking history. He has never used smokeless tobacco. He reports that he does not drink alcohol or use drugs.  REVIEW OF SYSTEMS:   Unable to review as patient is encephalopathic   SUBJECTIVE:    VITAL SIGNS: BP (!) 91/56   Pulse 74   Temp (!) 97.3 F (36.3 C) (Temporal)   Resp (!) 24   Ht 6\' 1"  (1.854 m)   Wt 106.6 kg (235 lb)   SpO2 100%   BMI 31.00 kg/m   HEMODYNAMICS:    VENTILATOR SETTINGS: Vent Mode: PRVC FiO2 (%):  [40 %-100 %] 40 % Set Rate:  [24 bmp] 24 bmp Vt Set:  [322 mL] 620 mL PEEP:  [5 cmH20] 5 cmH20 Plateau Pressure:  [35 cmH20] 35 cmH20  INTAKE / OUTPUT: No intake/output data recorded.  PHYSICAL EXAMINATION: General: Adult male, on vent  Neuro:  GCS 3, pupils 2 mm sluggish HEENT:  Trach in place  Cardiovascular:  RRR, no MRG Lungs:  Diminished breath sounds, no wheeze/crackles  Abdomen:  Obese, active bowel sounds  Musculoskeletal:  +2 pedal edema  Skin:  Dry, intact   LABS:  BMET  Recent Labs Lab 05/06/2017 0305 04/25/2017 0314  NA 129* 127*   K 4.2 4.2  CL 79* 79*  CO2 31  --   BUN 75* 81*  CREATININE 2.99* 2.80*  GLUCOSE 140* 138*    Electrolytes  Recent Labs Lab 04/23/2017 0305  CALCIUM 8.4*    CBC  Recent Labs Lab 05/03/2017 0305 04/21/2017 0314  WBC 8.5  --   HGB 7.3* 8.8*  HCT 26.1* 26.0*  PLT 147*  --     Coag's No results for input(s): APTT, INR in the last 168 hours.  Sepsis Markers  Recent Labs Lab 05/11/2017 0314  LATICACIDVEN 10.96*    ABG  Recent Labs Lab 04/25/2017 0312  PHART 7.340*  PCO2ART 76.0*  PO2ART 389.0*    Liver Enzymes  Recent Labs Lab 04/14/2017 0305  AST 42*  ALT 17  ALKPHOS 80  BILITOT 0.4  ALBUMIN 3.1*    Cardiac Enzymes No results for input(s): TROPONINI, PROBNP in the last 168 hours.  Glucose No results for input(s): GLUCAP in the last 168 hours.  Imaging Dg Chest Portable 1 View  Result Date: 04/22/2017 CLINICAL DATA:  Post cardiac arrest EXAM: PORTABLE CHEST 1 VIEW COMPARISON:  04/18/2017 FINDINGS: Tracheostomy tube tip is about 4.6 cm superior to the carina. Post sternotomy changes. Cardiomegaly. Moderate diffuse interstitial and alveolar opacity left greater than right. Possible  small pleural effusions. No pneumothorax. Left pleural calcification. IMPRESSION: Cardiomegaly with moderate left greater than right interstitial and alveolar opacity which may reflect pulmonary edema or diffuse infection. Suspect that there are small pleural effusions. Electronically Signed   By: Donavan Foil M.D.   On: 05/05/2017 03:22     STUDIES:  CXR 9/18 >  Cardiomegaly with moderate left greater than right interstitial and alveolar opacity which may reflect pulmonary edema or diffuse infection. Suspect that there are small pleural effusions. CT Head 9/18 >> CT Chest 9/18 >> ECHO 9/18 >> EEG 9/18 >>  CULTURES: Blood 9/18 >> Urine 9/18 >> Sputum 9/18 >>  ANTIBIOTICS: Vancomycin 9/18 >> Zosyn 9/18 >>   SIGNIFICANT EVENTS: 9/19 > Presents to ED s/p Cardiac  Arrest   LINES/TUBES: Trach >> Right Radial Aline 9/19 >> Left Femoral CVC 9/19 >>  DISCUSSION: 66 year old male with extensive PMH, presents to ED after being found unresponsive. S/P Cardiac Arrest with return of ROSC after 30-40 minutes (unwitnessed unknown down time)   ASSESSMENT / PLAN:  PULMONARY A: Acute on Chronic Hypercarbic/Hypoxic Respiratory Failure +/-HCAP with bilateral opacities, left greater than right  H/O OSA S/P Trach - Vent at HS, COPD P:   Vent Support  CXR/ABG  Trach Care per protocol  Pulmicort/Brovana/Duoneb Coverage with broad spectrum antibiotics Aggressive pulmonary toilet  CARDIOVASCULAR A:  Septic Shock  PEA/Astyolic Cardiac Arrest 71-06 minutes  Chronic Diastolic HF  A.Fib/Aflutter on eliquis  H/O HTN, CAD s/p CABG P:  Cardiac Monitoring  Maintain MAP >70 Trend Troponin  Obtain EKG ECHO- last EF 55-60% Hypothermia 36 degrees   Trend CVP   RENAL A:   Acute on CKD stage 3 (Base Crt 2.1-2.5) s/p 3L  Hyponatremia Lactic Acidosis   Acid base: Primary respiratory acidosis chronic with metabolic alkalosis  and anion gap acidosis  P:   Trend BMP Replace electrolytes as indicated  Trend LA  NS @ 125 ml/hr   GASTROINTESTINAL A:   Transaminase  P:   NPO PPI Trend LFT   HEMATOLOGIC A:   Anemia of Chronic disease   P:  Trend CBC  Maintain Hbg >8 given cardiac history DVT ppx with heparin and SCDs  INFECTIOUS A:   ?Septic Shock secondary to HCAP?  -Recent Urosepsis secondary to UTI  P:   Trend WBC and Fever Curve  RVP and Flu pending  Follow Culture data  Trend LA and PCT  Vancomycin and Zosyn   ENDOCRINE A:   DM    P:   Trend Glucose  SSI   NEUROLOGIC A:   Metabolic vs Anoxic Encephalopathy  -Myoclonic Seizures noted  P:   RASS goal: 0 Neurology Consulted  EEG  CT Head    FAMILY  - Updates: Wife updated at bedside   - Inter-disciplinary family meet or Palliative Care meeting due by:  05/09/2017    CC  Time: 39 minutes   Hayden Pedro, AGACNP-BC [place]  Pgr: [phone]  PCCM Pgr: [phone]  STAFF NOTE  I, Dr Seward Carol agree with NP Dewaine Oats' note and  have personally reviewed patient's available data, including medical history, events of note, physical examination and test results as part of my evaluation. I have discussed with resident/NP and other care providers such as pharmacist, RN and RRT.  In addition,  I personally evaluated patient  66 yr old male with PMHx diastolic HF, CAD s/p CABG, CKD stage 3, COPD/OSA s.p tracheostomy on chronic night vent, DM, HTN,  A.Fib/Aflutter on Eliquis presented s/p arrest suspected PEA from Acute on Chronic Hypercarbic/Hypoxic Respiratory Failure . Downtime clarified as 30-40 minutes decision made to initiate CODE COOL at 36. Sepsis elevated WBC, Lactic and CXR shows moderate left greater than right interstitial and alveolar opacities. Possible source HCAP/VAP pt on a home vent at night and has been having increased secretions per family. Pan cultures obtained, trending of LA started on empiric antibiotics Vancomycin and Zosyn. Acid base abnormality noted anion gap metabolic acidosis secondary to lactic acidosis most likely from hypoperfusion continue IVF, metabolic alkalosis compensating for a primary acid base problem of respiratory acidosis. Ph on last ABG 7.4. Initial PO2 >300 on 100% FiO2 FiOs was decreased.  Post initiation of CODE COOL Pt having myoclonic activity Consulted Neuro EEG started and case discussed with consultants. Glycemic control per ICU protocol Prophylaxis woth Protonix, Heparin and SCDs in place.    The patient is critically ill with multiple organ systems failure and requires high complexity decision making for assessment and support, frequent evaluation and titration of therapies, application of advanced monitoring technologies and extensive interpretation of multiple databases.  Critical Care  Time devoted to patient care services described in this note is 110 minutes. This time reflects time of care of this signee Dr Seward Carol. This critical care time does not reflect procedure time, or teaching time or supervisory time of NP but could involve care discussion time    Dr. Seward Carol [facility]  04/23/2017 5:22 AM

## 2017-05-02 NOTE — Progress Notes (Signed)
Pharmacy Antibiotic Note  [name] [name] is a 66 y.o. male admitted on 04/29/2017 s/p cardiac arrest, chronic trach and possible sepsis.  Pharmacy has been consulted for Vancomycin and Zosyn  dosing.  Plan: Vancomycin 2 g IV now, then 1 g IV q48h Zosyn 3.375 g IV q8h   Height: 6\' 1"  (185.4 cm) Weight: 235 lb (106.6 kg) IBW/kg (Calculated) : 79.9  Temp (24hrs), Avg:97.3 F (36.3 C), Min:97.3 F (36.3 C), Max:97.3 F (36.3 C)   Recent Labs Lab 04/19/2017 0305 05/03/2017 0314  WBC 8.5  --   CREATININE 2.99* 2.80*  LATICACIDVEN  --  10.96*    Estimated Creatinine Clearance: 33.7 mL/min (A) (by C-G formula based on SCr of 2.8 mg/dL (H)).    Allergies  Allergen Reactions  . Amlodipine Besy-Benazepril Hcl Swelling    LIPS SWELL ANGIOEDEMA  . Percocet [Oxycodone-Acetaminophen] Other (See Comments)    HALLUCINATIONS    Caryl Pina 04/17/2017 4:13 AM

## 2017-05-02 NOTE — ED Provider Notes (Signed)
[facility] DEPT Provider Note   CSN: [id number] Arrival date & time: 04/20/2017  0248     History   Chief Complaint Chief Complaint  Patient presents with  . Cardiac Arrest    HPI [name] [name] is a 66 y.o. male.  HPI  This is a 66 year old male with a history of heart failure, chronic airway obstruction, atrial fibrillation, hypertension, hyperlipidemia, obstructive sleep apnea with a chronic trach used only at night who presents following cardiac arrest. Per EMS report, they were called out for a nonresponsive patient. Wife noted gurgling breath sounds and went to suction the patient and noted that he was unresponsive. She noted absence of pulse.Upon fire arrival, AED did not recommend shocking. Upon EMS arrival, he appeared to be in [place]. He received CPR for approximately 35 minutes with 3 rounds of epinephrine. He was placed on epinephrine drip and was hypotensive most of the time for EMS.  Level V caveat for acuity of condition  Past Medical History:  Diagnosis Date  . Anemia   . Angina   . Anxiety   . Arthritis    gout  . CHF (congestive heart failure) ([place])   . Chronic airway obstruction, not elsewhere classified   . Chronic diastolic CHF (congestive heart failure) ([place]) 10/28/2007   Hospitalized with diastolic CHF controlled with diuresis and BiPAP 2D April 2012 EF 55-60 with severe LVH and grade 1 diastolic dysfunction    . Chronic kidney disease    sees dr Louanna Raw every 6 months  . Chronic pulmonary heart disease, unspecified   . Chronic renal insufficiency, stage III (moderate)   . Chronic respiratory failure ([place])   . COPD (chronic obstructive pulmonary disease) ([place])   . Coronary atherosclerosis of unspecified type of vessel, native or graft   . GERD (gastroesophageal reflux disease)   . History of gout   . Hyperlipidemia   . Hypertension   . Obstructive sleep apnea 10/28/2007  . OSA (obstructive sleep apnea)   . PVD (peripheral vascular disease)  ([place]) 07/13/2011   S/P Lt SFA PTA Sept 2009, known occluded Rt SFA   . PVD (peripheral vascular disease) ([place])   . S/P CABG (coronary artery bypass graft) x 6 2000 or 2001   Nuc, 09/17/2012 - Significant ST abnormalities that are suggestive of ischemia, low risk nuclear study  . Shortness of breath dyspnea    with activity  . Swelling of limb    Venous Duplex, 09/03/2012 - no evidence of thrombus or thrombophelitis  . TIA (transient ischemic attack)    2D Echo, 12/05/2010 - EF 55-60%, severe LVH  . Type 2 diabetes mellitus with vascular disease ([place]) 07/13/2011   Type 2   . Unspecified sleep apnea    uses oxygen 4 liter per minute all the time by trach    Patient Active Problem List   Diagnosis Date Noted  . Atrial flutter ([place]) 04/18/2017  . Hypotension 04/18/2017  . Normocytic anemia 04/18/2017  . UTI (urinary tract infection) 04/18/2017  . Sepsis ([place]) 04/18/2017  . COPD exacerbation ([place]) 08/09/2016  . 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 Pneumonia due to Pseudomonas ([place]) 01/06/2015  . Hypercapnic respiratory failure, chronic ([place]) 11/18/2014  . Lobar pneumonia due to unspecified organism 11/18/2014  . SIRS (systemic inflammatory response syndrome) (H[facility])   . Dyspnea and respiratory abnormality 05/12/2014  . Thrombocytopenia, unspecified ([place]) 06/12/2013  . Acute on chronic renal failure ([place]) 06/12/2013  . Tracheostomy status ([place]) 06/02/2013  . Gout flare 05/31/2013  .  Morbid obesity  03/30/2013  . Encephalopathy acute 01/16/2012  . Respiratory acidosis 01/16/2012  . Acute respiratory failure with hypoxia ([place]) 01/15/2012  . Anemia, past Hx GI bleed 5/12 07/14/2011  . PVD (peripheral vascular disease) ([place]) 07/13/2011  . CKD (chronic kidney disease) stage 3, GFR 30-59 ml/min 07/13/2011  . Type 2 diabetes mellitus with vascular disease ([place]) 07/13/2011  . Hypertensive heart disease 07/13/2011  . OSA (obstructive sleep apnea) 10/28/2007  . CAD (coronary artery  disease) 10/28/2007  . Chronic diastolic CHF (congestive heart failure) ([place]) 10/28/2007  . COPD (chronic obstructive pulmonary disease) ([place]) 10/28/2007  . Chronic respiratory failure with hypoxia and hypercapnia ([place]) 10/28/2007  . Obstructive sleep apnea 10/28/2007    Past Surgical History:  Procedure Laterality Date  . Abdominal Aortogram  12/03/2002   70% stenosis at the origin of the profunda femoris, 75% mid superficial femoral artery stenosis  . APPENDECTOMY    . BILATERAL VATS ABLATION    . CARDIAC CATHETERIZATION  02/16/2010   RCA-80% stenosis stented with a 3x12 Taxus ION DES resulting in reduction of a total occlusion to 0%  . CARDIAC CATHETERIZATION  03/29/2009   Ostial/Proximal PDA-SVG-75% stenosis stented with a 3.0x23 Xience V DES reduction resulting in 75% stenosis to 0%  . CARDIAC CATHETERIZATION  08/05/2004   Circumflex OM stented with a 2.5x13 Cypher stent resulting in reduction of 60% to 0%  . CARDIAC CATHETERIZATION  06/28/2000   Moderate left main and severe three-vessel disease, consider CABG  . CATARACT EXTRACTION    . COLONOSCOPY  08/11/2011   Procedure: COLONOSCOPY;  Surgeon: Missy Sabins, MD;  Location: [facility];  Service: Endoscopy;  Laterality: N/A;  . COLONOSCOPY WITH PROPOFOL N/A 09/07/2014   Procedure: COLONOSCOPY WITH PROPOFOL;  Surgeon: Missy Sabins, MD;  Location: [facility] END[facility]COPY;  Service: Endoscopy;  Laterality: N/A;  . CORONARY ANGIOPLASTY WITH STENT PLACEMENT     x 2  . CORONARY ARTERY BYPASS GRAFT  07/12/2000   x6,  . ESOPHAGOGASTRODUODENOSCOPY  08/11/2011   Procedure: ESOPHAGOGASTRODUODENOSCOPY (EGD);  Surgeon: Missy Sabins, MD;  Location: [facility] ENDOSCOPY;  Service: Endoscopy;  Laterality: N/A;  . ESOPHAGOGASTRODUODENOSCOPY (EGD) WITH PROPOFOL N/A 09/07/2014   Procedure: ESOPHAGOGASTRODUODENOSCOPY (EGD) WITH PROPOFOL;  Surgeon: Missy Sabins, MD;  Location: [facility] ENDOSCOPY;  Service: Endoscopy;  Laterality: N/A;  . hemmorhoids    . lung sx  2005    Growth on outside of right lung  . PTCA  04/16/2008   Left SFA-60% stenosis stented with a 7x6 Nitinolself-expanding stent resulting in reduction of 60% mid segmental L SFA stenosis to 0% residual  . TRACHEOSTOMY TUBE PLACEMENT N/A 05/23/2013   Procedure: TRACHEOSTOMY;  Surgeon: Melida Quitter, MD;  Location: [facility];  Service: ENT;  Laterality: N/A;       Home Medications    Prior to Admission medications   Medication Sig Start Date End Date Taking? Authorizing Provider  acetaminophen (TYLENOL) 500 MG tablet Take 500 mg by mouth every 6 (six) hours as needed for mild pain or moderate pain.    [provider]  albuterol (PROVENTIL) (2.5 MG/3ML) 0.083% nebulizer solution Take 3 mLs (2.5 mg total) by nebulization every 4 (four) hours as needed for wheezing or shortness of breath. 08/09/16   Tanda Rockers, MD  allopurinol (ZYLOPRIM) 100 MG tablet Take 100 mg by mouth daily.  10/08/13   [provider]  apixaban (ELIQUIS) 5 MG TABS tablet Take 1 tablet (5 mg total) by mouth 2 (two) times daily.  04/21/17   Doreatha Lew, MD  arformoterol (BROVANA) 15 MCG/2ML NEBU Take 2 mLs (15 mcg total) by nebulization 2 (two) times daily. 05/24/16   Parrett, Tammy S, NP  budesonide (PULMICORT) 0.5 MG/2ML nebulizer solution USE ONE VIAL IN NEBULIZER TWICE DAILY 05/24/16   Parrett, Tammy S, NP  COLCRYS 0.6 MG tablet Take 0.6 mg by mouth daily as needed (gout).  04/28/13   [provider]  dextromethorphan-guaiFENesin (MUCINEX DM) 30-600 MG 12hr tablet Take 1 tablet by mouth 2 (two) times daily. Patient takes for mucus relief    [provider]  diltiazem (CARDIZEM CD) 240 MG 24 hr capsule Take 1 capsule (240 mg total) by mouth daily. 04/21/17   Doreatha Lew, MD  furosemide (LASIX) 40 MG tablet TAKE TWO TABLETS BY MOUTH IN THE MORNING AND ONE TABLET IN THE EVENING Patient taking differently: TAKE TWO TABLETS (80mg ) BY MOUTH IN THE MORNING AND ONE TABLET (40mg ) IN THE EVENING  03/14/17   Lorretta Harp, MD  metolazone (ZAROXOLYN) 2.5 MG tablet TAKE ONE TABLET BY MOUTH ONCE DAILY ON  MONDAY,  WEDNESDAY,  AND  FRIDAY  30  MINUTES  BEFORE  LASIX Patient taking differently: TAKE ONE TABLET (2.5mg ) BY MOUTH ONCE DAILY ON  MONDAY,  WEDNESDAY,  AND  FRIDAY  [place] 09/13/15   Lorretta Harp, MD  nitroGLYCERIN (NITROSTAT) 0.4 MG SL tablet Place 1 tablet (0.4 mg total) under the tongue every 5 (five) minutes as needed. For chest pain 01/28/14   Lorretta Harp, MD    Family History Family History  Problem Relation Age of Onset  . Diabetes Sister   . Hypertension Sister   . Cancer Sister   . Heart attack Neg Hx   . Stroke Neg Hx     Social History Social History  Substance Use Topics  . Smoking status: Former Smoker    Packs/day: 1.00    Years: 25.00    Types: Cigarettes    Quit date: 08/14/1998  . Smokeless tobacco: Never Used  . Alcohol use No     Allergies   Amlodipine besy-benazepril hcl and Percocet [oxycodone-acetaminophen]   Review of Systems Review of Systems  Unable to perform ROS: Acuity of condition     Physical Exam Updated Vital Signs BP (!) 104/50 (BP Location: Right Arm)   Pulse 90   Temp (!) 97.3 F (36.3 C) (Temporal)   Resp 15   Ht 6\' 1"  (1.854 m)   Wt 106.6 kg (235 lb)   SpO2 100%   BMI 31.00 kg/m   Physical Exam  Constitutional:  Ill-appearing, unresponsive  HENT:  Head: Normocephalic and atraumatic.  Eyes:  Pupils 2 mm and minimally reactive bilaterally  Neck:  Tracheostomy in place  Cardiovascular: Normal rate, regular rhythm and normal heart sounds.   No murmur heard. Pulmonary/Chest: Effort normal. No respiratory distress. He has no wheezes. He has rales.  Coarse breath sounds bilaterally, occasional apneic breathing over bagging  Abdominal: Soft. Bowel sounds are normal. He exhibits distension.  Musculoskeletal: He exhibits edema.  3+ pitting bilateral lower extremity edema    Neurological: GCS eye subscore is 1. GCS verbal subscore is 1. GCS motor subscore is 1.  Skin: Skin is warm and dry.  Psychiatric:  Unable to evaluate  Nursing note and vitals reviewed.    ED Treatments / Results  Labs (all labs ordered are listed, but only abnormal results are displayed) Labs Reviewed  I-STAT  ARTERIAL BLOOD GAS, ED - Abnormal; Notable for the following:       Result Value   pH, Arterial 7.340 (*)    pCO2 arterial 76.0 (*)    pO2, Arterial 389.0 (*)    Bicarbonate 41.1 (*)    TCO2 43 (*)    Acid-Base Excess 13.0 (*)    All other components within normal limits  CULTURE, BLOOD (ROUTINE X 2)  CULTURE, BLOOD (ROUTINE X 2)  CULTURE, RESPIRATORY (NON-EXPECTORATED)  CBC WITH DIFFERENTIAL/PLATELET  COMPREHENSIVE METABOLIC PANEL  PROCALCITONIN  LACTIC ACID, PLASMA  LACTIC ACID, PLASMA  TROPONIN I  TROPONIN I  TROPONIN I  I-STAT CG4 LACTIC ACID, ED  I-STAT TROPONIN, ED  I-STAT CHEM 8, ED    EKG  EKG Interpretation  Date/Time:  Wednesday May 02 2017 02:53:26 EDT Ventricular Rate:  90 PR Interval:    QRS Duration: 178 QT Interval:  397 QTC Calculation: 486 R Axis:   -69 Text Interpretation:  Sinus rhythm Prolonged PR interval Left atrial enlargement RBBB and LAFB ST depression, consider ischemia, diffuse lds prominent anteriorly and laterally Confirmed by Thayer Jew [phone]) on 04/28/2017 2:55:30 AM       Radiology No results found.  Procedures Procedures (including critical care time)  CRITICAL CARE Performed by: Merryl Hacker   Total critical care time: 40 minutes  Critical care time was exclusive of separately billable procedures and treating other patients.  Critical care was necessary to treat or prevent imminent or life-threatening deterioration.  Critical care was time spent personally by me on the following activities: development of treatment plan with patient and/or surrogate as well as nursing, discussions with  consultants, evaluation of patient's response to treatment, examination of patient, obtaining history from patient or surrogate, ordering and performing treatments and interventions, ordering and review of laboratory studies, ordering and review of radiographic studies, pulse oximetry and re-evaluation of patient's condition.   Medications Ordered in ED Medications - No data to display   Initial Impression / Assessment and Plan / ED Course  I have reviewed the triage vital signs and the nursing notes.  Pertinent labs & imaging results that were available during my care of the patient were reviewed by me and considered in my medical decision making (see chart for details).     Patient presents following cardiac arrest. PEA arrest. He is minimally responsive with a GCS of 3.  Poor neurologic exam.  No evidence of STEMI on EKG.  Basic labwork obtained as well as chest x-ray. Critical care consulted for admission.  Unclear etiology of her rest. Could be hypoxic or electrolyte related. Patient did require reinitiation of levophed for blood pressure. Defer to critical care for further management.  Final Clinical Impressions(s) / ED Diagnoses   Final diagnoses:  Cardiac arrest [facility])    New Prescriptions New Prescriptions   No medications on file     Merryl Hacker, MD 04/23/2017 [phone]

## 2017-05-02 NOTE — Progress Notes (Signed)
Patient central line site continues oozing. Heparin gtt has been off for 5 hours, pharmacy spoke with RN, and RN spoke with E-Link MD. MD made aware of situation.   Orders to not restart heparin until oozing has stopped. Central line site to be stitched a bit further.   RN will continue to monitor patient.  Holten Spano E Reola Mosher, [place]

## 2017-05-02 NOTE — Progress Notes (Signed)
   05/08/2017 0400  Clinical Encounter Type  Visited With Family  Visit Type Critical Care  Referral From Nurse  Consult/Referral To Chaplain  Stress Factors  Family Stress Factors Major life changes   Was paged to the ED for a CPR patient in route.  Talked with EMS who let me know spouse was on her way.  Met the spouse and got her situated prior to PA speaking with her.  Provided hospitality as she called other family members to come.  Escorted spouse and several other family members back to be with the patient and offered spiritual and emotional support.  Patient will be moved to [facility] and will pass on to the Chaplain for for that area for follow up. Chaplain Katherene Ponto

## 2017-05-02 NOTE — Progress Notes (Signed)
[facility] Progress Note Patient Name: [name] [name] DOB: 1950-10-31 MRN: [id number]   Date of Service  04/19/2017  HPI/Events of Note  Patient with significant oozing from femoral CVL site. Nurse has placed Thrombipad and pressure dressing. Last Hgb = 7.2.  eICU Interventions  Will order: 1. Sandbag to CVL site. 2. CBC with platelet count STAT.   If Hgb has dropped significantly, will need to hold Heparin IV infusion.     Intervention Category Major Interventions: Hemorrhage - evaluation and management  Sommer,Steven Eugene 05/01/2017, 4:04 PM

## 2017-05-02 NOTE — ED Notes (Signed)
Cooling pads placed on pt

## 2017-05-02 NOTE — Progress Notes (Signed)
ANTICOAGULATION CONSULT NOTE - Initial Consult  Pharmacy Consult for heparin  Indication: atrial fibrillation  Allergies  Allergen Reactions  . Amlodipine Besy-Benazepril Hcl Swelling    LIPS SWELL ANGIOEDEMA  . Percocet [Oxycodone-Acetaminophen] Other (See Comments)    HALLUCINATIONS    Patient Measurements: Height: _0  (185.4 cm) Weight: 235 lb (106.6 kg) IBW/kg (Calculated) : 79.9 Heparin Dosing Weight: 102kg  Vital Signs: Temp: 96.6 F (35.9 C) (09/19 1200) Temp Source: Core (Comment) (09/19 0800) BP: 102/61 (09/19 1100) Pulse Rate: 79 (09/19 1200)  Labs:  Recent Labs  04/21/2017 0305 05/10/2017 0314 04/27/2017 0653 04/20/2017 1045  HGB 7.3* 8.8* 7.2*  --   HCT 26.1* 26.0* 24.6*  --   PLT 147*  --  148*  --   APTT  --   --  36  --   LABPROT  --   --  15.6*  --   INR  --   --  1.25  --   HEPARINUNFRC  --   --   --  >2.20*  CREATININE 2.99* 2.80* 2.88*  --   TROPONINI 0.03*  --  0.61*  --     Estimated Creatinine Clearance: 32.8 mL/min (A) (by C-G formula based on SCr of 2.88 mg/dL (H)).   Medical History: Past Medical History:  Diagnosis Date  . Anemia   . Angina   . Anxiety   . Arthritis    gout  . CHF (congestive heart failure) ([place])   . Chronic airway obstruction, not elsewhere classified   . Chronic diastolic CHF (congestive heart failure) ([place]) 10/28/2007   Hospitalized with diastolic CHF controlled with diuresis and BiPAP 2D April 2012 EF 55-60 with severe LVH and grade 1 diastolic dysfunction    . Chronic kidney disease    sees dr Louanna Raw every 6 months  . Chronic pulmonary heart disease, unspecified   . Chronic renal insufficiency, stage III (moderate)   . Chronic respiratory failure ([place])   . COPD (chronic obstructive pulmonary disease) ([place])   . Coronary atherosclerosis of unspecified type of vessel, native or graft   . GERD (gastroesophageal reflux disease)   . History of gout   . Hyperlipidemia   . Hypertension   . Obstructive sleep  apnea 10/28/2007  . OSA (obstructive sleep apnea)   . PVD (peripheral vascular disease) ([place]) 07/13/2011   S/P Lt SFA PTA Sept 2009, known occluded Rt SFA   . PVD (peripheral vascular disease) ([place])   . S/P CABG (coronary artery bypass graft) x 6 2000 or 2001   Nuc, 09/17/2012 - Significant ST abnormalities that are suggestive of ischemia, low risk nuclear study  . Shortness of breath dyspnea    with activity  . Swelling of limb    Venous Duplex, 09/03/2012 - no evidence of thrombus or thrombophelitis  . TIA (transient ischemic attack)    2D Echo, 12/05/2010 - EF 55-60%, severe LVH  . Type 2 diabetes mellitus with vascular disease ([place]) 07/13/2011   Type 2   . Unspecified sleep apnea    uses oxygen 4 liter per minute all the time by trach    Medications:  Prescriptions Prior to Admission  Medication Sig Dispense Refill Last Dose  . acetaminophen (TYLENOL) 500 MG tablet Take 500 mg by mouth every 6 (six) hours as needed for mild pain or moderate pain.   Taking  . albuterol (PROVENTIL) (2.5 MG/3ML) 0.083% nebulizer solution Take 3 mLs (2.5 mg total) by nebulization every 4 (four) hours as needed for  wheezing or shortness of breath.   Taking  . allopurinol (ZYLOPRIM) 100 MG tablet Take 100 mg by mouth daily.    Taking  . apixaban (ELIQUIS) 5 MG TABS tablet Take 1 tablet (5 mg total) by mouth 2 (two) times daily. 60 tablet 0 Taking  . arformoterol (BROVANA) 15 MCG/2ML NEBU Take 2 mLs (15 mcg total) by nebulization 2 (two) times daily. 75 mL 5 Taking  . budesonide (PULMICORT) 0.5 MG/2ML nebulizer solution USE ONE VIAL IN NEBULIZER TWICE DAILY 75 mL 5 Taking  . COLCRYS 0.6 MG tablet Take 0.6 mg by mouth daily as needed (gout).    Taking  . dextromethorphan-guaiFENesin (MUCINEX DM) 30-600 MG 12hr tablet Take 1 tablet by mouth 2 (two) times daily. Patient takes for mucus relief   Taking  . diltiazem (CARDIZEM CD) 240 MG 24 hr capsule Take 1 capsule (240 mg total) by mouth daily. 30 capsule 0 Taking   . furosemide (LASIX) 40 MG tablet TAKE TWO TABLETS BY MOUTH IN THE MORNING AND ONE TABLET IN THE EVENING (Patient taking differently: TAKE TWO TABLETS (643m) BY MOUTH IN THE MORNING AND ONE TABLET (423m IN THE EVENING) 90 tablet 9 Taking  . metolazone (ZAROXOLYN) 2.5 MG tablet TAKE ONE TABLET BY MOUTH ONCE DAILY ON  MONDAY,  WEDNESDAY,  AND  FRIDAY  30  MINUTES  BEFORE  LASIX (Patient taking differently: TAKE ONE TABLET (2.43m60mBY MOUTH ONCE DAILY ON  MONDAY,  WEDNESDAY,  AND  FRIDAY  30  MINUTES  BEFORE  LASIX) 30 tablet 3 Taking  . nitroGLYCERIN (NITROSTAT) 0.4 MG SL tablet Place 1 tablet (0.4 mg total) under the tongue every 5 (five) minutes as needed. For chest pain 25 tablet 6 Taking   Scheduled:  . arformoterol  15 mcg Nebulization BID  . budesonide (PULMICORT) nebulizer solution  0.5 mg Nebulization BID  . chlorhexidine gluconate (MEDLINE KIT)  15 mL Mouth Rinse BID  . Chlorhexidine Gluconate Cloth  6 each Topical Daily  . fentaNYL (SUBLIMAZE) injection  50 mcg Intravenous Once  . ipratropium-albuterol  3 mL Nebulization Q6H  . mouth rinse  15 mL Mouth Rinse 10 times per day  . pantoprazole (PROTONIX) IV  40 mg Intravenous QHS  . sodium chloride flush  10-40 mL Intracatheter Q12H    Assessment: [age]u male s/p cardiac arrest on hypothermia protocol. He is on apixiban PTA for afib and pharmacy consulted to dose heparin. Last dose of apixiban is not known -Hg= 7.2 (has been 7.2-8 since 9/5), plt= 148 -heparin level= > 2.2 (effect of recent apixaban)  Goal of Therapy:  Heparin level 0.3-0.7 units/ml aPTT 66-102 seconds Monitor platelets by anticoagulation protocol: Yes   Plan:   -No heparin bolus -Begin heparin infusion at 1200 units/hr -Heparin level in 6 hours and daily wth CBC daily  AndHildred Laserharm D 04/30/2017 12:21 PM

## 2017-05-02 NOTE — Progress Notes (Signed)
PULMONARY / CRITICAL CARE MEDICINE   Name: [name] [name] MRN: [id number] DOB: 12-May-1951    ADMISSION DATE:  05/03/2017  HISTORY OF PRESENT ILLNESS:   45yoM with h/o dCHF, CAD s/p CABG, CKD stage 3, COPD/OSA s.p tracheostomy on chronic night vent, DM, HTN, A.Fib/Aflutter on eliquis, who is s/p recent admission 9/5-9/8 for new Afib with RVR and UTI. Now admitted overnight with out-of-hospital cardiac arrest (asystole/PEA), s/p ROSC after 30-5min CPR. Upon arrival to ED patient obtunded with myoclonic seizure activity.   Overnight he was started on normothermic protocol. There was concern for possible seizure. RN describes it as lip twitching. At time of my exam he is having chewing movements of his mouth/lips and has intermittent myoclonic jerks in his BUE. He was started on propofol gtt by Elink as treatment for possible seizure. EEG is being set up during time of my exam. He continues to be in shock requiring 13mcg levophed. He has slow oozing of blood from his right femoral line. He is unresponsive at time of my exam and no family is present in the room.   PAST MEDICAL HISTORY :  He  has a past medical history of Anemia; Angina; Anxiety; Arthritis; CHF (congestive heart failure) ([place]); Chronic airway obstruction, not elsewhere classified; Chronic diastolic CHF (congestive heart failure) ([place]) (10/28/2007); Chronic kidney disease; Chronic pulmonary heart disease, unspecified; Chronic renal insufficiency, stage III (moderate); Chronic respiratory failure (HCC); COPD (chronic obstructive pulmonary disease) ([place]); Coronary atherosclerosis of unspecified type of vessel, native or graft; GERD (gastroesophageal reflux disease); History of gout; Hyperlipidemia; Hypertension; Obstructive sleep apnea (10/28/2007); OSA (obstructive sleep apnea); PVD (peripheral vascular disease) ([place]) (07/13/2011); PVD (peripheral vascular disease) ([place]); S/P CABG (coronary artery bypass graft) (x 6 2000 or 2001); Shortness of  breath dyspnea; Swelling of limb; TIA (transient ischemic attack); Type 2 diabetes mellitus with vascular disease ([place]) (07/13/2011); and Unspecified sleep apnea.  PAST SURGICAL HISTORY: He  has a past surgical history that includes Coronary artery bypass graft (07/12/2000); Appendectomy; Bilateral VATS ablation; hemmorhoids; lung sx (2005); Esophagogastroduodenoscopy (08/11/2011); Colonoscopy (08/11/2011); Cataract extraction; Mitral valve replacement (04/16/2008); Cardiac catheterization (02/16/2010); Cardiac catheterization (03/29/2009); Cardiac catheterization (08/05/2004); Cardiac catheterization (06/28/2000); Abdominal Aortogram (12/03/2002); Tracheostomy tube placement (N/A, 05/23/2013); Coronary angioplasty with stent; Colonoscopy with propofol (N/A, 09/07/2014); and Esophagogastroduodenoscopy (egd) with propofol (N/A, 09/07/2014).  Allergies  Allergen Reactions  . Amlodipine Besy-Benazepril Hcl Swelling    LIPS SWELL ANGIOEDEMA  . Percocet [Oxycodone-Acetaminophen] Other (See Comments)    HALLUCINATIONS    No current facility-administered medications on file prior to encounter.    Current Outpatient Prescriptions on File Prior to Encounter  Medication Sig  . acetaminophen (TYLENOL) 500 MG tablet Take 500 mg by mouth every 6 (six) hours as needed for mild pain or moderate pain.  Marland Kitchen albuterol (PROVENTIL) (2.5 MG/3ML) 0.083% nebulizer solution Take 3 mLs (2.5 mg total) by nebulization every 4 (four) hours as needed for wheezing or shortness of breath.  . allopurinol (ZYLOPRIM) 100 MG tablet Take 100 mg by mouth daily.   Marland Kitchen apixaban (ELIQUIS) 5 MG TABS tablet Take 1 tablet (5 mg total) by mouth 2 (two) times daily.  Marland Kitchen arformoterol (BROVANA) 15 MCG/2ML NEBU Take 2 mLs (15 mcg total) by nebulization 2 (two) times daily.  . budesonide (PULMICORT) 0.5 MG/2ML nebulizer solution USE ONE VIAL IN NEBULIZER TWICE DAILY  . COLCRYS 0.6 MG tablet Take 0.6 mg by mouth daily as needed (gout).   Marland Kitchen  dextromethorphan-guaiFENesin (MUCINEX DM) 30-600 MG 12hr tablet Take 1 tablet  by mouth 2 (two) times daily. Patient takes for mucus relief  . diltiazem (CARDIZEM CD) 240 MG 24 hr capsule Take 1 capsule (240 mg total) by mouth daily.  . furosemide (LASIX) 40 MG tablet TAKE TWO TABLETS BY MOUTH IN THE MORNING AND ONE TABLET IN THE EVENING (Patient taking differently: TAKE TWO TABLETS (80mg ) BY MOUTH IN THE MORNING AND ONE TABLET (40mg ) IN THE EVENING)  . metolazone (ZAROXOLYN) 2.5 MG tablet TAKE ONE TABLET BY MOUTH ONCE DAILY ON  MONDAY,  WEDNESDAY,  AND  FRIDAY  30  MINUTES  BEFORE  LASIX (Patient taking differently: TAKE ONE TABLET (2.5mg ) BY MOUTH ONCE DAILY ON  MONDAY,  WEDNESDAY,  AND  FRIDAY  30  MINUTES  BEFORE  LASIX)  . nitroGLYCERIN (NITROSTAT) 0.4 MG SL tablet Place 1 tablet (0.4 mg total) under the tongue every 5 (five) minutes as needed. For chest pain   FAMILY HISTORY:  His indicated that his mother is deceased. He indicated that his father is deceased. He indicated that the status of his neg hx is unknown.   SOCIAL HISTORY: He  reports that he quit smoking about 18 years ago. His smoking use included Cigarettes. He has a 25.00 pack-year smoking history. He has never used smokeless tobacco. He reports that he does not drink alcohol or use drugs.  REVIEW OF SYSTEMS:   Review of Systems  Unable to perform ROS: Critical illness   VITAL SIGNS: BP (!) 109/58   Pulse 94   Temp (!) 96.8 F (36 C)   Resp (!) 24   Ht 6\' 1"  (1.854 m)   Wt 106.6 kg (235 lb)   SpO2 93%   BMI 31.00 kg/m   HEMODYNAMICS: CVP:  [18 mmHg] 18 mmHg  VENTILATOR SETTINGS: Vent Mode: PRVC FiO2 (%):  [40 %-100 %] 50 % Set Rate:  [24 bmp] 24 bmp Vt Set:  [620 mL] 620 mL PEEP:  [5 cmH20] 5 cmH20 Plateau Pressure:  [26 cmH20-37 cmH20] 37 cmH20  INTAKE / OUTPUT: I/O last 3 completed shifts: In: 1847.5 [I.V.:1847.5] Out: 425 [Urine:425]  PHYSICAL EXAMINATION: General: obese male, trach on vent,  critically ill Neuro: chewing movements of mouth and lips that worsen with mouth care, intermittent myoclonic jerks in bilateral arms, no response to sternal rub.   HEENT: 6.0 Shiley Prox XLT trach; NG tube Cardiovascular: RRR no m/r/g Lungs: CTA b/l Abdomen: Obese soft NTND Musculoskeletal: 1+ BLE edema Skin: arctic sun cooling pads in place  LABS:  BMET  Recent Labs Lab 04/29/2017 0305 04/24/2017 0314 05/07/2017 0653  NA 129* 127* 129*  K 4.2 4.2 4.2  CL 79* 79* 82*  CO2 31  --  36*  BUN 75* 81* 80*  CREATININE 2.99* 2.80* 2.88*  GLUCOSE 140* 138* 226*    Electrolytes  Recent Labs Lab 04/14/2017 0305 04/28/2017 0653  CALCIUM 8.4* 8.0*    CBC  Recent Labs Lab 04/16/2017 0305 05/01/2017 0314 05/06/2017 0653  WBC 8.5  --  10.0  HGB 7.3* 8.8* 7.2*  HCT 26.1* 26.0* 24.6*  PLT 147*  --  148*    Coag's  Recent Labs Lab 04/26/2017 0653  APTT 36  INR 1.25    Sepsis Markers  Recent Labs Lab 05/03/2017 0305 05/11/2017 0314 04/17/2017 0653  LATICACIDVEN  --  10.96* 1.5  PROCALCITON 0.10  --   --     ABG  Recent Labs Lab 05/10/2017 0312 04/23/2017 0450  PHART 7.340* 7.427  PCO2ART 76.0* 58.6*  PO2ART 389.0*  41.0*    Liver Enzymes  Recent Labs Lab 04/20/2017 0305  AST 42*  ALT 17  ALKPHOS 80  BILITOT 0.4  ALBUMIN 3.1*    Cardiac Enzymes  Recent Labs Lab 05/13/2017 0305 04/22/2017 0653  TROPONINI 0.03* 0.61*    Glucose  Recent Labs Lab 05/12/2017 0434 05/10/2017 0750  GLUCAP 180* 238*    Imaging Ct Head Wo Contrast  Result Date: 04/15/2017 CLINICAL DATA:  Encephalopathy EXAM: CT HEAD WITHOUT CONTRAST TECHNIQUE: Contiguous axial images were obtained from the base of the skull through the vertex without intravenous contrast. COMPARISON:  Brain MRI 12/05/2010 FINDINGS: Motion degraded examination. Brain: No mass lesion, intraparenchymal hemorrhage or extra-axial collection. No evidence of acute cortical infarct. Brain parenchyma and CSF-containing spaces are  normal for age. Vascular: No hyperdense vessel or unexpected calcification. Skull: Normal visualized skull base, calvarium and extracranial soft tissues. Sinuses/Orbits: No sinus fluid levels or advanced mucosal thickening. No mastoid effusion. Normal orbits. IMPRESSION: Motion degraded study without acute intracranial abnormality. Electronically Signed   By: Ulyses Jarred M.D.   On: 05/05/2017 06:41   Ct Chest Wo Contrast  Result Date: 05/03/2017 CLINICAL DATA:  Acute respiratory illness. Cardiac arrest. History of diabetes, coronary artery bypass graft, gastroesophageal reflux disease, COPD, heart failure, chronic respiratory failure, chronic pulmonary heart disease, atrial fibrillation, hypertension EXAM: CT CHEST WITHOUT CONTRAST TECHNIQUE: Multidetector CT imaging of the chest was performed following the standard protocol without IV contrast. COMPARISON:  06/02/2015 FINDINGS: Cardiovascular: Cardiac enlargement. Postoperative coronary bypass grafts. Normal caliber thoracic aorta with aortic calcification. No pericardial effusion. Mediastinum/Nodes: Esophagus is decompressed. Moderately prominent lymph nodes in the left paratracheal region measuring up to about 10 mm short axis dimension and the right pretracheal region measuring about 11 mm short axis dimension. Similar changes on previous study. These are likely reactive although nonspecific. Diffuse enlargement of the thyroid gland consistent with goiter. Tracheostomy tube in place. Lungs/Pleura: Bilateral pleural calcification with loculated fluid collection in the left costophrenic angle. Changes are similar to previous study. Small bilateral pleural effusions. Basilar atelectasis or consolidation. Diffuse patchy airspace disease throughout both lungs, more prominent in the upper lungs. This is likely due to edema but could also indicate multifocal pneumonia or aspiration. Airways appear patent. Emphysematous changes in the lung apices. No pneumothorax.  Upper Abdomen: Cholelithiasis. Vascular calcifications. No acute process otherwise identified in the visualized upper abdomen. Musculoskeletal: Degenerative changes in the spine. No destructive bone lesions. IMPRESSION: 1. Diffuse patchy airspace disease in both lungs may indicate edema, pneumonia, or aspiration. 2. Small bilateral pleural effusions with basilar atelectasis. 3. Stable cardiac enlargement. 4. Prominent mediastinal lymph nodes without change since prior study. 5. Diffuse thyroid gland enlargement consistent with coronary, unchanged. 6. Bilateral pleural calcification with loculated fluid collection on the left. This appearance is unchanged since previous study and may indicate prior infection or pleurodesis. 7. Cholelithiasis. 8. Emphysematous changes in the lungs. 9. Aortic atherosclerosis. Aortic Atherosclerosis (ICD10-I70.0) and Emphysema (ICD10-J43.9). Electronically Signed   By: Lucienne Capers M.D.   On: 04/24/2017 06:44   Dg Chest Portable 1 View  Result Date: 05/07/2017 CLINICAL DATA:  Post cardiac arrest EXAM: PORTABLE CHEST 1 VIEW COMPARISON:  04/18/2017 FINDINGS: Tracheostomy tube tip is about 4.6 cm superior to the carina. Post sternotomy changes. Cardiomegaly. Moderate diffuse interstitial and alveolar opacity left greater than right. Possible small pleural effusions. No pneumothorax. Left pleural calcification. IMPRESSION: Cardiomegaly with moderate left greater than right interstitial and alveolar opacity which may reflect pulmonary edema or diffuse infection.  Suspect that there are small pleural effusions. Electronically Signed   By: Donavan Foil M.D.   On: 04/20/2017 03:22   Dg Abd Portable 1v  Result Date: 04/22/2017 CLINICAL DATA:  Check nasogastric catheter placement EXAM: PORTABLE ABDOMEN - 1 VIEW COMPARISON:  None. FINDINGS: Nasogastric catheter is noted with the tip in the distal stomach. Scattered small and large bowel gas is noted. No obstructive changes are seen. No  acute bony abnormality is noted. IMPRESSION: Nasogastric catheter within the distal stomach. Electronically Signed   By: Inez Catalina M.D.   On: 05/13/2017 08:11   STUDIES:  Head CT (05/09/2017): Motion degraded study without acute intracranial abnormality. Chest CT w/o contrast (05/06/2017): 1. Diffuse patchy airspace disease in both lungs may indicate edema, pneumonia, or aspiration. 2. Small bilateral pleural effusions with basilar atelectasis. 3. Stable cardiac enlargement. 4. Prominent mediastinal lymph nodes without change since prior study. 5. Diffuse thyroid gland enlargement consistent with coronary, unchanged. 6. Bilateral pleural calcification with loculated fluid collection on the left. This appearance is unchanged since previous study and may indicate prior infection or pleurodesis. 7. Cholelithiasis. 8. Emphysematous changes in the lungs. 9. Aortic atherosclerosis. TTE (pending) EEG (pending)  CULTURES: Blood cultures (9/19): pending Sputum culture (ordered) Urine culture (ordered)  ANTIBIOTICS: Vancomycin and Zosyn (both started 9/19)  LINES/TUBES: #6 Shiley Proximal XLT Trach Right Femoral TLC (placed 9/19 AM) NG tube (placed 9/19 AM) 18G L-hand, 20G L-arm PIV's Right radial Aline (placed 9/19 AM)   ASSESSMENT / PLAN: 57yoM with h/o dCHF, CAD s/p CABG, CKD stage 3, COPD/OSA s.p tracheostomy on chronic night vent, DM, HTN, A.Fib/Aflutter on eliquis, who is s/p recent admission 9/5-9/8 for new Afib with RVR and UTI. Now admitted with out-of-hospital cardiac arrest (asystole/PEA), s/p ROSC after 30-20min CPR. Upon arrival to ED patient obtunded with myoclonic seizure activity.   PULMONARY 1. Acute Hypoxic and Chronic Hypercapneic Respiratory failure; Aspiration pneumonia; COPD; OSA - continue mechanical ventilation; decreased FIO2 from 50 to 40% - repeat CXR and ABG in AM - obtain sputum culture; continue Vanc and Zosyn - increase pulmicort from 0.25 to 0.5 dose BID;  continue brovana bid and duonebs QID. Not wheezing on exam, therefore no need for systemic steroids at this time.  Lurline Idol care  CARDIOVASCULAR 1. Cardiac arrest; CAD; Hx HTN: - out of hospital asystole/PEA arrest, s/p ROSC after 30-40 minutes - undergoing normothermic (36) protocol - Head CT showed no acute issues - TTE pending  2. Afib/Aflutter: recent diagnosis - was on Eliquis at home; start Heparin gtt now instead.  - is currently rate-controlled   3. Shock: presumed septic but could be cardiogenic; TTE pending.   RENAL 1. AKI-on-CKD: - creatinine 2.88 down from 2.99 on admit, up from baseline of 2.2-2.3 - continue foley catheter and IVF's - monitor UOP (50cc/hr currently); avoid nephrotoxic agents   GASTROINTESTINAL No active issues   HEMATOLOGIC 1. Anemia:  - has hx anemia of unclear etiology; Now Hgb 7.2 but baseline appears to be 7's-9's.  - No bleeding noted except for slow ooze from right femoral site (to which will apply thrombin pad); continue to monitor Hgb closely   INFECTIOUS 1. Septic shock from Aspiration pneumonia and UTI: - Chest CT shows multifocal infiltrates (edema vs pna) but procalcitonin is only 0.10. Lactate improved from 10.9 to 1.5 with IVF's; received 3L IVF per [facility]  - continue NS at 125cc/hr - Pan-cultured; follow-up results - Continue Vanc and Zosyn  ENDOCRINE 1. DM: - insulin gtt ordered  per hypothermia protocol - NPO  NEUROLOGIC 1. Anoxic encephalopathy; Myoclonic Jerks; Questionable seizure: - continue propofol gtt for now; will await Neuro rec's regarding if they want to start [place] - EEG pending - Head CT on admission no acute process   60 minutes critical care time   Vernie Murders, MD Pulmonary and [place] Pager: [phone]  04/22/2017, 10:58 AM

## 2017-05-02 NOTE — Progress Notes (Signed)
[place] for heparin  Indication: atrial fibrillation  Allergies  Allergen Reactions  . Amlodipine Besy-Benazepril Hcl Swelling    LIPS SWELL ANGIOEDEMA  . Percocet [Oxycodone-Acetaminophen] Other (See Comments)    HALLUCINATIONS    Patient Measurements: Height: 6\' 1"  (185.4 cm) Weight: 235 lb (106.6 kg) IBW/kg (Calculated) : 79.9 Heparin Dosing Weight: 102kg  Vital Signs: Temp: 96.6 F (35.9 C) (09/19 1700) Temp Source: Core (Comment) (09/19 1600) BP: 124/64 (09/19 1700) Pulse Rate: 89 (09/19 1700)  Labs:  Recent Labs  04/21/2017 0305 04/14/2017 0314 05/01/2017 0653 05/12/2017 1045 05/10/2017 1622 05/07/2017 1634  HGB 7.3* 8.8* 7.2*  --  6.5*  --   HCT 26.1* 26.0* 24.6*  --  21.9*  --   PLT 147*  --  148*  --  109*  --   APTT  --   --  36  --   --  71*  LABPROT  --   --  15.6*  --   --  16.2*  INR  --   --  1.25  --   --  1.31  HEPARINUNFRC  --   --   --  >2.20*  --   --   CREATININE 2.99* 2.80* 2.88*  --   --   --   TROPONINI 0.03*  --  0.61*  --   --   --     Estimated Creatinine Clearance: 32.8 mL/min (A) (by C-G formula based on SCr of 2.88 mg/dL (H)).  Assessment: 55 you male s/p cardiac arrest on hypothermia protocol. He is on apixiban PTA for afib and pharmacy consulted to dose heparin. Last dose of apixiban is not known  Initial aPTT drawn ~2h too early- in range at 71 seconds- except level to increase as patient is being cooled.  Heparin now turned off as patient is having oozing from CVL and is to get a unit of blood. Spoke with RN Janett Billow about restarting heparin at same rate without bolus once bleeding slows as per Dr. Levada Schilling recommendations.  Jessica to pass off to night shift RN to call this pharmacist as heparin will likely start after 1900.   Goal of Therapy:  Heparin level 0.3-0.7 units/ml aPTT 66-102 seconds Monitor platelets by anticoagulation protocol: Yes   Plan:   -No heparin bolus. Restart heparin at  1200 units/hr when oozing slows and transfusion completes -aPTT 6h after heparin restarts -daily heparin level, aPTT and CBC  Destane Speas D. Zyen Triggs, PharmD, BCPS Clinical Pharmacist [phone] until 2300 04/21/2017 5:32 PM

## 2017-05-02 NOTE — Procedures (Addendum)
Central Venous Catheter Insertion Procedure Note MINAS BONSER [id number] September 16, 1950  Procedure: Insertion of Central Venous Catheter Indications: Assessment of intravascular volume, Drug and/or fluid administration and Frequent blood sampling  Procedure Details Consent: Unable to obtain consent because of emergent medical necessity. Time Out: Verified patient identification, verified procedure, site/side was marked, verified correct patient position, special equipment/implants available, medications/allergies/relevent history reviewed, required imaging and test results available.  Performed  Maximum sterile technique was used including antiseptics, cap, gloves, gown, hand hygiene, mask and sheet. Skin prep: Chlorhexidine; local anesthetic administered A antimicrobial bonded/coated triple lumen catheter was placed in the right femoral vein due to multiple attempts, no other available access using the Seldinger technique.  Evaluation Blood flow good Complications: No apparent complications Patient did tolerate procedure well. Chest X-ray ordered to verify placement.  CXR: pending.  Hayden Pedro, AGACNP-BC [facility]  Pgr: [phone]  PCCM Pgr: [phone]   I, Dr Seward Carol Agree with the above note by NP [place] Pt required central venous catheter for drug administration, frequent blood sampling, and to assess intravascular volume.   Consent was not obtained due to emergent circumstances but family was informed after, Time out performed and maximal barrier precautions with sterile technique used for line insertion. No complications occurred.  Dr. Seward Carol Pulmonary Critical Care Medicine Locums

## 2017-05-02 NOTE — Consult Note (Signed)
Admission H&P Referring physician: Dr. Titus Mould Chief Complaint: unresponsive following cardiac arrest.  HPI: [name] [name] is an 66 y.o. male with a history of atrial fibrillation on Eliquis, diabetes mellitus TIA, peripheral vascular disease, CABG, obstructive sleep apnea, hypertension, COPD with chronic respiratory failure, brought to the ED after being found unresponsive at home. He was found to be in asystole/PEA by EMS and resuscitation was initiated and continued for 30-40 minutes. Patient was obtunded and exhibited myoclonic activity on arrival in the ED. He has a tracheostomy and was placed on mechanical ventilation. He is currently on pressor agents and has remained unresponsive.  Past Medical History:  Diagnosis Date  . Anemia   . Angina   . Anxiety   . Arthritis    gout  . CHF (congestive heart failure) ([place])   . Chronic airway obstruction, not elsewhere classified   . Chronic diastolic CHF (congestive heart failure) ([place]) 10/28/2007   Hospitalized with diastolic CHF controlled with diuresis and BiPAP 2D April 2012 EF 55-60 with severe LVH and grade 1 diastolic dysfunction    . Chronic kidney disease    sees dr Louanna Raw every 6 months  . Chronic pulmonary heart disease, unspecified   . Chronic renal insufficiency, stage III (moderate)   . Chronic respiratory failure ([place])   . COPD (chronic obstructive pulmonary disease) ([place])   . Coronary atherosclerosis of unspecified type of vessel, native or graft   . GERD (gastroesophageal reflux disease)   . History of gout   . Hyperlipidemia   . Hypertension   . Obstructive sleep apnea 10/28/2007  . OSA (obstructive sleep apnea)   . PVD (peripheral vascular disease) ([place]) 07/13/2011   S/P Lt SFA PTA Sept 2009, known occluded Rt SFA   . PVD (peripheral vascular disease) ([place])   . S/P CABG (coronary artery bypass graft) x 6 2000 or 2001   Nuc, 09/17/2012 - Significant ST abnormalities that are suggestive of ischemia, low risk  nuclear study  . Shortness of breath dyspnea    with activity  . Swelling of limb    Venous Duplex, 09/03/2012 - no evidence of thrombus or thrombophelitis  . TIA (transient ischemic attack)    2D Echo, 12/05/2010 - EF 55-60%, severe LVH  . Type 2 diabetes mellitus with vascular disease ([place]) 07/13/2011   Type 2   . Unspecified sleep apnea    uses oxygen 4 liter per minute all the time by trach    Past Surgical History:  Procedure Laterality Date  . Abdominal Aortogram  12/03/2002   70% stenosis at the origin of the profunda femoris, 75% mid superficial femoral artery stenosis  . APPENDECTOMY    . BILATERAL VATS ABLATION    . CARDIAC CATHETERIZATION  02/16/2010   RCA-80% stenosis stented with a 3x12 Taxus ION DES resulting in reduction of a total occlusion to 0%  . CARDIAC CATHETERIZATION  03/29/2009   Ostial/Proximal PDA-SVG-75% stenosis stented with a 3.0x23 Xience V DES reduction resulting in 75% stenosis to 0%  . CARDIAC CATHETERIZATION  08/05/2004   Circumflex OM stented with a 2.5x13 Cypher stent resulting in reduction of 60% to 0%  . CARDIAC CATHETERIZATION  06/28/2000   Moderate left main and severe three-vessel disease, consider CABG  . CATARACT EXTRACTION    . COLONOSCOPY  08/11/2011   Procedure: COLONOSCOPY;  Surgeon: Missy Sabins, MD;  Location: [facility];  Service: Endoscopy;  Laterality: N/A;  . COLONOSCOPY WITH PROPOFOL N/A 09/07/2014   Procedure: COLONOSCOPY WITH PROPOFOL;  Surgeon: Missy Sabins, MD;  Location: [facility];  Service: Endoscopy;  Laterality: N/A;  . CORONARY ANGIOPLASTY WITH STENT PLACEMENT     x 2  . CORONARY ARTERY BYPASS GRAFT  07/12/2000   x6,  . ESOPHAGOGASTRODUODENOSCOPY  08/11/2011   Procedure: ESOPHAGOGASTRODUODENOSCOPY (EGD);  Surgeon: Missy Sabins, MD;  Location: [facility] ENDOSCOPY;  Service: Endoscopy;  Laterality: N/A;  . ESOPHAGOGASTRODUODENOSCOPY (EGD) WITH PROPOFOL N/A 09/07/2014   Procedure: ESOPHAGOGASTRODUODENOSCOPY (EGD) WITH PROPOFOL;   Surgeon: Missy Sabins, MD;  Location: [facility] ENDOSCOPY;  Service: Endoscopy;  Laterality: N/A;  . hemmorhoids    . lung sx  2005   Growth on outside of right lung  . PTCA  04/16/2008   Left SFA-60% stenosis stented with a 7x6 Nitinolself-expanding stent resulting in reduction of 60% mid segmental L SFA stenosis to 0% residual  . TRACHEOSTOMY TUBE PLACEMENT N/A 05/23/2013   Procedure: TRACHEOSTOMY;  Surgeon: Melida Quitter, MD;  Location: [facility] OR;  Service: ENT;  Laterality: N/A;    Family History  Problem Relation Age of Onset  . Diabetes Sister   . Hypertension Sister   . Cancer Sister   . Heart attack Neg Hx   . Stroke Neg Hx    Social History:  reports that he quit smoking about 18 years ago. His smoking use included Cigarettes. He has a 25.00 pack-year smoking history. He has never used smokeless tobacco. He reports that he does not drink alcohol or use drugs.  Allergies:  Allergies  Allergen Reactions  . Amlodipine Besy-Benazepril Hcl Swelling    LIPS SWELL ANGIOEDEMA  . Percocet [Oxycodone-Acetaminophen] Other (See Comments)    HALLUCINATIONS    Medications: Outpatient medication list was reviewed by me.  ROS: Unobtainable due to patient's unresponsive state.  Physical Examination: Blood pressure 109/62, pulse 83, temperature (!) 97.3 F (36.3 C), temperature source Temporal, resp. rate (!) 23, height 6' 1"  (1.854 m), weight 106.6 kg (235 lb), SpO2 100 %.  HEENT-  Normocephalic, no lesions, without obvious abnormality.  Normal external eye and conjunctiva.  Normal TM's bilaterally.  Normal auditory canals and external ears. Normal external nose, mucus membranes and septum.  Normal pharynx. Neck supple with no masses, nodes, nodules or enlargement. Cardiovascular - irregularly irregular rhythm, S1, S2 normal and no S3 or S4 Lungs - chest clear, no wheezing, rales, normal symmetric air entry, Heart exam - S1, S2 normal, no murmur, no gallop, rate regular Abdomen - soft,  non-tender; bowel sounds normal; no masses,  no organomegaly Extremities - edema bilaterally of legs and feet  Neurologic Examination: Patient was on mechanical ventilation via tracheostomy. He had spontaneous respirations as well. He had frequent generalized myoclonic type movements with decorticate posturing which appeared to be stimulation induced. He was unresponsive to external stimuli otherwise. Pupils were equal at 3 mm and did not react to light. Eyes were deviated upward. Extraocular movements were absent with oculocephalic maneuvers. Motor exam showed increased tone throughout which was symmetrical. There was no abnormal posturing at rest. Deep tendon reflexes were 1+ and symmetrical. Plantar responses were mute.  Results for orders placed or performed during the hospital encounter of 05/03/2017 (from the past 48 hour(s))  CBC with Differential     Status: Abnormal   Collection Time: 04/21/2017  3:05 AM  Result Value Ref Range   WBC 8.5 4.0 - 10.5 K/uL   RBC 2.76 (L) 4.22 - 5.81 MIL/uL   Hemoglobin 7.3 (L) 13.0 - 17.0 g/dL   HCT 26.1 (L) 39.0 -  52.0 %   MCV 94.6 78.0 - 100.0 fL   MCH 26.4 26.0 - 34.0 pg   MCHC 28.0 (L) 30.0 - 36.0 g/dL   RDW 14.8 11.5 - 15.5 %   Platelets 147 (L) 150 - 400 K/uL   Neutrophils Relative % 63 %   Neutro Abs 5.4 1.7 - 7.7 K/uL   Lymphocytes Relative 30 %   Lymphs Abs 2.6 0.7 - 4.0 K/uL   Monocytes Relative 6 %   Monocytes Absolute 0.5 0.1 - 1.0 K/uL   Eosinophils Relative 1 %   Eosinophils Absolute 0.1 0.0 - 0.7 K/uL   Basophils Relative 0 %   Basophils Absolute 0.0 0.0 - 0.1 K/uL  Comprehensive metabolic panel     Status: Abnormal   Collection Time: 04/15/2017  3:05 AM  Result Value Ref Range   Sodium 129 (L) 135 - 145 mmol/L   Potassium 4.2 3.5 - 5.1 mmol/L   Chloride 79 (L) 101 - 111 mmol/L   CO2 31 22 - 32 mmol/L   Glucose, Bld 140 (H) 65 - 99 mg/dL   BUN 75 (H) 6 - 20 mg/dL   Creatinine, Ser 2.99 (H) 0.61 - 1.24 mg/dL   Calcium 8.4 (L)  8.9 - 10.3 mg/dL   Total Protein 6.5 6.5 - 8.1 g/dL   Albumin 3.1 (L) 3.5 - 5.0 g/dL   AST 42 (H) 15 - 41 U/L   ALT 17 17 - 63 U/L   Alkaline Phosphatase 80 38 - 126 U/L   Total Bilirubin 0.4 0.3 - 1.2 mg/dL   GFR calc non Af Amer 20 (L) >60 mL/min   GFR calc Af Amer 24 (L) >60 mL/min    Comment: (NOTE) The eGFR has been calculated using the CKD EPI equation. This calculation has not been validated in all clinical situations. eGFR's persistently <60 mL/min signify possible Chronic Kidney Disease.    Anion gap 19 (H) 5 - 15  Procalcitonin - Baseline     Status: None   Collection Time: 05/08/2017  3:05 AM  Result Value Ref Range   Procalcitonin 0.10 ng/mL    Comment:        Interpretation: PCT (Procalcitonin) <= 0.5 ng/mL: Systemic infection (sepsis) is not likely. Local bacterial infection is possible. (NOTE)         ICU PCT Algorithm               Non ICU PCT Algorithm    ----------------------------     ------------------------------         PCT < 0.25 ng/mL                 PCT < 0.1 ng/mL     Stopping of antibiotics            Stopping of antibiotics       strongly encouraged.               strongly encouraged.    ----------------------------     ------------------------------       PCT level decrease by               PCT < 0.25 ng/mL       >= 80% from peak PCT       OR PCT 0.25 - 0.5 ng/mL          Stopping of antibiotics  encouraged.     Stopping of antibiotics           encouraged.    ----------------------------     ------------------------------       PCT level decrease by              PCT >= 0.25 ng/mL       < 80% from peak PCT        AND PCT >= 0.5 ng/mL            Continuin g antibiotics                                              encouraged.       Continuing antibiotics            encouraged.    ----------------------------     ------------------------------     PCT level increase compared          PCT > 0.5 ng/mL          with peak PCT AND          PCT >= 0.5 ng/mL             Escalation of antibiotics                                          strongly encouraged.      Escalation of antibiotics        strongly encouraged.   Troponin I (q 6hr x 3)     Status: Abnormal   Collection Time: 05/11/2017  3:05 AM  Result Value Ref Range   Troponin I 0.03 (HH) <0.03 ng/mL    Comment: CRITICAL RESULT CALLED TO, READ BACK BY AND VERIFIED WITH: MOON K,RN 04/29/2017 0410 WAYK   Type and screen [place]     Status: None   Collection Time: 04/17/2017  3:05 AM  Result Value Ref Range   ABO/RH(D) O POS    Antibody Screen NEG    Sample Expiration 05/05/2017   I-Stat Troponin, ED (not at [facility])     Status: None   Collection Time: 05/12/2017  3:12 AM  Result Value Ref Range   Troponin i, poc 0.03 0.00 - 0.08 ng/mL   Comment 3            Comment: Due to the release kinetics of cTnI, a negative result within the first hours of the onset of symptoms does not rule out myocardial infarction with certainty. If myocardial infarction is still suspected, repeat the test at appropriate intervals.   I-Stat Arterial Blood Gas, ED - (order at [facility] and [facility] only)     Status: Abnormal   Collection Time: 04/24/2017  3:12 AM  Result Value Ref Range   pH, Arterial 7.340 (L) 7.350 - 7.450   pCO2 arterial 76.0 (HH) 32.0 - 48.0 mmHg   pO2, Arterial 389.0 (H) 83.0 - 108.0 mmHg   Bicarbonate 41.1 (H) 20.0 - 28.0 mmol/L   TCO2 43 (H) 22 - 32 mmol/L   O2 Saturation 100.0 %   Acid-Base Excess 13.0 (H) 0.0 - 2.0 mmol/L   Patient temperature 98.0 F    Collection site RADIAL, ALLEN'S TEST ACCEPTABLE    Drawn by RT  Sample type ARTERIAL    Comment NOTIFIED PHYSICIAN   I-Stat CG4 Lactic Acid, ED     Status: Abnormal   Collection Time: 05/05/2017  3:14 AM  Result Value Ref Range   Lactic Acid, Venous 10.96 (HH) 0.5 - 1.9 mmol/L   Comment NOTIFIED PHYSICIAN   I-Stat Chem 8, ED     Status: Abnormal   Collection Time: 04/18/2017   3:14 AM  Result Value Ref Range   Sodium 127 (L) 135 - 145 mmol/L   Potassium 4.2 3.5 - 5.1 mmol/L   Chloride 79 (L) 101 - 111 mmol/L   BUN 81 (H) 6 - 20 mg/dL   Creatinine, Ser 2.80 (H) 0.61 - 1.24 mg/dL   Glucose, Bld 138 (H) 65 - 99 mg/dL   Calcium, Ion 0.96 (L) 1.15 - 1.40 mmol/L   TCO2 33 (H) 22 - 32 mmol/L   Hemoglobin 8.8 (L) 13.0 - 17.0 g/dL   HCT 26.0 (L) 39.0 - 52.0 %  Urinalysis, Routine w reflex microscopic     Status: Abnormal   Collection Time: 04/30/2017  3:15 AM  Result Value Ref Range   Color, Urine YELLOW YELLOW   APPearance HAZY (A) CLEAR   Specific Gravity, Urine 1.008 1.005 - 1.030   pH 7.0 5.0 - 8.0   Glucose, UA NEGATIVE NEGATIVE mg/dL   Hgb urine dipstick SMALL (A) NEGATIVE   Bilirubin Urine NEGATIVE NEGATIVE   Ketones, ur NEGATIVE NEGATIVE mg/dL   Protein, ur 30 (A) NEGATIVE mg/dL   Nitrite NEGATIVE NEGATIVE   Leukocytes, UA LARGE (A) NEGATIVE   RBC / HPF 6-30 0 - 5 RBC/hpf   WBC, UA TOO NUMEROUS TO COUNT 0 - 5 WBC/hpf   Bacteria, UA RARE (A) NONE SEEN   Squamous Epithelial / LPF 0-5 (A) NONE SEEN   WBC Clumps PRESENT    Mucus PRESENT    Sperm, UA PRESENT   CBG monitoring, ED     Status: Abnormal   Collection Time: 05/12/2017  4:34 AM  Result Value Ref Range   Glucose-Capillary 180 (H) 65 - 99 mg/dL  I-Stat arterial blood gas, ED     Status: Abnormal   Collection Time: 04/15/2017  4:50 AM  Result Value Ref Range   pH, Arterial 7.427 7.350 - 7.450   pCO2 arterial 58.6 (H) 32.0 - 48.0 mmHg   pO2, Arterial 41.0 (L) 83.0 - 108.0 mmHg   Bicarbonate 38.8 (H) 20.0 - 28.0 mmol/L   TCO2 41 (H) 22 - 32 mmol/L   O2 Saturation 77.0 %   Acid-Base Excess 12.0 (H) 0.0 - 2.0 mmol/L   Patient temperature 98.0 F    Collection site RADIAL, ALLEN'S TEST ACCEPTABLE    Drawn by RT    Sample type ARTERIAL    Dg Chest Portable 1 View  Result Date: 05/05/2017 CLINICAL DATA:  Post cardiac arrest EXAM: PORTABLE CHEST 1 VIEW COMPARISON:  04/18/2017 FINDINGS: Tracheostomy  tube tip is about 4.6 cm superior to the carina. Post sternotomy changes. Cardiomegaly. Moderate diffuse interstitial and alveolar opacity left greater than right. Possible small pleural effusions. No pneumothorax. Left pleural calcification. IMPRESSION: Cardiomegaly with moderate left greater than right interstitial and alveolar opacity which may reflect pulmonary edema or diffuse infection. Suspect that there are small pleural effusions. Electronically Signed   By: Donavan Foil M.D.   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  Recommendations: 1. Depacon 1000 mg IV loading dose, followed by 500 mg every 12 hours for management of myoclonus and possible seizure activity 2. EEG to rule out seizure activity as well as assess severity of encephalopathy 3. CT scan of the head without contrast 4. MRI of the brain without contrast when feasible  This patient is critically ill and at significant risk of neurological worsening or death, and care requires constant monitoring of vital signs, hemodynamics,respiratory and cardiac monitoring, neurological assessment, discussion with family, other specialists and medical decision making of high complexity. Total critical care time was 50 minutes.  C.R. Nicole Kindred, MD [facility]  [phone]  05/11/2017, 5:35 AM

## 2017-05-03 ENCOUNTER — Inpatient Hospital Stay (HOSPITAL_COMMUNITY): Payer: Medicare Other

## 2017-05-03 DIAGNOSIS — J9601 Acute respiratory failure with hypoxia: Secondary | ICD-10-CM

## 2017-05-03 DIAGNOSIS — J9602 Acute respiratory failure with hypercapnia: Secondary | ICD-10-CM

## 2017-05-03 LAB — CBC
HEMATOCRIT: 25.4 % — AB (ref 39.0–52.0)
Hemoglobin: 7.7 g/dL — ABNORMAL LOW (ref 13.0–17.0)
MCH: 27.2 pg (ref 26.0–34.0)
MCHC: 30.3 g/dL (ref 30.0–36.0)
MCV: 89.8 fL (ref 78.0–100.0)
PLATELETS: 137 10*3/uL — AB (ref 150–400)
RBC: 2.83 MIL/uL — ABNORMAL LOW (ref 4.22–5.81)
RDW: 15.7 % — AB (ref 11.5–15.5)
WBC: 10.2 10*3/uL (ref 4.0–10.5)

## 2017-05-03 LAB — APTT: aPTT: 46 seconds — ABNORMAL HIGH (ref 24–36)

## 2017-05-03 LAB — BASIC METABOLIC PANEL
Anion gap: 11 (ref 5–15)
BUN: 77 mg/dL — ABNORMAL HIGH (ref 6–20)
CHLORIDE: 86 mmol/L — AB (ref 101–111)
CO2: 33 mmol/L — ABNORMAL HIGH (ref 22–32)
Calcium: 8.1 mg/dL — ABNORMAL LOW (ref 8.9–10.3)
Creatinine, Ser: 2.91 mg/dL — ABNORMAL HIGH (ref 0.61–1.24)
GFR calc non Af Amer: 21 mL/min — ABNORMAL LOW (ref >60)
GFR, EST AFRICAN AMERICAN: 25 mL/min — AB (ref >60)
Glucose, Bld: 66 mg/dL (ref 65–99)
POTASSIUM: 3.1 mmol/L — AB (ref 3.5–5.1)
SODIUM: 130 mmol/L — AB (ref 135–145)

## 2017-05-03 LAB — PROCALCITONIN: Procalcitonin: 19.75 ng/mL

## 2017-05-03 LAB — POCT I-STAT 3, ART BLOOD GAS (G3+)
ACID-BASE EXCESS: 15 mmol/L — AB (ref 0.0–2.0)
Bicarbonate: 36.8 mmol/L — ABNORMAL HIGH (ref 20.0–28.0)
O2 SAT: 99 %
TCO2: 38 mmol/L — ABNORMAL HIGH (ref 22–32)
pCO2 arterial: 33.4 mmHg (ref 32.0–48.0)
pH, Arterial: 7.647 (ref 7.350–7.450)
pO2, Arterial: 127 mmHg — ABNORMAL HIGH (ref 83.0–108.0)

## 2017-05-03 LAB — GLUCOSE, CAPILLARY
GLUCOSE-CAPILLARY: 92 mg/dL (ref 65–99)
GLUCOSE-CAPILLARY: 45 mg/dL — AB (ref 65–99)
GLUCOSE-CAPILLARY: 71 mg/dL (ref 65–99)
GLUCOSE-CAPILLARY: 133 mg/dL — AB (ref 65–99)
Glucose-Capillary: 20 mg/dL — CL (ref 65–99)
Glucose-Capillary: 107 mg/dL — ABNORMAL HIGH (ref 65–99)
Glucose-Capillary: 61 mg/dL — ABNORMAL LOW (ref 65–99)
Glucose-Capillary: 73 mg/dL (ref 65–99)
Glucose-Capillary: 157 mg/dL — ABNORMAL HIGH (ref 65–99)
Glucose-Capillary: 123 mg/dL — ABNORMAL HIGH (ref 65–99)

## 2017-05-03 LAB — PHOSPHORUS: PHOSPHORUS: 1.5 mg/dL — AB (ref 2.5–4.6)

## 2017-05-03 LAB — BLOOD CULTURE ID PANEL (REFLEXED)
Acinetobacter baumannii: NOT DETECTED
CANDIDA PARAPSILOSIS: NOT DETECTED
CANDIDA TROPICALIS: NOT DETECTED
CARBAPENEM RESISTANCE: NOT DETECTED
Candida albicans: NOT DETECTED
Candida glabrata: NOT DETECTED
Candida krusei: NOT DETECTED
ENTEROCOCCUS SPECIES: NOT DETECTED
Enterobacter cloacae complex: NOT DETECTED
Enterobacteriaceae species: NOT DETECTED
Escherichia coli: NOT DETECTED
Haemophilus influenzae: NOT DETECTED
KLEBSIELLA PNEUMONIAE: NOT DETECTED
Klebsiella oxytoca: NOT DETECTED
LISTERIA MONOCYTOGENES: NOT DETECTED
Methicillin resistance: DETECTED — AB
Neisseria meningitidis: NOT DETECTED
PROTEUS SPECIES: NOT DETECTED
Pseudomonas aeruginosa: NOT DETECTED
SERRATIA MARCESCENS: NOT DETECTED
STAPHYLOCOCCUS AUREUS BCID: NOT DETECTED
STAPHYLOCOCCUS SPECIES: DETECTED — AB
STREPTOCOCCUS PNEUMONIAE: NOT DETECTED
Streptococcus agalactiae: NOT DETECTED
Streptococcus pyogenes: NOT DETECTED
Streptococcus species: NOT DETECTED
VANCOMYCIN RESISTANCE: NOT DETECTED

## 2017-05-03 LAB — MAGNESIUM: MAGNESIUM: 1.5 mg/dL — AB (ref 1.7–2.4)

## 2017-05-03 LAB — ECHOCARDIOGRAM COMPLETE
HEIGHTINCHES: 73 in
WEIGHTICAEL: 3760 [oz_av]

## 2017-05-03 LAB — URINE CULTURE: CULTURE: NO GROWTH

## 2017-05-03 LAB — TROPONIN I: TROPONIN I: 0.61 ng/mL — AB (ref <0.03)

## 2017-05-03 LAB — FIBRINOGEN: Fibrinogen: 551 mg/dL — ABNORMAL HIGH (ref 210–475)

## 2017-05-03 MED ORDER — DEXTROSE 50 % IV SOLN
50.0000 mL | Freq: Once | INTRAVENOUS | Status: AC
  Administered 2017-05-03: 50 mL via INTRAVENOUS

## 2017-05-03 MED ORDER — INSULIN ASPART 100 UNIT/ML ~~LOC~~ SOLN
1.0000 [IU] | SUBCUTANEOUS | Status: DC
  Administered 2017-05-03: 1 [IU] via SUBCUTANEOUS
  Administered 2017-05-03: 2 [IU] via SUBCUTANEOUS
  Administered 2017-05-03 – 2017-05-04 (×2): 1 [IU] via SUBCUTANEOUS
  Administered 2017-05-04: 2 [IU] via SUBCUTANEOUS

## 2017-05-03 MED ORDER — DEXTROSE-NACL 5-0.9 % IV SOLN
INTRAVENOUS | Status: DC
  Administered 2017-05-03 (×2): via INTRAVENOUS
  Administered 2017-05-04 (×2): 125 mL/h via INTRAVENOUS

## 2017-05-03 MED ORDER — VITAL HIGH PROTEIN PO LIQD: 1000.0000 mL | ORAL | Status: DC

## 2017-05-03 MED ORDER — PRO-STAT SUGAR FREE PO LIQD
60.0000 mL | Freq: Three times a day (TID) | ORAL | Status: DC
  Administered 2017-05-03 – 2017-05-04 (×4): 60 mL
  Filled 2017-05-03 (×4): qty 60

## 2017-05-03 MED ORDER — SODIUM PHOSPHATES 45 MMOLE/15ML IV SOLN
30.0000 mmol | Freq: Once | INTRAVENOUS | Status: AC
  Administered 2017-05-03: 30 mmol via INTRAVENOUS
  Filled 2017-05-03: qty 10

## 2017-05-03 MED ORDER — DEXTROSE 50 % IV SOLN
INTRAVENOUS | Status: AC
  Filled 2017-05-03: qty 50

## 2017-05-03 MED ORDER — MAGNESIUM SULFATE 4 GM/100ML IV SOLN: 4.0000 g | Freq: Once | INTRAVENOUS | Status: DC

## 2017-05-03 MED ORDER — PRO-STAT SUGAR FREE PO LIQD: 30.0000 mL | Freq: Two times a day (BID) | ORAL | Status: DC

## 2017-05-03 MED ORDER — POTASSIUM CHLORIDE 10 MEQ/50ML IV SOLN
10.0000 meq | INTRAVENOUS | Status: AC
  Administered 2017-05-03 (×4): 10 meq via INTRAVENOUS
  Filled 2017-05-03 (×4): qty 50

## 2017-05-03 NOTE — Progress Notes (Signed)
PHARMACY - PHYSICIAN COMMUNICATION CRITICAL VALUE ALERT - BLOOD CULTURE IDENTIFICATION (BCID)  Results for orders placed or performed during the hospital encounter of 05/01/2017  Blood Culture ID Panel (Reflexed) (Collected: 05/01/2017  3:05 AM)  Result Value Ref Range   Enterococcus species NOT DETECTED NOT DETECTED   Vancomycin resistance NOT DETECTED NOT DETECTED   Listeria monocytogenes NOT DETECTED NOT DETECTED   Staphylococcus species DETECTED (A) NOT DETECTED   Staphylococcus aureus NOT DETECTED NOT DETECTED   Methicillin resistance DETECTED (A) NOT DETECTED   Streptococcus species NOT DETECTED NOT DETECTED   Streptococcus agalactiae NOT DETECTED NOT DETECTED   Streptococcus pneumoniae NOT DETECTED NOT DETECTED   Streptococcus pyogenes NOT DETECTED NOT DETECTED   Acinetobacter baumannii NOT DETECTED NOT DETECTED   Enterobacteriaceae species NOT DETECTED NOT DETECTED   Enterobacter cloacae complex NOT DETECTED NOT DETECTED   Escherichia coli NOT DETECTED NOT DETECTED   Klebsiella oxytoca NOT DETECTED NOT DETECTED   Klebsiella pneumoniae NOT DETECTED NOT DETECTED   Proteus species NOT DETECTED NOT DETECTED   Serratia marcescens NOT DETECTED NOT DETECTED   Carbapenem resistance NOT DETECTED NOT DETECTED   Haemophilus influenzae NOT DETECTED NOT DETECTED   Neisseria meningitidis NOT DETECTED NOT DETECTED   Pseudomonas aeruginosa NOT DETECTED NOT DETECTED   Candida albicans NOT DETECTED NOT DETECTED   Candida glabrata NOT DETECTED NOT DETECTED   Candida krusei NOT DETECTED NOT DETECTED   Candida parapsilosis NOT DETECTED NOT DETECTED   Candida tropicalis NOT DETECTED NOT DETECTED    Name of physician (or Provider) Contacted: [place]  Changes to prescribed antibiotics required: None - likely contaminant, repeat blood cultures ordered. Can likely discontinue vancomycin.  Arrie Senate, PharmD PGY-2 Cardiology Pharmacy Resident Pager: [phone] 05/03/2017

## 2017-05-03 NOTE — Progress Notes (Signed)
Hypoglycemic Event  CBG:61  Treatment: D50 IV 50 mL  Symptoms: None  Follow-up CBG: Time:06:20 CBG Result:107  Possible Reasons for Event: Inadequate meal intake  Comments/MD notified:E-Link MD made aware of events    Naftoli Penny E [employer]

## 2017-05-03 NOTE — Progress Notes (Signed)
Initial Nutrition Assessment  DOCUMENTATION CODES:   Obesity unspecified  INTERVENTION:    Vital High Protein at goal rate of 35 ml/h (840 ml per day) and Prostat 60 ml TID   TF regimen to provide 1440 kcals, 163 gm protein, 702 ml free water daily  NUTRITION DIAGNOSIS:   Inadequate oral intake related to inability to eat as evidenced by NPO status  GOAL:   Provide needs based on ASPEN/SCCM guidelines  MONITOR:   Vent status, TF tolerance, Labs, Weight trends, Skin, I & O's  REASON FOR ASSESSMENT:   Consult Enteral/tube feeding initiation and management  ASSESSMENT:   66 yo Male with PMH of CHF, CAD s/p CABG, CKD stage 3, COPD/OSA s/p tracheostomy on chronic night vent, DM, HTN, A.Fib/Aflutter on eliquis, who is s/p recent admission 9/5-9/8 for new Afib with RVR and UTI. Admitted with out-of-hospital cardiac arrest. Upon arrival to ED patient obtunded with myoclonic seizure activity.    Patient is currently on ventilator support via trach MV: 8.5 L/min Temp (24hrs), Avg:96.9 F (36.1 C), Min:96.6 F (35.9 C), Max:98.6 F (37 C)  NGT in place  Pt with acute hypoxic respiratory failure, full vent support. Myoclonic jerks vs questionable seizure. Neurology following. Vital High Protein ordered per Adult Tube Feeding Protocol.  Labs reviewed. Na 130 (L). K 3.1 (L). Mg 1.5 (L). P 1.5 (L). Medications reviewed and include Depacon, ABX. CBG's [id number].  Nutrition focused physical exam completed.   No muscle or subcutaneous fat depletion noticed. Mild to moderate edema to bilateral lower extremities.   Diet Order:  Diet NPO time specified  Skin:  Reviewed, no issues  Last BM:  9/18  Intake/Output Summary (Last 24 hours) at 05/03/17 1230 Last data filed at 05/03/17 1100  Gross per 24 hour  Intake          4190.88 ml  Output             3280 ml  Net           910.88 ml   Height:   Ht Readings from Last 1 Encounters:  04/18/2017 6\' 1"  (1.854 m)   Weight:    Wt Readings from Last 1 Encounters:  05/06/2017 235 lb (106.6 kg)    Ideal Body Weight:  83.6 kg  BMI:  Body mass index is 31 kg/m.  Estimated Nutritional Needs:   Kcal:  0865-7846  Protein:  >/= 163 gm  Fluid:  per MD  EDUCATION NEEDS:   No education needs identified at this time  Arthur Holms, RD, LDN Pager #: [phone] After-Hours Pager #: [phone]

## 2017-05-03 NOTE — Progress Notes (Addendum)
Subjective: Patient is currently breathing over the vent, eyes deviated upward, no response to pain or spontaneous movement. Weaning off propofol.   Objective: Current vital signs: BP 102/68   Pulse (!) 106   Temp 98.2 F (36.8 C) (Core (Comment))   Resp 19   Ht 6' 1"  (1.854 m)   Wt 106.6 kg (235 lb)   SpO2 100%   BMI 31.00 kg/m  Vital signs in last 24 hours: Temp:  [96.6 F (35.9 C)-98.2 F (36.8 C)] 98.2 F (36.8 C) (09/20 1000) Pulse Rate:  [67-106] 106 (09/20 1000) Resp:  [12-27] 19 (09/20 1000) BP: (94-131)/(57-96) 102/68 (09/20 1000) SpO2:  [95 %-100 %] 100 % (09/20 1000) Arterial Line BP: (77-127)/(40-63) 86/46 (09/20 1000) FiO2 (%):  [40 %] 40 % (09/20 0830)  ROS:                                                                                                                                       History obtained from unobtainable from patient due to mental status  Neurologic Exam: General: NAD Mental Status: Patient does not respond to verbal stimuli.  Does not respond to deep sternal rub.  Does not follow commands.  No verbalizations noted.  Cranial Nerves: II: patient does not respond confrontation bilaterally, pupils right 1 mm, left 1 mm,and and minimally if not reactive bilaterally III,IV,VI: doll's response absent bilaterally. Both eyes deviated uparward V,VII: corneal reflex present bilaterally  VIII: patient does not respond to verbal stimuli IX,X: gag reflex difficult to assess as he is trached, XI: trapezius strength unable to test bilaterally XII: tongue strength unable to test Motor: Extremities flaccid throughout.  No spontaneous movement noted.  No purposeful movements noted. Sensory: Does not respond to noxious stimuli in any extremity. Deep Tendon Reflexes:  Absent throughout. Plantars: absent bilaterally Cerebellar: Unable to perform  Lab Results: Basic Metabolic Panel:  Recent Labs Lab 04/30/2017 0305 05/05/2017 0314 05/01/2017 0653  04/22/2017 1622 05/01/2017 2132 05/03/17 0738  NA 129* 549* 826* 415*  DUPLICATE REQUEST 830* 940*  K 4.2 4.2 4.2 3.5  DUPLICATE REQUEST 3.9 3.1*  CL 79* 79* 82* 84*  DUPLICATE REQUEST 85* 86*  CO2 31  --  36* 33*  DUPLICATE REQUEST 31 33*  GLUCOSE 140* 768* 088* 110*  DUPLICATE REQUEST 315* 66  BUN 75* 81* 80* 80*  DUPLICATE REQUEST 81* 77*  CREATININE 2.99* 2.80* 9.45* 8.59*  DUPLICATE REQUEST 2.92* 2.91*  CALCIUM 8.4*  --  8.0* 8.1*  DUPLICATE REQUEST 8.1* 8.1*  MG  --   --   --   --   --  1.5*  PHOS  --   --   --   --   --  1.5*    Liver Function Tests:  Recent Labs Lab 04/30/2017 0305  AST 42*  ALT 17  ALKPHOS 80  BILITOT 0.4  PROT 6.5  ALBUMIN 3.1*  CBC:  Recent Labs Lab 04/22/2017 0305 05/13/2017 0314 05/06/2017 0653 04/19/2017 1622 05/10/2017 2329 05/03/17 0353  WBC 8.5  --  10.0 9.2 10.6* 10.2  NEUTROABS 5.4  --   --  7.2  --   --   HGB 7.3* 8.8* 7.2* 6.5* 7.6* 7.7*  HCT 26.1* 26.0* 24.6* 21.9* 25.0* 25.4*  MCV 94.6  --  91.8 90.9 90.3 89.8  PLT 147*  --  148* 109* 133* 137*    Cardiac Enzymes:  Recent Labs Lab 04/23/2017 0305 04/28/2017 0653 05/13/2017 1622 04/15/2017 2132  TROPONINI 0.03* 0.61* 3.57* 3.53*    Lipid Panel:  Recent Labs Lab 04/14/2017 0653  TRIG 37    CBG:  Recent Labs Lab 05/03/17 0514 05/03/17 0544 05/03/17 0621 05/03/17 0732 05/03/17 0940  GLUCAP 45* 61* 107* 52 73    Microbiology: Results for orders placed or performed during the hospital encounter of 04/18/2017  Culture, blood (routine x 2)     Status: None (Preliminary result)   Collection Time: 05/01/2017  3:05 AM  Result Value Ref Range Status   Specimen Description BLOOD RIGHT HAND  Final   Special Requests   Final    BOTTLES DRAWN AEROBIC AND ANAEROBIC Blood Culture adequate volume   Culture  Setup Time   Final    GRAM POSITIVE COCCI IN CLUSTERS AEROBIC BOTTLE ONLY CRITICAL RESULT CALLED TO, READ BACK BY AND VERIFIED WITH: PHARMD Leonie Green [id number] MLM     Culture GRAM POSITIVE COCCI IN CLUSTERS  Final   Report Status PENDING  Incomplete  Blood Culture ID Panel (Reflexed)     Status: Abnormal   Collection Time: 04/19/2017  3:05 AM  Result Value Ref Range Status   Enterococcus species NOT DETECTED NOT DETECTED Final   Vancomycin resistance NOT DETECTED NOT DETECTED Final   Listeria monocytogenes NOT DETECTED NOT DETECTED Final   Staphylococcus species DETECTED (A) NOT DETECTED Final    Comment: CRITICAL RESULT CALLED TO, READ BACK BY AND VERIFIED WITH: PHARMD M TURNER [id number] 0815 MLM    Staphylococcus aureus NOT DETECTED NOT DETECTED Final   Methicillin resistance DETECTED (A) NOT DETECTED Final    Comment: CRITICAL RESULT CALLED TO, READ BACK BY AND VERIFIED WITH: PHARMD M TURNER [id number] 0815 MLM    Streptococcus species NOT DETECTED NOT DETECTED Final   Streptococcus agalactiae NOT DETECTED NOT DETECTED Final   Streptococcus pneumoniae NOT DETECTED NOT DETECTED Final   Streptococcus pyogenes NOT DETECTED NOT DETECTED Final   Acinetobacter baumannii NOT DETECTED NOT DETECTED Final   Enterobacteriaceae species NOT DETECTED NOT DETECTED Final   Enterobacter cloacae complex NOT DETECTED NOT DETECTED Final   Escherichia coli NOT DETECTED NOT DETECTED Final   Klebsiella oxytoca NOT DETECTED NOT DETECTED Final   Klebsiella pneumoniae NOT DETECTED NOT DETECTED Final   Proteus species NOT DETECTED NOT DETECTED Final   Serratia marcescens NOT DETECTED NOT DETECTED Final   Carbapenem resistance NOT DETECTED NOT DETECTED Final   Haemophilus influenzae NOT DETECTED NOT DETECTED Final   Neisseria meningitidis NOT DETECTED NOT DETECTED Final   Pseudomonas aeruginosa NOT DETECTED NOT DETECTED Final   Candida albicans NOT DETECTED NOT DETECTED Final   Candida glabrata NOT DETECTED NOT DETECTED Final   Candida krusei NOT DETECTED NOT DETECTED Final   Candida parapsilosis NOT DETECTED NOT DETECTED Final   Candida tropicalis NOT DETECTED NOT DETECTED  Final  Respiratory Panel by PCR     Status: None   Collection Time: 04/15/2017  6:53 AM  Result Value Ref Range Status   Adenovirus NOT DETECTED NOT DETECTED Final   Coronavirus 229E NOT DETECTED NOT DETECTED Final   Coronavirus HKU1 NOT DETECTED NOT DETECTED Final   Coronavirus NL63 NOT DETECTED NOT DETECTED Final   Coronavirus OC43 NOT DETECTED NOT DETECTED Final   Metapneumovirus NOT DETECTED NOT DETECTED Final   Rhinovirus / Enterovirus NOT DETECTED NOT DETECTED Final   Influenza A NOT DETECTED NOT DETECTED Final   Influenza A H1 NOT DETECTED NOT DETECTED Final   Influenza A H1 2009 NOT DETECTED NOT DETECTED Final   Influenza A H3 NOT DETECTED NOT DETECTED Final   Influenza B NOT DETECTED NOT DETECTED Final   Parainfluenza Virus 1 NOT DETECTED NOT DETECTED Final   Parainfluenza Virus 2 NOT DETECTED NOT DETECTED Final   Parainfluenza Virus 3 NOT DETECTED NOT DETECTED Final   Parainfluenza Virus 4 NOT DETECTED NOT DETECTED Final   Respiratory Syncytial Virus NOT DETECTED NOT DETECTED Final   Bordetella pertussis NOT DETECTED NOT DETECTED Final   Chlamydophila pneumoniae NOT DETECTED NOT DETECTED Final   Mycoplasma pneumoniae NOT DETECTED NOT DETECTED Final  Culture, respiratory (NON-Expectorated)     Status: None (Preliminary result)   Collection Time: 05/03/17  3:16 AM  Result Value Ref Range Status   Specimen Description TRACHEAL ASPIRATE  Final   Special Requests NONE  Final   Gram Stain   Final    MODERATE WBC PRESENT,BOTH PMN AND MONONUCLEAR NO SQUAMOUS EPITHELIAL CELLS SEEN FEW GRAM NEGATIVE RODS RARE GRAM POSITIVE COCCI    Culture PENDING  Incomplete   Report Status PENDING  Incomplete    Coagulation Studies:  Recent Labs  04/21/2017 0653 04/21/2017 1634 04/24/2017 2216  LABPROT 15.6* 16.2* 16.0*  INR 1.25 1.31 1.29    Imaging: NCCT head IMPRESSION: Motion degraded study without acute intracranial abnormality.  Medications:  Scheduled: . arformoterol  15 mcg  Nebulization BID  . budesonide (PULMICORT) nebulizer solution  0.5 mg Nebulization BID  . chlorhexidine gluconate (MEDLINE KIT)  15 mL Mouth Rinse BID  . Chlorhexidine Gluconate Cloth  6 each Topical Daily  . feeding supplement (PRO-STAT SUGAR FREE 64)  30 mL Per Tube BID  . feeding supplement (VITAL HIGH PROTEIN)  1,000 mL Per Tube Q24H  . insulin aspart  1-3 Units Subcutaneous Q4H  . ipratropium-albuterol  3 mL Nebulization Q6H  . mouth rinse  15 mL Mouth Rinse 10 times per day  . pantoprazole (PROTONIX) IV  40 mg Intravenous QHS  . sodium chloride flush  10-40 mL Intracatheter Q12H    EEG:  Impression: This EEG is markedly abnormal due to burst suppression pattern. Clinical Correlation of the above findings indicates severe diffuse cerebral dysfunction that is non-specific in etiology and can be seen in the setting of anoxic/ischemic injury, toxic/metabolic encephalopathies, or medication effect from Propofol. There were no electrographic seizures seen. Clinical correlation is advised.   Assessment/Plan: 66 y.o. male with a history of atrial fibrillation on Eliquis, diabetes mellitus TIA, peripheral vascular disease, CABG, obstructive sleep apnea, hypertension, COPD with chronic respiratory failure, brought to the ED after being found unresponsive at home. He was found to be in asystole/PEA by EMS and resuscitation was initiated and continued for 30-40 minutes Prior to return of spontaneous circulation..  Placed on cooling protocol and was just returned to normothermic temperature today at 0800.  Exam as above. Prior to sedation he showed myoclonus activity which is not a good prognostic sign as well as another poor prognostic  sign of burst suppression  (but he was on sedation with propofol at that time so that could be a result of the sedation ) - however will need to wait 72 hours to make final prognosis.  At this time guarded but do not feel he will return to his previous baseline given  amount time down and myoclonus present on presentation.   Would recommend getting an MRI brain in 24 hours to assess for the extent of anoxic injury.   Etta Quill PA-C [facility] [phone]  05/03/2017, 10:30 AM   Attending addendum Patient seen and examined. Agree with the history and physical and assessment plan documented above that I have helps to formulate.  Amie Portland, MD [facility] [phone]  If 7pm to 7am, please call on call as listed on AMION.

## 2017-05-03 NOTE — Progress Notes (Signed)
PULMONARY / CRITICAL CARE MEDICINE   Name: [name] [name] MRN: [id number] DOB: [birth year]    ADMISSION DATE:  04/21/2017  HISTORY OF PRESENT ILLNESS:   89yoM with h/o dCHF, CAD s/p CABG, CKD stage 3, COPD/OSA s.p tracheostomy on chronic night vent, DM, HTN, A.Fib/Aflutter on eliquis, who is s/p recent admission 9/5-9/8 for new Afib with RVR and UTI. Now admitted overnight with out-of-hospital cardiac arrest (asystole/PEA), s/p ROSC after 30-54min CPR. Upon arrival to ED patient obtunded with myoclonic seizure activity.   Overnight he was started on normothermic protocol. There was concern for possible seizure. RN describes it as lip twitching. At time of my exam he is having chewing movements of his mouth/lips and has intermittent myoclonic jerks in his BUE. He was started on propofol gtt by [facility] as treatment for possible seizure. EEG is being set up during time of my exam. He continues to be in shock requiring 42mcg levophed. He has slow oozing of blood from his right femoral line. He is unresponsive at time of my exam and no family is present in the room.   REVIEW OF SYSTEMS:   Review of Systems  Unable to perform ROS: Critical illness   Interval history: femoral CVL site continues to ooze. EEG with burst suppression pattern, severe cerebral dysfunction. Possibly related to anoxic injury or sedation with propofol.   VITAL SIGNS: BP (!) 131/92   Pulse 86   Temp (!) 96.8 F (36 C) (Core (Comment))   Resp (!) 24   Ht 6\' 1"  (1.854 m)   Wt 106.6 kg (235 lb)   SpO2 100%   BMI 31.00 kg/m   HEMODYNAMICS: CVP:  [9 mmHg-18 mmHg] 10 mmHg  VENTILATOR SETTINGS: Vent Mode: PRVC FiO2 (%):  [40 %-50 %] 40 % Set Rate:  [24 bmp] 24 bmp Vt Set:  [620 mL] 620 mL PEEP:  [5 cmH20] 5 cmH20 Plateau Pressure:  [29 cmH20-37 cmH20] 29 cmH20  INTAKE / OUTPUT: I/O last 3 completed shifts: In: 6376.3 [I.V.:6146.3; Blood:30; IV Piggyback:200] Out: 7673 [Urine:4015; Emesis/NG output:800]  PHYSICAL  EXAMINATION: General: obese male, trach on vent, critically ill Neuro: chewing movements of mouth and lips that worsen with mouth care, intermittent myoclonic jerks in bilateral arms, no response to sternal rub.   HEENT: 6.0 Shiley Prox XLT trach; NG tube Cardiovascular: RRR no m/r/g Lungs: CTA b/l Abdomen: Obese soft NTND Musculoskeletal: 1+ BLE edema Skin: arctic sun cooling pads in place  LABS:  BMET  Recent Labs Lab 05/01/2017 0653 05/06/2017 1622 04/29/2017 2132  NA 419* 379*  DUPLICATE REQUEST 024*  K 4.2 3.5  DUPLICATE REQUEST 3.9  CL 82* 84*  DUPLICATE REQUEST 85*  CO2 36* 33*  DUPLICATE REQUEST 31  BUN 80* 80*  DUPLICATE REQUEST 81*  CREATININE 0.97* 3.53*  DUPLICATE REQUEST 2.99*  GLUCOSE 242* 683*  DUPLICATE REQUEST 419*    Electrolytes  Recent Labs Lab 04/17/2017 0653 05/03/2017 1622 04/28/2017 2132  CALCIUM 8.0* 8.1*  DUPLICATE REQUEST 8.1*    CBC  Recent Labs Lab 05/05/2017 1622 04/24/2017 2329 05/03/17 0353  WBC 9.2 10.6* 10.2  HGB 6.5* 7.6* 7.7*  HCT 21.9* 25.0* 25.4*  PLT 109* 133* 137*    Coag's  Recent Labs Lab 04/26/2017 0653 05/10/2017 1634 04/26/2017 2216 04/14/2017 2329 05/03/17 0353  APTT 36 71*  --  45* 46*  INR 1.25 1.31 1.29  --   --     Sepsis Markers  Recent Labs Lab 04/21/2017 0305 04/22/2017 0314 04/29/2017 0653 05/03/17  9381  LATICACIDVEN  --  10.96* 1.5  --   PROCALCITON 0.10  --   --  19.75    ABG  Recent Labs Lab 05/03/2017 0312 04/14/2017 0450  PHART 7.340* 7.427  PCO2ART 76.0* 58.6*  PO2ART 389.0* 41.0*    Liver Enzymes  Recent Labs Lab 05/01/2017 0305  AST 42*  ALT 17  ALKPHOS 80  BILITOT 0.4  ALBUMIN 3.1*    Cardiac Enzymes  Recent Labs Lab 04/27/2017 0653 05/03/2017 1622 05/10/2017 2132  TROPONINI 0.61* 3.57* 3.53*    Glucose  Recent Labs Lab 05/01/2017 1703 04/25/2017 2321 05/03/17 0436 05/03/17 0514 05/03/17 0544 05/03/17 0621  GLUCAP 110* 77 20* 45* 61* 107*    Imaging Dg Abd Portable  1v  Result Date: 05/13/2017 CLINICAL DATA:  Check nasogastric catheter placement EXAM: PORTABLE ABDOMEN - 1 VIEW COMPARISON:  None. FINDINGS: Nasogastric catheter is noted with the tip in the distal stomach. Scattered small and large bowel gas is noted. No obstructive changes are seen. No acute bony abnormality is noted. IMPRESSION: Nasogastric catheter within the distal stomach. Electronically Signed   By: Inez Catalina M.D.   On: 04/20/2017 08:11   STUDIES:  Head CT (05/06/2017): Motion degraded study without acute intracranial abnormality. Chest CT w/o contrast (05/03/2017): 1. Diffuse patchy airspace disease in both lungs may indicate edema, pneumonia, or aspiration. Small bilateral pleural effusions with basilar atelectasis. Stable cardiac enlargement. Prominent mediastinal lymph nodes without change since prior study. Diffuse thyroid gland enlargement consistent with coronary, unchanged. Bilateral pleural calcification with loculated fluid collection on the left. This appearance is unchanged since previous study and may indicate prior infection or pleurodesis. Cholelithiasis. Emphysematous changes in the lungs. Aortic atherosclerosis. EEG (9/19): This EEG is markedly abnormal due to burst suppression pattern. Indicates severe diffuse cerebral dysfunction that is non-specific in etiology and can be seen in the setting of anoxic/ischemic injury, toxic/metabolic encephalopathies, or medication effect from Propofol. TTE (pending) (TTE from 04/19/17 with LVEF 55-60%,)   CULTURES: Blood cultures (9/19): pending Sputum culture (ordered) Urine culture (ordered)  ANTIBIOTICS: Vancomycin and Zosyn (both started 9/19)  LINES/TUBES: #6 Shiley Proximal XLT Trach Right Femoral TLC (placed 9/19 AM) NG tube (placed 9/19 AM) 18G L-hand, 20G L-arm PIV's Right radial Aline (placed 9/19 AM)   ASSESSMENT / PLAN: 79yoM with h/o dCHF, CAD s/p CABG, CKD stage 3, COPD/OSA s.p tracheostomy on chronic night vent, DM,  HTN, A.Fib/Aflutter on eliquis, who is s/p recent admission 9/5-9/8 for new Afib with RVR and UTI. Now admitted with out-of-hospital cardiac arrest (asystole/PEA), s/p ROSC after 30-54min CPR. Upon arrival to ED patient obtunded with myoclonic seizure activity.   PULMONARY A: Acute Hypoxic and Chronic Hypercapneic Respiratory failure Aspiration pneumonia;  COPD without acute exacerbation OSA P: Full vent support ABG reviewed, settings adjusted Follow CXR Budesonide 0.5 BID continue brovana bid and duonebs QID.  Trach care  CARDIOVASCULAR A: Cardiac arrest - out of hospital asystole/PEA arrest, s/p ROSC after 30-40 minutes Shock, cardiogenic vs septic CAD A-fib/flutter - rate controlled Hx HTN P: Undergoing targeted temperature management therapy 36C TTE pending Levophed to keep MAP > 81mmHg Holding outpatient antihypertensives Holding systemic anticoagulation due to oozing CVL site. Continue to trend troponin.    RENAL A: AKI-on-CKD:creatinine 2.83 down from 2.99 on admit, up from baseline of 2.2-2.3 P: continue foley catheter and IVF's Monitor UOP, Avoid nephrotoxic agents   HEMATOLOGIC A: Anemia: has hx anemia of unclear etiology, HBG low but stable to baseline. Currently oozing from CVL site  P: Holding heparin Hope we can remove CVL if pressor demands resolve once sedation turned down/off If not will consider PICC.  Follow CBC, coags  INFECTIOUS A: Septic shock from Aspiration pneumonia and UTI: Chest CT shows multifocal infiltrates (edema vs pna) but procalcitonin is only 0.10. Lactate improved from 10.9 to 1.5 with IVF's; received 3L IVF per [facility]   P: continue NS at 125cc/hr Pan-cultured; follow-up results Continue Vanc and Zosyn  ENDOCRINE A: DM Hypoglycemia P: Insulin gtt off, lantus held.  CBG monitoring and SSI  NEUROLOGIC A: Anoxic encephalopathy Myoclonic Jerks Vs Questionable seizure: EEG burst suppression.  P: Start to wean sedation. He  is not on true hypothermia protocol, does not need deep sedation.  Neurology following, recommending Depacon.  Ideally WUA this AM Worrisome for poor neurologic outcome.   Georgann Housekeeper, AGACNP-BC [place] [facility]/Critical Care Pager [phone] or [phone]  05/03/2017 7:54 AM   Attending Addendum: I personally examined this patient and agree with plan as detailed above. Hypoxic respiratory failure continue mechanical ventilation. Obtain new ABG and wean vent. Obtain new CXR and followup is aspiration pneumonia is improving. Continued to have septic shock requiring vasopressors. Blood cultures from 9/19 are growing 1/2 GPC's. Will repeat blood cultures today. Continue Vanc and Zosyn. Will need new central line if cannot wean off vasopressors. Rewarming patient now. Start weaning sedation. Anemia with Hgb 6.5 overnight, s/p 1u pRBC transfusion, improved to 7.7 post-transfusion. Slow oozing from femoral central line site. Thrombin pad in place. Check fibrinogen level. AKI-on-CKD but good UOP; follow-up renal function today. Start trickle tube feeds once rewarmed. Updated patient's wife that the severe anoxic encephalopathy seen on EEG as well as the myoclonic jerks we are seeing clinically are a very poor prognostic factor. She unfortunately has poor insight though, thinking that he had these same jerks prior to the cardiac arrest; she expressed hope that he will wake up and "be okay." We may want to obtain full Neuro consult for prognostication once patient's sedation is fully weaned off.   35 minutes critical care time   Vernie Murders, MD Pulmonary and [place] Pager: [phone]

## 2017-05-03 NOTE — Progress Notes (Signed)
  Echocardiogram 2D Echocardiogram has been performed.  Tresa Res 05/03/2017, 1:41 PM

## 2017-05-03 NOTE — Progress Notes (Signed)
[facility] for heparin  Indication: atrial fibrillation  Allergies  Allergen Reactions  . Amlodipine Besy-Benazepril Hcl Swelling    LIPS SWELL ANGIOEDEMA  . Percocet [Oxycodone-Acetaminophen] Other (See Comments)    HALLUCINATIONS    Patient Measurements: Height: 6\' 1"  (185.4 cm) Weight: 235 lb (106.6 kg) IBW/kg (Calculated) : 79.9 Heparin Dosing Weight: 102kg  Vital Signs: Temp: 96.8 F (36 C) (09/20 0730) Temp Source: Core (Comment) (09/20 0500) BP: 131/92 (09/20 0700) Pulse Rate: 86 (09/20 0700)  Labs:  Recent Labs  05/09/2017 0653 04/22/2017 1045 04/20/2017 1622 04/26/2017 1634 05/13/2017 2132 04/28/2017 2216 04/21/2017 2329 05/03/17 0353  HGB 7.2*  --  6.5*  --   --   --  7.6* 7.7*  HCT 24.6*  --  21.9*  --   --   --  25.0* 25.4*  PLT 148*  --  109*  --   --   --  133* 137*  APTT 36  --   --  71*  --   --  45* 46*  LABPROT 15.6*  --   --  16.2*  --  16.0*  --   --   INR 1.25  --   --  1.31  --  1.29  --   --   HEPARINUNFRC  --  >2.20*  --   --   --   --   --   --   CREATININE 2.88*  --  4.27*  DUPLICATE REQUEST  --  0.62*  --   --   --   TROPONINI 0.61*  --  3.57*  --  3.53*  --   --   --     Estimated Creatinine Clearance: 33.3 mL/min (A) (by C-G formula based on SCr of 2.83 mg/dL (H)).  Assessment: 24 yoM on apixaban PTA for newly diagnosed AFib presents s/p cardiac arrest and currently undergoing TTM 36 degrees. Pharmacy consulted to start heparin infusion while holding apixaban. Initial aPTT therapeutic at 71 seconds, however pt has had significant oozing from central line site and heparin has been on hold. After discussion with CCM, will continue to hold anticoagulation for now and reassess tomorrow.  Goal of Therapy:  Heparin level 0.3-0.7 units/ml aPTT 66-102 seconds Monitor platelets by anticoagulation protocol: Yes   Plan:   -Hold heparin for now -Follow-up restarting tomorrow -Monitor CBC and S/Sx  bleeding  Arrie Senate, PharmD PGY-2 Cardiology Pharmacy Resident Pager: [phone] 05/03/2017

## 2017-05-03 NOTE — Progress Notes (Signed)
Noticed TF orders were D/C.  Paged nutritionist to explore this.  Unable to get in touch with nutritionist.  Will give prostat as ordered.  Will call [facility] to ask them. Will continue to monitor closely and update as needed.

## 2017-05-03 NOTE — Progress Notes (Signed)
Hypoglycemic Event  CBG: 20  Treatment: D50 IV 25 mL  Symptoms: None  Follow-up CBG: Time:0500 CBG Result:45  Possible Reasons for Event: Inadequate meal intake  Comments/MD notified: Another 25 ml of D50 given. Will continue to monitor blood sugar.     [place]

## 2017-05-04 ENCOUNTER — Inpatient Hospital Stay (HOSPITAL_COMMUNITY): Payer: Medicare Other

## 2017-05-04 LAB — BLOOD GAS, ARTERIAL
Acid-Base Excess: 10.9 mmol/L — ABNORMAL HIGH (ref 0.0–2.0)
BICARBONATE: 36.1 mmol/L — AB (ref 20.0–28.0)
FIO2: 40
LHR: 12 {breaths}/min
O2 Saturation: 98.5 %
PEEP: 5 cmH2O
Patient temperature: 98.6
VT: 420 mL
pCO2 arterial: 60.1 mmHg — ABNORMAL HIGH (ref 32.0–48.0)
pH, Arterial: 7.396 (ref 7.350–7.450)
pO2, Arterial: 113 mmHg — ABNORMAL HIGH (ref 83.0–108.0)

## 2017-05-04 LAB — BASIC METABOLIC PANEL
Anion gap: 11 (ref 5–15)
BUN: 78 mg/dL — ABNORMAL HIGH (ref 6–20)
CHLORIDE: 89 mmol/L — AB (ref 101–111)
CO2: 34 mmol/L — ABNORMAL HIGH (ref 22–32)
Calcium: 7.6 mg/dL — ABNORMAL LOW (ref 8.9–10.3)
Creatinine, Ser: 3.16 mg/dL — ABNORMAL HIGH (ref 0.61–1.24)
GFR calc Af Amer: 22 mL/min — ABNORMAL LOW (ref >60)
GFR, EST NON AFRICAN AMERICAN: 19 mL/min — AB (ref >60)
GLUCOSE: 101 mg/dL — AB (ref 65–99)
POTASSIUM: 4 mmol/L (ref 3.5–5.1)
Sodium: 134 mmol/L — ABNORMAL LOW (ref 135–145)

## 2017-05-04 LAB — CBC
HCT: 21.6 % — ABNORMAL LOW (ref 39.0–52.0)
HEMOGLOBIN: 6.6 g/dL — AB (ref 13.0–17.0)
MCH: 26.8 pg (ref 26.0–34.0)
MCHC: 30.6 g/dL (ref 30.0–36.0)
MCV: 87.8 fL (ref 78.0–100.0)
PLATELETS: 115 10*3/uL — AB (ref 150–400)
RBC: 2.46 MIL/uL — AB (ref 4.22–5.81)
RDW: 16.3 % — ABNORMAL HIGH (ref 11.5–15.5)
WBC: 8.7 10*3/uL (ref 4.0–10.5)

## 2017-05-04 LAB — CULTURE, BLOOD (ROUTINE X 2): Special Requests: ADEQUATE

## 2017-05-04 LAB — GLUCOSE, CAPILLARY
GLUCOSE-CAPILLARY: 115 mg/dL — AB (ref 65–99)
GLUCOSE-CAPILLARY: 137 mg/dL — AB (ref 65–99)
GLUCOSE-CAPILLARY: 142 mg/dL — AB (ref 65–99)
GLUCOSE-CAPILLARY: 146 mg/dL — AB (ref 65–99)
Glucose-Capillary: 115 mg/dL — ABNORMAL HIGH (ref 65–99)
Glucose-Capillary: 145 mg/dL — ABNORMAL HIGH (ref 65–99)

## 2017-05-04 LAB — PREPARE RBC (CROSSMATCH)

## 2017-05-04 LAB — PHOSPHORUS: PHOSPHORUS: 6 mg/dL — AB (ref 2.5–4.6)

## 2017-05-04 LAB — PROCALCITONIN: Procalcitonin: 17.72 ng/mL

## 2017-05-04 LAB — MAGNESIUM: MAGNESIUM: 1.6 mg/dL — AB (ref 1.7–2.4)

## 2017-05-04 MED ORDER — AMIODARONE HCL IN DEXTROSE 360-4.14 MG/200ML-% IV SOLN
60.0000 mg/h | INTRAVENOUS | Status: AC
  Administered 2017-05-04 (×2): 60 mg/h via INTRAVENOUS
  Filled 2017-05-04 (×2): qty 200

## 2017-05-04 MED ORDER — FENTANYL BOLUS VIA INFUSION
50.0000 ug | INTRAVENOUS | Status: DC | PRN
  Filled 2017-05-04: qty 200

## 2017-05-04 MED ORDER — SODIUM CHLORIDE 0.9 % IV SOLN
Freq: Once | INTRAVENOUS | Status: AC
  Administered 2017-05-04: 04:00:00 via INTRAVENOUS

## 2017-05-04 MED ORDER — AMIODARONE LOAD VIA INFUSION
150.0000 mg | Freq: Once | INTRAVENOUS | Status: AC
  Administered 2017-05-04: 150 mg via INTRAVENOUS
  Filled 2017-05-04: qty 83.34

## 2017-05-04 MED ORDER — DILTIAZEM HCL 100 MG IV SOLR: 5.0000 mg/h | INTRAVENOUS | Status: DC

## 2017-05-04 MED ORDER — MAGNESIUM SULFATE 2 GM/50ML IV SOLN
2.0000 g | Freq: Once | INTRAVENOUS | Status: AC
  Administered 2017-05-04: 2 g via INTRAVENOUS
  Filled 2017-05-04: qty 50

## 2017-05-04 MED ORDER — AMIODARONE HCL IN DEXTROSE 360-4.14 MG/200ML-% IV SOLN
30.0000 mg/h | INTRAVENOUS | Status: DC
  Administered 2017-05-04: 30 mg/h via INTRAVENOUS
  Filled 2017-05-04: qty 200

## 2017-05-04 MED ORDER — FENTANYL 2500MCG IN NS 250ML (10MCG/ML) PREMIX INFUSION
25.0000 ug/h | INTRAVENOUS | Status: DC
Start: 2017-05-04 — End: 2017-05-05
  Administered 2017-05-04: 50 ug/h via INTRAVENOUS
  Filled 2017-05-04 (×2): qty 250

## 2017-05-05 LAB — TYPE AND SCREEN
ABO/RH(D): O POS
Antibody Screen: NEGATIVE
UNIT DIVISION: 0
Unit division: 0

## 2017-05-05 LAB — BPAM RBC
BLOOD PRODUCT EXPIRATION DATE: 201810122359
Blood Product Expiration Date: 201810112359
ISSUE DATE / TIME: 201809191726
ISSUE DATE / TIME: 201809210324
UNIT TYPE AND RH: 5100
Unit Type and Rh: 5100

## 2017-05-05 NOTE — Progress Notes (Signed)
200 mls of fentanyl wasted down sink with S.[place]. RN returned 250 ml bag (with patient label) of unopened fentanyl to [facility]. In pharmacy.

## 2017-05-06 LAB — CULTURE, RESPIRATORY

## 2017-05-06 LAB — CULTURE, RESPIRATORY W GRAM STAIN

## 2017-05-07 LAB — CULTURE, BLOOD (ROUTINE X 2)
CULTURE: NO GROWTH
Special Requests: ADEQUATE

## 2017-05-08 ENCOUNTER — Telehealth: Payer: Self-pay

## 2017-05-08 LAB — CULTURE, BLOOD (ROUTINE X 2)
CULTURE: NO GROWTH
Culture: NO GROWTH
SPECIAL REQUESTS: ADEQUATE

## 2017-05-08 NOTE — Telephone Encounter (Signed)
On 05/08/2017 I received a death certificate from [facility] (original). The death certificate is for burial. The patient is a patient of Vernie Murders although Doctor Titus Mould will be the physician to sign the d/c. The d/c will be taken to [facility] ([address]) this am for signature.  On 05/09/17 I received the d/c back form Doctor Halford Chessman. I got the d/c ready and called the funeral home to let them know the d/c is ready for pickup.

## 2017-05-14 NOTE — Progress Notes (Signed)
Pt taken off of vent per MD order.  RT will continue to monitor.

## 2017-05-14 NOTE — Progress Notes (Signed)
[facility] Progress Note Patient Name: [name] [name] DOB: 1950/10/19 MRN: [id number]   Date of Service  May 25, 2017  HPI/Events of Note  Anemia - Hgb = 6.6.  eICU Interventions  Will transfuse 1 unit PRBC now.     Intervention Category Major Interventions: Other:  Akari Crysler Cornelia Copa May 25, 2017, 3:04 AM

## 2017-05-14 NOTE — Progress Notes (Signed)
PULMONARY / CRITICAL CARE MEDICINE   Name: [name] [name] MRN: [id number] DOB: February 11, 1951    ADMISSION DATE:  04/26/2017 CONSULTATION DATE:  04/22/2017  REFERRING MD:  Dr. Dina Rich   CHIEF COMPLAINT:  Cardiac Arrest   BRIEF SUMMARY:  66 y/o M admitted 9/19 after suffering an out of hospital cardiac arrest (asystole/PEA).  He required 30-40 minutes of CPR before ROSC.  The patient carries a medical history of dCHF, CAD s/p CABG, HTN, AF/Aflutter on Eliquis, CKD 3, COPD / OSA s/p tracheostomy with chronic nocturnal vent dependence and recent admit from 9/5-9/8 for new [facility] & UTI.  On arrival to ER, he was obtunded and had myoclonic activity on exam.  He was treated with normothermia.  He was found to have septic shock in the setting of UTI and suspected aspiration PNA. Course complicated by [facility] and anemia requiring transfusion.    SUBJECTIVE:  RN reports pt developed AFwRVR overnight.  Anemia with Hgb to 6.6 / transfused 1 unit PRBC's.    VITAL SIGNS: BP 111/67   Pulse 93   Temp 98.6 F (37 C) (Core (Comment))   Resp 12   Ht 6\' 1"  (1.854 m)   Wt 251 lb 15.8 oz (114.3 kg)   SpO2 100%   BMI 33.25 kg/m   HEMODYNAMICS: CVP:  [9 mmHg-13 mmHg] 9 mmHg  VENTILATOR SETTINGS: Vent Mode: PRVC FiO2 (%):  [40 %] 40 % Set Rate:  [12 bmp] 12 bmp Vt Set:  [678 mL] 620 mL PEEP:  [5 cmH20] 5 cmH20 Plateau Pressure:  [20 cmH20-27 cmH20] 20 cmH20  INTAKE / OUTPUT: I/O last 3 completed shifts: In: 6398.8 [I.V.:5188.8; Blood:30; NG/GT:60; IV Piggyback:1120] Out: 4890 [Urine:3890; Emesis/NG output:1000]  PHYSICAL EXAMINATION: General: ill appearing adult male on vent HEENT: MM pink/moist, trach midline c/d/i, scleral edema  Neuro: pupils 27mm, upward gaze, minimal cough, no response to pain CV: s1s2 rrr, no m/r/g PULM: even/non-labored, lungs bilaterally coarse  LF:YBOF, non-tender, bsx4 active  Extremities: warm/dry, no edema  Skin: no rashes or lesions  LABS:  BMET  Recent  Labs Lab 04/18/2017 2132 05/03/17 0738 11-May-2017 0208  NA 130* 130* 134*  K 3.9 3.1* 4.0  CL 85* 86* 89*  CO2 31 33* 34*  BUN 81* 77* 78*  CREATININE 2.83* 2.91* 3.16*  GLUCOSE 113* 66 101*    Electrolytes  Recent Labs Lab 05/07/2017 2132 05/03/17 0738 May 11, 2017 0208  CALCIUM 8.1* 8.1* 7.6*  MG  --  1.5* 1.6*  PHOS  --  1.5* 6.0*    CBC  Recent Labs Lab 04/28/2017 2329 05/03/17 0353 05-11-17 0208  WBC 10.6* 10.2 8.7  HGB 7.6* 7.7* 6.6*  HCT 25.0* 25.4* 21.6*  PLT 133* 137* 115*    Coag's  Recent Labs Lab 05/13/2017 0653 05/08/2017 1634 05/03/2017 2216 05/12/2017 2329 05/03/17 0353  APTT 36 71*  --  45* 46*  INR 1.25 1.31 1.29  --   --     Sepsis Markers  Recent Labs Lab 04/20/2017 0305 05/05/2017 0314 04/23/2017 0653 05/03/17 0353 05-11-17 0208  LATICACIDVEN  --  10.96* 1.5  --   --   PROCALCITON 0.10  --   --  19.75 17.72    ABG  Recent Labs Lab 05/01/2017 0450 05/03/17 0801 May 11, 2017 0220  PHART 7.427 7.647* 7.396  PCO2ART 58.6* 33.4 60.1*  PO2ART 41.0* 127.0* 113*    Liver Enzymes  Recent Labs Lab 05/08/2017 0305  AST 42*  ALT 17  ALKPHOS 80  BILITOT 0.4  ALBUMIN 3.1*    Cardiac Enzymes  Recent Labs Lab 05/11/2017 0653 04/15/2017 1622 05/03/2017 2132  TROPONINI 0.61* 3.57* 3.53*    Glucose  Recent Labs Lab 05/03/17 1421 05/03/17 1616 05/03/17 1941 05/03/17 2347 2017/05/15 0316 05/15/2017 0740  GLUCAP 133* 157* 123* 137* 115* 145*    Imaging Dg Chest Port 1 View  Result Date: May 15, 2017 CLINICAL DATA:  Respiratory failure EXAM: PORTABLE CHEST 1 VIEW COMPARISON:  May 03, 2017 FINDINGS: Tracheostomy catheter tip is 7.2 cm above the carina. Nasogastric tube tip and side port are below the diaphragm. No pneumothorax. There is interstitial edema with small pleural effusions. No airspace consolidation. There is cardiomegaly with pulmonary venous hypertension. Patient is status post coronary artery bypass grafting. No evident adenopathy.  No bone lesions. There is aortic atherosclerosis. IMPRESSION: Tube positions as described without evident pneumothorax. Findings indicative of a degree of congestive heart failure, stable from 1 day prior. There is aortic atherosclerosis. Aortic Atherosclerosis (ICD10-I70.0). Electronically Signed   By: Lowella Grip III M.D.   On: May 15, 2017 08:03     STUDIES:  TTE 04/19/17 >> LVEF 55-60%, Head CT 05/08/2017 >> Motion degraded study without acute intracranial abnormality. Chest CT w/o contrast 05/12/2017 >> Diffuse patchy airspace disease in both lungs may indicate edema, pneumonia, or aspiration. Small bilateral pleural effusions with basilar atelectasis. Stable cardiac enlargement. Prominent mediastinal lymph nodes without change since prior study. Diffuse thyroid gland enlargement consistent with coronary, unchanged. Bilateral pleural calcification with loculated fluid collection on the left. This appearance is unchanged since previous study and may indicate prior infection or pleurodesis. Cholelithiasis. Emphysematous changes in the lungs. Aortic atherosclerosis. EEG 9/19 >> EEG is markedly abnormal due to burst suppression pattern. Indicates severe diffuse cerebral dysfunction that is non-specific in etiology and can be seen in the setting of anoxic/ischemic injury, toxic/metabolic encephalopathies, or medication effect from Propofol. TTE 9/20 >> LVEF 55-60%, no RWMA, RA/RV mild-mod dilated  CULTURES: RVP 9/19 >> negative  Tracheal Aspirate 9/20 >> BCID 9/19 >> staph species detected, methicillin resistant coag-neg detected 1/2 BCx2 9/19 >> GPC's in clusters 1/2 >>  ANTIBIOTICS: Vanco 9/19 >> 9/21  Zosyn 9/19 >>   SIGNIFICANT EVENTS: 09/19  Admit after cardiac arrest  09/20  Transfused 1 unit PRBC for anemia / oozing from central line site 09/21 AFwRVR, amio initiated, anemia / tx 1 unit PRBC  LINES/TUBES: Chronic Trach (#6 proximal XLT) >>  Right Femoral TLC 9/19 >>  NGT 9/19 >>   Right Radial Aline 9/19 >>   DISCUSSION: 66 y/o M admitted 9/19 after suffering an out of hospital cardiac arrest (asystole/PEA).  He required 30-40 minutes of CPR before ROSC.  The patient carries a medical history of dCHF, CAD s/p CABG, HTN, AF/Aflutter on Eliquis, CKD 3, COPD / OSA s/p tracheostomy with chronic nocturnal vent dependence and recent admit from 9/5-9/8 for new [facility] & UTI.  On arrival to ER, he was obtunded and had myoclonic activity on exam.  He was treated with normothermia.  He was found to have septic shock in the setting of UTI and suspected aspiration PNA. Course complicated by [facility] and anemia requiring transfusion.    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 P:   PRVC 8 cc/kg Wean PEEP / FiO2 for sats 88-94% Intermittent CXR  Trach care per protocol  CARDIOVASCULAR A:  Asystole / PEA Arrest  Shock - septic vs cardiogenic  AFwRVR - now rate controlled 9/21, baseline on Eliquis  Hx HTN, CAD s/p CABG P:  Normothermia protocol  Tele monitoring  Transfused PRBC  Continue amiodarone gtt   Levophed for MAP >65   RENAL A:   AKI on CKD Hypomagnesemia  P:   Trend BMP / urinary output Replace electrolytes as indicated Avoid nephrotoxic agents, ensure adequate renal perfusion 2gm Mg+ 9/21  GASTROINTESTINAL A:   At Risk Protein Calorie Malnutrition  P:   NPO  OGT Hold TF     HEMATOLOGIC A:   Anemia - element of chronic disease + bleeding from central line access  P:  Trend CBC  Transfuse for Hgb <7%, active bleeding or MI <8% Hold heparin gtt  SCD's for DVT prophylaxis   INFECTIOUS A:   Possible Aspiration PNA - no active infiltrate on CXR Recent UTI  P:   Cultures as above  Narrow ABX > coag neg methicillin resistant 1/2 only    Discontinue vancomycin   ENDOCRINE A:   Hx Gout  At Risk Hyper/Hypoglycemia   P:   Monitor glucose on BMP  NEUROLOGIC A:   Acute Metabolic Encephalopathy - myoclonus on exam at time of admission, suspect poor prognosis Concern for Severe Anoxic Injury P:   RASS goal: 0  Frequent neuro exams  Neurology following  Continue Valproate   FAMILY  - Updates: Wife updated at bedside am 9/21.  She states that he hated the ventilator when he first got the trach and that he would not want to live in a facility or require full time care.  She saw his neurologic exam this am and understands that it is abnormal.  She wants to discuss withdrawal of care with his brothers and sisters and get back to PCCM.  She will likely want to stop aggressive efforts given poor neurological exam. She is most concerned that he does not suffer during the process.    - Inter-disciplinary family meet or Palliative Care meeting due by:  ongoing  CC Time:  66 minutes   Noe Gens, NP-C [place] [facility] Pgr: [phone] or if no answer [phone] 2017/05/11, 9:38 AM  Attending Addendum: I personally examined this patient and agree with plan as detailed above. Sedation off but patient not awakening. No cough or gag on my exam. Pupils pinpoint and unresponsive to light. This is consistent with severe anoxic brain injury. Had discussion with patient's wife at the bedside. She says she wants to make him DNR and Do not escalate. She plans to withdraw care on him later today after more family arrives. Overnight patient anemic and received 1u pRBC. Femoral line continues to ooze, but will leave in place as plan to withdraw care. Blood cultures initially positive 1/2 sets MRSE; repeat cultures no growth x 24hrs. Continues on Zosyn for aspiration pneumonia; vancomycin has been stopped. Continues to have septic shock requiring vasopressors. Renal function continues to decline, likely from ATN. Hypomagnesemia  overnight was repleted. Labs show bicarb continues to increase which may be consistent with developing contraction metabolic alkylosis; but if we plan to withdraw, I will not intervene at this time.   45 minutes critical care time   Vernie Murders, MD  Pulmonary & Critical Care Medicine

## 2017-05-14 NOTE — Progress Notes (Signed)
Patient was a DNR on comfort care. @ [place] and [place] RN pronounced time of death. Patient was without signs of life including no breath sounds or heart tones with ascultation for 1 min. The final cardiac rhythm is noted as asystole.  The family was at the bedside with the patient and aware of his passing. Emotional support was given. Dr. Corinna Lines was notified of time of death.

## 2017-05-14 NOTE — Plan of Care (Addendum)
  Interdisciplinary Goals of Care Family Meeting   Date carried out:: 05/14/2017  Location of the meeting: Bedside  Member's involved: Physician, Nurse Practitioner, Bedside Registered Nurse and Family Member or next of kin  [place] or acting medical decision maker:  Wife / [name] [name]   Discussion: We discussed goals of care for [name] [name] .  Wife present for neurological exam this am.  Reviewed his entire admission details with family.  Discussed that there is concern for anoxic brain injury based on his exam and not waking after 72 hours / no improvement.  Wife states he would not want to continue full support.  She is calling the family in to visit and will plan to remove aggressive support later in the afternoon once family has visited. RN to call for withdrawal of care orders once family ready.   Code status: DNR  Disposition: Continue current acute care until family has visited.    Time spent for the meeting: 20 minutes    Noe Gens, NP-C [place] [facility] Pgr: [phone] or if no answer [phone] 2017/05/14, 10:53 AM

## 2017-05-14 NOTE — Progress Notes (Signed)
[place] Progress Note Patient Name: [name] [name] DOB: November 20, 1950 MRN: [id number]   Date of Service  05/13/17  HPI/Events of Note  AFIB with RVR HR as high as 130's. Hx of chronic AFIB/ALUTTER. Currenlty on   eICU Interventions  Will order: 1. Amiodarone IV load and infusion.      Intervention Category Major Interventions: Arrhythmia - evaluation and management  Gwyn Mehring [place] 13-May-2017, 2:07 AM

## 2017-05-14 NOTE — Care Management Note (Signed)
Case Management Note  Patient Details  Name: [name] [name] MRN: [id number] Date of Birth: Feb 28, 1951  Subjective/Objective:    Presents with  out of hospital cardiac arrest (asystole/PEA).  He required 30-40 minutes of CPR .  The patient has  history of CHF, CAD s/p CABG, HTN, AF/Aflutter , CKD 3, COPD / OSA s/p tracheostomy with chronic nocturnal vent dependence and recent admit from 9/5-9/8 .   He was found to have septic shock with UTI and suspected aspiration PNA, complicated by Afib with rvr and anemia requiring transfusion.   9/21 [place] RN BSN - family for goc meeting .                 Action/Plan: NCM will follow for dc needs.   Expected Discharge Date:                  Expected Discharge Plan:     In-House Referral:     Discharge planning Services  CM Consult  Post Acute Care Choice:    Choice offered to:     DME Arranged:    DME Agency:     HH Arranged:    HH Agency:     Status of Service:  In process, will continue to follow  If discussed at Long Length of Stay Meetings, dates discussed:    Additional Comments:  Zenon Mayo, RN 05/18/2017, 2:02 PM

## 2017-05-14 NOTE — Progress Notes (Signed)
[place] for heparin  Indication: atrial fibrillation  Allergies  Allergen Reactions  . Amlodipine Besy-Benazepril Hcl Swelling    LIPS SWELL ANGIOEDEMA  . Percocet [Oxycodone-Acetaminophen] Other (See Comments)    HALLUCINATIONS    Patient Measurements: Height: 6\' 1"  (185.4 cm) Weight: 251 lb 15.8 oz (114.3 kg) IBW/kg (Calculated) : 79.9 Heparin Dosing Weight: 102kg  Vital Signs: Temp: 98.6 F (37 C) (09/21 0736) Temp Source: Core (Comment) (09/21 0736) BP: 112/54 (09/21 0728) Pulse Rate: 92 (09/21 0728)  Labs:  Recent Labs  04/22/2017 0653 05/01/2017 1045 04/22/2017 1622 05/12/2017 1634 05/03/2017 2132 04/16/2017 2216 05/13/2017 2329 05/03/17 0353 05/03/17 0738 May 09, 2017 0208  HGB 7.2*  --  6.5*  --   --   --  7.6* 7.7*  --  6.6*  HCT 24.6*  --  21.9*  --   --   --  25.0* 25.4*  --  21.6*  PLT 148*  --  109*  --   --   --  133* 137*  --  115*  APTT 36  --   --  71*  --   --  45* 46*  --   --   LABPROT 15.6*  --   --  16.2*  --  16.0*  --   --   --   --   INR 1.25  --   --  1.31  --  1.29  --   --   --   --   HEPARINUNFRC  --  >2.20*  --   --   --   --   --   --   --   --   CREATININE 2.88*  --  0.09*  DUPLICATE REQUEST  --  3.81*  --   --   --  2.91* 3.16*  TROPONINI 0.61*  --  3.57*  --  3.53*  --   --   --   --   --     Estimated Creatinine Clearance: 30.9 mL/min (A) (by C-G formula based on SCr of 3.16 mg/dL (H)).  Assessment: 27 yoM on apixaban PTA for newly diagnosed AFib presents s/p cardiac arrest and currently undergoing TTM 36 degrees. Pharmacy consulted to start heparin infusion while holding apixaban. Initial aPTT therapeutic at 71 seconds, however pt has had significant oozing from central line site and heparin has been on hold.  Hgb down to 6.6 this morning requiring pRBCs - continue to hold heparin.  Goal of Therapy:  Heparin level 0.3-0.7 units/ml aPTT 66-102 seconds Monitor platelets by anticoagulation protocol:  Yes   Plan:   -Hold heparin for now -Follow-up plans to restart -Monitor CBC and S/Sx bleeding  Arrie Senate, PharmD PGY-2 Cardiology Pharmacy Resident Pager: [phone] 2017-05-09

## 2017-05-14 NOTE — Progress Notes (Signed)
   05-25-2017 2100  Clinical Encounter Type  Visited With Patient and family together;Health care provider  Visit Type Critical Care  Referral From Nurse  Consult/Referral To Chaplain  Spiritual Encounters  Spiritual Needs Emotional;Grief support;Prayer  Stress Factors  Family Stress Factors Major life changes   Responded to 2H for EOL.  Worked with the family and medical staff to provide care and support as the begin to transition to EOL.  Prayed with the family and provided hospitality to the entire family.  Will follow up again and let the staff know to page me if the patients condition changed.   Chaplain Katherene Ponto

## 2017-05-14 NOTE — Progress Notes (Addendum)
Pts wife Stanton Kidney and family ready to make pt comfort care at this time. MD made aware. Comfort care orders received. Family at bedside. Chaplain paged to bedside. Compassion cart ordered. Emotional support provided to family.

## 2017-05-14 NOTE — Progress Notes (Signed)
[place] Progress Note Patient Name: [name] [name] DOB: 06-22-1951 MRN: [id number]   Date of Service  05/20/2017  HPI/Events of Note  Contacted by bedside nurse regarding family desire to transition to comfort care. Plan of care discussion by intensivist reviewed from today. Personally called the patient's wife confirming desire to transition to full comfort care. She has requested hospital chaplain to be at bedside for prayer as patient is disconnected from ventilator.   eICU Interventions  1. Withdrawal life-sustaining treatment order set has been placed 2. Plan to use fentanyl IV as needed for signs of discomfort, pain, or dyspnea 3. Hospital chaplain will be paged by bedside nurse to provide spiritual support to the family      Intervention Category Major Interventions: Other:  Tera Partridge 05/20/2017, 5:27 PM

## 2017-05-14 DEATH — deceased

## 2017-06-01 ENCOUNTER — Ambulatory Visit: Payer: Medicare Other | Admitting: Cardiovascular Disease

## 2017-06-14 NOTE — Death Summary Note (Signed)
Death Summary:   Date of Admission: 2017/05/28 Date of Death: 05/30/2017  HPI:   66 y/o M admitted 2023/05/29 after suffering an out of hospital cardiac arrest (asystole/PEA).  He required 30-40 minutes of CPR before ROSC.  The patient carries a medical history of dCHF, CAD s/p CABG, HTN, AF/Aflutter on Eliquis, CKD 3, COPD / OSA s/p tracheostomy with chronic nocturnal vent dependence and recent admit from 9/5-9/8 for new [facility] & UTI.    STUDIES:  TTE 04/19/17 >> LVEF 55-60%, Head CT 05/28/17 >> Motion degraded study without acute intracranial abnormality. Chest CT w/o contrast 2017/05/28 >> Diffuse patchy airspace disease in both lungs may indicate edema, pneumonia, or aspiration. Small bilateral pleural effusions with basilar atelectasis. Stable cardiac enlargement. Prominent mediastinal lymph nodes without change since prior study. Diffuse thyroid gland enlargement consistent with coronary, unchanged. Bilateral pleural calcification with loculated fluid collection on the left. This appearance is unchanged since previous study and may indicate prior infection or pleurodesis. Cholelithiasis. Emphysematous changes in the lungs. Aortic atherosclerosis. EEG 29-May-2023 >> EEG is markedly abnormal due to burst suppression pattern.Indicates severe diffuse cerebral dysfunction that is non-specific in etiology and can be seen in the setting of anoxic/ischemic injury, toxic/metabolic encephalopathies, or medication effect from Propofol. TTE 9/20 >> LVEF 55-60%, no RWMA, RA/RV mild-mod dilated  CULTURES: RVP 29-May-2023 >> negative  Tracheal Aspirate 9/20 >> BCID 05-29-23 >> staph species detected, methicillin resistant coag-neg detected 1/2 BCx2 2023-05-29 >> GPC's in clusters 1/2 >>  ANTIBIOTICS: Vanco 05-29-2023 >> 31-May-2023  Zosyn 05-29-23 >> 2023/05/31  Hospital Summary by Problem:  1. Cardiac arrest; Anoxic Encephalopathy: patient suffered an out of hospital cardiac arrest, with 30-79min CPR before attaining ROSC. On arrival to the ICU he was treated  with therapeutic hypothermia to 36 degrees "normothermia protocol". He remained obtunded with myoclonic activity concerning for significant anoxic encephalopathy. Patient not awakening off sedation. No cough or gag. Pupils unresponsive to light. Based on this significant brain injury, patient's family decided to transition patient to comfort care.   2. Septic shock: patient found to have septic shock in setting of UTI and Aspiration pneumonia. He was treated with broad spectrum antibiotics (Vanc and Zosyn). Blood cultures on 05-29-2023 grew MRSE.   3. Afib with RVR: developed on 2023-05-31; started Amiodarone gtt.   4. Anemia: required transfusion of 1u pRBC on 9/20 for continued oozing from central line site.   5. Acute-on-Chronic Hypoxic and Hypercapneic respiratory failure; OSA; Chronic Tracheostomy due to OSA; COPD exacerbation: patient was on chronic vent at home, maintained on vent with daily ABG's and CXR's. Treated COPD with neb treatments. On May 30, 2017 patient's family decided to transition to comfort care and ventilator support was withdrawn.   6. AKI-on-CKD: acute worsening likely due to the arrest and shock; monitored UOP and avoided nephrotoxic agents. Ensured adequate renal perfusion.

## 2018-07-09 IMAGING — CT CT HEAD W/O CM
4 of 8 series · 14 of 47 positions shown, 15 images · non-contrast
Comparison: Brain MRI 12/05/2010

CLINICAL DATA: Encephalopathy

EXAM:
CT HEAD WITHOUT CONTRAST
TECHNIQUE: Contiguous axial images were obtained from the base of the skull
through the vertex without intravenous contrast.

[Series 3: head bone · axial · 0.46mm/px · z∈[+376,+458]mm · 5 of 89 slices shown]
[im 6/89  bone]
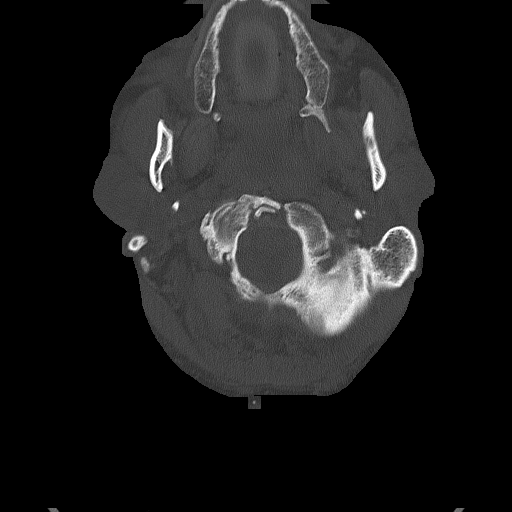
[im 18/89  bone]
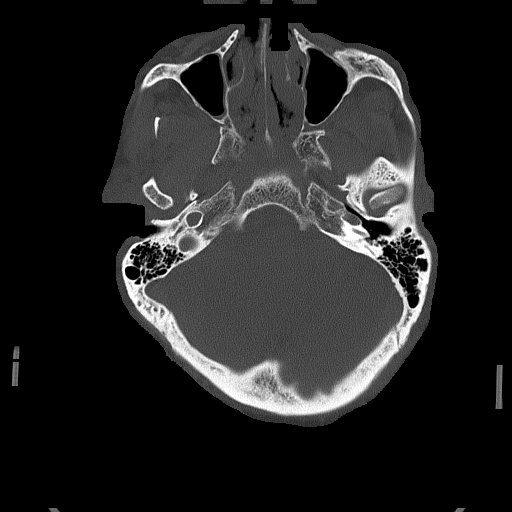
[im 30/89  bone]
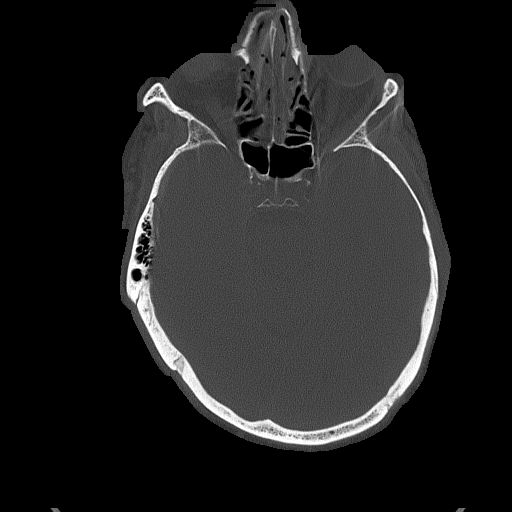
[im 42/89  bone]
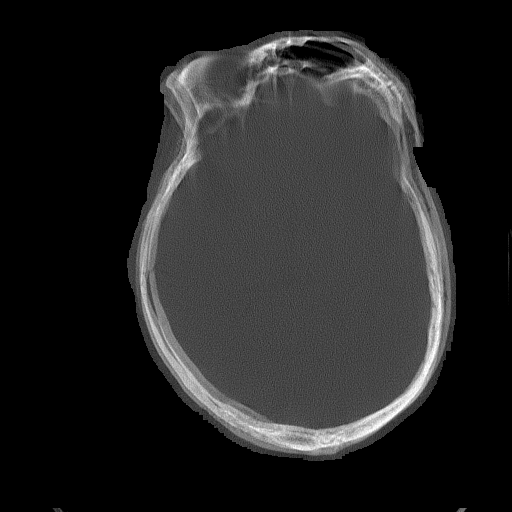
[im 47/89  bone]
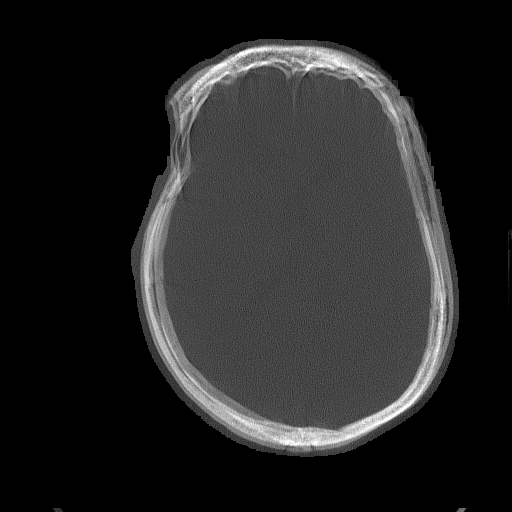

[Series 4: head without · axial · non-contrast · 0.46mm/px · z∈[+396,+501]mm · 4 of 35 slices shown, 5 images]
[im 7/35  brain]
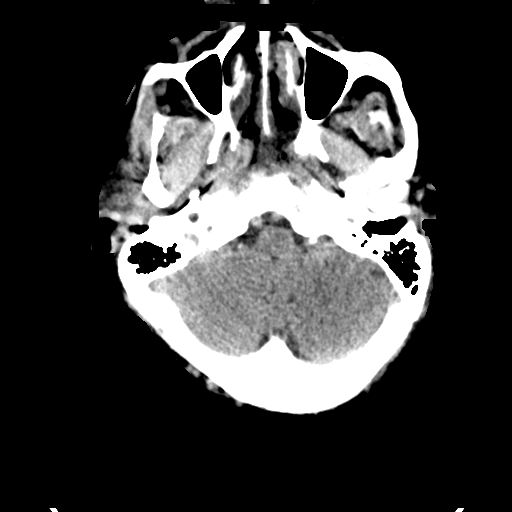
[im 7/35  bone]
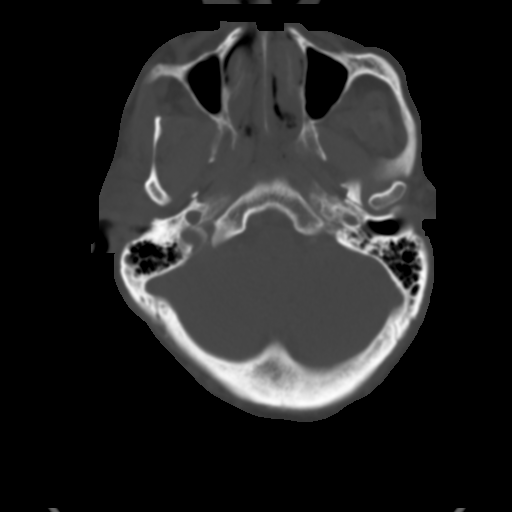
[im 14/35  brain]
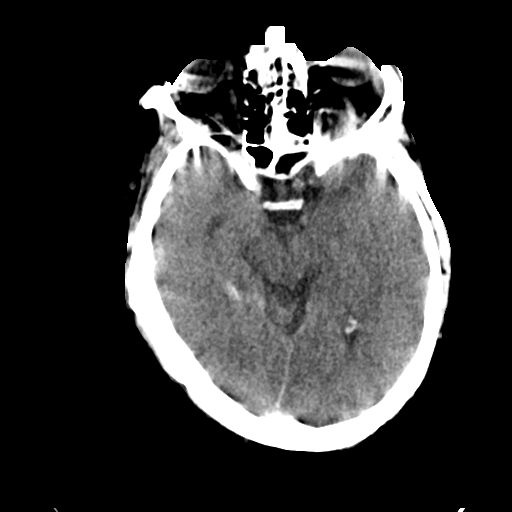
[im 21/35  brain]
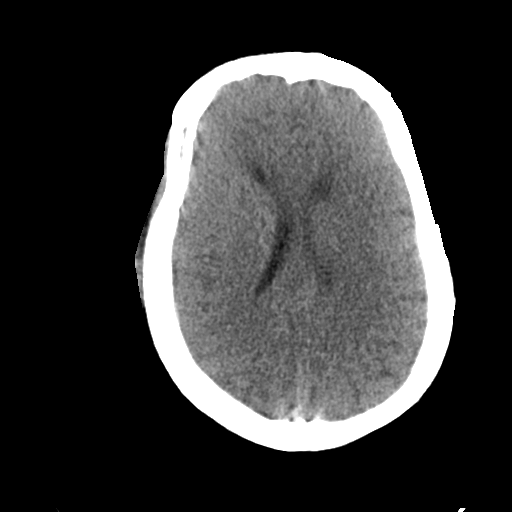
[im 28/35  brain]
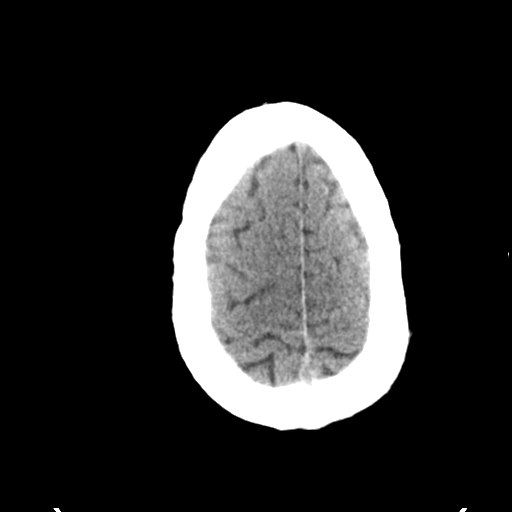

[Series 5: head without cor · coronal · non-contrast · 0.34mm/px · 3 of 75 slices shown]
[im 24/75  brain]
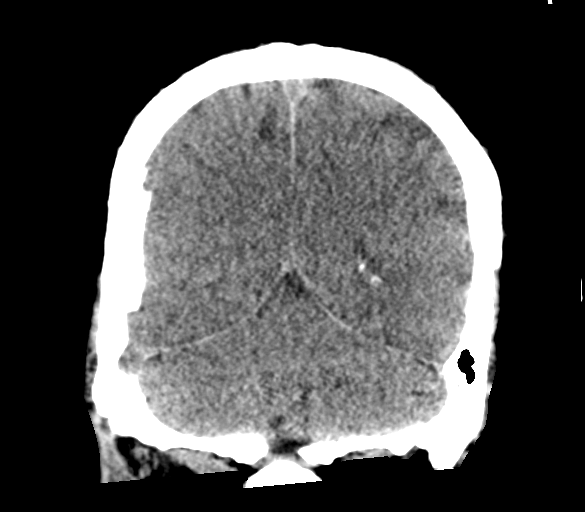
[im 36/75  brain]
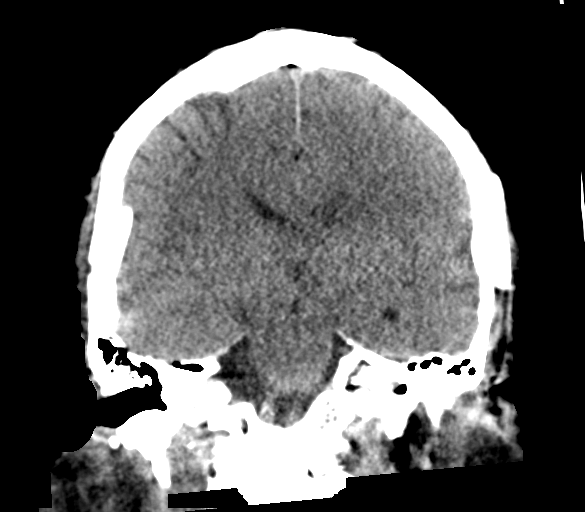
[im 48/75  brain]
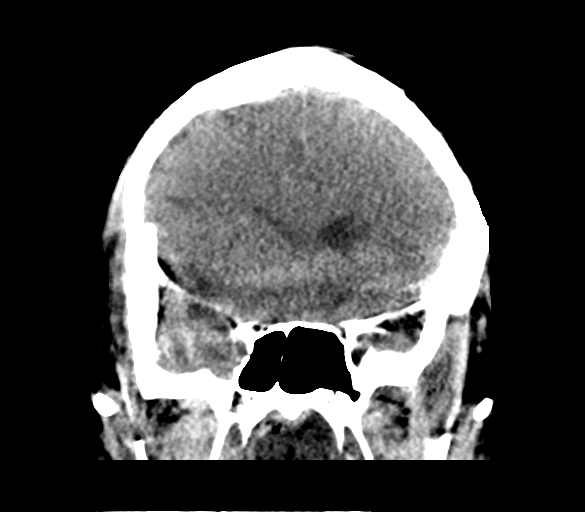

[Series 10: head without sag · sagittal · non-contrast · 0.14mm/px · 2 of 66 slices shown]
[im 22/66  brain]
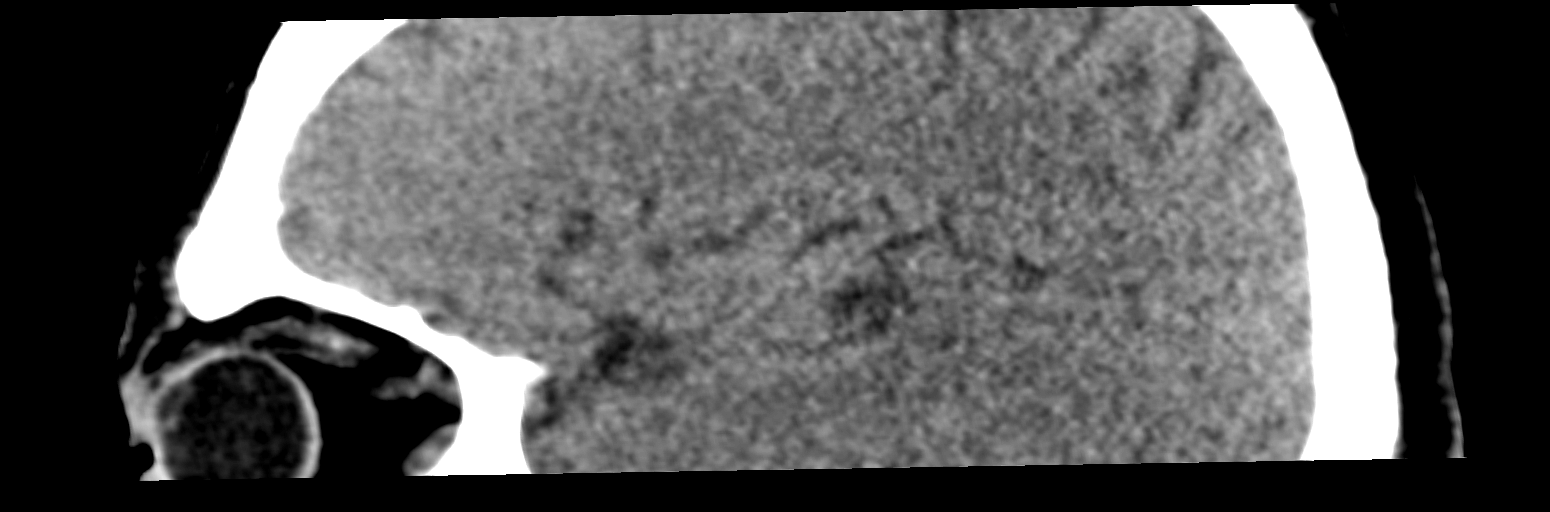
[im 44/66  brain]
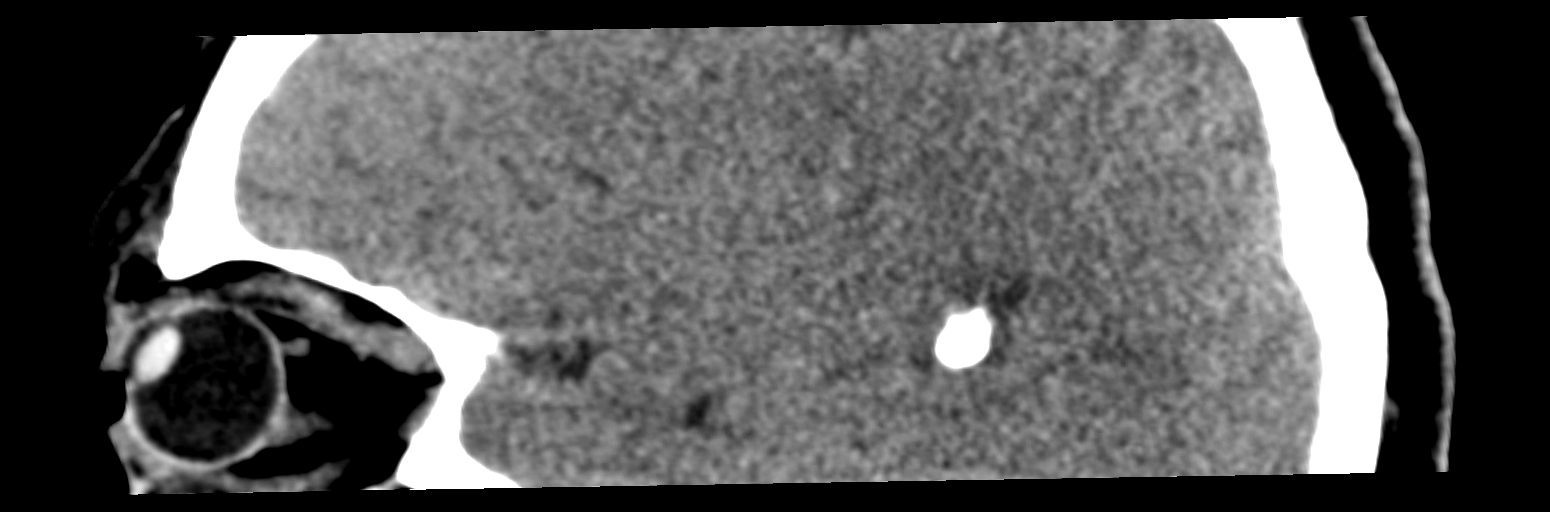

[14 of 47 positions shown; findings below may reference images not displayed]

FINDINGS: Motion degraded examination.

Brain: No mass lesion, intraparenchymal hemorrhage or extra-axial
collection. No evidence of acute cortical infarct. Brain parenchyma
and CSF-containing spaces are normal for age.

Vascular: No hyperdense vessel or unexpected calcification.

Skull: Normal visualized skull base, calvarium and extracranial soft
tissues.

Sinuses/Orbits: No sinus fluid levels or advanced mucosal
thickening. No mastoid effusion. Normal orbits.
IMPRESSION: Motion degraded study without acute intracranial abnormality.
